# Patient Record
Sex: Female | Born: 1940 | Race: White | Hispanic: No | Smoking: Former smoker
Health system: Southern US, Community
[De-identification: ages and names within clinical notes are randomized; demographics above are authoritative.]

## PROBLEM LIST (undated history)

## (undated) ENCOUNTER — Observation Stay: Admission: AD | Payer: Medicare Other | Source: Ambulatory Visit | Admitting: Hematology

## (undated) DIAGNOSIS — I6523 Occlusion and stenosis of bilateral carotid arteries: Secondary | ICD-10-CM

## (undated) DIAGNOSIS — N202 Calculus of kidney with calculus of ureter: Secondary | ICD-10-CM

## (undated) DIAGNOSIS — Z8719 Personal history of other diseases of the digestive system: Secondary | ICD-10-CM

## (undated) DIAGNOSIS — E538 Deficiency of other specified B group vitamins: Secondary | ICD-10-CM

## (undated) DIAGNOSIS — K5730 Diverticulosis of large intestine without perforation or abscess without bleeding: Secondary | ICD-10-CM

## (undated) DIAGNOSIS — D591 Other autoimmune hemolytic anemias: Secondary | ICD-10-CM

## (undated) DIAGNOSIS — J449 Chronic obstructive pulmonary disease, unspecified: Secondary | ICD-10-CM

## (undated) DIAGNOSIS — D5912 Cold autoimmune hemolytic anemia: Secondary | ICD-10-CM

## (undated) HISTORY — PX: TONSILLECTOMY: SUR1361

## (undated) HISTORY — PX: CATARACT EXTRACTION W/ INTRAOCULAR LENS IMPLANT: SHX1309

---

## 1965-09-23 HISTORY — PX: APPENDECTOMY: SHX54

## 1982-09-23 HISTORY — PX: BREAST EXCISIONAL BIOPSY: SUR124

## 1984-09-23 HISTORY — PX: ABDOMINAL HYSTERECTOMY: SHX81

## 1998-02-27 ENCOUNTER — Ambulatory Visit (HOSPITAL_COMMUNITY): Admission: RE | Admit: 1998-02-27 | Discharge: 1998-02-27 | Payer: Self-pay | Admitting: *Deleted

## 1998-03-07 ENCOUNTER — Ambulatory Visit (HOSPITAL_COMMUNITY): Admission: RE | Admit: 1998-03-07 | Discharge: 1998-03-07 | Payer: Self-pay | Admitting: Neurology

## 2000-05-27 ENCOUNTER — Emergency Department (HOSPITAL_COMMUNITY): Admission: EM | Admit: 2000-05-27 | Discharge: 2000-05-27 | Payer: Self-pay | Admitting: Emergency Medicine

## 2003-09-17 ENCOUNTER — Inpatient Hospital Stay (HOSPITAL_COMMUNITY): Admission: EM | Admit: 2003-09-17 | Discharge: 2003-09-29 | Payer: Self-pay | Admitting: Emergency Medicine

## 2003-11-23 ENCOUNTER — Ambulatory Visit (HOSPITAL_COMMUNITY): Admission: RE | Admit: 2003-11-23 | Discharge: 2003-11-23 | Payer: Self-pay | Admitting: *Deleted

## 2003-11-23 ENCOUNTER — Encounter (INDEPENDENT_AMBULATORY_CARE_PROVIDER_SITE_OTHER): Payer: Self-pay | Admitting: Specialist

## 2004-02-24 ENCOUNTER — Ambulatory Visit (HOSPITAL_COMMUNITY): Admission: RE | Admit: 2004-02-24 | Discharge: 2004-02-24 | Payer: Self-pay | Admitting: *Deleted

## 2005-02-15 ENCOUNTER — Ambulatory Visit: Payer: Self-pay | Admitting: Oncology

## 2005-09-24 ENCOUNTER — Ambulatory Visit: Payer: Self-pay | Admitting: Oncology

## 2005-10-01 ENCOUNTER — Ambulatory Visit (HOSPITAL_COMMUNITY): Admission: RE | Admit: 2005-10-01 | Discharge: 2005-10-01 | Payer: Self-pay | Admitting: Oncology

## 2006-03-19 ENCOUNTER — Ambulatory Visit: Payer: Self-pay | Admitting: Oncology

## 2006-05-09 ENCOUNTER — Ambulatory Visit: Payer: Self-pay | Admitting: Oncology

## 2006-05-16 LAB — CBC WITH DIFFERENTIAL/PLATELET
Basophils Absolute: 0.1 10*3/uL (ref 0.0–0.1)
EOS%: 0.6 % (ref 0.0–7.0)
HCT: 33.5 % — ABNORMAL LOW (ref 34.8–46.6)
HGB: 11.7 g/dL (ref 11.6–15.9)
MCH: 36.7 pg — ABNORMAL HIGH (ref 26.0–34.0)
MCV: 104.9 fL — ABNORMAL HIGH (ref 81.0–101.0)
NEUT%: 49.9 % (ref 39.6–76.8)
Platelets: 354 10*3/uL (ref 145–400)
lymph#: 3.4 10*3/uL — ABNORMAL HIGH (ref 0.9–3.3)

## 2006-05-16 LAB — COMPREHENSIVE METABOLIC PANEL
CO2: 29 mEq/L (ref 19–32)
Creatinine, Ser: 0.47 mg/dL (ref 0.40–1.20)
Glucose, Bld: 94 mg/dL (ref 70–99)
Total Bilirubin: 2.5 mg/dL — ABNORMAL HIGH (ref 0.3–1.2)

## 2006-05-16 LAB — IRON AND TIBC
%SAT: 83 % — ABNORMAL HIGH (ref 20–55)
Iron: 311 ug/dL — ABNORMAL HIGH (ref 42–145)
TIBC: 376 ug/dL (ref 250–470)
UIBC: 65 ug/dL

## 2006-05-16 LAB — MORPHOLOGY

## 2006-05-16 LAB — LACTATE DEHYDROGENASE: LDH: 240 U/L (ref 94–250)

## 2007-05-31 ENCOUNTER — Emergency Department (HOSPITAL_COMMUNITY): Admission: EM | Admit: 2007-05-31 | Discharge: 2007-05-31 | Payer: Self-pay | Admitting: Emergency Medicine

## 2007-06-03 ENCOUNTER — Emergency Department (HOSPITAL_COMMUNITY): Admission: EM | Admit: 2007-06-03 | Discharge: 2007-06-03 | Payer: Self-pay | Admitting: Emergency Medicine

## 2007-06-19 ENCOUNTER — Emergency Department (HOSPITAL_COMMUNITY): Admission: EM | Admit: 2007-06-19 | Discharge: 2007-06-19 | Payer: Self-pay | Admitting: Emergency Medicine

## 2007-06-24 ENCOUNTER — Ambulatory Visit: Payer: Self-pay | Admitting: Oncology

## 2007-06-26 LAB — CBC WITH DIFFERENTIAL/PLATELET
Basophils Absolute: 0.2 10*3/uL — ABNORMAL HIGH (ref 0.0–0.1)
Eosinophils Absolute: 0 10*3/uL (ref 0.0–0.5)
HGB: 11.1 g/dL — ABNORMAL LOW (ref 11.6–15.9)
NEUT#: 5.7 10*3/uL (ref 1.5–6.5)
RDW: UNDETERMINED % (ref 11.3–14.5)
WBC: 11.3 10*3/uL — ABNORMAL HIGH (ref 3.9–10.0)
lymph#: 4.9 10*3/uL — ABNORMAL HIGH (ref 0.9–3.3)

## 2007-06-26 LAB — COMPREHENSIVE METABOLIC PANEL
ALT: 14 U/L (ref 0–35)
AST: 26 U/L (ref 0–37)
Albumin: 4.7 g/dL (ref 3.5–5.2)
Alkaline Phosphatase: 106 U/L (ref 39–117)
Calcium: 9 mg/dL (ref 8.4–10.5)
Chloride: 102 mEq/L (ref 96–112)
Creatinine, Ser: 0.5 mg/dL (ref 0.40–1.20)
Potassium: 4.4 mEq/L (ref 3.5–5.3)

## 2007-06-26 LAB — MORPHOLOGY: PLT EST: INCREASED

## 2007-07-14 ENCOUNTER — Encounter: Admission: RE | Admit: 2007-07-14 | Discharge: 2007-07-14 | Payer: Self-pay | Admitting: Oncology

## 2007-09-30 ENCOUNTER — Ambulatory Visit (HOSPITAL_COMMUNITY): Admission: RE | Admit: 2007-09-30 | Discharge: 2007-09-30 | Payer: Self-pay | Admitting: Oncology

## 2007-10-12 ENCOUNTER — Ambulatory Visit: Payer: Self-pay | Admitting: Oncology

## 2007-10-14 LAB — CBC WITH DIFFERENTIAL/PLATELET
BASO%: 1.8 % (ref 0.0–2.0)
EOS%: 0.6 % (ref 0.0–7.0)
HCT: UNDETERMINED % (ref 34.8–46.6)
MCH: UNDETERMINED pg (ref 26.0–34.0)
MCHC: UNDETERMINED g/dL (ref 32.0–36.0)
MCV: UNDETERMINED fL (ref 81.0–101.0)
MONO%: 5.5 % (ref 0.0–13.0)
NEUT%: 55 % (ref 39.6–76.8)
lymph#: 3.4 10*3/uL — ABNORMAL HIGH (ref 0.9–3.3)

## 2008-01-08 ENCOUNTER — Ambulatory Visit: Payer: Self-pay | Admitting: Oncology

## 2008-04-01 ENCOUNTER — Ambulatory Visit: Payer: Self-pay | Admitting: Oncology

## 2008-06-29 ENCOUNTER — Ambulatory Visit: Payer: Self-pay | Admitting: Oncology

## 2008-07-01 LAB — COMPREHENSIVE METABOLIC PANEL
AST: 25 U/L (ref 0–37)
Alkaline Phosphatase: 115 U/L (ref 39–117)
BUN: 16 mg/dL (ref 6–23)
Calcium: 8.5 mg/dL (ref 8.4–10.5)
Creatinine, Ser: 0.51 mg/dL (ref 0.40–1.20)
Total Bilirubin: 1.6 mg/dL — ABNORMAL HIGH (ref 0.3–1.2)

## 2008-07-01 LAB — MORPHOLOGY

## 2008-07-01 LAB — CBC WITH DIFFERENTIAL/PLATELET
BASO%: 1.7 % (ref 0.0–2.0)
Eosinophils Absolute: 0.1 10*3/uL (ref 0.0–0.5)
MCHC: UNDETERMINED g/dL (ref 32.0–36.0)
MONO#: 0.3 10*3/uL (ref 0.1–0.9)
NEUT#: 4.1 10*3/uL (ref 1.5–6.5)
RBC: UNDETERMINED 10*6/uL (ref 3.70–5.32)
WBC: 8.1 10*3/uL (ref 3.9–10.0)
lymph#: 3.5 10*3/uL — ABNORMAL HIGH (ref 0.9–3.3)

## 2008-07-15 ENCOUNTER — Encounter: Admission: RE | Admit: 2008-07-15 | Discharge: 2008-07-15 | Payer: Self-pay | Admitting: Oncology

## 2008-08-15 ENCOUNTER — Ambulatory Visit: Payer: Self-pay | Admitting: Oncology

## 2008-08-17 LAB — CBC WITH DIFFERENTIAL/PLATELET
BASO%: 1.1 % (ref 0.0–2.0)
LYMPH%: 34 % (ref 14.0–48.0)
MCHC: 36.8 g/dL — ABNORMAL HIGH (ref 32.0–36.0)
MONO#: 0.5 10*3/uL (ref 0.1–0.9)
MONO%: 5.9 % (ref 0.0–13.0)
Platelets: 473 10*3/uL — ABNORMAL HIGH (ref 145–400)
RBC: 2.88 10*6/uL — ABNORMAL LOW (ref 3.70–5.32)
RDW: 13.4 % (ref 11.3–14.5)
WBC: 8.3 10*3/uL (ref 3.9–10.0)

## 2009-02-10 ENCOUNTER — Ambulatory Visit: Payer: Self-pay | Admitting: Oncology

## 2009-02-14 LAB — CBC WITH DIFFERENTIAL/PLATELET
BASO%: 0.5 % (ref 0.0–2.0)
Eosinophils Absolute: 0 10*3/uL (ref 0.0–0.5)
HCT: UNDETERMINED % (ref 34.8–46.6)
LYMPH%: 28.4 % (ref 14.0–49.7)
MONO#: 0.3 10*3/uL (ref 0.1–0.9)
NEUT#: 8.6 10*3/uL — ABNORMAL HIGH (ref 1.5–6.5)
Platelets: ADEQUATE 10*3/uL (ref 145–400)
RBC: UNDETERMINED 10*6/uL (ref 3.70–5.45)
WBC: 12.6 10*3/uL — ABNORMAL HIGH (ref 3.9–10.3)
lymph#: 3.6 10*3/uL — ABNORMAL HIGH (ref 0.9–3.3)
nRBC: 0 % (ref 0–0)

## 2009-02-21 ENCOUNTER — Encounter: Admission: RE | Admit: 2009-02-21 | Discharge: 2009-02-21 | Payer: Self-pay | Admitting: *Deleted

## 2009-02-28 ENCOUNTER — Inpatient Hospital Stay (HOSPITAL_COMMUNITY): Admission: AD | Admit: 2009-02-28 | Discharge: 2009-03-04 | Payer: Self-pay | Admitting: *Deleted

## 2009-03-01 ENCOUNTER — Ambulatory Visit: Payer: Self-pay | Admitting: Oncology

## 2009-03-10 LAB — CBC & DIFF AND RETIC
BASO%: 1.2 % (ref 0.0–2.0)
EOS%: 0.7 % (ref 0.0–7.0)
HCT: UNDETERMINED % (ref 34.8–46.6)
LYMPH%: 34 % (ref 14.0–49.7)
MCH: UNDETERMINED pg (ref 25.1–34.0)
MCHC: UNDETERMINED g/dL (ref 31.5–36.0)
MCV: UNDETERMINED fL (ref 79.5–101.0)
MONO%: 5.4 % (ref 0.0–14.0)
NEUT%: 58.7 % (ref 38.4–76.8)
Platelets: 572 10*3/uL — ABNORMAL HIGH (ref 145–400)

## 2009-03-17 LAB — CBC & DIFF AND RETIC
Eosinophils Absolute: 0.1 10*3/uL (ref 0.0–0.5)
HCT: 27.2 % — ABNORMAL LOW (ref 34.8–46.6)
IRF: 0.35 — ABNORMAL HIGH (ref 0.130–0.330)
LYMPH%: 52.4 % — ABNORMAL HIGH (ref 14.0–49.7)
MONO#: 0.4 10*3/uL (ref 0.1–0.9)
NEUT#: 2.7 10*3/uL (ref 1.5–6.5)
NEUT%: 39.2 % (ref 38.4–76.8)
Platelets: 448 10*3/uL — ABNORMAL HIGH (ref 145–400)
Retic %: 1.7 % (ref 0.4–2.3)
WBC: 7 10*3/uL (ref 3.9–10.3)
lymph#: 3.7 10*3/uL — ABNORMAL HIGH (ref 0.9–3.3)

## 2009-04-06 ENCOUNTER — Ambulatory Visit: Payer: Self-pay | Admitting: Oncology

## 2009-04-10 LAB — CBC & DIFF AND RETIC
BASO%: 0.5 % (ref 0.0–2.0)
Immature Retic Fract: 9.5 % (ref 0.00–10.70)
LYMPH%: 38.4 % (ref 14.0–49.7)
MCHC: 35.7 g/dL (ref 31.5–36.0)
MONO#: 0.6 10*3/uL (ref 0.1–0.9)
Platelets: 390 10*3/uL (ref 145–400)
RBC: 2.56 10*6/uL — ABNORMAL LOW (ref 3.70–5.45)
WBC: 8.2 10*3/uL (ref 3.9–10.3)
nRBC: 0 % (ref 0–0)

## 2009-04-10 LAB — MORPHOLOGY

## 2009-04-11 ENCOUNTER — Ambulatory Visit: Admission: RE | Admit: 2009-04-11 | Discharge: 2009-04-11 | Payer: Self-pay | Admitting: Oncology

## 2009-04-11 ENCOUNTER — Ambulatory Visit: Payer: Self-pay | Admitting: Vascular Surgery

## 2009-04-11 ENCOUNTER — Encounter: Payer: Self-pay | Admitting: Oncology

## 2009-05-22 ENCOUNTER — Ambulatory Visit: Payer: Self-pay | Admitting: Oncology

## 2009-05-24 LAB — RETICULOCYTES (CHCC)
ABS Retic: 86.8 10*3/uL (ref 19.0–186.0)
RBC.: 2.8 MIL/uL — ABNORMAL LOW (ref 3.87–5.11)
Retic Ct Pct: 3.1 % (ref 0.4–3.1)

## 2009-05-24 LAB — CBC & DIFF AND RETIC
Basophils Absolute: 0.1 10*3/uL (ref 0.0–0.1)
EOS%: 0.5 % (ref 0.0–7.0)
Eosinophils Absolute: 0.1 10*3/uL (ref 0.0–0.5)
HGB: 9.9 g/dL — ABNORMAL LOW (ref 11.6–15.9)
NEUT#: 6.1 10*3/uL (ref 1.5–6.5)
RBC: UNDETERMINED 10*6/uL (ref 3.70–5.45)
RDW: UNDETERMINED % (ref 11.2–14.5)
lymph#: 4.3 10*3/uL — ABNORMAL HIGH (ref 0.9–3.3)

## 2009-06-28 ENCOUNTER — Ambulatory Visit: Payer: Self-pay | Admitting: Oncology

## 2009-06-30 LAB — CBC & DIFF AND RETIC
Basophils Absolute: 0.1 10*3/uL (ref 0.0–0.1)
Eosinophils Absolute: 0.1 10*3/uL (ref 0.0–0.5)
HCT: 28.6 % — ABNORMAL LOW (ref 34.8–46.6)
HGB: 10.2 g/dL — ABNORMAL LOW (ref 11.6–15.9)
Immature Retic Fract: 9.9 % (ref 0.00–10.70)
MCH: 39.7 pg — ABNORMAL HIGH (ref 25.1–34.0)
MONO#: 0.7 10*3/uL (ref 0.1–0.9)
NEUT#: 3.7 10*3/uL (ref 1.5–6.5)
NEUT%: 46.3 % (ref 38.4–76.8)
Retic %: 3.35 % — ABNORMAL HIGH (ref 0.50–1.50)
Retic Ct Abs: 86.1 10*3/uL — ABNORMAL HIGH (ref 18.30–72.70)
WBC: 8 10*3/uL (ref 3.9–10.3)
lymph#: 3.5 10*3/uL — ABNORMAL HIGH (ref 0.9–3.3)

## 2009-07-17 ENCOUNTER — Encounter: Admission: RE | Admit: 2009-07-17 | Discharge: 2009-07-17 | Payer: Self-pay | Admitting: Oncology

## 2009-10-27 ENCOUNTER — Ambulatory Visit: Payer: Self-pay | Admitting: Oncology

## 2009-10-31 LAB — CBC WITH DIFFERENTIAL/PLATELET
Basophils Absolute: 0.1 10*3/uL (ref 0.0–0.1)
EOS%: 0.8 % (ref 0.0–7.0)
HCT: 27.6 % — ABNORMAL LOW (ref 34.8–46.6)
HGB: 10 g/dL — ABNORMAL LOW (ref 11.6–15.9)
LYMPH%: 35.5 % (ref 14.0–49.7)
MCH: 39.5 pg — ABNORMAL HIGH (ref 25.1–34.0)
MCHC: 36.2 g/dL — ABNORMAL HIGH (ref 31.5–36.0)
MCV: 109.3 fL — ABNORMAL HIGH (ref 79.5–101.0)
MONO%: 5.4 % (ref 0.0–14.0)
NEUT%: 57.3 % (ref 38.4–76.8)
Platelets: 398 10*3/uL (ref 145–400)
lymph#: 3.2 10*3/uL (ref 0.9–3.3)

## 2009-10-31 LAB — COMPREHENSIVE METABOLIC PANEL
AST: 27 U/L (ref 0–37)
BUN: 13 mg/dL (ref 6–23)
Calcium: 9.1 mg/dL (ref 8.4–10.5)
Chloride: 99 mEq/L (ref 96–112)
Creatinine, Ser: 0.52 mg/dL (ref 0.40–1.20)
Total Bilirubin: 2.8 mg/dL — ABNORMAL HIGH (ref 0.3–1.2)

## 2010-04-30 ENCOUNTER — Ambulatory Visit: Payer: Self-pay | Admitting: Oncology

## 2010-05-02 LAB — CBC WITH DIFFERENTIAL/PLATELET
Eosinophils Absolute: 0.1 10*3/uL (ref 0.0–0.5)
HCT: 31.8 % — ABNORMAL LOW (ref 34.8–46.6)
LYMPH%: 40.6 % (ref 14.0–49.7)
MCV: 103.2 fL — ABNORMAL HIGH (ref 79.5–101.0)
MONO#: 0.8 10*3/uL (ref 0.1–0.9)
MONO%: 8.3 % (ref 0.0–14.0)
NEUT#: 4.8 10*3/uL (ref 1.5–6.5)
NEUT%: 49.9 % (ref 38.4–76.8)
Platelets: 369 10*3/uL (ref 145–400)
WBC: 9.6 10*3/uL (ref 3.9–10.3)

## 2010-07-23 ENCOUNTER — Encounter: Admission: RE | Admit: 2010-07-23 | Discharge: 2010-07-23 | Payer: Self-pay | Admitting: Oncology

## 2010-10-12 ENCOUNTER — Other Ambulatory Visit: Payer: Self-pay

## 2010-11-09 ENCOUNTER — Encounter (HOSPITAL_BASED_OUTPATIENT_CLINIC_OR_DEPARTMENT_OTHER): Payer: Medicare Other | Admitting: Oncology

## 2010-11-09 ENCOUNTER — Other Ambulatory Visit: Payer: Self-pay | Admitting: Oncology

## 2010-11-09 DIAGNOSIS — K8020 Calculus of gallbladder without cholecystitis without obstruction: Secondary | ICD-10-CM

## 2010-11-09 DIAGNOSIS — D649 Anemia, unspecified: Secondary | ICD-10-CM

## 2010-11-09 DIAGNOSIS — D591 Autoimmune hemolytic anemia, unspecified: Secondary | ICD-10-CM

## 2010-11-09 LAB — CBC WITH DIFFERENTIAL/PLATELET
BASO%: 1 % (ref 0.0–2.0)
HCT: UNDETERMINED % (ref 34.8–46.6)
LYMPH%: 41.9 % (ref 14.0–49.7)
MCHC: UNDETERMINED g/dL (ref 31.5–36.0)
MCV: UNDETERMINED fL (ref 79.5–101.0)
MONO#: 0.5 10*3/uL (ref 0.1–0.9)
MONO%: 6.9 % (ref 0.0–14.0)
NEUT%: 49.5 % (ref 38.4–76.8)
Platelets: 420 10*3/uL — ABNORMAL HIGH (ref 145–400)
RBC: UNDETERMINED 10*6/uL (ref 3.70–5.45)

## 2010-11-09 LAB — COMPREHENSIVE METABOLIC PANEL
ALT: 16 U/L (ref 0–35)
Alkaline Phosphatase: 124 U/L — ABNORMAL HIGH (ref 39–117)
CO2: 30 mEq/L (ref 19–32)
Creatinine, Ser: 0.49 mg/dL (ref 0.40–1.20)
Glucose, Bld: 96 mg/dL (ref 70–99)
Sodium: 140 mEq/L (ref 135–145)
Total Bilirubin: 2.4 mg/dL — ABNORMAL HIGH (ref 0.3–1.2)
Total Protein: 6.3 g/dL (ref 6.0–8.3)

## 2010-12-31 LAB — BASIC METABOLIC PANEL
BUN: 3 mg/dL — ABNORMAL LOW (ref 6–23)
CO2: 28 mEq/L (ref 19–32)
CO2: 28 mEq/L (ref 19–32)
Calcium: 7.4 mg/dL — ABNORMAL LOW (ref 8.4–10.5)
GFR calc Af Amer: >60 mL/min (ref >60)
GFR calc non Af Amer: >60 mL/min (ref >60)
GFR calc non Af Amer: >60 mL/min (ref >60)
Glucose, Bld: 89 mg/dL (ref 70–99)
Potassium: 3.3 mEq/L — ABNORMAL LOW (ref 3.5–5.1)
Sodium: 135 mEq/L (ref 135–145)
Sodium: 137 mEq/L (ref 135–145)

## 2010-12-31 LAB — CROSSMATCH: ABO/RH(D): B POS

## 2010-12-31 LAB — HEPATIC FUNCTION PANEL
ALT: 10 U/L (ref 0–35)
AST: 20 U/L (ref 0–37)
Albumin: 2.9 g/dL — ABNORMAL LOW (ref 3.5–5.2)
Alkaline Phosphatase: 77 U/L (ref 39–117)
Total Bilirubin: 2.3 mg/dL — ABNORMAL HIGH (ref 0.3–1.2)
Total Protein: 5.5 g/dL — ABNORMAL LOW (ref 6.0–8.3)

## 2010-12-31 LAB — CBC
HCT: 12.6 % — ABNORMAL LOW (ref 36.0–46.0)
HCT: 18 % — ABNORMAL LOW (ref 36.0–46.0)
HCT: 18.8 % — ABNORMAL LOW (ref 36.0–46.0)
Hemoglobin: 4.8 g/dL — CL (ref 12.0–15.0)
Hemoglobin: 7.5 g/dL — CL (ref 12.0–15.0)
Hemoglobin: 8.3 g/dL — ABNORMAL LOW (ref 12.0–15.0)
MCHC: 34.3 g/dL (ref 30.0–36.0)
MCHC: 35.7 g/dL (ref 30.0–36.0)
MCV: 110.5 fL — ABNORMAL HIGH (ref 78.0–100.0)
Platelets: 580 10*3/uL — ABNORMAL HIGH (ref 150–400)
Platelets: 638 10*3/uL — ABNORMAL HIGH (ref 150–400)
RBC: 1.04 MIL/uL — ABNORMAL LOW (ref 3.87–5.11)
RBC: 1.55 MIL/uL — ABNORMAL LOW (ref 3.87–5.11)
RBC: 1.7 MIL/uL — ABNORMAL LOW (ref 3.87–5.11)
RBC: 2.1 MIL/uL — ABNORMAL LOW (ref 3.87–5.11)
RDW: 27.6 % — ABNORMAL HIGH (ref 11.5–15.5)
WBC: 15.1 10*3/uL — ABNORMAL HIGH (ref 4.0–10.5)
WBC: 15.9 10*3/uL — ABNORMAL HIGH (ref 4.0–10.5)

## 2010-12-31 LAB — COMPREHENSIVE METABOLIC PANEL
ALT: 9 U/L (ref 0–35)
AST: 14 U/L (ref 0–37)
Alkaline Phosphatase: 70 U/L (ref 39–117)
Alkaline Phosphatase: 82 U/L (ref 39–117)
BUN: 4 mg/dL — ABNORMAL LOW (ref 6–23)
CO2: 28 mEq/L (ref 19–32)
CO2: 34 mEq/L — ABNORMAL HIGH (ref 19–32)
Calcium: 8.2 mg/dL — ABNORMAL LOW (ref 8.4–10.5)
Chloride: 106 mEq/L (ref 96–112)
Creatinine, Ser: 0.53 mg/dL (ref 0.4–1.2)
GFR calc non Af Amer: >60 mL/min (ref >60)
GFR calc non Af Amer: >60 mL/min (ref >60)
Glucose, Bld: 96 mg/dL (ref 70–99)
Potassium: 2.2 mEq/L — CL (ref 3.5–5.1)
Potassium: 3.7 mEq/L (ref 3.5–5.1)
Sodium: 136 mEq/L (ref 135–145)
Total Bilirubin: 1.3 mg/dL — ABNORMAL HIGH (ref 0.3–1.2)

## 2010-12-31 LAB — CULTURE, BLOOD (ROUTINE X 2)

## 2010-12-31 LAB — IRON AND TIBC: UIBC: <55 ug/dL

## 2010-12-31 LAB — TSH: TSH: 2.55 u[IU]/mL (ref 0.350–4.500)

## 2010-12-31 LAB — BILIRUBIN, FRACTIONATED(TOT/DIR/INDIR): Indirect Bilirubin: 1.3 mg/dL — ABNORMAL HIGH (ref 0.3–0.9)

## 2010-12-31 LAB — HEMOCCULT GUIAC POC 1CARD (OFFICE): Fecal Occult Bld: NEGATIVE

## 2010-12-31 LAB — RETICULOCYTES: Retic Count, Absolute: 99.8 10*3/uL (ref 19.0–186.0)

## 2010-12-31 LAB — PROTIME-INR
INR: 1.3 (ref 0.00–1.49)
Prothrombin Time: 16.3 seconds — ABNORMAL HIGH (ref 11.6–15.2)

## 2010-12-31 LAB — DIFFERENTIAL
Basophils Absolute: 0 10*3/uL (ref 0.0–0.1)
Lymphocytes Relative: 30 % (ref 12–46)
Monocytes Relative: 8 % (ref 3–12)

## 2011-02-05 NOTE — H&P (Signed)
[name], [name]          ACCOUNT NO.:  [id number]   MEDICAL RECORD NO.:  [id number]          PATIENT TYPE:  INP   LOCATION:  1510                         FACILITY:  [facility]   PHYSICIAN:  Zannie Cove, MD     DATE OF BIRTH:  09-26-1940   DATE OF ADMISSION:  02/28/2009  DATE OF DISCHARGE:                              HISTORY & PHYSICAL   PRIMARY CARE PHYSICIAN:  Soyla Murphy. Renne Crigler, MD.   GASTROENTEROLOGY PHYSICIAN:  Georgiana Spinner, MD.   HEMATOLOGIST:  Juanita Craver. Darrold Span, MD.   See the history and physical.   CHIEF COMPLAINT:  Sent from the GI doctor's office due to severe anemia.   HISTORY OF PRESENTING ILLNESS:  [name] is a 70 year old female,  who was diagnosed with diverticulitis on June 1st when she initially  presented with left lower quadrant pain, had been treated with oral  antibiotics, ciprofloxacin and Flagyl, by Dr. Virginia Rochester following which her  symptoms improved significantly and almost completely resolved to the  point that she has been tolerating and eating solid food now.  This is  despite having found 2 small diverticular abscesses in addition to  diverticulitis on the initial CAT scan on February 21, 2009.  She denies any  fevers or chills.  Today, Dr. Sabino Gasser got the results of the lab  tests, which showed a hemoglobin of 6.7 and platelets of greater than  1,000,000 and, hence, referred the patient to the hospital as a direct  admit.   Patient denies any bleeding, rectally or vaginally, denies any  hematemesis.  She reports tiredness since Friday, but denies chest pain,  shortness of breath, dizziness, and no new medications except for the  antibiotics started recently.   PAST MEDICAL HISTORY:  1. Diverticulosis.  2. Cold hemoagglutinin disease.  3. Diverticulitis and diverticular abscesses status post drainage in      the past.   PAST SURGICAL HISTORY:  1. Hysterectomy.  2. Appendectomy.   MEDICATIONS:  1. Ciprofloxacin 500 twice a day.  2.  Folic acid 1 mg once a day.  3. Flagyl 500 twice a day.   ALLERGIES:  She is INTOLERANT TO IBUPROFEN.   SOCIAL HISTORY:  Divorced, lives at home by herself, is a smoker, smokes  1 pack-per-day for the past 50 years, no alcohol history.   FAMILY HISTORY:  No GI cancers or polyps.  No hematological  malignancies.   REVIEW OF SYSTEMS:  Twelve systems reviewed and negative except per HPI.   PHYSICAL EXAMINATION:  VITAL SIGNS:  Temp is 99.0, pulse is 88, blood  pressure 97/60, respirations 16, sating 99% on room air.  GENERAL:  She is alert, awake, oriented x3 in no acute distress.  HEENT:  Pupils are equal and reactive to light, some mid skin pallor  present, extraocular movements intact.  NECK:  No JVD or lymphadenopathy appreciated.  CARDIOVASCULAR:  S1 S2, regular rate, rhythm.  LUNGS:  Clear to auscultation bilaterally.  ABDOMEN:  Soft and nontender with positive bowel sounds.  No rigidity or  rebound, no organomegaly.  EXTREMITIES:  No edema, clubbing, cyanosis.   LABORATORY DATA:  Pending awaiting the fax from Dr. Pricilla Loveless office.  CT abdomen and pelvis from February 21, 2009, shows sigmoid diverticulosis  and diverticulitis with multiple developing abscesses.   ASSESSMENT AND PLAN:  A 70 year old female with:  1. Resolving diverticulitis.  Continue ciprofloxacin and metronidazole      for a total of 7 days.  A clear liquid diet for now, advance as      tolerated.  Would repeat a computerized tomography (CT) abdomen and      pelvis to reevaluate the status of the colon as well as evaluate      the abscesses.  If still persistent or larger, may require      surgical/drainage by Interventional Radiology.  Intravenous (IV)      fluids normal saline at 75 mL an hour with a blood warmer.  2. Anemia.  Likely secondary to cold hemagglutinin disease worsened by      recent inflammation.  Will type and cross and transfuse 2 units      packed red blood cells with a blood warmer.   Continue Foley      catheter at 1 mg per day.  Check a peripheral smear.  Check an      hemolysis panel.  Dr. Darrold Span has been consulted.  3. Thrombocytosis.  Likely reactive.  Repeat CBC and check peripheral      smear to confirm the same.  4. Deep venous thrombosis (DVT) prophylaxis with Lovenox 40 mg      subcutaneously daily.  5. Code Status.  Patient is a full code.      Zannie Cove, MD  Electronically Signed     PJ/MEDQ  D:  02/28/2009  T:  02/28/2009  Job:  [id number]   cc:   Soyla Murphy. Renne Crigler, M.D.  Fax: [phone]   Lennis P. Darrold Span, M.D.  Fax: [phone]

## 2011-02-05 NOTE — Discharge Summary (Signed)
[name], [name]          ACCOUNT NO.:  [id number]   MEDICAL RECORD NO.:  [id number]          PATIENT TYPE:  INP   LOCATION:  1510                         [facility]   PHYSICIAN:  Michelene Gardener, MD    DATE OF BIRTH:  Dec 26, 1940   DATE OF ADMISSION:  02/28/2009  DATE OF DISCHARGE:  03/04/2009                               DISCHARGE SUMMARY   DISCHARGE DIAGNOSES:  1. 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  2. Anemia with cold hemagglutinin disease.  3. Reactive thrombocytosis.  4. Leukocytosis secondary to diverticulitis.  5. Mild hypokalemia.   DISCHARGE MEDICATIONS:  1. Ciprofloxacin 500 mg p.o. twice daily for 2 weeks.  2. Flagyl 500 mg three times daily for 2 weeks.  3. Folic acid 1 mg once a day.   CONSULTATIONS:  1. GI consult.  2. Surgical consult.  3. Interventional radiology consult.  4. Hematology consult.   PROCEDURES:  None.   RADIOLOGY STUDIES:  1. CT scan of the abdomen with contrast on June 20 showed no acute      abdominal findings.  2. CT scan of the pelvis with contrast on June 10 showed persistent      diverticulitis and enlarging diverticular abscess.  3. CT scan of the pelvis without contrast on June 10 showed no      drainable abscess identified in the pelvis.   FOLLOWUP:  1. Soyla Murphy. Renne Crigler, M.D. in 1-2 weeks.  2. Georgiana Spinner, M.D. in 1-2 weeks.   HOSPITAL COURSE:  1. Diverticulitis with diverticular abscess.  This patient was      admitted to the hospital for further evaluation.  CT scan of the      abdomen and pelvis was done June 9 and showed findings consistent      with diverticulitis and diverticular abscess.  The patient was      evaluated by Dr. Sabino Gasser, by general surgery and by      interventional radiology.  Decision was to do a CT guided      aspiration of the diverticular abscess.  Meanwhile the patient was      kept on ciprofloxacin and Flagyl IV.  She was kept n.p.o. and      received IV  fluids and pain medications.  The patient was taken for      CT guided drainage on June 10 but at that time the CT did not show      drainable abscess and it showed very good improvement since the      last CT.  So the patient was brought down without any drainage.  As      a matter of fact, the patient did great during this      hospitalization.  She responded well to medical treatment.  Her      diet advanced and she tolerated well.  At the time of discharge      there is no nausea, no vomiting, no abdominal pain.  She will be      discharged on 2 weeks of Cipro and Flagyl.  She is tolerating a  regular diet very well.  She will follow with Dr. Sabino Gasser as an      outpatient.  2. Anemia.  This is secondary to her known cold agglutinin disease.      The patient was seen by her hematologist during this      hospitalization.  She was transfused 4 units of packed RBC and then      repeat hemoglobin was 7.5 so she was transfused 1 more unit of      packed RBC.  Her last hemoglobin was 9.2.  3. Reactive thrombocytosis.  That will be followed as an outpatient by      her hematologist.  4. Leukocytosis.  There is no evidence of infection and the patient is      not needing any antibiotics.   Otherwise, other medical conditions remained stable.  The patient will  be discharged today in a stable condition.  Will follow up with her  primary doctor and with her hematologist and with her  gastroenterologist.  Total assessment time is 40 minutes.      Michelene Gardener, MD  Electronically Signed     NAE/MEDQ  D:  03/04/2009  T:  03/04/2009  Job:  [id number]

## 2011-02-05 NOTE — Consult Note (Signed)
[name], [name]          ACCOUNT NO.:  [id number]   MEDICAL RECORD NO.:  [id number]          PATIENT TYPE:  INP   LOCATION:  1510                         FACILITY:  [facility]   PHYSICIAN:  [name], M.D.DATE OF BIRTH:  May 27, 1941   DATE OF CONSULTATION:  03/01/2009  DATE OF DISCHARGE:                                 CONSULTATION   HEMATOLOGY/ONCOLOGY CONSULTATION   REASON FOR CONSULTATION/HISTORY OF PRESENT ILLNESS:  I appreciate Dr.  Virginia Cunningham and Dr. Jomarie Cunningham letting me know of the patient's admission.  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  She has not been known to have any  underlying malignancy and hemolysis has continued chronically.  She  tends to be only sporadically compliant with oral folate and her usual  good hemoglobin is about 10.5.  She was last seen in our office on Feb 14, 2009, then with a white count of 12.6, hemoglobin 9.8, platelets  clumped but adequate.  She had no abdominal complaints that day,  however, began with low abdominal pain on Feb 15, 2009.  She has known  diverticular disease and has had several previous episodes of acute  diverticulitis.  Because of the Memorial Day holiday, she was not seen  by Dr. Virginia Cunningham until February 21, 2009.  CBC, because of the cold agglutinins,  was difficult to result, had to be redrawn and finally the result got to  Dr. Virginia Cunningham on the day of admission, hemoglobin 6.7 and platelet's greater  than a million.  The patient had not seen any bright red blood per  rectum or any black, tarry stools.  P.o.'s have been very limited and  she did not keep up with her oral folate while on the oral Cipro and  Flagyl from Dr. Virginia Cunningham.  She had been extremely weak, particularly on February 24, 2009 and February 25, 2009.  The abdominal pain has been better and she  has had liquid stools for the past few days.  She is an ongoing smoker.   PHYSICAL EXAMINATION:  GENERAL:  She  is up in the room with clothes and  floor soiled with diarrhea.  She is alert, appropriate, very pleasant,  not in any acute discomfort.  Second unit of packed red blood cells is  infusing without difficulty.  VITAL SIGNS:  Temperature 98.6, heart rate 75 and regular, respirations  18, blood pressure 83/45, which is lower than her usual.  HEENT:  Mouth is clear and moist.  LUNGS:  Without wheezes or rales.  HEART:  Has a regular rate and rhythm.  ABDOMEN:  Soft.  She has active bowel sounds.  It is slightly distended.  EXTREMITIES:  No edema or cords.   LABORATORY DATA:  Labs from February 28, 2009 at 2045: White count 16.4,  hemoglobin 4.8, platelet count 545,000 with atypical lymphs and  polychromasia noted.  Haptoglobin less than 6.  Total bilirubin 1.8.  Direct bilirubin 0.5.  Sodium 136, potassium 2.2 and she is receiving  potassium lines.  Chloride 90, CO2 34, glucose 96, BUN  15, creatinine  0.7.  Alkaline phosphatase 82, SGOT 20, SGPT 9, total protein 6, albumin  3.2. Calcium is 8.2.  TSH 2.5.  B12 569.  Iron and Hemoccult's ordered.   Cross match showed nonspecific cold antibody with DAT IgG negative.   CT repeated last P.M. reportedly shows lung bases clear, no adenopathy  or splenomegaly.  Still diverticular abscess, which is slightly  increased in size.   IMPRESSION AND RECOMMENDATIONS:  1. 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      She feels noticeably better since the first unit of packed red      blood cells, with the second unit in process without problems.      Suggest following the CBC's for now as hopefully she will not need      other systemic intervention (which could be Rituxan or oral      alkylators).  Blood warmer in use and she needs a warmer for all IV      fluids as well as keeping the room temperature higher than normal.      Would Hemoccult check her stools and  I have also ordered iron      studies.  I believe that the platelet count greater than a million      prior to admission may have been an artifact.  2. Diverticular disease with abscess.  Per Gastroenterology.  3. Long and ongoing tobacco and I have written for p.r.n. Nicotine      patch.  4. Hypokalemia, probably related to the diarrhea, being supplemented.  5. Blood pressure lower than her usual.  Per primary service.      [name], M.D.  Electronically Signed     LPL/MEDQ  D:  03/01/2009  T:  03/01/2009  Job:  [id number]   cc:   [name]. [name], M.D.  Fax: [phone]

## 2011-02-08 NOTE — Consult Note (Signed)
NAME:  [name], [name]                    ACCOUNT NO.:  [id number]   MEDICAL RECORD NO.:  [id number]                   PATIENT TYPE:  INP   LOCATION:  0382                                 FACILITY:  [facility]   PHYSICIAN:  Lennis P. Darrold Span, M.D.             DATE OF BIRTH:  04-Apr-1941   DATE OF CONSULTATION:  09/20/2003  DATE OF DISCHARGE:                                   CONSULTATION   Patient is a 70 year old white female seen in consultation for anemia at the  request of the Hospitalists.  Primary care is by Dr. Merri Brunette, whom she  tells me saw last about two years ago.  Patient has not been seeing any  other doctors regularly either.  She was admitted on September 17, 2003, with  several hour history of acute abdominal pain thought possibly diverticulitis  with abscess, associated with temperature of over 101 degrees.  She had had  a respiratory infection in late November which lasted several weeks and had  been just generally fatigued since then but had not had any severe abdominal  pain, no noted bleeding or change in her bowel habits, and no other  abdominal symptoms prior to the onset of this abdominal pain.  She does not  have any known history of anemia although she has not had any blood work  done probably at least in two years.  She had colonoscopy (and possibly  upper endoscopy?) by Dr. Sabino Gasser about six years ago, this done for  general screening purposes and reportedly had one colon polyp.  She is a  long and ongoing smoker, one pack daily, and tells me she is really not  interested in quitting.  She admits to occasional alcohol.   Workup thus far:  CT scan September 17, 2003, and repeat September 19, 2003,  shows changes in the region of the sigmoid colon with thickening of the wall  and fluid collection, possibly an abscess or loculated fluid, no obvious  adenopathy, some ascites and bilateral pleural effusions on the second scan.  She does not have any cultures  reported in the computer.  She has remained  febrile with T-max 101 degrees on September 19, 2003, though she is afebrile  today.  Chest x-ray on admission just chronic changes.  Laboratories from  September 17, 2003:  Hemoglobin of 8.9, white count 20.8, ANC 18.7, platelets  492, MCV of 102.  Serum iron less than 10, ferritin 346 (acute phase  reaction), RETIC is 9%, and B12 and folate normal.  Urinalysis showed large  hemoglobin, 0-2 white cells, many bacteria, no red cells reported.  BUN 6,  creatinine 0.5, total protein 6, albumin 3.3.  AST, ALT, alk phos within  normal limits.  Total bili 2.2, direct bili of 0.4.  LDH 427.  Haptoglobin  less than 6.  On September 19, 2003:  Hemoglobin down to 7.2 though I do not  have accurate I's and O's  for that period in the hospital.  She was  transfused with two units of packed red cells without difficulty.  DAT for  complement positive and cold agglutinin positive.  September 20, 2003, post-  transfusion hemoglobin up to 11.1, white count 14.2, ANC 11.9, platelets  402.   PAST MEDICAL HISTORY:  No known drug allergies though she is intolerant to  IBUPROFEN causing gastric irritation.  She is post hysterectomy and  exploratory laparotomy with an appendectomy.   FAMILY HISTORY:  Negative for cancer and heart disease.   SOCIAL HISTORY:  Divorced, lives alone, no children.  Admits to one pack of  cigarettes daily for years and occasional alcohol.  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  Also noted on this chart  she has worked in Engineering geologist.   PHYSICAL EXAMINATION:  She is a pleasant lady who looks her stated age not  in any acute discomfort lying supine in bed on room air.  She is alert and  appropriate, a good historian.  Last temperature recorded 98.7 degrees at 5  a.m.  Heart rate 80 and regular, respirations 18, blood pressure 120/57.  No  I's and O's available since admission.  IV is D-5 normal saline at 75.   HEENT:  Her mouth is moist and clear.  She is not icteric.  There is no  peripheral adenopathy.  LUNGS:  Diminished breath sounds throughout, otherwise clear.  HEART:  Regular rate and rhythm.  ABDOMEN:  Very distended, soft, quiet, slightly tender diffusely.  No  appreciable organomegaly or masses.  LOWER EXTREMITIES:  Without edema or cords.  NEUROLOGIC:  No focal neurologic findings.   IMPRESSION AND RECOMMENDATIONS:  1. Abdominal pain with changes in the sigmoid colon by CT, questionably     diverticulitis and abscess.  Continue antibiotics, consider a drainage     procedure.  I would not want to start steroids for a hemolytic process in     this setting if we can possibly avoid it.  2. Anemia, which is iron deficient as well as some evidence of hemolysis.     Certainly if the abdominal symptoms resolve without any surgical     intervention now, she will need a colonoscopy to workup the iron     deficiency.  She will also need iron replacement either orally or     intravenously at some point (nothing by mouth now).  Will add folate     intravenous and follow her counts as she did get a good increase in her     hemoglobin with the transfusion and does not appear symptomatic with the     present hemoglobin.  I wonder if the recent respiratory illness might     have been Mycoplasma inducing cold agglutinin.  3. Tobacco abuse, which we discussed.   Will follow.  Thank you for the consult.                                               Lennis P. Darrold Span, M.D.    LPL/MEDQ  D:  09/20/2003  T:  09/20/2003  Job:  [id number]   cc:   Soyla Murphy. Renne Crigler, M.D.  [address]  [place]  [place] [address]  Fax: [phone]   Currie Paris, M.D.  [address]  [place] [address]  Fax: [phone]

## 2011-02-08 NOTE — Op Note (Signed)
NAME:  [name], [name]                    ACCOUNT NO.:  [id number]   MEDICAL RECORD NO.:  [id number]                   PATIENT TYPE:  AMB   LOCATION:  ENDO                                 FACILITY:  [facility]   PHYSICIAN:  Georgiana Spinner, M.D.                 DATE OF BIRTH:  02-Apr-1941   DATE OF PROCEDURE:  11/23/2003  DATE OF DISCHARGE:                                 OPERATIVE REPORT   PROCEDURE:  Colonoscopy.   INDICATIONS:  Hemoccult positivity, colon polyps.   ANESTHESIA:  Demerol 40 mg, Versed 3 mg.   PROCEDURE:  With the patient mildly sedated in the left lateral decubitus  position, the Olympus videoscopic colonoscope was inserted into the rectum  and passed under direct vision through a very tortuous colon.  We reached  the cecum identified by base of cecum and ileocecal valve, both of which  were photographed.  From this point, the colonoscope was slowly withdrawn  after taking circumferential views of the colonic mucosa having to go back  and forth in the area of the transverse and sigmoid colons because of the  tight turns precluded really a good view of this area, although we felt we  saw most of the mucosa.  We pulled back all the way to the rectum eventually  which appeared normal on direct and showed hemorrhoids on retroflexed view.  The endoscope was straightened and withdrawn.  Patient's vital signs and  pulse oximetry remained stable.  Patient tolerated the procedure well  without apparent complications.   FINDINGS:  Extremely tortous colon with internal hemorrhoids, otherwise  unremarkable exam.   PLAN:  We will have patient follow up with me, as described in endoscopy  note.                                               Georgiana Spinner, M.D.    GMO/MEDQ  D:  11/23/2003  T:  11/23/2003  Job:  [id number]

## 2011-02-08 NOTE — H&P (Signed)
NAME:  [name], [name]                    ACCOUNT NO.:  [id number]   MEDICAL RECORD NO.:  [id number]                   PATIENT TYPE:  INP   LOCATION:  0101                                 FACILITY:  [facility]   PHYSICIAN:  Hettie Holstein, D.O.                 DATE OF BIRTH:  [birth year]   DATE OF ADMISSION:  09/17/2003  DATE OF DISCHARGE:                                HISTORY & PHYSICAL   PRIMARY CARE PHYSICIAN:  Dr. Merri Brunette.   GENERAL SURGEON:  Dr. Cyndia Bent.   GYNECOLOGIST:  Dr. Freddy Finner.   GASTROENTEROLOGIST:  Dr. Sabino Gasser.   CHIEF COMPLAINT:  Abdominal pain/right lower quadrant.   HISTORY OF PRESENTING ILLNESS:  This is a pleasant, 70 year old, Caucasian  female with no underlying chronic medical illnesses, who presents to [facility] Emergency Department today after experiencing severe right lower  quadrant pain at approximately 8 p.m. the evening prior to admission. She  endured this pain overnight. She lives at home alone. She felt as though  this was a kidney stone, though she has never had one of these in the past.  She stated that it did ease up a bit, but it had persisted. She describes it  as sharp and crampy, and subsequently she felt some radiation to her left  lower quadrant. She said this was not associated with diarrhea, nausea, or  vomiting.  She did pass some gas; otherwise, there were no alleviating  factors. She subsequently presented to the emergency department at which  time a CT scan of her abdomen was performed. She was noted to be febrile  with a temperature of 101.6, hemodynamically stable, had a white count of  20.8, was also noted to be anemic with an elevated MCV.  A CT scan revealed  diverticulosis with a possible abscess. She was evaluated by Dr. Jamey Ripa in  the emergency department, and it was felt as though she needed to be  admitted for IV antibiotics with plans to re-assess response to medical  therapy on Monday via CT scan  and clinical monitoring, so at this time she  is being admitted to the medical service.   PAST MEDICAL HISTORY:  1. Post-menopausal vasomotor symptoms.  2. 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  3. History of appendectomy in 1967.  4. Tonsillectomy at age 70.  5. Six years ago, colonoscopy and endoscopy performed by Dr. Virginia Rochester with     findings of a single polyp.   No prior history of diabetes, hypertension, heart disease, or lung disease.  She does have chronic tobacco abuse, longstanding.   MEDICATIONS:  She takes no medications regularly.   ALLERGIES:  She reports an allergy to IBUPROFEN. She states that this causes  severe abdominal pain.   SOCIAL HISTORY:  She is divorced. She works as a Academic librarian. She smokes one  pack of cigarettes  a day x50 years. She drinks occasional alcohol. She  currently lives alone, has no children.   FAMILY HISTORY:  Significant for a mother who is age 51, alive and well,  with only diverticulitis. She has no history of cancer. Her father is 56  with prostatomegaly, status post surgery with no findings of cancers. She  states that she has two sisters who are alive and well.   REVIEW OF SYSTEMS:  She denies fever, chills. She does report vasomotor  symptoms that are unchanged. She denies any chest pain or shortness of  breath, PND, orthopnea. She denies any productive cough.  No hematochezia or  melena. No nausea, vomiting, or diarrhea. She reported only some nausea  recently. She feels that it is due to not eating. She denies any overt  weight loss that she can quantify. She did state that she had one episode of  blood-tinged urine that was transient. This was a couple of weeks ago. She  sought no medical evaluation for this.  She denies musculoskeletal  complaints. She does not exercise regularly but maintains an active  lifestyle, doing her own yard work.   LABORATORY AND RADIOLOGIC DATA:   CT scan as noted above revealed edematous  sigmoid colon with adjacent fluid collection posteriorly most consistent  with sigmoid diverticulitis, and an abscess of 7.5 x 17 mm. She had a white  count of 20.8, a hemoglobin of 8.9, hematocrit of 25.8, MCV of 102.2,  platelet count of 492.  Initial CK total of 19. Her sodium was 133,  potassium 3.2, chloride 100, carbon dioxide 29, glucose 112, BUN 6,  creatinine 0.5, calcium 8.3. Urinalysis revealed, on the initial dipstick,  positive for hemoglobin.  The microscopic is not available at this time.   PHYSICAL EXAMINATION:  VITAL SIGNS: Blood pressure 113/68, heart rate 92,  respirations 18, 02 saturation 100%, temp 101.6.  GENERAL: This is a very pleasant Caucasian female, well-appearing, non-  cachectic in appearance.  HEENT: She exhibits no conjunctival pallor. No jaundice.  NECK: Supple and nontender. No palpable lymphadenopathy.  Throat was clear  without exudate.  CHEST: Diminished breath sounds throughout. No wheezes or rhonchi.  HEART: S1 and S2 without murmur or rub.  ABDOMEN: Soft, slightly distended, with exquisite right lower quadrant  tenderness to deep palpation. She exhibited no peritoneal signs or rebound.  She exhibited normoactive bowel sounds. No suprapubic or costovertebral  angle tenderness.  RECTAL EXAM: Hemoccult-negative brown stool.  EXTREMITIES: No edema with good pulses.  SKIN: Warm and dry.  NEUROLOGIC: Intact. The patient's mood is euthymic. Her affect is stable.   IMPRESSION:  1. Diverticulitis with possible abscess and sigmoid colon edema.  2. Megaloblastic anemia.  3. Hypokalemia.  4. Fever and leukocytosis.  5. Tobacco dependence.   At the time, we are going to admit [name] to a non-telemetry floor,  observe her closely on IV antibiotics starting with Unasyn 3 gm IV q.6h. and  we are going to follow her response to this therapy. Anticipate repeating a CT scan of her abdomen and pelvis on Monday  as directed by Dr. Jamey Ripa, and  we will follow his recommendations  and work up as well her anemia, checking a B12, folate, iron, TIBC, and  ferritin. We are also going to check TSH, LFTs, and CEA as well. We are  going to check baseline EKG, PA and lateral chest, and follow up with BMP  and CBC in the morning. We will keep her NPO.  Hettie Holstein, D.O.    ESS/MEDQ  D:  09/17/2003  T:  09/17/2003  Job:  [id number]

## 2011-02-08 NOTE — Discharge Summary (Signed)
NAME:  [name], [name] NO.:  [id number]   MEDICAL RECORD NO.:  [id number]                   PATIENT TYPE:  INP   LOCATION:  4332                                 FACILITY:  [facility]   PHYSICIAN:  Hettie Holstein, D.O.                 DATE OF BIRTH:  07/01/1941   DATE OF ADMISSION:  09/17/2003  DATE OF DISCHARGE:  09/29/2003                                 DISCHARGE SUMMARY   PRIMARY CARE PHYSICIAN:  Soyla Murphy. Renne Crigler, M.D.   SURGEON:  Currie [place], M.D.   GYNECOLOGISTFreddy Finner, M.D.   GASTROENTEROLOGIST:  Georgiana Spinner, M.D.   ADMISSION DIAGNOSES:  Diverticulitis with possible abscess in sigmoid colon,  surrounding edema, fever, and leukocytosis.   DISCHARGE DIAGNOSES:  1. Diverticular abscess, status post percutaneous abscess drain placement     per interventional radiology on September 27, 2003.  2. Resolved fever.  3. Tobacco dependence.  4. Hypoalbuminemia.  5. Peripheral edema.   DISCHARGE MEDICATIONS:  1. Augmentin 875 mg p.o. b.i.d. x3 weeks, last dose to be given on October 19, 2003.  2. Ensure one can b.i.d.  3. Resource nutritional supplement 240 mL t.i.d.  4. Ferrous gluconate 324 mg one daily.   DISPOSITION:  The patient will follow up with Dr. Virginia Rochester in two to three weeks  and Dr. Renne Crigler in one week.  She is instructed to call [phone] for follow-  up, and she was encouraged to have outpatient colonoscopy for evaluation of  anemia.   HISTORY OF PRESENT ILLNESS:  This is a pleasant 70 year old Caucasian female  with no underlying chronic medical illnesses, who presented to [facility]  emergency department after experiencing severe right lower quadrant pain at  approximately 8 p.m. the evening prior to admission.  She endured this pain  overnight, lives at home overnight.  She thought as though this was a kidney  stone, though she has never had one of these in the past.  She stated that  it did ease up a bit but it had  persisted until now.  She described it as  sharp and crampy, and subsequently she felt some radiation to her left lower  quadrant.  She said that this was not associated with diarrhea, nausea, or  vomiting.  She did pass some gas.  Otherwise there were no alleviating  factors.  She subsequently presented to the emergency department, at which  time a CT scan of the abdomen was performed.  She was noted to be febrile  with a temperature of 101.6, hemodynamically stable, and a white count of  20.8, was also noted to be anemic with an elevated MCV.  CT scan revealed  diverticulosis with a possible abscess.  She was evaluated by Dr. Jamey Ripa in  the emergency department, and it was felt as though she needed to be  admitted for IV antibiotics with plan to reassess  response to medical  therapy on Monday via CT scan and clinical monitoring.  So at this time she  was admitted to the medical service.   HOSPITAL COURSE:  The patient was admitted to a medical-surgical floor,  started on IV Unasyn and IV fluids and bowel rest.  Her course was of slow  improvement with regard to persistent leukocytosis and low-grade  temperature.  She was seen by hematology/oncology, who recommended the  patient undergo GI evaluation.  Her course was of slow improvement.  She was  started on IV iron.  A percutaneous drain was placed by interventional  radiology and remained until day of discharge without complications, no  remarkable growth of fluid collection noted and minimal drainage.  Radiology  discussed with surgeon, Dr. Jamey Ripa, removal of the drain, and this was done  without complications.  The patient was stable for discharge.  White count  had trended down to 13.9.  She was afebrile and follow-up as noted above was  recommended, and the patient was instructed to return if further problems  developed.                                               Hettie Holstein, D.O.    ESS/MEDQ  D:  10/17/2003  T:   10/18/2003  Job:  [id number]   cc:   Soyla Murphy. Renne Crigler, M.D.  [address]  [place]  [place] [address]  Fax: [phone]   Currie [place], M.D.  [address]  [place]  [place] [address]  Fax: [phone]   Georgiana Spinner, M.D.  [address]  [place]  [place] [address]  Fax: [phone]   Lennis P. Darrold Span, M.D.  [address] [facility]  [place]  [place] [address]  Fax: [phone]

## 2011-02-08 NOTE — Consult Note (Signed)
NAME:  [name], [name]                    ACCOUNT NO.:  [id number]   MEDICAL RECORD NO.:  [id number]                   PATIENT TYPE:  EMS   LOCATION:  ED                                   FACILITY:  [facility]   PHYSICIAN:  Currie [place], M.D.           DATE OF BIRTH:  [birth year]   DATE OF CONSULTATION:  09/17/2003  DATE OF DISCHARGE:                                   CONSULTATION   REASON FOR CONSULTATION:  Diverticulitis.   HISTORY OF PRESENT ILLNESS:  I was asked by Dr. Angelena Sole to see [name]  for surgical evaluation of [name] who has a clinical diagnosis of  acute diverticulitis.   Ms. Edell was in [name] usual state of good health until yesterday evening  when [name] developed a lower abdominal pain which was more on the right than  the left.  It has gotten gradually worse over 12 to 18 hours and [name]  presented to the emergency room about 10 a.m. this morning (it is about 5  p.m. currently).  The pain has remained in the lower abdomen and really more  on the right than the left.  [name] cannot find anything [name] does that makes it  comfortable.  Moving about makes it perhaps a little bit worse.  [name] had a  fairly normal bowel movement yesterday.  [name] has had some nausea but that  has really been [name] only other symptoms.  When [name] was originally seen in  the emergency room, [name] pain was about a 7 out of 10 and after pain  medication now down to about a 2 or 3.  [name] has also had some associated  burning with urination.   [name] says [name] has never had any pain like this before.  Never really had any  other significant GI complaints although has had some problems, I believe,  in the past with hemorrhoids.  [name] has not seen any blood in [name] stools, not  had any change in [name] bowel habits, not had any nausea or vomiting, weight  changes, etc., over the last several months and basically has been as far as  [name] is concerned at [name] normal state until yesterday when this  developed.   PAST MEDICAL HISTORY:   OPERATIONS:  [name] has had a hysterectomy and an exploratory laparotomy at  which time they did an appendectomy but [name] did not have appendicitis.   MEDICATIONS:  None.   ALLERGIES:  IBUPROFEN which irritates [name] stomach.   HABITS:  Does drink occasionally.  Does smoke about a pack per day.  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   FAMILY HISTORY:  I believe [name] father has had diverticulitis.  There is no  family significant history of cancer, heart disease, diabetes.   REVIEW OF SYSTEMS:  HEAD:  There is no particular problems with headaches.  EYES:  Does wear glasses.  No other eye problems.  EARS AND NECK:  No  history of thyroid disease.  CHEST:  No cough, shortness of breath.  HEART:  Had no history of murmurs, hypertension, cardiac pain, cardiac disease.  ABDOMEN:  Negative except for HPI.  GU:  Negative.  EXTREMITIES:  Basically  negative.  Occasional aches in [name] back.   PHYSICAL EXAMINATION:  GENERAL:  The patient is generally alert, oriented,  no in acute distress.  VITAL SIGNS:  Blood pressure was 113/68 with a pulse of 90 when [name] came in  and a temp of 98.1.  A followup about 4 hours later showed [name] temp up to  101.6.  HEENT:  Head is normocephalic.  Eyes:  PERRLA.  EOMs intact, nonicteric.  Pharynx:  Mucous membranes a little bit dry, otherwise unremarkable.  NECK:  Supple.  There are no masses and no thyromegaly noted.  LUNGS:  Clear to auscultation with normal respirations.  HEART:  Regular rhythm.  I hear no murmurs, rubs, or gallops.  [name] has  intact carotid, femoral, dorsalis pedis pulses.  ABDOMEN:  [name] has a well-healed lower midline scar.  Is somewhat distended.  It is basically soft throughout but [name] has some moderate tenderness in both  lower quadrants, the right worse than left.  I believe [name] has a little bit  of rebound but this is really a minimal finding.  Bowel sounds are present  but markedly diminished.  PELVIC AND  RECTAL:  Not done.  EXTREMITIES:  A few superficial varicosities.  No significant other  abnormalities noted.  Good range of motion.  No edema is noted.   DATA REVIEWED:  [name] white count is elevated at about 21,000.  [name] hemoglobin  is slightly low at 8.9.  [name] has a left shift with 98% neutrophils.  Electrolytes show a sodium 133, potassium 3.2 otherwise normal.  Urinalysis  shows a specific gravity of about 1.022 suggesting some mild volume  depletion, otherwise negative.  CMET has not been done.   CT scan was also reviewed with Audie Pinto, M.D., the radiologist who read  it.  [name] has a marked thickening of sigmoid colon which also extends over to  the right side of the abdomen.  This is a fairly significant thickening.  There is a little bit of fluid in the pelvis which may represent an abscess  or some free fluid but is not a large volume and does not look well  loculated so this may well be free fluid.  There is nothing to suggest free  air or any other abnormalities to suggest diffuse peritonitis, fluid  collection, cells around the abdomen, etc.  No other abnormalities were  noted.   IMPRESSION:  1. Sigmoid diverticulitis with possible abscess formation.  2. Hypokalemia and hyponatremia.  3. Anemia.  4. Volume depletion.   RECOMMENDATIONS:  They have already started [name] on antibiotics and this is  appropriate.  I would keep [name] NPO and probably some DVT prophylaxis.  Check  a chest x-ray and EKG in case [name] needs to go the operating room and  replenish potassium, sodium as appropriate.   It may be possible to get this collection tapped with CT guidance from a  posterior approach.  If this is an abscess, that would be helpful.  If this  is some free fluid, it will most likely spread out when [name] is placed in the  prone position for the tap and therefore not accessible.  Might also be  possible simply to place [name] on antibiotics for 24 to 48  hours and follow [name] clinically  and reassess [name] with a CT scan and possible drainage at that  point in time.   Should [name] deteriorate, [name] would need surgical intervention with sigmoid  colectomy and most likely colostomy and Hartmann.  At this point, I think  the clinical status is that of diverticulitis without free peritonitis and  the nonoperative management appropriate.   I have discussed this with the patient and [name] family who are with [name].  They understand the issues and we will continue to follow [name] up.                                               Currie [place], M.D.    CJS/MEDQ  D:  09/17/2003  T:  09/17/2003  Job:  [id number]

## 2011-02-08 NOTE — Op Note (Signed)
NAME:  [name], POIRIER                    ACCOUNT NO.:  [id number]   MEDICAL RECORD NO.:  [id number]                   PATIENT TYPE:  AMB   LOCATION:  ENDO                                 FACILITY:  [facility]   PHYSICIAN:  Georgiana Spinner, M.D.                 DATE OF BIRTH:  10-Oct-1940   DATE OF PROCEDURE:  DATE OF DISCHARGE:                                 OPERATIVE REPORT   PROCEDURE:  Upper endoscopy.   INDICATIONS:  Hemoccult positivity.   ANESTHESIA:  Demerol 50, Versed 5 mg.   PROCEDURE:  With the patient mildly sedated in the left lateral decubitus  position, an Olympus videoscopic endoscope was inserted __________ and  passed under direct vision through the esophagus.  In the distal esophagus,  there was a question of Barrett's.  We photographed and biopsied this area.  Entered the stomach.  The fundus, body, antrum, duodenal bulb, second  portion of the duodenum all appeared normal.  From this point, the endoscope  was slowly withdrawn after taking several views of the duodenal mucosa to  the endoscope and pulled back into the stomach, placed in retroflexion,  viewed the stomach from below.  An incomplete ramp of the GE junction was  seen.  Photographed.  The endoscope was straightened and withdrawn.  Patient's vital signs and pulse oximetry remained stable.  Patient tolerated  the procedure well with no apparent complications.   FINDINGS:  A question of Barrett's esophagus, biopsied, otherwise  unremarkable examination other than hiatal hernia.   PLAN:  Proceed to colonoscopy.  Patient will call me for results of biopsy  and follow up with me as an outpatient.                                               Georgiana Spinner, M.D.    GMO/MEDQ  D:  11/23/2003  T:  11/23/2003  Job:  [id number]

## 2011-02-08 NOTE — Consult Note (Signed)
NAME:  [name], [name] NO.:  [id number]   MEDICAL RECORD NO.:  [id number]                   PATIENT TYPE:  INP   LOCATION:  5621                                 FACILITY:  [facility]   PHYSICIAN:  Georgiana Spinner, M.D.                 DATE OF BIRTH:  Nov 28, 1940   DATE OF CONSULTATION:  DATE OF DISCHARGE:                                   CONSULTATION   HISTORY OF PRESENT ILLNESS:  [name] is a 70 year old lady who I have  been asked to see for evaluation.  She presented to the hospital on  Christmas Day 2004 after experiencing severe right lower quadrant abdominal  pain the evening prior to admission.  She endured it overnight thinking it  might have been a kidney stone, it might go away, but when it persisted and  she felt it radiating to her left lower quadrant she came to the hospital.  She states that she had a temperature and on admission had a white count of  20,800.  CT scan revealed diverticulosis with possible abscess.  She was  seen in consultation by Dr. Jamey Ripa and subsequently also by Dr. Jama Flavors because of ongoing anemia.   PAST MEDICAL HISTORY:  She is status post hysterectomy in 1986.  She has had  an appendectomy in 1957, tonsillectomy, and I performed an endoscopy and  colonoscopy apparently about six years ago.  She has past medical history  otherwise to be unremarkable.   MEDICATIONS:  She takes no medications on a regular basis.   ALLERGIES:  She has an intolerance to IBUPROFEN.   SOCIAL HISTORY:  She smokes a pack of cigarettes a day, drinks alcohol  occasionally.   FAMILY HISTORY:  No family history of colon polyps or colon cancer.   HOSPITAL COURSE:  The patient subsequently has undergone treatment with  antibiotics and drainage of an abscess percutaneously by radiology and now  that I am seeing her she is feeling considerably better, tolerating a diet,  passing somewhat semi-formed stool today.  Still not having much  in the way  of flatus.   PHYSICAL EXAMINATION:  GENERAL:  Currently, she is alert, oriented.  Appears  in no distress.  Somewhat thin female.  VITAL SIGNS:  Temperature 99.2, pulse 73, respiratory rate 20 with an O2  saturation of 94% on room air, blood pressure 114/61 which has been  relatively stable.  HEENT:  Mildly pale sclerae.  There is no jaundice.  NECK:  No lymphadenopathy is noted.  No bruits are appreciated in the neck.  LUNGS:  Clear.  HEART:  Regular rhythm without murmurs, rubs, or gallops.  ABDOMEN:  Still somewhat distended, but soft, nontender to examination.  RECTAL:  Deferred.   IMPRESSION:  Diverticular abscess status post percutaneous drainage with  antibiotics and anemia which is iron deficient, etiology which is not clear.  Because of the abdominal distention, I would cut  back on her diet from a  regular diet to one that is low residue in the intermediate term, and  subsequently, she will certainly need a colonoscopy.  She has apparently had  polyps found in the past but given the recent history a repeat colonoscopy  is certainly warranted, but at this time, I think it should be at least six  weeks from now to allow adequate time for healing of her diverticulitis and  allowing time for [place] to perform an adequate examination with adequate  preparation, and I discussed that with her and we will follow her for the  time being to make sure that her immediate needs are being met and will plan  to have her see me as an outpatient in about four to six weeks so we can  schedule her colonoscopy at that time.                                               Georgiana Spinner, M.D.    GMO/MEDQ  D:  09/26/2003  T:  09/26/2003  Job:  [id number]   cc:   Currie [place], M.D.  [address]  [place]  [place] [address]  Fax: [phone]   Lennis P. Darrold Span, M.D.  [address] [facility]  [place]  [place] [address]  Fax: [phone]

## 2011-05-10 ENCOUNTER — Encounter (HOSPITAL_BASED_OUTPATIENT_CLINIC_OR_DEPARTMENT_OTHER): Payer: Medicare Other | Admitting: Oncology

## 2011-05-10 ENCOUNTER — Other Ambulatory Visit: Payer: Self-pay | Admitting: Oncology

## 2011-05-10 DIAGNOSIS — D591 Autoimmune hemolytic anemia, unspecified: Secondary | ICD-10-CM

## 2011-05-10 DIAGNOSIS — K8020 Calculus of gallbladder without cholecystitis without obstruction: Secondary | ICD-10-CM

## 2011-05-10 LAB — TECHNOLOGIST REVIEW

## 2011-05-10 LAB — CBC WITH DIFFERENTIAL/PLATELET
Basophils Absolute: 0.1 10*3/uL (ref 0.0–0.1)
Eosinophils Absolute: 0.1 10*3/uL (ref 0.0–0.5)
HGB: 10 g/dL — ABNORMAL LOW (ref 11.6–15.9)
MCV: 107.3 fL — ABNORMAL HIGH (ref 79.5–101.0)
MONO#: 0.8 10*3/uL (ref 0.1–0.9)
MONO%: 8.7 % (ref 0.0–14.0)
NEUT#: 4.2 10*3/uL (ref 1.5–6.5)
RDW: 13.8 % (ref 11.2–14.5)

## 2011-05-20 ENCOUNTER — Other Ambulatory Visit: Payer: Self-pay | Admitting: Dermatology

## 2011-06-26 ENCOUNTER — Other Ambulatory Visit: Payer: Self-pay | Admitting: Oncology

## 2011-06-26 DIAGNOSIS — Z1231 Encounter for screening mammogram for malignant neoplasm of breast: Secondary | ICD-10-CM

## 2011-07-25 ENCOUNTER — Ambulatory Visit
Admission: RE | Admit: 2011-07-25 | Discharge: 2011-07-25 | Disposition: A | Discharge status: Home / Self Care | Payer: Medicare Other | Source: Ambulatory Visit | Attending: Oncology | Admitting: Oncology

## 2011-07-25 DIAGNOSIS — Z1231 Encounter for screening mammogram for malignant neoplasm of breast: Secondary | ICD-10-CM

## 2011-09-30 ENCOUNTER — Other Ambulatory Visit: Payer: Self-pay | Admitting: Oncology

## 2011-09-30 DIAGNOSIS — D649 Anemia, unspecified: Secondary | ICD-10-CM

## 2012-04-17 ENCOUNTER — Telehealth: Payer: Self-pay | Admitting: Oncology

## 2012-04-17 NOTE — Telephone Encounter (Signed)
lmonvm for pt re appt for 8/27 and mailed schedule.

## 2012-05-12 ENCOUNTER — Other Ambulatory Visit: Payer: Self-pay | Admitting: Oncology

## 2012-05-12 ENCOUNTER — Telehealth: Payer: Self-pay | Admitting: Oncology

## 2012-05-12 NOTE — Telephone Encounter (Signed)
Talked to pt, appt was r/s from 8/26 per MD to 06/10/12 lab and MD, pt aware of appt

## 2012-05-19 ENCOUNTER — Ambulatory Visit: Payer: Medicare Other | Admitting: Oncology

## 2012-05-19 ENCOUNTER — Other Ambulatory Visit: Payer: Medicare Other | Admitting: Lab

## 2012-06-10 ENCOUNTER — Ambulatory Visit (HOSPITAL_BASED_OUTPATIENT_CLINIC_OR_DEPARTMENT_OTHER): Payer: Medicare Other | Admitting: Oncology

## 2012-06-10 ENCOUNTER — Telehealth: Payer: Self-pay | Admitting: Oncology

## 2012-06-10 ENCOUNTER — Encounter: Payer: Self-pay | Admitting: Oncology

## 2012-06-10 ENCOUNTER — Ambulatory Visit (HOSPITAL_BASED_OUTPATIENT_CLINIC_OR_DEPARTMENT_OTHER): Payer: Medicare Other | Admitting: Lab

## 2012-06-10 VITALS — BP 122/59 | HR 69 | Temp 98.3°F | Resp 18 | Ht 60.0 in | Wt 86.7 lb

## 2012-06-10 DIAGNOSIS — Z720 Tobacco use: Secondary | ICD-10-CM | POA: Insufficient documentation

## 2012-06-10 DIAGNOSIS — D591 Autoimmune hemolytic anemia, unspecified: Secondary | ICD-10-CM

## 2012-06-10 DIAGNOSIS — Z1239 Encounter for other screening for malignant neoplasm of breast: Secondary | ICD-10-CM

## 2012-06-10 DIAGNOSIS — K8020 Calculus of gallbladder without cholecystitis without obstruction: Secondary | ICD-10-CM

## 2012-06-10 DIAGNOSIS — F17200 Nicotine dependence, unspecified, uncomplicated: Secondary | ICD-10-CM

## 2012-06-10 DIAGNOSIS — D5912 Cold autoimmune hemolytic anemia: Secondary | ICD-10-CM

## 2012-06-10 LAB — COMPREHENSIVE METABOLIC PANEL (CC13)
ALT: 14 U/L (ref 0–55)
AST: 21 U/L (ref 5–34)
CO2: 33 mEq/L — ABNORMAL HIGH (ref 22–29)
Calcium: 9.1 mg/dL (ref 8.4–10.4)
Chloride: 100 mEq/L (ref 98–107)
Creatinine: 0.6 mg/dL (ref 0.6–1.1)
Sodium: 144 mEq/L (ref 136–145)
Total Protein: 6.3 g/dL — ABNORMAL LOW (ref 6.4–8.3)

## 2012-06-10 LAB — CBC WITH DIFFERENTIAL/PLATELET
BASO%: 0.4 % (ref 0.0–2.0)
Eosinophils Absolute: 0.1 10*3/uL (ref 0.0–0.5)
MCHC: 35.5 g/dL (ref 31.5–36.0)
MONO#: 1.1 10*3/uL — ABNORMAL HIGH (ref 0.1–0.9)
NEUT#: 6.3 10*3/uL (ref 1.5–6.5)
RBC: 2.17 10*6/uL — ABNORMAL LOW (ref 3.70–5.45)
RDW: 16.3 % — ABNORMAL HIGH (ref 11.2–14.5)
WBC: 12 10*3/uL — ABNORMAL HIGH (ref 3.9–10.3)
lymph#: 4.5 10*3/uL — ABNORMAL HIGH (ref 0.9–3.3)
nRBC: 0 % (ref 0–0)

## 2012-06-10 LAB — FOLATE: Folate: >20 ng/mL

## 2012-06-10 LAB — RETICULOCYTES: RBC: 2.17 10*6/uL — ABNORMAL LOW (ref 3.70–5.45)

## 2012-06-10 LAB — LACTATE DEHYDROGENASE (CC13): LDH: 312 U/L — ABNORMAL HIGH (ref 125–220)

## 2012-06-10 NOTE — Progress Notes (Signed)
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  Patient has been feeling generally very well, with no increased symptoms from anemia including no increased shortness of breath, fatigue or chest pain. She has had no recent infectious illness. She continues to smoke 1 ppd, however tells me now "I have decided to stop". [employer] manager for smoking cessation has met with her now and has given information including upcoming tobacco cessation classes. Appetite is at baseline. She denies any bleeding or any change in bowels or bladder. Energy is at baseline  Remainder of 10 point Review of Systems negative.  Objective:  Vital signs in last 24 hours:  BP 122/59  Pulse 69  Temp 98.3 F (36.8 C) (Oral)  Resp 18  Ht 5' (1.524 m)  Wt 86 lb 11.2 oz (39.327 kg)  BMI 16.93 kg/m2 Weight is stable from a year ago. She is easily ambulatory, appears comfortable, respirations not labored RA.  HEENT:PERRLA, sclera clear, anicteric, oropharynx clear, no lesions and neck supple with midline trachea LymphaticsCervical, supraclavicular, and axillary nodes normal. Resp: hyperresonant to percussion and diminished BS thruout, otherwise clear Cardio: regular rate and rhythm no gallop GI: soft, non-tender; bowel sounds normal; no masses,  no organomegaly Extremities: extremities normal, atraumatic, no cyanosis or edema Neuro: grossly nonfocal Breasts bilaterally without dominant mass, skin or nipple findings. Axillae not remarkable. Skin without rash or ecchymosis  Lab Results:  Results for orders placed in visit on 06/10/12  CBC WITH DIFFERENTIAL      Component Value Range   WBC 12.0 (*) 3.9 - 10.3 10e3/uL   NEUT# 6.3  1.5 - 6.5 10e3/uL   HGB 8.9 (*) 11.6 - 15.9 g/dL   HCT 16.1 (*) 09.6 - 04.5 %   Platelets  421 (*) 145 - 400 10e3/uL   MCV 115.7 (*) 79.5 - 101.0 fL   MCH 41.0 (*) 25.1 - 34.0 pg   MCHC 35.5  31.5 - 36.0 g/dL   RBC 4.09 (*) 8.11 - 9.14 10e6/uL   RDW 16.3 (*) 11.2 - 14.5 %   lymph# 4.5 (*) 0.9 - 3.3 10e3/uL   MONO# 1.1 (*) 0.1 - 0.9 10e3/uL   Eosinophils Absolute 0.1  0.0 - 0.5 10e3/uL   Basophils Absolute 0.1  0.0 - 0.1 10e3/uL   NEUT% 52.8  38.4 - 76.8 %   LYMPH% 37.3  14.0 - 49.7 %   MONO% 8.8  0.0 - 14.0 %   EOS% 0.7  0.0 - 7.0 %   BASO% 0.4  0.0 - 2.0 %   nRBC 0  0 - 0 %   Hgb has been ~ 10 in Feb and Aug 2012. NOTE some question of technique for this blood draw (tube not warmed prior)  Other labs available after visit: CMET with total bili 3.8, T prot 6.3, CO2 33 otherwise normal.  LDH 312. Retics 6.2%, folate >20, B12 slightly low at 196, Haptoglobin < 25,  Studies[facility]Results[facility].  Medications: I have reviewed the patient's current medications.  Assessment/Plan: 1.Cold agglutinin hemolytic anemia: likely more hemolysis presently, tho not clear that the Hgb is entirely correct and patient is feeling well. Continue folate and I will see her back in 2-3 months unless more symptoms. 2.slightly low B12: likely will be worth supplementing and we will be in  touch with patient in this regard. 3.long and ongoing tobacco: discussed smoking cessation extensively today 4.past diverticulitis: no symptoms now    Haleem Hanner P, MD   06/10/2012, 2:44 PM

## 2012-06-10 NOTE — Patient Instructions (Signed)
Smoking cessation as discussed

## 2012-06-10 NOTE — Telephone Encounter (Signed)
Pt sent back to lb and given appt schedule for November. Per 9/18 pof lb/fu 2-3 months.

## 2012-06-16 ENCOUNTER — Other Ambulatory Visit: Payer: Self-pay | Admitting: Oncology

## 2012-06-16 ENCOUNTER — Telehealth: Payer: Self-pay

## 2012-06-16 ENCOUNTER — Telehealth: Payer: Self-pay | Admitting: Oncology

## 2012-06-16 DIAGNOSIS — D5912 Cold autoimmune hemolytic anemia: Secondary | ICD-10-CM

## 2012-06-16 DIAGNOSIS — E538 Deficiency of other specified B group vitamins: Secondary | ICD-10-CM

## 2012-06-16 NOTE — Telephone Encounter (Signed)
Called pt and left message regarding labs, injection and Md visit for September , October and November 2013

## 2012-06-16 NOTE — Telephone Encounter (Signed)
Told [name] that that Dr. Darrold Span  said that she is hemolyzing  some more with the cold agglutinin, but she also is a little low on B12.  Dr. Darrold Span would like to begin B12 injections weekly x 4 then once a mont here at [facility].  Patient is in an agreement with this.  Told [name] that Dr. Darrold Span wants her to increaseher folic acid to twice a day.  Pt. Verbalized understanding. Scheduled for first injection  For 06-19-12.  POF will need to be sent to schedulers for additional appts for injections and follow up prior to presently scheduled 08-14-12 appointment.

## 2012-06-19 ENCOUNTER — Ambulatory Visit (HOSPITAL_BASED_OUTPATIENT_CLINIC_OR_DEPARTMENT_OTHER): Payer: Medicare Other

## 2012-06-19 VITALS — BP 139/65 | HR 65 | Temp 98.4°F

## 2012-06-19 DIAGNOSIS — E538 Deficiency of other specified B group vitamins: Secondary | ICD-10-CM

## 2012-06-19 MED ORDER — CYANOCOBALAMIN 1000 MCG/ML IJ SOLN
1000.0000 ug | Freq: Once | INTRAMUSCULAR | Status: AC
  Administered 2012-06-19: 1000 ug via INTRAMUSCULAR

## 2012-06-25 ENCOUNTER — Other Ambulatory Visit: Payer: Self-pay | Admitting: Oncology

## 2012-06-25 DIAGNOSIS — Z1231 Encounter for screening mammogram for malignant neoplasm of breast: Secondary | ICD-10-CM

## 2012-06-26 ENCOUNTER — Other Ambulatory Visit (HOSPITAL_BASED_OUTPATIENT_CLINIC_OR_DEPARTMENT_OTHER): Payer: Medicare Other | Admitting: Lab

## 2012-06-26 ENCOUNTER — Ambulatory Visit (HOSPITAL_BASED_OUTPATIENT_CLINIC_OR_DEPARTMENT_OTHER): Payer: Medicare Other

## 2012-06-26 VITALS — BP 144/67 | HR 70 | Temp 98.9°F

## 2012-06-26 DIAGNOSIS — E538 Deficiency of other specified B group vitamins: Secondary | ICD-10-CM

## 2012-06-26 DIAGNOSIS — D591 Autoimmune hemolytic anemia, unspecified: Secondary | ICD-10-CM

## 2012-06-26 DIAGNOSIS — D5912 Cold autoimmune hemolytic anemia: Secondary | ICD-10-CM

## 2012-06-26 LAB — CBC WITH DIFFERENTIAL/PLATELET
BASO%: 0.4 % (ref 0.0–2.0)
Basophils Absolute: 0.1 10*3/uL (ref 0.0–0.1)
HCT: 27.9 % — ABNORMAL LOW (ref 34.8–46.6)
HGB: 9.8 g/dL — ABNORMAL LOW (ref 11.6–15.9)
MONO#: 1 10*3/uL — ABNORMAL HIGH (ref 0.1–0.9)
NEUT%: 50.2 % (ref 38.4–76.8)
RDW: 14.3 % (ref 11.2–14.5)
WBC: 12.4 10*3/uL — ABNORMAL HIGH (ref 3.9–10.3)
lymph#: 5 10*3/uL — ABNORMAL HIGH (ref 0.9–3.3)

## 2012-06-26 LAB — TECHNOLOGIST REVIEW

## 2012-06-26 MED ORDER — CYANOCOBALAMIN 1000 MCG/ML IJ SOLN
1000.0000 ug | Freq: Once | INTRAMUSCULAR | Status: AC
  Administered 2012-06-26: 1000 ug via INTRAMUSCULAR

## 2012-07-03 ENCOUNTER — Ambulatory Visit (HOSPITAL_BASED_OUTPATIENT_CLINIC_OR_DEPARTMENT_OTHER): Payer: Medicare Other

## 2012-07-03 VITALS — BP 133/55 | HR 64 | Temp 97.7°F

## 2012-07-03 DIAGNOSIS — E538 Deficiency of other specified B group vitamins: Secondary | ICD-10-CM

## 2012-07-03 MED ORDER — CYANOCOBALAMIN 1000 MCG/ML IJ SOLN
1000.0000 ug | Freq: Once | INTRAMUSCULAR | Status: AC
  Administered 2012-07-03: 1000 ug via INTRAMUSCULAR

## 2012-07-10 ENCOUNTER — Other Ambulatory Visit (HOSPITAL_BASED_OUTPATIENT_CLINIC_OR_DEPARTMENT_OTHER): Payer: Medicare Other | Admitting: Lab

## 2012-07-10 ENCOUNTER — Ambulatory Visit (HOSPITAL_BASED_OUTPATIENT_CLINIC_OR_DEPARTMENT_OTHER): Payer: Medicare Other

## 2012-07-10 VITALS — BP 108/56 | HR 68 | Temp 98.7°F

## 2012-07-10 DIAGNOSIS — D5912 Cold autoimmune hemolytic anemia: Secondary | ICD-10-CM

## 2012-07-10 DIAGNOSIS — E538 Deficiency of other specified B group vitamins: Secondary | ICD-10-CM

## 2012-07-10 DIAGNOSIS — D591 Autoimmune hemolytic anemia, unspecified: Secondary | ICD-10-CM

## 2012-07-10 LAB — CBC WITH DIFFERENTIAL/PLATELET
Eosinophils Absolute: 0.1 10*3/uL (ref 0.0–0.5)
HCT: 28.8 % — ABNORMAL LOW (ref 34.8–46.6)
LYMPH%: 39.5 % (ref 14.0–49.7)
MCHC: 35.8 g/dL (ref 31.5–36.0)
MCV: 114.3 fL — ABNORMAL HIGH (ref 79.5–101.0)
MONO#: 1.3 10*3/uL — ABNORMAL HIGH (ref 0.1–0.9)
MONO%: 8.7 % (ref 0.0–14.0)
NEUT#: 7.5 10*3/uL — ABNORMAL HIGH (ref 1.5–6.5)
NEUT%: 50.7 % (ref 38.4–76.8)
Platelets: 431 10*3/uL — ABNORMAL HIGH (ref 145–400)
WBC: 14.7 10*3/uL — ABNORMAL HIGH (ref 3.9–10.3)

## 2012-07-10 MED ORDER — CYANOCOBALAMIN 1000 MCG/ML IJ SOLN
1000.0000 ug | Freq: Once | INTRAMUSCULAR | Status: AC
  Administered 2012-07-10: 1000 ug via INTRAMUSCULAR

## 2012-07-27 ENCOUNTER — Ambulatory Visit
Admission: RE | Admit: 2012-07-27 | Discharge: 2012-07-27 | Disposition: A | Discharge status: Home / Self Care | Payer: Medicare Other | Source: Ambulatory Visit | Attending: Oncology | Admitting: Oncology

## 2012-07-27 DIAGNOSIS — Z1231 Encounter for screening mammogram for malignant neoplasm of breast: Secondary | ICD-10-CM

## 2012-08-01 ENCOUNTER — Other Ambulatory Visit: Payer: Self-pay | Admitting: Oncology

## 2012-08-07 ENCOUNTER — Ambulatory Visit (HOSPITAL_BASED_OUTPATIENT_CLINIC_OR_DEPARTMENT_OTHER): Payer: Medicare Other

## 2012-08-07 ENCOUNTER — Other Ambulatory Visit: Payer: Medicare Other

## 2012-08-07 ENCOUNTER — Ambulatory Visit (HOSPITAL_BASED_OUTPATIENT_CLINIC_OR_DEPARTMENT_OTHER): Payer: Medicare Other | Admitting: Oncology

## 2012-08-07 ENCOUNTER — Ambulatory Visit: Payer: Medicare Other

## 2012-08-07 ENCOUNTER — Other Ambulatory Visit (HOSPITAL_BASED_OUTPATIENT_CLINIC_OR_DEPARTMENT_OTHER): Payer: Medicare Other | Admitting: Lab

## 2012-08-07 ENCOUNTER — Encounter: Payer: Self-pay | Admitting: Oncology

## 2012-08-07 VITALS — BP 139/68 | HR 69 | Temp 98.0°F | Resp 20 | Ht 60.0 in | Wt 83.9 lb

## 2012-08-07 DIAGNOSIS — E538 Deficiency of other specified B group vitamins: Secondary | ICD-10-CM

## 2012-08-07 DIAGNOSIS — D591 Autoimmune hemolytic anemia, unspecified: Secondary | ICD-10-CM

## 2012-08-07 DIAGNOSIS — D649 Anemia, unspecified: Secondary | ICD-10-CM

## 2012-08-07 DIAGNOSIS — D5912 Cold autoimmune hemolytic anemia: Secondary | ICD-10-CM

## 2012-08-07 DIAGNOSIS — K8020 Calculus of gallbladder without cholecystitis without obstruction: Secondary | ICD-10-CM

## 2012-08-07 DIAGNOSIS — F17200 Nicotine dependence, unspecified, uncomplicated: Secondary | ICD-10-CM

## 2012-08-07 LAB — LACTATE DEHYDROGENASE (CC13): LDH: 289 U/L — ABNORMAL HIGH (ref 125–245)

## 2012-08-07 LAB — CBC WITH DIFFERENTIAL/PLATELET
Basophils Absolute: 0.1 10*3/uL (ref 0.0–0.1)
Eosinophils Absolute: 0 10*3/uL (ref 0.0–0.5)
HCT: 29.3 % — ABNORMAL LOW (ref 34.8–46.6)
HGB: 10.1 g/dL — ABNORMAL LOW (ref 11.6–15.9)
LYMPH%: 36.1 % (ref 14.0–49.7)
MCV: 106.9 fL — ABNORMAL HIGH (ref 79.5–101.0)
MONO%: 8.9 % (ref 0.0–14.0)
NEUT#: 6.1 10*3/uL (ref 1.5–6.5)
NEUT%: 54 % (ref 38.4–76.8)
Platelets: 441 10*3/uL — ABNORMAL HIGH (ref 145–400)

## 2012-08-07 LAB — COMPREHENSIVE METABOLIC PANEL (CC13)
BUN: 11 mg/dL (ref 7.0–26.0)
CO2: 30 mEq/L — ABNORMAL HIGH (ref 22–29)
Creatinine: 0.7 mg/dL (ref 0.6–1.1)
Glucose: 98 mg/dl (ref 70–99)
Sodium: 139 mEq/L (ref 136–145)
Total Bilirubin: 3.37 mg/dL — ABNORMAL HIGH (ref 0.20–1.20)
Total Protein: 6.7 g/dL (ref 6.4–8.3)

## 2012-08-07 LAB — HAPTOGLOBIN: Haptoglobin: <25 mg/dL — ABNORMAL LOW (ref 45–215)

## 2012-08-07 MED ORDER — CYANOCOBALAMIN 1000 MCG/ML IJ SOLN
1000.0000 ug | Freq: Once | INTRAMUSCULAR | Status: AC
  Administered 2012-08-07: 1000 ug via INTRAMUSCULAR

## 2012-08-07 NOTE — Progress Notes (Signed)
OFFICE PROGRESS NOTE   08/07/2012   Physicians: W.Pharr, M.Magod  INTERVAL HISTORY:  Patient is seen, alone for visit, in follow up of cold agglutinin hemolytic anemia and having begun Vit B12 injections on 07-10-12 for slightly low B12. She is on supplemental folate.   Patient has had no new or different problems since she was here last. She continues to smoke despite recommendations. Remainder of 10 point Review of Systems negative/ unchanged.. Patient's mother is B12 deficient. Objective:  Vital signs in last 24 hours:  BP 139/68  Pulse 69  Temp 98 F (36.7 C) (Oral)  Resp 20  Ht 5' (1.524 m)  Wt 83 lb 14.4 oz (38.057 kg)  BMI 16.39 kg/m2 Weight is down ~ 2 lbs. Easily mobile, wearing gloves in exam room.   HEENT:PERRL, not icteric, oral mucosa clear and moist LymphaticsCervical, supraclavicular, and axillary nodes normal. Resp: hyperresonant to percussion, diminished breath sounds otherwise clear Cardio: regular rate and rhythm GI: soft, non-tender; bowel sounds normal; no masses,  no organomegaly Extremities: extremities normal, atraumatic, no cyanosis or edema Skin without rash or ecchymosis  Lab Results:  Results for orders placed in visit on 08/07/12  CBC WITH DIFFERENTIAL      Component Value Range   WBC 11.3 (*) 3.9 - 10.3 10e3/uL   NEUT# 6.1  1.5 - 6.5 10e3/uL   HGB 10.1 (*) 11.6 - 15.9 g/dL   HCT 16.1 (*) 09.6 - 04.5 %   Platelets 441 (*) 145 - 400 10e3/uL   MCV 106.9 (*) 79.5 - 101.0 fL   MCH 36.9 (*) 25.1 - 34.0 pg   MCHC 34.5  31.5 - 36.0 g/dL   RBC 4.09 (*) 8.11 - 9.14 10e6/uL   RDW 13.5  11.2 - 14.5 %   lymph# 4.1 (*) 0.9 - 3.3 10e3/uL   MONO# 1.0 (*) 0.1 - 0.9 10e3/uL   Eosinophils Absolute 0.0  0.0 - 0.5 10e3/uL   Basophils Absolute 0.1  0.0 - 0.1 10e3/uL   NEUT% 54.0  38.4 - 76.8 %   LYMPH% 36.1  14.0 - 49.7 %   MONO% 8.9  0.0 - 14.0 %   EOS% 0.4  0.0 - 7.0 %   BASO% 0.6  0.0 - 2.0 %   nRBC 0  0 - 0 %  COMPREHENSIVE METABOLIC PANEL  (NW29)      Component Value Range   Sodium 139  136 - 145 mEq/L   Potassium 3.9  3.5 - 5.1 mEq/L   Chloride 101  98 - 107 mEq/L   CO2 30 (*) 22 - 29 mEq/L   Glucose 98  70 - 99 mg/dl   BUN 56.2  7.0 - 13.0 mg/dL   Creatinine 0.7  0.6 - 1.1 mg/dL   Total Bilirubin 8.65 (*) 0.20 - 1.20 mg/dL   Alkaline Phosphatase 133  40 - 150 U/L   AST 24  5 - 34 U/L   ALT 18  0 - 55 U/L   Total Protein 6.7  6.4 - 8.3 g/dL   Albumin 4.3  3.5 - 5.0 g/dL   Calcium 9.5  8.4 - 78.4 mg/dL  LACTATE DEHYDROGENASE (CC13)      Component Value Range   LDH 289 (*) 125 - 245 U/L  HAPTOGLOBIN      Component Value Range   Haptoglobin <25 (*) 45 - 215 mg/dL  TECHNOLOGIST REVIEW      Component Value Range   Technologist Review Variant lymphs present  Studies/Results:  No results found.  Medications: I have reviewed the patient's current medications. She plans to have flu vaccine at local pharmacy upcoming.  Assessment/Plan: 1.Cold agglutinin hemolytic anemia: seems stable, hemoglobin value some better today either with lab technique or other changes.  2.slightly low B12 with macrocytic indices: now on B12 injections monthly. 3.ongoing tobacco 4.hx diverticulitis, not symptomatic 5.flu vaccine as above  Patient was in agreement with plan above. I will see her back in ~ 6 months.  [name] P, MD   08/07/2012, 7:32 PM

## 2012-08-07 NOTE — Patient Instructions (Signed)
Continue folic acid twice daily and W09

## 2012-08-14 ENCOUNTER — Other Ambulatory Visit: Payer: Medicare Other | Admitting: Lab

## 2012-08-14 ENCOUNTER — Ambulatory Visit: Payer: Medicare Other | Admitting: Oncology

## 2012-08-25 ENCOUNTER — Telehealth: Payer: Self-pay | Admitting: Oncology

## 2012-08-25 NOTE — Telephone Encounter (Signed)
Changed time of 57 appt due to LL will have a meeting after 11:30 am pt. lmonvm for pt re change w/new time for 5/7 @ 11 am. Also confirmed 12/13 appt and pt to get new schedule when she comes in.

## 2012-09-04 ENCOUNTER — Other Ambulatory Visit: Payer: Medicare Other | Admitting: Lab

## 2012-09-04 ENCOUNTER — Ambulatory Visit (HOSPITAL_BASED_OUTPATIENT_CLINIC_OR_DEPARTMENT_OTHER): Payer: Medicare Other

## 2012-09-04 VITALS — BP 126/59 | HR 71 | Temp 98.5°F

## 2012-09-04 DIAGNOSIS — E538 Deficiency of other specified B group vitamins: Secondary | ICD-10-CM

## 2012-09-04 MED ORDER — CYANOCOBALAMIN 1000 MCG/ML IJ SOLN
1000.0000 ug | Freq: Once | INTRAMUSCULAR | Status: AC
  Administered 2012-09-04: 1000 ug via INTRAMUSCULAR

## 2012-09-17 ENCOUNTER — Other Ambulatory Visit: Payer: Self-pay | Admitting: Oncology

## 2012-09-17 DIAGNOSIS — D591 Autoimmune hemolytic anemia, unspecified: Secondary | ICD-10-CM

## 2012-09-17 DIAGNOSIS — D649 Anemia, unspecified: Secondary | ICD-10-CM

## 2012-09-17 DIAGNOSIS — E538 Deficiency of other specified B group vitamins: Secondary | ICD-10-CM

## 2012-10-02 ENCOUNTER — Other Ambulatory Visit (HOSPITAL_BASED_OUTPATIENT_CLINIC_OR_DEPARTMENT_OTHER): Payer: Medicare Other

## 2012-10-02 ENCOUNTER — Ambulatory Visit (HOSPITAL_BASED_OUTPATIENT_CLINIC_OR_DEPARTMENT_OTHER): Payer: Medicare Other

## 2012-10-02 VITALS — BP 118/57 | HR 72 | Temp 99.3°F

## 2012-10-02 DIAGNOSIS — D5912 Cold autoimmune hemolytic anemia: Secondary | ICD-10-CM

## 2012-10-02 DIAGNOSIS — E538 Deficiency of other specified B group vitamins: Secondary | ICD-10-CM

## 2012-10-02 DIAGNOSIS — D591 Autoimmune hemolytic anemia, unspecified: Secondary | ICD-10-CM

## 2012-10-02 LAB — CBC WITH DIFFERENTIAL/PLATELET
Basophils Absolute: 0.1 10*3/uL (ref 0.0–0.1)
EOS%: 0.4 % (ref 0.0–7.0)
HCT: 27.5 % — ABNORMAL LOW (ref 34.8–46.6)
HGB: 10 g/dL — ABNORMAL LOW (ref 11.6–15.9)
MCH: 41.5 pg — ABNORMAL HIGH (ref 25.1–34.0)
MCV: 114.1 fL — ABNORMAL HIGH (ref 79.5–101.0)
NEUT%: 47.1 % (ref 38.4–76.8)
Platelets: 393 10*3/uL (ref 145–400)
lymph#: 5.4 10*3/uL — ABNORMAL HIGH (ref 0.9–3.3)

## 2012-10-02 LAB — TECHNOLOGIST REVIEW

## 2012-10-02 MED ORDER — CYANOCOBALAMIN 1000 MCG/ML IJ SOLN
1000.0000 ug | Freq: Once | INTRAMUSCULAR | Status: AC
  Administered 2012-10-02: 1000 ug via INTRAMUSCULAR

## 2012-10-05 ENCOUNTER — Telehealth: Payer: Self-pay

## 2012-10-05 NOTE — Telephone Encounter (Signed)
Told Ms. Mallick her lab results from 10-02-12 as noted below from Dr. Darrold Span.

## 2012-10-05 NOTE — Telephone Encounter (Signed)
Message copied by Lorine Bears on Mon Oct 05, 2012  3:45 PM ------      Message from: [name]      Created: Sun Oct 04, 2012  9:36 AM       Labs seen and need follow up: please let her know hgb 10. Keep apts including B12 shots as scheduled

## 2012-11-06 ENCOUNTER — Ambulatory Visit: Payer: Medicare Other

## 2012-11-06 ENCOUNTER — Telehealth: Payer: Self-pay | Admitting: *Deleted

## 2012-11-06 ENCOUNTER — Other Ambulatory Visit: Payer: Medicare Other | Admitting: Lab

## 2012-11-06 NOTE — Telephone Encounter (Signed)
Patient called and left message to cancel her injection for today. I have called her back and rescheduled hr.  JMW

## 2012-11-10 ENCOUNTER — Ambulatory Visit (HOSPITAL_BASED_OUTPATIENT_CLINIC_OR_DEPARTMENT_OTHER): Payer: Medicare Other

## 2012-11-10 VITALS — BP 115/51 | HR 80 | Temp 98.5°F

## 2012-11-10 DIAGNOSIS — E538 Deficiency of other specified B group vitamins: Secondary | ICD-10-CM

## 2012-11-10 MED ORDER — CYANOCOBALAMIN 1000 MCG/ML IJ SOLN
1000.0000 ug | Freq: Once | INTRAMUSCULAR | Status: AC
  Administered 2012-11-10: 1000 ug via INTRAMUSCULAR

## 2012-12-04 ENCOUNTER — Ambulatory Visit (HOSPITAL_BASED_OUTPATIENT_CLINIC_OR_DEPARTMENT_OTHER): Payer: Medicare Other

## 2012-12-04 ENCOUNTER — Other Ambulatory Visit (HOSPITAL_BASED_OUTPATIENT_CLINIC_OR_DEPARTMENT_OTHER): Payer: Medicare Other | Admitting: Lab

## 2012-12-04 VITALS — BP 120/58 | HR 72 | Temp 98.7°F

## 2012-12-04 DIAGNOSIS — D5912 Cold autoimmune hemolytic anemia: Secondary | ICD-10-CM

## 2012-12-04 DIAGNOSIS — E538 Deficiency of other specified B group vitamins: Secondary | ICD-10-CM

## 2012-12-04 DIAGNOSIS — D591 Autoimmune hemolytic anemia, unspecified: Secondary | ICD-10-CM

## 2012-12-04 LAB — CBC WITH DIFFERENTIAL/PLATELET
BASO%: 0.5 % (ref 0.0–2.0)
EOS%: 0.3 % (ref 0.0–7.0)
HGB: 9.6 g/dL — ABNORMAL LOW (ref 11.6–15.9)
MCH: 37.6 pg — ABNORMAL HIGH (ref 25.1–34.0)
MCHC: 34.4 g/dL (ref 31.5–36.0)
MONO#: 1 10*3/uL — ABNORMAL HIGH (ref 0.1–0.9)
RDW: 13.6 % (ref 11.2–14.5)
WBC: 11.5 10*3/uL — ABNORMAL HIGH (ref 3.9–10.3)
lymph#: 4.6 10*3/uL — ABNORMAL HIGH (ref 0.9–3.3)
nRBC: 0 % (ref 0–0)

## 2012-12-04 LAB — TECHNOLOGIST REVIEW

## 2012-12-04 MED ORDER — CYANOCOBALAMIN 1000 MCG/ML IJ SOLN
1000.0000 ug | Freq: Once | INTRAMUSCULAR | Status: AC
  Administered 2012-12-04: 1000 ug via INTRAMUSCULAR

## 2012-12-04 NOTE — Patient Instructions (Addendum)

## 2012-12-08 ENCOUNTER — Telehealth: Payer: Self-pay

## 2012-12-08 NOTE — Telephone Encounter (Signed)
Told patient results od cbc as noted below by Dr. Darrold Span.  She is feeling reat and will keep appointments as scheduled for B 12  01-01-13 and follow up with Dr. Darrold Span  On Jan 27, 2013

## 2012-12-08 NOTE — Telephone Encounter (Signed)
Message copied by Lorine Bears on Tue Dec 08, 2012  1:58 PM ------      Message from: [name]      Created: Fri Dec 04, 2012  5:51 PM       Labs seen and need follow up: please let her know CBC about the same, Hgb 9.6. If she is feeling at baseline, keep appointments as scheduled including B12. ------

## 2013-01-01 ENCOUNTER — Ambulatory Visit (HOSPITAL_BASED_OUTPATIENT_CLINIC_OR_DEPARTMENT_OTHER): Payer: Medicare Other

## 2013-01-01 ENCOUNTER — Other Ambulatory Visit: Payer: Medicare Other | Admitting: Lab

## 2013-01-01 VITALS — BP 124/60 | HR 67 | Temp 98.4°F

## 2013-01-01 DIAGNOSIS — E538 Deficiency of other specified B group vitamins: Secondary | ICD-10-CM

## 2013-01-01 MED ORDER — CYANOCOBALAMIN 1000 MCG/ML IJ SOLN
1000.0000 ug | Freq: Once | INTRAMUSCULAR | Status: AC
  Administered 2013-01-01: 1000 ug via INTRAMUSCULAR

## 2013-01-27 ENCOUNTER — Encounter: Payer: Self-pay | Admitting: Oncology

## 2013-01-27 ENCOUNTER — Telehealth: Payer: Self-pay | Admitting: Oncology

## 2013-01-27 ENCOUNTER — Other Ambulatory Visit (HOSPITAL_BASED_OUTPATIENT_CLINIC_OR_DEPARTMENT_OTHER): Payer: Medicare Other | Admitting: Lab

## 2013-01-27 ENCOUNTER — Ambulatory Visit (HOSPITAL_BASED_OUTPATIENT_CLINIC_OR_DEPARTMENT_OTHER): Payer: Medicare Other | Admitting: Oncology

## 2013-01-27 ENCOUNTER — Ambulatory Visit: Payer: Medicare Other

## 2013-01-27 VITALS — BP 148/63 | HR 61 | Temp 97.0°F | Resp 19 | Ht 60.0 in | Wt 82.5 lb

## 2013-01-27 DIAGNOSIS — E538 Deficiency of other specified B group vitamins: Secondary | ICD-10-CM

## 2013-01-27 DIAGNOSIS — D591 Autoimmune hemolytic anemia, unspecified: Secondary | ICD-10-CM

## 2013-01-27 DIAGNOSIS — D5912 Cold autoimmune hemolytic anemia: Secondary | ICD-10-CM

## 2013-01-27 DIAGNOSIS — D649 Anemia, unspecified: Secondary | ICD-10-CM

## 2013-01-27 DIAGNOSIS — F17200 Nicotine dependence, unspecified, uncomplicated: Secondary | ICD-10-CM

## 2013-01-27 LAB — IRON AND TIBC
%SAT: 93 % — ABNORMAL HIGH (ref 20–55)
Iron: 308 ug/dL — ABNORMAL HIGH (ref 42–145)
TIBC: 330 ug/dL (ref 250–470)
UIBC: 22 ug/dL — ABNORMAL LOW (ref 125–400)

## 2013-01-27 LAB — CBC WITH DIFFERENTIAL/PLATELET
Eosinophils Absolute: 0 10*3/uL (ref 0.0–0.5)
HGB: 9.7 g/dL — ABNORMAL LOW (ref 11.6–15.9)
MONO#: 1 10*3/uL — ABNORMAL HIGH (ref 0.1–0.9)
NEUT#: 5.5 10*3/uL (ref 1.5–6.5)
RBC: 2.7 10*6/uL — ABNORMAL LOW (ref 3.70–5.45)
RDW: 13.4 % (ref 11.2–14.5)
WBC: 10.7 10*3/uL — ABNORMAL HIGH (ref 3.9–10.3)
lymph#: 4.1 10*3/uL — ABNORMAL HIGH (ref 0.9–3.3)
nRBC: 0 % (ref 0–0)

## 2013-01-27 MED ORDER — VITAMIN B-12 1000 MCG PO TABS: ORAL_TABLET | ORAL | Status: DC

## 2013-01-27 NOTE — Progress Notes (Signed)
OFFICE PROGRESS NOTE   01/27/2013   Physicians:W.Pharr, M.Magod   INTERVAL HISTORY:  Patient is seen, alone for visit, in follow up of cold agglutinin hemolytic anemia and having begun Vit B12 injections on 07-10-12 for slightly low B12. She is on supplemental folate. She has not had any new or different problems since she was seen last, and cannot tell any particular difference with the B12 injections. We have discussed the nationwide shortage of injectable B12 and I have recommended that we change her to oral B12, which she should be able to absorb. She had stopped smoking with nicotine patch for 3 weeks until she began smoking again last week, but will stop again now (!).   No recent fevers or symptoms of infection. No fevers, no increased SOB or cough, no chest pain, appetite at baseline, energy at baseline, no bleeding. No other different pain. Ongoing stress with care of elderly parents (93 and 97). Remainder of 10 point Review of Systems negative.  Objective:  Vital signs in last 24 hours:  BP 148/63  Pulse 61  Temp(Src) 97 F (36.1 C) (Oral)  Resp 19  Ht 5' (1.524 m)  Wt 82 lb 8 oz (37.422 kg)  BMI 16.11 kg/m2 Weight is down 1.5 lbs. Easily ambulatory, NAD, smells of cigarettes.   HEENT:PERRLA, sclera clear, anicteric and oropharynx clear, no lesions LymphaticsCervical, supraclavicular, and axillary nodes normal. Resp: hyperresonant to percussion and diminished BS bilaterally without wheezes or crackles Cardio: regular rate and rhythm GI: soft, non-tender; bowel sounds normal; no masses,  no organomegaly Extremities: extremities normal, atraumatic, no cyanosis or edema Neuro:no sensory deficits noted Breasts without dominant mass, skin or nipple findings  Skin without rash or ecchymosis  Lab Results:  Results for orders placed in visit on 01/27/13  CBC WITH DIFFERENTIAL      Result Value Range   WBC 10.7 (*) 3.9 - 10.3 10e3/uL   NEUT# 5.5  1.5 - 6.5 10e3/uL   HGB 9.7 (*) 11.6 - 15.9 g/dL   HCT 16.1 (*) 09.6 - 04.5 %   Platelets 452 (*) 145 - 400 10e3/uL   MCV 105.2 (*) 79.5 - 101.0 fL   MCH 35.9 (*) 25.1 - 34.0 pg   MCHC 34.2  31.5 - 36.0 g/dL   RBC 4.09 (*) 8.11 - 9.14 10e6/uL   RDW 13.4  11.2 - 14.5 %   lymph# 4.1 (*) 0.9 - 3.3 10e3/uL   MONO# 1.0 (*) 0.1 - 0.9 10e3/uL   Eosinophils Absolute 0.0  0.0 - 0.5 10e3/uL   Basophils Absolute 0.1  0.0 - 0.1 10e3/uL   NEUT% 51.2  38.4 - 76.8 %   LYMPH% 38.7  14.0 - 49.7 %   MONO% 9.2  0.0 - 14.0 %   EOS% 0.4  0.0 - 7.0 %   BASO% 0.5  0.0 - 2.0 %   nRBC 0  0 - 0 %   Hgb essentially stable, but MCV is lower than prior to the B12.  Studies/Results: Mammograms at [facility] 07-28-12 heterogeneously dense, otherwise negative   Medications: I have reviewed the patient's current medications.She will begin oral B12 1000 mcg daily in lieu of monthly injection.  Assessment/Plan: 1.Cold agglutinin hemolytic anemia: stable. Continue cold avoidance and supplemental folate. I will repeat SPEP at return visit as this has not been repeated recently. 2.slightly low B12 with macrocytic indices: continue B12 supplement now oral.  3.long tobacco abuse: this is the first time that she has been interested in or  tried to stop smoking. I have congratulated and encouraged her in this regard. 4.hx diverticulitis, not symptomatic 5.dense breast tissue on most recent mammograms: 3D/ tomo mammography would be appropriate for next imaging  Patient is in agreement with plan above. I will see her again in 6 months or sooner if needed. TIme spent 25 min.  LIVESAY,LENNIS P, MD   01/27/2013, 11:16 AM

## 2013-01-27 NOTE — Patient Instructions (Signed)
Will change to B12 tablets instead of injections, 1000 mcg daily

## 2013-01-27 NOTE — Telephone Encounter (Signed)
Gave pt appt for lab and MD on November 2014 °

## 2013-01-29 ENCOUNTER — Telehealth: Payer: Self-pay

## 2013-01-29 ENCOUNTER — Ambulatory Visit: Payer: Medicare Other

## 2013-01-29 NOTE — Telephone Encounter (Signed)
ENCOUNTER OPENED IN ERROR

## 2013-01-29 NOTE — Telephone Encounter (Signed)
Discussed rational for 3D imaging with Ms. Donate as noted below by Dr. Darrold Span.  Pt. Will have 3D imaging done as she stated that she did so with last mammogram.

## 2013-01-29 NOTE — Telephone Encounter (Signed)
Message copied by Lorine Bears on Fri Jan 29, 2013 10:57 AM ------      Message from: Reece Packer      Created: Thu Jan 28, 2013 10:37 PM       Please let her know that with next mammograms in Nov, it would be a good idea for her to have the 3D/ tomo mammograms as her breast tissue is very dense. This type of mammography gives much better images with dense breast tissue, takes just a couple of seconds longer than regular mammograms and gives just a little more radiation dose, but the images are so much better that it seems worth this at least to me. She likely will have to pay co pay up front, which the [facility] reimburses if insurance covers it. She just has to tell them when she registers for the mammograms that she wants 3D imaging. ------

## 2013-02-08 ENCOUNTER — Telehealth: Payer: Self-pay | Admitting: *Deleted

## 2013-02-08 NOTE — Telephone Encounter (Signed)
Pt notified of below.

## 2013-02-08 NOTE — Telephone Encounter (Signed)
Message copied by Phillis Knack on Mon Feb 08, 2013 10:03 AM ------      Message from: Jama Flavors P      Created: Thu Jan 28, 2013 10:37 PM       Please let her know that with next mammograms in Nov, it would be a good idea for her to have the 3D/ tomo mammograms as her breast tissue is very dense. This type of mammography gives much better images with dense breast tissue, takes just a couple of seconds longer than regular mammograms and gives just a little more radiation dose, but the images are so much better that it seems worth this at least to me. She likely will have to pay co pay up front, which the [facility] reimburses if insurance covers it. She just has to tell them when she registers for the mammograms that she wants 3D imaging. ------

## 2013-03-22 ENCOUNTER — Other Ambulatory Visit: Payer: Self-pay | Admitting: Oncology

## 2013-04-07 ENCOUNTER — Other Ambulatory Visit: Payer: Self-pay | Admitting: Dermatology

## 2013-07-06 ENCOUNTER — Other Ambulatory Visit: Payer: Self-pay

## 2013-07-06 DIAGNOSIS — Z1231 Encounter for screening mammogram for malignant neoplasm of breast: Secondary | ICD-10-CM

## 2013-07-24 ENCOUNTER — Other Ambulatory Visit: Payer: Self-pay | Admitting: Oncology

## 2013-07-24 DIAGNOSIS — D591 Autoimmune hemolytic anemia, unspecified: Secondary | ICD-10-CM

## 2013-07-30 ENCOUNTER — Ambulatory Visit (HOSPITAL_BASED_OUTPATIENT_CLINIC_OR_DEPARTMENT_OTHER): Payer: Medicare Other | Admitting: Internal Medicine

## 2013-07-30 ENCOUNTER — Ambulatory Visit (HOSPITAL_BASED_OUTPATIENT_CLINIC_OR_DEPARTMENT_OTHER): Payer: Medicare Other | Admitting: Lab

## 2013-07-30 ENCOUNTER — Telehealth: Payer: Self-pay | Admitting: Internal Medicine

## 2013-07-30 ENCOUNTER — Other Ambulatory Visit: Payer: Self-pay | Admitting: Internal Medicine

## 2013-07-30 VITALS — BP 149/55 | HR 60 | Temp 97.1°F | Resp 17 | Ht 60.0 in | Wt 76.2 lb

## 2013-07-30 DIAGNOSIS — D591 Autoimmune hemolytic anemia, unspecified: Secondary | ICD-10-CM

## 2013-07-30 DIAGNOSIS — R17 Unspecified jaundice: Secondary | ICD-10-CM

## 2013-07-30 DIAGNOSIS — Z720 Tobacco use: Secondary | ICD-10-CM

## 2013-07-30 DIAGNOSIS — D5912 Cold autoimmune hemolytic anemia: Secondary | ICD-10-CM

## 2013-07-30 DIAGNOSIS — E876 Hypokalemia: Secondary | ICD-10-CM

## 2013-07-30 DIAGNOSIS — F17200 Nicotine dependence, unspecified, uncomplicated: Secondary | ICD-10-CM

## 2013-07-30 LAB — COMPREHENSIVE METABOLIC PANEL (CC13)
ALT: 13 U/L (ref 0–55)
AST: 20 U/L (ref 5–34)
Albumin: 4.1 g/dL (ref 3.5–5.0)
Alkaline Phosphatase: 128 U/L (ref 40–150)
Anion Gap: 14 mEq/L — ABNORMAL HIGH (ref 3–11)
BUN: 9.1 mg/dL (ref 7.0–26.0)
CO2: 30 mEq/L — ABNORMAL HIGH (ref 22–29)
Calcium: 8.9 mg/dL (ref 8.4–10.4)
Chloride: 100 mEq/L (ref 98–109)
Creatinine: 0.6 mg/dL (ref 0.6–1.1)
Glucose: 98 mg/dl (ref 70–140)
Potassium: 2.8 mEq/L — CL (ref 3.5–5.1)
Sodium: 144 mEq/L (ref 136–145)
Total Bilirubin: 3.52 mg/dL — ABNORMAL HIGH (ref 0.20–1.20)
Total Protein: 6.5 g/dL (ref 6.4–8.3)

## 2013-07-30 LAB — CBC WITH DIFFERENTIAL/PLATELET
BASO%: 0.3 % (ref 0.0–2.0)
Basophils Absolute: 0 10*3/uL (ref 0.0–0.1)
EOS%: 0.3 % (ref 0.0–7.0)
Eosinophils Absolute: 0 10*3/uL (ref 0.0–0.5)
HCT: UNDETERMINED % (ref 34.8–46.6)
HGB: 9.6 g/dL — ABNORMAL LOW (ref 11.6–15.9)
LYMPH%: 44.7 % (ref 14.0–49.7)
MCH: UNDETERMINED pg (ref 25.1–34.0)
MCHC: UNDETERMINED g/dL (ref 31.5–36.0)
MCV: UNDETERMINED fL (ref 79.5–101.0)
MONO#: 0.9 10*3/uL (ref 0.1–0.9)
MONO%: 7.3 % (ref 0.0–14.0)
NEUT#: 5.5 10*3/uL (ref 1.5–6.5)
NEUT%: 47.4 % (ref 38.4–76.8)
Platelets: 381 10*3/uL (ref 145–400)
RBC: UNDETERMINED 10*6/uL (ref 3.70–5.45)
RDW: UNDETERMINED % (ref 11.2–14.5)
WBC: 11.6 10*3/uL — ABNORMAL HIGH (ref 3.9–10.3)
lymph#: 5.2 10*3/uL — ABNORMAL HIGH (ref 0.9–3.3)

## 2013-07-30 LAB — TECHNOLOGIST REVIEW

## 2013-07-30 MED ORDER — POTASSIUM CHLORIDE ER 10 MEQ PO TBCR: 10.0000 meq | EXTENDED_RELEASE_TABLET | Freq: Two times a day (BID) | ORAL | Status: DC

## 2013-07-30 NOTE — Telephone Encounter (Signed)
Gave pt appt for lab and MD on May 2014 and draw labs on 08/03/13

## 2013-07-30 NOTE — Patient Instructions (Signed)

## 2013-08-01 NOTE — Progress Notes (Signed)
[facility] OFFICE PROGRESS NOTE  Elaine Moh, MD [address] [place] [address]  DIAGNOSIS: Idiopathic chronic cold agglutinin disease - Plan: potassium chloride (K-DUR) 10 MEQ tablet, CBC with Differential, CBC with Differential, Comprehensive metabolic panel  Hypokalemia - Plan: Basic metabolic panel  Hyperbilirubinemia  Tobacco abuse  Chief Complaint  Patient presents with  . Idiopathic chronic cold agglutinin disease    CURRENT THERAPY:  Cold avoidance and supplemental folate 1 mg daily, oral vitamin B-12 1,000 mg tab daily. .  INTERVAL HISTORY: [name] 72 y.o. female with a history of cold agglutinin hemolytic anemia is here for follow-up.  She was last seen on 01/27/2013.  She began to have Vit B12 injections on 07-10-12 for slightly low B12. She is on supplemental folate. She has not had any new or different problems since she was seen last. She continues to decrease her smoking with nicotine patch for 3 weeks until she began smoking again last week. No recent fevers or symptoms of infection. No fevers, no increased SOB or cough, no chest pain, appetite at baseline, energy at baseline, no bleeding. No other different pain.   MEDICAL HISTORY:No past medical history on file.  INTERIM HISTORY: has Idiopathic chronic cold agglutinin disease; Tobacco abuse; Vitamin B 12 deficiency; Hypokalemia; and Hyperbilirubinemia on her problem list.    ALLERGIES:  has No Known Allergies.  MEDICATIONS: has a current medication list which includes the following prescription(s): [facility] vitamin b12, docusate sodium, folic acid, and potassium chloride.  SURGICAL HISTORY: No past surgical history on file.  REVIEW OF SYSTEMS:   Constitutional: Denies fevers, chills or abnormal weight loss Eyes: Denies blurriness of vision Ears, nose, mouth, throat, and face: Denies mucositis or sore throat Respiratory: Denies cough, dyspnea or  wheezes Cardiovascular: Denies palpitation, chest discomfort or lower extremity swelling Gastrointestinal:  Denies nausea, heartburn or change in bowel habits Skin: Denies abnormal skin rashes Lymphatics: Denies new lymphadenopathy or easy bruising Neurological:Denies numbness, tingling or new weaknesses Behavioral/Psych: Mood is stable, no new changes  All other systems were reviewed with the patient and are negative.  PHYSICAL EXAMINATION: ECOG PERFORMANCE STATUS: 0 - Asymptomatic  Blood pressure 149/55, pulse 60, temperature 97.1 F (36.2 C), temperature source Oral, resp. rate 17, height 5' (1.524 m), weight 76 lb 3.2 oz (34.564 kg).  GENERAL:alert, no distress and comfortable; thin  Elderly female.  SKIN: skin color, texture, turgor are normal, no rashes or significant lesions EYES: normal, Conjunctiva are pink and non-injected, sclera clear OROPHARYNX:no exudate, no erythema and lips, buccal mucosa, and tongue normal  NECK: supple, thyroid normal size, non-tender, without nodularity LYMPH:  no palpable lymphadenopathy in the cervical, axillary or supraclavicular LUNGS: clear to auscultation and percussion with normal breathing effort; prolonged expiratory phase.  HEART: regular rate & rhythm and no murmurs and no lower extremity edema ABDOMEN:abdomen soft, non-tender and normal bowel sounds Musculoskeletal:no cyanosis of digits and no clubbing  NEURO: alert & oriented x 3 with fluent speech, no focal motor/sensory deficits  LABORATORY DATA: Results for orders placed in visit on 07/30/13 (from the past 48 hour(s))  CBC WITH DIFFERENTIAL     Status: Abnormal   Collection Time    07/30/13  1:21 PM      Result Value Range   WBC 11.6 (*) 3.9 - 10.3 10e3/uL   NEUT# 5.5  1.5 - 6.5 10e3/uL   HGB 9.6 (*) 11.6 - 15.9 g/dL   HCT Unable to determine  34.8 -  46.6 %   Platelets 381  145 - 400 10e3/uL   MCV Unable to determine  79.5 - 101.0 fL   MCH Unable to determine  25.1 - 34.0 pg    MCHC Unable to determine  31.5 - 36.0 g/dL   RBC Unable to determine  3.70 - 5.45 10e6/uL   RDW Unable to Determine  11.2 - 14.5 %   lymph# 5.2 (*) 0.9 - 3.3 10e3/uL   MONO# 0.9  0.1 - 0.9 10e3/uL   Eosinophils Absolute 0.0  0.0 - 0.5 10e3/uL   Basophils Absolute 0.0  0.0 - 0.1 10e3/uL   NEUT% 47.4  38.4 - 76.8 %   LYMPH% 44.7  14.0 - 49.7 %   MONO% 7.3  0.0 - 14.0 %   EOS% 0.3  0.0 - 7.0 %   BASO% 0.3  0.0 - 2.0 %  TECHNOLOGIST REVIEW     Status: None   Collection Time    07/30/13  1:21 PM      Result Value Range   Technologist Review Occ Variant lymphs present    COMPREHENSIVE METABOLIC PANEL (CC13)     Status: Abnormal   Collection Time    07/30/13  1:21 PM      Result Value Range   Sodium 144  136 - 145 mEq/L   Potassium 2.8 (*) 3.5 - 5.1 mEq/L   Chloride 100  98 - 109 mEq/L   CO2 30 (*) 22 - 29 mEq/L   Glucose 98  70 - 140 mg/dl   BUN 9.1  7.0 - 30.8 mg/dL   Creatinine 0.6  0.6 - 1.1 mg/dL   Total Bilirubin 6.57 (*) 0.20 - 1.20 mg/dL   Alkaline Phosphatase 128  40 - 150 U/L   AST 20  5 - 34 U/L   ALT 13  0 - 55 U/L   Total Protein 6.5  6.4 - 8.3 g/dL   Albumin 4.1  3.5 - 5.0 g/dL   Calcium 8.9  8.4 - 84.6 mg/dL   Anion Gap 14 (*) 3 - 11 mEq/L    Labs:  Lab Results  Component Value Date   WBC 11.6* 07/30/2013   HGB 9.6* 07/30/2013   HCT Unable to determine 07/30/2013   MCV Unable to determine 07/30/2013   PLT 381 07/30/2013   NEUTROABS 5.5 07/30/2013      Chemistry      Component Value Date/Time   NA 144 07/30/2013 1321   NA 140 11/09/2010 1042   K 2.8* 07/30/2013 1321   K 3.9 11/09/2010 1042   CL 101 08/07/2012 1127   CL 100 11/09/2010 1042   CO2 30* 07/30/2013 1321   CO2 30 11/09/2010 1042   BUN 9.1 07/30/2013 1321   BUN 11 11/09/2010 1042   CREATININE 0.6 07/30/2013 1321   CREATININE 0.49 11/09/2010 1042      Component Value Date/Time   CALCIUM 8.9 07/30/2013 1321   CALCIUM 8.7 11/09/2010 1042   ALKPHOS 128 07/30/2013 1321   ALKPHOS 124* 11/09/2010 1042   AST  20 07/30/2013 1321   AST 26 11/09/2010 1042   ALT 13 07/30/2013 1321   ALT 16 11/09/2010 1042   BILITOT 3.52* 07/30/2013 1321   BILITOT 2.4* 11/09/2010 1042      Basic Metabolic Panel:  Recent Labs Lab 07/30/13 1321  NA 144  K 2.8*  CO2 30*  GLUCOSE 98  BUN 9.1  CREATININE 0.6  CALCIUM 8.9   GFR Estimated Creatinine Clearance: 35.2 ml/min (  by C-G formula based on Cr of 0.6). Liver Function Tests:  Recent Labs Lab 07/30/13 1321  AST 20  ALT 13  ALKPHOS 128  BILITOT 3.52*  PROT 6.5  ALBUMIN 4.1   CBC:  Recent Labs Lab 07/30/13 1321  WBC 11.6*  NEUTROABS 5.5  HGB 9.6*  HCT Unable to determine  MCV Unable to determine  PLT 381   Microbiology Recent Results (from the past 240 hour(s))  TECHNOLOGIST REVIEW     Status: None   Collection Time    07/30/13  1:21 PM      Result Value Range Status   Technologist Review Occ Variant lymphs present   Final    Studies:  No results found.   RADIOGRAPHIC STUDIES: No results found.  ASSESSMENT: [name] 72 y.o. female with a history of Idiopathic chronic cold agglutinin disease - Plan: potassium chloride (K-DUR) 10 MEQ tablet, CBC with Differential, CBC with Differential, Comprehensive metabolic panel  Hypokalemia - Plan: Basic metabolic panel  Hyperbilirubinemia  Tobacco abuse   PLAN:  1.Cold agglutinin hemolytic anemia: stable. Continue cold avoidance and supplemental folate. 2.H.o. slightly low B12 with macrocytic indices: continue B12 supplement now oral.  Await repeat labs next week secondary unable to detect today's indices.  3.long tobacco abuse: She does the nicotine patch at times but admits to smoking at times but have reduced the amount that she smokes overall.   4.hx diverticulitis, not symptomatic.  Continue high fiber diet.  5.dense breast tissue on most recent mammograms: 3D/ tomo mammography would be appropriate for next imaging 6. Hyperbilirubinemia. Slightly increased, we will await  repeat labs.  She does have jaundice likely secondary to #1.  7. Hypokalemia. She denies vomiting or diarrhea.  K-Dur 10 mEq bid for 4 days and repeat to ensure improvement.  Given handout about hypokalemia.   Likely secondary to poor oral intake.  8.Follow-up.  Repeat labs early next week and then follow-up in 6 months with chemistries and CBC.   All questions were answered. The patient knows to call the clinic with any problems, questions or concerns. We can certainly see the patient much sooner if necessary.  I spent 15 minutes counseling the patient face to face. The total time spent in the appointment was 25 minutes.    Elaine Hawks, MD 08/01/2013 10:18 AM

## 2013-08-02 ENCOUNTER — Ambulatory Visit
Admission: RE | Admit: 2013-08-02 | Discharge: 2013-08-02 | Disposition: A | Discharge status: Home / Self Care | Payer: Medicare Other | Source: Ambulatory Visit

## 2013-08-02 DIAGNOSIS — Z1231 Encounter for screening mammogram for malignant neoplasm of breast: Secondary | ICD-10-CM

## 2013-08-03 ENCOUNTER — Other Ambulatory Visit (HOSPITAL_BASED_OUTPATIENT_CLINIC_OR_DEPARTMENT_OTHER): Payer: Medicare Other | Admitting: Lab

## 2013-08-03 DIAGNOSIS — D591 Autoimmune hemolytic anemia, unspecified: Secondary | ICD-10-CM

## 2013-08-03 DIAGNOSIS — E876 Hypokalemia: Secondary | ICD-10-CM

## 2013-08-03 DIAGNOSIS — D5912 Cold autoimmune hemolytic anemia: Secondary | ICD-10-CM

## 2013-08-03 LAB — CBC WITH DIFFERENTIAL/PLATELET
Basophils Absolute: 0.1 10*3/uL (ref 0.0–0.1)
EOS%: 0.4 % (ref 0.0–7.0)
HCT: 28 % — ABNORMAL LOW (ref 34.8–46.6)
HGB: 9.9 g/dL — ABNORMAL LOW (ref 11.6–15.9)
MCH: 39.6 pg — ABNORMAL HIGH (ref 25.1–34.0)
MCV: 112 fL — ABNORMAL HIGH (ref 79.5–101.0)
MONO%: 9 % (ref 0.0–14.0)
NEUT#: 5.4 10*3/uL (ref 1.5–6.5)
NEUT%: 39.8 % (ref 38.4–76.8)
lymph#: 6.9 10*3/uL — ABNORMAL HIGH (ref 0.9–3.3)

## 2013-08-03 LAB — BASIC METABOLIC PANEL (CC13)
Anion Gap: 9 mEq/L (ref 3–11)
BUN: 7.7 mg/dL (ref 7.0–26.0)
Calcium: 9.2 mg/dL (ref 8.4–10.4)
Chloride: 100 mEq/L (ref 98–109)
Glucose: 82 mg/dl (ref 70–140)
Potassium: 3.9 mEq/L (ref 3.5–5.1)

## 2013-08-03 LAB — PROTEIN ELECTROPHORESIS, SERUM, WITH REFLEX
Alpha-1-Globulin: 3.9 % (ref 2.9–4.9)
Beta 2: 2.3 % — ABNORMAL LOW (ref 3.2–6.5)
Beta Globulin: 12.3 % — ABNORMAL HIGH (ref 4.7–7.2)
Gamma Globulin: 7.7 % — ABNORMAL LOW (ref 11.1–18.8)

## 2013-08-03 LAB — IFE INTERPRETATION

## 2013-08-03 LAB — IGG, IGA, IGM
IgA: 175 mg/dL (ref 69–380)
IgM, Serum: 645 mg/dL — ABNORMAL HIGH (ref 52–322)

## 2013-08-06 ENCOUNTER — Telehealth: Payer: Self-pay

## 2013-08-06 NOTE — Telephone Encounter (Signed)
S/w pt about lab results from 08/03/13. Informed her of next appt in May 2015

## 2013-09-20 ENCOUNTER — Other Ambulatory Visit: Payer: Self-pay | Admitting: Oncology

## 2014-01-24 ENCOUNTER — Other Ambulatory Visit: Payer: Self-pay | Admitting: *Deleted

## 2014-01-24 DIAGNOSIS — D591 Autoimmune hemolytic anemia, unspecified: Secondary | ICD-10-CM

## 2014-01-24 MED ORDER — CYANOCOBALAMIN 1000 MCG PO TABS: ORAL_TABLET | ORAL | Status: DC

## 2014-01-27 ENCOUNTER — Ambulatory Visit (HOSPITAL_BASED_OUTPATIENT_CLINIC_OR_DEPARTMENT_OTHER): Payer: Medicare Other | Admitting: Internal Medicine

## 2014-01-27 ENCOUNTER — Ambulatory Visit (HOSPITAL_BASED_OUTPATIENT_CLINIC_OR_DEPARTMENT_OTHER): Payer: Medicare Other

## 2014-01-27 ENCOUNTER — Encounter: Payer: Self-pay | Admitting: Internal Medicine

## 2014-01-27 ENCOUNTER — Telehealth: Payer: Self-pay | Admitting: Internal Medicine

## 2014-01-27 VITALS — BP 112/58 | HR 71 | Temp 97.7°F | Resp 18 | Ht 60.0 in | Wt 82.7 lb

## 2014-01-27 DIAGNOSIS — E785 Hyperlipidemia, unspecified: Secondary | ICD-10-CM

## 2014-01-27 DIAGNOSIS — R17 Unspecified jaundice: Secondary | ICD-10-CM

## 2014-01-27 DIAGNOSIS — D591 Autoimmune hemolytic anemia, unspecified: Secondary | ICD-10-CM

## 2014-01-27 DIAGNOSIS — D5912 Cold autoimmune hemolytic anemia: Secondary | ICD-10-CM

## 2014-01-27 DIAGNOSIS — F17200 Nicotine dependence, unspecified, uncomplicated: Secondary | ICD-10-CM

## 2014-01-27 LAB — CBC WITH DIFFERENTIAL/PLATELET
BASO%: 0.8 % (ref 0.0–2.0)
BASOS ABS: 0.1 10*3/uL (ref 0.0–0.1)
EOS%: 0.9 % (ref 0.0–7.0)
Eosinophils Absolute: 0.1 10*3/uL (ref 0.0–0.5)
HEMATOCRIT: 27.3 % — AB (ref 34.8–46.6)
HGB: 9.7 g/dL — ABNORMAL LOW (ref 11.6–15.9)
LYMPH%: 42.8 % (ref 14.0–49.7)
MCH: 41.6 pg — ABNORMAL HIGH (ref 25.1–34.0)
MCHC: 35.6 g/dL (ref 31.5–36.0)
MCV: 117.1 fL — AB (ref 79.5–101.0)
MONO#: 0.8 10*3/uL (ref 0.1–0.9)
MONO%: 6.7 % (ref 0.0–14.0)
NEUT%: 48.8 % (ref 38.4–76.8)
NEUTROS ABS: 5.7 10*3/uL (ref 1.5–6.5)
PLATELETS: 447 10*3/uL — AB (ref 145–400)
RBC: 2.33 10*6/uL — AB (ref 3.70–5.45)
RDW: 13.8 % (ref 11.2–14.5)
WBC: 11.8 10*3/uL — ABNORMAL HIGH (ref 3.9–10.3)
lymph#: 5 10*3/uL — ABNORMAL HIGH (ref 0.9–3.3)
nRBC: 0 % (ref 0–0)

## 2014-01-27 LAB — COMPREHENSIVE METABOLIC PANEL (CC13)
ALT: 7 U/L (ref 0–55)
ANION GAP: 12 meq/L — AB (ref 3–11)
AST: 17 U/L (ref 5–34)
Albumin: 4 g/dL (ref 3.5–5.0)
Alkaline Phosphatase: 132 U/L (ref 40–150)
BILIRUBIN TOTAL: 3.16 mg/dL — AB (ref 0.20–1.20)
BUN: 12.7 mg/dL (ref 7.0–26.0)
CALCIUM: 9.2 mg/dL (ref 8.4–10.4)
CHLORIDE: 104 meq/L (ref 98–109)
CO2: 30 meq/L — AB (ref 22–29)
Creatinine: 0.6 mg/dL (ref 0.6–1.1)
GLUCOSE: 122 mg/dL (ref 70–140)
Potassium: 3.4 mEq/L — ABNORMAL LOW (ref 3.5–5.1)
Sodium: 146 mEq/L — ABNORMAL HIGH (ref 136–145)
Total Protein: 6.7 g/dL (ref 6.4–8.3)

## 2014-01-27 NOTE — Telephone Encounter (Signed)
gv adn prnted appt sched adn avs for pt for NOV

## 2014-01-27 NOTE — Progress Notes (Signed)
[facility] OFFICE PROGRESS NOTE  Horatio Pel, MD [place] [address]  DIAGNOSIS: Idiopathic chronic cold agglutinin disease - Plan: CBC with Differential, Comprehensive metabolic panel (Cmet) - [facility]  Hyperbilirubinemia  Chief Complaint  Patient presents with  . Idiopathic chronic cold agglutinin disease    CURRENT THERAPY:  Cold avoidance and supplemental folate 1 mg daily, oral vitamin B-12 1,000 mg tab daily. .  INTERVAL HISTORY: Raylei Cunningham Bednarczyk 73 y.o. female with a history of cold agglutinin hemolytic anemia is here for follow-up.  She was last seen on 07/30/2013.  She began to have Vit B12 injections on 07-10-12 for slightly low B12. She is on supplemental folate. She has not had any new or different problems since she was seen last. No recent fevers or symptoms of infection. No fevers, no increased SOB or cough, no chest pain, appetite at baseline, energy at baseline, no bleeding. No other different pain.   MEDICAL HISTORY:No past medical history on file.  INTERIM HISTORY: has Idiopathic chronic cold agglutinin disease; Tobacco abuse; Vitamin B 12 deficiency; Hypokalemia; and Hyperbilirubinemia on her problem list.    ALLERGIES:  has No Known Allergies.  MEDICATIONS: has a current medication list which includes the following prescription(s): cyanocobalamin, docusate sodium, and folic acid.  SURGICAL HISTORY: No past surgical history on file.  REVIEW OF SYSTEMS:   Constitutional: Denies fevers, chills or abnormal weight loss Eyes: Denies blurriness of vision Ears, nose, mouth, throat, and face: Denies mucositis or sore throat Respiratory: Denies cough, dyspnea or wheezes Cardiovascular: Denies palpitation, chest discomfort or lower extremity swelling Gastrointestinal:  Denies nausea, heartburn or change in bowel habits Skin: Denies abnormal skin rashes Lymphatics: Denies new lymphadenopathy or easy  bruising Neurological:Denies numbness, tingling or new weaknesses Behavioral/Psych: Mood is stable, no new changes  All other systems were reviewed with the patient and are negative.  PHYSICAL EXAMINATION: ECOG PERFORMANCE STATUS: 0 - Asymptomatic  Blood pressure 112/58, pulse 71, temperature 97.7 F (36.5 C), temperature source Oral, resp. rate 18, height 5' (1.524 m), weight 82 lb 11.2 oz (37.512 kg).  GENERAL:alert, no distress and comfortable; thin  Elderly female.  SKIN: skin color, texture, turgor are normal, no rashes or significant lesions EYES: normal, Conjunctiva are pink and non-injected, sclera clear OROPHARYNX:no exudate, no erythema and lips, buccal mucosa, and tongue normal  NECK: supple, thyroid normal size, non-tender, without nodularity LYMPH:  no palpable lymphadenopathy in the cervical, axillary or supraclavicular LUNGS: clear to auscultation and percussion with normal breathing effort; prolonged expiratory phase.  HEART: regular rate & rhythm and no murmurs and no lower extremity edema ABDOMEN:abdomen soft, non-tender and normal bowel sounds Musculoskeletal:no cyanosis of digits and no clubbing  NEURO: alert & oriented x 3 with fluent speech, no focal motor/sensory deficits  LABORATORY DATA: Results for orders placed in visit on 01/27/14 (from the past 48 hour(s))  CBC WITH DIFFERENTIAL     Status: Abnormal   Collection Time    01/27/14  2:52 PM      Result Value Ref Range   WBC 11.8 (*) 3.9 - 10.3 10e3/uL   NEUT# 5.7  1.5 - 6.5 10e3/uL   HGB 9.7 (*) 11.6 - 15.9 g/dL   HCT 27.3 (*) 34.8 - 46.6 %   Platelets 447 (*) 145 - 400 10e3/uL   MCV 117.1 (*) 79.5 - 101.0 fL   MCH 41.6 (*) 25.1 - 34.0 pg   MCHC 35.6  31.5 - 36.0 g/dL  RBC 2.33 (*) 3.70 - 5.45 10e6/uL   RDW 13.8  11.2 - 14.5 %   lymph# 5.0 (*) 0.9 - 3.3 10e3/uL   MONO# 0.8  0.1 - 0.9 10e3/uL   Eosinophils Absolute 0.1  0.0 - 0.5 10e3/uL   Basophils Absolute 0.1  0.0 - 0.1 10e3/uL   NEUT% 48.8   38.4 - 76.8 %   LYMPH% 42.8  14.0 - 49.7 %   MONO% 6.7  0.0 - 14.0 %   EOS% 0.9  0.0 - 7.0 %   BASO% 0.8  0.0 - 2.0 %   nRBC 0  0 - 0 %  COMPREHENSIVE METABOLIC PANEL (QI34)     Status: Abnormal   Collection Time    01/27/14  2:52 PM      Result Value Ref Range   Sodium 146 (*) 136 - 145 mEq/L   Potassium 3.4 (*) 3.5 - 5.1 mEq/L   Chloride 104  98 - 109 mEq/L   CO2 30 (*) 22 - 29 mEq/L   Glucose 122  70 - 140 mg/dl   BUN 12.7  7.0 - 26.0 mg/dL   Creatinine 0.6  0.6 - 1.1 mg/dL   Total Bilirubin 3.16 (*) 0.20 - 1.20 mg/dL   Alkaline Phosphatase 132  40 - 150 U/L   AST 17  5 - 34 U/L   ALT 7  0 - 55 U/L   Total Protein 6.7  6.4 - 8.3 g/dL   Albumin 4.0  3.5 - 5.0 g/dL   Calcium 9.2  8.4 - 10.4 mg/dL   Anion Gap 12 (*) 3 - 11 mEq/L    Labs:  Lab Results  Component Value Date   WBC 11.8* 01/27/2014   HGB 9.7* 01/27/2014   HCT 27.3* 01/27/2014   MCV 117.1* 01/27/2014   PLT 447* 01/27/2014   NEUTROABS 5.7 01/27/2014      Chemistry      Component Value Date/Time   NA 146* 01/27/2014 1452   NA 140 11/09/2010 1042   K 3.4* 01/27/2014 1452   K 3.9 11/09/2010 1042   CL 101 08/07/2012 1127   CL 100 11/09/2010 1042   CO2 30* 01/27/2014 1452   CO2 30 11/09/2010 1042   BUN 12.7 01/27/2014 1452   BUN 11 11/09/2010 1042   CREATININE 0.6 01/27/2014 1452   CREATININE 0.49 11/09/2010 1042      Component Value Date/Time   CALCIUM 9.2 01/27/2014 1452   CALCIUM 8.7 11/09/2010 1042   ALKPHOS 132 01/27/2014 1452   ALKPHOS 124* 11/09/2010 1042   AST 17 01/27/2014 1452   AST 26 11/09/2010 1042   ALT 7 01/27/2014 1452   ALT 16 11/09/2010 1042   BILITOT 3.16* 01/27/2014 1452   BILITOT 2.4* 11/09/2010 1042     CBC:  Recent Labs Lab 01/27/14 1452  WBC 11.8*  NEUTROABS 5.7  HGB 9.7*  HCT 27.3*  MCV 117.1*  PLT 447*   Microbiology No results found for this or any previous visit (from the past 240 hour(s)).  Studies:  No results found.   RADIOGRAPHIC STUDIES: No results found.  ASSESSMENT: [name]  Counts 73 y.o. female with a history of Idiopathic chronic cold agglutinin disease - Plan: CBC with Differential, Comprehensive metabolic panel (Cmet) - [facility]  Hyperbilirubinemia   PLAN:  1.Cold agglutinin hemolytic anemia: stable. Continue cold avoidance and supplemental folate. 2.H.o. slightly low B12 with macrocytic indices: continue B12 supplement now oral. .  3.long tobacco abuse: She does the nicotine  patch at times but admits to smoking at times but have reduced the amount that she smokes overall.  She smokes about 1/2 pack every other day.  4.hx diverticulitis, not symptomatic.  Continue high fiber diet.  5.dense breast tissue on most recent mammograms: 3D/ tomo mammography would be appropriate for next imaging 6. Hyperbilirubinemia. Slightly increased.  She does have jaundice likely secondary to #1.  7. Hypokalemia. She denies vomiting or diarrhea.  Given handout about hypokalemia.   Likely secondary to poor oral intake. Counseled to supplement her diet with potassium rich foods. 8.Follow-up. Follow-up in 6 months with chemistries and CBC.   All questions were answered. The patient knows to call the clinic with any problems, questions or concerns. We can certainly see the patient much sooner if necessary.  I spent 15 minutes counseling the patient face to face. The total time spent in the appointment was 25 minutes.    Concha Norway, MD 01/28/2014 6:28 AM

## 2014-02-01 LAB — PROTEIN ELECTROPHORESIS, SERUM, WITH REFLEX
Albumin ELP: 61.8 % (ref 55.8–66.1)
Alpha-1-Globulin: 6.5 % — ABNORMAL HIGH (ref 2.9–4.9)
Alpha-2-Globulin: 7.5 % (ref 7.1–11.8)
Beta 2: 2.7 % — ABNORMAL LOW (ref 3.2–6.5)
Beta Globulin: 14.5 % — ABNORMAL HIGH (ref 4.7–7.2)
GAMMA GLOBULIN: 7 % — AB (ref 11.1–18.8)
M-Spike, %: 0.62 g/dL
Total Protein, Serum Electrophoresis: 6.5 g/dL (ref 6.0–8.3)

## 2014-02-01 LAB — IGG, IGA, IGM
IGG (IMMUNOGLOBIN G), SERUM: 420 mg/dL — AB (ref 690–1700)
IGM, SERUM: 779 mg/dL — AB (ref 52–322)
IgA: 196 mg/dL (ref 69–380)

## 2014-02-01 LAB — IFE INTERPRETATION

## 2014-02-23 ENCOUNTER — Other Ambulatory Visit: Payer: Self-pay | Admitting: Internal Medicine

## 2014-03-25 ENCOUNTER — Other Ambulatory Visit: Payer: Self-pay | Admitting: Internal Medicine

## 2014-04-21 ENCOUNTER — Other Ambulatory Visit: Payer: Self-pay | Admitting: *Deleted

## 2014-04-21 MED ORDER — FOLIC ACID 1 MG PO TABS: ORAL_TABLET | ORAL | Status: DC

## 2014-04-27 ENCOUNTER — Other Ambulatory Visit: Payer: Self-pay | Admitting: Internal Medicine

## 2014-05-31 ENCOUNTER — Other Ambulatory Visit: Payer: Self-pay | Admitting: *Deleted

## 2014-05-31 DIAGNOSIS — D591 Autoimmune hemolytic anemia, unspecified: Secondary | ICD-10-CM

## 2014-05-31 MED ORDER — CYANOCOBALAMIN ER 1000 MCG PO TBCR: EXTENDED_RELEASE_TABLET | ORAL | Status: DC

## 2014-06-18 ENCOUNTER — Other Ambulatory Visit: Payer: Self-pay | Admitting: Internal Medicine

## 2014-08-03 ENCOUNTER — Other Ambulatory Visit: Payer: Self-pay

## 2014-08-03 DIAGNOSIS — D591 Other autoimmune hemolytic anemias: Principal | ICD-10-CM

## 2014-08-03 DIAGNOSIS — D5912 Cold autoimmune hemolytic anemia: Secondary | ICD-10-CM

## 2014-08-04 ENCOUNTER — Telehealth: Payer: Self-pay | Admitting: Oncology

## 2014-08-04 ENCOUNTER — Other Ambulatory Visit: Payer: Medicare Other

## 2014-08-04 ENCOUNTER — Ambulatory Visit: Payer: Medicare Other

## 2014-08-21 ENCOUNTER — Other Ambulatory Visit: Payer: Self-pay | Admitting: Oncology

## 2014-08-21 DIAGNOSIS — D591 Other autoimmune hemolytic anemias: Principal | ICD-10-CM

## 2014-08-21 DIAGNOSIS — E538 Deficiency of other specified B group vitamins: Secondary | ICD-10-CM

## 2014-08-21 DIAGNOSIS — D5912 Cold autoimmune hemolytic anemia: Secondary | ICD-10-CM

## 2014-08-21 DIAGNOSIS — E876 Hypokalemia: Secondary | ICD-10-CM

## 2014-08-28 ENCOUNTER — Other Ambulatory Visit: Payer: Self-pay | Admitting: Hematology

## 2014-08-29 ENCOUNTER — Ambulatory Visit (HOSPITAL_BASED_OUTPATIENT_CLINIC_OR_DEPARTMENT_OTHER): Payer: Medicare Other | Admitting: Oncology

## 2014-08-29 ENCOUNTER — Other Ambulatory Visit (HOSPITAL_BASED_OUTPATIENT_CLINIC_OR_DEPARTMENT_OTHER): Payer: Medicare Other

## 2014-08-29 ENCOUNTER — Encounter: Payer: Self-pay | Admitting: Oncology

## 2014-08-29 ENCOUNTER — Other Ambulatory Visit: Payer: Self-pay

## 2014-08-29 ENCOUNTER — Telehealth: Payer: Self-pay | Admitting: Oncology

## 2014-08-29 VITALS — BP 122/53 | HR 70 | Temp 98.2°F | Resp 18 | Ht 60.0 in | Wt 82.3 lb

## 2014-08-29 DIAGNOSIS — E538 Deficiency of other specified B group vitamins: Secondary | ICD-10-CM

## 2014-08-29 DIAGNOSIS — D5912 Cold autoimmune hemolytic anemia: Secondary | ICD-10-CM

## 2014-08-29 DIAGNOSIS — D591 Other autoimmune hemolytic anemias: Secondary | ICD-10-CM

## 2014-08-29 DIAGNOSIS — Z1231 Encounter for screening mammogram for malignant neoplasm of breast: Secondary | ICD-10-CM

## 2014-08-29 DIAGNOSIS — E876 Hypokalemia: Secondary | ICD-10-CM

## 2014-08-29 LAB — CBC WITH DIFFERENTIAL/PLATELET
BASO%: 0.6 % (ref 0.0–2.0)
BASOS ABS: 0.1 10*3/uL (ref 0.0–0.1)
EOS ABS: 0.1 10*3/uL (ref 0.0–0.5)
EOS%: 0.6 % (ref 0.0–7.0)
HEMATOCRIT: 28.1 % — AB (ref 34.8–46.6)
HEMOGLOBIN: 9.8 g/dL — AB (ref 11.6–15.9)
LYMPH#: 5.8 10*3/uL — AB (ref 0.9–3.3)
LYMPH%: 47.7 % (ref 14.0–49.7)
MCH: 38.7 pg — AB (ref 25.1–34.0)
MCHC: 34.9 g/dL (ref 31.5–36.0)
MCV: 111.1 fL — ABNORMAL HIGH (ref 79.5–101.0)
MONO#: 1.3 10*3/uL — ABNORMAL HIGH (ref 0.1–0.9)
MONO%: 10.5 % (ref 0.0–14.0)
NEUT%: 40.6 % (ref 38.4–76.8)
NEUTROS ABS: 4.9 10*3/uL (ref 1.5–6.5)
Platelets: 392 10*3/uL (ref 145–400)
RBC: 2.53 10*6/uL — ABNORMAL LOW (ref 3.70–5.45)
RDW: 15.2 % — AB (ref 11.2–14.5)
WBC: 12.1 10*3/uL — ABNORMAL HIGH (ref 3.9–10.3)
nRBC: 0 % (ref 0–0)

## 2014-08-29 LAB — TECHNOLOGIST REVIEW

## 2014-08-29 LAB — COMPREHENSIVE METABOLIC PANEL (CC13)
ALBUMIN: 3.9 g/dL (ref 3.5–5.0)
ALK PHOS: 143 U/L (ref 40–150)
ALT: 15 U/L (ref 0–55)
ANION GAP: 9 meq/L (ref 3–11)
AST: 20 U/L (ref 5–34)
BUN: 10.5 mg/dL (ref 7.0–26.0)
CO2: 30 mEq/L — ABNORMAL HIGH (ref 22–29)
Calcium: 8.8 mg/dL (ref 8.4–10.4)
Chloride: 102 mEq/L (ref 98–109)
Creatinine: 0.6 mg/dL (ref 0.6–1.1)
EGFR: 89 mL/min/{1.73_m2} — ABNORMAL LOW (ref >90)
GLUCOSE: 88 mg/dL (ref 70–140)
POTASSIUM: 3.9 meq/L (ref 3.5–5.1)
Sodium: 141 mEq/L (ref 136–145)
Total Bilirubin: 2.1 mg/dL — ABNORMAL HIGH (ref 0.20–1.20)
Total Protein: 6.3 g/dL — ABNORMAL LOW (ref 6.4–8.3)

## 2014-08-29 NOTE — Telephone Encounter (Signed)
per pof to sch pt appt-[facility] not opened-adv pt will call to sch appt once [facility] opened

## 2014-08-29 NOTE — Progress Notes (Signed)
OFFICE PROGRESS NOTE   08/29/2014   Physicians: M.Magod. No PCP per patient.  INTERVAL HISTORY:  Patient is seen, for first time back by this MD since 01-2013, having been followed in interim at this office by Dr Concha Norway for cold agglutinin disease. She remains on chronic folic acid supplement due to hemolysis from the cold agglutinin. She had slightly low B12 in past, presently on B12 oral 1000 mg daily.  She has had no subjective changes in health in last several months. She tries to stay warm, denies increased fatigue or increased SOB with exertion. She has decreased cigarettes down to 1-2 cigarettes per week for last 4+ months, is using nicotine patch. She denies new or different pain. Appetite and energy are at baseline.  She has had no bleeding  She does not have PAC She had flu vaccine  Review of systems as above, also: No recent fever or symptoms of infection. Denies increased cough or SOB. Denies new or different pain. No bleeding.  Remainder of 10 point Review of Systems negative.  Objective:  Vital signs in last 24 hours:  BP 122/53 mmHg  Pulse 70  Temp(Src) 98.2 F (36.8 C) (Oral)  Resp 18  Ht 5' (1.524 m)  Wt 82 lb 4.8 oz (37.331 kg)  BMI 16.07 kg/m2  SpO2 97% weight is stable  Alert, oriented and appropriate. Ambulatory without difficulty.   HEENT:PERRL, sclerae not icteric. Oral mucosa moist without lesions, posterior pharynx clear.  Neck supple. No JVD.  Lymphatics:no cervical,suraclavicular, axillary or inguinal adenopathy Resp: diminished BS thruout without wheeze or crackles. No dullness to percussion Cardio: regular rate and rhythm. No gallop. GI: abbdomen scaphoidm soft, nontender, not distended, no mass or organomegaly. Normally active bowel sounds.  Musculoskeletal/ Extremities: without pitting edema, cords, tenderness Neuro:  nonfocal PSYCH apprepriate mood and affect Skin without rash, ecchymosis, petechiae   Lab Results:  Results for orders  placed or performed in visit on 08/29/14  CBC with Differential  Result Value Ref Range   WBC 12.1 (H) 3.9 - 10.3 10e3/uL   NEUT# 4.9 1.5 - 6.5 10e3/uL   HGB 9.8 (L) 11.6 - 15.9 g/dL   HCT 28.1 (L) 34.8 - 46.6 %   Platelets 392 145 - 400 10e3/uL   MCV 111.1 (H) 79.5 - 101.0 fL   MCH 38.7 (H) 25.1 - 34.0 pg   MCHC 34.9 31.5 - 36.0 g/dL   RBC 2.53 (L) 3.70 - 5.45 10e6/uL   RDW 15.2 (H) 11.2 - 14.5 %   lymph# 5.8 (H) 0.9 - 3.3 10e3/uL   MONO# 1.3 (H) 0.1 - 0.9 10e3/uL   Eosinophils Absolute 0.1 0.0 - 0.5 10e3/uL   Basophils Absolute 0.1 0.0 - 0.1 10e3/uL   NEUT% 40.6 38.4 - 76.8 %   LYMPH% 47.7 14.0 - 49.7 %   MONO% 10.5 0.0 - 14.0 %   EOS% 0.6 0.0 - 7.0 %   BASO% 0.6 0.0 - 2.0 %   nRBC 0 0 - 0 %  Comprehensive metabolic panel (Cmet) - [facility]  Result Value Ref Range   Sodium 141 136 - 145 mEq/L   Potassium 3.9 3.5 - 5.1 mEq/L   Chloride 102 98 - 109 mEq/L   CO2 30 (H) 22 - 29 mEq/L   Glucose 88 70 - 140 mg/dl   BUN 10.5 7.0 - 26.0 mg/dL   Creatinine 0.6 0.6 - 1.1 mg/dL   Total Bilirubin 2.10 (H) 0.20 - 1.20 mg/dL   Alkaline Phosphatase 143 40 -  150 U/L   AST 20 5 - 34 U/L   ALT 15 0 - 55 U/L   Total Protein 6.3 (L) 6.4 - 8.3 g/dL   Albumin 3.9 3.5 - 5.0 g/dL   Calcium 8.8 8.4 - 10.4 mg/dL   Anion Gap 9 3 - 11 mEq/L   EGFR 89 (L) >90 ml/min/1.73 m2    IFE 01-2014 by Dr Juliann Mule had monoclonal protein 0.62 g/dl.   Studies/Results:  Last mammograms at [facility] 08-04-13 extremely dense breast tissue. Will schedule mammograms now, recommended tomo/ 3D due to density of tissue  Medications: I have reviewed the patient's current medications. Continue folate  DISCUSSION: I have encouraged her to establish with PCP. She will call offices to see which are taking new medicare patients. Dr Janeice Robinson cares for family members.  Assessment/Plan:  1.Cold agglutinin hemolytic anemia: stable. Continue cold avoidance and supplemental folate.  2.slightly low B12 with macrocytic  indices: continue B12 supplement now oral.  3.long tobacco abuse: she is finally addressing this. Discussed. 4.hx diverticulitis, not symptomatic 5.dense breast tissue on most recent mammograms: 3D/ tomo mammography would be appropriate for next imaging 6.low monoclonal spike on IFE, follow 7.flu vaccine done 8.needs PCP  Return visit with labs 6 mo. Patient understood discussion, recommendations and plans and was in agreement.    LIVESAY,LENNIS P, MD   08/29/2014, 3:33 PM

## 2014-09-21 ENCOUNTER — Ambulatory Visit
Admission: RE | Admit: 2014-09-21 | Discharge: 2014-09-21 | Disposition: A | Discharge status: Home / Self Care | Payer: Medicare Other | Source: Ambulatory Visit

## 2014-09-21 DIAGNOSIS — Z1231 Encounter for screening mammogram for malignant neoplasm of breast: Secondary | ICD-10-CM

## 2014-09-28 ENCOUNTER — Other Ambulatory Visit: Payer: Self-pay | Admitting: Dermatology

## 2014-11-10 ENCOUNTER — Telehealth: Payer: Self-pay | Admitting: Oncology

## 2014-11-10 NOTE — Telephone Encounter (Signed)
, °

## 2014-11-15 ENCOUNTER — Other Ambulatory Visit: Payer: Self-pay

## 2014-11-15 DIAGNOSIS — D591 Autoimmune hemolytic anemia, unspecified: Secondary | ICD-10-CM

## 2014-11-15 MED ORDER — CYANOCOBALAMIN ER 1000 MCG PO TBCR: EXTENDED_RELEASE_TABLET | ORAL | Status: DC

## 2014-11-15 MED ORDER — FOLIC ACID 1 MG PO TABS
ORAL_TABLET | ORAL | Status: DC
Start: 2014-11-15 — End: 2015-05-19

## 2014-11-15 NOTE — Telephone Encounter (Signed)
Left a message on patient's answering machine on her home number that the refills were sent to [facility] as requested.

## 2015-02-21 ENCOUNTER — Telehealth: Payer: Self-pay | Admitting: Oncology

## 2015-02-21 NOTE — Telephone Encounter (Signed)
Returned Advertising account executive. Left message to confirm appointment for June.

## 2015-02-27 ENCOUNTER — Ambulatory Visit: Payer: Self-pay | Admitting: Oncology

## 2015-02-27 ENCOUNTER — Encounter: Payer: Self-pay | Admitting: Oncology

## 2015-02-27 ENCOUNTER — Telehealth: Payer: Self-pay | Admitting: Oncology

## 2015-02-27 ENCOUNTER — Ambulatory Visit (HOSPITAL_BASED_OUTPATIENT_CLINIC_OR_DEPARTMENT_OTHER): Payer: Medicare Other | Admitting: Oncology

## 2015-02-27 ENCOUNTER — Other Ambulatory Visit (HOSPITAL_BASED_OUTPATIENT_CLINIC_OR_DEPARTMENT_OTHER): Payer: Medicare Other

## 2015-02-27 VITALS — BP 117/52 | HR 66 | Temp 98.2°F | Resp 16 | Ht 60.0 in | Wt 86.2 lb

## 2015-02-27 DIAGNOSIS — Z720 Tobacco use: Secondary | ICD-10-CM

## 2015-02-27 DIAGNOSIS — D591 Other autoimmune hemolytic anemias: Secondary | ICD-10-CM | POA: Diagnosis not present

## 2015-02-27 DIAGNOSIS — D5912 Cold autoimmune hemolytic anemia: Secondary | ICD-10-CM

## 2015-02-27 DIAGNOSIS — E538 Deficiency of other specified B group vitamins: Secondary | ICD-10-CM | POA: Diagnosis not present

## 2015-02-27 DIAGNOSIS — R922 Inconclusive mammogram: Secondary | ICD-10-CM

## 2015-02-27 LAB — CBC WITH DIFFERENTIAL/PLATELET
BASO%: 0.3 % (ref 0.0–2.0)
Basophils Absolute: 0 10*3/uL (ref 0.0–0.1)
EOS ABS: 0.1 10*3/uL (ref 0.0–0.5)
EOS%: 0.7 % (ref 0.0–7.0)
HCT: 25.5 % — ABNORMAL LOW (ref 34.8–46.6)
HGB: 9.1 g/dL — ABNORMAL LOW (ref 11.6–15.9)
LYMPH#: 5.1 10*3/uL — AB (ref 0.9–3.3)
LYMPH%: 38.3 % (ref 14.0–49.7)
MCH: 40.4 pg — ABNORMAL HIGH (ref 25.1–34.0)
MCHC: 35.7 g/dL (ref 31.5–36.0)
MCV: 113.3 fL — ABNORMAL HIGH (ref 79.5–101.0)
MONO#: 1 10*3/uL — ABNORMAL HIGH (ref 0.1–0.9)
MONO%: 7.4 % (ref 0.0–14.0)
NEUT%: 53.3 % (ref 38.4–76.8)
NEUTROS ABS: 7.1 10*3/uL — AB (ref 1.5–6.5)
PLATELETS: 447 10*3/uL — AB (ref 145–400)
RBC: 2.25 10*6/uL — AB (ref 3.70–5.45)
RDW: 16.3 % — ABNORMAL HIGH (ref 11.2–14.5)
WBC: 13.2 10*3/uL — ABNORMAL HIGH (ref 3.9–10.3)
nRBC: 0 % (ref 0–0)

## 2015-02-27 LAB — TECHNOLOGIST REVIEW

## 2015-02-27 NOTE — Telephone Encounter (Signed)
Gave and pirnted appt sched and avs fo rpt for DEC

## 2015-02-27 NOTE — Progress Notes (Signed)
OFFICE PROGRESS NOTE   02/27/2015   Physicians: M.Magod. No PCP per patient.  INTERVAL HISTORY:  Patient is seen for a six-month follow-up for cold agglutinin disease. She remains on chronic folic acid supplement due to hemolysis from the cold agglutinin. She had slightly low B12 in past, presently on B12 oral 1000 mg daily.  She has had no subjective changes in health in last several months. She tries to stay warm, denies increased fatigue or increased SOB with exertion. She has decreased cigarettes down to 1-2 cigarettes per week for last 4+ months, is using nicotine patch. She denies new or different pain. Appetite and energy are at baseline.  She has had no bleeding  She does not have PAC She had flu vaccine  Review of systems as above, also: No recent fever or symptoms of infection. Denies increased cough or SOB. Denies new or different pain. No bleeding.  Remainder of 10 point Review of Systems negative.  Objective:  Vital signs in last 24 hours:  BP 117/52 mmHg  Pulse 66  Temp(Src) 98.2 F (36.8 C) (Oral)  Resp 16  Ht 5' (1.524 m)  Wt 86 lb 3.2 oz (39.1 kg)  BMI 16.83 kg/m2  SpO2 100% weight is stable  Alert, oriented and appropriate. Ambulatory without difficulty.   HEENT:PERRL, sclerae not icteric. Oral mucosa moist without lesions, posterior pharynx clear.  Neck supple. No JVD.  Lymphatics:no cervical,suraclavicular, axillary or inguinal adenopathy Resp: diminished BS thruout without wheeze or crackles. No dullness to percussion Cardio: regular rate and rhythm. No gallop. GI: abbdomen scaphoidm soft, nontender, not distended, no mass or organomegaly. Normally active bowel sounds.  Musculoskeletal/ Extremities: without pitting edema, cords, tenderness Neuro:  nonfocal PSYCH apprepriate mood and affect Skin without rash, ecchymosis, petechiae   Lab Results:  Results for orders placed or performed in visit on 02/27/15  TECHNOLOGIST REVIEW  Result Value Ref  Range   Technologist Review Variant lymphs present, Cold Agglutinin on Smear   CBC with Differential  Result Value Ref Range   WBC 13.2 (H) 3.9 - 10.3 10e3/uL   NEUT# 7.1 (H) 1.5 - 6.5 10e3/uL   HGB 9.1 (L) 11.6 - 15.9 g/dL   HCT 25.5 (L) 34.8 - 46.6 %   Platelets 447 (H) 145 - 400 10e3/uL   MCV 113.3 (H) 79.5 - 101.0 fL   MCH 40.4 (H) 25.1 - 34.0 pg   MCHC 35.7 31.5 - 36.0 g/dL   RBC 2.25 (L) 3.70 - 5.45 10e6/uL   RDW 16.3 (H) 11.2 - 14.5 %   lymph# 5.1 (H) 0.9 - 3.3 10e3/uL   MONO# 1.0 (H) 0.1 - 0.9 10e3/uL   Eosinophils Absolute 0.1 0.0 - 0.5 10e3/uL   Basophils Absolute 0.0 0.0 - 0.1 10e3/uL   NEUT% 53.3 38.4 - 76.8 %   LYMPH% 38.3 14.0 - 49.7 %   MONO% 7.4 0.0 - 14.0 %   EOS% 0.7 0.0 - 7.0 %   BASO% 0.3 0.0 - 2.0 %   nRBC 0 0 - 0 %    IFE 01-2014 by Dr Juliann Mule had monoclonal protein 0.62 g/dl.   Studies/Results:  Last mammograms at [facility] 08-04-13 extremely dense breast tissue. Will schedule mammograms now, recommended tomo/ 3D due to density of tissue  Medications: I have reviewed the patient's current medications. Continue folate  DISCUSSION: I have encouraged her to establish with PCP. She will call offices to see which are taking new medicare patients.   Assessment/Plan:  1.Cold agglutinin hemolytic anemia:  stable. Continue cold avoidance and supplemental folate.  2.slightly low B12 with macrocytic indices: continue B12 supplement now oral.  3.long tobacco abuse: she is finally addressing this. Discussed. 4.hx diverticulitis, not symptomatic 5.dense breast tissue on most recent mammograms: 3D/ tomo mammography would be appropriate for next imaging 6.low monoclonal spike on IFE, follow 7.flu vaccine done 8.needs PCP  Return visit with labs 6 mo. Patient understood discussion, recommendations and plans and was in agreement.    Mikey Bussing, NP   02/27/2015, 4:38 PM

## 2015-03-30 ENCOUNTER — Other Ambulatory Visit: Payer: Self-pay

## 2015-03-30 DIAGNOSIS — D591 Autoimmune hemolytic anemia, unspecified: Secondary | ICD-10-CM

## 2015-03-30 MED ORDER — CYANOCOBALAMIN ER 1000 MCG PO TBCR: EXTENDED_RELEASE_TABLET | ORAL | Status: DC

## 2015-05-19 ENCOUNTER — Other Ambulatory Visit: Payer: Self-pay

## 2015-05-19 DIAGNOSIS — D591 Autoimmune hemolytic anemia, unspecified: Secondary | ICD-10-CM

## 2015-05-19 MED ORDER — FOLIC ACID 1 MG PO TABS: ORAL_TABLET | ORAL | Status: DC

## 2015-08-03 ENCOUNTER — Emergency Department (HOSPITAL_COMMUNITY): Payer: Medicare Other

## 2015-08-03 ENCOUNTER — Encounter (HOSPITAL_COMMUNITY): Payer: Self-pay | Admitting: Emergency Medicine

## 2015-08-03 ENCOUNTER — Inpatient Hospital Stay (HOSPITAL_COMMUNITY)
Admission: EM | Admit: 2015-08-03 | Discharge: 2015-08-08 | DRG: 480 | Disposition: A | Discharge status: Skilled Nursing Facility | Payer: Medicare Other | Attending: Internal Medicine | Admitting: Internal Medicine

## 2015-08-03 DIAGNOSIS — S72009A Fracture of unspecified part of neck of unspecified femur, initial encounter for closed fracture: Secondary | ICD-10-CM

## 2015-08-03 DIAGNOSIS — W19XXXA Unspecified fall, initial encounter: Secondary | ICD-10-CM | POA: Diagnosis present

## 2015-08-03 DIAGNOSIS — T4145XA Adverse effect of unspecified anesthetic, initial encounter: Secondary | ICD-10-CM | POA: Diagnosis not present

## 2015-08-03 DIAGNOSIS — E43 Unspecified severe protein-calorie malnutrition: Secondary | ICD-10-CM | POA: Diagnosis present

## 2015-08-03 DIAGNOSIS — D591 Other autoimmune hemolytic anemias: Secondary | ICD-10-CM | POA: Diagnosis present

## 2015-08-03 DIAGNOSIS — S72001A Fracture of unspecified part of neck of right femur, initial encounter for closed fracture: Secondary | ICD-10-CM | POA: Diagnosis present

## 2015-08-03 DIAGNOSIS — Z681 Body mass index (BMI) 19 or less, adult: Secondary | ICD-10-CM

## 2015-08-03 DIAGNOSIS — N390 Urinary tract infection, site not specified: Secondary | ICD-10-CM | POA: Diagnosis not present

## 2015-08-03 DIAGNOSIS — D62 Acute posthemorrhagic anemia: Secondary | ICD-10-CM | POA: Diagnosis not present

## 2015-08-03 DIAGNOSIS — G8929 Other chronic pain: Secondary | ICD-10-CM | POA: Diagnosis present

## 2015-08-03 DIAGNOSIS — S72142A Displaced intertrochanteric fracture of left femur, initial encounter for closed fracture: Secondary | ICD-10-CM | POA: Diagnosis present

## 2015-08-03 DIAGNOSIS — R410 Disorientation, unspecified: Secondary | ICD-10-CM | POA: Diagnosis not present

## 2015-08-03 DIAGNOSIS — E876 Hypokalemia: Secondary | ICD-10-CM | POA: Diagnosis present

## 2015-08-03 DIAGNOSIS — Y92009 Unspecified place in unspecified non-institutional (private) residence as the place of occurrence of the external cause: Secondary | ICD-10-CM | POA: Diagnosis present

## 2015-08-03 DIAGNOSIS — M25552 Pain in left hip: Secondary | ICD-10-CM | POA: Diagnosis present

## 2015-08-03 DIAGNOSIS — E538 Deficiency of other specified B group vitamins: Secondary | ICD-10-CM | POA: Diagnosis present

## 2015-08-03 DIAGNOSIS — F17210 Nicotine dependence, cigarettes, uncomplicated: Secondary | ICD-10-CM | POA: Diagnosis present

## 2015-08-03 DIAGNOSIS — W101XXA Fall (on)(from) sidewalk curb, initial encounter: Secondary | ICD-10-CM | POA: Diagnosis present

## 2015-08-03 DIAGNOSIS — S72002A Fracture of unspecified part of neck of left femur, initial encounter for closed fracture: Secondary | ICD-10-CM

## 2015-08-03 DIAGNOSIS — M542 Cervicalgia: Secondary | ICD-10-CM | POA: Diagnosis present

## 2015-08-03 DIAGNOSIS — Z01818 Encounter for other preprocedural examination: Secondary | ICD-10-CM

## 2015-08-03 DIAGNOSIS — D5912 Cold autoimmune hemolytic anemia: Secondary | ICD-10-CM | POA: Diagnosis present

## 2015-08-03 DIAGNOSIS — Z720 Tobacco use: Secondary | ICD-10-CM

## 2015-08-03 LAB — CBC
HEMATOCRIT: 29.9 % — AB (ref 36.0–46.0)
Hemoglobin: 10.5 g/dL — ABNORMAL LOW (ref 12.0–15.0)
MCH: 39 pg — ABNORMAL HIGH (ref 26.0–34.0)
MCHC: 35.1 g/dL (ref 30.0–36.0)
MCV: 111.2 fL — AB (ref 78.0–100.0)
PLATELETS: 443 10*3/uL — AB (ref 150–400)
RBC: 2.69 MIL/uL — ABNORMAL LOW (ref 3.87–5.11)
RDW: 15.6 % — AB (ref 11.5–15.5)
WBC: 11.9 10*3/uL — AB (ref 4.0–10.5)

## 2015-08-03 LAB — BASIC METABOLIC PANEL
Anion gap: 8 (ref 5–15)
BUN: 9 mg/dL (ref 6–20)
CALCIUM: 9 mg/dL (ref 8.9–10.3)
CO2: 30 mmol/L (ref 22–32)
CREATININE: 0.52 mg/dL (ref 0.44–1.00)
Chloride: 103 mmol/L (ref 101–111)
GFR calc Af Amer: >60 mL/min (ref >60)
GLUCOSE: 95 mg/dL (ref 65–99)
Potassium: 3.8 mmol/L (ref 3.5–5.1)
Sodium: 141 mmol/L (ref 135–145)

## 2015-08-03 LAB — PROTIME-INR
INR: 1.06 (ref 0.00–1.49)
Prothrombin Time: 14 seconds (ref 11.6–15.2)

## 2015-08-03 MED ORDER — FOLIC ACID 1 MG PO TABS
1.0000 mg | ORAL_TABLET | Freq: Every day | ORAL | Status: DC
  Administered 2015-08-04 – 2015-08-08 (×5): 1 mg via ORAL
  Filled 2015-08-03 (×5): qty 1

## 2015-08-03 MED ORDER — METHOCARBAMOL 1000 MG/10ML IJ SOLN
500.0000 mg | Freq: Four times a day (QID) | INTRAVENOUS | Status: DC | PRN
  Filled 2015-08-03: qty 5

## 2015-08-03 MED ORDER — DOCUSATE SODIUM 100 MG PO CAPS: 100.0000 mg | ORAL_CAPSULE | Freq: Two times a day (BID) | ORAL | Status: DC

## 2015-08-03 MED ORDER — NICOTINE 14 MG/24HR TD PT24
14.0000 mg | MEDICATED_PATCH | Freq: Every day | TRANSDERMAL | Status: DC
  Administered 2015-08-04 – 2015-08-07 (×5): 14 mg via TRANSDERMAL
  Filled 2015-08-03 (×5): qty 1

## 2015-08-03 MED ORDER — MORPHINE SULFATE (PF) 2 MG/ML IV SOLN
2.0000 mg | INTRAVENOUS | Status: DC | PRN
  Administered 2015-08-03 – 2015-08-04 (×2): 2 mg via INTRAVENOUS
  Filled 2015-08-03 (×4): qty 1

## 2015-08-03 MED ORDER — POTASSIUM CHLORIDE CRYS ER 20 MEQ PO TBCR
40.0000 meq | EXTENDED_RELEASE_TABLET | Freq: Once | ORAL | Status: AC
  Administered 2015-08-03: 40 meq via ORAL
  Filled 2015-08-03: qty 2

## 2015-08-03 MED ORDER — DOCUSATE SODIUM 50 MG PO CAPS
50.0000 mg | ORAL_CAPSULE | Freq: Every day | ORAL | Status: DC
  Administered 2015-08-04 – 2015-08-05 (×3): 50 mg via ORAL
  Filled 2015-08-03 (×4): qty 1

## 2015-08-03 MED ORDER — SENNOSIDES-DOCUSATE SODIUM 8.6-50 MG PO TABS: 1.0000 | ORAL_TABLET | Freq: Every evening | ORAL | Status: DC | PRN

## 2015-08-03 MED ORDER — FERROUS SULFATE 325 (65 FE) MG PO TABS
325.0000 mg | ORAL_TABLET | Freq: Three times a day (TID) | ORAL | Status: DC
  Administered 2015-08-04 – 2015-08-08 (×13): 325 mg via ORAL
  Filled 2015-08-03 (×15): qty 1

## 2015-08-03 MED ORDER — METHOCARBAMOL 500 MG PO TABS
500.0000 mg | ORAL_TABLET | Freq: Four times a day (QID) | ORAL | Status: DC | PRN
  Administered 2015-08-04: 500 mg via ORAL
  Filled 2015-08-03: qty 1

## 2015-08-03 MED ORDER — POTASSIUM CHLORIDE 10 MEQ/100ML IV SOLN: 10.0000 meq | INTRAVENOUS | Status: DC

## 2015-08-03 MED ORDER — IPRATROPIUM-ALBUTEROL 0.5-2.5 (3) MG/3ML IN SOLN
3.0000 mL | Freq: Four times a day (QID) | RESPIRATORY_TRACT | Status: DC
  Filled 2015-08-03: qty 3

## 2015-08-03 MED ORDER — HYDROCODONE-ACETAMINOPHEN 5-325 MG PO TABS
1.0000 | ORAL_TABLET | Freq: Four times a day (QID) | ORAL | Status: DC | PRN
  Administered 2015-08-04 (×2): 1 via ORAL
  Filled 2015-08-03: qty 1
  Filled 2015-08-03: qty 2

## 2015-08-03 MED ORDER — ASPIRIN EC 325 MG PO TBEC
325.0000 mg | DELAYED_RELEASE_TABLET | Freq: Every day | ORAL | Status: DC
  Administered 2015-08-04 – 2015-08-08 (×5): 325 mg via ORAL
  Filled 2015-08-03 (×6): qty 1

## 2015-08-03 MED ORDER — ONDANSETRON HCL 4 MG/2ML IJ SOLN
4.0000 mg | Freq: Four times a day (QID) | INTRAMUSCULAR | Status: DC | PRN
  Administered 2015-08-03: 4 mg via INTRAVENOUS
  Filled 2015-08-03: qty 2

## 2015-08-03 MED ORDER — VITAMIN B-12 1000 MCG PO TABS
1000.0000 ug | ORAL_TABLET | Freq: Every day | ORAL | Status: DC
  Administered 2015-08-04 – 2015-08-08 (×6): 1000 ug via ORAL
  Filled 2015-08-03 (×7): qty 1

## 2015-08-03 NOTE — ED Notes (Signed)
Patient transported to X-ray 

## 2015-08-03 NOTE — ED Provider Notes (Signed)
CSN: [id number]     Arrival date & time 08/03/15  1537 History   First MD Initiated Contact with Patient 08/03/15 1542     Chief Complaint  Patient presents with  . Fall  . Hip Pain     (Consider location/radiation/quality/duration/timing/severity/associated sxs/prior Treatment) HPI Comments: Pt comes in cc of fall. Pt had a mechanical fall prior to ER visit. She has no medical hx. Pt didn't striker her head and she denies nausea, vomiting, visual complains, seizures, altered mental status, loss of consciousness, new weakness, or numbness, no gait instability. Not on anticoagulants or aspirin.   Patient is a 74 y.o. female presenting with fall and hip pain. The history is provided by the patient.  Fall Pertinent negatives include no chest pain, no abdominal pain, no headaches and no shortness of breath.  Hip Pain Pertinent negatives include no chest pain, no abdominal pain, no headaches and no shortness of breath.    History reviewed. No pertinent past medical history. Past Surgical History  Procedure Laterality Date  . Tonsillectomy    . Appendectomy    . Abdominal hysterectomy    . Exploratory laparotomy     History reviewed. No pertinent family history. Social History  Substance Use Topics  . Smoking status: Current Every Day Smoker -- 1.00 packs/day    Types: Cigarettes  . Smokeless tobacco: None  . Alcohol Use: Yes     Comment: occ   OB History    No data available     Review of Systems  Respiratory: Negative for shortness of breath.   Cardiovascular: Negative for chest pain.  Gastrointestinal: Negative for nausea, vomiting and abdominal pain.  Genitourinary: Negative for dysuria.  Musculoskeletal: Positive for arthralgias. Negative for back pain and neck pain.  Neurological: Negative for headaches.      Allergies  Review of patient's allergies indicates no known allergies.  Home Medications   Prior to Admission medications   Medication Sig Start  Date End Date Taking? Authorizing Provider  Cyanocobalamin ([facility] B-12) 1000 MCG TBCR TAKE 1 TABLET DAILY 03/30/15  Yes Lennis Marion Downer, MD  docusate sodium (COLACE) 50 MG capsule Take 50 mg by mouth daily.   Yes Historical Provider, MD  folic acid (FOLVITE) 1 MG tablet TAKE 1 TABLET BY MOUTH DAILY 05/19/15  Yes Lennis P Livesay, MD   BP 121/44 mmHg  Pulse 81  Temp(Src) 97.4 F (36.3 C) (Oral)  Resp 15  Ht 5' (1.524 m)  Wt 86 lb (39.009 kg)  BMI 16.80 kg/m2  SpO2 94% Physical Exam  Constitutional: She is oriented to person, place, and time. She appears well-developed.  HENT:  Head: Normocephalic and atraumatic.  Eyes: EOM are normal.  Neck: Normal range of motion. Neck supple.  Cardiovascular: Normal rate and intact distal pulses.   Pulmonary/Chest: Effort normal.  Abdominal: Bowel sounds are normal.  Musculoskeletal:  L sided hip tenderness, proximal femur tenderness  Neurological: She is alert and oriented to person, place, and time.  Skin: Skin is warm and dry.  Nursing note and vitals reviewed.   ED Course  Procedures (including critical care time) Labs Review Labs Reviewed  CBC - Abnormal; Notable for the following:    WBC 11.9 (*)    RBC 2.69 (*)    Hemoglobin 10.5 (*)    HCT 29.9 (*)    MCV 111.2 (*)    MCH 39.0 (*)    RDW 15.6 (*)    Platelets 443 (*)    All other  components within normal limits  BASIC METABOLIC PANEL  PROTIME-INR  VITAMIN D 25 HYDROXY  TYPE AND SCREEN    Imaging Review Dg Chest Port 1 View  08/03/2015  CLINICAL DATA:  Preop, left hip fracture. EXAM: PORTABLE CHEST 1 VIEW COMPARISON:  09/30/2007 FINDINGS: Hyperinflation and emphysema, unchanged from prior exam. There may be minimal vascular congestion but no pulmonary edema. The cardiomediastinal contours are normal. No consolidation, pleural effusion, or pneumothorax. No acute osseous abnormalities are seen. IMPRESSION: 1. Minimal vascular congestion, no overt edema or congestive failure. 2.  Hyperinflation and emphysema. Electronically Signed   By: Jeb Levering M.D.   On: 08/03/2015 18:09   Dg Hip Unilat With Pelvis 2-3 Views Left  08/03/2015  CLINICAL DATA:  Fall all with left hip pain EXAM: DG HIP (WITH OR WITHOUT PELVIS) 2-3V LEFT COMPARISON:  03/02/2009 CT pelvis. FINDINGS: There is a comminuted nondisplaced intratrochanteric left proximal femur fracture. No left hip dislocation. No additional fracture. No suspicious focal osseous lesion. IMPRESSION: Comminuted nondisplaced intertrochanteric left proximal femur fracture. Electronically Signed   By: Ilona Sorrel M.D.   On: 08/03/2015 17:23   I have personally reviewed and evaluated these images and lab results as part of my medical decision-making.   EKG Interpretation   Date/Time:  Thursday August 03 2015 18:35:42 EST Ventricular Rate:  75 PR Interval:  154 QRS Duration: 97 QT Interval:  394 QTC Calculation: 440 R Axis:   86 Text Interpretation:  Sinus rhythm Borderline right axis deviation  Borderline repolarization abnormality No acute changes Confirmed by  Kathrynn Humble, MD, Thelma Comp [phone]) on 08/03/2015 8:06:54 PM      MDM   Final diagnoses:  Hip fracture, left, closed, initial encounter ([place])    DDx includes: - Mechanical falls - ICH - Fractures - Contusions - Soft tissue injury   Pt had a mechanical fall. She has no head trauma, not on any anticoagulants and has no red flags to suggest intracranial bleed - so brain was cleared clinically.  Pt does have a prox femur fracture on the L side.  Spoke with Dr. Percell Miller, Surgery - he requests admission to [facility] and NPO after midnight. Dr. Posey Pronto from medicine to admit the patient.   Varney Biles, MD 08/03/15 2007

## 2015-08-03 NOTE — ED Notes (Signed)
MD at bedside. 

## 2015-08-03 NOTE — ED Notes (Signed)
Attempted x2 for collection of type and screen unsuccessful. Will notify RN receiving patient.

## 2015-08-03 NOTE — ED Notes (Signed)
Bed Placement contacted about Bed.

## 2015-08-03 NOTE — ED Notes (Signed)
Per EMS. Pt from home. Pt tripped while walking up sidewalk. Pt complaining of L hip pain. No obvious deformity, bruising or rotation noted. Pt has pain on lifting and is unable to fully rotate her leg.

## 2015-08-03 NOTE — Consult Note (Signed)
ORTHOPAEDIC CONSULTATION  REQUESTING PHYSICIAN: [name], MD  Chief Complaint: L hip pain  HPI: [name] is a 74 y.o. female who complains of L hip pain after a mechanical fall on 11/10.  The patient reports to have been stepping off a curb when she tripped and fell onto her left side.  She had pain with weight bearing after that.  Patient denies hitting her head or LOC.  The patient was transported to the ED, where she was found to have a L hip fracture.  Ambulatory without any assistive devices at baseline.   History reviewed. No pertinent past medical history. Past Surgical History  Procedure Laterality Date  . Tonsillectomy    . Appendectomy    . Abdominal hysterectomy    . Exploratory laparotomy     Social History   Social History  . Marital Status: Divorced    Spouse Name: N/A  . Number of Children: N/A  . Years of Education: N/A   Social History Main Topics  . Smoking status: Current Every Day Smoker -- 1.00 packs/day    Types: Cigarettes  . Smokeless tobacco: None  . Alcohol Use: Yes     Comment: occ  . Drug Use: None  . Sexual Activity: Not Asked   Other Topics Concern  . None   Social History Narrative   History reviewed. No pertinent family history. No Known Allergies Prior to Admission medications   Medication Sig Start Date End Date Taking? Authorizing Provider  Cyanocobalamin ([employer] B-12) 1000 MCG TBCR TAKE 1 TABLET DAILY 03/30/15  Yes Lennis Marion Downer, MD  docusate sodium (COLACE) 50 MG capsule Take 50 mg by mouth daily.   Yes Historical Provider, MD  folic acid (FOLVITE) 1 MG tablet TAKE 1 TABLET BY MOUTH DAILY 05/19/15  Yes Gordy Levan, MD   Dg Chest Port 1 View  08/03/2015  CLINICAL DATA:  Preop, left hip fracture. EXAM: PORTABLE CHEST 1 VIEW COMPARISON:  09/30/2007 FINDINGS: Hyperinflation and emphysema, unchanged from prior exam. There may be minimal vascular congestion but no pulmonary edema. The cardiomediastinal  contours are normal. No consolidation, pleural effusion, or pneumothorax. No acute osseous abnormalities are seen. IMPRESSION: 1. Minimal vascular congestion, no overt edema or congestive failure. 2. Hyperinflation and emphysema. Electronically Signed   By: Jeb Levering M.D.   On: 08/03/2015 18:09   Dg Hip Unilat With Pelvis 2-3 Views Left  08/03/2015  CLINICAL DATA:  Fall all with left hip pain EXAM: DG HIP (WITH OR WITHOUT PELVIS) 2-3V LEFT COMPARISON:  03/02/2009 CT pelvis. FINDINGS: There is a comminuted nondisplaced intratrochanteric left proximal femur fracture. No left hip dislocation. No additional fracture. No suspicious focal osseous lesion. IMPRESSION: Comminuted nondisplaced intertrochanteric left proximal femur fracture. Electronically Signed   By: Ilona Sorrel M.D.   On: 08/03/2015 17:23    Positive ROS: All other systems have been reviewed and were otherwise negative with the exception of those mentioned in the HPI and as above.  Labs cbc  Recent Labs  08/03/15 1645  WBC 11.9*  HGB 10.5*  HCT 29.9*  PLT 443*    Labs inflam No results for input(s): CRP in the last 72 hours.  Invalid input(s): ESR  Labs coag  Recent Labs  08/03/15 1937  INR 1.06     Recent Labs  08/03/15 1645  NA 141  K 3.8  CL 103  CO2 30  GLUCOSE 95  BUN 9  CREATININE 0.52  CALCIUM 9.0  Physical Exam: Filed Vitals:   08/03/15 1938  BP: 121/44  Pulse: 81  Temp:   Resp: 15   General: Alert, no acute distress Cardiovascular: No pedal edema Respiratory: No cyanosis, no use of accessory musculature GI: No organomegaly, abdomen is soft and non-tender Skin: No lesions in the area of chief complaint other than those listed below in MSK exam.  Neurologic: Sensation intact distally Psychiatric: Patient is competent for consent with normal mood and affect Lymphatic: No axillary or cervical lymphadenopathy  MUSCULOSKELETAL:  L hip has so erythema.  Mild swelling and  ecchymosis.  Leg is not shortened or mal-rotated. Tender to palpation.  Pain with log roll of L leg.  Sensation intact with 2+ distal pulses.  Other extremities are atraumatic with painless ROM and NVI.  Assessment: Comminuted non-displaced intertrochanteric hip fracture after a mechanical fall on 11/10  Plan: Patient was found to have L hip fracture on imaging.  Both operative and non-op management discusses.  Recommending surgical intervention to help improve mobility and decrease pain.  Risk of surgery discussed.  Patient understands and wishes to proceed with surgical correction.  Patient will remain NPO after midnight and on bedrest.   Gae Dry, PA-C Cell [phone]   08/03/2015 8:38 PM

## 2015-08-03 NOTE — H&P (Signed)
[facility] History and Physical  Patient: [name]  MRN: [id number]  DOB: Jul 22, 1941  DOS: the patient was seen and examined on 08/03/2015 PCP: Horatio Pel, MD  Referring physician: dr Kathrynn Humble Chief Complaint: fall  HPI: [name] is a 74 y.o. female with Past medical history of cold agglutinin antibody. Patient presents with a mechanical fall. The patient had parked her car in the parking and was trying to walk on the curb. She hit the raised area of the curb and lost her balance and fell on the left side. She denies any head injury or neck injury. She denies any chest pain abdominal pain nausea or vomiting fever or chills. Denies any recent change in her medication. Next and denies any recurrent fall trauma or injury.  Does not have Coronary revascularization/CVA within 5 years. No recent stress test. Can Climb flight of stair, participates in recreational activity, household chores without getting out of breath. No Prior adverse event with anesthesia. No Alcohol use, drug use. Patient is an active smoker.  The patient is coming from home.  At her baseline ambulates without support And is independent for most of her ADL; manages her medication on her own.  Review of Systems: as mentioned in the history of present illness.  A comprehensive review of the other systems is negative.  History reviewed. No pertinent past medical history. Past Surgical History  Procedure Laterality Date  . Tonsillectomy    . Appendectomy    . Abdominal hysterectomy    . Exploratory laparotomy     Social History:  reports that she has been smoking Cigarettes.  She has been smoking about 1.00 pack per day. She does not have any smokeless tobacco history on file. She reports that she drinks alcohol. Her drug history is not on file.  No Known Allergies  History reviewed. No pertinent family history.  Prior to Admission medications   Medication Sig Start  Date End Date Taking? Authorizing Provider  Cyanocobalamin ([employer] B-12) 1000 MCG TBCR TAKE 1 TABLET DAILY 03/30/15  Yes Lennis Marion Downer, MD  docusate sodium (COLACE) 50 MG capsule Take 50 mg by mouth daily.   Yes Historical Provider, MD  folic acid (FOLVITE) 1 MG tablet TAKE 1 TABLET BY MOUTH DAILY 05/19/15  Yes Gordy Levan, MD    Physical Exam: Filed Vitals:   08/03/15 1546 08/03/15 1554 08/03/15 1803 08/03/15 1938  BP:  141/54 126/61 121/44  Pulse:  63 79 81  Temp:  98.3 F (36.8 C) 97.4 F (36.3 C)   TempSrc:  Oral    Resp:  18 18 15   Height:  5' (1.524 m)    Weight:  39.009 kg (86 lb)    SpO2: 97% 99% 99% 94%    General: Alert, Awake and Oriented to Time, Place and Person. Appear in mild distress Eyes: PERRL ENT: Oral Mucosa clear moist. Neck: no JVD Cardiovascular: S1 and S2 Present, no Murmur, Peripheral Pulses Present Respiratory: Bilateral Air entry equal and Decreased,  Clear to Auscultation, no Crackles, no wheezes Abdomen: Bowel Sound present, Soft and no tenderness Skin: no Rash Extremities: no Pedal edema, no calf tenderness Neurologic: Grossly no focal neuro deficit.  Labs on Admission:  CBC:  Recent Labs Lab 08/03/15 1645  WBC 11.9*  HGB 10.5*  HCT 29.9*  MCV 111.2*  PLT 443*    CMP     Component Value Date/Time   NA 141 08/03/2015 1645   NA 141 08/29/2014 1441  K 3.8 08/03/2015 1645   K 3.9 08/29/2014 1441   CL 103 08/03/2015 1645   CL 101 08/07/2012 1127   CO2 30 08/03/2015 1645   CO2 30* 08/29/2014 1441   GLUCOSE 95 08/03/2015 1645   GLUCOSE 88 08/29/2014 1441   GLUCOSE 98 08/07/2012 1127   BUN 9 08/03/2015 1645   BUN 10.5 08/29/2014 1441   CREATININE 0.52 08/03/2015 1645   CREATININE 0.6 08/29/2014 1441   CALCIUM 9.0 08/03/2015 1645   CALCIUM 8.8 08/29/2014 1441   PROT 6.3* 08/29/2014 1441   PROT 6.3 11/09/2010 1042   ALBUMIN 3.9 08/29/2014 1441   ALBUMIN 4.5 11/09/2010 1042   AST 20 08/29/2014 1441   AST 26 11/09/2010 1042     ALT 15 08/29/2014 1441   ALT 16 11/09/2010 1042   ALKPHOS 143 08/29/2014 1441   ALKPHOS 124* 11/09/2010 1042   BILITOT 2.10* 08/29/2014 1441   BILITOT 2.4* 11/09/2010 1042   GFRNONAA >60 08/03/2015 1645   GFRAA >60 08/03/2015 1645    No results for input(s): CKTOTAL, CKMB, CKMBINDEX, TROPONINI in the last 168 hours. BNP (last 3 results) No results for input(s): BNP in the last 8760 hours.  ProBNP (last 3 results) No results for input(s): PROBNP in the last 8760 hours.   Radiological Exams on Admission: Dg Chest Port 1 View  08/03/2015  CLINICAL DATA:  Preop, left hip fracture. EXAM: PORTABLE CHEST 1 VIEW COMPARISON:  09/30/2007 FINDINGS: Hyperinflation and emphysema, unchanged from prior exam. There may be minimal vascular congestion but no pulmonary edema. The cardiomediastinal contours are normal. No consolidation, pleural effusion, or pneumothorax. No acute osseous abnormalities are seen. IMPRESSION: 1. Minimal vascular congestion, no overt edema or congestive failure. 2. Hyperinflation and emphysema. Electronically Signed   By: Jeb Levering M.D.   On: 08/03/2015 18:09   Dg Hip Unilat With Pelvis 2-3 Views Left  08/03/2015  CLINICAL DATA:  Fall all with left hip pain EXAM: DG HIP (WITH OR WITHOUT PELVIS) 2-3V LEFT COMPARISON:  03/02/2009 CT pelvis. FINDINGS: There is a comminuted nondisplaced intratrochanteric left proximal femur fracture. No left hip dislocation. No additional fracture. No suspicious focal osseous lesion. IMPRESSION: Comminuted nondisplaced intertrochanteric left proximal femur fracture. Electronically Signed   By: Ilona Sorrel M.D.   On: 08/03/2015 17:23   EKG: Independently reviewed. normal sinus rhythm, nonspecific ST and T waves changes.  Assessment/Plan 1. Closed intertrochanteric fracture of left femur [facility]) The patient is presenting with complaints of a mechanical fall. X-ray shows left-sided intertrochanteric fracture of the femur. At the [facility]  has been considered would be following up on the patient. Patient will remain nothing by mouth. IV morphine and IV Zofran as needed.  2.Preoperative medical evaluation  A) Cardiac risk: Based on RCRI the patient is a moderate risk for adverse Cardiac outcome from surgery due to her age. Recommend NO further work up with prior to surgery.  Be watchful of hydration  Monitor Ins and Out.  B) Pulmonary risk: The pt has history of heavy smoking and her x-ray shows probable emphysema. Recommend optimization of lung function with use of incentive spirometry and good pulmunary toilet. We will place her on Combivent 4 times a day  C) other recommendations Monitor for bleeding since the patient has cold agglutinin antibody syndrome  Will request Surgeon to please Order Lovenox/DVT prophylaxis of his/her choice when OK from Surgeon's standpoint post op.   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  4  Tobacco abuse Nicotine patch. Combivent 4 times a day.  5  Vitamin B 12 deficiency Continuing B-12 support.  6  Hypokalemia Maintaining preop potassium more than 4.  7  Fall Mechanical fall no acute head injury. Chronic neck pain with check x-ray of the neck preoperatively  Nutrition: Nothing by mouth except medication DVT Prophylaxis: mechanical compression device  Advance goals of care discussion: Full code as per my discussion with patient   Consults: Orthopedic  Family Communication: family was present at bedside, opportunity was given to ask question and all questions were answered satisfactorily at the time of interview. Disposition: Admitted as inpatient, med-surge unit.  Author: Berle Mull, MD [facility] Pager: [phone] 08/03/2015  If 7PM-7AM, please contact night-coverage [email or web address] Password [email or web address]

## 2015-08-03 NOTE — ED Notes (Signed)
Per telephone conversation with Dr. Loleta Books. Pt is waiting on Dr. Edmonia Lynch to come and see at beside. After which the writer will call Dr. Loleta Books back to give an update as to whether the patient will be admitted to [facility] or the opposite.

## 2015-08-03 NOTE — ED Notes (Signed)
MD at bedside. Dr. Percell Miller

## 2015-08-03 NOTE — ED Notes (Signed)
Dr. Posey Pronto at bedside discussing with Sister and Patient. Patient consenting for transfer. Orders will be placed.

## 2015-08-04 ENCOUNTER — Inpatient Hospital Stay (HOSPITAL_COMMUNITY): Payer: Medicare Other

## 2015-08-04 ENCOUNTER — Inpatient Hospital Stay (HOSPITAL_COMMUNITY): Payer: Medicare Other | Admitting: Certified Registered"

## 2015-08-04 ENCOUNTER — Encounter (HOSPITAL_COMMUNITY)
Admission: EM | Disposition: A | Discharge status: Skilled Nursing Facility | Payer: Self-pay | Source: Home / Self Care | Attending: Internal Medicine

## 2015-08-04 DIAGNOSIS — D591 Other autoimmune hemolytic anemias: Secondary | ICD-10-CM

## 2015-08-04 DIAGNOSIS — E43 Unspecified severe protein-calorie malnutrition: Secondary | ICD-10-CM | POA: Diagnosis present

## 2015-08-04 HISTORY — PX: FEMUR IM NAIL: SHX1597

## 2015-08-04 LAB — TYPE AND SCREEN
ABO/RH(D): B POS
ANTIBODY SCREEN: NEGATIVE
DAT, IGG: POSITIVE

## 2015-08-04 LAB — MRSA PCR SCREENING: MRSA BY PCR: NEGATIVE

## 2015-08-04 LAB — VITAMIN D 25 HYDROXY (VIT D DEFICIENCY, FRACTURES): Vit D, 25-Hydroxy: 12.5 ng/mL — ABNORMAL LOW (ref 30.0–100.0)

## 2015-08-04 SURGERY — INSERTION, INTRAMEDULLARY ROD, FEMUR
Anesthesia: General | Site: Hip | Laterality: Left

## 2015-08-04 MED ORDER — GLYCOPYRROLATE 0.2 MG/ML IJ SOLN
INTRAMUSCULAR | Status: DC | PRN
  Administered 2015-08-04: 0.6 mg via INTRAVENOUS

## 2015-08-04 MED ORDER — PHENYLEPHRINE HCL 10 MG/ML IJ SOLN
INTRAMUSCULAR | Status: DC | PRN
  Administered 2015-08-04: 40 ug via INTRAVENOUS

## 2015-08-04 MED ORDER — EPHEDRINE SULFATE 50 MG/ML IJ SOLN
INTRAMUSCULAR | Status: DC | PRN
  Administered 2015-08-04 (×2): 5 mg via INTRAVENOUS

## 2015-08-04 MED ORDER — ONDANSETRON HCL 4 MG/2ML IJ SOLN: 4.0000 mg | Freq: Once | INTRAMUSCULAR | Status: DC | PRN

## 2015-08-04 MED ORDER — PHENOL 1.4 % MT LIQD: 1.0000 | OROMUCOSAL | Status: DC | PRN

## 2015-08-04 MED ORDER — FENTANYL CITRATE (PF) 100 MCG/2ML IJ SOLN
INTRAMUSCULAR | Status: DC | PRN
  Administered 2015-08-04: 25 ug via INTRAVENOUS
  Administered 2015-08-04: 100 ug via INTRAVENOUS

## 2015-08-04 MED ORDER — ROCURONIUM BROMIDE 100 MG/10ML IV SOLN
INTRAVENOUS | Status: DC | PRN
  Administered 2015-08-04: 30 mg via INTRAVENOUS

## 2015-08-04 MED ORDER — LACTATED RINGERS IV SOLN
INTRAVENOUS | Status: DC | PRN
  Administered 2015-08-04: 16:00:00 via INTRAVENOUS

## 2015-08-04 MED ORDER — CEFAZOLIN SODIUM-DEXTROSE 2-3 GM-% IV SOLR
2.0000 g | Freq: Four times a day (QID) | INTRAVENOUS | Status: AC
  Administered 2015-08-04: 2 g via INTRAVENOUS
  Filled 2015-08-04 (×2): qty 50

## 2015-08-04 MED ORDER — 0.9 % SODIUM CHLORIDE (POUR BTL) OPTIME
TOPICAL | Status: DC | PRN
  Administered 2015-08-04: 1000 mL

## 2015-08-04 MED ORDER — ENSURE ENLIVE PO LIQD
237.0000 mL | Freq: Two times a day (BID) | ORAL | Status: DC
  Administered 2015-08-05 – 2015-08-08 (×6): 237 mL via ORAL

## 2015-08-04 MED ORDER — MENTHOL 3 MG MT LOZG: 1.0000 | LOZENGE | OROMUCOSAL | Status: DC | PRN

## 2015-08-04 MED ORDER — NEOSTIGMINE METHYLSULFATE 10 MG/10ML IV SOLN
INTRAVENOUS | Status: DC | PRN
  Administered 2015-08-04: 4 mg via INTRAVENOUS

## 2015-08-04 MED ORDER — ACETAMINOPHEN 325 MG PO TABS
650.0000 mg | ORAL_TABLET | Freq: Four times a day (QID) | ORAL | Status: DC | PRN
  Filled 2015-08-04: qty 2

## 2015-08-04 MED ORDER — METOCLOPRAMIDE HCL 5 MG/ML IJ SOLN: 5.0000 mg | Freq: Three times a day (TID) | INTRAMUSCULAR | Status: DC | PRN

## 2015-08-04 MED ORDER — ASPIRIN 325 MG PO TBEC: 325.0000 mg | DELAYED_RELEASE_TABLET | Freq: Every day | ORAL | Status: DC

## 2015-08-04 MED ORDER — CEFAZOLIN SODIUM-DEXTROSE 2-3 GM-% IV SOLR
2.0000 g | INTRAVENOUS | Status: AC
  Administered 2015-08-04: 2 g via INTRAVENOUS

## 2015-08-04 MED ORDER — PROPOFOL 10 MG/ML IV BOLUS
INTRAVENOUS | Status: DC | PRN
  Administered 2015-08-04: 80 mg via INTRAVENOUS

## 2015-08-04 MED ORDER — LIDOCAINE HCL (CARDIAC) 20 MG/ML IV SOLN
INTRAVENOUS | Status: DC | PRN
  Administered 2015-08-04: 60 mg via INTRAVENOUS

## 2015-08-04 MED ORDER — ONDANSETRON HCL 4 MG/2ML IJ SOLN
INTRAMUSCULAR | Status: DC | PRN
  Administered 2015-08-04: 4 mg via INTRAVENOUS

## 2015-08-04 MED ORDER — CHLORHEXIDINE GLUCONATE 4 % EX LIQD
60.0000 mL | Freq: Once | CUTANEOUS | Status: DC
  Filled 2015-08-04: qty 60

## 2015-08-04 MED ORDER — ACETAMINOPHEN 500 MG PO TABS
1000.0000 mg | ORAL_TABLET | Freq: Once | ORAL | Status: AC
  Administered 2015-08-04: 1000 mg via ORAL
  Filled 2015-08-04: qty 2

## 2015-08-04 MED ORDER — ONDANSETRON HCL 4 MG PO TABS: 4.0000 mg | ORAL_TABLET | Freq: Three times a day (TID) | ORAL | Status: DC | PRN

## 2015-08-04 MED ORDER — HYDROCODONE-ACETAMINOPHEN 7.5-325 MG PO TABS
ORAL_TABLET | ORAL | Status: AC
  Administered 2015-08-04: 1 via ORAL
  Filled 2015-08-04: qty 1

## 2015-08-04 MED ORDER — ACETAMINOPHEN 650 MG RE SUPP: 650.0000 mg | Freq: Four times a day (QID) | RECTAL | Status: DC | PRN

## 2015-08-04 MED ORDER — HYDROMORPHONE HCL 1 MG/ML IJ SOLN
INTRAMUSCULAR | Status: AC
  Administered 2015-08-04: 0.25 mg via INTRAVENOUS
  Filled 2015-08-04: qty 1

## 2015-08-04 MED ORDER — HYDROCODONE-ACETAMINOPHEN 5-325 MG PO TABS: 1.0000 | ORAL_TABLET | Freq: Four times a day (QID) | ORAL | Status: DC | PRN

## 2015-08-04 MED ORDER — IPRATROPIUM-ALBUTEROL 0.5-2.5 (3) MG/3ML IN SOLN: 3.0000 mL | Freq: Four times a day (QID) | RESPIRATORY_TRACT | Status: DC | PRN

## 2015-08-04 MED ORDER — METOCLOPRAMIDE HCL 5 MG PO TABS: 5.0000 mg | ORAL_TABLET | Freq: Three times a day (TID) | ORAL | Status: DC | PRN

## 2015-08-04 MED ORDER — HYDROCODONE-ACETAMINOPHEN 7.5-325 MG PO TABS
1.0000 | ORAL_TABLET | Freq: Once | ORAL | Status: AC | PRN
  Administered 2015-08-04: 1 via ORAL

## 2015-08-04 MED ORDER — FENTANYL CITRATE (PF) 250 MCG/5ML IJ SOLN
INTRAMUSCULAR | Status: AC
  Filled 2015-08-04: qty 5

## 2015-08-04 MED ORDER — HYDROCODONE-ACETAMINOPHEN 5-325 MG PO TABS
ORAL_TABLET | ORAL | Status: AC
  Filled 2015-08-04: qty 1

## 2015-08-04 MED ORDER — HYDROMORPHONE HCL 1 MG/ML IJ SOLN
0.2500 mg | INTRAMUSCULAR | Status: DC | PRN
  Administered 2015-08-04 (×4): 0.25 mg via INTRAVENOUS

## 2015-08-04 SURGICAL SUPPLY — 35 items
BNDG GAUZE ELAST 4 BULKY (GAUZE/BANDAGES/DRESSINGS) ×1 IMPLANT
CLSR STERI-STRIP ANTIMIC 1/2X4 (GAUZE/BANDAGES/DRESSINGS) ×1 IMPLANT
COVER PERINEAL POST (MISCELLANEOUS) ×2 IMPLANT
COVER SURGICAL LIGHT HANDLE (MISCELLANEOUS) ×2 IMPLANT
DRAPE STERI IOBAN 125X83 (DRAPES) ×2 IMPLANT
DRSG MEPILEX BORDER 4X4 (GAUZE/BANDAGES/DRESSINGS) ×3 IMPLANT
DURAPREP 26ML APPLICATOR (WOUND CARE) ×2 IMPLANT
ELECT REM PT RETURN 9FT ADLT (ELECTROSURGICAL) ×2
ELECTRODE REM PT RTRN 9FT ADLT (ELECTROSURGICAL) ×1 IMPLANT
GLOVE BIO SURGEON STRL SZ7 (GLOVE) ×2 IMPLANT
GLOVE BIO SURGEON STRL SZ7.5 (GLOVE) ×2 IMPLANT
GLOVE BIOGEL PI IND STRL 7.0 (GLOVE) ×1 IMPLANT
GLOVE BIOGEL PI IND STRL 8 (GLOVE) ×1 IMPLANT
GLOVE BIOGEL PI INDICATOR 7.0 (GLOVE) ×1
GLOVE BIOGEL PI INDICATOR 8 (GLOVE) ×1
GOWN STRL REUS W/ TWL LRG LVL3 (GOWN DISPOSABLE) ×1 IMPLANT
GOWN STRL REUS W/TWL LRG LVL3 (GOWN DISPOSABLE) ×6
GUIDEROD T2 3X1000 (ROD) ×1 IMPLANT
K-WIRE  3.2X450M STR (WIRE) ×1
K-WIRE 3.2X450M STR (WIRE) ×1
KIT BASIN OR (CUSTOM PROCEDURE TRAY) ×2 IMPLANT
KIT NAIL LONG 10X360MMX125 (Nail) ×1 IMPLANT
KIT ROOM TURNOVER OR (KITS) ×2 IMPLANT
KWIRE 3.2X450M STR (WIRE) IMPLANT
NS IRRIG 1000ML POUR BTL (IV SOLUTION) ×2 IMPLANT
PACK GENERAL/GYN (CUSTOM PROCEDURE TRAY) ×2 IMPLANT
PAD ARMBOARD 7.5X6 YLW CONV (MISCELLANEOUS) ×4 IMPLANT
SCREW LAG GAMMA 3 TI 10.5X80MM (Screw) ×1 IMPLANT
SUT MNCRL AB 4-0 PS2 18 (SUTURE) IMPLANT
SUT MON AB 2-0 CT1 36 (SUTURE) IMPLANT
SUT VIC AB 0 CT1 27 (SUTURE) ×2
SUT VIC AB 0 CT1 27XBRD ANBCTR (SUTURE) ×1 IMPLANT
TOWEL OR 17X24 6PK STRL BLUE (TOWEL DISPOSABLE) ×2 IMPLANT
TOWEL OR 17X26 10 PK STRL BLUE (TOWEL DISPOSABLE) ×2 IMPLANT
WATER STERILE IRR 1000ML POUR (IV SOLUTION) ×2 IMPLANT

## 2015-08-04 NOTE — Interval H&P Note (Signed)
History and Physical Interval Note:  08/04/2015 12:36 PM  [name]  has presented today for surgery, with the diagnosis of hip fx  The various methods of treatment have been discussed with the patient and family. After consideration of risks, benefits and other options for treatment, the patient has consented to  Procedure(s): INTRAMEDULLARY (IM) NAIL FEMORAL (Left) as a surgical intervention .  The patient's history has been reviewed, patient examined, no change in status, stable for surgery.  I have reviewed the patient's chart and labs.  Questions were answered to the patient's satisfaction.     Jansen Goodpasture D

## 2015-08-04 NOTE — H&P (View-Only) (Signed)
ORTHOPAEDIC CONSULTATION  REQUESTING PHYSICIAN: [name], MD  Chief Complaint: L hip pain  HPI: [name] is a 74 y.o. female who complains of L hip pain after a mechanical fall on 11/10.  The patient reports to have been stepping off a curb when she tripped and fell onto her left side.  She had pain with weight bearing after that.  Patient denies hitting her head or LOC.  The patient was transported to the ED, where she was found to have a L hip fracture.  Ambulatory without any assistive devices at baseline.   History reviewed. No pertinent past medical history. Past Surgical History  Procedure Laterality Date  . Tonsillectomy    . Appendectomy    . Abdominal hysterectomy    . Exploratory laparotomy     Social History   Social History  . Marital Status: Divorced    Spouse Name: N/A  . Number of Children: N/A  . Years of Education: N/A   Social History Main Topics  . Smoking status: Current Every Day Smoker -- 1.00 packs/day    Types: Cigarettes  . Smokeless tobacco: None  . Alcohol Use: Yes     Comment: occ  . Drug Use: None  . Sexual Activity: Not Asked   Other Topics Concern  . None   Social History Narrative   History reviewed. No pertinent family history. No Known Allergies Prior to Admission medications   Medication Sig Start Date End Date Taking? Authorizing Provider  Cyanocobalamin ([employer] B-12) 1000 MCG TBCR TAKE 1 TABLET DAILY 03/30/15  Yes Lennis Marion Downer, MD  docusate sodium (COLACE) 50 MG capsule Take 50 mg by mouth daily.   Yes Historical Provider, MD  folic acid (FOLVITE) 1 MG tablet TAKE 1 TABLET BY MOUTH DAILY 05/19/15  Yes Gordy Levan, MD   Dg Chest Port 1 View  08/03/2015  CLINICAL DATA:  Preop, left hip fracture. EXAM: PORTABLE CHEST 1 VIEW COMPARISON:  09/30/2007 FINDINGS: Hyperinflation and emphysema, unchanged from prior exam. There may be minimal vascular congestion but no pulmonary edema. The cardiomediastinal  contours are normal. No consolidation, pleural effusion, or pneumothorax. No acute osseous abnormalities are seen. IMPRESSION: 1. Minimal vascular congestion, no overt edema or congestive failure. 2. Hyperinflation and emphysema. Electronically Signed   By: Jeb Levering M.D.   On: 08/03/2015 18:09   Dg Hip Unilat With Pelvis 2-3 Views Left  08/03/2015  CLINICAL DATA:  Fall all with left hip pain EXAM: DG HIP (WITH OR WITHOUT PELVIS) 2-3V LEFT COMPARISON:  03/02/2009 CT pelvis. FINDINGS: There is a comminuted nondisplaced intratrochanteric left proximal femur fracture. No left hip dislocation. No additional fracture. No suspicious focal osseous lesion. IMPRESSION: Comminuted nondisplaced intertrochanteric left proximal femur fracture. Electronically Signed   By: Ilona Sorrel M.D.   On: 08/03/2015 17:23    Positive ROS: All other systems have been reviewed and were otherwise negative with the exception of those mentioned in the HPI and as above.  Labs cbc  Recent Labs  08/03/15 1645  WBC 11.9*  HGB 10.5*  HCT 29.9*  PLT 443*    Labs inflam No results for input(s): CRP in the last 72 hours.  Invalid input(s): ESR  Labs coag  Recent Labs  08/03/15 1937  INR 1.06     Recent Labs  08/03/15 1645  NA 141  K 3.8  CL 103  CO2 30  GLUCOSE 95  BUN 9  CREATININE 0.52  CALCIUM 9.0  Physical Exam: Filed Vitals:   08/03/15 1938  BP: 121/44  Pulse: 81  Temp:   Resp: 15   General: Alert, no acute distress Cardiovascular: No pedal edema Respiratory: No cyanosis, no use of accessory musculature GI: No organomegaly, abdomen is soft and non-tender Skin: No lesions in the area of chief complaint other than those listed below in MSK exam.  Neurologic: Sensation intact distally Psychiatric: Patient is competent for consent with normal mood and affect Lymphatic: No axillary or cervical lymphadenopathy  MUSCULOSKELETAL:  L hip has so erythema.  Mild swelling and  ecchymosis.  Leg is not shortened or mal-rotated. Tender to palpation.  Pain with log roll of L leg.  Sensation intact with 2+ distal pulses.  Other extremities are atraumatic with painless ROM and NVI.  Assessment: Comminuted non-displaced intertrochanteric hip fracture after a mechanical fall on 11/10  Plan: Patient was found to have L hip fracture on imaging.  Both operative and non-op management discusses.  Recommending surgical intervention to help improve mobility and decrease pain.  Risk of surgery discussed.  Patient understands and wishes to proceed with surgical correction.  Patient will remain NPO after midnight and on bedrest.   Gae Dry, PA-C Cell [phone]   08/03/2015 8:38 PM

## 2015-08-04 NOTE — Progress Notes (Signed)
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 Will monitor. Avoid cold.   Tobacco abuse   Vitamin B 12 deficiency   Fall  Code Status:  full Family Communication:  Patient is lucid Disposition Plan:    HPI/Subjective: Pain controlled. No shortness of breath or chest pain.  Objective: Filed Vitals:   08/04/15 0652  BP: 123/44  Pulse: 63  Temp: 98.6 F (37 C)  Resp: 16    Intake/Output Summary (Last 24 hours) at 08/04/15 1138 Last data filed at 08/04/15 [id number]  Gross per 24 hour  Intake      0 ml  Output    100 ml  Net   -100 ml   Filed Weights   08/03/15 1554  Weight: 39.009 kg (86 lb)    Exam:   General:  Alert, oriented.  Cardiovascular: Regular rate rhythm without murmurs gallops rubs  Respiratory: Clear to auscultation bilaterally without wheezes rhonchi or rales  Abdomen: Soft nontender nondistended  Ext: No clubbing cyanosis or edema.  Basic Metabolic Panel:  Recent Labs Lab 08/03/15 1645  NA 141  K 3.8  CL 103  CO2 30  GLUCOSE 95  BUN 9  CREATININE 0.52  CALCIUM 9.0   Liver Function Tests: No results for input(s): AST, ALT, ALKPHOS, BILITOT, PROT, ALBUMIN in the last 168 hours. No results for input(s): LIPASE, AMYLASE in the last 168 hours. No results for input(s): AMMONIA in the last 168 hours. CBC:  Recent Labs Lab 08/03/15 1645  WBC 11.9*  HGB 10.5*  HCT 29.9*  MCV 111.2*  PLT 443*   Cardiac Enzymes: No results for input(s): CKTOTAL, CKMB, CKMBINDEX, TROPONINI in the last 168 hours. BNP (last 3 results) No results for input(s): BNP in the last 8760 hours.  ProBNP (last 3 results) No results for input(s): PROBNP in the last 8760  hours.  CBG: No results for input(s): GLUCAP in the last 168 hours.  No results found for this or any previous visit (from the past 240 hour(s)).   Studies: Dg Cervical Spine Complete  08/03/2015  CLINICAL DATA:  Status post fall today with chronic neck pain. EXAM: CERVICAL SPINE - COMPLETE 4+ VIEW COMPARISON:  None. FINDINGS: There is no evidence of cervical spine fracture or prevertebral soft tissue swelling. Alignment is normal. Degenerative joint change since of the spine with narrowed joint space osteophyte formation are identified. IMPRESSION: No acute fracture or dislocation. Osteoarthritic changes of cervical spine. Electronically Signed   By: Abelardo Diesel M.D.   On: 08/03/2015 20:48   Dg Chest Port 1 View  08/03/2015  CLINICAL DATA:  Preop, left hip fracture. EXAM: PORTABLE CHEST 1 VIEW COMPARISON:  09/30/2007 FINDINGS: Hyperinflation and emphysema, unchanged from prior exam. There may be minimal vascular congestion but no pulmonary edema. The cardiomediastinal contours are normal. No consolidation, pleural effusion, or pneumothorax. No acute osseous abnormalities are seen. IMPRESSION: 1. Minimal vascular congestion, no overt edema or congestive failure. 2. Hyperinflation and emphysema. Electronically Signed   By: Jeb Levering M.D.   On: 08/03/2015 18:09   Dg Hip Unilat With Pelvis 2-3 Views Left  08/03/2015  CLINICAL DATA:  Fall all with left hip pain EXAM: DG HIP (WITH OR WITHOUT PELVIS) 2-3V LEFT COMPARISON:  03/02/2009 CT pelvis. FINDINGS: There is a  comminuted nondisplaced intratrochanteric left proximal femur fracture. No left hip dislocation. No additional fracture. No suspicious focal osseous lesion. IMPRESSION: Comminuted nondisplaced intertrochanteric left proximal femur fracture. Electronically Signed   By: Ilona Sorrel M.D.   On: 08/03/2015 17:23    Scheduled Meds: . aspirin EC  325 mg Oral Daily  .  ceFAZolin (ANCEF) IV  2 g Intravenous To SSTC  . chlorhexidine  60  mL Topical Once  . docusate sodium  50 mg Oral Daily  . ferrous sulfate  325 mg Oral TID PC  . folic acid  1 mg Oral Daily  . nicotine  14 mg Transdermal Daily  . vitamin B-12  1,000 mcg Oral Daily   Continuous Infusions:   Time spent: 35 minutes  [place] [facility] [email or web address], password [facility] 08/04/2015, 11:38 AM  LOS: 1 day

## 2015-08-04 NOTE — Progress Notes (Signed)
Utilization review completed.  

## 2015-08-04 NOTE — Transfer of Care (Signed)
Immediate Anesthesia Transfer of Care Note  Patient: [name]  Procedure(s) Performed: Procedure(s): INTRAMEDULLARY (IM) NAIL FEMORAL (Left)  Patient Location: PACU  Anesthesia Type:General  Level of Consciousness: awake, patient cooperative and responds to stimulation  Airway & Oxygen Therapy: Patient Spontanous Breathing and Patient connected to nasal cannula oxygen  Post-op Assessment: Report given to RN, Post -op Vital signs reviewed and stable and Patient moving all extremities X 4  Post vital signs: Reviewed and stable  Last Vitals:  Filed Vitals:   08/04/15 1249  BP: 117/37  Pulse: 60  Temp: 36.9 C  Resp: 18    Complications: No apparent anesthesia complications

## 2015-08-04 NOTE — Progress Notes (Signed)
Initial Nutrition Assessment  DOCUMENTATION CODES:   Severe malnutrition in context of chronic illness, Underweight  INTERVENTION:  Once diet advances, provide Ensure Enlive po BID, each supplement provides 350 kcal and 20 grams of protein.  NUTRITION DIAGNOSIS:   Malnutrition related to chronic illness as evidenced by severe depletion of body fat, severe depletion of muscle mass.  GOAL:   Patient will meet greater than or equal to 90% of their needs  MONITOR:   Diet advancement, Skin, Weight trends, Labs, I & O's  REASON FOR ASSESSMENT:    (Low BMI)    ASSESSMENT:   74 y.o. female who complains of L hip pain after a mechanical fall on 11/10. The patient reports to have been stepping off a curb when she tripped and fell onto her left side. She had pain with weight bearing after that. Patient denies hitting her head or LOC. The patient was transported to the ED, where she was found to have a L hip fracture.  Pt is currently NPO for surgery today. Pt reports having a good appetite PTA with consuming of at least 3 meals a day with no other difficulties. Weight has been stable. Pt is agreeable to Ensure to aid in healing once diet advances. Pt does report she usually drinks protein shakes at home on occasions.   Nutrition-Focused physical exam completed. Findings are severe fat depletion, severe muscle depletion, and no edema.   Labs and medications reviewed.   Diet Order:  Diet NPO time specified Except for: Ice Chips, Sips with Meds Diet NPO time specified  Skin:  Reviewed, no issues  Last BM:  Unknown  Height:   Ht Readings from Last 1 Encounters:  08/03/15 5' (1.524 m)    Weight:   Wt Readings from Last 1 Encounters:  08/03/15 86 lb (39.009 kg)    Ideal Body Weight:  45.45 kg  BMI:  Body mass index is 16.8 kg/(m^2).  Estimated Nutritional Needs:   Kcal:  1350-1550  Protein:  60-70 grams  Fluid:  >/- 1.5 L/day  EDUCATION NEEDS:   No education  needs identified at this time  Corrin Parker, MS, RD, LDN Pager # [phone] After hours/ weekend pager # [phone]

## 2015-08-04 NOTE — Anesthesia Preprocedure Evaluation (Signed)
Anesthesia Evaluation  Patient identified by MRN, date of birth, ID band Patient awake    Reviewed: Allergy & Precautions, H&P , NPO status , Patient's Chart, lab work & pertinent test results  History of Anesthesia Complications Negative for: history of anesthetic complications  Airway Mallampati: II  TM Distance: >3 FB Neck ROM: full    Dental no notable dental hx.    Pulmonary Current Smoker,    Pulmonary exam normal breath sounds clear to auscultation       Cardiovascular negative cardio ROS Normal cardiovascular exam Rhythm:regular Rate:Normal     Neuro/Psych negative neurological ROS     GI/Hepatic negative GI ROS, Neg liver ROS,   Endo/Other  negative endocrine ROS  Renal/GU negative Renal ROS     Musculoskeletal   Abdominal   Peds  Hematology negative hematology ROS (+)   Anesthesia Other Findings Cold agglutinin, 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 Tobacco abuse, nicotine patch  Reproductive/Obstetrics negative OB ROS                             Anesthesia Physical Anesthesia Plan  ASA: III  Anesthesia Plan: General   Post-op Pain Management:    Induction: Intravenous  Airway Management Planned: Oral ETT  Additional Equipment:   Intra-op Plan:   Post-operative Plan: Extubation in OR  Informed Consent: I have reviewed the patients History and Physical, chart, labs and discussed the procedure including the risks, benefits and alternatives for the proposed anesthesia with the patient or authorized representative who has indicated his/her understanding and acceptance.   Dental Advisory Given  Plan Discussed with: Anesthesiologist, CRNA and Surgeon  Anesthesia Plan Comments:         Anesthesia Quick Evaluation

## 2015-08-04 NOTE — Progress Notes (Signed)
[facility] hospitalist progress note. Chief complaint. Transfer note. History of present illness. This 74 year old female presented to [facility] long hospital status post mechanical fall on her left side. She noted immediate pain and radiology indicated a closed intertrochanteric fracture of the left femur. Orthopedics were contacted and requested patient be sent to [facility] for further evaluation and probable surgical repair. Patient is now arrived in transfer and I'm seeing her at bedside to ensure she remains clinically stable and that her orders transferred here appropriately. She has no current complaints. Physical exam. Vital signs. Temperature 98.2, pulse 60, respiration 18, blood pressure 127/51. O2 sats 99%. General appearance. Well-developed female who is alert and in no distress. Cardiac. Rate and rhythm regular. Lungs. Breath sounds clear and equal. Abdomen. Soft with positive bowel sounds. Extremities. Peripheral pulses are intact at the pedal sights. Impression/plan. Problem #1. Closed intertrochanteric fracture of the left femur. Patient will be seen by orthopedics later this morning with plan of care to follow. Problem #2. Etiopathic chronic cold agglutinin disease. Monitor for bleeding, avoid cold. Problem #3. 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 Nicotine patch. Problem #4. Vitamin B-12 deficiency. Continue B-12 support. Problem #5. Hypokalemia. Maintain preop potassium more than 4. The patient appears clinically stable post transfer. All orders appear to of transferred appropriately.

## 2015-08-04 NOTE — Op Note (Signed)
DATE OF SURGERY:  08/04/2015  TIME: 4:37 PM  PATIENT NAME:  [name]  AGE: 74 y.o.  PRE-OPERATIVE DIAGNOSIS:  Left hip fx  POST-OPERATIVE DIAGNOSIS:  SAME  PROCEDURE:  INTRAMEDULLARY (IM) NAIL FEMORAL  SURGEON:  Linford Quintela D  ASSISTANT:  [name], PA-C, She was present and scrubbed throughout the case, critical for completion in a timely fashion, and for retraction, instrumentation, and closure.   OPERATIVE IMPLANTS: Stryker Gamma Nail with distal interlock screw  PREOPERATIVE INDICATIONS:  [name] is a 74 y.o. year old who fell and suffered a hip fracture. She was brought into the ER and then admitted and optimized and then elected for surgical intervention.    The risks benefits and alternatives were discussed with the patient including but not limited to the risks of nonoperative treatment, versus surgical intervention including infection, bleeding, nerve injury, malunion, nonunion, hardware prominence, hardware failure, need for hardware removal, blood clots, cardiopulmonary complications, morbidity, mortality, among others, and they were willing to proceed.    OPERATIVE PROCEDURE:  The patient was brought to the operating room and placed in the supine position. General anesthesia was administered, with a foley. She was placed on the fracture table.  Closed reduction was performed under C-arm guidance. The length of the femur was also measured using fluoroscopy. Time out was then performed after sterile prep and drape. She received preoperative antibiotics.  Incision was made proximal to the greater trochanter. A guidewire was placed in the appropriate position. Confirmation was made on AP and lateral views. The above-named nail was opened. I opened the proximal femur with a reamer. I then placed the nail by hand easily down. I did not need to ream the femur.  Once the nail was completely seated, I placed a guidepin into the femoral head into the  center center position. I measured the length, and then reamed the lateral cortex and up into the head. I then placed the lag screw. Slight compression was applied. Anatomic fixation achieved. Bone quality was mediocre.  I then secured the proximal interlocking bolt, and took off a half a turn, and then removed the instruments, and took final C-arm pictures AP and lateral the entire length of the leg.   Anatomic reconstruction was achieved, and the wounds were irrigated copiously and closed with Vicryl followed by staples and sterile gauze for the skin. The patient was awakened and returned to PACU in stable and satisfactory condition. There no complications and the patient tolerated the procedure well.  She will be weightbearing as tolerated, and will be on ASA  for a period of four weeks after discharge.   [name], M.D.    This note was generated using a template and dragon dictation system. In light of that, I have reviewed the note and all aspects of it are applicable to this case. Any dictation errors are due to the computerized dictation system.

## 2015-08-04 NOTE — Anesthesia Procedure Notes (Signed)
Procedure Name: Intubation Date/Time: 08/04/2015 3:52 PM Performed by: Tressia Miners LEFFEW Pre-anesthesia Checklist: Patient identified, Patient being monitored, Timeout performed, Emergency Drugs available and Suction available Patient Re-evaluated:Patient Re-evaluated prior to inductionOxygen Delivery Method: Circle System Utilized Preoxygenation: Pre-oxygenation with 100% oxygen Intubation Type: IV induction Ventilation: Mask ventilation without difficulty Laryngoscope Size: Mac and 3 Grade View: Grade I Tube type: Oral Tube size: 7.0 mm Number of attempts: 1 Airway Equipment and Method: Stylet Placement Confirmation: ETT inserted through vocal cords under direct vision,  positive ETCO2 and breath sounds checked- equal and bilateral Secured at: 22 cm Tube secured with: Tape Dental Injury: Teeth and Oropharynx as per pre-operative assessment

## 2015-08-04 NOTE — Discharge Instructions (Signed)
INSTRUCTIONS ° °o Remove items at home which could result in a fall. This includes throw rugs or furniture in walking pathways °o ICE to the affected joint every three hours while awake for 30 minutes at a time, for at least the first 3-5 days, and then as needed for pain and swelling.  Continue to use ice for pain and swelling. You may notice swelling that will progress down to the foot and ankle.  This is normal after surgery.  Elevate your leg when you are not up walking on it.   °o Continue to use the breathing machine you got in the hospital (incentive spirometer) which will help keep your temperature down.  It is common for your temperature to cycle up and down following surgery, especially at night when you are not up moving around and exerting yourself.  The breathing machine keeps your lungs expanded and your temperature down. ° ° °DIET:  As you were doing prior to hospitalization, we recommend a well-balanced diet. ° °DRESSING / WOUND CARE / SHOWERING ° °Keep the surgical dressing until follow up.  IF THE DRESSING FALLS OFF or the wound gets wet inside, change the dressing with sterile gauze.  Please use good hand washing techniques before changing the dressing.  Do not use any lotions or creams on the incision until instructed by your surgeon.   ° °ACTIVITY ° °o Increase activity slowly as tolerated, but follow the weight bearing instructions below.   °o No driving for 6 weeks or until further direction given by your physician.  You cannot drive while taking narcotics.  °o No lifting or carrying greater than 10 lbs. until further directed by your surgeon. °o Avoid periods of inactivity such as sitting longer than an hour when not asleep. This helps prevent blood clots.  °o You may return to work once you are authorized by your doctor.  ° ° °WEIGHT BEARING  ° °Weight bearing as tolerated with assist device (walker, cane, etc) as directed, use it as long as suggested by your surgeon or therapist, typically  at least 4-6 weeks. ° ° °CONSTIPATION ° °Constipation is defined medically as fewer than three stools per week and severe constipation as less than one stool per week.  Even if you have a regular bowel pattern at home, your normal regimen is likely to be disrupted due to multiple reasons following surgery.  Combination of anesthesia, postoperative narcotics, change in appetite and fluid intake all can affect your bowels.  ° °YOU MUST use at least one of the following options; they are listed in order of increasing strength to get the job done.  They are all available over the counter, and you may need to use some, POSSIBLY even all of these options:   ° °Drink plenty of fluids (prune juice may be helpful) and high fiber foods °Colace 100 mg by mouth twice a day  °Senokot for constipation as directed and as needed Dulcolax (bisacodyl), take with full glass of water  °Miralax (polyethylene glycol) once or twice a day as needed. ° °If you have tried all these things and are unable to have a bowel movement in the first 3-4 days after surgery call either your surgeon or your primary doctor.   ° °If you experience loose stools or diarrhea, hold the medications until you stool forms back up.  If your symptoms do not get better within 1 week or if they get worse, check with your doctor.  If you experience "the worst   abdominal pain ever" or develop nausea or vomiting, please contact the office immediately for further recommendations for treatment. ° ° °ITCHING:  If you experience itching with your medications, try taking only a single pain pill, or even half a pain pill at a time.  You can also use Benadryl over the counter for itching or also to help with sleep.  ° °TED HOSE STOCKINGS:  Use stockings on both legs until for at least 2 weeks or as directed by physician office. They may be removed at night for sleeping. ° °MEDICATIONS:  See your medication summary on the “After Visit Summary” that nursing will review with you.   You may have some home medications which will be placed on hold until you complete the course of blood thinner medication.  It is important for you to complete the blood thinner medication as prescribed. ° °PRECAUTIONS:  If you experience chest pain or shortness of breath - call 911 immediately for transfer to the hospital emergency department.  ° °If you develop a fever greater that 101 F, purulent drainage from wound, increased redness or drainage from wound, foul odor from the wound/dressing, or calf pain - CONTACT YOUR SURGEON.   °                                                °FOLLOW-UP APPOINTMENTS:  If you do not already have a post-op appointment, please call the office for an appointment to be seen by your surgeon.  Guidelines for how soon to be seen are listed in your “After Visit Summary”, but are typically between 1-4 weeks after surgery. ° °MAKE SURE YOU:  °• Understand these instructions.  °• Get help right away if you are not doing well or get worse.  ° ° °Thank you for letting us be a part of your medical care team.  It is a privilege we respect greatly.  We hope these instructions will help you stay on track for a fast and full recovery!  ° °

## 2015-08-04 NOTE — ED Notes (Signed)
[facility] at bedside 

## 2015-08-04 NOTE — Progress Notes (Signed)
Report received from Shannon at [facility] (ED)/ Awaiting pts arrival via [facility]. Hence Derrick P

## 2015-08-05 LAB — HEMOGLOBIN AND HEMATOCRIT, BLOOD
HEMATOCRIT: 22.2 % — AB (ref 36.0–46.0)
Hemoglobin: 7.9 g/dL — ABNORMAL LOW (ref 12.0–15.0)

## 2015-08-05 NOTE — Progress Notes (Addendum)
Subjective: 1 Day Post-Op Procedure(s) (LRB): INTRAMEDULLARY (IM) NAIL FEMORAL (Left) Patient reports pain as mild.  No nausea/vomiting, lightheadedness/dizziness, chest pain/sob.    Objective: Vital signs in last 24 hours: Temp:  [97.5 F (36.4 C)-98.5 F (36.9 C)] 97.6 F (36.4 C) (11/12 0527) Pulse Rate:  [59-80] 71 (11/12 0527) Resp:  [11-21] 18 (11/12 0527) BP: (104-156)/(37-54) 104/39 mmHg (11/12 0527) SpO2:  [95 %-100 %] 100 % (11/12 0527)  Intake/Output from previous day: 11/11 0701 - 11/12 0700 In: 750 [I.V.:750] Out: 925 [Urine:875; Blood:50] Intake/Output this shift: Total I/O In: -  Out: 200 [Urine:200]   Recent Labs  08/03/15 1645  HGB 10.5*    Recent Labs  08/03/15 1645  WBC 11.9*  RBC 2.69*  HCT 29.9*  PLT 443*    Recent Labs  08/03/15 1645  NA 141  K 3.8  CL 103  CO2 30  BUN 9  CREATININE 0.52  GLUCOSE 95  CALCIUM 9.0    Recent Labs  08/03/15 1937  INR 1.06    Neurologically intact Neurovascular intact Sensation intact distally Intact pulses distally Dorsiflexion/Plantar flexion intact Compartment soft  Assessment/Plan: 1 Day Post-Op Procedure(s) (LRB): INTRAMEDULLARY (IM) NAIL FEMORAL (Left) Advance diet Up with therapy Discharge to SNF when medically stable WBAT LLE Continue ASA 325 mg for dvt ppx Dry dressing change prn Continue plan per medicine  Fannie Knee 08/05/2015, 9:34 AM

## 2015-08-05 NOTE — Progress Notes (Signed)
   08/05/15 1855  What Happened  Was fall witnessed? No  Was patient injured? No  Patient found on floor  Found by Staff-comment  Stated prior activity bathroom-unassisted  Follow Up  MD notified yes Conley Canal)  Time MD notified [phone]  Additional tests No  Progress note created (see row info) Yes  Vitals  Temp 98.1 F (36.7 C)  Temp Source Oral  BP (!) 121/41 mmHg  MAP (mmHg) 58  BP Location Left Arm  BP Method Automatic  Patient Position (if appropriate) Sitting  Pulse Rate 99  Pulse Rate Source Dinamap  Resp 18  Oxygen Therapy  SpO2 94 %  O2 Device Room Air

## 2015-08-05 NOTE — NC FL2 (Signed)
[place] LEVEL OF CARE SCREENING TOOL     IDENTIFICATION  Patient Name: [name] Birthdate: [birth year] Sex: female Admission Date (Current Location): 08/03/2015  [facility] and [place] Number: Herbalist and Address:  [facility] [place] [address] [place], [place] [address]      Provider Number: [id number]  Attending Physician Name and Address:  Delfina Redwood, MD  Relative Name and Phone Number:       Current Level of Care: Hospital Recommended Level of Care: [place] Prior Approval Number:    Date Approved/Denied:   PASRR Number: [id number]  Discharge Plan: SNF    Current Diagnoses: Patient Active Problem List   Diagnosis Date Noted  . Protein-calorie malnutrition, severe 08/04/2015  . Closed intertrochanteric fracture of left femur ([place]) 08/03/2015  . Fall 08/03/2015  . Closed right hip fracture ([place]) 08/03/2015  . Hyperbilirubinemia 08/01/2013  . Hypokalemia 07/30/2013  . Vitamin B 12 deficiency 08/07/2012  . 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[place]) 06/10/2012  . Tobacco abuse 06/10/2012    Orientation ACTIVITIES/SOCIAL BLADDER RESPIRATION    Self, Time, Situation, Place    Incontinent, Indwelling catheter O2 (As needed) (2L)  BEHAVIORAL SYMPTOMS/MOOD NEUROLOGICAL BOWEL NUTRITION STATUS      Continent Diet (Reg)  PHYSICIAN VISITS COMMUNICATION OF NEEDS Height & Weight Skin    Verbally 5' (152.4 cm) 86 lbs. Surgical wounds          AMBULATORY STATUS RESPIRATION    Assist extensive O2 (As needed) (2L)      Personal Care Assistance Level of Assistance  Bathing, Feeding, Dressing Bathing Assistance: Limited assistance Feeding assistance: Independent Dressing Assistance: Limited assistance      Functional Limitations Info  Sight, Hearing, Speech Sight Info: Adequate Hearing Info: Adequate Speech Info: Adequate       SPECIAL CARE FACTORS FREQUENCY  PT (By  licensed PT), OT (By licensed OT)                   Additional Factors Info  Code Status, Allergies Code Status Info: FULL Allergies Info: NKA           Current Medications (08/05/2015): Current Facility-Administered Medications  Medication Dose Route Frequency Provider Last Rate Last Dose  . acetaminophen (TYLENOL) tablet 650 mg  650 mg Oral Q6H PRN [name], PA-C       Or  . acetaminophen (TYLENOL) suppository 650 mg  650 mg Rectal Q6H PRN [name], PA-C      . aspirin EC tablet 325 mg  325 mg Oral Daily [name], MD   325 mg at 08/05/15 0909  . docusate sodium (COLACE) capsule 50 mg  50 mg Oral Daily [name], MD   50 mg at 08/05/15 0911  . feeding supplement (ENSURE ENLIVE) (ENSURE ENLIVE) liquid 237 mL  237 mL Oral BID BM Dale Moyock, RD      . ferrous sulfate tablet 325 mg  325 mg Oral TID PC [name], MD   325 mg at 08/05/15 0909  . folic acid (FOLVITE) tablet 1 mg  1 mg Oral Daily [name], MD   1 mg at 08/05/15 0909  . HYDROcodone-acetaminophen (NORCO/VICODIN) 5-325 MG per tablet 1-2 tablet  1-2 tablet Oral Q6H PRN [name], MD   1 tablet at 08/04/15 1728  . ipratropium-albuterol (DUONEB) 0.5-2.5 (3) MG/3ML nebulizer solution 3 mL  3 mL Inhalation Q6H PRN Corinna L  Conley Canal, MD      . menthol-cetylpyridinium (CEPACOL) lozenge 3 mg  1 lozenge Oral PRN [name], PA-C       Or  . phenol (CHLORASEPTIC) mouth spray 1 spray  1 spray Mouth/Throat PRN Brittney Kelly, PA-C      . methocarbamol (ROBAXIN) tablet 500 mg  500 mg Oral Q6H PRN [name], MD   500 mg at 08/04/15 0054   Or  . methocarbamol (ROBAXIN) 500 mg in dextrose 5 % 50 mL IVPB  500 mg Intravenous Q6H PRN [name], MD      . metoCLOPramide (REGLAN) tablet 5-10 mg  5-10 mg Oral Q8H PRN [name], PA-C       Or  . metoCLOPramide (REGLAN) injection 5-10 mg  5-10 mg Intravenous Q8H PRN [name], PA-C      . morphine 2 MG/ML injection 2-4 mg   2-4 mg Intravenous Q4H PRN [name], MD   2 mg at 08/04/15 0054  . nicotine (NICODERM CQ - dosed in mg/24 hours) patch 14 mg  14 mg Transdermal Daily [name], MD   14 mg at 08/05/15 0911  . ondansetron (ZOFRAN) injection 4 mg  4 mg Intravenous Q6H PRN [name], MD   4 mg at 08/03/15 2143  . senna-docusate (Senokot-S) tablet 1 tablet  1 tablet Oral QHS PRN [name], MD      . vitamin B-12 (CYANOCOBALAMIN) tablet 1,000 mcg  1,000 mcg Oral Daily [name], MD   1,000 mcg at 08/05/15 0909   Do not use this list as official medication orders. Please verify with discharge summary.  Discharge Medications:   Medication List    TAKE these medications        aspirin 325 MG EC tablet  Take 1 tablet (325 mg total) by mouth daily.     HYDROcodone-acetaminophen 5-325 MG tablet  Commonly known as:  NORCO  Take 1-2 tablets by mouth every 6 (six) hours as needed for moderate pain.     ondansetron 4 MG tablet  Commonly known as:  ZOFRAN  Take 1 tablet (4 mg total) by mouth every 8 (eight) hours as needed for nausea or vomiting.      ASK your doctor about these medications        Cyanocobalamin 1000 MCG Tbcr  Commonly known as:  [employer] B-12  TAKE 1 TABLET DAILY     docusate sodium 50 MG capsule  Commonly known as:  COLACE  Take 50 mg by mouth daily.     folic acid 1 MG tablet  Commonly known as:  FOLVITE  TAKE 1 TABLET BY MOUTH DAILY        Relevant Imaging Results:  Relevant Lab Results:  Recent Labs    Additional Information    Boone Master, LCSW

## 2015-08-05 NOTE — Progress Notes (Signed)
Informed by NT that Pt had fallen and was found by environmental services.  Pt already in chair with 2 other RNs present.  Per report Pt was found sitting on the floor and assisted into the chair. Pt has no complaints of pain.  Vital signs are stable.  Flowsheet filled out.  Bed alarm on.  Reinforced with return demonstration using call button and Pt voices understanding that she must not attempt to get out of bed by herself.  Will report thoroughly.

## 2015-08-05 NOTE — Clinical Social Work Note (Signed)
Clinical Social Work Assessment  Patient Details  Name: [name] MRN: [id number] Date of Birth: 05-Jul-1941  Date of referral:  08/05/15               Reason for consult:  Facility Placement                Permission sought to share information with:  PCP Permission granted to share information::  Yes, Verbal Permission Granted  Name::     Pam  Agency::     Relationship::  sister  Contact Information:     Housing/Transportation Living arrangements for the past 2 months:  Single Family Home Source of Information:  Patient Patient Interpreter Needed:  None Criminal Activity/Legal Involvement Pertinent to Current Situation/Hospitalization:  No - Comment as needed Significant Relationships:  Siblings Lives with:  Self Do you feel safe going back to the place where you live?  No Need for family participation in patient care:  Yes (Comment)  Care giving concerns:  Pt lives home alone and pt and family interested in SNF placement.   Social Worker assessment / plan:  CSW received referral in order to assist with DC planning. CSW reviewed chart and met with patient and sister at bedside. CSW introduced myself and explained role.  Pt lives home alone but has support from sister and 65 yr old mother. Pt aware that after surgery that SNF placement would be needed. Family is possibly interested in [place] but agreeable to [facility] search. CSW provided SNF list and explained process. Family will tour SNFs and will assist with decision making.  FL2 completed and PASRR received. CSW will follow up with bed offers.  Employment status:  Retired Nurse, adult PT Recommendations:  Not assessed at this time Information / Referral to community resources:  [place]  Patient/Family's Response to care:  Patient drowsy during assessment and allowed sister to ask most of the questions.  Patient/Family's Understanding of and Emotional  Response to Diagnosis, Current Treatment, and Prognosis:  Family understanding of process and will assist pt at home once she has recovered. Family plans to visit in SNF often.  Emotional Assessment Appearance:  Appears stated age Attitude/Demeanor/Rapport:  Lethargic Affect (typically observed):  Flat Orientation:  Oriented to Self, Oriented to Place, Oriented to  Time Alcohol / Substance use:  Not Applicable Psych involvement (Current and /or in the community):  No (Comment)  Discharge Needs  Concerns to be addressed:  No discharge needs identified Readmission within the last 30 days:  No Current discharge risk:  None Barriers to Discharge:  No Barriers Identified   Boone Master, [place] 08/05/2015, 2:30 PM Weekend Coverage

## 2015-08-05 NOTE — Progress Notes (Signed)
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 Will monitor. Avoid cold. Component of postoperative anemia from blood loss   Tobacco abuse   Vitamin B 12 deficiency   Fall  Code Status:  full Family Communication:  sister Disposition Plan:    HPI/Subjective: Pain controlled. No shortness of breath or chest pain.  Objective: Filed Vitals:   08/05/15 0527  BP: 104/39  Pulse: 71  Temp: 97.6 F (36.4 C)  Resp: 18    Intake/Output Summary (Last 24 hours) at 08/05/15 1257 Last data filed at 08/05/15 0911  Gross per 24 hour  Intake    750 ml  Output   1125 ml  Net   -375 ml   Filed Weights   08/03/15 1554  Weight: 39.009 kg (86 lb)    Exam:   General:  Alert, oriented.  Cardiovascular: Regular rate rhythm without murmurs gallops rubs  Respiratory: Clear to auscultation bilaterally without wheezes rhonchi or rales  Abdomen: Soft nontender nondistended  Ext: dressing CDI  Basic Metabolic Panel:  Recent Labs Lab 08/03/15 1645  NA 141  K 3.8  CL 103  CO2 30  GLUCOSE 95  BUN 9  CREATININE 0.52  CALCIUM 9.0   Liver Function Tests: No results for input(s): AST, ALT, ALKPHOS, BILITOT, PROT, ALBUMIN in the last 168 hours. No results for input(s): LIPASE, AMYLASE in the last 168 hours. No results for input(s): AMMONIA in the last 168 hours. CBC:  Recent Labs Lab 08/03/15 1645  WBC 11.9*  HGB 10.5*  HCT 29.9*  MCV 111.2*  PLT 443*   Cardiac Enzymes: No results for input(s): CKTOTAL, CKMB, CKMBINDEX, TROPONINI in the last 168 hours. BNP (last 3 results) No results for input(s): BNP in the last 8760 hours.  ProBNP (last 3 results) No results for input(s):  PROBNP in the last 8760 hours.  CBG: No results for input(s): GLUCAP in the last 168 hours.  Recent Results (from the past 240 hour(s))  MRSA PCR Screening     Status: None   Collection Time: 08/04/15  1:57 PM  Result Value Ref Range Status   MRSA by PCR NEGATIVE NEGATIVE Final    Comment:        The GeneXpert MRSA Assay (FDA approved for NASAL specimens only), is one component of a comprehensive MRSA colonization surveillance program. It is not intended to diagnose MRSA infection nor to guide or monitor treatment for MRSA infections.      Studies: Dg Cervical Spine Complete  08/03/2015  CLINICAL DATA:  Status post fall today with chronic neck pain. EXAM: CERVICAL SPINE - COMPLETE 4+ VIEW COMPARISON:  None. FINDINGS: There is no evidence of cervical spine fracture or prevertebral soft tissue swelling. Alignment is normal. Degenerative joint change since of the spine with narrowed joint space osteophyte formation are identified. IMPRESSION: No acute fracture or dislocation. Osteoarthritic changes of cervical spine. Electronically Signed   By: Abelardo Diesel M.D.   On: 08/03/2015 20:48   Dg Chest Port 1 View  08/03/2015  CLINICAL DATA:  Preop, left hip fracture. EXAM: PORTABLE CHEST 1 VIEW COMPARISON:  09/30/2007 FINDINGS: Hyperinflation and emphysema, unchanged from prior exam. There may be minimal vascular congestion but no pulmonary edema. The cardiomediastinal contours are normal. No consolidation, pleural effusion, or pneumothorax.  No acute osseous abnormalities are seen. IMPRESSION: 1. Minimal vascular congestion, no overt edema or congestive failure. 2. Hyperinflation and emphysema. Electronically Signed   By: Jeb Levering M.D.   On: 08/03/2015 18:09   Dg C-arm 1-60 Min  08/04/2015  CLINICAL DATA:  ORIF left hip . EXAM: LEFT FEMUR 2 VIEWS; DG C-ARM 61-120 MIN COMPARISON:  08/03/2015. FINDINGS: ORIF left hip. Hardware intact. Near anatomic alignment.Three images. 0 minutes 39  seconds fluoroscopy time. IMPRESSION: ORIF left hip. Electronically Signed   By: Marcello Moores  Register   On: 08/04/2015 16:55   Dg Hip Unilat With Pelvis 2-3 Views Left  08/03/2015  CLINICAL DATA:  Fall all with left hip pain EXAM: DG HIP (WITH OR WITHOUT PELVIS) 2-3V LEFT COMPARISON:  03/02/2009 CT pelvis. FINDINGS: There is a comminuted nondisplaced intratrochanteric left proximal femur fracture. No left hip dislocation. No additional fracture. No suspicious focal osseous lesion. IMPRESSION: Comminuted nondisplaced intertrochanteric left proximal femur fracture. Electronically Signed   By: Ilona Sorrel M.D.   On: 08/03/2015 17:23   Dg Femur Min 2 Views Left  08/04/2015  CLINICAL DATA:  ORIF left hip . EXAM: LEFT FEMUR 2 VIEWS; DG C-ARM 61-120 MIN COMPARISON:  08/03/2015. FINDINGS: ORIF left hip. Hardware intact. Near anatomic alignment.Three images. 0 minutes 39 seconds fluoroscopy time. IMPRESSION: ORIF left hip. Electronically Signed   By: Marcello Moores  Register   On: 08/04/2015 16:55   Dg Femur Port Min 2 Views Left  08/04/2015  CLINICAL DATA:  ORIF of femur fracture. EXAM: LEFT FEMUR PORTABLE 2 VIEWS COMPARISON:  Operative images same day. FINDINGS: Gamma nail fixation of a low intertrochanteric fracture of the left femur. Components appear well positioned. Alignment appears anatomic. The distal aspect of the nail is very close to the anterior cortex of the distal femur but I do not see a fracture. No distal locking screws. IMPRESSION: Gamma nail ORIF of low intertrochanteric fracture. Electronically Signed   By: Nelson Chimes M.D.   On: 08/04/2015 18:42    Scheduled Meds: . aspirin EC  325 mg Oral Daily  . docusate sodium  50 mg Oral Daily  . feeding supplement (ENSURE ENLIVE)  237 mL Oral BID BM  . ferrous sulfate  325 mg Oral TID PC  . folic acid  1 mg Oral Daily  . nicotine  14 mg Transdermal Daily  . vitamin B-12  1,000 mcg Oral Daily   Continuous Infusions:   Time spent: 35  minutes  [place] [facility] [email or web address], password [facility] 08/05/2015, 12:57 PM  LOS: 2 days

## 2015-08-05 NOTE — Evaluation (Signed)
Physical Therapy Evaluation Patient Details Name: [name] MRN: [id number] DOB: 1941-03-09 Today's Date: 08/05/2015   History of Present Illness  Pt is 74 yo female w/p fall while walking in community, sustained closed intertrochanteric left femur fx.  She underwent Lt IM nail 08/04/15.  Clinical Impression  Patient with little to no available assist upon discharge home, therefore she may benefit from short term rehab stay prior to discharge home.  Family appears very supportive but may not be able to provide physical assist and consistent care with schedules.  Patient may benefit from further skilled PT to improve mobility, reduce fall risk and incr functional independence. Oxygen saturation decr to 87% on RA with basic supine exercise, returned to 99% with 2L via nasal cannula.    Follow Up Recommendations SNF;Supervision for mobility/OOB    Equipment Recommendations  None recommended by PT;Other (comment) (will need to confirm borrowed equip correct size)    Recommendations for Other Services OT consult     Precautions / Restrictions Precautions Precautions: Posterior Hip;Fall Precaution Booklet Issued: Yes (comment) Precaution Comments: verbal review of precautions Restrictions Weight Bearing Restrictions: Yes LLE Weight Bearing: Weight bearing as tolerated      Mobility  Bed Mobility Overal bed mobility: Needs Assistance Bed Mobility: Supine to Sit     Supine to sit: Min assist     General bed mobility comments: use of Rt rail  Transfers Overall transfer level: Needs assistance Equipment used: Rolling walker (2 wheeled) Transfers: Sit to/from Stand Sit to Stand: Min assist         General transfer comment: min cues for hand placement and to keep Lt leg extended  Ambulation/Gait Ambulation/Gait assistance: Min assist Ambulation Distance (Feet): 25 Feet Assistive device: Rolling walker (2 wheeled) Gait Pattern/deviations: Step-to  pattern;Decreased step length - right;Decreased stance time - left;Decreased weight shift to left;Antalgic     General Gait Details: limited by fatigue  Stairs            Wheelchair Mobility    Modified Rankin (Stroke Patients Only)       Balance Overall balance assessment: Needs assistance Sitting-balance support: No upper extremity supported;Feet unsupported Sitting balance-Leahy Scale: Good     Standing balance support: Bilateral upper extremity supported Standing balance-Leahy Scale: Poor                               Pertinent Vitals/Pain Pain Assessment: 0-10 Pain Score: 7  Pain Location: left hip Pain Descriptors / Indicators: Sharp;Sore;Aching Pain Intervention(s): Limited activity within patient's tolerance;Monitored during session;Other (comment) (pt declined need for medication)    Home Living Family/patient expects to be discharged to:: Private residence Living Arrangements: Alone   Type of Home: House Home Access: Stairs to enter Entrance Stairs-Rails: Psychiatric nurse of Steps: [place]: One level Home Equipment: Environmental consultant - 2 wheels;Cane - single point;Bedside commode;Transport chair;Other (comment) (these will be borrowed items from family member) Additional Comments: did not own any equipment prior to admission    Prior Function Level of Independence: Independent               Hand Dominance   Dominant Hand: Right    Extremity/Trunk Assessment   Upper Extremity Assessment: Overall WFL for tasks assessed           Lower Extremity Assessment: Generalized weakness      Cervical / Trunk Assessment: Normal  Communication   Communication: No difficulties  Cognition Arousal/Alertness: Awake/alert Behavior During Therapy: WFL for tasks assessed/performed Overall Cognitive Status: Within Functional Limits for tasks assessed       Memory: Decreased recall of precautions               General Comments General comments (skin integrity, edema, etc.): sister present during evaluation    Exercises Total Joint Exercises Ankle Circles/Pumps: AROM;Both;10 reps;Supine Quad Sets: AROM;Left;10 reps;Supine Short Arc Quad: AAROM;Left;10 reps;Supine Heel Slides: AAROM;Left;10 reps;Supine Hip ABduction/ADduction: AAROM;Left;10 reps;Supine      Assessment/Plan    PT Assessment Patient needs continued PT services  PT Diagnosis Difficulty walking;Abnormality of gait;Generalized weakness;Acute pain   PT Problem List Decreased strength;Decreased range of motion;Decreased activity tolerance;Decreased balance;Decreased mobility;Decreased knowledge of use of DME;Decreased safety awareness;Decreased knowledge of precautions;Pain  PT Treatment Interventions DME instruction;Gait training;Stair training;Functional mobility training;Therapeutic activities;Therapeutic exercise;Balance training;Neuromuscular re-education;Patient/family education   PT Goals (Current goals can be found in the Care Plan section) Acute Rehab PT Goals Patient Stated Goal: return home PT Goal Formulation: With patient/family Time For Goal Achievement: 08/12/15 Potential to Achieve Goals: Good    Frequency Min 5X/week   Barriers to discharge Decreased caregiver support;Inaccessible home environment may be able to stay her mother's with only 1 STE    Co-evaluation               End of Session Equipment Utilized During Treatment: Gait belt Activity Tolerance: Patient tolerated treatment well;Patient limited by pain Patient left: in chair;with call bell/phone within reach;with family/visitor present Nurse Communication: Mobility status         Time: 1520-1555 PT Time Calculation (min) (ACUTE ONLY): 35 min   Charges:   PT Evaluation $Initial PT Evaluation Tier I: 1 Procedure PT Treatments $Gait Training: 8-22 mins   PT G CodesMalka So, [place] [id number]  [place] 08/05/2015,  4:20 PM

## 2015-08-06 DIAGNOSIS — D62 Acute posthemorrhagic anemia: Secondary | ICD-10-CM | POA: Diagnosis not present

## 2015-08-06 LAB — CBC
HCT: 18.1 % — ABNORMAL LOW (ref 36.0–46.0)
Hemoglobin: 6.6 g/dL — CL (ref 12.0–15.0)
MCH: 43.7 pg — AB (ref 26.0–34.0)
MCHC: 36.5 g/dL — ABNORMAL HIGH (ref 30.0–36.0)
MCV: 119.9 fL — AB (ref 78.0–100.0)
Platelets: 345 10*3/uL (ref 150–400)
RBC: 1.51 MIL/uL — ABNORMAL LOW (ref 3.87–5.11)
WBC: 15.3 10*3/uL — ABNORMAL HIGH (ref 4.0–10.5)

## 2015-08-06 LAB — PREPARE RBC (CROSSMATCH)

## 2015-08-06 MED ORDER — SENNA 8.6 MG PO TABS
2.0000 | ORAL_TABLET | Freq: Every day | ORAL | Status: DC
  Administered 2015-08-06 – 2015-08-08 (×3): 17.2 mg via ORAL
  Filled 2015-08-06 (×3): qty 2

## 2015-08-06 MED ORDER — SODIUM CHLORIDE 0.9 % IV SOLN
Freq: Once | INTRAVENOUS | Status: AC
  Administered 2015-08-06: 09:00:00 via INTRAVENOUS

## 2015-08-06 MED ORDER — DOCUSATE SODIUM 100 MG PO CAPS
100.0000 mg | ORAL_CAPSULE | Freq: Two times a day (BID) | ORAL | Status: DC
  Administered 2015-08-06 – 2015-08-08 (×5): 100 mg via ORAL
  Filled 2015-08-06 (×5): qty 1

## 2015-08-06 NOTE — Progress Notes (Signed)
PT Cancellation Note  Patient Details Name: [name] MRN: [id number] DOB: 12-24-1940   Cancelled Treatment:    Reason Eval/Treat Not Completed: Medical issues which prohibited therapy- Ortho PA requesting to hold this AM due to Hgb 6.6.  Will attempt to f/u later this afternoon if therapist able.    Sarajane Marek, [place] [phone] 08/06/2015

## 2015-08-06 NOTE — Progress Notes (Signed)
Subjective: 2 Days Post-Op Procedure(s) (LRB): INTRAMEDULLARY (IM) NAIL FEMORAL (Left) Patient reports pain as mild.  No nausea/vomiting, lightheadedness/dizziness, chest pain/sob.  Decreased appetite.  Objective: Vital signs in last 24 hours: Temp:  [98.1 F (36.7 C)-98.7 F (37.1 C)] 98.6 F (37 C) (11/13 0423) Pulse Rate:  [82-99] 95 (11/13 0423) Resp:  [18] 18 (11/13 0423) BP: (109-121)/(41-56) 119/56 mmHg (11/13 0423) SpO2:  [92 %-100 %] 92 % (11/13 0423)  Intake/Output from previous day: 11/12 0701 - 11/13 0700 In: 600 [P.O.:600] Out: 850 [Urine:850] Intake/Output this shift:     Recent Labs  08/03/15 1645 08/05/15 1237 08/06/15 0600  HGB 10.5* 7.9* 6.6*    Recent Labs  08/03/15 1645 08/05/15 1237 08/06/15 0600  WBC 11.9*  --  15.3*  RBC 2.69*  --  1.51*  HCT 29.9* 22.2* 18.1*  PLT 443*  --  345    Recent Labs  08/03/15 1645  NA 141  K 3.8  CL 103  CO2 30  BUN 9  CREATININE 0.52  GLUCOSE 95  CALCIUM 9.0    Recent Labs  08/03/15 1937  INR 1.06    Neurologically intact Neurovascular intact Sensation intact distally Intact pulses distally Dorsiflexion/Plantar flexion intact Compartment soft  Assessment/Plan: 2 Days Post-Op Procedure(s) (LRB): INTRAMEDULLARY (IM) NAIL FEMORAL (Left) Up with therapy Discharge to SNF once medically stable.  Patient has agreed to go to SNF for at least one week. WBAT LLE Continue ASA for DVT ppx UTI- on Rocephin per medicine Continue plan per medicine  Fannie Knee 08/06/2015, 9:45 AM

## 2015-08-06 NOTE — Progress Notes (Signed)
[facility] PROGRESS NOTE  [name] [id number] DOB: Dec 14, 1940 DOA: 08/03/2015 PCP: Horatio Pel, MD  Assessment/Plan:  Principal Problem:   Closed intertrochanteric fracture of left femur ([place]): s/p IM nail. D/2 Ms. 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  Component of postoperative anemia from blood loss. Will transfuse 1 unit prbc. Avoid cold   Tobacco abuse   Vitamin B 12 deficiency   Fall Delirium: seems improved today. Fell yesterday and was disoriented and trying to get OOB  Code Status:  full Family Communication:  Sister 11/12 Disposition Plan:    HPI/Subjective: Pain controlled. No shortness of breath or chest pain. No bleeding.   Objective: Filed Vitals:   08/06/15 0423  BP: 119/56  Pulse: 95  Temp: 98.6 F (37 C)  Resp: 18    Intake/Output Summary (Last 24 hours) at 08/06/15 0947 Last data filed at 08/05/15 1822  Gross per 24 hour  Intake    480 ml  Output    650 ml  Net   -170 ml   Filed Weights   08/03/15 1554  Weight: 39.009 kg (86 lb)    Exam:   General:  Alert, oriented. Appropriate and calm  Cardiovascular: Regular rate rhythm without murmurs gallops rubs  Respiratory: Clear to auscultation bilaterally without wheezes rhonchi or rales  Abdomen: Soft nontender nondistended  Ext: dressing CDI  Basic Metabolic Panel:  Recent Labs Lab 08/03/15 1645  NA 141  K 3.8  CL 103  CO2 30  GLUCOSE 95  BUN 9  CREATININE 0.52  CALCIUM 9.0   Liver Function Tests: No results for input(s): AST, ALT, ALKPHOS, BILITOT, PROT, ALBUMIN in the last 168 hours. No results for input(s): LIPASE, AMYLASE in the last 168 hours. No results for input(s): AMMONIA in the last 168 hours. CBC:  Recent Labs Lab 08/03/15 1645 08/05/15 1237 08/06/15 0600  WBC 11.9*  --  15.3*  HGB 10.5* 7.9* 6.6*  HCT 29.9* 22.2* 18.1*  MCV 111.2*  --  119.9*  PLT 443*  --   345   Cardiac Enzymes: No results for input(s): CKTOTAL, CKMB, CKMBINDEX, TROPONINI in the last 168 hours. BNP (last 3 results) No results for input(s): BNP in the last 8760 hours.  ProBNP (last 3 results) No results for input(s): PROBNP in the last 8760 hours.  CBG: No results for input(s): GLUCAP in the last 168 hours.  Recent Results (from the past 240 hour(s))  MRSA PCR Screening     Status: None   Collection Time: 08/04/15  1:57 PM  Result Value Ref Range Status   MRSA by PCR NEGATIVE NEGATIVE Final    Comment:        The GeneXpert MRSA Assay (FDA approved for NASAL specimens only), is one component of a comprehensive MRSA colonization surveillance program. It is not intended to diagnose MRSA infection nor to guide or monitor treatment for MRSA infections.      Studies: Dg C-arm 1-60 Min  08/04/2015  CLINICAL DATA:  ORIF left hip . EXAM: LEFT FEMUR 2 VIEWS; DG C-ARM 61-120 MIN COMPARISON:  08/03/2015. FINDINGS: ORIF left hip. Hardware intact. Near anatomic alignment.Three images. 0 minutes 39 seconds fluoroscopy time. IMPRESSION: ORIF left hip. Electronically Signed   By: Marcello Moores  Register   On: 08/04/2015 16:55   Dg Femur Min 2 Views Left  08/04/2015  CLINICAL DATA:  ORIF left hip . EXAM: LEFT FEMUR 2 VIEWS; DG C-ARM 61-120 MIN COMPARISON:  08/03/2015.  FINDINGS: ORIF left hip. Hardware intact. Near anatomic alignment.Three images. 0 minutes 39 seconds fluoroscopy time. IMPRESSION: ORIF left hip. Electronically Signed   By: Marcello Moores  Register   On: 08/04/2015 16:55   Dg Femur Port Min 2 Views Left  08/04/2015  CLINICAL DATA:  ORIF of femur fracture. EXAM: LEFT FEMUR PORTABLE 2 VIEWS COMPARISON:  Operative images same day. FINDINGS: Gamma nail fixation of a low intertrochanteric fracture of the left femur. Components appear well positioned. Alignment appears anatomic. The distal aspect of the nail is very close to the anterior cortex of the distal femur but I do not see a  fracture. No distal locking screws. IMPRESSION: Gamma nail ORIF of low intertrochanteric fracture. Electronically Signed   By: Nelson Chimes M.D.   On: 08/04/2015 18:42    Scheduled Meds: . sodium chloride   Intravenous Once  . aspirin EC  325 mg Oral Daily  . docusate sodium  50 mg Oral Daily  . feeding supplement (ENSURE ENLIVE)  237 mL Oral BID BM  . ferrous sulfate  325 mg Oral TID PC  . folic acid  1 mg Oral Daily  . nicotine  14 mg Transdermal Daily  . vitamin B-12  1,000 mcg Oral Daily   Continuous Infusions:   Time spent: 35 minutes  [place] [facility] [email or web address], password [facility] 08/06/2015, 9:47 AM  LOS: 3 days
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
## 2015-08-06 NOTE — Progress Notes (Signed)
Critical lab value called in to this nurse from lab. Hemoglobin 6.6. Text paged Attending: Conley Canal. Will continue to monitor.
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
## 2015-08-07 ENCOUNTER — Encounter (HOSPITAL_COMMUNITY): Payer: Self-pay | Admitting: Orthopedic Surgery

## 2015-08-07 DIAGNOSIS — R410 Disorientation, unspecified: Secondary | ICD-10-CM | POA: Diagnosis present

## 2015-08-07 LAB — BASIC METABOLIC PANEL WITH GFR
Anion gap: 8 (ref 5–15)
BUN: 13 mg/dL (ref 6–20)
CO2: 35 mmol/L — ABNORMAL HIGH (ref 22–32)
Calcium: 9.6 mg/dL (ref 8.9–10.3)
Chloride: 94 mmol/L — ABNORMAL LOW (ref 101–111)
Creatinine, Ser: 0.51 mg/dL (ref 0.44–1.00)
GFR calc Af Amer: >60 mL/min
GFR calc non Af Amer: >60 mL/min
Glucose, Bld: 126 mg/dL — ABNORMAL HIGH (ref 65–99)
Potassium: 4.6 mmol/L (ref 3.5–5.1)
Sodium: 137 mmol/L (ref 135–145)

## 2015-08-07 LAB — CBC
HCT: 24.1 % — ABNORMAL LOW (ref 36.0–46.0)
HEMOGLOBIN: 8.7 g/dL — AB (ref 12.0–15.0)
MCH: 35.8 pg — AB (ref 26.0–34.0)
MCHC: 36.1 g/dL — ABNORMAL HIGH (ref 30.0–36.0)
MCV: 99.2 fL (ref 78.0–100.0)
Platelets: 313 10*3/uL (ref 150–400)
RBC: 2.43 MIL/uL — AB (ref 3.87–5.11)
WBC: 16.6 10*3/uL — ABNORMAL HIGH (ref 4.0–10.5)

## 2015-08-07 MED ORDER — RISPERIDONE 0.5 MG PO TABS
0.5000 mg | ORAL_TABLET | Freq: Every day | ORAL | Status: DC
  Administered 2015-08-07: 0.5 mg via ORAL
  Filled 2015-08-07: qty 1

## 2015-08-07 MED ORDER — NICOTINE 21 MG/24HR TD PT24
21.0000 mg | MEDICATED_PATCH | Freq: Every day | TRANSDERMAL | Status: DC
  Administered 2015-08-08: 21 mg via TRANSDERMAL
  Filled 2015-08-07: qty 1

## 2015-08-07 NOTE — Clinical Social Work Note (Signed)
CSW met with patient and her family to present bed offers.  Patient's family agreed to go to [facility] or [facility] whichever has a bed available and also once she has been without a sitter for 24 hours.  CSW to continue to follow patient's progress, throughout discharge planning.  Jones Broom. Beaver, MSW, [place] 08/07/2015 3:49 PM

## 2015-08-07 NOTE — Care Management Important Message (Signed)
Important Message  Patient Details  Name: [name] MRN: [id number] Date of Birth: [birth year]   Medicare Important Message Given:  Yes    Eames Dibiasio P Jeannifer Drakeford 08/07/2015, 3:05 PM

## 2015-08-07 NOTE — Progress Notes (Signed)
Physical Therapy Treatment Patient Details Name: [name] MRN: [id number] DOB: 05/11/1941 Today's Date: 08/07/2015    History of Present Illness Pt is 74 yo female w/p fall while walking in community, sustained closed intertrochanteric left femur fx.  She underwent Lt IM nail 08/04/15.    PT Comments    Progressing toward PT goals as evident by ambulation of 177ft and ability to ascend/descend step. Patient still needs supervision and assistance for OOB activities and will continue to benefit from skilled PT services to increase independence for functional mobility. Continue progressing patient as tolerated with anticipation of d/c to SNF.  Follow Up Recommendations  SNF;Supervision for mobility/OOB     Equipment Recommendations  None recommended by PT    Recommendations for Other Services       Precautions / Restrictions Precautions Precautions: Fall (IM Nail-no hip precautions necessary; confirmed with Maija B Prater, PT before proceeding with tx today ) Restrictions Weight Bearing Restrictions: Yes LLE Weight Bearing: Weight bearing as tolerated    Mobility  Bed Mobility Overal bed mobility: Needs Assistance Bed Mobility: Supine to Sit     Supine to sit: Min assist     General bed mobility comments: use of Rt bed rail; verbal cues for sequencing; assistance for advancing L LE  Transfers Overall transfer level: Needs assistance Equipment used: Rolling walker (2 wheeled) Transfers: Sit to/from Stand Sit to Stand: Min guard         General transfer comment: verbal cues for hand placement  Ambulation/Gait Ambulation/Gait assistance: Min guard Ambulation Distance (Feet): 100 Feet Assistive device: Rolling walker (2 wheeled) Gait Pattern/deviations: Step-to pattern;Decreased stance time - left;Decreased step length - right;Antalgic Gait velocity: decreased   General Gait Details: verbal cues and demonstration for increased stride length and  facilitation of heel strike on Rt; demonstrated increased stride length post cues for short distances   Stairs Stairs: Yes Stairs assistance: Min guard Stair Management: With walker Number of Stairs: 1 General stair comments: pt's mother reported having one step into house without rails; educated on sequencing with use of AD  Wheelchair Mobility    Modified Rankin (Stroke Patients Only)       Balance Overall balance assessment: Needs assistance Sitting-balance support: No upper extremity supported Sitting balance-Leahy Scale: Good     Standing balance support: Bilateral upper extremity supported Standing balance-Leahy Scale: Poor                      Cognition Arousal/Alertness: Awake/alert Behavior During Therapy: WFL for tasks assessed/performed Overall Cognitive Status: Within Functional Limits for tasks assessed                      Exercises Total Joint Exercises Ankle Circles/Pumps: AROM;20 reps;Both;Strengthening Hip ABduction/ADduction: AROM;AAROM;Strengthening;Both;15 reps (X2; AAROM on Lt; tactile cues to prevent muscle substitution) Long Arc Quad: AROM;Strengthening;Both;15 reps (X2) Knee Flexion: AROM;Strengthening;Both;15 reps (X2)    General Comments General comments (skin integrity, edema, etc.): mother present for tx; pt had no c/o fatigue or increased pain throughout tx      Pertinent Vitals/Pain Pain Assessment: 0-10 Pain Score: 3  Pain Location: Left hip Pain Descriptors / Indicators: Discomfort Pain Intervention(s): Limited activity within patient's tolerance;Monitored during session    Home Living                      Prior Function            PT Goals (current goals can  now be found in the care plan section) Acute Rehab PT Goals Patient Stated Goal: return home PT Goal Formulation: With patient/family Time For Goal Achievement: 08/12/15 Potential to Achieve Goals: Good    Frequency  Min 5X/week    PT  Plan Current plan remains appropriate    Co-evaluation             End of Session Equipment Utilized During Treatment: Gait belt Activity Tolerance: Patient tolerated treatment well Patient left: in chair;with call bell/phone within reach;with family/visitor present     Time: 1456-1540 PT Time Calculation (min) (ACUTE ONLY): 44 min  Charges:  $Gait Training: 23-37 mins $Therapeutic Exercise: 8-22 mins                    G CodesDarliss Cheney, PTA [phone] 08/07/2015, 4:25 PM

## 2015-08-07 NOTE — Progress Notes (Addendum)
[facility] PROGRESS NOTE  [name] [id number] DOB: May 19, 1941 DOA: 08/03/2015 PCP: Horatio Pel, MD  Assessment/Plan:  Principal Problem:   Closed intertrochanteric fracture of left femur ([place]): s/p IM nail.  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 hgb 8.7 today, appropriate rise post transfusion Acute delirium: required sitter. Still disoriented. Will try small dose risperdal tonight. Hold opiates for now   Tobacco abuse   Vitamin B 12 deficiency   Fall  Code Status:  full Family Communication:  Sisters Disposition Plan:  SNF once no longer requireing sitter  HPI/Subjective: Pain controlled. No shortness of breath or chest pain. No bleeding.   Objective: Filed Vitals:   08/07/15 0543  BP: 108/40  Pulse: 89  Temp: 97.5 F (36.4 C)  Resp: 18    Intake/Output Summary (Last 24 hours) at 08/07/15 1630 Last data filed at 08/07/15 1136  Gross per 24 hour  Intake    340 ml  Output      0 ml  Net    340 ml   Filed Weights   08/03/15 1554  Weight: 39.009 kg (86 lb)    Exam:   General:  Alert, disoriented to place and time, but otherwise, fairly appropriate. Calm. cooperative  Cardiovascular: Regular rate rhythm without murmurs gallops rubs  Respiratory: Clear to auscultation bilaterally without wheezes rhonchi or rales  Abdomen: Soft nontender nondistended  Ext: dressing CDI  Basic Metabolic Panel:  Recent Labs Lab 08/03/15 1645 08/07/15 0835  NA 141 137  K 3.8 4.6  CL 103 94*  CO2 30 35*  GLUCOSE 95 126*  BUN 9 13  CREATININE 0.52 0.51  CALCIUM 9.0 9.6   Liver Function Tests: No results for input(s): AST, ALT, ALKPHOS, BILITOT, PROT, ALBUMIN in the last 168 hours. No results for input(s): LIPASE, AMYLASE in the last 168 hours. No results for input(s): AMMONIA in the last 168 hours. CBC:  Recent Labs Lab 08/03/15 1645 08/05/15 1237 08/06/15 0600  08/07/15 0835  WBC 11.9*  --  15.3* 16.6*  HGB 10.5* 7.9* 6.6* 8.7*  HCT 29.9* 22.2* 18.1* 24.1*  MCV 111.2*  --  119.9* 99.2  PLT 443*  --  345 313   Cardiac Enzymes: No results for input(s): CKTOTAL, CKMB, CKMBINDEX, TROPONINI in the last 168 hours. BNP (last 3 results) No results for input(s): BNP in the last 8760 hours.  ProBNP (last 3 results) No results for input(s): PROBNP in the last 8760 hours.  CBG: No results for input(s): GLUCAP in the last 168 hours.  Recent Results (from the past 240 hour(s))  MRSA PCR Screening     Status: None   Collection Time: 08/04/15  1:57 PM  Result Value Ref Range Status   MRSA by PCR NEGATIVE NEGATIVE Final    Comment:        The GeneXpert MRSA Assay (FDA approved for NASAL specimens only), is one component of a comprehensive MRSA colonization surveillance program. It is not intended to diagnose MRSA infection nor to guide or monitor treatment for MRSA infections.      Studies: No results found.  Scheduled Meds: . aspirin EC  325 mg Oral Daily  . docusate sodium  100 mg Oral BID  . feeding supplement (ENSURE ENLIVE)  237 mL Oral BID BM  . ferrous sulfate  325 mg Oral TID PC  . folic acid  1 mg Oral Daily  . nicotine  14 mg Transdermal Daily  . senna  2 tablet Oral Daily  . vitamin B-12  1,000 mcg Oral Daily   Continuous Infusions:   Time spent: 35 minutes  [facility] [email or web address], password [facility] 08/07/2015, 4:30 PM  LOS: 4 days

## 2015-08-07 NOTE — Progress Notes (Signed)
OT Cancellation Note  Patient Details Name: [name] MRN: [id number] DOB: [birth year]   Cancelled Treatment:    Reason Eval/Treat Not Completed: Other (comment) (Pt sleeping). Pt asleep upon arrival. Sitter/nurse tech advised not to wake pt up at this time; pt had a difficult morning. Will check back for OT eval as time allows and pt is appropriate.  Binnie Kand M.S., OTR/L Pager: [phone]  08/07/2015, 1:43 PM

## 2015-08-07 NOTE — Progress Notes (Signed)
     Subjective:  POD#3 IM nail of the L hip.  Patient reports pain as moderate.  Resting comfortably in bed this morning.  PT recommending SNF at discharge.  OK from ortho standpoint to discharge.  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  ABD soft Neurovascular intact Sensation intact distally Intact pulses distally Dorsiflexion/Plantar flexion intact Incision: dressing C/D/I   Lab Results  Component Value Date   WBC 15.3* 08/06/2015   HGB 6.6* 08/06/2015   HCT 18.1* 08/06/2015   MCV 119.9* 08/06/2015   PLT 345 08/06/2015   BMET    Component Value Date/Time   NA 141 08/03/2015 1645   NA 141 08/29/2014 1441   K 3.8 08/03/2015 1645   K 3.9 08/29/2014 1441   CL 103 08/03/2015 1645   CL 101 08/07/2012 1127   CO2 30 08/03/2015 1645   CO2 30* 08/29/2014 1441   GLUCOSE 95 08/03/2015 1645   GLUCOSE 88 08/29/2014 1441   GLUCOSE 98 08/07/2012 1127   BUN 9 08/03/2015 1645   BUN 10.5 08/29/2014 1441   CREATININE 0.52 08/03/2015 1645   CREATININE 0.6 08/29/2014 1441   CALCIUM 9.0 08/03/2015 1645   CALCIUM 8.8 08/29/2014 1441   GFRNONAA >60 08/03/2015 1645   GFRAA >60 08/03/2015 1645     Assessment/Plan: 3 Days Post-Op   Principal Problem:   Closed intertrochanteric fracture of left femur ([facility]) Active Problems:   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

## 2015-08-08 LAB — HEMOGLOBIN AND HEMATOCRIT, BLOOD
HEMATOCRIT: 23.4 % — AB (ref 36.0–46.0)
HEMOGLOBIN: 8.2 g/dL — AB (ref 12.0–15.0)

## 2015-08-08 MED ORDER — DOCUSATE SODIUM 100 MG PO CAPS: 50.0000 mg | ORAL_CAPSULE | Freq: Every day | ORAL | Status: DC

## 2015-08-08 MED ORDER — SENNA 8.6 MG PO TABS: 2.0000 | ORAL_TABLET | Freq: Every evening | ORAL | Status: DC | PRN

## 2015-08-08 MED ORDER — ACETAMINOPHEN 325 MG PO TABS: 650.0000 mg | ORAL_TABLET | Freq: Four times a day (QID) | ORAL | Status: DC | PRN

## 2015-08-08 MED ORDER — POLYETHYLENE GLYCOL 3350 17 G PO PACK: 17.0000 g | PACK | Freq: Every day | ORAL | Status: DC

## 2015-08-08 MED ORDER — NICOTINE 21 MG/24HR TD PT24: 21.0000 mg | MEDICATED_PATCH | Freq: Every day | TRANSDERMAL | Status: DC

## 2015-08-08 NOTE — Progress Notes (Signed)
Paged on call [facility] NP to d/c order for safety sitter. No order was ever placed. Patient no longer needs a Air cabin crew. Patient is alert and oriented with no impulsiveness throughout the night. Will continue to monitor

## 2015-08-08 NOTE — Evaluation (Signed)
Occupational Therapy Evaluation Patient Details Name: [name] MRN: [id number] DOB: 16-Apr-1941 Today's Date: 08/08/2015    History of Present Illness Pt is 74 yo female w/p fall while walking in community, sustained closed intertrochanteric left femur fx.  She underwent Lt IM nail 08/04/15.   Clinical Impression   Pt reports she was independent with ADLs and mobility PTA. Currently pt is overall supervision for functional mobility and ADLs with the exception of min assist for LB ADLs. Pt reports she would like to d/c home to her mothers house instead of SNF. Her mom reports that someone will be available to provide 24/7 supervision upon pts return home. At this time, I do think home is a viable option. Pt is oriented and not displaying any unsafe behavior during OT eval. If pt does return home, recommending HHOT and 24/7 supervision to maximize independence and safety with ADLs and mobility. Pt would benefit from continued skilled OT services in order to increase independence in LB ADLs and walk in shower transfers.     Follow Up Recommendations  Home health OT;Supervision/Assistance - 24 hour    Equipment Recommendations  None recommended by OT    Recommendations for Other Services       Precautions / Restrictions Precautions Precautions: Fall Restrictions Weight Bearing Restrictions: Yes LLE Weight Bearing: Weight bearing as tolerated      Mobility Bed Mobility Overal bed mobility: Needs Assistance Bed Mobility: Supine to Sit     Supine to sit: Supervision     General bed mobility comments: HOB elevated  Transfers Overall transfer level: Needs assistance Equipment used: Rolling walker (2 wheeled) Transfers: Sit to/from Stand Sit to Stand: Supervision         General transfer comment: VC for hand placement. Sit <> stand from EOB x1, toilet x 1    Balance Overall balance assessment: Needs assistance Sitting-balance support: No upper extremity  supported Sitting balance-Leahy Scale: Good     Standing balance support: No upper extremity supported;During functional activity Standing balance-Leahy Scale: Fair                              ADL Overall ADL's : Needs assistance/impaired Eating/Feeding: Set up;Sitting   Grooming: Wash/dry hands;Supervision/safety;Standing       Lower Body Bathing: Minimal assistance;Sit to/from stand       Lower Body Dressing: Minimal assistance;Sit to/from stand   Toilet Transfer: Supervision/safety;Ambulation;Comfort height toilet;RW   Toileting- Clothing Manipulation and Hygiene: Supervision/safety;Sit to/from stand Toileting - Clothing Manipulation Details (indicate cue type and reason): for toilet hygiene     Functional mobility during ADLs: Supervision/safety;Rolling walker General ADL Comments: Pts mother present for OT eval. Educated pt on home safety and need for close supervision druing ADLs and mobility; pt verbalized understanding.     Vision     Perception     Praxis      Pertinent Vitals/Pain Pain Assessment: 0-10 Pain Score: 2  Pain Location: L hip Pain Descriptors / Indicators: Sore Pain Intervention(s): Limited activity within patient's tolerance;Monitored during session;Repositioned     Hand Dominance Right   Extremity/Trunk Assessment Upper Extremity Assessment Upper Extremity Assessment: Overall WFL for tasks assessed   Lower Extremity Assessment Lower Extremity Assessment: Defer to PT evaluation   Cervical / Trunk Assessment Cervical / Trunk Assessment: Kyphotic   Communication Communication Communication: No difficulties   Cognition Arousal/Alertness: Awake/alert Behavior During Therapy: WFL for tasks assessed/performed Overall Cognitive Status: Within Functional  Limits for tasks assessed                     General Comments       Exercises       Shoulder Instructions      Home Living Family/patient expects to be  discharged to:: Private residence Living Arrangements: Alone (Will stay with mother if d/c home) Available Help at Discharge: Family;Personal care attendant;Available 24 hours/day Type of Home: House Home Access: Stairs to enter     Home Layout: One level     Bathroom Shower/Tub: Occupational psychologist: Handicapped height Bathroom Accessibility: Yes How Accessible: Accessible via walker Home Equipment: [place] - 2 wheels;Cane - single point;Bedside commode;Transport chair;Other (comment);Shower seat;Shower seat - built in;Hand held shower head          Prior Functioning/Environment Level of Independence: Independent             OT Diagnosis: Acute pain   OT Problem List: Impaired balance (sitting and/or standing);Decreased safety awareness;Decreased knowledge of use of DME or AE;Pain   OT Treatment/Interventions: Self-care/ADL training;DME and/or AE instruction;Patient/family education    OT Goals(Current goals can be found in the care plan section) Acute Rehab OT Goals Patient Stated Goal: to go home OT Goal Formulation: With patient/family Time For Goal Achievement: 08/22/15 Potential to Achieve Goals: Good ADL Goals Pt Will Perform Lower Body Bathing: with supervision;sit to/from stand Pt Will Perform Lower Body Dressing: with supervision;sit to/from stand Pt Will Perform Tub/Shower Transfer: Shower transfer;with supervision;ambulating;rolling walker;shower seat  OT Frequency: Min 2X/week   Barriers to D/C:            Co-evaluation              End of Session Equipment Utilized During Treatment: Gait belt;Rolling walker Nurse Communication: Mobility status  Activity Tolerance: Patient tolerated treatment well Patient left: in chair;with call bell/phone within reach;with family/visitor present;with chair alarm set   Time: [id number] OT Time Calculation (min): 23 min Charges:  OT General Charges $OT Visit: 1 Procedure OT  Evaluation $Initial OT Evaluation Tier I: 1 Procedure OT Treatments $Self Care/Home Management : 8-22 mins G-Codes:     Binnie Kand M.S., OTR/L Pager: [phone]  08/08/2015, 9:23 AM

## 2015-08-08 NOTE — Progress Notes (Signed)
     Subjective:  POD#4 IM nail of the L hip. Patient reports pain as mild to moderate.  Resting comfortably in bed.  Safety sitter was able to be discharged last night. Patient much less agitated.  Ok for transfer to SNF when appropriate from medical standpoint.   Objective:   VITALS:   Filed Vitals:   08/07/15 1530 08/07/15 2054 08/07/15 2317 08/08/15 0522  BP: 112/56 111/45 118/62 103/41  Pulse: 88 88 87 73  Temp: 97.1 F (36.2 C) 99.4 F (37.4 C) 98.5 F (36.9 C) 99 F (37.2 C)  TempSrc: Oral Oral Oral Oral  Resp: 20 20 18 17   Height:      Weight:      SpO2: 98% 99% 94% 93%    ABD soft Neurovascular intact Sensation intact distally Intact pulses distally Dorsiflexion/Plantar flexion intact Incision: dressing C/D/I   Lab Results  Component Value Date   WBC 16.6* 08/07/2015   HGB 8.7* 08/07/2015   HCT 24.1* 08/07/2015   MCV 99.2 08/07/2015   PLT 313 08/07/2015   BMET    Component Value Date/Time   NA 137 08/07/2015 0835   NA 141 08/29/2014 1441   K 4.6 08/07/2015 0835   K 3.9 08/29/2014 1441   CL 94* 08/07/2015 0835   CL 101 08/07/2012 1127   CO2 35* 08/07/2015 0835   CO2 30* 08/29/2014 1441   GLUCOSE 126* 08/07/2015 0835   GLUCOSE 88 08/29/2014 1441   GLUCOSE 98 08/07/2012 1127   BUN 13 08/07/2015 0835   BUN 10.5 08/29/2014 1441   CREATININE 0.51 08/07/2015 0835   CREATININE 0.6 08/29/2014 1441   CALCIUM 9.6 08/07/2015 0835   CALCIUM 8.8 08/29/2014 1441   GFRNONAA >60 08/07/2015 0835   GFRAA >60 08/07/2015 0835     Assessment/Plan: 4 Days Post-Op   Principal Problem:   Closed intertrochanteric fracture of left femur ([facility]) Active Problems:   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[facility])   Tobacco abuse   Vitamin B 12 deficiency   Hypokalemia   Fall   Closed right hip fracture ([facility])   Protein-calorie malnutrition, severe   Postoperative anemia due to acute blood loss   Acute delirium   Up with therapy WBAT in the LLE ASA 325mg   daily for dvt prophylaxis Ok to discharge to SNF from ortho standpoint.  We will see back in 10 days for post-op evaluation.    Aleasha Fregeau Lelan Pons 08/08/2015, 6:57 AM Cell [phone]

## 2015-08-08 NOTE — Progress Notes (Signed)
Call [facility] to give report on patient to Hoyt the nurse that will be taking this patient when she arrives.

## 2015-08-08 NOTE — Progress Notes (Signed)
Physical Therapy Treatment Patient Details Name: [name] MRN: [id number] DOB: 1940-12-09 Today's Date: 08/08/2015    History of Present Illness Pt is 74 yo female w/p fall while walking in community, sustained closed intertrochanteric left femur fx.  She underwent Lt IM nail 08/04/15.    PT Comments    Patient continues to progress toward mobility goals but continues to need supervision/min guard due to decreased safety awareness and decreased recall of information from previous tx. Patient would benefit from continued skilled PT services for normalized gait pattern to decrease risk of falls. Patient and mother agree that d/c to SNF will be beneficial.  Follow Up Recommendations  SNF;Supervision for mobility/OOB     Equipment Recommendations  None recommended by PT    Recommendations for Other Services       Precautions / Restrictions Precautions Precautions: Fall Restrictions Weight Bearing Restrictions: Yes LLE Weight Bearing: Weight bearing as tolerated    Mobility  Bed Mobility               General bed mobility comments: up in chair upon arrival  Transfers Overall transfer level: Needs assistance Equipment used: Rolling walker (2 wheeled) Transfers: Sit to/from [employer] Transfers Sit to Stand: Supervision         General transfer comment: sit to stand from recliner and BSC; verbal cues for hand placement and safe use of AD with gait pattern for turning  Ambulation/Gait Ambulation/Gait assistance: Min guard Ambulation Distance (Feet): 350 Feet Assistive device: Rolling walker (2 wheeled) Gait Pattern/deviations: Step-to pattern;Decreased stride length;Decreased step length - right Gait velocity: decreased   General Gait Details: verbal and visual cues for increased bilateral step length/height and facilitation of  bilateral heel strike to decrease risk of falls; ability to correct for short distances before need for cues; verbal  cues for righting eyes and increased safety awareness    Stairs         General stair comments: patient unable to recall proper sequencing for stair ambulation; educated on sequencing with RW   Wheelchair Mobility    Modified Rankin (Stroke Patients Only)       Balance Overall balance assessment: Needs assistance Sitting-balance support: No upper extremity supported Sitting balance-Leahy Scale: Good     Standing balance support: Bilateral upper extremity supported Standing balance-Leahy Scale: Fair                      Cognition Arousal/Alertness: Awake/alert Behavior During Therapy: WFL for tasks assessed/performed Overall Cognitive Status: Within Functional Limits for tasks assessed                      Exercises      General Comments General comments (skin integrity, edema, etc.): patient performed standing dynamic activities with bilateral and unilateral UE support including bending and reaching outside BOS with no LOB but continues to need supervision due to decreased safety awareness      Pertinent Vitals/Pain Pain Assessment: No/denies pain    Home Living                      Prior Function            PT Goals (current goals can now be found in the care plan section) Acute Rehab PT Goals Patient Stated Goal: go to grocery store PT Goal Formulation: With patient/family Time For Goal Achievement: 08/12/15 Potential to Achieve Goals: Good    Frequency  Min 5X/week  PT Plan Current plan remains appropriate    Co-evaluation             End of Session Equipment Utilized During Treatment: Gait belt Activity Tolerance: Patient tolerated treatment well Patient left: in chair;with call bell/phone within reach;with family/visitor present     Time: 1425-1505 PT Time Calculation (min) (ACUTE ONLY): 40 min  Charges:  $Gait Training: 8-22 mins $Therapeutic Activity: 8-22 mins                    G CodesEarney Navy, PTA [phone] 08/08/2015 3:42 PM

## 2015-08-08 NOTE — Discharge Summary (Signed)
Physician Discharge Summary  [name] [id number] DOB: 1940/10/04 DOA: 08/03/2015  PCP: Horatio Pel, MD  Admit date: 08/03/2015 Discharge date: 08/08/2015  Time spent: Greater than 30 minutes  Recommendations for Outpatient Follow-up:  1. To skilled nursing facility 2. Monitor hemoglobin 3. Follow-up with Dr. Edmonia Lynch in 10 days 4. Activity weightbearing as tolerated   Discharge Diagnoses:  Principal Problem:   Closed intertrochanteric fracture of left femur (HCC) Active Problems:   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HCC)   Tobacco abuse   Vitamin B 12 deficiency   Fall   Protein-calorie malnutrition, severe   Postoperative anemia due to acute blood loss   Acute delirium   Discharge Condition: Stable  Diet recommendation: General  Filed Weights   08/03/15 1554  Weight: 39.009 kg (86 lb)    History of present illness:  74 y.o. female with Past medical history of cold agglutinin antibody. Patient presents with a mechanical fall. The patient had parked her car in the parking and was trying to walk on the curb. She hit the raised area of the curb and lost her balance and fell on the left side. She denies any head injury or neck injury. She denies any chest pain abdominal pain nausea or vomiting fever or chills. Denies any recent change in her medication. Next and denies any recurrent fall trauma or injury.  Does not have Coronary revascularization/CVA within 5 years. No recent stress test. Can Climb flight of stair, participates in recreational activity, household chores without getting out of breath. No Prior adverse event with anesthesia. No Alcohol use, drug use. Patient is an active smoker.  The patient is coming from home.  At her baseline ambulates without support And is independent for most of her ADL; manages her medication on her own.  Hospital Course:  Admitted to [facility]. Orthopedics consulted. Had  intramedullary nailing done. Postoperatively, hemoglobin did drop to 6.6 and she received 1 unit of packed red blood cells. Likely acute on chronic anemia related to postoperative blood loss. Hemoglobin has been above 8 for 2 days and no evidence of bleeding. Did develop postoperative delirium related to anesthesia. Has not required opiate analgesics for several days. Delirium much improved. She is alert and oriented, cooperative. Has been cleared by orthopedics for discharge. Has worked with physical therapy and will need 24-hour assistance/supervision and continued physical therapy. She lives alone and unable to arrange for 24-hour supervision, therefore will go to short-term skilled nursing facility. Orthopedics recommends aspirin 325 mg daily for DVT prophylaxis.    Procedures:  IM nail by Dr. Edmonia Lynch on 11/11  Consultations:  Orthopedics  Discharge Exam: Filed Vitals:   08/08/15 0522  BP: 103/41  Pulse: 73  Temp: 99 F (37.2 C)  Resp: 17    General: In chair. Relatively comfortable. Alert and completely oriented. Cooperative. Cardiovascular: Regular rate rhythm without murmurs gallops rubs Respiratory: Clear to auscultation bilaterally without wheezes rhonchi or rales Extremities: No clubbing cyanosis or edema. Dressing clean dry and intact.  Discharge Instructions   Discharge Instructions    Diet general    Complete by:  As directed      Walk with assistance    Complete by:  As directed      Weight bearing as tolerated    Complete by:  As directed   Laterality:  left  Extremity:  Lower          Current Discharge Medication List    START taking these medications  Details  acetaminophen (TYLENOL) 325 MG tablet Take 2 tablets (650 mg total) by mouth every 6 (six) hours as needed for mild pain (or Fever >/= 101).    aspirin EC 325 MG EC tablet Take 1 tablet (325 mg total) by mouth daily. Qty: 30 tablet, Refills: 0    HYDROcodone-acetaminophen (NORCO) 5-325  MG tablet Take 1-2 tablets by mouth every 6 (six) hours as needed for moderate pain. Qty: 90 tablet, Refills: 0    nicotine (NICODERM CQ - DOSED IN MG/24 HOURS) 21 mg/24hr patch Place 1 patch (21 mg total) onto the skin daily. Qty: 28 patch, Refills: 0    ondansetron (ZOFRAN) 4 MG tablet Take 1 tablet (4 mg total) by mouth every 8 (eight) hours as needed for nausea or vomiting. Qty: 20 tablet, Refills: 0    senna (SENOKOT) 8.6 MG TABS tablet Take 2 tablets (17.2 mg total) by mouth at bedtime as needed for mild constipation. Qty: 120 each, Refills: 0      CONTINUE these medications which have CHANGED   Details  docusate sodium (COLACE) 100 MG capsule Take 1 capsule (100 mg total) by mouth daily.      CONTINUE these medications which have NOT CHANGED   Details  Cyanocobalamin ([facility] B-12) 1000 MCG TBCR TAKE 1 TABLET DAILY Qty: 30 tablet, Refills: 4   Associated Diagnoses: Autoimmune hemolytic anemia (HCC)    folic acid (FOLVITE) 1 MG tablet TAKE 1 TABLET BY MOUTH DAILY Qty: 90 tablet, Refills: 1   Associated Diagnoses: Autoimmune hemolytic anemia (HCC)       No Known Allergies Follow-up Information    Follow up with MURPHY, TIMOTHY D, MD In 10 days.   Specialty:  Orthopedic Surgery   Contact information:   [address] [place] [id number] [phone]        The results of significant diagnostics from this hospitalization (including imaging, microbiology, ancillary and laboratory) are listed below for reference.    Significant Diagnostic Studies: Dg Cervical Spine Complete  08/03/2015  CLINICAL DATA:  Status post fall today with chronic neck pain. EXAM: CERVICAL SPINE - COMPLETE 4+ VIEW COMPARISON:  None. FINDINGS: There is no evidence of cervical spine fracture or prevertebral soft tissue swelling. Alignment is normal. Degenerative joint change since of the spine with narrowed joint space osteophyte formation are identified. IMPRESSION: No acute  fracture or dislocation. Osteoarthritic changes of cervical spine. Electronically Signed   By: Abelardo Diesel M.D.   On: 08/03/2015 20:48   Dg Chest Port 1 View  08/03/2015  CLINICAL DATA:  Preop, left hip fracture. EXAM: PORTABLE CHEST 1 VIEW COMPARISON:  09/30/2007 FINDINGS: Hyperinflation and emphysema, unchanged from prior exam. There may be minimal vascular congestion but no pulmonary edema. The cardiomediastinal contours are normal. No consolidation, pleural effusion, or pneumothorax. No acute osseous abnormalities are seen. IMPRESSION: 1. Minimal vascular congestion, no overt edema or congestive failure. 2. Hyperinflation and emphysema. Electronically Signed   By: Jeb Levering M.D.   On: 08/03/2015 18:09   Dg C-arm 1-60 Min  08/04/2015  CLINICAL DATA:  ORIF left hip . EXAM: LEFT FEMUR 2 VIEWS; DG C-ARM 61-120 MIN COMPARISON:  08/03/2015. FINDINGS: ORIF left hip. Hardware intact. Near anatomic alignment.Three images. 0 minutes 39 seconds fluoroscopy time. IMPRESSION: ORIF left hip. Electronically Signed   By: Marcello Moores  Register   On: 08/04/2015 16:55   Dg Hip Unilat With Pelvis 2-3 Views Left  08/03/2015  CLINICAL DATA:  Fall all with left hip  pain EXAM: DG HIP (WITH OR WITHOUT PELVIS) 2-3V LEFT COMPARISON:  03/02/2009 CT pelvis. FINDINGS: There is a comminuted nondisplaced intratrochanteric left proximal femur fracture. No left hip dislocation. No additional fracture. No suspicious focal osseous lesion. IMPRESSION: Comminuted nondisplaced intertrochanteric left proximal femur fracture. Electronically Signed   By: Ilona Sorrel M.D.   On: 08/03/2015 17:23   Dg Femur Min 2 Views Left  08/04/2015  CLINICAL DATA:  ORIF left hip . EXAM: LEFT FEMUR 2 VIEWS; DG C-ARM 61-120 MIN COMPARISON:  08/03/2015. FINDINGS: ORIF left hip. Hardware intact. Near anatomic alignment.Three images. 0 minutes 39 seconds fluoroscopy time. IMPRESSION: ORIF left hip. Electronically Signed   By: Marcello Moores  Register   On:  08/04/2015 16:55   Dg Femur Port Min 2 Views Left  08/04/2015  CLINICAL DATA:  ORIF of femur fracture. EXAM: LEFT FEMUR PORTABLE 2 VIEWS COMPARISON:  Operative images same day. FINDINGS: Gamma nail fixation of a low intertrochanteric fracture of the left femur. Components appear well positioned. Alignment appears anatomic. The distal aspect of the nail is very close to the anterior cortex of the distal femur but I do not see a fracture. No distal locking screws. IMPRESSION: Gamma nail ORIF of low intertrochanteric fracture. Electronically Signed   By: Nelson Chimes M.D.   On: 08/04/2015 18:42    Microbiology: Recent Results (from the past 240 hour(s))  MRSA PCR Screening     Status: None   Collection Time: 08/04/15  1:57 PM  Result Value Ref Range Status   MRSA by PCR NEGATIVE NEGATIVE Final    Comment:        The GeneXpert MRSA Assay (FDA approved for NASAL specimens only), is one component of a comprehensive MRSA colonization surveillance program. It is not intended to diagnose MRSA infection nor to guide or monitor treatment for MRSA infections.      Labs: Basic Metabolic Panel:  Recent Labs Lab 08/03/15 1645 08/07/15 0835  NA 141 137  K 3.8 4.6  CL 103 94*  CO2 30 35*  GLUCOSE 95 126*  BUN 9 13  CREATININE 0.52 0.51  CALCIUM 9.0 9.6   Liver Function Tests: No results for input(s): AST, ALT, ALKPHOS, BILITOT, PROT, ALBUMIN in the last 168 hours. No results for input(s): LIPASE, AMYLASE in the last 168 hours. No results for input(s): AMMONIA in the last 168 hours. CBC:  Recent Labs Lab 08/03/15 1645 08/05/15 1237 08/06/15 0600 08/07/15 0835 08/08/15 0835  WBC 11.9*  --  15.3* 16.6*  --   HGB 10.5* 7.9* 6.6* 8.7* 8.2*  HCT 29.9* 22.2* 18.1* 24.1* 23.4*  MCV 111.2*  --  119.9* 99.2  --   PLT 443*  --  345 313  --    Cardiac Enzymes: No results for input(s): CKTOTAL, CKMB, CKMBINDEX, TROPONINI in the last 168 hours. BNP: BNP (last 3 results) No results  for input(s): BNP in the last 8760 hours.  ProBNP (last 3 results) No results for input(s): PROBNP in the last 8760 hours.  CBG: No results for input(s): GLUCAP in the last 168 hours.     SignedDelfina Redwood  [facility] 08/08/2015, 11:03 AM

## 2015-08-08 NOTE — Clinical Social Work Note (Signed)
Patient to be d/c'ed today to [facility].  Patient and family agreeable to plans will transport via ems RN to call report.  Sonoma Firkus, MSW, LCSWA [phone]  

## 2015-08-10 LAB — TYPE AND SCREEN
ABO/RH(D): B POS
ANTIBODY SCREEN: NEGATIVE
DAT, IgG: POSITIVE
UNIT DIVISION: 0
Unit division: 0

## 2015-08-12 ENCOUNTER — Other Ambulatory Visit: Payer: Self-pay | Admitting: Oncology

## 2015-08-14 ENCOUNTER — Telehealth: Payer: Self-pay | Admitting: Oncology

## 2015-08-14 NOTE — Telephone Encounter (Signed)
Memory was full on voicemail mailed a letter and a new calendar per dr Edwyna Shell

## 2015-08-21 NOTE — Anesthesia Postprocedure Evaluation (Signed)
Anesthesia Post Note  Patient: [name]  Procedure(s) Performed: Procedure(s) (LRB): INTRAMEDULLARY (IM) NAIL FEMORAL (Left)  Patient location during evaluation: PACU Anesthesia Type: General Level of consciousness: awake and alert Pain management: satisfactory to patient Vital Signs Assessment: vitals unstable Respiratory status: spontaneous breathing Cardiovascular status: stable Anesthetic complications: no    Last Vitals:  Filed Vitals:   08/08/15 0522 08/08/15 1700  BP: 103/41 113/42  Pulse: 73 79  Temp: 37.2 C 36.9 C  Resp: 17 16    Last Pain:  Filed Vitals:   08/08/15 1835  PainSc: 0-No pain                 Aralyn Nowak,JAMES TERRILL

## 2015-08-28 ENCOUNTER — Other Ambulatory Visit (HOSPITAL_BASED_OUTPATIENT_CLINIC_OR_DEPARTMENT_OTHER): Payer: Medicare Other

## 2015-08-28 ENCOUNTER — Other Ambulatory Visit: Payer: Self-pay | Admitting: Oncology

## 2015-08-28 ENCOUNTER — Encounter: Payer: Self-pay | Admitting: Oncology

## 2015-08-28 ENCOUNTER — Other Ambulatory Visit: Payer: Medicare Other

## 2015-08-28 ENCOUNTER — Telehealth: Payer: Self-pay | Admitting: Oncology

## 2015-08-28 ENCOUNTER — Ambulatory Visit: Payer: Medicare Other | Admitting: Oncology

## 2015-08-28 ENCOUNTER — Ambulatory Visit (HOSPITAL_BASED_OUTPATIENT_CLINIC_OR_DEPARTMENT_OTHER): Payer: Medicare Other | Admitting: Oncology

## 2015-08-28 ENCOUNTER — Other Ambulatory Visit (HOSPITAL_BASED_OUTPATIENT_CLINIC_OR_DEPARTMENT_OTHER): Payer: Medicare Other | Admitting: *Deleted

## 2015-08-28 VITALS — BP 115/33 | HR 81 | Temp 98.9°F | Resp 18 | Ht 60.0 in | Wt 93.5 lb

## 2015-08-28 DIAGNOSIS — Z720 Tobacco use: Secondary | ICD-10-CM | POA: Diagnosis not present

## 2015-08-28 DIAGNOSIS — Z1239 Encounter for other screening for malignant neoplasm of breast: Secondary | ICD-10-CM

## 2015-08-28 DIAGNOSIS — R922 Inconclusive mammogram: Secondary | ICD-10-CM

## 2015-08-28 DIAGNOSIS — D591 Autoimmune hemolytic anemia, unspecified: Secondary | ICD-10-CM

## 2015-08-28 DIAGNOSIS — D5912 Cold autoimmune hemolytic anemia: Secondary | ICD-10-CM

## 2015-08-28 DIAGNOSIS — E538 Deficiency of other specified B group vitamins: Secondary | ICD-10-CM

## 2015-08-28 LAB — CBC WITH DIFFERENTIAL/PLATELET
BASO%: 0.2 % (ref 0.0–2.0)
BASOS ABS: 0 10*3/uL (ref 0.0–0.1)
EOS%: 0.5 % (ref 0.0–7.0)
Eosinophils Absolute: 0.1 10*3/uL (ref 0.0–0.5)
HEMATOCRIT: 19.3 % — AB (ref 34.8–46.6)
HEMOGLOBIN: 6.4 g/dL — AB (ref 11.6–15.9)
LYMPH#: 5.5 10*3/uL — AB (ref 0.9–3.3)
LYMPH%: 36.6 % (ref 14.0–49.7)
MCH: 38.3 pg — AB (ref 25.1–34.0)
MCHC: 33.2 g/dL (ref 31.5–36.0)
MCV: 115.6 fL — AB (ref 79.5–101.0)
MONO#: 1.2 10*3/uL — AB (ref 0.1–0.9)
MONO%: 8.1 % (ref 0.0–14.0)
NEUT#: 8.3 10*3/uL — ABNORMAL HIGH (ref 1.5–6.5)
NEUT%: 54.6 % (ref 38.4–76.8)
NRBC: 0 % (ref 0–0)
Platelets: 599 10*3/uL — ABNORMAL HIGH (ref 145–400)
RBC: 1.67 10*6/uL — ABNORMAL LOW (ref 3.70–5.45)
RDW: 24.3 % — ABNORMAL HIGH (ref 11.2–14.5)
WBC: 15.1 10*3/uL — ABNORMAL HIGH (ref 3.9–10.3)

## 2015-08-28 LAB — COMPREHENSIVE METABOLIC PANEL
ALBUMIN: 3.5 g/dL (ref 3.5–5.0)
ALK PHOS: 174 U/L — AB (ref 40–150)
ALT: <9 U/L (ref 0–55)
AST: 14 U/L (ref 5–34)
Anion Gap: 8 mEq/L (ref 3–11)
BILIRUBIN TOTAL: 2.45 mg/dL — AB (ref 0.20–1.20)
BUN: 14.7 mg/dL (ref 7.0–26.0)
CALCIUM: 8.7 mg/dL (ref 8.4–10.4)
CO2: 33 mEq/L — ABNORMAL HIGH (ref 22–29)
Chloride: 100 mEq/L (ref 98–109)
Creatinine: 0.7 mg/dL (ref 0.6–1.1)
EGFR: 81 mL/min/{1.73_m2} — ABNORMAL LOW (ref >90)
GLUCOSE: 93 mg/dL (ref 70–140)
Potassium: 3.7 mEq/L (ref 3.5–5.1)
SODIUM: 140 meq/L (ref 136–145)
TOTAL PROTEIN: 6.1 g/dL — AB (ref 6.4–8.3)

## 2015-08-28 LAB — RETICULOCYTES (CHCC)
Immature Retic Fract: 19.1 % — ABNORMAL HIGH (ref 1.60–10.00)
RBC: 1.61 10*6/uL — ABNORMAL LOW (ref 3.70–5.45)
Retic %: 17.16 % — ABNORMAL HIGH (ref 0.70–2.10)
Retic Ct Abs: 276.28 10*3/uL — ABNORMAL HIGH (ref 33.70–90.70)

## 2015-08-28 LAB — TECHNOLOGIST REVIEW

## 2015-08-28 MED ORDER — FERROUS GLUCONATE 324 (38 FE) MG PO TABS: 324.0000 mg | ORAL_TABLET | Freq: Two times a day (BID) | ORAL | Status: DC

## 2015-08-28 MED ORDER — CYANOCOBALAMIN ER 1000 MCG PO TBCR: EXTENDED_RELEASE_TABLET | ORAL | Status: DC

## 2015-08-28 MED ORDER — FOLIC ACID 1 MG PO TABS: ORAL_TABLET | ORAL | Status: DC

## 2015-08-28 NOTE — Progress Notes (Signed)
OFFICE PROGRESS NOTE   08/29/2015   Physicians: M.Magod. No PCP per patient.  INTERVAL HISTORY:  Patient is seen for a six-month follow-up for cold agglutinin disease. Since her last visit with [place] she was admitted for surgery on her left hip due to a fracture. She went to outpatient rehabilitation was discharged last week. Of note, she required 1 unit of packed red blood cells during her hospitalization because her hemoglobin dropped to 6.6. She remains on chronic folic acid supplement due to hemolysis from the cold agglutinin. She had slightly low B12 in past, presently on B12 oral 1000 mg daily.  She denies increased fatigue or increased SOB with exertion. Has stopped smoking and is using nicotine patch. She denies new or different pain. Appetite and energy are at baseline.  She has had no bleeding  She does not have PAC She had flu vaccine  Review of systems as above, also: No recent fever or symptoms of infection. Denies increased cough or SOB. Denies new or different pain. No bleeding.  Remainder of 10 point Review of Systems negative.  Objective:  Vital signs in last 24 hours:  BP 115/33 mmHg  Pulse 81  Temp(Src) 98.9 F (37.2 C) (Oral)  Resp 18  Ht 5' (1.524 m)  Wt 93 lb 8 oz (42.411 kg)  BMI 18.26 kg/m2  SpO2 96% weight is stable  Alert, oriented and appropriate. Ambulatory without difficulty.   HEENT:PERRL, sclerae not icteric. Oral mucosa moist without lesions, posterior pharynx clear.  Neck supple. No JVD.  Lymphatics:no cervical,suraclavicular, axillary or inguinal adenopathy Resp: diminished BS thruout without wheeze or crackles. No dullness to percussion Cardio: regular rate and rhythm. No gallop. GI: abbdomen scaphoidm soft, nontender, not distended, no mass or organomegaly. Normally active bowel sounds.  Musculoskeletal/ Extremities: without pitting edema, cords, tenderness Neuro:  nonfocal PSYCH apprepriate mood and affect Skin without rash, ecchymosis,  petechiae   Lab Results:  Results for orders placed or performed in visit on 08/28/15  TECHNOLOGIST REVIEW  Result Value Ref Range   Technologist Review Few variant lymphs. mod poly   CBC with Differential/Platelet  Result Value Ref Range   WBC 15.1 (H) 3.9 - 10.3 10e3/uL   NEUT# 8.3 (H) 1.5 - 6.5 10e3/uL   HGB 6.4 (LL) 11.6 - 15.9 g/dL   HCT 19.3 (L) 34.8 - 46.6 %   Platelets 599 Large platelets present (H) 145 - 400 10e3/uL   MCV 115.6 (H) 79.5 - 101.0 fL   MCH 38.3 (H) 25.1 - 34.0 pg   MCHC 33.2 31.5 - 36.0 g/dL   RBC 1.67 (L) 3.70 - 5.45 10e6/uL   RDW 24.3 (H) 11.2 - 14.5 %   lymph# 5.5 (H) 0.9 - 3.3 10e3/uL   MONO# 1.2 (H) 0.1 - 0.9 10e3/uL   Eosinophils Absolute 0.1 0.0 - 0.5 10e3/uL   Basophils Absolute 0.0 0.0 - 0.1 10e3/uL   NEUT% 54.6 38.4 - 76.8 %   LYMPH% 36.6 14.0 - 49.7 %   MONO% 8.1 0.0 - 14.0 %   EOS% 0.5 0.0 - 7.0 %   BASO% 0.2 0.0 - 2.0 %   nRBC 0 0 - 0 %  Comprehensive metabolic panel  Result Value Ref Range   Sodium 140 136 - 145 mEq/L   Potassium 3.7 3.5 - 5.1 mEq/L   Chloride 100 98 - 109 mEq/L   CO2 33 (H) 22 - 29 mEq/L   Glucose 93 70 - 140 mg/dl   BUN 14.7 7.0 - 26.0  mg/dL   Creatinine 0.7 0.6 - 1.1 mg/dL   Total Bilirubin 2.45 (H) 0.20 - 1.20 mg/dL   Alkaline Phosphatase 174 (H) 40 - 150 U/L   AST 14 5 - 34 U/L   ALT <9 0 - 55 U/L   Total Protein 6.1 (L) 6.4 - 8.3 g/dL   Albumin 3.5 3.5 - 5.0 g/dL   Calcium 8.7 8.4 - 10.4 mg/dL   Anion Gap 8 3 - 11 mEq/L   EGFR 81 (L) >90 ml/min/1.73 m2    IFE 01-2014 by Dr Juliann Mule had monoclonal protein 0.62 g/dl.   Studies/Results:  Last [facility]ograms at [facility] 09-21-14 extremely dense breast tissue. Will schedule mammograms now, recommended tomo/ 3D due to density of tissue  Medications: I have reviewed the patient's current medications. Continue folate and B12. Add iron.   DISCUSSION: Patient's hemoglobin is low today but she is asymptomatic. Given that she has cold agglutinin, we are unsure if  this lab result is accurate. Since she is asymptomatic we will hold off on transfusing at this time. Discussed continuation of folic acid and vitamin B12 and add ferrous gluconate. Prescriptions were sent to her local pharmacy. We will plan to recheck her CBC in approximately one month. We discussed that should call our office if she develops increased fatigue, dyspnea on exertion, or shortness of breath.  I have encouraged her to establish with PCP. She will call offices to see which are taking new medicare patients.   Assessment/Plan:  1.Cold agglutinin hemolytic anemia: stable. Continue cold avoidance and supplemental folate.  2.slightly low B12 with macrocytic indices: continue B12 supplement now oral.  3.long tobacco abuse: she is finally addressing this. Discussed. 4.hx diverticulitis, not symptomatic 5.dense breast tissue on most recent mammograms: 3D/ tomo mammography would be appropriate for next imaging 6.low monoclonal spike on IFE, follow 7.flu vaccine done 8.needs PCP  Repeat lab in 1 month. Return visit with labs 6 mo. Patient understood discussion, recommendations and plans and was in agreement.  Plan reviewed with Dr. Marko Plume.  Mikey Bussing, NP   08/29/2015, 9:21 AM

## 2015-08-28 NOTE — Telephone Encounter (Signed)
per pof to sch pt appt-gave pt copy of avs °

## 2015-08-29 DIAGNOSIS — Z1239 Encounter for other screening for malignant neoplasm of breast: Secondary | ICD-10-CM | POA: Insufficient documentation

## 2015-08-30 ENCOUNTER — Other Ambulatory Visit: Payer: Self-pay | Admitting: Oncology

## 2015-08-30 DIAGNOSIS — D5912 Cold autoimmune hemolytic anemia: Secondary | ICD-10-CM

## 2015-08-30 DIAGNOSIS — D591 Other autoimmune hemolytic anemias: Principal | ICD-10-CM

## 2015-09-28 ENCOUNTER — Telehealth: Payer: Self-pay

## 2015-09-28 ENCOUNTER — Telehealth: Payer: Self-pay | Admitting: Oncology

## 2015-09-28 ENCOUNTER — Other Ambulatory Visit (HOSPITAL_BASED_OUTPATIENT_CLINIC_OR_DEPARTMENT_OTHER): Payer: Medicare Other

## 2015-09-28 DIAGNOSIS — D591 Autoimmune hemolytic anemia, unspecified: Secondary | ICD-10-CM

## 2015-09-28 DIAGNOSIS — D5912 Cold autoimmune hemolytic anemia: Secondary | ICD-10-CM

## 2015-09-28 LAB — CBC & DIFF AND RETIC
BASO%: 0.4 % (ref 0.0–2.0)
Basophils Absolute: 0.1 10*3/uL (ref 0.0–0.1)
EOS%: 1 % (ref 0.0–7.0)
Eosinophils Absolute: 0.1 10*3/uL (ref 0.0–0.5)
HEMATOCRIT: 19.8 % — AB (ref 34.8–46.6)
HGB: 6.5 g/dL — CL (ref 11.6–15.9)
Immature Retic Fract: 17.4 % — ABNORMAL HIGH (ref 1.60–10.00)
LYMPH#: 5.7 10*3/uL — AB (ref 0.9–3.3)
LYMPH%: 41.7 % (ref 14.0–49.7)
MCH: 37.4 pg — AB (ref 25.1–34.0)
MCHC: 32.8 g/dL (ref 31.5–36.0)
MCV: 113.8 fL — ABNORMAL HIGH (ref 79.5–101.0)
MONO#: 1 10*3/uL — ABNORMAL HIGH (ref 0.1–0.9)
MONO%: 7.3 % (ref 0.0–14.0)
NEUT#: 6.8 10*3/uL — ABNORMAL HIGH (ref 1.5–6.5)
NEUT%: 49.6 % (ref 38.4–76.8)
NRBC: 0 % (ref 0–0)
Platelets: 534 10*3/uL — ABNORMAL HIGH (ref 145–400)
RBC: 1.74 10*6/uL — ABNORMAL LOW (ref 3.70–5.45)
RDW: 15.5 % — AB (ref 11.2–14.5)
RETIC %: 6.4 % — AB (ref 0.70–2.10)
Retic Ct Abs: 111.36 10*3/uL — ABNORMAL HIGH (ref 33.70–90.70)
WBC: 13.7 10*3/uL — ABNORMAL HIGH (ref 3.9–10.3)

## 2015-09-28 LAB — IRON AND TIBC
%SAT: 92 % — AB (ref 21–57)
Iron: 214 ug/dL — ABNORMAL HIGH (ref 41–142)
TIBC: 233 ug/dL — ABNORMAL LOW (ref 236–444)
UIBC: 19 ug/dL — ABNORMAL LOW (ref 120–384)

## 2015-09-28 LAB — TECHNOLOGIST REVIEW

## 2015-09-28 LAB — FERRITIN: FERRITIN: 608 ng/mL — AB (ref 9–269)

## 2015-09-28 NOTE — Telephone Encounter (Signed)
pt r/s lab until today.

## 2015-09-28 NOTE — Telephone Encounter (Signed)
Pt had cbc drawn and waited for results. Hgb is 6.5. The pt denies SOB, fatigue, palpitations, chest pain. S/w Selena Lesser NP and read last OV note. Pt released to go home. Instructed to go to ER if she gets symptomatic. Pt has cold agglutinin disease. Pt asking if she needs to continue iron - iron studies pending. Pt asking when next lab draw should be - will address when iron studies done.  She is currently staying at her mother's house. Phone number is [phone]

## 2015-10-02 ENCOUNTER — Telehealth: Payer: Self-pay

## 2015-10-02 NOTE — Telephone Encounter (Signed)
S/w Mikey Bussing NP and than lvm to continue iron until Dr Marko Plume back in office 1/16 and has a chance to look at your iron panel. 1) continue iron?  2) when next lab draw?

## 2015-10-04 ENCOUNTER — Ambulatory Visit
Admission: RE | Admit: 2015-10-04 | Discharge: 2015-10-04 | Disposition: A | Discharge status: Home / Self Care | Payer: Medicare Other | Source: Ambulatory Visit | Attending: Oncology | Admitting: Oncology

## 2015-10-04 DIAGNOSIS — Z1239 Encounter for other screening for malignant neoplasm of breast: Secondary | ICD-10-CM

## 2015-10-05 ENCOUNTER — Other Ambulatory Visit: Payer: Self-pay | Admitting: Oncology

## 2015-10-05 ENCOUNTER — Ambulatory Visit
Admission: RE | Admit: 2015-10-05 | Discharge: 2015-10-05 | Disposition: A | Discharge status: Home / Self Care | Payer: Medicare Other | Source: Ambulatory Visit | Attending: Oncology | Admitting: Oncology

## 2015-10-05 DIAGNOSIS — R928 Other abnormal and inconclusive findings on diagnostic imaging of breast: Secondary | ICD-10-CM

## 2015-10-06 ENCOUNTER — Other Ambulatory Visit: Payer: Medicare Other

## 2015-10-06 ENCOUNTER — Other Ambulatory Visit: Payer: Self-pay | Admitting: Oncology

## 2015-10-06 DIAGNOSIS — E538 Deficiency of other specified B group vitamins: Secondary | ICD-10-CM

## 2015-10-06 DIAGNOSIS — D591 Other autoimmune hemolytic anemias: Secondary | ICD-10-CM

## 2015-10-06 DIAGNOSIS — D649 Anemia, unspecified: Secondary | ICD-10-CM

## 2015-10-06 DIAGNOSIS — D5912 Cold autoimmune hemolytic anemia: Secondary | ICD-10-CM

## 2015-10-06 NOTE — Telephone Encounter (Signed)
Patient Demographics     Patient Name Sex DOB SSN Address Phone    [name], [name] Female Feb 09, 1941 [id number] [address] [place] [phone] (Home) *Preferred* [id number] (Work)      Message  Received: Today    Gordy Levan, MD  Baruch Merl, RN            Ok to stop iron   She will need to pick up 24 hr urine collection container to do this prior to LL apt in Feb.   (message #2 on this one)

## 2015-10-06 NOTE — Telephone Encounter (Signed)
LM at mother's number where she is currently staying. Phone number [phone]. Stated stopping iron and seeing Dr. Marko Plume in mid to late Feb. with lab prior, especially 24 hour urine collection.

## 2015-10-06 NOTE — Telephone Encounter (Signed)
FYI POF done

## 2015-10-08 ENCOUNTER — Telehealth: Payer: Self-pay | Admitting: Oncology

## 2015-10-08 NOTE — Telephone Encounter (Signed)
Called and left a message with all appointments and to get a 24hr urine a few days prior

## 2015-10-09 ENCOUNTER — Other Ambulatory Visit: Payer: Medicare Other

## 2015-10-11 ENCOUNTER — Telehealth: Payer: Self-pay

## 2015-10-11 ENCOUNTER — Other Ambulatory Visit: Payer: Self-pay

## 2015-10-11 NOTE — Telephone Encounter (Signed)
-----   Message from Gordy Levan, MD sent at 10/06/2015  1:12 PM EST -----  Ok to stop iron  She will need to pick up 24 hr urine collection container to do this prior to LL apt in Feb.  (message #2 on this one)

## 2015-10-11 NOTE — Telephone Encounter (Signed)
Told Ms. [name] that she needs to come to [facility]  to pick up a jug to collect  Urine for 24 hrs and then return it and have lab work done. Dr. Marko Plume would have the lab work back for her appointment on 11-02-15 if she has the lab work and 24 our urine done by ~ 10-24-15.  The results for some of the labs she is requesting can take ~ 7 days to come back.  Ms. [name] verbalized understanding. She does not need a lab appointment to pick up the urine jug. Told her that when she comes to pick up the jug is needs to schedule a lab appointment for returning the jug and lab work. Ms. [name] verbalized understanding.

## 2015-10-18 ENCOUNTER — Other Ambulatory Visit (HOSPITAL_COMMUNITY): Payer: Self-pay | Admitting: Internal Medicine

## 2015-10-18 DIAGNOSIS — R0989 Other specified symptoms and signs involving the circulatory and respiratory systems: Secondary | ICD-10-CM

## 2015-10-19 ENCOUNTER — Ambulatory Visit (HOSPITAL_COMMUNITY)
Admission: RE | Admit: 2015-10-19 | Discharge: 2015-10-19 | Disposition: A | Discharge status: Home / Self Care | Payer: Medicare Other | Source: Ambulatory Visit | Attending: Internal Medicine | Admitting: Internal Medicine

## 2015-10-19 ENCOUNTER — Other Ambulatory Visit: Payer: Self-pay | Admitting: Oncology

## 2015-10-19 DIAGNOSIS — I6523 Occlusion and stenosis of bilateral carotid arteries: Secondary | ICD-10-CM | POA: Diagnosis not present

## 2015-10-19 DIAGNOSIS — R0989 Other specified symptoms and signs involving the circulatory and respiratory systems: Secondary | ICD-10-CM | POA: Diagnosis present

## 2015-10-19 NOTE — Progress Notes (Signed)
VASCULAR LAB PRELIMINARY  PRELIMINARY  PRELIMINARY  PRELIMINARY  Carotid duplex completed.    Preliminary report:  1-39% Right ICA stenosis, highest end of scale.  40-59% ICA stenosis, highest end of scale.  Vertebral artery flow is antegrade.   Danijela Vessey, RVT 10/19/2015, 11:26 AM

## 2015-10-24 NOTE — Telephone Encounter (Signed)
Ms. Steen' appointment with Dr. Marko Plume was R/S to 11-09-15. Ms. Grizzel set up to return  24 hr urine and have labs drawn on 11-02-15 at 1045.  Ms. Niezgoda verbalized understanding.

## 2015-10-31 ENCOUNTER — Other Ambulatory Visit: Payer: Self-pay | Admitting: Oncology

## 2015-10-31 DIAGNOSIS — D5912 Cold autoimmune hemolytic anemia: Secondary | ICD-10-CM

## 2015-10-31 DIAGNOSIS — D591 Other autoimmune hemolytic anemias: Principal | ICD-10-CM

## 2015-11-02 ENCOUNTER — Other Ambulatory Visit (HOSPITAL_BASED_OUTPATIENT_CLINIC_OR_DEPARTMENT_OTHER): Payer: Medicare Other

## 2015-11-02 ENCOUNTER — Ambulatory Visit: Payer: Medicare Other | Admitting: Oncology

## 2015-11-02 ENCOUNTER — Telehealth: Payer: Self-pay

## 2015-11-02 ENCOUNTER — Other Ambulatory Visit: Payer: Medicare Other

## 2015-11-02 ENCOUNTER — Telehealth: Payer: Self-pay | Admitting: *Deleted

## 2015-11-02 DIAGNOSIS — D649 Anemia, unspecified: Secondary | ICD-10-CM | POA: Diagnosis not present

## 2015-11-02 DIAGNOSIS — D5912 Cold autoimmune hemolytic anemia: Secondary | ICD-10-CM

## 2015-11-02 DIAGNOSIS — E538 Deficiency of other specified B group vitamins: Secondary | ICD-10-CM

## 2015-11-02 DIAGNOSIS — D591 Other autoimmune hemolytic anemias: Secondary | ICD-10-CM

## 2015-11-02 LAB — TECHNOLOGIST REVIEW

## 2015-11-02 LAB — CBC WITH DIFFERENTIAL/PLATELET
BASO%: 0.5 % (ref 0.0–2.0)
Basophils Absolute: 0.1 10*3/uL (ref 0.0–0.1)
EOS ABS: 0.2 10*3/uL (ref 0.0–0.5)
EOS%: 1.4 % (ref 0.0–7.0)
HEMATOCRIT: 17.9 % — AB (ref 34.8–46.6)
HEMOGLOBIN: 6 g/dL — AB (ref 11.6–15.9)
LYMPH#: 4.9 10*3/uL — AB (ref 0.9–3.3)
LYMPH%: 38.8 % (ref 14.0–49.7)
MCH: 37 pg — AB (ref 25.1–34.0)
MCHC: 33.5 g/dL (ref 31.5–36.0)
MCV: 110.5 fL — ABNORMAL HIGH (ref 79.5–101.0)
MONO#: 1 10*3/uL — AB (ref 0.1–0.9)
MONO%: 8 % (ref 0.0–14.0)
NEUT%: 51.3 % (ref 38.4–76.8)
NEUTROS ABS: 6.5 10*3/uL (ref 1.5–6.5)
PLATELETS: 544 10*3/uL — AB (ref 145–400)
RBC: 1.62 10*6/uL — ABNORMAL LOW (ref 3.70–5.45)
RDW: 16.3 % — ABNORMAL HIGH (ref 11.2–14.5)
WBC: 12.6 10*3/uL — ABNORMAL HIGH (ref 3.9–10.3)
nRBC: 0 % (ref 0–0)

## 2015-11-02 NOTE — Telephone Encounter (Signed)
S/w pt and she is asymptomatic for SOB, tiredness, weakness. Explained her hgb today was 6.0. S/w Dr Edwyna Shell and we will see pt next week at scheduled appt.

## 2015-11-02 NOTE — Telephone Encounter (Signed)
[facility] Called for refill of iron.  This nurse authorized one refill under Dr; Marko Plume.

## 2015-11-03 LAB — IMMUNOFIXATION ELECTROPHORESIS
IgA, Qn, Serum: 151 mg/dL (ref 64–422)
IgG, Qn, Serum: 399 mg/dL — ABNORMAL LOW (ref 700–1600)
Total Protein: 6.6 g/dL (ref 6.0–8.5)

## 2015-11-03 LAB — UIFE/LIGHT CHAINS/TP QN, 24-HR UR
FR KAPPA LT CH,24HR: 45 mg/(24.h)
FR LAMBDA LT CH,24HR: 8 mg/24 hr
FREE LAMBDA LT CHAINS, UR: 5.24 mg/L (ref 0.24–6.66)
Free Kappa Lt Chains,Ur: 31 mg/L — ABNORMAL HIGH (ref 1.35–24.19)
KAPPA/LAMBDA RATIO, U: 5.92 (ref 2.04–10.37)
PROTEIN,TOTAL,URINE: 17 mg/dL
Prot,24hr calculated: 246.5 mg/24 hr — ABNORMAL HIGH (ref 30.0–150.0)

## 2015-11-03 LAB — VITAMIN B12: Vitamin B12: 438 pg/mL (ref 211–946)

## 2015-11-03 LAB — ERYTHROPOIETIN: Erythropoietin: 179.5 m[IU]/mL — ABNORMAL HIGH (ref 2.6–18.5)

## 2015-11-08 ENCOUNTER — Other Ambulatory Visit: Payer: Self-pay | Admitting: Oncology

## 2015-11-09 ENCOUNTER — Other Ambulatory Visit (HOSPITAL_BASED_OUTPATIENT_CLINIC_OR_DEPARTMENT_OTHER): Payer: Medicare Other

## 2015-11-09 ENCOUNTER — Encounter: Payer: Self-pay | Admitting: *Deleted

## 2015-11-09 ENCOUNTER — Other Ambulatory Visit: Payer: Self-pay | Admitting: Oncology

## 2015-11-09 ENCOUNTER — Telehealth: Payer: Self-pay | Admitting: Oncology

## 2015-11-09 ENCOUNTER — Encounter: Payer: Self-pay | Admitting: Oncology

## 2015-11-09 ENCOUNTER — Ambulatory Visit (HOSPITAL_BASED_OUTPATIENT_CLINIC_OR_DEPARTMENT_OTHER): Payer: Medicare Other | Admitting: Oncology

## 2015-11-09 VITALS — BP 133/47 | HR 77 | Temp 98.1°F | Resp 18 | Ht 60.0 in | Wt 94.5 lb

## 2015-11-09 DIAGNOSIS — D591 Autoimmune hemolytic anemia, unspecified: Secondary | ICD-10-CM

## 2015-11-09 DIAGNOSIS — D5912 Cold autoimmune hemolytic anemia: Secondary | ICD-10-CM

## 2015-11-09 LAB — CBC WITH DIFFERENTIAL/PLATELET
BASO%: 1.4 % (ref 0.0–2.0)
BASOS ABS: 0.2 10*3/uL — AB (ref 0.0–0.1)
EOS%: 1.1 % (ref 0.0–7.0)
Eosinophils Absolute: 0.2 10*3/uL (ref 0.0–0.5)
HEMATOCRIT: 18.1 % — AB (ref 34.8–46.6)
HGB: 6.4 g/dL — CL (ref 11.6–15.9)
LYMPH#: 4.4 10*3/uL — AB (ref 0.9–3.3)
LYMPH%: 32.3 % (ref 14.0–49.7)
MCH: 41.1 pg — AB (ref 25.1–34.0)
MCHC: 35.3 g/dL (ref 31.5–36.0)
MCV: 116.6 fL — ABNORMAL HIGH (ref 79.5–101.0)
MONO#: 1.1 10*3/uL — AB (ref 0.1–0.9)
MONO%: 7.9 % (ref 0.0–14.0)
NEUT#: 7.9 10*3/uL — ABNORMAL HIGH (ref 1.5–6.5)
NEUT%: 57.3 % (ref 38.4–76.8)
PLATELETS: 533 10*3/uL — AB (ref 145–400)
RBC: 1.55 10*6/uL — AB (ref 3.70–5.45)
RDW: 15.1 % — ABNORMAL HIGH (ref 11.2–14.5)
WBC: 13.8 10*3/uL — ABNORMAL HIGH (ref 3.9–10.3)

## 2015-11-09 NOTE — Progress Notes (Signed)
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 With lower hemoglobin reported on labs since surgery for hip fracture in 07-2015, additional anemia studies have been repeated, without new findings other than the cold agglutinin.  She has follow up for BP with new PCP Dr Mancel Bale next week.  Patient fell in yard in 07-2015, with fracture of hip, surgery by Dr Alain Marion. She has recovered from the hip surgery, with inpatient PT at SNF then additional outpatient PT thru 2 weeks ago. She is staying at [facility] home, which is more easily accessible. She was transfused PRBCs 08-06-15 post op, this the first transfusion that she has had and did not notice obvious improvement symptomatically then. She continues folate daily and avoids cold as possible. She denies increased SOB, chest pain, worsening of exercise tolerance. She did stop smoking with the hip fracture. Otherwise appetite is at baseline, no bleeding. No abdominal pain, no fever or symptoms of infection, no LE swelling. Some mild soreness LUQ since hip fracture. No discomfort left femur from rod. Remainder of 10 point Review of Systems negative/ unchanged  No PAC ful vaccine 07-24-15   ONCOLOGIC HISTORY Idiopathic cold agglutinin disease. Slightly low B12 06-2012, on supplemental B12   Objective:  Vital signs in last 24 hours:  BP 133/47 mmHg  Pulse 77  Temp(Src) 98.1 F (36.7 C) (Oral)  Resp 18  Ht 5' (1.524 m)  Wt 94 lb 8 oz (42.865 kg)  BMI 18.46 kg/m2  SpO2 97% Weight up 1 lb Alert, oriented and appropriate. Ambulatory without assistance.  Respirations not labored RA. Looks comfortable.  Sister very supportive   HEENT:PERRL, sclerae not icteric. Oral mucosa moist without lesions, posterior pharynx clear. Mucous membranes do not look as pale as hemoglobin  level. Neck supple. No JVD.  Lymphatics:no cervical,supraclavicular adenopathy Resp: somewhat diminished BS thruout otherwise clear to auscultation bilaterally and normal percussion bilaterally Cardio: regular rate and rhythm. No gallop. GI: soft, nontender, not distended, no mass or organomegaly. Normally active bowel sounds.  Musculoskeletal/ Extremities: without pitting edema, cords, tenderness Neuro: nonfocal. PSYCH appropriate mood and affect Skin without rash, ecchymosis, petechiae. Nailbeds not as pale as lab hgb value suggests  Lab Results:  Results for orders placed or performed in visit on 11/09/15  CBC with Differential/Platelet  Result Value Ref Range   WBC 13.8 (H) 3.9 - 10.3 10e3/uL   NEUT# 7.9 (H) 1.5 - 6.5 10e3/uL   HGB 6.4 (LL) 11.6 - 15.9 g/dL   HCT 18.1 (L) 34.8 - 46.6 %   Platelets 533 (H) 145 - 400 10e3/uL   MCV 116.6 (H) 79.5 - 101.0 fL   MCH 41.1 (H) 25.1 - 34.0 pg   MCHC 35.3 31.5 - 36.0 g/dL   RBC 1.55 (L) 3.70 - 5.45 10e6/uL   RDW 15.1 (H) 11.2 - 14.5 %   lymph# 4.4 (H) 0.9 - 3.3 10e3/uL   MONO# 1.1 (H) 0.1 - 0.9 10e3/uL   Eosinophils Absolute 0.2 0.0 - 0.5 10e3/uL   Basophils Absolute 0.2 (H) 0.0 - 0.1 10e3/uL   NEUT% 57.3 38.4 - 76.8 %   LYMPH% 32.3 14.0 - 49.7 %   MONO% 7.9 0.0 - 14.0 %   EOS% 1.1 0.0 - 7.0 %   BASO% 1.4 0.0 - 2.0 %   24 hour urine total protein 246 mg with  free kappa 31 mg/l B12 438 Erythropoietin  179 IFE monoclonal IgM with lambda light chain 1114 mg/dl Serum iron 214 with %sat 92  Studies/Results:  No results found.  Medications: I have reviewed the patient's current medications. Continue folate, doubt any benefit from doubling folate since already on 1 mg daily  DISCUSSION  Offered treatment of the cold agglutinin with Rituxan. She does not feel she is symptomatic and prefers not to have anything else done now, particularly given recent hip fracture.  Assessment/Plan: 1.cold agglutinin hemolytic anemia: careful  technique with blood draw at [facility] lab today, only minimally higher than last readings tho patient clinically does not appear that anemic. 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 Has recovered well. 5.flu vaccine done 6.dense breast tissue: best to get tomo mammograms ongoing 7.history diverticular disease   All questions answered. I will see her back in summer to check hgb in warm weather, or sooner if needed. TIme spent 20 min including >50% counseling and coordination of care. Cc Dr Mancel Bale.  Quinlin Conant P, MD   11/09/2015, 4:04 PM

## 2015-11-09 NOTE — Telephone Encounter (Signed)
Gave patient avs report and appointments for July. June lab/fu cxd due to 2/16 pof sent for July lab/fu.

## 2015-11-12 ENCOUNTER — Emergency Department (HOSPITAL_COMMUNITY): Payer: Medicare Other | Admitting: Certified Registered"

## 2015-11-12 ENCOUNTER — Encounter (HOSPITAL_COMMUNITY): Payer: Self-pay | Admitting: Emergency Medicine

## 2015-11-12 ENCOUNTER — Encounter (HOSPITAL_COMMUNITY)
Admission: EM | Disposition: A | Discharge status: Home / Self Care | Payer: Self-pay | Source: Home / Self Care | Attending: Emergency Medicine

## 2015-11-12 ENCOUNTER — Ambulatory Visit (HOSPITAL_COMMUNITY)
Admission: EM | Admit: 2015-11-12 | Discharge: 2015-11-13 | Disposition: A | Discharge status: Home / Self Care | Payer: Medicare Other | Attending: Urology | Admitting: Urology

## 2015-11-12 ENCOUNTER — Emergency Department (HOSPITAL_COMMUNITY): Payer: Medicare Other

## 2015-11-12 DIAGNOSIS — N132 Hydronephrosis with renal and ureteral calculous obstruction: Secondary | ICD-10-CM | POA: Diagnosis not present

## 2015-11-12 DIAGNOSIS — N201 Calculus of ureter: Secondary | ICD-10-CM | POA: Diagnosis present

## 2015-11-12 DIAGNOSIS — Z87891 Personal history of nicotine dependence: Secondary | ICD-10-CM | POA: Insufficient documentation

## 2015-11-12 DIAGNOSIS — Z841 Family history of disorders of kidney and ureter: Secondary | ICD-10-CM | POA: Insufficient documentation

## 2015-11-12 DIAGNOSIS — R109 Unspecified abdominal pain: Secondary | ICD-10-CM | POA: Diagnosis present

## 2015-11-12 HISTORY — PX: CYSTOSCOPY W/ URETERAL STENT PLACEMENT: SHX1429

## 2015-11-12 LAB — COMPREHENSIVE METABOLIC PANEL
ALK PHOS: 96 U/L (ref 38–126)
ALT: 7 U/L — AB (ref 14–54)
AST: 18 U/L (ref 15–41)
Albumin: 4.4 g/dL (ref 3.5–5.0)
Anion gap: 12 (ref 5–15)
BILIRUBIN TOTAL: 3.2 mg/dL — AB (ref 0.3–1.2)
BUN: 16 mg/dL (ref 6–20)
CALCIUM: 8.9 mg/dL (ref 8.9–10.3)
CO2: 28 mmol/L (ref 22–32)
CREATININE: 0.62 mg/dL (ref 0.44–1.00)
Chloride: 103 mmol/L (ref 101–111)
Glucose, Bld: 139 mg/dL — ABNORMAL HIGH (ref 65–99)
Potassium: 2.9 mmol/L — ABNORMAL LOW (ref 3.5–5.1)
Sodium: 143 mmol/L (ref 135–145)
Total Protein: 7 g/dL (ref 6.5–8.1)

## 2015-11-12 LAB — URINALYSIS, ROUTINE W REFLEX MICROSCOPIC
BILIRUBIN URINE: NEGATIVE
Glucose, UA: NEGATIVE mg/dL
Hgb urine dipstick: NEGATIVE
KETONES UR: NEGATIVE mg/dL
Leukocytes, UA: NEGATIVE
Nitrite: NEGATIVE
PH: 6 (ref 5.0–8.0)
PROTEIN: NEGATIVE mg/dL
Specific Gravity, Urine: 1.016 (ref 1.005–1.030)

## 2015-11-12 LAB — TYPE AND SCREEN
ABO/RH(D): B POS
ANTIBODY SCREEN: NEGATIVE
DAT, IgG: NEGATIVE

## 2015-11-12 LAB — CBC
HCT: 16.4 % — ABNORMAL LOW (ref 36.0–46.0)
Hemoglobin: 6.1 g/dL — CL (ref 12.0–15.0)
MCH: 46.9 pg — AB (ref 26.0–34.0)
MCHC: 37.2 g/dL — ABNORMAL HIGH (ref 30.0–36.0)
MCV: 126.2 fL — ABNORMAL HIGH (ref 78.0–100.0)
PLATELETS: 468 10*3/uL — AB (ref 150–400)
RBC: 1.3 MIL/uL — AB (ref 3.87–5.11)
WBC: 15.5 10*3/uL — ABNORMAL HIGH (ref 4.0–10.5)

## 2015-11-12 LAB — LIPASE, BLOOD: Lipase: 20 U/L (ref 11–51)

## 2015-11-12 SURGERY — CYSTOSCOPY, WITH RETROGRADE PYELOGRAM AND URETERAL STENT INSERTION
Anesthesia: General

## 2015-11-12 MED ORDER — NALOXONE HCL 0.4 MG/ML IJ SOLN: 0.4000 mg | Freq: Once | INTRAMUSCULAR | Status: DC

## 2015-11-12 MED ORDER — LIDOCAINE HCL (CARDIAC) 20 MG/ML IV SOLN
INTRAVENOUS | Status: AC
  Filled 2015-11-12: qty 5

## 2015-11-12 MED ORDER — ONDANSETRON HCL 4 MG/2ML IJ SOLN
INTRAMUSCULAR | Status: AC
  Filled 2015-11-12: qty 2

## 2015-11-12 MED ORDER — ACETAMINOPHEN 325 MG PO TABS: 650.0000 mg | ORAL_TABLET | Freq: Four times a day (QID) | ORAL | Status: DC | PRN

## 2015-11-12 MED ORDER — ONDANSETRON HCL 4 MG/2ML IJ SOLN
4.0000 mg | Freq: Once | INTRAMUSCULAR | Status: AC
  Administered 2015-11-12: 4 mg via INTRAVENOUS
  Filled 2015-11-12: qty 2

## 2015-11-12 MED ORDER — SUCCINYLCHOLINE CHLORIDE 20 MG/ML IJ SOLN
INTRAMUSCULAR | Status: DC | PRN
  Administered 2015-11-12: 70 mg via INTRAVENOUS

## 2015-11-12 MED ORDER — HYDROMORPHONE HCL 1 MG/ML IJ SOLN
0.5000 mg | Freq: Once | INTRAMUSCULAR | Status: AC
  Administered 2015-11-12: 0.5 mg via INTRAVENOUS
  Filled 2015-11-12: qty 1

## 2015-11-12 MED ORDER — POTASSIUM CHLORIDE 10 MEQ/100ML IV SOLN
10.0000 meq | INTRAVENOUS | Status: AC
  Administered 2015-11-12 (×2): 10 meq via INTRAVENOUS
  Filled 2015-11-12 (×2): qty 100

## 2015-11-12 MED ORDER — NALOXONE HCL 2 MG/2ML IJ SOSY
PREFILLED_SYRINGE | INTRAMUSCULAR | Status: AC
  Filled 2015-11-12: qty 2

## 2015-11-12 MED ORDER — SODIUM CHLORIDE 0.9 % IV SOLN
INTRAVENOUS | Status: DC
  Administered 2015-11-12: 17:00:00 via INTRAVENOUS

## 2015-11-12 MED ORDER — FENTANYL CITRATE (PF) 100 MCG/2ML IJ SOLN
INTRAMUSCULAR | Status: AC
  Filled 2015-11-12: qty 2

## 2015-11-12 MED ORDER — NICOTINE 21 MG/24HR TD PT24
21.0000 mg | MEDICATED_PATCH | Freq: Every day | TRANSDERMAL | Status: DC
  Administered 2015-11-13: 21 mg via TRANSDERMAL
  Filled 2015-11-12: qty 1

## 2015-11-12 MED ORDER — PROPOFOL 10 MG/ML IV BOLUS
INTRAVENOUS | Status: AC
  Filled 2015-11-12: qty 20

## 2015-11-12 MED ORDER — NALOXONE HCL 0.4 MG/ML IJ SOLN
0.2000 mg | Freq: Once | INTRAMUSCULAR | Status: AC
  Administered 2015-11-12: 0.2 mg via INTRAVENOUS

## 2015-11-12 MED ORDER — DOCUSATE SODIUM 50 MG PO CAPS
50.0000 mg | ORAL_CAPSULE | Freq: Every day | ORAL | Status: DC
  Administered 2015-11-13: 50 mg via ORAL
  Filled 2015-11-12: qty 1

## 2015-11-12 MED ORDER — LACTATED RINGERS IV SOLN
INTRAVENOUS | Status: DC | PRN
  Administered 2015-11-12: 22:00:00 via INTRAVENOUS

## 2015-11-12 MED ORDER — LIDOCAINE HCL (PF) 2 % IJ SOLN
INTRAMUSCULAR | Status: DC | PRN
  Administered 2015-11-12: 20 mg via INTRADERMAL

## 2015-11-12 MED ORDER — FENTANYL CITRATE (PF) 100 MCG/2ML IJ SOLN: 25.0000 ug | INTRAMUSCULAR | Status: DC | PRN

## 2015-11-12 MED ORDER — CEFAZOLIN SODIUM-DEXTROSE 2-3 GM-% IV SOLR
INTRAVENOUS | Status: AC
  Filled 2015-11-12: qty 50

## 2015-11-12 MED ORDER — IOHEXOL 300 MG/ML  SOLN
INTRAMUSCULAR | Status: DC | PRN
  Administered 2015-11-12: 50 mL via URETHRAL

## 2015-11-12 MED ORDER — NALOXONE HCL 0.4 MG/ML IJ SOLN
INTRAMUSCULAR | Status: AC
  Administered 2015-11-12: 0.2 mg via INTRAVENOUS
  Filled 2015-11-12: qty 1

## 2015-11-12 MED ORDER — KETOROLAC TROMETHAMINE 15 MG/ML IJ SOLN
7.5000 mg | Freq: Once | INTRAMUSCULAR | Status: DC
Start: 2015-11-12 — End: 2015-11-12

## 2015-11-12 MED ORDER — IOHEXOL 300 MG/ML  SOLN
25.0000 mL | Freq: Once | INTRAMUSCULAR | Status: AC | PRN
  Administered 2015-11-12: 50 mL via ORAL

## 2015-11-12 MED ORDER — IOHEXOL 300 MG/ML  SOLN
80.0000 mL | Freq: Once | INTRAMUSCULAR | Status: AC | PRN
  Administered 2015-11-12: 80 mL via INTRAVENOUS

## 2015-11-12 MED ORDER — ARTIFICIAL TEARS OP OINT
TOPICAL_OINTMENT | OPHTHALMIC | Status: AC
  Filled 2015-11-12: qty 3.5

## 2015-11-12 MED ORDER — CEFAZOLIN SODIUM 1-5 GM-% IV SOLN
1.0000 g | Freq: Three times a day (TID) | INTRAVENOUS | Status: DC
  Filled 2015-11-12 (×2): qty 50

## 2015-11-12 MED ORDER — OXYBUTYNIN CHLORIDE 5 MG PO TABS
5.0000 mg | ORAL_TABLET | Freq: Three times a day (TID) | ORAL | Status: DC | PRN
  Filled 2015-11-12: qty 1

## 2015-11-12 MED ORDER — KETOROLAC TROMETHAMINE 15 MG/ML IJ SOLN
15.0000 mg | Freq: Once | INTRAMUSCULAR | Status: AC
  Administered 2015-11-12: 15 mg via INTRAVENOUS
  Filled 2015-11-12: qty 1

## 2015-11-12 MED ORDER — KCL IN DEXTROSE-NACL 20-5-0.45 MEQ/L-%-% IV SOLN
INTRAVENOUS | Status: DC
  Administered 2015-11-13: 50 mL/h via INTRAVENOUS
  Filled 2015-11-12 (×2): qty 1000

## 2015-11-12 MED ORDER — PROPOFOL 10 MG/ML IV BOLUS
INTRAVENOUS | Status: DC | PRN
  Administered 2015-11-12: 80 mg via INTRAVENOUS

## 2015-11-12 MED ORDER — FENTANYL CITRATE (PF) 100 MCG/2ML IJ SOLN
INTRAMUSCULAR | Status: DC | PRN
Start: 2015-11-12 — End: 2015-11-12
  Administered 2015-11-12: 50 ug via INTRAVENOUS

## 2015-11-12 MED ORDER — ONDANSETRON HCL 4 MG/2ML IJ SOLN
INTRAMUSCULAR | Status: DC | PRN
  Administered 2015-11-12: 4 mg via INTRAVENOUS

## 2015-11-12 MED ORDER — HYDROMORPHONE HCL 1 MG/ML IJ SOLN
0.7500 mg | Freq: Once | INTRAMUSCULAR | Status: AC
  Administered 2015-11-12: 0.75 mg via INTRAVENOUS
  Filled 2015-11-12: qty 1

## 2015-11-12 MED ORDER — SODIUM CHLORIDE 0.9 % IR SOLN
Status: DC | PRN
  Administered 2015-11-12: 3000 mL via INTRAVESICAL

## 2015-11-12 MED ORDER — HYDROCODONE-ACETAMINOPHEN 5-325 MG PO TABS: 1.0000 | ORAL_TABLET | ORAL | Status: DC | PRN

## 2015-11-12 MED ORDER — LACTATED RINGERS IV SOLN: INTRAVENOUS | Status: DC

## 2015-11-12 MED ORDER — ONDANSETRON HCL 4 MG/2ML IJ SOLN: 4.0000 mg | INTRAMUSCULAR | Status: DC | PRN

## 2015-11-12 MED ORDER — CEFAZOLIN SODIUM-DEXTROSE 2-3 GM-% IV SOLR
INTRAVENOUS | Status: DC | PRN
  Administered 2015-11-12: 2 g via INTRAVENOUS

## 2015-11-12 SURGICAL SUPPLY — 19 items
BAG URO CATCHER STRL LF (MISCELLANEOUS) ×3 IMPLANT
CATH INTERMIT  6FR 70CM (CATHETERS) ×3 IMPLANT
CATH URET 5FR 28IN CONE TIP (BALLOONS)
CATH URET 5FR 70CM CONE TIP (BALLOONS) IMPLANT
CLOTH BEACON ORANGE TIMEOUT ST (SAFETY) ×3 IMPLANT
FIBER LASER FLEXIVA 1000 (UROLOGICAL SUPPLIES) IMPLANT
FIBER LASER FLEXIVA 200 (UROLOGICAL SUPPLIES) IMPLANT
FIBER LASER FLEXIVA 365 (UROLOGICAL SUPPLIES) IMPLANT
FIBER LASER FLEXIVA 550 (UROLOGICAL SUPPLIES) IMPLANT
FIBER LASER TRAC TIP (UROLOGICAL SUPPLIES) IMPLANT
GLOVE BIOGEL M STRL SZ7.5 (GLOVE) ×3 IMPLANT
GOWN STRL REUS W/TWL LRG LVL3 (GOWN DISPOSABLE) ×3 IMPLANT
GOWN STRL REUS W/TWL XL LVL3 (GOWN DISPOSABLE) ×3 IMPLANT
GUIDEWIRE STR DUAL SENSOR (WIRE) ×3 IMPLANT
MANIFOLD NEPTUNE II (INSTRUMENTS) ×3 IMPLANT
PACK CYSTO (CUSTOM PROCEDURE TRAY) ×3 IMPLANT
STENT URET 6FRX24 CONTOUR (STENTS) ×2 IMPLANT
TUBING CONNECTING 10 (TUBING) ×3 IMPLANT
WIRE COONS/BENSON .038X145CM (WIRE) ×3 IMPLANT

## 2015-11-12 NOTE — H&P (Signed)
[name] is an 75 y.o. female.   Chief Complaint: Left abdominal and flank pain with nausea. Newly diagnosed 6 x 10 mm impacted left proximal ureteral stone HPI: [name] is currently 75 years of age. She has no significant prior urologic history. There is a family history of nephrolithiasis. She developed the fairly sudden onset of left lower quadrant abdominal pain this morning. She thought initially she might have diverticulitis. Her pain did radiate into her left flank. She had considerable nausea. Urinalysis was unremarkable. CT imaging revealed a 610 mm left proximal ureteral stone causing obstruction. She has had no fever or chills. She is currently afebrile. Her pain has persisted despite being here for 10 hours. The emergency room physician felt that she needed to have something more definitive. 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 Her most recent hemoglobin was just over 6. Her oncologist has recommended ongoing observation. She really takes no medication other than folic acid. She has an allergy to use shellfish.  Past Medical History  Diagnosis Date  . Cold agglutinin disease [facility])     Past Surgical History  Procedure Laterality Date  . Tonsillectomy    . Appendectomy    . Abdominal hysterectomy    . Exploratory laparotomy    . Femur im nail Left 08/04/2015    Procedure: INTRAMEDULLARY (IM) NAIL FEMORAL;  Surgeon: [name], MD;  Location: [facility];  Service: Orthopedics;  Laterality: Left;    History reviewed. No pertinent family history. Social History:  reports that she quit smoking about 3 months ago. Her smoking use included Cigarettes. She smoked 1.00 pack per day. She does not have any smokeless tobacco history on file. She reports that she drinks alcohol. Her drug history is not on file.  Allergies:  Allergies  Allergen Reactions  . Shellfish Allergy Hives     (Not in  a hospital admission)  Results for orders placed or performed during the hospital encounter of 11/12/15 (from the past 48 hour(s))  Lipase, blood     Status: None   Collection Time: 11/12/15  3:06 PM  Result Value Ref Range   Lipase 20 11 - 51 U/L  Comprehensive metabolic panel     Status: Abnormal   Collection Time: 11/12/15  3:06 PM  Result Value Ref Range   Sodium 143 135 - 145 mmol/L   Potassium 2.9 (L) 3.5 - 5.1 mmol/L   Chloride 103 101 - 111 mmol/L   CO2 28 22 - 32 mmol/L   Glucose, Bld 139 (H) 65 - 99 mg/dL   BUN 16 6 - 20 mg/dL   Creatinine, Ser 0.62 0.44 - 1.00 mg/dL   Calcium 8.9 8.9 - 10.3 mg/dL   Total Protein 7.0 6.5 - 8.1 g/dL   Albumin 4.4 3.5 - 5.0 g/dL   AST 18 15 - 41 U/L   ALT 7 (L) 14 - 54 U/L   Alkaline Phosphatase 96 38 - 126 U/L   Total Bilirubin 3.2 (H) 0.3 - 1.2 mg/dL   GFR calc non Af Amer >60 >60 mL/min   GFR calc Af Amer >60 >60 mL/min    Comment: (NOTE) The eGFR has been calculated using the CKD EPI equation. This calculation has not been validated in all clinical situations. eGFR's persistently <60 mL/min signify possible Chronic Kidney Disease.    Anion gap 12 5 - 15  CBC     Status:  Abnormal   Collection Time: 11/12/15  3:06 PM  Result Value Ref Range   WBC 15.5 (H) 4.0 - 10.5 K/uL   RBC 1.30 (L) 3.87 - 5.11 MIL/uL   Hemoglobin 6.1 (LL) 12.0 - 15.0 g/dL    Comment: CRITICAL RESULT CALLED TO, READ BACK BY AND VERIFIED WITH: NJIHI,O RN AT 1746 ON 2.19.17 BY EPPERSON,S    HCT 16.4 (L) 36.0 - 46.0 %   MCV 126.2 (H) 78.0 - 100.0 fL   MCH 46.9 (H) 26.0 - 34.0 pg   MCHC 37.2 (H) 30.0 - 36.0 g/dL    Comment: CORRECTED FOR COLD AGGLUTININS   RDW NOT CALCULATED 11.5 - 15.5 %   Platelets 468 (H) 150 - 400 K/uL  Type and screen [place]     Status: None (Preliminary result)   Collection Time: 11/12/15  3:06 PM  Result Value Ref Range   ABO/RH(D) PENDING    Antibody Screen PENDING    Sample Expiration 11/15/2015    Urinalysis, Routine w reflex microscopic (not at [facility])     Status: None   Collection Time: 11/12/15  3:58 PM  Result Value Ref Range   Color, Urine YELLOW YELLOW   APPearance CLEAR CLEAR   Specific Gravity, Urine 1.016 1.005 - 1.030   pH 6.0 5.0 - 8.0   Glucose, UA NEGATIVE NEGATIVE mg/dL   Hgb urine dipstick NEGATIVE NEGATIVE   Bilirubin Urine NEGATIVE NEGATIVE   Ketones, ur NEGATIVE NEGATIVE mg/dL   Protein, ur NEGATIVE NEGATIVE mg/dL   Nitrite NEGATIVE NEGATIVE   Leukocytes, UA NEGATIVE NEGATIVE    Comment: MICROSCOPIC NOT DONE ON URINES WITH NEGATIVE PROTEIN, BLOOD, LEUKOCYTES, NITRITE, OR GLUCOSE <1000 mg/dL.   Ct Abdomen Pelvis W Contrast  11/12/2015  CLINICAL DATA:  Severe left lower quadrant abdominal pain beginning today. History of diverticulitis. Diarrhea. EXAM: CT ABDOMEN AND PELVIS WITH CONTRAST TECHNIQUE: Multidetector CT imaging of the abdomen and pelvis was performed using the standard protocol following bolus administration of intravenous contrast. CONTRAST:  37m OMNIPAQUE IOHEXOL 300 MG/ML SOLN, 837mOMNIPAQUE IOHEXOL 300 MG/ML SOLN COMPARISON:  02/28/2009 FINDINGS: There are bilateral pleural effusions with dependent pulmonary atelectasis. There is probably early cirrhosis of the liver with relative prominence of the left lobe and caudate compared to the right. Sub cm cyst in the right lobe. There are several small gallstones dependent in the gallbladder. No CT evidence of cholecystitis. Mild periportal edema probably relates to a relative right heart failure. The spleen is normal. The pancreas is atrophic. The adrenal glands are normal. The right kidney is normal. The left kidney is swollen and there is hydronephrosis due to a stone impacted at the proximal ureter at the level of the lower pole of the kidney. This stone shows a length of 1 cm with a diameter of 6 mm. No other urinary tract stone. The aorta and its branch vessels show extensive atherosclerosis. No aneurysm. No  retroperitoneal mass or lymphadenopathy. No free intraperitoneal fluid or air. Patient has chronic diverticulosis but there is no evidence of diverticulitis at this time. There is an old compression deformity of T11. IMPRESSION: Acute hydronephrosis on the left due to a 10 x 6 mm stone impacted in the left ureter at the level of the lower pole of the left kidney. Diverticulosis without evidence of diverticulitis. 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 Gallstones without evidence of cholecystitis or obstruction. Bilateral pleural effusions and dependent pulmonary atelectasis. Electronically Signed   By: MaJan Fireman.  On: 11/12/2015 19:37    Review of Systems - Negative except abdominal, back pain and nausea. No gross hematuria. No other voiding complaints. Otherwise negative.  Blood pressure 145/63, pulse 78, temperature 97.5 F (36.4 C), temperature source Oral, resp. rate 16, SpO2 100 %. General appearance: alert, cooperative and no distress Neck: no adenopathy and no JVD Resp: clear to auscultation bilaterally Cardio: regular rate and rhythm GI: soft, non-tender; bowel sounds normal; no masses,  no organomegaly Extremities: extremities normal, atraumatic, no cyanosis or edema Skin: Skin color, texture, turgor normal. No rashes or lesions Neurologic: Grossly normal  Assessment/Plan Impacted obstructing 610 mm left proximal ureteral stone. Patient has been in the ER for 10 hours with ongoing pain and nausea. I recommended temporary JJ stenting to alleviate the obstruction. We will then review her images and plain x-rays in detail to determine stone density and visualization. We can then discuss with her at length the advantages and disadvantages of ESWL versus ureteroscopy with holmium laser lithotripsy at a later date for definitive stone management. We will consult with anesthesiology with regard to whether the patient needs a transfusion. We would not expect any blood loss with our procedure  tonight. There is not been to be anybody at home that we'll be able to care for her this evening and therefore she will require overnight admission for observation. We can have her oncologist consult in the morning if necessary.  Keiffer Piper S 11/12/2015, 9:12 PM

## 2015-11-12 NOTE — ED Notes (Addendum)
Severe pain in left lower abdominal quadrant starting this morning. Hx of diverticulitis, states this feels very similar. Pt having several loose stools and has also began vomiting in the last hour. Hx of Cold-Agluttin disease that causes low Hgb. States they have not transfused d/t pt hemoglobin improving from 6.0 to 6.4, pt has not been symptomatic per family member.

## 2015-11-12 NOTE — ED Notes (Signed)
OR called and stated that Dr Risa Grill is ready for the patient. I was instructed to get a consent form and take the patient to the OR immediately. All pt's valuables given to her sister. Consent was filled out when I handed off care with the exception of the providers portion.

## 2015-11-12 NOTE — Anesthesia Postprocedure Evaluation (Signed)
Anesthesia Post Note  Patient: [name]  Procedure(s) Performed: Procedure(s): [place]  Patient location during evaluation: PACU Anesthesia Type: General Level of consciousness: sedated Pain management: satisfactory to patient Vital Signs Assessment: post-procedure vital signs reviewed and stable Respiratory status: spontaneous breathing Cardiovascular status: stable Anesthetic complications: no    Last Vitals:  Filed Vitals:   11/12/15 2049 11/12/15 2230  BP: 145/63 122/54  Pulse: 78 78  Temp:  36.5 C  Resp: 16 18    Last Pain:  Filed Vitals:   11/12/15 2244  PainSc: 0-No pain                 [name]

## 2015-11-12 NOTE — ED Provider Notes (Signed)
CSN: [id number]     Arrival date & time 11/12/15  1402 History   First MD Initiated Contact with Patient 11/12/15 1504     Chief Complaint  Patient presents with  . Abdominal Pain  . Diarrhea  . Emesis     (Consider location/radiation/quality/duration/timing/severity/associated sxs/prior Treatment) HPI   75 year old female presenting with her sister for evaluation of abdominal pain. Onset around 10:30 AM this morning. Pain is in the left lower quadrant initially and has since progressed towards the umbilicus and around into her left lower back. Symptoms constant. No appreciable exacerbating relieving factors. Nausea and vomiting. Diarrhea. No urinary complaints. No blood in stool or emesis. Patient reports a past history of diverticulitis complicated by abscess. She reports that this is medically managed and she did not require surgery. She reports that her pain is somewhat reminiscent of prior diverticulitis. Abdominal surgical history significant for appendectomy and hysterectomy. Also hx of cold agglutinin disease. Recent blood work with hemoglobin in 6s, but stable. Followed by heme/onc. Not tachycardic, hypotensive nor does she currently seem symptomatic with regards to this.   Past Medical History  Diagnosis Date  . Cold agglutinin disease [facility])    Past Surgical History  Procedure Laterality Date  . Tonsillectomy    . Appendectomy    . Abdominal hysterectomy    . Exploratory laparotomy    . Femur im nail Left 08/04/2015    Procedure: INTRAMEDULLARY (IM) NAIL FEMORAL;  Surgeon: [name], MD;  Location: [facility];  Service: Orthopedics;  Laterality: Left;   History reviewed. No pertinent family history. Social History  Substance Use Topics  . Smoking status: Former Smoker -- 1.00 packs/day    Types: Cigarettes    Quit date: 08/03/2015  . Smokeless tobacco: None  . Alcohol Use: Yes     Comment: occ   OB History    No data available     Review of Systems  All  systems reviewed and negative, other than as noted in HPI.   Allergies  Shellfish allergy  Home Medications   Prior to Admission medications   Medication Sig Start Date End Date Taking? Authorizing Provider  acetaminophen (TYLENOL) 325 MG tablet Take 2 tablets (650 mg total) by mouth every 6 (six) hours as needed for mild pain (or Fever >/= 101). 08/08/15  Yes Delfina Redwood, MD  Cyanocobalamin ([employer] B-12) 1000 MCG TBCR TAKE 1 TABLET DAILY 08/28/15  Yes Maryanna Shape, NP  docusate sodium (COLACE) 100 MG capsule Take 1 capsule (100 mg total) by mouth daily. 08/08/15  Yes Delfina Redwood, MD  folic acid (FOLVITE) 1 MG tablet TAKE 1 TABLET BY MOUTH DAILY 08/28/15  Yes Maryanna Shape, NP  nicotine (NICODERM CQ - DOSED IN MG/24 HOURS) 21 mg/24hr patch Place 1 patch (21 mg total) onto the skin daily. 08/08/15  Yes Delfina Redwood, MD   BP 142/66 mmHg  Pulse 74  Temp(Src) 97.5 F (36.4 C) (Oral)  Resp 18  SpO2 93% Physical Exam  Constitutional: She appears well-developed and well-nourished. No distress.  Laying in bed. Appears somewhat uncomfortable, but not toxic. Had to sit up to vomit at one point while I was in the room.  HENT:  Head: Normocephalic and atraumatic.  Eyes: Conjunctivae are normal. Right eye exhibits no discharge. Left eye exhibits no discharge.  Neck: Neck supple.  Cardiovascular: Normal rate, regular rhythm and normal heart sounds.  Exam reveals no gallop and no friction rub.   No murmur  heard. Pulmonary/Chest: Effort normal and breath sounds normal. No respiratory distress.  Abdominal: Soft. She exhibits no distension. There is tenderness.  Mild periumbilical tenderness  Musculoskeletal: She exhibits no edema or tenderness.  Neurological: She is alert.  Skin: Skin is warm and dry.  Psychiatric: She has a normal mood and affect. Her behavior is normal. Thought content normal.  Nursing note and vitals reviewed.   ED Course  Procedures (including  critical care time) Labs Review Labs Reviewed  COMPREHENSIVE METABOLIC PANEL - Abnormal; Notable for the following:    Potassium 2.9 (*)    Glucose, Bld 139 (*)    ALT 7 (*)    Total Bilirubin 3.2 (*)    All other components within normal limits  CBC - Abnormal; Notable for the following:    WBC 15.5 (*)    RBC 1.30 (*)    Hemoglobin 6.1 (*)    HCT 16.4 (*)    MCV 126.2 (*)    MCH 46.9 (*)    MCHC 37.2 (*)    Platelets 468 (*)    All other components within normal limits  LIPASE, BLOOD  URINALYSIS, ROUTINE W REFLEX MICROSCOPIC (NOT AT [facility])  TYPE AND SCREEN    Imaging Review Ct Abdomen Pelvis W Contrast  11/12/2015  CLINICAL DATA:  Severe left lower quadrant abdominal pain beginning today. History of diverticulitis. Diarrhea. EXAM: CT ABDOMEN AND PELVIS WITH CONTRAST TECHNIQUE: Multidetector CT imaging of the abdomen and pelvis was performed using the standard protocol following bolus administration of intravenous contrast. CONTRAST:  74mL OMNIPAQUE IOHEXOL 300 MG/ML SOLN, 22mL OMNIPAQUE IOHEXOL 300 MG/ML SOLN COMPARISON:  02/28/2009 FINDINGS: There are bilateral pleural effusions with dependent pulmonary atelectasis. There is probably early cirrhosis of the liver with relative prominence of the left lobe and caudate compared to the right. Sub cm cyst in the right lobe. There are several small gallstones dependent in the gallbladder. No CT evidence of cholecystitis. Mild periportal edema probably relates to a relative right heart failure. The spleen is normal. The pancreas is atrophic. The adrenal glands are normal. The right kidney is normal. The left kidney is swollen and there is hydronephrosis due to a stone impacted at the proximal ureter at the level of the lower pole of the kidney. This stone shows a length of 1 cm with a diameter of 6 mm. No other urinary tract stone. The aorta and its branch vessels show extensive atherosclerosis. No aneurysm. No retroperitoneal mass or  lymphadenopathy. No free intraperitoneal fluid or air. Patient has chronic diverticulosis but there is no evidence of diverticulitis at this time. There is an old compression deformity of T11. IMPRESSION: Acute hydronephrosis on the left due to a 10 x 6 mm stone impacted in the left ureter at the level of the lower pole of the left kidney. Diverticulosis without evidence of diverticulitis. 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 Gallstones without evidence of cholecystitis or obstruction. Bilateral pleural effusions and dependent pulmonary atelectasis. Electronically Signed   By: Nelson Chimes M.D.   On: 11/12/2015 19:37   I have personally reviewed and evaluated these images and lab results as part of my medical decision-making.   EKG Interpretation None      MDM   Final diagnoses:  Ureteral calculus    75 year old female with lower abdominal pain. Patient reports left lower quadrant and also associated with change in stool. Past history diverticulitis in her symptoms are consistent with this. Her abdominal exam is fairly benign though. May be some  mild periumbilical tenderness. She does not seem distended. Given her past history, will CT. Treat symptoms.  Ct w/o diverticulitis, but does have 6x54mm proximal L ureteral stone. Afebrile. UA without signs of infection. Renal function ok. Pretty symptomatic. Mostly nausea, but also some pain. With size of stone, not convinced will be able to adequately control symptoms  Virgel Manifold, MD 11/16/15 2232

## 2015-11-12 NOTE — Anesthesia Procedure Notes (Signed)
Procedure Name: Intubation Date/Time: 11/12/2015 10:01 PM Performed by: Lajuana Carry E Pre-anesthesia Checklist: Patient identified, Emergency Drugs available, Suction available and Patient being monitored Patient Re-evaluated:Patient Re-evaluated prior to inductionOxygen Delivery Method: Circle System Utilized Preoxygenation: Pre-oxygenation with 100% oxygen Intubation Type: IV induction, Rapid sequence and Cricoid Pressure applied Laryngoscope Size: Miller and 2 Grade View: Grade I Tube type: Oral Tube size: 6.5 mm Number of attempts: 1 Airway Equipment and Method: Stylet Placement Confirmation: ETT inserted through vocal cords under direct vision,  positive ETCO2 and breath sounds checked- equal and bilateral Secured at: 20 cm Tube secured with: Tape Dental Injury: Teeth and Oropharynx as per pre-operative assessment

## 2015-11-12 NOTE — Anesthesia Preprocedure Evaluation (Addendum)
Anesthesia Evaluation  Patient identified by MRN, date of birth, ID band Patient awake    Reviewed: Allergy & Precautions, H&P , NPO status , Patient's Chart, lab work & pertinent test results, reviewed documented beta blocker date and time   History of Anesthesia Complications Negative for: history of anesthetic complications  Airway Mallampati: II  TM Distance: >3 FB Neck ROM: full    Dental no notable dental hx.    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 Tobacco abuse, nicotine patch  Reproductive/Obstetrics negative OB ROS                            Anesthesia Physical  Anesthesia Plan  ASA: III and emergent  Anesthesia Plan: General   Post-op Pain Management:    Induction: Intravenous, Rapid sequence and Cricoid pressure planned  Airway Management Planned: Oral ETT  Additional Equipment:   Intra-op Plan:   Post-operative Plan: Extubation in OR  Informed Consent: I have reviewed the patients History and Physical, chart, labs and discussed the procedure including the risks, benefits and alternatives for the proposed anesthesia with the patient or authorized representative who has indicated his/her understanding and acceptance.   Dental Advisory Given  Plan Discussed with: Anesthesiologist, CRNA and Surgeon  Anesthesia Plan Comments: (Discussed GA with LMA, possible sore throat, potential need to switch to ETT, N/V, pulmonary  aspiration. Questions answered. )       Anesthesia Quick Evaluation

## 2015-11-12 NOTE — Op Note (Signed)
Preoperative diagnosis: Obstructing 610 mm left proximal ureteral stone with inability to control pain and nausea Postoperative diagnosis: Same  Procedure: Cystoscopy, left retrograde pyelography, left double-J stent placement 6 French 24 cm   Surgeon: Bernestine Amass M.D.  Anesthesia: Gen.  Indications: Patient has no prior urologic history. She presented today with left lower quadrant abdominal pain was eventually diagnosed with a 610 mm left proximal obstructing ureteral stone. She has had persistent pain and nausea despite being in the ER for 8-10 hours. We have recommended intervention to unobstruct the kidney with left double-J stent placement followed by ureteroscopy and holmium laser lithotripsy or ESWL pending further discussion and review of her imaging.     Technique and findings: Patient was brought the operating room where she had successful induction general anesthesia. She was placed in lithotomy position and prepped and draped in usual manner. Appropriate surgical timeout was performed. Cystoscopy revealed unremarkable bladder. Retrograde pyelography was done with fluoroscopic interpretation and showed high-grade obstruction with a significant filling defect in the proximal ureter consistent with the above mentioned stone. A guidewire was placed to the renal pelvis.   Over the guidewire we placed a 6 French 24 cm double-J stent. No obvious complications occurred and the patient was brought to recovery room in stable condition.

## 2015-11-12 NOTE — Transfer of Care (Signed)
Immediate Anesthesia Transfer of Care Note  Patient: [name]  Procedure(s) Performed: Procedure(s): CYSTOSCOPY WITH RETROGRADE PYELOGRAM/URETERAL STENT PLACEMENT  Patient Location: PACU  Anesthesia Type:General  Level of Consciousness:  sedated, patient cooperative and responds to stimulation  Airway & Oxygen Therapy:Patient Spontanous Breathing and Patient connected to face mask oxgen  Post-op Assessment:  Report given to PACU RN and Post -op Vital signs reviewed and stable  Post vital signs:  Reviewed and stable  Last Vitals:  Filed Vitals:   11/12/15 2048 11/12/15 2049  BP: 145/63 145/63  Pulse: 75 78  Temp:    Resp: 14 16    Complications: No apparent anesthesia complications

## 2015-11-13 ENCOUNTER — Encounter (HOSPITAL_COMMUNITY): Payer: Self-pay | Admitting: *Deleted

## 2015-11-13 ENCOUNTER — Ambulatory Visit (HOSPITAL_COMMUNITY): Payer: Medicare Other

## 2015-11-13 LAB — HEMOGLOBIN AND HEMATOCRIT, BLOOD
HEMATOCRIT: 16.6 % — AB (ref 36.0–46.0)
Hemoglobin: 6.1 g/dL — CL (ref 12.0–15.0)

## 2015-11-13 MED ORDER — HYDROCODONE-ACETAMINOPHEN 5-325 MG PO TABS
1.0000 | ORAL_TABLET | ORAL | Status: DC | PRN
Start: 2015-11-13 — End: 2017-05-22

## 2015-11-13 NOTE — Progress Notes (Signed)
Patient is alert and oriented. No s/s of acute distress. Patient tolerating regular diet. No complaints of pain or discomfort. At the beginning of the shift, patients oxygen level decreased when removing nasal cannula. Slowly decreased oxygen to 2L and patient was at 98%. Decreased to 1L and patient was at 95%, took patient off nasal cannula to RA and patient was at 93% after using incentive spirometer use. Patient had no complaints of SOB or SOB on exertion. IV was red and uncomfortable removed intact. Patient discharged with her sister. Reviewed discharge information,education and medications. Patient states understanding and has no questions at this time.

## 2015-11-13 NOTE — Discharge Summary (Signed)
Patient ID: [name] MRN: [id number] DOB/AGE: 75-Feb-1942 75 y.o.  Admit date: 11/12/2015 Discharge date: 11/13/2015    Discharge Diagnoses:   Present on Admission:  . Ureteral calculus  Consults:  None    Discharge Medications:   Medication List    TAKE these medications        acetaminophen 325 MG tablet  Commonly known as:  TYLENOL  Take 2 tablets (650 mg total) by mouth every 6 (six) hours as needed for mild pain (or Fever >/= 101).     Cyanocobalamin 1000 MCG Tbcr  Commonly known as:  [employer] B-12  TAKE 1 TABLET DAILY     docusate sodium 100 MG capsule  Commonly known as:  COLACE  Take 1 capsule (100 mg total) by mouth daily.     folic acid 1 MG tablet  Commonly known as:  FOLVITE  TAKE 1 TABLET BY MOUTH DAILY     HYDROcodone-acetaminophen 5-325 MG tablet  Commonly known as:  NORCO/VICODIN  Take 1-2 tablets by mouth every 4 (four) hours as needed for moderate pain.     nicotine 21 mg/24hr patch  Commonly known as:  NICODERM CQ - dosed in mg/24 hours  Place 1 patch (21 mg total) onto the skin daily.         Significant Diagnostic Studies:  Ct Abdomen Pelvis W Contrast  11/12/2015  CLINICAL DATA:  Severe left lower quadrant abdominal pain beginning today. History of diverticulitis. Diarrhea. EXAM: CT ABDOMEN AND PELVIS WITH CONTRAST TECHNIQUE: Multidetector CT imaging of the abdomen and pelvis was performed using the standard protocol following bolus administration of intravenous contrast. CONTRAST:  37mL OMNIPAQUE IOHEXOL 300 MG/ML SOLN, 27mL OMNIPAQUE IOHEXOL 300 MG/ML SOLN COMPARISON:  02/28/2009 FINDINGS: There are bilateral pleural effusions with dependent pulmonary atelectasis. There is probably early cirrhosis of the liver with relative prominence of the left lobe and caudate compared to the right. Sub cm cyst in the right lobe. There are several small gallstones dependent in the gallbladder. No CT evidence of cholecystitis. Mild periportal edema  probably relates to a relative right heart failure. The spleen is normal. The pancreas is atrophic. The adrenal glands are normal. The right kidney is normal. The left kidney is swollen and there is hydronephrosis due to a stone impacted at the proximal ureter at the level of the lower pole of the kidney. This stone shows a length of 1 cm with a diameter of 6 mm. No other urinary tract stone. The aorta and its branch vessels show extensive atherosclerosis. No aneurysm. No retroperitoneal mass or lymphadenopathy. No free intraperitoneal fluid or air. Patient has chronic diverticulosis but there is no evidence of diverticulitis at this time. There is an old compression deformity of T11. IMPRESSION: Acute hydronephrosis on the left due to a 10 x 6 mm stone impacted in the left ureter at the level of the lower pole of the left kidney. Diverticulosis without evidence of diverticulitis. 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 Gallstones without evidence of cholecystitis or obstruction. Bilateral pleural effusions and dependent pulmonary atelectasis. Electronically Signed   By: Nelson Chimes M.D.   On: 11/12/2015 19:37      Hospital Course:  Active Problems:   Ureteral calculus  Patient had continued pain and nausea despite efforts to make her comfortable in the ER. That reason we took her to surgery and perform retrograde pyelogram which confirmed the presence of high-grade obstruction secondary to a proximal left ureteral stone. JJ stent was placed. The patient was  kept overnight primarily due to the fact that no B was available to monitor her at home. She had uneventful postoperative course and her pain and nausea have resolved with stent placement. She will require definitive surgery at a later date. She is to have a KUB prior to discharge to determine how radiopaque the stone is whether lithotripsy as an option. Day of Discharge BP 130/45 mmHg  Pulse 80  Temp(Src) 98.1 F (36.7 C) (Oral)  Resp 16  Ht 5\' 2"   (1.575 m)  Wt 46.176 kg (101 lb 12.8 oz)  BMI 18.61 kg/m2  SpO2 97% Well-developed well-nourished female in no acute distress Respiratory effort normal Abdomen benign Results for orders placed or performed during the hospital encounter of 11/12/15 (from the past 24 hour(s))  Lipase, blood     Status: None   Collection Time: 11/12/15  3:06 PM  Result Value Ref Range   Lipase 20 11 - 51 U/L  Comprehensive metabolic panel     Status: Abnormal   Collection Time: 11/12/15  3:06 PM  Result Value Ref Range   Sodium 143 135 - 145 mmol/L   Potassium 2.9 (L) 3.5 - 5.1 mmol/L   Chloride 103 101 - 111 mmol/L   CO2 28 22 - 32 mmol/L   Glucose, Bld 139 (H) 65 - 99 mg/dL   BUN 16 6 - 20 mg/dL   Creatinine, Ser 0.62 0.44 - 1.00 mg/dL   Calcium 8.9 8.9 - 10.3 mg/dL   Total Protein 7.0 6.5 - 8.1 g/dL   Albumin 4.4 3.5 - 5.0 g/dL   AST 18 15 - 41 U/L   ALT 7 (L) 14 - 54 U/L   Alkaline Phosphatase 96 38 - 126 U/L   Total Bilirubin 3.2 (H) 0.3 - 1.2 mg/dL   GFR calc non Af Amer >60 >60 mL/min   GFR calc Af Amer >60 >60 mL/min   Anion gap 12 5 - 15  CBC     Status: Abnormal   Collection Time: 11/12/15  3:06 PM  Result Value Ref Range   WBC 15.5 (H) 4.0 - 10.5 K/uL   RBC 1.30 (L) 3.87 - 5.11 MIL/uL   Hemoglobin 6.1 (LL) 12.0 - 15.0 g/dL   HCT 16.4 (L) 36.0 - 46.0 %   MCV 126.2 (H) 78.0 - 100.0 fL   MCH 46.9 (H) 26.0 - 34.0 pg   MCHC 37.2 (H) 30.0 - 36.0 g/dL   RDW NOT CALCULATED 11.5 - 15.5 %   Platelets 468 (H) 150 - 400 K/uL  Type and screen [place]     Status: None   Collection Time: 11/12/15  3:06 PM  Result Value Ref Range   ABO/RH(D) B POS    Antibody Screen NEG    Sample Expiration 11/15/2015    Antibody Identification ANTI I    DAT, IgG NEG   Urinalysis, Routine w reflex microscopic (not at [facility])     Status: None   Collection Time: 11/12/15  3:58 PM  Result Value Ref Range   Color, Urine YELLOW YELLOW   APPearance CLEAR CLEAR   Specific Gravity, Urine  1.016 1.005 - 1.030   pH 6.0 5.0 - 8.0   Glucose, UA NEGATIVE NEGATIVE mg/dL   Hgb urine dipstick NEGATIVE NEGATIVE   Bilirubin Urine NEGATIVE NEGATIVE   Ketones, ur NEGATIVE NEGATIVE mg/dL   Protein, ur NEGATIVE NEGATIVE mg/dL   Nitrite NEGATIVE NEGATIVE   Leukocytes, UA NEGATIVE NEGATIVE  Hemoglobin and hematocrit, blood  Status: Abnormal   Collection Time: 11/13/15  4:21 AM  Result Value Ref Range   Hemoglobin 6.1 (LL) 12.0 - 15.0 g/dL   HCT 16.6 (L) 36.0 - 46.0 %

## 2015-11-13 NOTE — Discharge Instructions (Signed)
[facility] [phone] Post Ureteroscopy With or Without Stent Instructions  Definitions:  Ureter: The duct that transports urine from the kidney to the bladder. Stent:   A plastic hollow tube that is placed into the ureter, from the kidney to the                 bladder to prevent the ureter from swelling shut.  GENERAL INSTRUCTIONS:  Despite the fact that no skin incisions were used, the area around the ureter and bladder is raw and irritated. The stent is a foreign body which will further irritate the bladder wall. This irritation is manifested by increased frequency of urination, both day and night, and by an increase in the urge to urinate. In some, the urge to urinate is present almost always. Sometimes the urge is strong enough that you may not be able to stop yourself from urinating. The only real cure is to remove the stent and then give time for the bladder wall to heal which can't be done until the danger of the ureter swelling shut has passed, which varies.  You may see some blood in your urine while the stent is in place and a few days afterwards. Do not be alarmed, even if the urine was clear for a while. Get off your feet and drink lots of fluids until clearing occurs. If you start to pass clots or don't improve, call us.  DIET: You may return to your normal diet immediately. Because of the raw surface of your bladder, alcohol, spicy foods, acid type foods and drinks with caffeine may cause irritation or frequency and should be used in moderation. To keep your urine flowing freely and to avoid constipation, drink plenty of fluids during the day ( 8-10 glasses ). Tip: Avoid cranberry juice because it is very acidic.  ACTIVITY: Your physical activity doesn't need to be restricted. However, if you are very active, you may see some blood in your urine. We suggest that you reduce your activity under these circumstances until the bleeding has stopped.  BOWELS: It is  important to keep your bowels regular during the postoperative period. Straining with bowel movements can cause bleeding. A bowel movement every other day is reasonable. Use a mild laxative if needed, such as Milk of Magnesia 2-3 tablespoons, or 2 Dulcolax tablets. Call if you continue to have problems. If you have been taking narcotics for pain, before, during or after your surgery, you may be constipated. Take a laxative if necessary.   MEDICATION: You should resume your pre-surgery medications unless told not to. In addition you will often be given an antibiotic to prevent infection. These should be taken as prescribed until the bottles are finished unless you are having an unusual reaction to one of the drugs.  PROBLEMS YOU SHOULD REPORT TO [place]:  Fevers over 100.5 Fahrenheit.  Heavy bleeding, or clots ( See above notes about blood in urine ).  Inability to urinate.  Drug reactions ( hives, rash, nausea, vomiting, diarrhea ).  Severe burning or pain with urination that is not improving.  FOLLOW-UP: We will need to set up a follow-up surgery in 7-10 days to definitively manage the stone. We will contact you with that information

## 2015-11-20 ENCOUNTER — Encounter (HOSPITAL_BASED_OUTPATIENT_CLINIC_OR_DEPARTMENT_OTHER): Payer: Self-pay | Admitting: *Deleted

## 2015-11-20 ENCOUNTER — Other Ambulatory Visit: Payer: Self-pay | Admitting: Urology

## 2015-11-20 NOTE — Anesthesia Preprocedure Evaluation (Addendum)
Anesthesia Evaluation  Patient identified by MRN, date of birth, ID band Patient awake    Reviewed: Allergy & Precautions, H&P , NPO status , Patient's Chart, lab work & pertinent test results, reviewed documented beta blocker date and time   History of Anesthesia Complications Negative for: history of anesthetic complications  Airway Mallampati: II  TM Distance: >3 FB Neck ROM: full    Dental no notable dental hx. 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),    Pulmonary exam normal breath sounds clear to auscultation       Cardiovascular negative cardio ROS Normal cardiovascular exam Rhythm:regular Rate:Normal     Neuro/Psych negative neurological ROS     GI/Hepatic negative GI ROS, Neg liver ROS,   Endo/Other  negative endocrine ROS  Renal/GU negative Renal ROSSTONES     Musculoskeletal   Abdominal   Peds  Hematology  (+) anemia , H/H 6/16,  Has been this low for a long time,  Has cold agglutinin hemolitic anemia   Anesthesia Other Findings Cold agglutinin, 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 Tobacco abuse, nicotine patch  Reproductive/Obstetrics negative OB ROS                          Anesthesia Physical Anesthesia Plan  ASA: III  Anesthesia Plan: General   Post-op Pain Management:    Induction: Intravenous  Airway Management Planned: LMA and Oral ETT  Additional Equipment:   Intra-op Plan:   Post-operative Plan: Extubation in OR  Informed Consent: I have reviewed the patients History and Physical, chart, labs and discussed the procedure including the risks, benefits and alternatives for the proposed anesthesia with the patient or authorized representative who has indicated his/her understanding and acceptance.     Plan Discussed with:   Anesthesia Plan Comments: (Was intubated last time miller 2, 6.5 ETT, Has Cold agglutinin hemolytic anemia,  need to keep warm, low H/H 6/16 been this way for a long time, K 2.9, Check am labs)        Anesthesia Quick Evaluation

## 2015-11-20 NOTE — Progress Notes (Addendum)
NPO AFTER MN.  ARRIVE AT 0745.  NEEDS H/H.  OTHER LAB RESULTS W/ CHART AND EPIC.  MAY TAKE TYLENOL/ HYDROCODONE AM DOS W/ SIPS OF WATER .  REVIEWED CHART W/ DR MARY JUDD MDA,  STATED OK TO PROCEED.  NOTIFIED  OR ABOUT PT NEEDING TO WARM IS POSSIBLE DUE TO DX COLD AGGLUTININ DISEASE, SPOKE W/ KEALA.

## 2015-11-22 ENCOUNTER — Encounter (HOSPITAL_BASED_OUTPATIENT_CLINIC_OR_DEPARTMENT_OTHER)
Admission: RE | Disposition: A | Discharge status: Home / Self Care | Payer: Self-pay | Source: Ambulatory Visit | Attending: Urology

## 2015-11-22 ENCOUNTER — Ambulatory Visit (HOSPITAL_BASED_OUTPATIENT_CLINIC_OR_DEPARTMENT_OTHER)
Admission: RE | Admit: 2015-11-22 | Discharge: 2015-11-22 | Disposition: A | Discharge status: Home / Self Care | Payer: Medicare Other | Source: Ambulatory Visit | Attending: Urology | Admitting: Urology

## 2015-11-22 ENCOUNTER — Ambulatory Visit (HOSPITAL_BASED_OUTPATIENT_CLINIC_OR_DEPARTMENT_OTHER): Payer: Medicare Other | Admitting: Anesthesiology

## 2015-11-22 ENCOUNTER — Encounter (HOSPITAL_BASED_OUTPATIENT_CLINIC_OR_DEPARTMENT_OTHER): Payer: Self-pay | Admitting: *Deleted

## 2015-11-22 DIAGNOSIS — N132 Hydronephrosis with renal and ureteral calculous obstruction: Secondary | ICD-10-CM | POA: Diagnosis not present

## 2015-11-22 DIAGNOSIS — Z90710 Acquired absence of both cervix and uterus: Secondary | ICD-10-CM | POA: Insufficient documentation

## 2015-11-22 DIAGNOSIS — J449 Chronic obstructive pulmonary disease, unspecified: Secondary | ICD-10-CM | POA: Diagnosis not present

## 2015-11-22 DIAGNOSIS — D649 Anemia, unspecified: Secondary | ICD-10-CM | POA: Insufficient documentation

## 2015-11-22 DIAGNOSIS — Z87891 Personal history of nicotine dependence: Secondary | ICD-10-CM | POA: Diagnosis not present

## 2015-11-22 DIAGNOSIS — Z9049 Acquired absence of other specified parts of digestive tract: Secondary | ICD-10-CM | POA: Insufficient documentation

## 2015-11-22 HISTORY — DX: Cold autoimmune hemolytic anemia: D59.12

## 2015-11-22 HISTORY — DX: Deficiency of other specified B group vitamins: E53.8

## 2015-11-22 HISTORY — DX: Diverticulosis of large intestine without perforation or abscess without bleeding: K57.30

## 2015-11-22 HISTORY — DX: Other autoimmune hemolytic anemias: D59.1

## 2015-11-22 HISTORY — PX: CYSTOSCOPY/URETEROSCOPY/HOLMIUM LASER: SHX6545

## 2015-11-22 HISTORY — DX: Chronic obstructive pulmonary disease, unspecified: J44.9

## 2015-11-22 HISTORY — DX: Personal history of other diseases of the digestive system: Z87.19

## 2015-11-22 HISTORY — DX: Calculus of kidney with calculus of ureter: N20.2

## 2015-11-22 HISTORY — DX: Occlusion and stenosis of bilateral carotid arteries: I65.23

## 2015-11-22 HISTORY — PX: CYSTOSCOPY W/ URETERAL STENT REMOVAL: SHX1430

## 2015-11-22 LAB — POCT HEMOGLOBIN-HEMACUE: HEMOGLOBIN: 7 g/dL — AB (ref 12.0–15.0)

## 2015-11-22 SURGERY — REMOVAL, STENT, URETER, CYSTOSCOPIC
Anesthesia: General | Laterality: Left

## 2015-11-22 MED ORDER — PROPOFOL 10 MG/ML IV BOLUS
INTRAVENOUS | Status: DC | PRN
  Administered 2015-11-22: 100 mg via INTRAVENOUS

## 2015-11-22 MED ORDER — IOHEXOL 350 MG/ML SOLN
INTRAVENOUS | Status: DC | PRN
Start: 2015-11-22 — End: 2015-11-22
  Administered 2015-11-22: 10 mL

## 2015-11-22 MED ORDER — DEXAMETHASONE SODIUM PHOSPHATE 10 MG/ML IJ SOLN
INTRAMUSCULAR | Status: AC
  Filled 2015-11-22: qty 1

## 2015-11-22 MED ORDER — DEXAMETHASONE SODIUM PHOSPHATE 10 MG/ML IJ SOLN
INTRAMUSCULAR | Status: DC | PRN
  Administered 2015-11-22: 4 mg via INTRAVENOUS

## 2015-11-22 MED ORDER — FENTANYL CITRATE (PF) 100 MCG/2ML IJ SOLN
25.0000 ug | INTRAMUSCULAR | Status: DC | PRN
  Filled 2015-11-22: qty 1

## 2015-11-22 MED ORDER — LIDOCAINE HCL (CARDIAC) 20 MG/ML IV SOLN
INTRAVENOUS | Status: AC
  Filled 2015-11-22: qty 5

## 2015-11-22 MED ORDER — MEPERIDINE HCL 25 MG/ML IJ SOLN
6.2500 mg | INTRAMUSCULAR | Status: DC | PRN
  Filled 2015-11-22: qty 1

## 2015-11-22 MED ORDER — LACTATED RINGERS IV SOLN
INTRAVENOUS | Status: DC
  Administered 2015-11-22: 09:00:00 via INTRAVENOUS
  Filled 2015-11-22: qty 1000

## 2015-11-22 MED ORDER — CEFAZOLIN SODIUM-DEXTROSE 2-3 GM-% IV SOLR
2.0000 g | INTRAVENOUS | Status: AC
  Administered 2015-11-22: 2 g via INTRAVENOUS
  Filled 2015-11-22: qty 50

## 2015-11-22 MED ORDER — FENTANYL CITRATE (PF) 100 MCG/2ML IJ SOLN
INTRAMUSCULAR | Status: DC | PRN
  Administered 2015-11-22 (×4): 12.5 ug via INTRAVENOUS
  Administered 2015-11-22 (×2): 6.25 ug via INTRAVENOUS
  Administered 2015-11-22 (×3): 12.5 ug via INTRAVENOUS

## 2015-11-22 MED ORDER — ONDANSETRON HCL 4 MG/2ML IJ SOLN
INTRAMUSCULAR | Status: DC | PRN
  Administered 2015-11-22: 4 mg via INTRAVENOUS

## 2015-11-22 MED ORDER — LIDOCAINE HCL (CARDIAC) 20 MG/ML IV SOLN
INTRAVENOUS | Status: DC | PRN
  Administered 2015-11-22: 40 mg via INTRAVENOUS

## 2015-11-22 MED ORDER — ONDANSETRON HCL 4 MG/2ML IJ SOLN
INTRAMUSCULAR | Status: AC
  Filled 2015-11-22: qty 2

## 2015-11-22 MED ORDER — KETOROLAC TROMETHAMINE 30 MG/ML IJ SOLN
INTRAMUSCULAR | Status: DC | PRN
  Administered 2015-11-22: 30 mg via INTRAVENOUS

## 2015-11-22 MED ORDER — SODIUM CHLORIDE 0.9 % IR SOLN
Status: DC | PRN
  Administered 2015-11-22: 4000 mL

## 2015-11-22 MED ORDER — PROMETHAZINE HCL 25 MG/ML IJ SOLN
6.2500 mg | INTRAMUSCULAR | Status: DC | PRN
  Filled 2015-11-22: qty 1

## 2015-11-22 MED ORDER — PROPOFOL 10 MG/ML IV BOLUS
INTRAVENOUS | Status: AC
  Filled 2015-11-22: qty 20

## 2015-11-22 MED ORDER — CEFAZOLIN SODIUM-DEXTROSE 2-3 GM-% IV SOLR
INTRAVENOUS | Status: AC
  Filled 2015-11-22: qty 50

## 2015-11-22 MED ORDER — FENTANYL CITRATE (PF) 100 MCG/2ML IJ SOLN
INTRAMUSCULAR | Status: AC
  Filled 2015-11-22: qty 2

## 2015-11-22 SURGICAL SUPPLY — 34 items
ADAPTER CATH URET PLST 4-6FR (CATHETERS) IMPLANT
ADPR CATH URET STRL DISP 4-6FR (CATHETERS)
APL SKNCLS STERI-STRIP NONHPOA (GAUZE/BANDAGES/DRESSINGS)
BAG DRAIN URO-CYSTO SKYTR STRL (DRAIN) ×2 IMPLANT
BAG DRN UROCATH (DRAIN) ×1
BASKET DAKOTA 1.9FR 11X120 (BASKET) ×1 IMPLANT
BENZOIN TINCTURE PRP APPL 2/3 (GAUZE/BANDAGES/DRESSINGS) IMPLANT
CANISTER SUCT LVC 12 LTR MEDI- (MISCELLANEOUS) IMPLANT
CATH INTERMIT  6FR 70CM (CATHETERS) IMPLANT
CATH URET 5FR 28IN CONE TIP (BALLOONS)
CATH URET 5FR 28IN OPEN ENDED (CATHETERS) IMPLANT
CATH URET 5FR 70CM CONE TIP (BALLOONS) IMPLANT
CLOTH BEACON ORANGE TIMEOUT ST (SAFETY) ×2 IMPLANT
FIBER LASER FLEXIVA 200 (UROLOGICAL SUPPLIES) ×1 IMPLANT
GLOVE BIO SURGEON STRL SZ7.5 (GLOVE) ×2 IMPLANT
GOWN STRL REUS W/ TWL LRG LVL3 (GOWN DISPOSABLE) ×1 IMPLANT
GOWN STRL REUS W/ TWL XL LVL3 (GOWN DISPOSABLE) ×1 IMPLANT
GOWN STRL REUS W/TWL LRG LVL3 (GOWN DISPOSABLE) ×2
GOWN STRL REUS W/TWL XL LVL3 (GOWN DISPOSABLE) ×2
GUIDEWIRE 0.038 PTFE COATED (WIRE) IMPLANT
GUIDEWIRE ANG ZIPWIRE 038X150 (WIRE) IMPLANT
GUIDEWIRE STR DUAL SENSOR (WIRE) IMPLANT
IV NS 1000ML (IV SOLUTION) ×2
IV NS 1000ML BAXH (IV SOLUTION) IMPLANT
KIT BALLIN UROMAX 15FX10 (LABEL) IMPLANT
KIT BALLN UROMAX 15FX4 (MISCELLANEOUS) IMPLANT
KIT BALLN UROMAX 26 75X4 (MISCELLANEOUS)
KIT ROOM TURNOVER WOR (KITS) ×2 IMPLANT
MANIFOLD NEPTUNE II (INSTRUMENTS) IMPLANT
NS IRRIG 500ML POUR BTL (IV SOLUTION) ×1 IMPLANT
PACK CYSTO (CUSTOM PROCEDURE TRAY) ×2 IMPLANT
SET HIGH PRES BAL DIL (LABEL)
SHEATH ACCESS URETERAL 38CM (SHEATH) ×1 IMPLANT
TUBE CONNECTING 12X1/4 (SUCTIONS) IMPLANT

## 2015-11-22 NOTE — Anesthesia Postprocedure Evaluation (Signed)
Anesthesia Post Note  Patient: [name]  Procedure(s) Performed: Procedure(s) (LRB): CYSTOSCOPY WITH STENT REMOVAL (Left) CYSTOSCOPY/URETEROSCOPY/HOLMIUM LASER (Left)  Patient location during evaluation: PACU Anesthesia Type: General Level of consciousness: awake and alert Pain management: pain level controlled Vital Signs Assessment: post-procedure vital signs reviewed and stable Respiratory status: spontaneous breathing, nonlabored ventilation, respiratory function stable and patient connected to nasal cannula oxygen Cardiovascular status: blood pressure returned to baseline and stable Postop Assessment: no signs of nausea or vomiting Anesthetic complications: no Comments: Temperature maintained, did well, alert and oriented    Last Vitals:  Filed Vitals:   11/22/15 1045 11/22/15 1047  BP: 135/52 135/52  Pulse: 77 75  Temp:    Resp: 20 17    Last Pain: There were no vitals filed for this visit.               Chukwudi Ewen

## 2015-11-22 NOTE — Interval H&P Note (Signed)
History and Physical Interval Note:  11/22/2015 8:39 AM  [name]  has presented today for surgery, with the diagnosis of LEFT URETERAL, RENAL CALCULUS  The various methods of treatment have been discussed with the patient and family. After consideration of risks, benefits and other options for treatment, the patient has consented to  Procedure(s) with comments: CYSTOSCOPY WITH STENT REMOVAL (Left) CYSTOSCOPY/URETEROSCOPY/HOLMIUM LASER (Left) - FLEX URETEROSCOPY as a surgical intervention .  The patient's history has been reviewed, patient examined, no change in status, stable for surgery.  I have reviewed the patient's chart and labs.  Questions were answered to the patient's satisfaction.     Kennisha Qin S

## 2015-11-22 NOTE — H&P (View-Only) (Signed)
[name] is an 75 y.o. female.   Chief Complaint: Left abdominal and flank pain with nausea. Newly diagnosed 6 x 10 mm impacted left proximal ureteral stone HPI: [name] is currently 75 years of age. She has no significant prior urologic history. There is a family history of nephrolithiasis. She developed the fairly sudden onset of left lower quadrant abdominal pain this morning. She thought initially she might have diverticulitis. Her pain did radiate into her left flank. She had considerable nausea. Urinalysis was unremarkable. CT imaging revealed a 610 mm left proximal ureteral stone causing obstruction. She has had no fever or chills. She is currently afebrile. Her pain has persisted despite being here for 10 hours. The emergency room physician felt that she needed to have something more definitive. 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 Her most recent hemoglobin was just over 6. Her oncologist has recommended ongoing observation. She really takes no medication other than folic acid. She has an allergy to use shellfish.  Past Medical History  Diagnosis Date  . Cold agglutinin disease (HCC)     Past Surgical History  Procedure Laterality Date  . Tonsillectomy    . Appendectomy    . Abdominal hysterectomy    . Exploratory laparotomy    . Femur im nail Left 08/04/2015    Procedure: INTRAMEDULLARY (IM) NAIL FEMORAL;  Surgeon: Timothy D Murphy, MD;  Location: [facility] OR;  Service: Orthopedics;  Laterality: Left;    History reviewed. No pertinent family history. Social History:  reports that she quit smoking about 3 months ago. Her smoking use included Cigarettes. She smoked 1.00 pack per day. She does not have any smokeless tobacco history on file. She reports that she drinks alcohol. Her drug history is not on file.  Allergies:  Allergies  Allergen Reactions  . Shellfish Allergy Hives     (Not in  a hospital admission)  Results for orders placed or performed during the hospital encounter of 11/12/15 (from the past 48 hour(s))  Lipase, blood     Status: None   Collection Time: 11/12/15  3:06 PM  Result Value Ref Range   Lipase 20 11 - 51 U/L  Comprehensive metabolic panel     Status: Abnormal   Collection Time: 11/12/15  3:06 PM  Result Value Ref Range   Sodium 143 135 - 145 mmol/L   Potassium 2.9 (L) 3.5 - 5.1 mmol/L   Chloride 103 101 - 111 mmol/L   CO2 28 22 - 32 mmol/L   Glucose, Bld 139 (H) 65 - 99 mg/dL   BUN 16 6 - 20 mg/dL   Creatinine, Ser 0.62 0.44 - 1.00 mg/dL   Calcium 8.9 8.9 - 10.3 mg/dL   Total Protein 7.0 6.5 - 8.1 g/dL   Albumin 4.4 3.5 - 5.0 g/dL   AST 18 15 - 41 U/L   ALT 7 (L) 14 - 54 U/L   Alkaline Phosphatase 96 38 - 126 U/L   Total Bilirubin 3.2 (H) 0.3 - 1.2 mg/dL   GFR calc non Af Amer >60 >60 mL/min   GFR calc Af Amer >60 >60 mL/min    Comment: (NOTE) The eGFR has been calculated using the CKD EPI equation. This calculation has not been validated in all clinical situations. eGFR's persistently <60 mL/min signify possible Chronic Kidney Disease.    Anion gap 12 5 - 15  CBC     Status:   Abnormal   Collection Time: 11/12/15  3:06 PM  Result Value Ref Range   WBC 15.5 (H) 4.0 - 10.5 K/uL   RBC 1.30 (L) 3.87 - 5.11 MIL/uL   Hemoglobin 6.1 (LL) 12.0 - 15.0 g/dL    Comment: CRITICAL RESULT CALLED TO, READ BACK BY AND VERIFIED WITH: NJIHI,O RN AT 1746 ON 2.19.17 BY EPPERSON,S    HCT 16.4 (L) 36.0 - 46.0 %   MCV 126.2 (H) 78.0 - 100.0 fL   MCH 46.9 (H) 26.0 - 34.0 pg   MCHC 37.2 (H) 30.0 - 36.0 g/dL    Comment: CORRECTED FOR COLD AGGLUTININS   RDW NOT CALCULATED 11.5 - 15.5 %   Platelets 468 (H) 150 - 400 K/uL  Type and screen [facility]     Status: None (Preliminary result)   Collection Time: 11/12/15  3:06 PM  Result Value Ref Range   ABO/RH(D) PENDING    Antibody Screen PENDING    Sample Expiration 11/15/2015    Urinalysis, Routine w reflex microscopic (not at [facility])     Status: None   Collection Time: 11/12/15  3:58 PM  Result Value Ref Range   Color, Urine YELLOW YELLOW   APPearance CLEAR CLEAR   Specific Gravity, Urine 1.016 1.005 - 1.030   pH 6.0 5.0 - 8.0   Glucose, UA NEGATIVE NEGATIVE mg/dL   Hgb urine dipstick NEGATIVE NEGATIVE   Bilirubin Urine NEGATIVE NEGATIVE   Ketones, ur NEGATIVE NEGATIVE mg/dL   Protein, ur NEGATIVE NEGATIVE mg/dL   Nitrite NEGATIVE NEGATIVE   Leukocytes, UA NEGATIVE NEGATIVE    Comment: MICROSCOPIC NOT DONE ON URINES WITH NEGATIVE PROTEIN, BLOOD, LEUKOCYTES, NITRITE, OR GLUCOSE <1000 mg/dL.   Ct Abdomen Pelvis W Contrast  11/12/2015  CLINICAL DATA:  Severe left lower quadrant abdominal pain beginning today. History of diverticulitis. Diarrhea. EXAM: CT ABDOMEN AND PELVIS WITH CONTRAST TECHNIQUE: Multidetector CT imaging of the abdomen and pelvis was performed using the standard protocol following bolus administration of intravenous contrast. CONTRAST:  50mL OMNIPAQUE IOHEXOL 300 MG/ML SOLN, 80mL OMNIPAQUE IOHEXOL 300 MG/ML SOLN COMPARISON:  02/28/2009 FINDINGS: There are bilateral pleural effusions with dependent pulmonary atelectasis. There is probably early cirrhosis of the liver with relative prominence of the left lobe and caudate compared to the right. Sub cm cyst in the right lobe. There are several small gallstones dependent in the gallbladder. No CT evidence of cholecystitis. Mild periportal edema probably relates to a relative right heart failure. The spleen is normal. The pancreas is atrophic. The adrenal glands are normal. The right kidney is normal. The left kidney is swollen and there is hydronephrosis due to a stone impacted at the proximal ureter at the level of the lower pole of the kidney. This stone shows a length of 1 cm with a diameter of 6 mm. No other urinary tract stone. The aorta and its branch vessels show extensive atherosclerosis. No aneurysm. No  retroperitoneal mass or lymphadenopathy. No free intraperitoneal fluid or air. Patient has chronic diverticulosis but there is no evidence of diverticulitis at this time. There is an old compression deformity of T11. IMPRESSION: Acute hydronephrosis on the left due to a 10 x 6 mm stone impacted in the left ureter at the level of the lower pole of the left kidney. Diverticulosis without evidence of diverticulitis. 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 Gallstones without evidence of cholecystitis or obstruction. Bilateral pleural effusions and dependent pulmonary atelectasis. Electronically Signed   By: Mark  Shogry M.D.     On: 11/12/2015 19:37    Review of Systems - Negative except abdominal, back pain and nausea. No gross hematuria. No other voiding complaints. Otherwise negative.  Blood pressure 145/63, pulse 78, temperature 97.5 F (36.4 C), temperature source Oral, resp. rate 16, SpO2 100 %. General appearance: alert, cooperative and no distress Neck: no adenopathy and no JVD Resp: clear to auscultation bilaterally Cardio: regular rate and rhythm GI: soft, non-tender; bowel sounds normal; no masses,  no organomegaly Extremities: extremities normal, atraumatic, no cyanosis or edema Skin: Skin color, texture, turgor normal. No rashes or lesions Neurologic: Grossly normal  Assessment/Plan Impacted obstructing 610 mm left proximal ureteral stone. Patient has been in the ER for 10 hours with ongoing pain and nausea. I recommended temporary JJ stenting to alleviate the obstruction. We will then review her images and plain x-rays in detail to determine stone density and visualization. We can then discuss with her at length the advantages and disadvantages of ESWL versus ureteroscopy with holmium laser lithotripsy at a later date for definitive stone management. We will consult with anesthesiology with regard to whether the patient needs a transfusion. We would not expect any blood loss with our procedure  tonight. There is not been to be anybody at home that we'll be able to care for her this evening and therefore she will require overnight admission for observation. We can have her oncologist consult in the morning if necessary.  Maquita Sandoval S 11/12/2015, 9:12 PM  

## 2015-11-22 NOTE — Anesthesia Procedure Notes (Signed)
Procedure Name: LMA Insertion Date/Time: 11/22/2015 9:38 AM Performed by: Justice Rocher Pre-anesthesia Checklist: Patient identified, Emergency Drugs available, Suction available and Patient being monitored Patient Re-evaluated:Patient Re-evaluated prior to inductionOxygen Delivery Method: Circle System Utilized Preoxygenation: Pre-oxygenation with 100% oxygen Intubation Type: IV induction Ventilation: Mask ventilation without difficulty LMA: LMA inserted LMA Size: 3.0 Number of attempts: 1 Airway Equipment and Method: Bite block Placement Confirmation: positive ETCO2 Tube secured with: Tape Dental Injury: Teeth and Oropharynx as per pre-operative assessment

## 2015-11-22 NOTE — Anesthesia Postprocedure Evaluation (Signed)
Anesthesia Post Note  Patient: [name]  Procedure(s) Performed: Procedure(s) (LRB): CYSTOSCOPY WITH STENT REMOVAL (Left) CYSTOSCOPY/URETEROSCOPY/HOLMIUM LASER (Left)  Anesthesia Post Evaluation  Last Vitals:  Filed Vitals:   11/22/15 0810  BP: 145/55  Pulse: 61  Temp: 36.9 C  Resp: 14    Last Pain: There were no vitals filed for this visit.               Isidra Mings

## 2015-11-22 NOTE — Op Note (Signed)
Preoperative diagnosis: Left renal calculus Postoperative diagnosis: Same  Procedure: Cystoscopy, removal of left double-J stent, flexible left ureteroscopy, holmium laser lithotripsy with basketing of fragments   Surgeon: Bernestine Amass M.D.  Anesthesia: Gen.  Indications: Patient presented with her initial stone event recently. She had severe unremitting left renal colic. She was diagnosed with a left proximal ureteral stone. The patient underwent a urgent retrograde pyelogram which revealed high-grade obstruction from a left proximal stone. Double-J stent was placed. This was performed approximately a week ago. On follow-up imaging the stone was really quite faint and difficult to see on KUB. It appeared to be in a midpole calyx. We suggested ureteroscopy as a more definitive procedure. Risks and benefits of this was discussed with her. She presents now for definitive management of the stone.     Technique and findings: Patient was brought the operating room where she had successful induction of general anesthesia. Because of her history of cold agglutinin disease we made a special effort to keep the room as warm as possible and to use warming techniques. Appropriate surgical timeout was performed. She was in lithotomy position was prepped and draped in the usual manner. Cystoscopy revealed a well-positioned left double-J stent. This was partially removed and a guidewire placed up to the left renal pelvis. An access sheath was then placed. A digital flexible ureteroscope was inserted and the stone was encountered in a midpole calyx. The stone appeared to be about 8 x 6 mm in size. Holmium laser lithotripter was utilized with a 200  fiber. Energy settings were 0.8 J 5 Hz. The stone was broken into approximately a dozen pieces of the largest 6 or 8 were basket extracted. We elected to remove her access sheath. Given her prior double-J stent placement we did not feel reinsertion was necessary. The ureter  otherwise appeared to have dilated reasonably well with the double-J stent. The bladder was drained. She was brought to PACU in stable condition having had no obvious complications or problems.

## 2015-11-22 NOTE — Discharge Instructions (Signed)
[facility] [phone] Post Ureteroscopy With or Without Stent Instructions  Definitions:  Ureter: The duct that transports urine from the kidney to the bladder. Stent:   A plastic hollow tube that is placed into the ureter, from the kidney to the bladder to prevent the ureter from swelling shut.  GENERAL INSTRUCTIONS:  Despite the fact that no skin incisions were used, the area around the ureter and bladder is raw and irritated. The stent is a foreign body which will further irritate the bladder wall. This irritation is manifested by increased frequency of urination, both day and night, and by an increase in the urge to urinate. In some, the urge to urinate is present almost always. Sometimes the urge is strong enough that you may not be able to stop yourself from urinating. The only real cure is to remove the stent and then give time for the bladder wall to heal which can't be done until the danger of the ureter swelling shut has passed, which varies.  You may see some blood in your urine while the stent is in place and a few days afterwards. Do not be alarmed, even if the urine was clear for a while. Get off your feet and drink lots of fluids until clearing occurs. If you start to pass clots or don't improve, call us.  DIET: You may return to your normal diet immediately. Because of the raw surface of your bladder, alcohol, spicy foods, acid type foods and drinks with caffeine may cause irritation or frequency and should be used in moderation. To keep your urine flowing freely and to avoid constipation, drink plenty of fluids during the day ( 8-10 glasses ). Tip: Avoid cranberry juice because it is very acidic.  ACTIVITY: Your physical activity doesn't need to be restricted. However, if you are very active, you may see some blood in your urine. We suggest that you reduce your activity under these circumstances until the bleeding has stopped.  BOWELS: It is important to  keep your bowels regular during the postoperative period. Straining with bowel movements can cause bleeding. A bowel movement every other day is reasonable. Use a mild laxative if needed, such as Milk of Magnesia 2-3 tablespoons, or 2 Dulcolax tablets. Call if you continue to have problems. If you have been taking narcotics for pain, before, during or after your surgery, you may be constipated. Take a laxative if necessary.   MEDICATION: You should resume your pre-surgery medications unless told not to. In addition you will often be given an antibiotic to prevent infection. These should be taken as prescribed until the bottles are finished unless you are having an unusual reaction to one of the drugs.  PROBLEMS YOU SHOULD REPORT TO [place]:  Fevers over 100.5 Fahrenheit.  Heavy bleeding, or clots ( See above notes about blood in urine ).  Inability to urinate.  Drug reactions ( hives, rash, nausea, vomiting, diarrhea ).  Severe burning or pain with urination that is not improving.  FOLLOW-UP: You will need a follow-up appointment to monitor your progress. Call for this appointment at the number listed above. Usually the first appointment will be about 2-3 weeks after your surgery    Post Anesthesia Home Care Instructions  Activity: Get plenty of rest for the remainder of the day. A responsible adult should stay with you for 24 hours following the procedure.  For the next 24 hours, DO NOT: -Drive a car -Paediatric nurse -Drink alcoholic beverages -Take any medication unless instructed  by your physician -Make any legal decisions or sign important papers.  Meals: Start with liquid foods such as gelatin or soup. Progress to regular foods as tolerated. Avoid greasy, spicy, heavy foods. If nausea and/or vomiting occur, drink only clear liquids until the nausea and/or vomiting subsides. Call your physician if vomiting continues.  Special Instructions/Symptoms: Your throat may feel dry or  sore from the anesthesia or the breathing tube placed in your throat during surgery. If this causes discomfort, gargle with warm salt water. The discomfort should disappear within 24 hours.  If you had a scopolamine patch placed behind your ear for the management of post- operative nausea and/or vomiting:  1. The medication in the patch is effective for 72 hours, after which it should be removed.  Wrap patch in a tissue and discard in the trash. Wash hands thoroughly with soap and water. 2. You may remove the patch earlier than 72 hours if you experience unpleasant side effects which may include dry mouth, dizziness or visual disturbances. 3. Avoid touching the patch. Wash your hands with soap and water after contact with the patch.

## 2015-11-22 NOTE — Transfer of Care (Signed)
Immediate Anesthesia Transfer of Care Note  Patient: [name]  Procedure(s) Performed: Procedure(s) (LRB): CYSTOSCOPY WITH STENT REMOVAL (Left) CYSTOSCOPY/URETEROSCOPY/HOLMIUM LASER (Left)  Patient Location: PACU  Anesthesia Type: General  Level of Consciousness: awake, sedated, patient cooperative and responds to stimulation  Airway & Oxygen Therapy: Patient Spontanous Breathing and Patient connected to face mask oxygen  Post-op Assessment: Report given to PACU RN, Post -op Vital signs reviewed and stable and Patient moving all extremities  Post vital signs: Reviewed and stable  Complications: No apparent anesthesia complications

## 2015-11-23 ENCOUNTER — Encounter (HOSPITAL_BASED_OUTPATIENT_CLINIC_OR_DEPARTMENT_OTHER): Payer: Self-pay | Admitting: Urology

## 2016-02-26 ENCOUNTER — Other Ambulatory Visit: Payer: Medicare Other

## 2016-02-26 ENCOUNTER — Ambulatory Visit: Payer: Medicare Other | Admitting: Oncology

## 2016-02-27 ENCOUNTER — Other Ambulatory Visit (HOSPITAL_COMMUNITY): Payer: Self-pay | Admitting: Internal Medicine

## 2016-02-27 DIAGNOSIS — R609 Edema, unspecified: Secondary | ICD-10-CM

## 2016-02-28 ENCOUNTER — Ambulatory Visit (HOSPITAL_COMMUNITY)
Admission: RE | Admit: 2016-02-28 | Discharge: 2016-02-28 | Disposition: A | Discharge status: Home / Self Care | Payer: Medicare Other | Source: Ambulatory Visit | Attending: Internal Medicine | Admitting: Internal Medicine

## 2016-02-28 DIAGNOSIS — R609 Edema, unspecified: Secondary | ICD-10-CM | POA: Diagnosis not present

## 2016-02-28 NOTE — Progress Notes (Signed)
VASCULAR LAB PRELIMINARY  PRELIMINARY  PRELIMINARY  PRELIMINARY  Left lower extremity venous duplex completed.    Preliminary report:  Left:  No evidence of DVT, superficial thrombosis, or Baker's cyst. Moderate interstitial fluid noted in the mid to distal lower leg and foot  Oleva Koo, RVS 02/28/2016, 11:01 AM

## 2016-02-29 ENCOUNTER — Other Ambulatory Visit: Payer: Self-pay | Admitting: Oncology

## 2016-02-29 DIAGNOSIS — D5912 Cold autoimmune hemolytic anemia: Secondary | ICD-10-CM

## 2016-02-29 DIAGNOSIS — D591 Other autoimmune hemolytic anemias: Principal | ICD-10-CM

## 2016-03-05 NOTE — Telephone Encounter (Signed)
Livesay patient.  Routed to BC1 pod

## 2016-03-07 ENCOUNTER — Other Ambulatory Visit: Payer: Self-pay | Admitting: Oncology

## 2016-03-08 ENCOUNTER — Telehealth: Payer: Self-pay | Admitting: Oncology

## 2016-03-08 NOTE — Telephone Encounter (Signed)
R/s pt July appt to Aug per LL 6/15 pof. lvm to inform patient of new appt date/time and sent letter by mail

## 2016-03-18 ENCOUNTER — Encounter: Payer: Self-pay | Admitting: Oncology

## 2016-03-18 NOTE — Progress Notes (Signed)
Medical Oncology  Labs received from [facility] 03-04-16 and will be scanned into this EMR.  Summary: Serum iron 228, sat 84, TIBC 271 T prot 6.9 Alb 4.4 AP 98 AST 12, ALT 6 Tbil 2.7 Dbil 0.4 Ibil 2.3  L.Marko Plume, MD

## 2016-04-04 ENCOUNTER — Ambulatory Visit: Payer: Medicare Other | Admitting: Oncology

## 2016-04-04 ENCOUNTER — Other Ambulatory Visit: Payer: Medicare Other

## 2016-05-19 ENCOUNTER — Other Ambulatory Visit: Payer: Self-pay | Admitting: Oncology

## 2016-05-19 DIAGNOSIS — D5912 Cold autoimmune hemolytic anemia: Secondary | ICD-10-CM

## 2016-05-19 DIAGNOSIS — D591 Other autoimmune hemolytic anemias: Principal | ICD-10-CM

## 2016-05-23 ENCOUNTER — Encounter: Payer: Self-pay | Admitting: Oncology

## 2016-05-23 ENCOUNTER — Ambulatory Visit (HOSPITAL_BASED_OUTPATIENT_CLINIC_OR_DEPARTMENT_OTHER): Payer: Medicare Other

## 2016-05-23 ENCOUNTER — Telehealth: Payer: Self-pay | Admitting: Oncology

## 2016-05-23 ENCOUNTER — Ambulatory Visit (HOSPITAL_BASED_OUTPATIENT_CLINIC_OR_DEPARTMENT_OTHER): Payer: Medicare Other | Admitting: Oncology

## 2016-05-23 VITALS — BP 131/45 | HR 61 | Temp 98.3°F | Resp 17 | Ht 62.0 in | Wt 97.1 lb

## 2016-05-23 DIAGNOSIS — J90 Pleural effusion, not elsewhere classified: Secondary | ICD-10-CM

## 2016-05-23 DIAGNOSIS — F17211 Nicotine dependence, cigarettes, in remission: Secondary | ICD-10-CM | POA: Diagnosis not present

## 2016-05-23 DIAGNOSIS — D591 Other autoimmune hemolytic anemias: Secondary | ICD-10-CM

## 2016-05-23 DIAGNOSIS — D5912 Cold autoimmune hemolytic anemia: Secondary | ICD-10-CM

## 2016-05-23 DIAGNOSIS — R140 Abdominal distension (gaseous): Secondary | ICD-10-CM

## 2016-05-23 DIAGNOSIS — N200 Calculus of kidney: Secondary | ICD-10-CM | POA: Diagnosis not present

## 2016-05-23 DIAGNOSIS — R1012 Left upper quadrant pain: Secondary | ICD-10-CM

## 2016-05-23 LAB — CBC WITH DIFFERENTIAL/PLATELET
BASO%: 0.4 % (ref 0.0–2.0)
BASOS ABS: 0.1 10*3/uL (ref 0.0–0.1)
EOS%: 0.9 % (ref 0.0–7.0)
Eosinophils Absolute: 0.1 10*3/uL (ref 0.0–0.5)
HEMATOCRIT: 21.5 % — AB (ref 34.8–46.6)
HEMOGLOBIN: 7.4 g/dL — AB (ref 11.6–15.9)
LYMPH%: 41.4 % (ref 14.0–49.7)
MCH: 36.8 pg — AB (ref 25.1–34.0)
MCHC: 34.4 g/dL (ref 31.5–36.0)
MCV: 107 fL — AB (ref 79.5–101.0)
MONO#: 0.7 10*3/uL (ref 0.1–0.9)
MONO%: 5.6 % (ref 0.0–14.0)
NEUT#: 6.4 10*3/uL (ref 1.5–6.5)
NEUT%: 51.7 % (ref 38.4–76.8)
Platelets: 469 10*3/uL — ABNORMAL HIGH (ref 145–400)
RBC: 2.01 10*6/uL — ABNORMAL LOW (ref 3.70–5.45)
RDW: 15.3 % — ABNORMAL HIGH (ref 11.2–14.5)
WBC: 12.3 10*3/uL — ABNORMAL HIGH (ref 3.9–10.3)
lymph#: 5.1 10*3/uL — ABNORMAL HIGH (ref 0.9–3.3)
nRBC: 0 % (ref 0–0)

## 2016-05-23 NOTE — Progress Notes (Signed)
OFFICE PROGRESS NOTE   May 23, 2016   Physicians: Janeice Robinson (PCP), M.Magod, D.Grapey  INTERVAL HISTORY:  Patient is seen, alone for visit, in continuing attention to cold agglutinin disease, for which she continues daily folate supplement and cold avoidance, has not wanted to pursue other therapy.  She has regular visits with PCP, next in 09-2016  Recent medical problems include left hip fracture in fall 07-2015. She mentions chronic discomfort left lower ribs laterally since the fall and feeling of abdominal fullness also since the hip fracture, with CT abdomen 10-2015 done due to left renal calculus with small bilateral pleural effusions and no other findings that seem to correlate with those symptoms. She was hospitalized in 10-2015 for the nephrolithiasis.  Labs from Dr Watt Climes as previously noted 02-2016.  She smoked heavily for years prior to stopping 07-2015 at time of the hip fracture.  Appetite is at baseline. She has no RUQ discomfort suggesting symptomatic cholelithiasis. She denies changes in bowels, any bleeding, LE swelling, increased SOB or other respiratory symptoms. No fever or symptoms of infection. Energy at baseline. She is living with 12+ yo mother, who keeps house much warmer than patient's home previously, seems to be helpful with the cold agglutinin Remainder of 10 point Review of Systems negative  ONCOLOGIC HISTORY Idiopathic cold agglutinin disease. Slightly low B12 06-2012, on supplemental B12  Objective:  Vital signs in last 24 hours:  BP (!) 131/45 (BP Location: Left Arm, Patient Position: Sitting)   Pulse 61   Temp 98.3 F (36.8 C) (Oral)   Resp 17   Ht 5\' 2"  (1.575 m)   Wt 97 lb 1.6 oz (44 kg)   SpO2 92%   BMI 17.76 kg/m  Weight up 3 lbs. Elderly, small lady looks comfortable, respirations not labored. Alert, oriented and appropriate. Ambulatory without difficulty.    HEENT:PERRL, sclerae not icteric. Oral mucosa moist without lesions, posterior  pharynx clear.  Neck supple. No JVD.  Lymphatics:no cervical,supraclavicular adenopathy Resp: diminished BS thruout otherwise clear to auscultation bilaterally and no dullness to percussion bilaterally Cardio: regular rate and rhythm. No gallop. GI: soft, nontender, not obviously distended, no mass or organomegaly. Normally active bowel sounds. . Musculoskeletal/ Extremities: without pitting edema, cords, tenderness. Hands cool Neuro: no peripheral neuropathy. Otherwise nonfocal Skin without rash, ecchymosis, petechiae   Lab Results:  Results for orders placed or performed in visit on 05/23/16  CBC with Differential  Result Value Ref Range   WBC 12.3 (H) 3.9 - 10.3 10e3/uL   NEUT# 6.4 1.5 - 6.5 10e3/uL   HGB 7.4 (L) 11.6 - 15.9 g/dL   HCT 21.5 (L) 34.8 - 46.6 %   Platelets 469 (H) 145 - 400 10e3/uL   MCV 107.0 (H) 79.5 - 101.0 fL   MCH 36.8 (H) 25.1 - 34.0 pg   MCHC 34.4 31.5 - 36.0 g/dL   RBC 2.01 (L) 3.70 - 5.45 10e6/uL   RDW 15.3 (H) 11.2 - 14.5 %   lymph# 5.1 (H) 0.9 - 3.3 10e3/uL   MONO# 0.7 0.1 - 0.9 10e3/uL   Eosinophils Absolute 0.1 0.0 - 0.5 10e3/uL   Basophils Absolute 0.1 0.0 - 0.1 10e3/uL   NEUT% 51.7 38.4 - 76.8 %   LYMPH% 41.4 14.0 - 49.7 %   MONO% 5.6 0.0 - 14.0 %   EOS% 0.9 0.0 - 7.0 %   BASO% 0.4 0.0 - 2.0 %   nRBC 0 0 - 0 %    Labs from [facility] 03-04-16:  Iron 228, %sat 84, prot 6.9, alb 4.4, T bili 2.7, D bili 0.4  Studies/Results:  No results found.  Medications: I have reviewed the patient's current medications. Continue folate 1 mg daily indefinitely She will get flu vaccine this fall  DISCUSSION Interval history reviewed.  With small bilateral pleural effusions on CT with the renal stone 10-2015 and patient's concerns,  Will get CXR and abd [place] in next week or so, and let her know results.  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 Has recovered well. 5.left nephrolithiasis 10-2015, treated by Dr Risa Grill 6.dense breast tissue: best to get tomo mammograms ongoing 7.history diverticular disease 8. Small pleural effusions by CT with the acute nephrolithiasis 10-2015, nothing on that scan correlating with abdominal fullness or left lower rib soreness. Will repeat CXR and abd [place], and will need to let her know results.   All questions answered. Time spent 20 min including >50% counseling and coordination of care. Route Dr Mancel Bale.     Evlyn Clines, MD   05/23/2016, 7:27 PM

## 2016-05-23 NOTE — Telephone Encounter (Signed)
appt made and avs printed °

## 2016-06-12 ENCOUNTER — Ambulatory Visit (HOSPITAL_COMMUNITY)
Admission: RE | Admit: 2016-06-12 | Discharge: 2016-06-12 | Disposition: A | Discharge status: Home / Self Care | Payer: Medicare Other | Source: Ambulatory Visit | Attending: Oncology | Admitting: Oncology

## 2016-06-12 ENCOUNTER — Other Ambulatory Visit: Payer: Self-pay | Admitting: Oncology

## 2016-06-12 ENCOUNTER — Telehealth: Payer: Self-pay

## 2016-06-12 DIAGNOSIS — K8020 Calculus of gallbladder without cholecystitis without obstruction: Secondary | ICD-10-CM | POA: Diagnosis not present

## 2016-06-12 DIAGNOSIS — R140 Abdominal distension (gaseous): Secondary | ICD-10-CM

## 2016-06-12 DIAGNOSIS — R1012 Left upper quadrant pain: Secondary | ICD-10-CM | POA: Diagnosis not present

## 2016-06-12 DIAGNOSIS — I700 Atherosclerosis of aorta: Secondary | ICD-10-CM | POA: Diagnosis not present

## 2016-06-12 DIAGNOSIS — R932 Abnormal findings on diagnostic imaging of liver and biliary tract: Secondary | ICD-10-CM | POA: Insufficient documentation

## 2016-06-12 NOTE — Telephone Encounter (Signed)
Told [name] the results of CXR and  Abdominal US as noted below by Dr. Marko Plume. Elaine Piersol verbalized understanding.

## 2016-06-12 NOTE — Telephone Encounter (Signed)
-----   Message from Gordy Levan, MD sent at 06/12/2016 11:36 AM EDT ----- Labs seen and need follow up: please let her know CXR does not show fluid around lungs now. Abdominal US does not show anything obvious for abdominal fullness - no fluid, probably gallstones, no kidney stones seen, liver may have some scarring/ cirrhotic features.  I will send reports to PCP, who can decide if anything further needed.

## 2016-09-02 MED ORDER — FOLIC ACID 1 MG PO TABS: 1.0000 mg | ORAL_TABLET | Freq: Every day | ORAL | 1 refills | Status: DC

## 2016-09-12 ENCOUNTER — Other Ambulatory Visit: Payer: Self-pay | Admitting: Oncology

## 2016-09-12 DIAGNOSIS — D591 Autoimmune hemolytic anemia, unspecified: Secondary | ICD-10-CM

## 2016-09-12 DIAGNOSIS — D5912 Cold autoimmune hemolytic anemia: Secondary | ICD-10-CM

## 2016-09-20 ENCOUNTER — Other Ambulatory Visit: Payer: Self-pay | Admitting: Medical Oncology

## 2016-09-20 ENCOUNTER — Telehealth: Payer: Self-pay | Admitting: Medical Oncology

## 2016-09-20 DIAGNOSIS — D591 Autoimmune hemolytic anemia, unspecified: Secondary | ICD-10-CM

## 2016-09-20 DIAGNOSIS — D5912 Cold autoimmune hemolytic anemia: Secondary | ICD-10-CM

## 2016-09-20 MED ORDER — CYANOCOBALAMIN ER 1000 MCG PO TBCR
EXTENDED_RELEASE_TABLET | ORAL | 4 refills | Status: DC
Start: 2016-09-20 — End: 2017-05-22

## 2016-09-20 NOTE — Telephone Encounter (Signed)
Did not receive escript on 12/5  for vit b so I called it in with 4 refills.

## 2016-10-16 ENCOUNTER — Other Ambulatory Visit: Payer: Self-pay | Admitting: Internal Medicine

## 2016-10-16 DIAGNOSIS — Z1231 Encounter for screening mammogram for malignant neoplasm of breast: Secondary | ICD-10-CM

## 2016-10-25 ENCOUNTER — Telehealth: Payer: Self-pay | Admitting: *Deleted

## 2016-10-25 NOTE — Telephone Encounter (Signed)
"  I need to speak with Dr. Mariana Kaufman nurse.  I need to know if she has ever checked a vitamin B12 level.  My PCP, Dr. [name] asked.  He'd like these results if she has ever checked this.  I also never received my next appointment when I was seen last."  Provided appointment information and new provider name explaining Dr. Marko Plume retired yesterday.  Answered question if provider is female or female.  No further questions.

## 2016-10-30 ENCOUNTER — Telehealth: Payer: Self-pay

## 2016-10-30 NOTE — Telephone Encounter (Signed)
Pt states she has new PCP and he is requesting B12 labs. Pt given numbers and labs routed to Dr [name].

## 2016-11-12 ENCOUNTER — Ambulatory Visit
Admission: RE | Admit: 2016-11-12 | Discharge: 2016-11-12 | Disposition: A | Discharge status: Home / Self Care | Payer: Medicare Other | Source: Ambulatory Visit | Attending: Internal Medicine | Admitting: Internal Medicine

## 2016-11-12 DIAGNOSIS — Z1231 Encounter for screening mammogram for malignant neoplasm of breast: Secondary | ICD-10-CM

## 2016-11-26 ENCOUNTER — Other Ambulatory Visit: Payer: Self-pay | Admitting: Dermatology

## 2017-02-03 ENCOUNTER — Other Ambulatory Visit: Payer: Self-pay | Admitting: Oncology

## 2017-02-03 DIAGNOSIS — D591 Other autoimmune hemolytic anemias: Principal | ICD-10-CM

## 2017-02-03 DIAGNOSIS — D5912 Cold autoimmune hemolytic anemia: Secondary | ICD-10-CM

## 2017-03-04 ENCOUNTER — Other Ambulatory Visit: Payer: Self-pay | Admitting: Dermatology

## 2017-05-21 ENCOUNTER — Other Ambulatory Visit: Payer: Self-pay

## 2017-05-21 DIAGNOSIS — D591 Other autoimmune hemolytic anemias: Principal | ICD-10-CM

## 2017-05-21 DIAGNOSIS — D5912 Cold autoimmune hemolytic anemia: Secondary | ICD-10-CM

## 2017-05-22 ENCOUNTER — Other Ambulatory Visit (HOSPITAL_BASED_OUTPATIENT_CLINIC_OR_DEPARTMENT_OTHER): Payer: Medicare Other

## 2017-05-22 ENCOUNTER — Encounter: Payer: Self-pay | Admitting: Hematology

## 2017-05-22 ENCOUNTER — Ambulatory Visit (HOSPITAL_BASED_OUTPATIENT_CLINIC_OR_DEPARTMENT_OTHER): Payer: Medicare Other | Admitting: Hematology

## 2017-05-22 ENCOUNTER — Telehealth: Payer: Self-pay | Admitting: Hematology

## 2017-05-22 VITALS — BP 141/49 | HR 61 | Temp 98.5°F | Resp 18 | Wt 98.1 lb

## 2017-05-22 DIAGNOSIS — D472 Monoclonal gammopathy: Secondary | ICD-10-CM

## 2017-05-22 DIAGNOSIS — D591 Autoimmune hemolytic anemia, unspecified: Secondary | ICD-10-CM

## 2017-05-22 DIAGNOSIS — E876 Hypokalemia: Secondary | ICD-10-CM | POA: Diagnosis not present

## 2017-05-22 DIAGNOSIS — D5912 Cold autoimmune hemolytic anemia: Secondary | ICD-10-CM

## 2017-05-22 LAB — CBC & DIFF AND RETIC
BASO%: 0.5 % (ref 0.0–2.0)
Basophils Absolute: 0.1 10*3/uL (ref 0.0–0.1)
EOS ABS: 0.1 10*3/uL (ref 0.0–0.5)
EOS%: 0.6 % (ref 0.0–7.0)
HCT: 23.7 % — ABNORMAL LOW (ref 34.8–46.6)
HEMOGLOBIN: 7.9 g/dL — AB (ref 11.6–15.9)
Immature Retic Fract: 11.7 % — ABNORMAL HIGH (ref 1.60–10.00)
LYMPH%: 33.8 % (ref 14.0–49.7)
MCH: 36.2 pg — AB (ref 25.1–34.0)
MCHC: 33.3 g/dL (ref 31.5–36.0)
MCV: 108.7 fL — AB (ref 79.5–101.0)
MONO#: 0.6 10*3/uL (ref 0.1–0.9)
MONO%: 5.2 % (ref 0.0–14.0)
NEUT%: 59.9 % (ref 38.4–76.8)
NEUTROS ABS: 6.6 10*3/uL — AB (ref 1.5–6.5)
PLATELETS: 464 10*3/uL — AB (ref 145–400)
RBC: 2.18 10*6/uL — ABNORMAL LOW (ref 3.70–5.45)
RDW: 15.2 % — ABNORMAL HIGH (ref 11.2–14.5)
Retic %: 6.22 % — ABNORMAL HIGH (ref 0.70–2.10)
Retic Ct Abs: 135.6 10*3/uL — ABNORMAL HIGH (ref 33.70–90.70)
WBC: 11 10*3/uL — AB (ref 3.9–10.3)
lymph#: 3.7 10*3/uL — ABNORMAL HIGH (ref 0.9–3.3)

## 2017-05-22 LAB — COMPREHENSIVE METABOLIC PANEL
ALBUMIN: 4.1 g/dL (ref 3.5–5.0)
ALK PHOS: 117 U/L (ref 40–150)
ALT: 7 U/L (ref 0–55)
AST: 16 U/L (ref 5–34)
Anion Gap: 7 mEq/L (ref 3–11)
BUN: 9.4 mg/dL (ref 7.0–26.0)
CALCIUM: 9.1 mg/dL (ref 8.4–10.4)
CO2: 31 mEq/L — ABNORMAL HIGH (ref 22–29)
CREATININE: 0.7 mg/dL (ref 0.6–1.1)
Chloride: 104 mEq/L (ref 98–109)
EGFR: 86 mL/min/{1.73_m2} — ABNORMAL LOW (ref >90)
GLUCOSE: 107 mg/dL (ref 70–140)
Potassium: 2.9 mEq/L — CL (ref 3.5–5.1)
Sodium: 142 mEq/L (ref 136–145)
TOTAL PROTEIN: 6.9 g/dL (ref 6.4–8.3)
Total Bilirubin: 2.64 mg/dL — ABNORMAL HIGH (ref 0.20–1.20)

## 2017-05-22 LAB — CHCC SMEAR

## 2017-05-22 LAB — LACTATE DEHYDROGENASE: LDH: 254 U/L — AB (ref 125–245)

## 2017-05-22 MED ORDER — FOLIC ACID 1 MG PO TABS: 1.0000 mg | ORAL_TABLET | Freq: Every day | ORAL | 11 refills | Status: DC

## 2017-05-22 MED ORDER — CYANOCOBALAMIN ER 1000 MCG PO TBCR: EXTENDED_RELEASE_TABLET | ORAL | 11 refills | Status: DC

## 2017-05-22 MED ORDER — POTASSIUM CHLORIDE CRYS ER 20 MEQ PO TBCR: 20.0000 meq | EXTENDED_RELEASE_TABLET | Freq: Two times a day (BID) | ORAL | 0 refills | Status: DC

## 2017-05-22 NOTE — Telephone Encounter (Signed)
Gave patient AVS and calendar of upcoming January appointments °

## 2017-05-22 NOTE — Progress Notes (Signed)
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 Marko Plume. Patient was last seen by Dr. Marko Plume on 05/23/2016. She has not received needed any definitive pharmacologic intervention until this time. Her hemoglobin has varied in the 6-8 range. Typically tends to be in the low 6 range in winter and in the 7-8 range during summer months. Hemoglobin today is up to 7.9 with an MCV of 108.7 up from 7.4 a year ago. Increased reticulocytosis as expected. Patient continues to be on chronic folic acid replacement. Patient has WBC count of 11k with somewhat increased platelet counts of 464k which has been chronic as well. LDH still mildly elevated at 254. Has not recently needed any PRBC transfusions. Note some fatigue but this hasn't changed significantly. Has had chronic issues with hypokalemia. Potassium level today is down to 2.9 similar to last time. She was prescribed potassium replacement and recommended to follow with her primary care physician.  REVIEW OF SYSTEMS:    10 Point review of systems of done and is negative except as noted above.  . Past Medical History:  Diagnosis Date  . B12 deficiency   . Bilateral carotid artery stenosis    right CCA and ICA 1-39%/    left ICA 40-59%  per last duplex  10-19-2015  . COPD (chronic obstructive pulmonary disease) ([place])   . Diverticulosis of colon   . History of diverticulitis of colon    2004  . 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 Renal and ureteric calculus    left    . Past Surgical History:  Procedure Laterality Date  . ABDOMINAL HYSTERECTOMY  1986  . APPENDECTOMY  1967  . BREAST  EXCISIONAL BIOPSY Right 1984  . CATARACT EXTRACTION W/ INTRAOCULAR LENS IMPLANT Right 1990's   congential cataract  . CYSTOSCOPY W/ URETERAL STENT PLACEMENT  11/12/2015   Procedure: CYSTOSCOPY WITH RETROGRADE PYELOGRAM/URETERAL STENT PLACEMENT;  Surgeon: [name], MD;  Location: [facility];  Service: Urology;;  . Consuela Mimes W/ URETERAL STENT REMOVAL Left 11/22/2015   Procedure: CYSTOSCOPY WITH STENT REMOVAL;  Surgeon: [name], MD;  Location: [facility];  Service: Urology;  Laterality: Left;  . CYSTOSCOPY/URETEROSCOPY/HOLMIUM LASER Left 11/22/2015   Procedure: CYSTOSCOPY/URETEROSCOPY/HOLMIUM LASER;  Surgeon: [name], MD;  Location: [facility];  Service: Urology;  Laterality: Left;  FLEX URETEROSCOPY  . FEMUR IM NAIL Left 08/04/2015   Procedure: INTRAMEDULLARY (IM) NAIL FEMORAL;  Surgeon: [name], MD;  Location: [place];  Service: Orthopedics;  Laterality: Left;  . TONSILLECTOMY  age 25    . Social History  Substance Use Topics  . Smoking status: Former Smoker    Packs/day: 1.00    Types: Cigarettes    Quit date: 08/03/2015  . Smokeless tobacco: Never Used  . Alcohol use Yes     Comment: occ    ALLERGIES:  is allergic to other and shellfish allergy.  MEDICATIONS:  Current Outpatient Prescriptions  Medication Sig Dispense Refill  . acetaminophen (TYLENOL) 325 MG tablet Take 2 tablets (650 mg total) by mouth every 6 (six) hours as needed for mild pain (or Fever >/= 101).    Marland Kitchen  Cyanocobalamin ([employer] B-12) 1000 MCG TBCR TAKE 1 TABLET DAILY 30 tablet 11  . docusate sodium (COLACE) 100 MG capsule Take 1 capsule (100 mg total) by mouth daily.    . folic acid (FOLVITE) 1 MG tablet Take 1 tablet (1 mg total) by mouth daily. 90 tablet 11  . nicotine (NICODERM CQ - DOSED IN MG/24 HOURS) 21 mg/24hr patch Place 1 patch (21 mg total) onto the skin daily. 28 patch 0  . potassium chloride SA (K-DUR,KLOR-CON) 20 MEQ tablet Take 1 tablet (20 mEq total) by mouth  2 (two) times daily. F/u with PCP in 10-14 days to determine need for ongoing replacement 20 tablet 0   No current facility-administered medications for this visit.     PHYSICAL EXAMINATION: ECOG PERFORMANCE STATUS: 1 - Symptomatic but completely ambulatory  . Vitals:   05/22/17 1302  BP: (!) 141/49  Pulse: 61  Resp: 18  Temp: 98.5 F (36.9 C)  SpO2: 100%    Filed Weights   05/22/17 1302  Weight: 98 lb 1.6 oz (44.5 kg)   .Body mass index is 17.94 kg/m.  GENERAL:alert, in no acute distress and comfortable SKIN: no acute rashes, no significant lesions EYES: conjunctiva are pink and non-injected, sclera anicteric OROPHARYNX: MMM, no exudates, no oropharyngeal erythema or ulceration NECK: supple, no JVD LYMPH:  no palpable lymphadenopathy in the cervical, axillary or inguinal regions LUNGS: clear to auscultation b/l with normal respiratory effort HEART: regular rate & rhythm ABDOMEN:  normoactive bowel sounds , non tender, not distended. Extremity: no pedal edema PSYCH: alert & oriented x 3 with fluent speech NEURO: no focal motor/sensory deficits  LABORATORY DATA:   I have reviewed the data as listed  . CBC Latest Ref Rng & Units 05/22/2017 05/23/2016 11/22/2015  WBC 3.9 - 10.3 10e3/uL 11.0(H) 12.3(H) -  Hemoglobin 11.6 - 15.9 g/dL 7.9(L) 7.4(L) 7.0(L)  Hematocrit 34.8 - 46.6 % 23.7(L) 21.5(L) -  Platelets 145 - 400 10e3/uL 464(H) 469(H) -    . CMP Latest Ref Rng & Units 05/22/2017 05/22/2017 11/12/2015  Glucose 70 - 140 mg/dl 107 - 139(H)  BUN 7.0 - 26.0 mg/dL 9.4 - 16  Creatinine 0.6 - 1.1 mg/dL 0.7 - 0.62  Sodium 136 - 145 mEq/L 142 - 143  Potassium 3.5 - 5.1 mEq/L 2.9 Repeated and Verified(LL) - 2.9(L)  Chloride 101 - 111 mmol/L - - 103  CO2 22 - 29 mEq/L 31(H) - 28  Calcium 8.4 - 10.4 mg/dL 9.1 - 8.9  Total Protein 6.0 - 8.5 g/dL 6.9 6.5 7.0  Total Bilirubin 0.20 - 1.20 mg/dL 2.64(H) - 3.2(H)  Alkaline Phos 40 - 150 U/L 117 - 96  AST 5 - 34 U/L 16 - 18  ALT  0 - 55 U/L 7 - 7(L)   . Lab Results  Component Value Date   LDH 254 (H) 05/22/2017        Peripheral blood smear shows RBC clumping with polychromasia. No increased blasts. Slight increase in platelets.Marland Kitchen    RADIOGRAPHIC STUDIES: I have personally reviewed the radiological images as listed and agreed with the findings in the report. No results found.  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 Hemoglobin is better today at 7.9 up from 7.4 last year. Tends to be lower during winter and has been in the 6-8 range. No overt new symptoms. Has some chronic fatigue. Overall stable hemoglobin levels with folic acid replacement and cold avoidance. IgM levels are similar to about  a year and half ago.  #2 IgM lambda MGUS - cannot rule out possibility of lymphoplasmacytic lymphoma though less likely given chronicity of her condition.  #3 history of previous B12 deficiency  Plan -Continue cold avoidance aggressively. -Continue folic acid and X73 replacement as being done currently. -Patient has no acute indication for PRBC transfusion at this time -Appropriate labs were ordered and reviewed as noted above and smear was personally evaluated to confirm the diagnosis along with the [place] test. -We discussed potential indications for treatment and treatment options. Patient wants to consider conservative approach with regards to additional workup and consideration of Rituxan. -If increasing M spike or worsening anemia wouldn't recommend getting a PET/CT scan and bone marrow examination to rule out associated lymphoplasmacytic lymphoma. If LPL present would treat accordingly. If no other associated findings with the cold agglutinin disease would consider treatment with Rituxan alone as monotherapy if needed. -Patient chooses a wait and watch approach which is quite reasonable at this time.  #4 hypokalemia -Ordered oral potassium replacement. -Recommended  follow-up with primary care physician in 10 days for repeat labs and continued potassium replacement. #5 . Patient Active Problem List   Diagnosis Date Noted  . Ureteral calculus 11/12/2015  . Breast cancer screening, high risk patient 08/29/2015  . Acute delirium 08/07/2015  . Postoperative anemia due to acute blood loss 08/06/2015  . Protein-calorie malnutrition, severe 08/04/2015  . Closed intertrochanteric fracture of left femur ([place]) 08/03/2015  . Fall 08/03/2015  . Closed right hip fracture ([place]) 08/03/2015  . Hyperbilirubinemia 08/01/2013  . Hypokalemia 07/30/2013  . Vitamin B 12 deficiency 08/07/2012  . 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 Tobacco abuse 06/10/2012   -Continue follow-up with primary care physician for management of other medical comorbidities  RTC with Dr Irene Limbo in 5 months with labs   I spent 30 minutes counseling the patient face to face. The total time spent in the appointment was 40 minutes and more than 50% was on counseling and direct patient cares.    Sullivan Lone MD [place] AAHIVMS [facility] Hematology/Oncology Physician [facility]  (Office):       [phone] (Work cell):  [phone] (Fax):           [phone]

## 2017-05-22 NOTE — Patient Instructions (Signed)
Thank you for choosing [facility] to provide your oncology and hematology care.  To afford each patient quality time with our providers, please arrive 30 minutes before your scheduled appointment time.  If you arrive late for your appointment, you may be asked to reschedule.  We strive to give you quality time with our providers, and arriving late affects you and other patients whose appointments are after yours.   If you are a no show for multiple scheduled visits, you may be dismissed from the clinic at the providers discretion.    Again, thank you for choosing [facility], our hope is that these requests will decrease the amount of time that you wait before being seen by our physicians.  ______________________________________________________________________  Should you have questions after your visit to the [facility], please contact our office at [phone] between the hours of 8:30 and 4:30 p.m.    Voicemails left after 4:30p.m will not be returned until the following business day.    For prescription refill requests, please have your pharmacy contact us directly.  Please also try to allow 48 hours for prescription requests.    Please contact the scheduling department for questions regarding scheduling.  For scheduling of procedures such as PET scans, CT scans, MRI, Ultrasound, etc please contact central scheduling at [phone].    Resources For Cancer Patients and Caregivers:   Oncolink.org:  A wonderful resource for patients and healthcare providers for information regarding your disease, ways to tract your treatment, what to expect, etc.     American Cancer Society:  800-227-2345  Can help patients locate various types of support and financial assistance  Cancer Care: 1-800-813-HOPE (4673) Provides financial assistance, online support groups, medication/co-pay assistance.    [place] DSS:  336-641-3447 Where to apply for food  stamps, Medicaid, and utility assistance  Medicare Rights Center: 800-333-4114 Helps people with Medicare understand their rights and benefits, navigate the Medicare system, and secure the quality healthcare they deserve  SCAT: 336-333-6589 [place] [employer]'s shared-ride transportation service for eligible riders who have a disability that prevents them from riding the fixed route bus.    For additional information on assistance programs please contact our social worker:   Grier Hock/Abigail Elmore:  [phone]            

## 2017-05-23 LAB — DIRECT ANTIGLOBULIN TEST (NOT AT ARMC)
Anti-IgG: NEGATIVE
COOMBS', DIRECT: POSITIVE — AB

## 2017-05-26 LAB — MULTIPLE MYELOMA PANEL, SERUM
ALPHA2 GLOB SERPL ELPH-MCNC: 0.4 g/dL (ref 0.4–1.0)
Albumin SerPl Elph-Mcnc: 4.2 g/dL (ref 2.9–4.4)
Albumin/Glob SerPl: 1.9 — ABNORMAL HIGH (ref 0.7–1.7)
Alpha 1: 0.2 g/dL (ref 0.0–0.4)
B-Globulin SerPl Elph-Mcnc: 1.4 g/dL — ABNORMAL HIGH (ref 0.7–1.3)
Gamma Glob SerPl Elph-Mcnc: 0.3 g/dL — ABNORMAL LOW (ref 0.4–1.8)
Globulin, Total: 2.3 g/dL (ref 2.2–3.9)
IGA/IMMUNOGLOBULIN A, SERUM: 126 mg/dL (ref 64–422)
IGG (IMMUNOGLOBIN G), SERUM: 456 mg/dL — AB (ref 700–1600)
M Protein SerPl Elph-Mcnc: 0.7 g/dL — ABNORMAL HIGH
TOTAL PROTEIN: 6.5 g/dL (ref 6.0–8.5)

## 2017-10-21 ENCOUNTER — Other Ambulatory Visit (HOSPITAL_COMMUNITY): Payer: Self-pay | Admitting: Internal Medicine

## 2017-10-21 DIAGNOSIS — R0989 Other specified symptoms and signs involving the circulatory and respiratory systems: Secondary | ICD-10-CM

## 2017-10-21 NOTE — Progress Notes (Signed)
Marland Kitchen  HEMATOLOGY ONCOLOGY PROGRESS NOTE  Date of service: 10/22/17   Patient Care Team: [name], MD as PCP - General (Internal Medicine) Brunetta Genera, MD as Consulting Physician (Hematology)   CC f/u for cold agglutinin disease  INTERVAL HISTORY:   Ms Rickett's is here to transfer her ongoing cares for cold agglutinin hemolytic anemia from Dr. Marko Plume. Patient was last seen by Dr. Marko Plume on 05/23/2016. Her last visit with [place] was on 05/22/17.  She has not needed any definitive pharmacologic intervention until this time. Her hemoglobin has varied in the 6-8 range. Typically tends to be in the low 6 range in winter and in the 7-8 range during summer months. She is accompanied today by her sister. The pt reports that she is doing well overall. She notes that its been several years since she received a blood transfusion and has not needed a transfusion because of her anemia. She reports that she has had some occasional lightheadedness but denies any feelings that she is close to passing out. She denies any vertigo or dizziness.   Lab results today (10/22/17) of LDH, CBC, CMP, and Reticulocytes is as follows: all values are WNL except for Retic Ct pct at 9.1, RBC at 2.02, Retic count Abs at 182.8, LDH at 322, CO2 at 31, WBC at 10.8k, RBC at 2.02, Hgb at 7.2, HCT at 22.0, MCV at 108.9, MCH at 35.6, RDW at 16.6, Platelet count at 439k and Lymph Abs at 3.7.  On review of systems, pt reports some lightheadedness, and denies vertigo, dizziness, CP, SOB, trouble with daily activities, abdominal pain and leg swelling.   REVIEW OF SYSTEMS:    10 Point review of systems of done and is negative except as noted above.  . Past Medical History:  Diagnosis Date  . B12 deficiency   . Bilateral carotid artery stenosis    right CCA and ICA 1-39%/    left ICA 40-59%  per last duplex  10-19-2015  . COPD (chronic obstructive pulmonary disease) ([place])   . Diverticulosis of colon   . History of  diverticulitis of colon    2004  . 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 Renal and ureteric calculus    left    . Past Surgical History:  Procedure Laterality Date  . ABDOMINAL HYSTERECTOMY  1986  . APPENDECTOMY  1967  . BREAST EXCISIONAL BIOPSY Right 1984  . CATARACT EXTRACTION W/ INTRAOCULAR LENS IMPLANT Right 1990's   congential cataract  . CYSTOSCOPY W/ URETERAL STENT PLACEMENT  11/12/2015   Procedure: CYSTOSCOPY WITH RETROGRADE PYELOGRAM/URETERAL STENT PLACEMENT;  Surgeon: [name], MD;  Location: [facility];  Service: Urology;;  . Consuela Mimes W/ URETERAL STENT REMOVAL Left 11/22/2015   Procedure: CYSTOSCOPY WITH STENT REMOVAL;  Surgeon: [name], MD;  [facility];  Service: Urology;  Laterality: Left;  . CYSTOSCOPY/URETEROSCOPY/HOLMIUM LASER Left 11/22/2015   Procedure: CYSTOSCOPY/URETEROSCOPY/HOLMIUM LASER;  Surgeon: [name], MD;  Location: [facility];  Service: Urology;  Laterality: Left;  FLEX URETEROSCOPY  . FEMUR IM NAIL Left 08/04/2015   Procedure: INTRAMEDULLARY (IM) NAIL FEMORAL;  Surgeon: [name], MD;  Location: [place];  Service: Orthopedics;  Laterality: Left;  . TONSILLECTOMY  age 30    . Social History   Tobacco Use  . Smoking status: Former Smoker    Packs/day: 1.00    Types: Cigarettes    Last attempt to quit: 08/03/2015  Years since quitting: 2.2  . Smokeless tobacco: Never Used  Substance Use Topics  . Alcohol use: Yes    Comment: occ  . Drug use: Not on file    ALLERGIES:  is allergic to other and shellfish allergy.  MEDICATIONS:  Current Outpatient Medications  Medication Sig Dispense Refill  . acetaminophen (TYLENOL) 325 MG tablet Take 2 tablets (650 mg total) by mouth every 6 (six) hours as needed for mild pain (or Fever >/= 101).    . Cyanocobalamin ([employer] B-12) 1000 MCG TBCR TAKE 1 TABLET DAILY 30 tablet 11  . docusate sodium (COLACE) 100 MG capsule  Take 1 capsule (100 mg total) by mouth daily.    . folic acid (FOLVITE) 1 MG tablet Take 1 tablet (1 mg total) by mouth daily. 90 tablet 11  . nicotine (NICODERM CQ - DOSED IN MG/24 HOURS) 21 mg/24hr patch Place 1 patch (21 mg total) onto the skin daily. 28 patch 0  . potassium chloride SA (K-DUR,KLOR-CON) 20 MEQ tablet Take 1 tablet (20 mEq total) by mouth 2 (two) times daily. F/u with PCP in 10-14 days to determine need for ongoing replacement 20 tablet 0   No current facility-administered medications for this visit.     PHYSICAL EXAMINATION: ECOG PERFORMANCE STATUS: 1 - Symptomatic but completely ambulatory  . There were no vitals filed for this visit.  There were no vitals filed for this visit. .There is no height or weight on file to calculate BMI.  GENERAL:alert, in no acute distress and comfortable SKIN: no acute rashes, no significant lesions EYES: conjunctiva are pink and non-injected, sclera anicteric OROPHARYNX: MMM, no exudates, no oropharyngeal erythema or ulceration NECK: supple, no JVD LYMPH:  no palpable lymphadenopathy in the cervical, axillary or inguinal regions LUNGS: clear to auscultation b/l with normal respiratory effort HEART: regular rate & rhythm ABDOMEN:  normoactive bowel sounds , non tender, not distended. Extremity: no pedal edema PSYCH: alert & oriented x 3 with fluent speech NEURO: no focal motor/sensory deficits  LABORATORY DATA:   I have reviewed the data as listed  . CBC Latest Ref Rng & Units 10/22/2017 05/22/2017 05/23/2016  WBC 3.9 - 10.3 K/uL 10.8(H) 11.0(H) 12.3(H)  Hemoglobin 11.6 - 15.9 g/dL - 7.9(L) 7.4(L)  Hematocrit 34.8 - 46.6 % 22.0(L) 23.7(L) 21.5(L)  Platelets 145 - 400 K/uL 439(H) 464(H) 469(H)  hgb 7.2  . CMP Latest Ref Rng & Units 10/22/2017 05/22/2017 05/22/2017  Glucose 70 - 140 mg/dL 87 107 -  BUN 7 - 26 mg/dL 13 9.4 -  Creatinine 0.60 - 1.10 mg/dL 0.72 0.7 -  Sodium 136 - 145 mmol/L 141 142 -  Potassium 3.5 - 5.1 mmol/L  3.5 2.9 Repeated and Verified(LL) -  Chloride 98 - 109 mmol/L 102 - -  CO2 22 - 29 mmol/L 31(H) 31(H) -  Calcium 8.4 - 10.4 mg/dL 8.9 9.1 -  Total Protein 6.4 - 8.3 g/dL 6.8 6.9 6.5  Total Bilirubin 0.2 - 1.2 mg/dL 2.7(H) 2.64(H) -  Alkaline Phos 40 - 150 U/L 113 117 -  AST 5 - 34 U/L 22 16 -  ALT 0 - 55 U/L 9 7 -   . Lab Results  Component Value Date   LDH 322 (H) 10/22/2017        Peripheral blood smear shows RBC clumping with polychromasia. No increased blasts. Slight increase in platelets.Marland Kitchen    RADIOGRAPHIC STUDIES: I have personally reviewed the radiological images as listed and agreed with the findings in the  report. No results found.  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 Her hemoglobin tends to be lower during winter and has been in the 6-8 range. Overall stable hemoglobin levels with folic acid replacement and cold avoidance. IgM levels are similar to about a year and half ago. -Discussd labwork today with pt and her sister -Because the pts Hgb is just above 7, she cites lightheadedness, and there are several more months of cold in front of [place], we gave her the option of receiving a transfusion. The pt elected to receive a blood transfusion.  -Discussed with pt and her sister different ways that cold-agglutination can develop -The pt reports having knowledge of her condition for about 10 years now. Since the pts blood work has remained consistent for so long, more rigorous testing is not advised.   #2 IgM lambda MGUS - cannot rule out possibility of lymphoplasmacytic lymphoma though less likely given chronicity of her condition.  #3 history of previous B12 deficiency  Plan -Continue cold avoidance aggressively. -Continue folic acid and Q65 replacement as being done currently. -We discussed potential indications for treatment and treatment options. Patient wants to consider conservative approach with regards to additional workup  and consideration of Rituxan. -If increasing M spike or worsening anemia wouldn't recommend getting a PET/CT scan and bone marrow examination to rule out associated lymphoplasmacytic lymphoma. If LPL present would treat accordingly. If no other associated findings with the cold agglutinin disease would consider treatment with Rituxan alone as monotherapy if needed. -Patient chooses a wait and watch approach which is quite reasonable at this time. -given symptomatic anemia and after discussion with patient she would like to proceed with PRBC transfusion (will need to use blood warmer).  #4 hypokalemia -Ordered oral potassium replacement. -K today improved at 3.5 #5 . Patient Active Problem List   Diagnosis Date Noted  . Ureteral calculus 11/12/2015  . Breast cancer screening, high risk patient 08/29/2015  . Acute delirium 08/07/2015  . Postoperative anemia due to acute blood loss 08/06/2015  . Protein-calorie malnutrition, severe 08/04/2015  . Closed intertrochanteric fracture of left femur ([place]) 08/03/2015  . Fall 08/03/2015  . Closed right hip fracture ([place]) 08/03/2015  . Hyperbilirubinemia 08/01/2013  . Hypokalemia 07/30/2013  . Vitamin B 12 deficiency 08/07/2012  . 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 Tobacco abuse 06/10/2012   -Continue follow-up with primary care physician for management of other medical comorbidities  Blood labs today Order blood transfusion RTC in 3-4 weeks with labs   I spent 20 minutes counseling the patient face to face. The total time spent in the appointment was 25 minutes and more than 50% was on counseling and direct patient cares.    Sullivan Lone MD [place] AAHIVMS [facility] Hematology/Oncology Physician [facility]  (Office):       [phone] (Work cell):  [phone] (Fax):           [phone]  This document serves as a record of services personally performed by Sullivan Lone, MD. It was created on his  behalf by Baldwin Jamaica, a trained medical scribe. The creation of this record is based on the scribe's personal observations and the provider's statements to them.   .I have reviewed the above documentation for accuracy and completeness, and I agree with the above. Brunetta Genera MD MS

## 2017-10-22 ENCOUNTER — Inpatient Hospital Stay: Payer: Medicare Other | Attending: Hematology | Admitting: Hematology

## 2017-10-22 ENCOUNTER — Other Ambulatory Visit: Payer: Self-pay

## 2017-10-22 ENCOUNTER — Inpatient Hospital Stay: Payer: Medicare Other

## 2017-10-22 ENCOUNTER — Encounter: Payer: Self-pay | Admitting: Hematology

## 2017-10-22 VITALS — BP 127/47 | HR 68 | Temp 98.5°F | Resp 18 | Ht 62.0 in | Wt 101.0 lb

## 2017-10-22 DIAGNOSIS — E538 Deficiency of other specified B group vitamins: Secondary | ICD-10-CM

## 2017-10-22 DIAGNOSIS — D5912 Cold autoimmune hemolytic anemia: Secondary | ICD-10-CM

## 2017-10-22 DIAGNOSIS — I6523 Occlusion and stenosis of bilateral carotid arteries: Secondary | ICD-10-CM | POA: Insufficient documentation

## 2017-10-22 DIAGNOSIS — Z8619 Personal history of other infectious and parasitic diseases: Secondary | ICD-10-CM | POA: Diagnosis not present

## 2017-10-22 DIAGNOSIS — R42 Dizziness and giddiness: Secondary | ICD-10-CM

## 2017-10-22 DIAGNOSIS — Z90710 Acquired absence of both cervix and uterus: Secondary | ICD-10-CM | POA: Diagnosis not present

## 2017-10-22 DIAGNOSIS — D591 Autoimmune hemolytic anemia, unspecified: Secondary | ICD-10-CM

## 2017-10-22 DIAGNOSIS — E876 Hypokalemia: Secondary | ICD-10-CM | POA: Insufficient documentation

## 2017-10-22 DIAGNOSIS — Z87891 Personal history of nicotine dependence: Secondary | ICD-10-CM | POA: Diagnosis not present

## 2017-10-22 DIAGNOSIS — Z87442 Personal history of urinary calculi: Secondary | ICD-10-CM | POA: Diagnosis not present

## 2017-10-22 DIAGNOSIS — Z9049 Acquired absence of other specified parts of digestive tract: Secondary | ICD-10-CM | POA: Diagnosis not present

## 2017-10-22 DIAGNOSIS — D472 Monoclonal gammopathy: Secondary | ICD-10-CM | POA: Insufficient documentation

## 2017-10-22 LAB — CBC WITH DIFFERENTIAL (CANCER CENTER ONLY)
Basophils Absolute: 0 10*3/uL (ref 0.0–0.1)
Basophils Relative: 0 %
EOS ABS: 0 10*3/uL (ref 0.0–0.5)
EOS PCT: 0 %
HCT: 22 % — ABNORMAL LOW (ref 34.8–46.6)
Hemoglobin: 7.2 g/dL — ABNORMAL LOW (ref 11.6–15.9)
LYMPHS ABS: 3.7 10*3/uL — AB (ref 0.9–3.3)
Lymphocytes Relative: 34 %
MCH: 35.6 pg — AB (ref 25.1–34.0)
MCHC: 32.7 g/dL (ref 31.5–36.0)
MCV: 108.9 fL — ABNORMAL HIGH (ref 79.5–101.0)
Monocytes Absolute: 0.7 10*3/uL (ref 0.1–0.9)
Monocytes Relative: 7 %
Neutro Abs: 6.3 10*3/uL (ref 1.5–6.5)
Neutrophils Relative %: 59 %
PLATELETS: 439 10*3/uL — AB (ref 145–400)
RBC: 2.02 MIL/uL — AB (ref 3.70–5.45)
RDW: 16.6 % — ABNORMAL HIGH (ref 11.2–14.5)
WBC: 10.8 10*3/uL — AB (ref 3.9–10.3)

## 2017-10-22 LAB — SAMPLE TO BLOOD BANK

## 2017-10-22 LAB — COMPREHENSIVE METABOLIC PANEL
ALK PHOS: 113 U/L (ref 40–150)
ALT: 9 U/L (ref 0–55)
ANION GAP: 8 (ref 3–11)
AST: 22 U/L (ref 5–34)
Albumin: 4.2 g/dL (ref 3.5–5.0)
BUN: 13 mg/dL (ref 7–26)
CALCIUM: 8.9 mg/dL (ref 8.4–10.4)
CO2: 31 mmol/L — AB (ref 22–29)
Chloride: 102 mmol/L (ref 98–109)
Creatinine, Ser: 0.72 mg/dL (ref 0.60–1.10)
GFR calc non Af Amer: >60 mL/min (ref >60)
Glucose, Bld: 87 mg/dL (ref 70–140)
Potassium: 3.5 mmol/L (ref 3.5–5.1)
SODIUM: 141 mmol/L (ref 136–145)
Total Bilirubin: 2.7 mg/dL — ABNORMAL HIGH (ref 0.2–1.2)
Total Protein: 6.8 g/dL (ref 6.4–8.3)

## 2017-10-22 LAB — RETICULOCYTES
RBC.: 2.02 MIL/uL — ABNORMAL LOW (ref 3.70–5.45)
RETIC CT PCT: 9.1 % — AB (ref 0.7–2.1)
Retic Count, Absolute: 183.8 10*3/uL — ABNORMAL HIGH (ref 33.7–90.7)

## 2017-10-22 LAB — LACTATE DEHYDROGENASE: LDH: 322 U/L — ABNORMAL HIGH (ref 125–245)

## 2017-10-23 LAB — PREPARE RBC (CROSSMATCH)

## 2017-10-24 ENCOUNTER — Ambulatory Visit (HOSPITAL_COMMUNITY)
Admission: RE | Admit: 2017-10-24 | Discharge: 2017-10-24 | Disposition: A | Discharge status: Home / Self Care | Payer: Medicare Other | Source: Ambulatory Visit | Attending: Internal Medicine | Admitting: Internal Medicine

## 2017-10-24 ENCOUNTER — Inpatient Hospital Stay: Payer: Medicare Other | Attending: Hematology

## 2017-10-24 ENCOUNTER — Ambulatory Visit (HOSPITAL_COMMUNITY)
Admission: RE | Admit: 2017-10-24 | Discharge: 2017-10-24 | Disposition: A | Discharge status: Home / Self Care | Payer: Medicare Other | Source: Ambulatory Visit | Attending: Hematology | Admitting: Hematology

## 2017-10-24 ENCOUNTER — Encounter (HOSPITAL_COMMUNITY): Payer: Medicare Other

## 2017-10-24 DIAGNOSIS — E538 Deficiency of other specified B group vitamins: Secondary | ICD-10-CM | POA: Insufficient documentation

## 2017-10-24 DIAGNOSIS — D5912 Cold autoimmune hemolytic anemia: Secondary | ICD-10-CM

## 2017-10-24 DIAGNOSIS — R0989 Other specified symptoms and signs involving the circulatory and respiratory systems: Secondary | ICD-10-CM | POA: Diagnosis not present

## 2017-10-24 DIAGNOSIS — Z87891 Personal history of nicotine dependence: Secondary | ICD-10-CM | POA: Insufficient documentation

## 2017-10-24 DIAGNOSIS — R42 Dizziness and giddiness: Secondary | ICD-10-CM | POA: Insufficient documentation

## 2017-10-24 DIAGNOSIS — D591 Other autoimmune hemolytic anemias: Secondary | ICD-10-CM | POA: Diagnosis not present

## 2017-10-24 DIAGNOSIS — I6523 Occlusion and stenosis of bilateral carotid arteries: Secondary | ICD-10-CM | POA: Insufficient documentation

## 2017-10-24 DIAGNOSIS — J449 Chronic obstructive pulmonary disease, unspecified: Secondary | ICD-10-CM | POA: Diagnosis not present

## 2017-10-24 DIAGNOSIS — Z87442 Personal history of urinary calculi: Secondary | ICD-10-CM | POA: Insufficient documentation

## 2017-10-24 DIAGNOSIS — D472 Monoclonal gammopathy: Secondary | ICD-10-CM | POA: Insufficient documentation

## 2017-10-24 DIAGNOSIS — K5790 Diverticulosis of intestine, part unspecified, without perforation or abscess without bleeding: Secondary | ICD-10-CM | POA: Diagnosis not present

## 2017-10-24 MED ORDER — DIPHENHYDRAMINE HCL 25 MG PO CAPS
25.0000 mg | ORAL_CAPSULE | Freq: Once | ORAL | Status: AC
  Administered 2017-10-24: 25 mg via ORAL

## 2017-10-24 MED ORDER — ACETAMINOPHEN 325 MG PO TABS
650.0000 mg | ORAL_TABLET | Freq: Once | ORAL | Status: AC
  Administered 2017-10-24: 650 mg via ORAL

## 2017-10-24 MED ORDER — SODIUM CHLORIDE 0.9 % IV SOLN
250.0000 mL | Freq: Once | INTRAVENOUS | Status: AC
  Administered 2017-10-24: 250 mL via INTRAVENOUS

## 2017-10-24 NOTE — Progress Notes (Signed)
*  PRELIMINARY RESULTS* Vascular Ultrasound Carotid Duplex (Doppler) has been completed.  Preliminary findings:   Right Carotid: Velocities in the right ICA are consistent with a 40-59% stenosis. The ECA appears >50% stenosed.    Left Carotid: Velocities in the left ICA are consistent with a 40-59% stenosis. The ECA appears >50% stenosed.    Vertebrals:Both vertebral arteries were patent with antegrade flow.    [name] 10/24/2017, 10:59 AM

## 2017-10-24 NOTE — Patient Instructions (Signed)

## 2017-10-25 LAB — BPAM RBC
Blood Product Expiration Date: 201902152359
ISSUE DATE / TIME: 201902011112
Unit Type and Rh: 9500

## 2017-10-25 LAB — TYPE AND SCREEN
ABO/RH(D): B POS
Antibody Screen: POSITIVE
DAT, IgG: POSITIVE
Unit division: 0

## 2017-11-19 NOTE — Progress Notes (Signed)
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 The patient's last visit with [place] was on 10/22/17. She is accompanied today by her daughter. The pt reports that she is very well and notes no changes in how she felt since her last visit. Of note since the patient last visit, pt has had a blood transfusion on 10/24/17. She notes seeing her PCP once each year.   She notes occasional feelings of lightheadedness in the mornings, but notes no problems walking or feeling unbalanced. She notes that she has been taking her folic acid, vitamins, and potassium 20mg .   Lab results today (11/20/17) of CBC, CMP, and Reticulocytes is as follows: all values are WNL except for RBC at 1.88, Hgb at 7.1, HCT at 20.6, MCV at 109.6, MCH at 37.8, RDW at 18.8, Platelets at 401k, Lymphs Abs at 3.5k, Total Bilirubin at 3.4, Retic Ct Pct at 7.7%, and Retic Ct Abs at 144.8. LDH 11/20/17 at 312.  On review of systems, pt reports occasional mild lightheadedness, eating well, and denies dizziness, mouth sores, leg swelling, and any other symptoms.  REVIEW OF SYSTEMS:   .10 Point review of Systems was done is negative except as noted above.   Past Medical History:  Diagnosis Date  . B12 deficiency   . Bilateral carotid artery stenosis    right CCA and ICA 1-39%/    left ICA 40-59%  per last duplex  10-19-2015  . COPD (chronic obstructive pulmonary disease) ([place])   . Diverticulosis of colon   . History of diverticulitis of colon    2004  . 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 Renal and ureteric calculus    left    . Past  Surgical History:  Procedure Laterality Date  . ABDOMINAL HYSTERECTOMY  1986  . APPENDECTOMY  1967  . BREAST EXCISIONAL BIOPSY Right 1984  . CATARACT EXTRACTION W/ INTRAOCULAR LENS IMPLANT Right 1990's   congential cataract  . CYSTOSCOPY W/ URETERAL STENT PLACEMENT  11/12/2015   Procedure: CYSTOSCOPY WITH RETROGRADE PYELOGRAM/URETERAL STENT PLACEMENT;  Surgeon: [name], MD;  Location: [facility];  Service: Urology;;  . Consuela Mimes W/ URETERAL STENT REMOVAL Left 11/22/2015   Procedure: CYSTOSCOPY WITH STENT REMOVAL;  Surgeon: [name], MD;  Location: [facility];  Service: Urology;  Laterality: Left;  . CYSTOSCOPY/URETEROSCOPY/HOLMIUM LASER Left 11/22/2015   Procedure: CYSTOSCOPY/URETEROSCOPY/HOLMIUM LASER;  Surgeon: [name], MD;  Location[facility];  Service: Urology;  Laterality: Left;  FLEX URETEROSCOPY  . FEMUR IM NAIL Left 08/04/2015   Procedure: INTRAMEDULLARY (IM) NAIL FEMORAL;  Surgeon: [name], MD;  Location: [place];  Service: Orthopedics;  Laterality: Left;  . TONSILLECTOMY  age 40    . Social History   Tobacco Use  . Smoking status: Former Smoker    Packs/day: 1.00    Types: Cigarettes    Last attempt to quit: 08/03/2015    Years since quitting: 2.3  . Smokeless tobacco: Never Used  Substance Use Topics  . Alcohol use: Yes    Comment: occ  . Drug use: Not on file    ALLERGIES:  is allergic to other and shellfish allergy.  MEDICATIONS:  Current Outpatient Medications  Medication Sig Dispense Refill  . acetaminophen (TYLENOL) 325 MG tablet Take 2 tablets (650 mg total) by mouth every 6 (six) hours as needed for mild pain (or Fever >/= 101).    . Cyanocobalamin ([employer] B-12) 1000 MCG TBCR TAKE 1 TABLET DAILY 30 tablet 11  . docusate sodium (COLACE) 100 MG capsule Take 1 capsule (100 mg total) by mouth daily.    . folic acid (FOLVITE) 1 MG tablet Take 1 tablet (1 mg total) by mouth daily. 90 tablet 11  . nicotine (NICODERM CQ -  DOSED IN MG/24 HOURS) 21 mg/24hr patch Place 1 patch (21 mg total) onto the skin daily. 28 patch 0  . potassium chloride SA (K-DUR,KLOR-CON) 20 MEQ tablet Take 1 tablet (20 mEq total) by mouth 2 (two) times daily. F/u with PCP in 10-14 days to determine need for ongoing replacement 20 tablet 0   No current facility-administered medications for this visit.     PHYSICAL EXAMINATION: ECOG PERFORMANCE STATUS: 1 - Symptomatic but completely ambulatory  . Vitals:   11/20/17 0928  BP: (!) 135/50  Pulse: (!) 57  Resp: 16  Temp: 98.5 F (36.9 C)  SpO2: 99%    Filed Weights   11/20/17 0928  Weight: 99 lb 11.2 oz (45.2 kg)   .Body mass index is 18.24 kg/m.  [facility] Kitchen GENERAL:alert, in no acute distress and comfortable SKIN: no acute rashes, no significant lesions EYES: conjunctival pallor noted, mildly icteric sclera. OROPHARYNX: MMM, no exudates, no oropharyngeal erythema or ulceration NECK: supple, no JVD LYMPH:  no palpable lymphadenopathy in the cervical, axillary or inguinal regions LUNGS: clear to auscultation b/l with normal respiratory effort HEART: regular rate & rhythm ABDOMEN:  normoactive bowel sounds , non tender, not distended. Extremity: no pedal edema PSYCH: alert & oriented x 3 with fluent speech NEURO: no focal motor/sensory deficits    LABORATORY DATA:   I have reviewed the data as listed  . CBC Latest Ref Rng & Units 11/20/2017 10/22/2017 05/22/2017  WBC 3.9 - 10.3 K/uL 9.0 10.8(H) 11.0(H)  Hemoglobin 11.6 - 15.9 g/dL 7.1(L) - 7.9(L)  Hematocrit 34.8 - 46.6 % 20.6(L) 22.0(L) 23.7(L)  Platelets 145 - 400 K/uL 401(H) 439(H) 464(H)    CMP Latest Ref Rng & Units 11/20/2017 10/22/2017 05/22/2017  Glucose 70 - 140 mg/dL 79 87 107  BUN 7 - 26 mg/dL 11 13 9.4  Creatinine 0.60 - 1.10 mg/dL 0.66 0.72 0.7  Sodium 136 - 145 mmol/L 144 141 142  Potassium 3.5 - 5.1 mmol/L 3.5 3.5 2.9 Repeated and Verified(LL)  Chloride 98 - 109 mmol/L 106 102 -  CO2 22 - 29 mmol/L 29 31(H)  31(H)  Calcium 8.4 - 10.4 mg/dL 8.9 8.9 9.1  Total Protein 6.4 - 8.3 g/dL 6.4 6.8 6.9  Total Bilirubin 0.2 - 1.2 mg/dL 3.4(H) 2.7(H) 2.64(H)  Alkaline Phos 40 - 150 U/L 107 113 117  AST 5 - 34 U/L 20 22 16   ALT 0 - 55 U/L 11 9 7    . Lab Results  Component Value Date   LDH 312 (H) 11/20/2017         RADIOGRAPHIC STUDIES: I have personally reviewed the radiological images as listed and agreed with the findings in the report. No results found.  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 Her hemoglobin tends to be lower during winter and has been in the 6-8  range. Overall stable hemoglobin levels in the low 7 range with folic acid replacement and cold avoidance. Plan -Discussd labwork today with pt and her family member -Because the pts Hgb is just above 7, she cites minimal lightheadedness only when she gets up quickly but not otherwise. -patient wants to hold off on additional PRBC transfusion at this time. -she will let us know if she is having more symptoms of fatigue or dizziness and we will set her up for a PRBC transfusion. -stay warm and absolute cold avoidance  #2 IgM lambda MGUS - cannot rule out possibility of lymphoplasmacytic lymphoma though less likely given chronicity of her condition.  #3 history of previous B12 deficiency  Plan -Continue cold avoidance aggressively. -Continue folic acid and O97 replacement as being done currently. -patient continues to prefer conservative approach with regards to additional workup and consideration of Rituxan. -If increasing M spike or worsening anemia wouldn't recommend getting a PET/CT scan and bone marrow examination to rule out associated lymphoplasmacytic lymphoma. If LPL present would treat accordingly. If no other associated findings with the cold agglutinin disease would consider treatment with Rituxan alone as monotherapy if needed. -Patient chooses a wait and watch approach which is  quite reasonable at this time. -Discussed pt labwork today 11/20/17; labs are stable.  -Advised seeing pt back in 6-8 weeks -Advised pt to let us know if she has worsening symptoms of weakness, tiredness, dizziness and lightheadedness and that we would likely perform a transfusion then. Discussed pros vs cons of PRBC transfusion at this time.  #4 hypokalemia -Advised continuing her 20mg  potassium pill; potassium labs today low normal at 3.5.   #5 . Patient Active Problem List   Diagnosis Date Noted  . Ureteral calculus 11/12/2015  . Breast cancer screening, high risk patient 08/29/2015  . Acute delirium 08/07/2015  . Postoperative anemia due to acute blood loss 08/06/2015  . Protein-calorie malnutrition, severe 08/04/2015  . Closed intertrochanteric fracture of left femur ([place]) 08/03/2015  . Fall 08/03/2015  . Closed right hip fracture ([place]) 08/03/2015  . Hyperbilirubinemia 08/01/2013  . Hypokalemia 07/30/2013  . Vitamin B 12 deficiency 08/07/2012  . 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 Tobacco abuse 06/10/2012   -Continue follow-up with primary care physician for management of other medical comorbidities  RTC in 2 months with labs with Dr Irene Limbo   . The total time spent in the appointment was 25 minutes and more than 50% was on counseling and direct patient cares.  Sullivan Lone MD [place] AAHIVMS [facility] Hematology/Oncology Physician [facility]  (Office):       [phone] (Work cell):  [phone] (Fax):           [phone]  This document serves as a record of services personally performed by Sullivan Lone, MD. It was created on his behalf by Baldwin Jamaica, a trained medical scribe. The creation of this record is based on the scribe's personal observations and the provider's statements to them.   .I have reviewed the above documentation for accuracy and completeness, and I agree with the above. Brunetta Genera MD MS

## 2017-11-20 ENCOUNTER — Inpatient Hospital Stay (HOSPITAL_BASED_OUTPATIENT_CLINIC_OR_DEPARTMENT_OTHER): Payer: Medicare Other | Admitting: Hematology

## 2017-11-20 ENCOUNTER — Encounter: Payer: Self-pay | Admitting: Hematology

## 2017-11-20 ENCOUNTER — Inpatient Hospital Stay: Payer: Medicare Other

## 2017-11-20 VITALS — BP 135/50 | HR 57 | Temp 98.5°F | Resp 16 | Ht 62.0 in | Wt 99.7 lb

## 2017-11-20 DIAGNOSIS — K5790 Diverticulosis of intestine, part unspecified, without perforation or abscess without bleeding: Secondary | ICD-10-CM | POA: Diagnosis not present

## 2017-11-20 DIAGNOSIS — D591 Autoimmune hemolytic anemia, unspecified: Secondary | ICD-10-CM

## 2017-11-20 DIAGNOSIS — J449 Chronic obstructive pulmonary disease, unspecified: Secondary | ICD-10-CM

## 2017-11-20 DIAGNOSIS — I6523 Occlusion and stenosis of bilateral carotid arteries: Secondary | ICD-10-CM

## 2017-11-20 DIAGNOSIS — Z87891 Personal history of nicotine dependence: Secondary | ICD-10-CM | POA: Diagnosis not present

## 2017-11-20 DIAGNOSIS — R42 Dizziness and giddiness: Secondary | ICD-10-CM

## 2017-11-20 DIAGNOSIS — D472 Monoclonal gammopathy: Secondary | ICD-10-CM

## 2017-11-20 DIAGNOSIS — E538 Deficiency of other specified B group vitamins: Secondary | ICD-10-CM

## 2017-11-20 DIAGNOSIS — D5912 Cold autoimmune hemolytic anemia: Secondary | ICD-10-CM

## 2017-11-20 DIAGNOSIS — Z87442 Personal history of urinary calculi: Secondary | ICD-10-CM

## 2017-11-20 LAB — CBC WITH DIFFERENTIAL/PLATELET
BASOS ABS: 0.1 10*3/uL (ref 0.0–0.1)
BASOS PCT: 1 %
Eosinophils Absolute: 0 10*3/uL (ref 0.0–0.5)
Eosinophils Relative: 0 %
HEMATOCRIT: 20.6 % — AB (ref 34.8–46.6)
HEMOGLOBIN: 7.1 g/dL — AB (ref 11.6–15.9)
Lymphocytes Relative: 38 %
Lymphs Abs: 3.5 10*3/uL — ABNORMAL HIGH (ref 0.9–3.3)
MCH: 37.8 pg — ABNORMAL HIGH (ref 25.1–34.0)
MCHC: 34.5 g/dL (ref 31.5–36.0)
MCV: 109.6 fL — ABNORMAL HIGH (ref 79.5–101.0)
Monocytes Absolute: 0.5 10*3/uL (ref 0.1–0.9)
Monocytes Relative: 5 %
NEUTROS PCT: 56 %
Neutro Abs: 5 10*3/uL (ref 1.5–6.5)
Platelets: 401 10*3/uL — ABNORMAL HIGH (ref 145–400)
RBC: 1.88 MIL/uL — AB (ref 3.70–5.45)
RDW: 18.8 % — ABNORMAL HIGH (ref 11.2–14.5)
WBC: 9 10*3/uL (ref 3.9–10.3)

## 2017-11-20 LAB — COMPREHENSIVE METABOLIC PANEL
ALT: 11 U/L (ref 0–55)
ANION GAP: 9 (ref 3–11)
AST: 20 U/L (ref 5–34)
Albumin: 4 g/dL (ref 3.5–5.0)
Alkaline Phosphatase: 107 U/L (ref 40–150)
BUN: 11 mg/dL (ref 7–26)
CALCIUM: 8.9 mg/dL (ref 8.4–10.4)
CO2: 29 mmol/L (ref 22–29)
Chloride: 106 mmol/L (ref 98–109)
Creatinine, Ser: 0.66 mg/dL (ref 0.60–1.10)
GFR calc non Af Amer: >60 mL/min (ref >60)
Glucose, Bld: 79 mg/dL (ref 70–140)
Potassium: 3.5 mmol/L (ref 3.5–5.1)
Sodium: 144 mmol/L (ref 136–145)
TOTAL PROTEIN: 6.4 g/dL (ref 6.4–8.3)
Total Bilirubin: 3.4 mg/dL — ABNORMAL HIGH (ref 0.2–1.2)

## 2017-11-20 LAB — RETICULOCYTES
RBC.: 1.88 MIL/uL — AB (ref 3.70–5.45)
RETIC COUNT ABSOLUTE: 144.8 10*3/uL — AB (ref 33.7–90.7)
Retic Ct Pct: 7.7 % — ABNORMAL HIGH (ref 0.7–2.1)

## 2017-11-20 LAB — LACTATE DEHYDROGENASE: LDH: 312 U/L — ABNORMAL HIGH (ref 125–245)

## 2017-11-24 LAB — MULTIPLE MYELOMA PANEL, SERUM
ALBUMIN/GLOB SERPL: 1.8 — AB (ref 0.7–1.7)
ALPHA2 GLOB SERPL ELPH-MCNC: 0.5 g/dL (ref 0.4–1.0)
Albumin SerPl Elph-Mcnc: 4 g/dL (ref 2.9–4.4)
Alpha 1: 0.2 g/dL (ref 0.0–0.4)
B-Globulin SerPl Elph-Mcnc: 1.3 g/dL (ref 0.7–1.3)
GAMMA GLOB SERPL ELPH-MCNC: 0.4 g/dL (ref 0.4–1.8)
GLOBULIN, TOTAL: 2.3 g/dL (ref 2.2–3.9)
IGG (IMMUNOGLOBIN G), SERUM: 419 mg/dL — AB (ref 700–1600)
IgA: 95 mg/dL (ref 64–422)
IgM (Immunoglobulin M), Srm: 1014 mg/dL — ABNORMAL HIGH (ref 26–217)
M PROTEIN SERPL ELPH-MCNC: 0.8 g/dL — AB
Total Protein ELP: 6.3 g/dL (ref 6.0–8.5)

## 2017-11-28 ENCOUNTER — Other Ambulatory Visit: Payer: Self-pay

## 2017-11-28 ENCOUNTER — Encounter: Payer: Self-pay | Admitting: Vascular Surgery

## 2017-11-28 ENCOUNTER — Ambulatory Visit: Payer: Medicare Other | Admitting: Vascular Surgery

## 2017-11-28 VITALS — BP 117/60 | HR 71 | Temp 98.8°F | Resp 16 | Ht 62.0 in | Wt 102.4 lb

## 2017-11-28 DIAGNOSIS — I6523 Occlusion and stenosis of bilateral carotid arteries: Secondary | ICD-10-CM

## 2017-11-28 NOTE — Progress Notes (Signed)
Patient ID: [name], female   DOB: 04/23/1941, 77 y.o.   MRN: [id number]  Reason for Consult: New Patient (Initial Visit) (Carotid Stenosis.  Dr. [name] [phone].)   Referred by [name], MD  Subjective:     HPI:  [name] is a 77 y.o. female who is a long-term smoker denies any hypertension hyperlipidemia.  She has known carotid artery stenosis from duplex 2 years ago.  She has never had stroke TIA or amaurosis.  She denies any coronary disease.  She is active and walks with the help of a cane.  She does have COPD.  She is here today with ultrasound to evaluate her carotid artery disease.  Past Medical History:  Diagnosis Date  . B12 deficiency   . Bilateral carotid artery stenosis    right CCA and ICA 1-39%/    left ICA 40-59%  per last duplex  10-19-2015  . COPD (chronic obstructive pulmonary disease) ([place])   . Diverticulosis of colon   . History of diverticulitis of colon    2004  . 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 Renal and ureteric calculus    left   Family History  Problem Relation Age of Onset  . Breast cancer Cousin    Past Surgical History:  Procedure Laterality Date  . ABDOMINAL HYSTERECTOMY  1986  . APPENDECTOMY  1967  . BREAST EXCISIONAL BIOPSY Right 1984  . CATARACT EXTRACTION W/ INTRAOCULAR LENS IMPLANT Right 1990's   congential cataract  . CYSTOSCOPY W/ URETERAL STENT PLACEMENT  11/12/2015   Procedure: CYSTOSCOPY WITH RETROGRADE PYELOGRAM/URETERAL STENT PLACEMENT;  Surgeon: [name], MD;  Location: [facility];  Service: Urology;;  . Consuela Mimes W/ URETERAL STENT REMOVAL Left 11/22/2015   Procedure: CYSTOSCOPY WITH STENT REMOVAL;  Surgeon: [name], MD;  Location[facility];  Service: Urology;  Laterality: Left;  . CYSTOSCOPY/URETEROSCOPY/HOLMIUM LASER Left 11/22/2015   Procedure: CYSTOSCOPY/URETEROSCOPY/HOLMIUM LASER;  Surgeon: [name], MD;  Location:  [facility];  Service: Urology;  Laterality: Left;  FLEX URETEROSCOPY  . FEMUR IM NAIL Left 08/04/2015   Procedure: INTRAMEDULLARY (IM) NAIL FEMORAL;  Surgeon: [name], MD;  Location: [place];  Service: Orthopedics;  Laterality: Left;  . TONSILLECTOMY  age 74    Short Social History:  Social History   Tobacco Use  . Smoking status: Former Smoker    Packs/day: 1.00    Types: Cigarettes    Last attempt to quit: 08/03/2015    Years since quitting: 2.3  . Smokeless tobacco: Never Used  Substance Use Topics  . Alcohol use: Yes    Comment: occ    Allergies  Allergen Reactions  . Other Other (See Comments)    "Most antibiotics give yeast infection"  . Shellfish Allergy Hives    Current Outpatient Medications  Medication Sig Dispense Refill  . acetaminophen (TYLENOL) 325 MG tablet Take 2 tablets (650 mg total) by mouth every 6 (six) hours as needed for mild pain (or Fever >/= 101).    . Cyanocobalamin ([employer] B-12) 1000 MCG TBCR TAKE 1 TABLET DAILY 30 tablet 11  . docusate sodium (COLACE) 100 MG capsule Take 1 capsule (100 mg total) by mouth daily.    . folic acid (FOLVITE) 1 MG tablet Take 1 tablet (1 mg total) by mouth daily. 90 tablet 11  . nicotine (NICODERM CQ - DOSED IN MG/24 HOURS) 21 mg/24hr patch Place 1 patch (21  mg total) onto the skin daily. 28 patch 0  . potassium chloride SA (K-DUR,KLOR-CON) 20 MEQ tablet Take 1 tablet (20 mEq total) by mouth 2 (two) times daily. F/u with PCP in 10-14 days to determine need for ongoing replacement 20 tablet 0   No current facility-administered medications for this visit.     Review of Systems  Constitutional:  Constitutional negative. HENT: HENT negative.  Eyes: Eyes negative.  Respiratory: Respiratory negative.  Cardiovascular: Cardiovascular negative.  Musculoskeletal: Musculoskeletal negative.  Skin: Skin negative.  Neurological: Neurological negative. Hematologic: Hematologic/lymphatic negative.    Psychiatric: Psychiatric negative.        Objective:  Objective   Vitals:   11/28/17 1253 11/28/17 1257  BP: (!) 141/58 117/60  Pulse: 71   Resp: 16   Temp: 98.8 F (37.1 C)   TempSrc: Oral   SpO2: 98%   Weight: 102 lb 6.4 oz (46.4 kg)   Height: 5\' 2"  (1.575 m)    Body mass index is 18.73 kg/m.  Physical Exam  Constitutional: She is oriented to person, place, and time. She appears well-developed.  HENT:  Head: Normocephalic.  Eyes: Pupils are equal, round, and reactive to light.  Neck: Normal range of motion.  Cardiovascular: Normal rate.  Pulses:      Femoral pulses are 2+ on the right side, and 2+ on the left side. Pulmonary/Chest: Effort normal.  Abdominal: Soft.  Musculoskeletal: Normal range of motion. She exhibits no edema.  Neurological: She is alert and oriented to person, place, and time.  Skin: Skin is warm and dry.  Psychiatric: She has a normal mood and affect. Her behavior is normal. Judgment and thought content normal.    Data:      Assessment/Plan:     77 year old female presents for evaluation bilateral carotid artery stenosis.  This time she is 40-59% and is asymptomatic.  I have asked her to start taking aspirin and she should have consideration of a statin drug for plaque stabilization.  She will follow-up in 1 year with repeat carotid duplex.  We have discussed the signs and symptoms of stroke or TIA for which she would need to seek emergent medical care and she demonstrates good understanding in the presence of her sister.     Elaine Sandy MD [facility]

## 2018-01-14 NOTE — Progress Notes (Signed)
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 Today she presents to the clinic accompanied by her family member. She notes she was feeling well every before a few weeks ago.   On review of symptoms, pt notes having new pain in her lower back since 3-4 days ago and is not sure where this comes from. The pain reaches across her lower back. This has gotten worse and has been trying to be careful. She has lidocaine pain patches and may try to use them. She has not had pain like this before. She has tenderness over the spine. She denies trouble passing urine or moving her bowels. She denies fever.     REVIEW OF SYSTEMS:   .10 Point review of Systems was done is negative except as noted above.  Past Medical History:  Diagnosis Date  . B12 deficiency   . Bilateral carotid artery stenosis    right CCA and ICA 1-39%/    left ICA 40-59%  per last duplex  10-19-2015  . COPD (chronic obstructive pulmonary disease) ([place])   . Diverticulosis of colon   . History of diverticulitis of colon    2004  . 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 Renal and ureteric calculus    left    . Past Surgical History:  Procedure Laterality Date  . ABDOMINAL HYSTERECTOMY  1986  . APPENDECTOMY  1967  . BREAST EXCISIONAL BIOPSY Right 1984  . CATARACT EXTRACTION W/ INTRAOCULAR LENS IMPLANT Right 1990's   congential cataract  . CYSTOSCOPY W/ URETERAL STENT PLACEMENT  11/12/2015   Procedure: CYSTOSCOPY WITH RETROGRADE PYELOGRAM/URETERAL STENT PLACEMENT;  Surgeon: [name], MD;  Location: [facility];  Service: Urology;;  .  Consuela Mimes W/ URETERAL STENT REMOVAL Left 11/22/2015   Procedure: CYSTOSCOPY WITH STENT REMOVAL;  Surgeon: [name], MD;  Location: [facility];  Service: Urology;  Laterality: Left;  . CYSTOSCOPY/URETEROSCOPY/HOLMIUM LASER Left 11/22/2015   Procedure: CYSTOSCOPY/URETEROSCOPY/HOLMIUM LASER;  Surgeon: [name], MD;  Location: [facility];  Service: Urology;  Laterality: Left;  FLEX URETEROSCOPY  . FEMUR IM NAIL Left 08/04/2015   Procedure: INTRAMEDULLARY (IM) NAIL FEMORAL;  Surgeon: [name], MD;  Location: [facility];  Service: Orthopedics;  Laterality: Left;  . TONSILLECTOMY  age 30    . Social History   Tobacco Use  . Smoking status: Former Smoker    Packs/day: 1.00    Types: Cigarettes    Last attempt to quit: 08/03/2015    Years since quitting: 2.4  . Smokeless tobacco: Never Used  Substance Use Topics  . Alcohol use: Yes    Comment: occ  . Drug use: Not on file    ALLERGIES:  is allergic to other and shellfish allergy.  MEDICATIONS:  Current Outpatient Medications  Medication Sig Dispense Refill  . acetaminophen (TYLENOL) 325 MG tablet Take 2 tablets (650 mg total) by mouth every 6 (six) hours as needed for mild pain (or Fever >/= 101).    . Cyanocobalamin ([employer] B-12) 1000 MCG TBCR TAKE 1 TABLET DAILY 30 tablet 11  . docusate sodium (COLACE) 100 MG capsule  Take 1 capsule (100 mg total) by mouth daily.    . folic acid (FOLVITE) 1 MG tablet Take 1 tablet (1 mg total) by mouth daily. 90 tablet 11  . potassium chloride SA (K-DUR,KLOR-CON) 20 MEQ tablet Take 1 tablet (20 mEq total) by mouth 2 (two) times daily. F/u with PCP in 10-14 days to determine need for ongoing replacement 20 tablet 0  . nicotine (NICODERM CQ - DOSED IN MG/24 HOURS) 21 mg/24hr patch Place 1 patch (21 mg total) onto the skin daily. (Patient not taking: Reported on 01/20/2018) 28 patch 0   No current facility-administered medications for this visit.     PHYSICAL  EXAMINATION: ECOG PERFORMANCE STATUS: 1 - Symptomatic but completely ambulatory  Vitals:   01/20/18 1428  BP: (!) 135/36  Pulse: 69  Resp: 18  Temp: 98.9 F (37.2 C)  SpO2: 98%    Filed Weights   01/20/18 1428  Weight: 100 lb 4.8 oz (45.5 kg)   .Body mass index is 18.35 kg/m. [facility] GENERAL:alert, in no acute distress and comfortable SKIN: no acute rashes, no significant lesions EYES: conjunctiva are pink and non-injected, sclera anicteric OROPHARYNX: MMM, no exudates, no oropharyngeal erythema or ulceration NECK: supple, no JVD LYMPH:  no palpable lymphadenopathy in the cervical, axillary or inguinal regions LUNGS: clear to auscultation b/l with normal respiratory effort HEART: regular rate & rhythm ABDOMEN:  normoactive bowel sounds , non tender, not distended. Extremity: no pedal edema PSYCH: alert & oriented x 3 with fluent speech NEURO: no focal motor/sensory deficits  LABORATORY DATA:   I have reviewed the data as listed  . CBC Latest Ref Rng & Units 01/20/2018 11/20/2017 10/22/2017  WBC 3.9 - 10.3 K/uL 11.7(H) 9.0 10.8(H)  Hemoglobin 11.6 - 15.9 g/dL 6.7(LL) 7.1(L) 7.2(L)  Hematocrit 34.8 - 46.6 % 19.4(L) 20.6(L) 22.0(L)  Platelets 145 - 400 K/uL 380 401(H) 439(H)    CMP Latest Ref Rng & Units 01/20/2018 11/20/2017 10/22/2017  Glucose 70 - 140 mg/dL 93 79 87  BUN 7 - 26 mg/dL _0 Creatinine 0.60 - 1.10 mg/dL 0.64 0.66 0.72  Sodium 136 - 145 mmol/L 141 144 141  Potassium 3.5 - 5.1 mmol/L 3.6 3.5 3.5  Chloride 98 - 109 mmol/L 102 106 102  CO2 22 - 29 mmol/L 31(H) 29 31(H)  Calcium 8.4 - 10.4 mg/dL 9.2 8.9 8.9  Total Protein 6.4 - 8.3 g/dL 6.7 6.4 6.8  Total Bilirubin 0.2 - 1.2 mg/dL 3.0(H) 3.4(H) 2.7(H)  Alkaline Phos 40 - 150 U/L 104 107 113  AST 5 - 34 U/L _1 ALT 0 - 55 U/L _2 . Lab Results  Component Value Date   LDH 449 (H) 01/20/2018          RADIOGRAPHIC STUDIES: I have personally reviewed the radiological images as listed  and agreed with the findings in the report. Dg Lumbar Spine 2-3 Views  Result Date: 01/20/2018 CLINICAL DATA:  77 y/o F; 2 weeks of back pain. History of left hip fracture in 2017. EXAM: SACRUM AND COCCYX - 2+ VIEW; LUMBAR SPINE - 2-3 VIEW COMPARISON:  11/12/2015 CT abdomen and pelvis. FINDINGS: Sacrum and coccyx: There is no evidence of fracture or other focal bone lesions. Lumbar spine: Mild lumbar levocurvature with apex at T12. Normal lumbar lordosis without listhesis. Stable mild loss of height of T11 vertebral body. Otherwise vertebral body heights are preserved. Mild multilevel loss of intervertebral disc space height and small marginal  osteophytes. Lower lumbar predominant facet arthrosis. Abdominal aortic atherosclerosis. IMPRESSION: 1. No acute fracture or dislocation identified. 2. Stable chronic mild loss of height of T11 vertebral body. 3. Mild levocurvature of thoracolumbar junction. Mild spondylosis of visible lower thoracic and the lumbar spine. Electronically Signed   By: Kristine Garbe M.D.   On: 01/20/2018 16:45   Dg Sacrum/coccyx  Result Date: 01/20/2018 CLINICAL DATA:  77 y/o F; 2 weeks of back pain. History of left hip fracture in 2017. EXAM: SACRUM AND COCCYX - 2+ VIEW; LUMBAR SPINE - 2-3 VIEW COMPARISON:  11/12/2015 CT abdomen and pelvis. FINDINGS: Sacrum and coccyx: There is no evidence of fracture or other focal bone lesions. Lumbar spine: Mild lumbar levocurvature with apex at T12. Normal lumbar lordosis without listhesis. Stable mild loss of height of T11 vertebral body. Otherwise vertebral body heights are preserved. Mild multilevel loss of intervertebral disc space height and small marginal osteophytes. Lower lumbar predominant facet arthrosis. Abdominal aortic atherosclerosis. IMPRESSION: 1. No acute fracture or dislocation identified. 2. Stable chronic mild loss of height of T11 vertebral body. 3. Mild levocurvature of thoracolumbar junction. Mild spondylosis of  visible lower thoracic and the lumbar spine. Electronically Signed   By: Kristine Garbe M.D.   On: 01/20/2018 16:45    ASSESSMENT & PLAN:   #1: Cold Agglutinin related chronic hemolytic anemia. Her hemoglobin tends to be lower during winter and has been in the 6-8 range. Overall stable hemoglobin levels in the 6-7 range with folic acid replacement and cold avoidance.  Plan  -I discussed labwork today with pt and her family member. Her hg dropped to 6.7. -Patient wants to hold off on additional PRBC transfusion at this time. I explained her risk of hospitalization without transfusion and that she would have to be watched closer.  -She will let us know if she is having more symptoms of fatigue or dizziness and we will set her up for a PRBC transfusion. -She will forego transfusion today and I will see her in 2 weeks and if she starts to feel symptomatic before then she should go to the ED.  -stay warm and absolute cold avoidance  #2 IgM lambda MGUS - cannot rule out possibility of lymphoplasmacytic lymphoma though less likely given chronicity of her condition.  #3 history of previous B12 deficiency  Plan  -Continue cold avoidance aggressively. -Continue folic acid and V89 replacement as being done currently. -I discussed there other triggers of hemolysis than change in temperature.  -patient continues to prefer conservative approach with regards to additional workup and consideration of Rituxan. -With worsening anemia even while keeping warm I may  recommend getting a PET/CT scan and bone marrow examination to rule out associated lymphoplasmacytic lymphoma. If LPL present would treat accordingly. If no other associated findings with the cold agglutinin disease would consider treatment with Rituxan alone as monotherapy if needed. -Advised pt to let us know if she has worsening symptoms of weakness, tiredness, dizziness and lightheadedness and that we would likely perform a  transfusion then.  -I discussed there is a possibility treating her with Rituxan without bone marrow biopsy. Pt opted for this method.   #4 hypokalemia, resolved -Advised continuing her 24m potassium pill; potassium normalized  #5. Low back pain  -New onset with tenderness upon palpation on physical exam  -I recommended a xray to rule out compression fracture from osteoporosis today - X ray showed 1. No acute fracture or dislocation identified. 2. Stable chronic mild loss of height of  T11 vertebral body. 3. Mild levocurvature of thoracolumbar junction. Mild spondylosis of visible lower thoracic and the lumbar spine.  -MRI L spine ordered due to significant pain. -she was also recommended to get in touch with her ortho physician Dr Percell Miller for further evaluation of her back pain. -She will take tylenol and pain patch as needed.   #6 Patient Active Problem List   Diagnosis Date Noted  . Ureteral calculus 11/12/2015  . Breast cancer screening, high risk patient 08/29/2015  . Acute delirium 08/07/2015  . Postoperative anemia due to acute blood loss 08/06/2015  . Protein-calorie malnutrition, severe 08/04/2015  . Closed intertrochanteric fracture of left femur ([place]) 08/03/2015  . Fall 08/03/2015  . Closed right hip fracture ([place]) 08/03/2015  . Hyperbilirubinemia 08/01/2013  . Hypokalemia 07/30/2013  . Vitamin B 12 deficiency 08/07/2012  . 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 Tobacco abuse 06/10/2012   -Continue follow-up with primary care physician for management of other medical comorbidities   Lumbo-sacral spine Xray today MRI L spine ASAP RTC with Dr Irene Limbo with labs in 2 weeks   The total time spent in the appointment was 25 minutes and more than 50% was on counseling and direct patient cares.  Sullivan Lone MD [place] Lawns AAHIVMS [facility] Hematology/Oncology [facility]  (Office):       [phone] (Work cell):  [phone] (Fax):            [phone]  This document serves as a record of services personally performed by Sullivan Lone, MD. It was created on his behalf by Joslyn Devon, a trained medical scribe. The creation of this record is based on the scribe's personal observations and the provider's statements to them.    .I have reviewed the above documentation for accuracy and completeness, and I agree with the above.  Brunetta Genera MD MS

## 2018-01-20 ENCOUNTER — Inpatient Hospital Stay: Payer: Medicare Other

## 2018-01-20 ENCOUNTER — Inpatient Hospital Stay: Payer: Medicare Other | Attending: Hematology | Admitting: Hematology

## 2018-01-20 ENCOUNTER — Other Ambulatory Visit: Payer: Self-pay

## 2018-01-20 ENCOUNTER — Ambulatory Visit (HOSPITAL_COMMUNITY)
Admission: RE | Admit: 2018-01-20 | Discharge: 2018-01-20 | Disposition: A | Discharge status: Home / Self Care | Payer: Medicare Other | Source: Ambulatory Visit | Attending: Hematology | Admitting: Hematology

## 2018-01-20 ENCOUNTER — Encounter: Payer: Self-pay | Admitting: Hematology

## 2018-01-20 ENCOUNTER — Telehealth: Payer: Self-pay | Admitting: Hematology

## 2018-01-20 VITALS — BP 135/36 | HR 69 | Temp 98.9°F | Resp 18 | Ht 62.0 in | Wt 100.3 lb

## 2018-01-20 DIAGNOSIS — E538 Deficiency of other specified B group vitamins: Secondary | ICD-10-CM | POA: Diagnosis not present

## 2018-01-20 DIAGNOSIS — I6523 Occlusion and stenosis of bilateral carotid arteries: Secondary | ICD-10-CM | POA: Diagnosis not present

## 2018-01-20 DIAGNOSIS — D5912 Cold autoimmune hemolytic anemia: Secondary | ICD-10-CM

## 2018-01-20 DIAGNOSIS — E876 Hypokalemia: Secondary | ICD-10-CM | POA: Diagnosis not present

## 2018-01-20 DIAGNOSIS — M4056 Lordosis, unspecified, lumbar region: Secondary | ICD-10-CM

## 2018-01-20 DIAGNOSIS — M47894 Other spondylosis, thoracic region: Secondary | ICD-10-CM | POA: Insufficient documentation

## 2018-01-20 DIAGNOSIS — J449 Chronic obstructive pulmonary disease, unspecified: Secondary | ICD-10-CM | POA: Diagnosis not present

## 2018-01-20 DIAGNOSIS — Z79899 Other long term (current) drug therapy: Secondary | ICD-10-CM | POA: Insufficient documentation

## 2018-01-20 DIAGNOSIS — D591 Other autoimmune hemolytic anemias: Secondary | ICD-10-CM

## 2018-01-20 DIAGNOSIS — Z87442 Personal history of urinary calculi: Secondary | ICD-10-CM | POA: Diagnosis not present

## 2018-01-20 DIAGNOSIS — M545 Low back pain: Secondary | ICD-10-CM | POA: Diagnosis not present

## 2018-01-20 DIAGNOSIS — E43 Unspecified severe protein-calorie malnutrition: Secondary | ICD-10-CM

## 2018-01-20 DIAGNOSIS — M5441 Lumbago with sciatica, right side: Secondary | ICD-10-CM | POA: Diagnosis not present

## 2018-01-20 DIAGNOSIS — M47896 Other spondylosis, lumbar region: Secondary | ICD-10-CM | POA: Insufficient documentation

## 2018-01-20 DIAGNOSIS — D472 Monoclonal gammopathy: Secondary | ICD-10-CM

## 2018-01-20 DIAGNOSIS — M5442 Lumbago with sciatica, left side: Secondary | ICD-10-CM | POA: Diagnosis not present

## 2018-01-20 DIAGNOSIS — Z87891 Personal history of nicotine dependence: Secondary | ICD-10-CM | POA: Diagnosis not present

## 2018-01-20 LAB — CBC WITH DIFFERENTIAL (CANCER CENTER ONLY)
BASOS PCT: 1 %
Basophils Absolute: 0.1 10*3/uL (ref 0.0–0.1)
EOS ABS: 0 10*3/uL (ref 0.0–0.5)
Eosinophils Relative: 0 %
HCT: 19.4 % — ABNORMAL LOW (ref 34.8–46.6)
Hemoglobin: 6.7 g/dL — CL (ref 11.6–15.9)
LYMPHS ABS: 3.6 10*3/uL — AB (ref 0.9–3.3)
Lymphocytes Relative: 31 %
MCH: 38.5 pg — AB (ref 25.1–34.0)
MCHC: 34.5 g/dL (ref 31.5–36.0)
MCV: 111.5 fL — ABNORMAL HIGH (ref 79.5–101.0)
MONOS PCT: 5 %
Monocytes Absolute: 0.6 10*3/uL (ref 0.1–0.9)
Neutro Abs: 7.4 10*3/uL — ABNORMAL HIGH (ref 1.5–6.5)
Neutrophils Relative %: 63 %
PLATELETS: 380 10*3/uL (ref 145–400)
RBC: 1.74 MIL/uL — ABNORMAL LOW (ref 3.70–5.45)
RDW: 18.6 % — ABNORMAL HIGH (ref 11.2–14.5)
WBC Count: 11.7 10*3/uL — ABNORMAL HIGH (ref 3.9–10.3)

## 2018-01-20 LAB — CMP (CANCER CENTER ONLY)
ALT: 10 U/L (ref 0–55)
ANION GAP: 8 (ref 3–11)
AST: 29 U/L (ref 5–34)
Albumin: 4.3 g/dL (ref 3.5–5.0)
Alkaline Phosphatase: 104 U/L (ref 40–150)
BUN: 10 mg/dL (ref 7–26)
CALCIUM: 9.2 mg/dL (ref 8.4–10.4)
CHLORIDE: 102 mmol/L (ref 98–109)
CO2: 31 mmol/L — AB (ref 22–29)
Creatinine: 0.64 mg/dL (ref 0.60–1.10)
GFR, Estimated: >60 mL/min (ref >60)
Glucose, Bld: 93 mg/dL (ref 70–140)
Potassium: 3.6 mmol/L (ref 3.5–5.1)
SODIUM: 141 mmol/L (ref 136–145)
Total Bilirubin: 3 mg/dL — ABNORMAL HIGH (ref 0.2–1.2)
Total Protein: 6.7 g/dL (ref 6.4–8.3)

## 2018-01-20 LAB — LACTATE DEHYDROGENASE: LDH: 449 U/L — AB (ref 125–245)

## 2018-01-20 LAB — RETICULOCYTES
RBC.: 1.74 MIL/uL — AB (ref 3.70–5.45)
RETIC COUNT ABSOLUTE: 121.8 10*3/uL — AB (ref 33.7–90.7)
Retic Ct Pct: 7 % — ABNORMAL HIGH (ref 0.7–2.1)

## 2018-01-20 NOTE — Telephone Encounter (Signed)
Scheduled appt per 4/30 los - Gave patient AVS and calender per los.  

## 2018-01-22 LAB — MULTIPLE MYELOMA PANEL, SERUM
ALBUMIN SERPL ELPH-MCNC: 3.9 g/dL (ref 2.9–4.4)
ALPHA2 GLOB SERPL ELPH-MCNC: 0.5 g/dL (ref 0.4–1.0)
Albumin/Glob SerPl: 1.7 (ref 0.7–1.7)
Alpha 1: 0.3 g/dL (ref 0.0–0.4)
B-Globulin SerPl Elph-Mcnc: 1.3 g/dL (ref 0.7–1.3)
Gamma Glob SerPl Elph-Mcnc: 0.4 g/dL (ref 0.4–1.8)
Globulin, Total: 2.4 g/dL (ref 2.2–3.9)
IGG (IMMUNOGLOBIN G), SERUM: 394 mg/dL — AB (ref 700–1600)
IGM (IMMUNOGLOBULIN M), SRM: 852 mg/dL — AB (ref 26–217)
IgA: 100 mg/dL (ref 64–422)
M Protein SerPl Elph-Mcnc: 0.5 g/dL — ABNORMAL HIGH
TOTAL PROTEIN ELP: 6.3 g/dL (ref 6.0–8.5)

## 2018-01-23 ENCOUNTER — Telehealth: Payer: Self-pay

## 2018-01-23 NOTE — Telephone Encounter (Signed)
Called pt phone and spoke with pt sister to relay message. Pt spoke with DR. Irene Limbo yesterday regarding scan results of back. MRI of lumbar spine ordered. Gave pt sister number to call Central Scheduling [phone] to get MRI scheduled. Pt to additionally f/u with orthopaedic MD.

## 2018-01-29 ENCOUNTER — Ambulatory Visit (HOSPITAL_COMMUNITY)
Admission: RE | Admit: 2018-01-29 | Discharge: 2018-01-29 | Disposition: A | Discharge status: Home / Self Care | Payer: Medicare Other | Source: Ambulatory Visit | Attending: Hematology | Admitting: Hematology

## 2018-01-29 DIAGNOSIS — X58XXXA Exposure to other specified factors, initial encounter: Secondary | ICD-10-CM | POA: Insufficient documentation

## 2018-01-29 DIAGNOSIS — M5442 Lumbago with sciatica, left side: Secondary | ICD-10-CM | POA: Insufficient documentation

## 2018-01-29 DIAGNOSIS — M5441 Lumbago with sciatica, right side: Secondary | ICD-10-CM | POA: Insufficient documentation

## 2018-01-29 DIAGNOSIS — S32049A Unspecified fracture of fourth lumbar vertebra, initial encounter for closed fracture: Secondary | ICD-10-CM | POA: Diagnosis not present

## 2018-01-29 DIAGNOSIS — Z8781 Personal history of (healed) traumatic fracture: Secondary | ICD-10-CM | POA: Insufficient documentation

## 2018-02-02 NOTE — Progress Notes (Signed)
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 The patient's last visit with [place] was on 01/20/18. She is accompanied today by her daughter.   The pt reports feeling quite cold. She notes that in June one summer in the past she had Hgb that dropped down into the 4s like it is today. She notes that 30 years ago, her Hgb had routinely been in the 10-12 range.   She notes that in the days leading to our visit, she noticed some increased weakness.  She notes that her back pain is being addressed with a pain patch and she is hoping to see her orthopedist soon.   Of note since the patient's last visit, pt has had MR Lumbar  completed on 01/30/18 with results revealing Acute/subacute superior endplate fracture at L4 with loss of height of 10-20%. No retropulsed bone. This is quite likely the cause of the low back pain. This was probably present on the radiographs of 01/20/2018, but not diagnosable because of the spinal curvature and unlevel endplates. Old healed superior endplate fracture at W54.  Lab results today (02/04/18) of CBC, CMP, and Reticulocytes is as follows: all values are WNL except for WBC at 11.4k, RBC at 1.18, Hgb at 4.6, HCT at 13.0, MCV at 110.2, MCH at 39.0, RDW at 21.5, Platelets at 515k, Lymphs Abs at 4.3k, Potassium at 3.0, CO2 at 31, Total Bilirubin at 2.9.  On review of systems, pt reports being cold, fatigued, weakness, and denies any other symptoms.    REVIEW OF SYSTEMS:   A 10+ POINT REVIEW OF SYSTEMS WAS OBTAINED including neurology, dermatology, psychiatry, cardiac, respiratory, lymph, extremities, GI, GU, Musculoskeletal,  constitutional, breasts, reproductive, HEENT.  All pertinent positives are noted in the HPI.  All others are negative.   Past Medical History:  Diagnosis Date  . B12 deficiency   . Bilateral carotid artery stenosis    right CCA and ICA 1-39%/    left ICA 40-59%  per last duplex  10-19-2015  . COPD (chronic obstructive pulmonary disease) ([place])   . Diverticulosis of colon   . History of diverticulitis of colon    2004  . 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 Renal and ureteric calculus    left    . Past Surgical History:  Procedure Laterality Date  . ABDOMINAL HYSTERECTOMY  1986  . APPENDECTOMY  1967  . BREAST EXCISIONAL BIOPSY Right 1984  . CATARACT EXTRACTION W/ INTRAOCULAR LENS IMPLANT Right 1990's   congential cataract  . CYSTOSCOPY W/ URETERAL STENT PLACEMENT  11/12/2015   Procedure: CYSTOSCOPY WITH RETROGRADE PYELOGRAM/URETERAL STENT PLACEMENT;  Surgeon: [name], MD;  Location: [facility];  Service: Urology;;  . Consuela Mimes W/ URETERAL STENT REMOVAL Left 11/22/2015   Procedure: CYSTOSCOPY WITH STENT REMOVAL;  Surgeon: [name], MD;  Location: [facility];  Service: Urology;  Laterality: Left;  . CYSTOSCOPY/URETEROSCOPY/HOLMIUM LASER Left 11/22/2015   Procedure: CYSTOSCOPY/URETEROSCOPY/HOLMIUM LASER;  Surgeon: [name], MD;  Location: [facility];  Service: Urology;  Laterality: Left;  FLEX URETEROSCOPY  . FEMUR IM NAIL Left 08/04/2015   Procedure: INTRAMEDULLARY (IM)  NAIL FEMORAL;  Surgeon: [name], MD;  Location: [place];  Service: Orthopedics;  Laterality: Left;  . TONSILLECTOMY  age 33    . Social History   Tobacco Use  . Smoking status: Former Smoker    Packs/day: 1.00    Types: Cigarettes    Last attempt to quit: 08/03/2015    Years since quitting: 2.5  . Smokeless tobacco: Never Used  Substance Use Topics  . Alcohol use: Yes    Comment: occ  . Drug use: Not on file     ALLERGIES:  is allergic to other and shellfish allergy.  MEDICATIONS:  Current Outpatient Medications  Medication Sig Dispense Refill  . acetaminophen (TYLENOL) 325 MG tablet Take 2 tablets (650 mg total) by mouth every 6 (six) hours as needed for mild pain (or Fever >/= 101).    . Cyanocobalamin ([employer] B-12) 1000 MCG TBCR TAKE 1 TABLET DAILY 30 tablet 11  . docusate sodium (COLACE) 100 MG capsule Take 1 capsule (100 mg total) by mouth daily.    . folic acid (FOLVITE) 1 MG tablet Take 1 tablet (1 mg total) by mouth daily. 90 tablet 11  . potassium chloride SA (K-DUR,KLOR-CON) 20 MEQ tablet Take 1 tablet (20 mEq total) by mouth 2 (two) times daily. F/u with PCP in 10-14 days to determine need for ongoing replacement 20 tablet 0  . nicotine (NICODERM CQ - DOSED IN MG/24 HOURS) 21 mg/24hr patch Place 1 patch (21 mg total) onto the skin daily. (Patient not taking: Reported on 02/04/2018) 28 patch 0   No current facility-administered medications for this visit.     PHYSICAL EXAMINATION: ECOG PERFORMANCE STATUS: 1 - Symptomatic but completely ambulatory  Vitals:   02/04/18 1521  BP: (!) 131/41  Pulse: 88  Resp: 19  Temp: 98.4 F (36.9 C)  SpO2: 98%    Filed Weights   02/04/18 1521  Weight: 100 lb 9.6 oz (45.6 kg)   .Body mass index is 18.4 kg/m.  GENERAL:alert, in no acute distress and comfortable SKIN: no acute rashes, no significant lesions EYES: conjunctiva are pink and non-injected, sclera anicteric OROPHARYNX: MMM, no exudates, no oropharyngeal erythema or ulceration NECK: supple, no JVD LYMPH:  no palpable lymphadenopathy in the cervical, axillary or inguinal regions LUNGS: clear to auscultation b/l with normal respiratory effort HEART: regular rate & rhythm ABDOMEN:  normoactive bowel sounds , non tender, not distended. Extremity: no pedal edema PSYCH: alert & oriented x 3 with fluent speech NEURO: no focal motor/sensory deficits    LABORATORY DATA:   I have  reviewed the data as listed  . CBC Latest Ref Rng & Units 02/04/2018 01/20/2018 11/20/2017  WBC 3.9 - 10.3 K/uL 11.4(H) 11.7(H) 9.0  Hemoglobin 11.6 - 15.9 g/dL 4.6(LL) 6.7(LL) 7.1(L)  Hematocrit 34.8 - 46.6 % 13.0(L) 19.4(L) 20.6(L)  Platelets 145 - 400 K/uL 515(H) 380 401(H)    CMP Latest Ref Rng & Units 02/04/2018 01/20/2018 11/20/2017  Glucose 70 - 140 mg/dL 74 93 79  BUN 7 - 26 mg/dL '10 10 11  ' Creatinine 0.60 - 1.10 mg/dL 0.67 0.64 0.66  Sodium 136 - 145 mmol/L 139 141 144  Potassium 3.5 - 5.1 mmol/L 3.0(LL) 3.6 3.5  Chloride 98 - 109 mmol/L 101 102 106  CO2 22 - 29 mmol/L 31(H) 31(H) 29  Calcium 8.4 - 10.4 mg/dL 8.7 9.2 8.9  Total Protein 6.4 - 8.3 g/dL 6.4 6.7 6.4  Total Bilirubin 0.2 - 1.2 mg/dL 2.9(H) 3.0(H) 3.4(H)  Alkaline  Phos 40 - 150 U/L 110 104 107  AST 5 - 34 U/L '17 29 20  ' ALT 0 - 55 U/L '7 10 11   ' . Lab Results  Component Value Date   LDH 449 (H) 01/20/2018          RADIOGRAPHIC STUDIES: I have personally reviewed the radiological images as listed and agreed with the findings in the report. Dg Lumbar Spine 2-3 Views  Result Date: 01/20/2018 CLINICAL DATA:  76 y/o F; 2 weeks of back pain. History of left hip fracture in 2017. EXAM: SACRUM AND COCCYX - 2+ VIEW; LUMBAR SPINE - 2-3 VIEW COMPARISON:  11/12/2015 CT abdomen and pelvis. FINDINGS: Sacrum and coccyx: There is no evidence of fracture or other focal bone lesions. Lumbar spine: Mild lumbar levocurvature with apex at T12. Normal lumbar lordosis without listhesis. Stable mild loss of height of T11 vertebral body. Otherwise vertebral body heights are preserved. Mild multilevel loss of intervertebral disc space height and small marginal osteophytes. Lower lumbar predominant facet arthrosis. Abdominal aortic atherosclerosis. IMPRESSION: 1. No acute fracture or dislocation identified. 2. Stable chronic mild loss of height of T11 vertebral body. 3. Mild levocurvature of thoracolumbar junction. Mild spondylosis of  visible lower thoracic and the lumbar spine. Electronically Signed   By: Kristine Garbe M.D.   On: 01/20/2018 16:45   Dg Sacrum/coccyx  Result Date: 01/20/2018 CLINICAL DATA:  77 y/o F; 2 weeks of back pain. History of left hip fracture in 2017. EXAM: SACRUM AND COCCYX - 2+ VIEW; LUMBAR SPINE - 2-3 VIEW COMPARISON:  11/12/2015 CT abdomen and pelvis. FINDINGS: Sacrum and coccyx: There is no evidence of fracture or other focal bone lesions. Lumbar spine: Mild lumbar levocurvature with apex at T12. Normal lumbar lordosis without listhesis. Stable mild loss of height of T11 vertebral body. Otherwise vertebral body heights are preserved. Mild multilevel loss of intervertebral disc space height and small marginal osteophytes. Lower lumbar predominant facet arthrosis. Abdominal aortic atherosclerosis. IMPRESSION: 1. No acute fracture or dislocation identified. 2. Stable chronic mild loss of height of T11 vertebral body. 3. Mild levocurvature of thoracolumbar junction. Mild spondylosis of visible lower thoracic and the lumbar spine. Electronically Signed   By: Kristine Garbe M.D.   On: 01/20/2018 16:45   Mr Lumbar Spine Wo Contrast  Result Date: 01/30/2018 CLINICAL DATA:  Low back pain over the last 3 weeks. EXAM: MRI LUMBAR SPINE WITHOUT CONTRAST TECHNIQUE: Multiplanar, multisequence MR imaging of the lumbar spine was performed. No intravenous contrast was administered. COMPARISON:  Radiography 01/20/2018 FINDINGS: Segmentation:  5 lumbar type vertebral bodies. Alignment:  Curvature convex to the left. Vertebrae: Acute/subacute superior endplate fracture at L4 with loss of height of about 10-20%. Minimal posterior bowing of the posterosuperior margin of the vertebral body but no significant encroachment upon the canal or neural structures. This was not visible on the radiography because of the scoliotic curvature and unlevel endplates. In retrospect, this was probably present at that time. There  is an old healed superior endplate fracture at E56 with loss of height of 20%. In general, the marrow pattern is somewhat hypercellular, presumed normal variation. Conus medullaris and cauda equina: Conus extends to the L1 level. Conus and cauda equina appear normal. Paraspinal and other soft tissues: Negative Disc levels: No significant disc pathology. Minimal, non-compressive disc bulges at L2-3, L3-4 and L4-5. IMPRESSION: Acute/subacute superior endplate fracture at L4 with loss of height of 10-20%. No retropulsed bone. This is quite likely the cause of the low  back pain. This was probably present on the radiographs of 01/20/2018, but not diagnosable because of the spinal curvature and unlevel endplates. Old healed superior endplate fracture at X50. Electronically Signed   By: Nelson Chimes M.D.   On: 01/30/2018 09:45    ASSESSMENT & PLAN:   #1: Cold Agglutinin related chronic hemolytic anemia. Her hemoglobin tends to be lower during winter and has been in the 6-8 range. Overall stable hemoglobin levels in the 6-7 range with folic acid replacement and cold avoidance.  #2 IgM lambda MGUS - cannot rule out possibility of lymphoplasmacytic lymphoma though less likely given chronicity of her condition.  #3 history of previous B12 deficiency  Plan   -Discussed pt labwork today, 02/04/18; Hgb at 4.6, Potassium at 3.0.  -Pt needs to be triaged in ED given Hgb at 4.6 -Discussed with pt that the frequency of her transfusions is  strongly suggestive for beginning treatment of her cold agglutinin hemolytic anemia with Rituxan.  -Pt needs a transfusion to get Hgb at least above 6 -Pt will need to see her orthopedist Dr. Percell Miller soon concerning her L4 fx  -ALL TRANSFUSION PRODUCTS SHOULD BE PRE-WARMED USING A BLOOD WARMER INCLUDING IV FLUIDS, IF ANY. -Continue cold avoidance aggressively. -Continue folic acid and V69 replacement as being done currently. -I discussed there other triggers of hemolysis than  change in temperature.  -I discussed there is a possibility treating her with Rituxan without bone marrow biopsy. Pt opted for this method.   #4 hypokalemia -Advised continuing her 99m potassium pill; potassium normalized  #5. Low back pain  -New onset with tenderness upon palpation on physical exam  -I recommended a xray to rule out compression fracture from osteoporosis today - X ray showed 1. No acute fracture or dislocation identified. 2. Stable chronic mild loss of height of T11 vertebral body. 3. Mild levocurvature of thoracolumbar junction. Mild spondylosis of visible lower thoracic and the lumbar spine.  -MRI L spine -Acute/subacute superior endplate fracture at L4 with loss of height of 10-20%. No retropulsed bone. This is quite likely the cause of the low back pain. This was probably present on the radiographs of 01/20/2018, but not diagnosable because of the spinal curvature and unlevel endplates. Old healed superior endplate fracture at TV94 -she was also recommended to get in touch with her ortho physician Dr MPercell Millerfor further evaluation of her back pain. -She will take tylenol and pain patch as needed.  -f/u with PCP/ortho for evaluation and management of Osteoporosis.  #6 Patient Active Problem List   Diagnosis Date Noted  . Ureteral calculus 11/12/2015  . Breast cancer screening, high risk patient 08/29/2015  . Acute delirium 08/07/2015  . Postoperative anemia due to acute blood loss 08/06/2015  . Protein-calorie malnutrition, severe 08/04/2015  . Cl[place]teric [place] ([place] 08/03[place]  . Fall 08/03/2015  . [place] ([place] 08/03[place]  . Hyperbilirubinemia 08/01/2013  . Hypokalemia 07/30/2013  . Vitamin B 12 deficiency 08/07/2012  . 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[place] 06/10/2012  . Tobacco abuse 06/10/2012   -Continue follow-up with primary care physician for management of other medical comorbidities  -ALL  TRANSFUSION PRODUCTS SHOULD BE PRE-WARMED USING A BLOOD WARMER INCLUDING IV FLUIDS, IF ANY.    Transfer to ER for acute treatment of symptomatic severe anemia RTC with Dr KIrene Limboin 2 weeks with labs    The toal time spent in the appt was 25 minutes and more than 50% was on  counseling and direct patient cares and co-ordinating cares with ED.  Sullivan Lone MD [place] AAHIVMS [facility] Hematology/Oncology Physician [facility]  (Office):       [phone] (Work cell):  [phone] (Fax):           [phone]  This document serves as a record of services personally performed by Sullivan Lone, MD. It was created on his behalf by Baldwin [place], a trained medical scribe. The creation of this record is based on the scribe's personal observations and the provider's statements to them.   .I have reviewed the above documentation for accuracy and completeness, and I agree with the above. Brunetta Genera MD Elaine

## 2018-02-04 ENCOUNTER — Encounter (HOSPITAL_COMMUNITY): Payer: Self-pay | Admitting: Emergency Medicine

## 2018-02-04 ENCOUNTER — Inpatient Hospital Stay: Payer: Medicare Other

## 2018-02-04 ENCOUNTER — Other Ambulatory Visit: Payer: Self-pay

## 2018-02-04 ENCOUNTER — Inpatient Hospital Stay: Payer: Medicare Other | Attending: Hematology | Admitting: Hematology

## 2018-02-04 ENCOUNTER — Observation Stay (HOSPITAL_COMMUNITY)
Admission: EM | Admit: 2018-02-04 | Discharge: 2018-02-05 | Disposition: A | Discharge status: Home / Self Care | Payer: Medicare Other | Attending: Internal Medicine | Admitting: Internal Medicine

## 2018-02-04 ENCOUNTER — Encounter: Payer: Self-pay | Admitting: Hematology

## 2018-02-04 ENCOUNTER — Emergency Department (HOSPITAL_COMMUNITY): Payer: Medicare Other

## 2018-02-04 VITALS — BP 131/41 | HR 88 | Temp 98.4°F | Resp 19 | Ht 62.0 in | Wt 100.6 lb

## 2018-02-04 DIAGNOSIS — E876 Hypokalemia: Secondary | ICD-10-CM | POA: Insufficient documentation

## 2018-02-04 DIAGNOSIS — D591 Other autoimmune hemolytic anemias: Principal | ICD-10-CM

## 2018-02-04 DIAGNOSIS — Z79899 Other long term (current) drug therapy: Secondary | ICD-10-CM

## 2018-02-04 DIAGNOSIS — M549 Dorsalgia, unspecified: Secondary | ICD-10-CM | POA: Diagnosis not present

## 2018-02-04 DIAGNOSIS — E538 Deficiency of other specified B group vitamins: Secondary | ICD-10-CM

## 2018-02-04 DIAGNOSIS — D5912 Cold autoimmune hemolytic anemia: Secondary | ICD-10-CM

## 2018-02-04 DIAGNOSIS — K5730 Diverticulosis of large intestine without perforation or abscess without bleeding: Secondary | ICD-10-CM | POA: Insufficient documentation

## 2018-02-04 DIAGNOSIS — R195 Other fecal abnormalities: Secondary | ICD-10-CM | POA: Diagnosis present

## 2018-02-04 DIAGNOSIS — Z87442 Personal history of urinary calculi: Secondary | ICD-10-CM | POA: Insufficient documentation

## 2018-02-04 DIAGNOSIS — Z8781 Personal history of (healed) traumatic fracture: Secondary | ICD-10-CM

## 2018-02-04 DIAGNOSIS — R531 Weakness: Secondary | ICD-10-CM | POA: Insufficient documentation

## 2018-02-04 DIAGNOSIS — I6523 Occlusion and stenosis of bilateral carotid arteries: Secondary | ICD-10-CM | POA: Diagnosis not present

## 2018-02-04 DIAGNOSIS — K8020 Calculus of gallbladder without cholecystitis without obstruction: Secondary | ICD-10-CM | POA: Diagnosis not present

## 2018-02-04 DIAGNOSIS — Z87891 Personal history of nicotine dependence: Secondary | ICD-10-CM | POA: Insufficient documentation

## 2018-02-04 DIAGNOSIS — J449 Chronic obstructive pulmonary disease, unspecified: Secondary | ICD-10-CM | POA: Diagnosis not present

## 2018-02-04 DIAGNOSIS — D649 Anemia, unspecified: Secondary | ICD-10-CM

## 2018-02-04 DIAGNOSIS — M5126 Other intervertebral disc displacement, lumbar region: Secondary | ICD-10-CM | POA: Diagnosis not present

## 2018-02-04 DIAGNOSIS — S32040A Wedge compression fracture of fourth lumbar vertebra, initial encounter for closed fracture: Secondary | ICD-10-CM | POA: Diagnosis not present

## 2018-02-04 DIAGNOSIS — Z8719 Personal history of other diseases of the digestive system: Secondary | ICD-10-CM | POA: Insufficient documentation

## 2018-02-04 DIAGNOSIS — I700 Atherosclerosis of aorta: Secondary | ICD-10-CM | POA: Diagnosis not present

## 2018-02-04 DIAGNOSIS — M4056 Lordosis, unspecified, lumbar region: Secondary | ICD-10-CM | POA: Diagnosis not present

## 2018-02-04 LAB — RETICULOCYTES
RBC.: 1.18 MIL/uL — AB (ref 3.70–5.45)
Retic Count, Absolute: 116.8 10*3/uL — ABNORMAL HIGH (ref 33.7–90.7)
Retic Ct Pct: 9.9 % — ABNORMAL HIGH (ref 0.7–2.1)

## 2018-02-04 LAB — URINALYSIS, ROUTINE W REFLEX MICROSCOPIC
Bacteria, UA: NONE SEEN
Bilirubin Urine: NEGATIVE
Glucose, UA: NEGATIVE mg/dL
Ketones, ur: NEGATIVE mg/dL
Leukocytes, UA: NEGATIVE
Nitrite: NEGATIVE
Protein, ur: NEGATIVE mg/dL
Specific Gravity, Urine: 1.005 (ref 1.005–1.030)
pH: 6 (ref 5.0–8.0)

## 2018-02-04 LAB — CMP (CANCER CENTER ONLY)
ALBUMIN: 4 g/dL (ref 3.5–5.0)
ALK PHOS: 110 U/L (ref 40–150)
ALT: 7 U/L (ref 0–55)
AST: 17 U/L (ref 5–34)
Anion gap: 7 (ref 3–11)
BILIRUBIN TOTAL: 2.9 mg/dL — AB (ref 0.2–1.2)
BUN: 10 mg/dL (ref 7–26)
CO2: 31 mmol/L — AB (ref 22–29)
Calcium: 8.7 mg/dL (ref 8.4–10.4)
Chloride: 101 mmol/L (ref 98–109)
Creatinine: 0.67 mg/dL (ref 0.60–1.10)
GFR, Est AFR Am: >60 mL/min (ref >60)
GFR, Estimated: >60 mL/min (ref >60)
GLUCOSE: 74 mg/dL (ref 70–140)
POTASSIUM: 3 mmol/L — AB (ref 3.5–5.1)
Sodium: 139 mmol/L (ref 136–145)
TOTAL PROTEIN: 6.4 g/dL (ref 6.4–8.3)

## 2018-02-04 LAB — CBC WITH DIFFERENTIAL/PLATELET
BASOS ABS: 0 10*3/uL (ref 0.0–0.1)
Basophils Absolute: 0 10*3/uL (ref 0.0–0.1)
Basophils Relative: 0 %
Basophils Relative: 0 %
EOS PCT: 0 %
Eosinophils Absolute: 0.1 10*3/uL (ref 0.0–0.5)
Eosinophils Absolute: 0.1 10*3/uL (ref 0.0–0.7)
Eosinophils Relative: 1 %
HCT: 13 % — ABNORMAL LOW (ref 34.8–46.6)
HCT: 12 % — ABNORMAL LOW (ref 36.0–46.0)
Hemoglobin: 4.6 g/dL — CL (ref 11.6–15.9)
Hemoglobin: 4.3 g/dL — CL (ref 12.0–15.0)
Lymphocytes Relative: 37 %
Lymphocytes Relative: 42 %
Lymphs Abs: 4.3 10*3/uL — ABNORMAL HIGH (ref 0.9–3.3)
Lymphs Abs: 4.4 10*3/uL — ABNORMAL HIGH (ref 0.7–4.0)
MCH: 39 pg — AB (ref 25.1–34.0)
MCH: 37.4 pg — ABNORMAL HIGH (ref 26.0–34.0)
MCHC: 35.4 g/dL (ref 31.5–36.0)
MCHC: 35.8 g/dL (ref 30.0–36.0)
MCV: 110.2 fL — AB (ref 79.5–101.0)
MCV: 104.3 fL — ABNORMAL HIGH (ref 78.0–100.0)
MONO ABS: 0.7 10*3/uL (ref 0.1–0.9)
MONOS PCT: 6 %
Monocytes Absolute: 0.5 10*3/uL (ref 0.1–1.0)
Monocytes Relative: 5 %
Neutro Abs: 6.4 10*3/uL (ref 1.5–6.5)
Neutro Abs: 5.4 10*3/uL (ref 1.7–7.7)
Neutrophils Relative %: 57 %
Neutrophils Relative %: 52 %
Platelets: 515 10*3/uL — ABNORMAL HIGH (ref 145–400)
Platelets: 501 10*3/uL — ABNORMAL HIGH (ref 150–400)
RBC: 1.18 MIL/uL — ABNORMAL LOW (ref 3.70–5.45)
RBC: 1.15 MIL/uL — ABNORMAL LOW (ref 3.87–5.11)
RDW: 21.5 % — AB (ref 11.2–14.5)
RDW: 15.6 % — ABNORMAL HIGH (ref 11.5–15.5)
WBC: 11.4 10*3/uL — ABNORMAL HIGH (ref 3.9–10.3)
WBC: 10.4 10*3/uL (ref 4.0–10.5)
nRBC: 1 /100 WBC — ABNORMAL HIGH

## 2018-02-04 LAB — PREPARE RBC (CROSSMATCH)

## 2018-02-04 LAB — BASIC METABOLIC PANEL
Anion gap: 13 (ref 5–15)
BUN: 10 mg/dL (ref 6–20)
CO2: 26 mmol/L (ref 22–32)
Calcium: 8.8 mg/dL — ABNORMAL LOW (ref 8.9–10.3)
Chloride: 102 mmol/L (ref 101–111)
Creatinine, Ser: 0.61 mg/dL (ref 0.44–1.00)
GFR calc Af Amer: >60 mL/min (ref >60)
GFR calc non Af Amer: >60 mL/min (ref >60)
Glucose, Bld: 114 mg/dL — ABNORMAL HIGH (ref 65–99)
Potassium: 3.3 mmol/L — ABNORMAL LOW (ref 3.5–5.1)
Sodium: 141 mmol/L (ref 135–145)

## 2018-02-04 LAB — POC OCCULT BLOOD, ED: Fecal Occult Bld: POSITIVE — AB

## 2018-02-04 LAB — MAGNESIUM: Magnesium: 1.9 mg/dL (ref 1.7–2.4)

## 2018-02-04 LAB — LIPASE, BLOOD: LIPASE: 24 U/L (ref 11–51)

## 2018-02-04 LAB — SAMPLE TO BLOOD BANK

## 2018-02-04 MED ORDER — ACETAMINOPHEN 650 MG RE SUPP: 650.0000 mg | Freq: Four times a day (QID) | RECTAL | Status: DC | PRN

## 2018-02-04 MED ORDER — POTASSIUM CHLORIDE CRYS ER 20 MEQ PO TBCR: 20.0000 meq | EXTENDED_RELEASE_TABLET | Freq: Two times a day (BID) | ORAL | Status: DC

## 2018-02-04 MED ORDER — ONDANSETRON HCL 4 MG/2ML IJ SOLN: 4.0000 mg | Freq: Four times a day (QID) | INTRAMUSCULAR | Status: DC | PRN

## 2018-02-04 MED ORDER — SODIUM CHLORIDE 0.9 % IV SOLN
10.0000 mL/h | Freq: Once | INTRAVENOUS | Status: AC
  Administered 2018-02-04: 10 mL/h via INTRAVENOUS

## 2018-02-04 MED ORDER — POTASSIUM CHLORIDE CRYS ER 20 MEQ PO TBCR
40.0000 meq | EXTENDED_RELEASE_TABLET | Freq: Once | ORAL | Status: AC
  Administered 2018-02-04: 40 meq via ORAL
  Filled 2018-02-04: qty 2

## 2018-02-04 MED ORDER — DOCUSATE SODIUM 50 MG PO CAPS
50.0000 mg | ORAL_CAPSULE | Freq: Every day | ORAL | Status: DC
  Filled 2018-02-04: qty 1

## 2018-02-04 MED ORDER — FOLIC ACID 1 MG PO TABS
1.0000 mg | ORAL_TABLET | Freq: Every day | ORAL | Status: DC
  Administered 2018-02-04 – 2018-02-05 (×2): 1 mg via ORAL
  Filled 2018-02-04 (×2): qty 1

## 2018-02-04 MED ORDER — ONDANSETRON HCL 4 MG PO TABS: 4.0000 mg | ORAL_TABLET | Freq: Four times a day (QID) | ORAL | Status: DC | PRN

## 2018-02-04 MED ORDER — ACETAMINOPHEN 325 MG PO TABS: 650.0000 mg | ORAL_TABLET | Freq: Four times a day (QID) | ORAL | Status: DC | PRN

## 2018-02-04 MED ORDER — IOPAMIDOL (ISOVUE-300) INJECTION 61%
100.0000 mL | Freq: Once | INTRAVENOUS | Status: AC | PRN
  Administered 2018-02-04: 100 mL via INTRAVENOUS

## 2018-02-04 MED ORDER — TETRAHYDROZOLINE HCL 0.05 % OP SOLN: 1.0000 [drp] | Freq: Every day | OPHTHALMIC | Status: DC | PRN

## 2018-02-04 MED ORDER — IOHEXOL 300 MG/ML  SOLN
30.0000 mL | Freq: Once | INTRAMUSCULAR | Status: AC | PRN
  Administered 2018-02-04: 30 mL via ORAL

## 2018-02-04 NOTE — ED Notes (Signed)
Transport called.

## 2018-02-04 NOTE — ED Triage Notes (Signed)
Pt brought over from [facility] center for Hgb 4.4. Pt has blood bracelet on in triage room where she had that done in Cancer center.

## 2018-02-04 NOTE — ED Provider Notes (Signed)
[facility] DEPT Provider Note   CSN: [id number] Arrival date & time: 02/04/18  1632     History   Chief Complaint Chief Complaint  Patient presents with  . low Hgb    HPI [name] is a 77 y.o. female with history of idiopathic chronic cold agglutinin disease, vitamin B12 deficiency, bilateral carotid artery stenosis, COPD, diverticulosis presents today for evaluation of low hemoglobin.  She was seen and evaluated at the cancer center earlier today for a routine follow-up and was found to have a hemoglobin of 4.4.  CBC 2 weeks ago showed a hemoglobin of 6.7.  Lab work today also showed hypokalemia of 3.0.  Patient denies any melena or hematochezia.  She does note that for the past 3 days she has had sharp intermittent right lower quadrant abdominal pain which will come on randomly and last for approximately a minute or so before relieving.  Pain does not radiate.  No aggravating or alleviating factors noted.  She denies fevers, nausea, vomiting, diarrhea, or constipation.  She does note urinary frequency but denies urgency or dysuria or hematuria.  She does note progressively worsening fatigue over the past 3 weeks or so.  She denies lightheadedness, shortness of breath, or syncope.  The history is provided by the patient.    Past Medical History:  Diagnosis Date  . B12 deficiency   . Bilateral carotid artery stenosis    right CCA and ICA 1-39%/    left ICA 40-59%  per last duplex  10-19-2015  . COPD (chronic obstructive pulmonary disease) ([place])   . Diverticulosis of colon   . History of diverticulitis of colon    2004  . 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[place])    montiored by dr Marko Plume  . Renal and ureteric calculus    left    Patient Active Problem List   Diagnosis Date Noted  . 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 Occult blood in stools 02/04/2018  . Ureteral calculus 11/12/2015  . Breast cancer  screening, high risk patient 08/29/2015  . Acute delirium 08/07/2015  . Postoperative anemia due to acute blood loss 08/06/2015  . Protein-calorie malnutrition, severe 08/04/2015  . Closed intertrochanteric fracture of left femur ([place]) 08/03/2015  . Fall 08/03/2015  . Closed right hip fracture ([place]) 08/03/2015  . Hyperbilirubinemia 08/01/2013  . Hypokalemia 07/30/2013  . Vitamin B 12 deficiency 08/07/2012  . 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[place]) 06/10/2012  . Tobacco abuse 06/10/2012    Past Surgical History:  Procedure Laterality Date  . ABDOMINAL HYSTERECTOMY  1986  . APPENDECTOMY  1967  . BREAST EXCISIONAL BIOPSY Right 1984  . CATARACT EXTRACTION W/ INTRAOCULAR LENS IMPLANT Right 1990's   congential cataract  . CYSTOSCOPY W/ URETERAL STENT PLACEMENT  11/12/2015   Procedure: CYSTOSCOPY WITH RETROGRADE PYELOGRAM/URETERAL STENT PLACEMENT;  Surgeon: [name], MD;  Location: [facility];  Service: Urology;;  . Consuela Mimes W/ URETERAL STENT REMOVAL Left 11/22/2015   Procedure: CYSTOSCOPY WITH STENT REMOVAL;  Surgeon: [name], MD;  Location: [facility];  Service: Urology;  Laterality: Left;  . CYSTOSCOPY/URETEROSCOPY/HOLMIUM LASER Left 11/22/2015   Procedure: CYSTOSCOPY/URETEROSCOPY/HOLMIUM LASER;  Surgeon: [name], MD;  Location: [facility];  Service: Urology;  Laterality: Left;  FLEX URETEROSCOPY  . FEMUR IM NAIL Left 08/04/2015   Procedure: INTRAMEDULLARY (IM) NAIL FEMORAL;  Surgeon: [name], MD;  Location: [facility];  Service: Orthopedics;  Laterality: Left;  . TONSILLECTOMY  age 35     OB History   None      Home Medications    Prior to Admission medications   Medication Sig Start Date End Date Taking? Authorizing Provider  acetaminophen (TYLENOL) 325 MG tablet Take 2 tablets (650 mg total) by mouth every 6 (six) hours as needed for mild pain (or Fever >/= 101). 08/08/15  Yes Delfina Redwood, MD  Cyanocobalamin ([facility]  B-12) 1000 MCG TBCR TAKE 1 TABLET DAILY 05/22/17  Yes Brunetta Genera, MD  docusate sodium (COLACE) 100 MG capsule Take 1 capsule (100 mg total) by mouth daily. 08/08/15  Yes Delfina Redwood, MD  folic acid (FOLVITE) 1 MG tablet Take 1 tablet (1 mg total) by mouth daily. 05/22/17  Yes Brunetta Genera, MD  potassium chloride SA (K-DUR,KLOR-CON) 20 MEQ tablet Take 1 tablet (20 mEq total) by mouth 2 (two) times daily. F/u with PCP in 10-14 days to determine need for ongoing replacement 05/22/17  Yes Irene Limbo, Cloria Spring, MD  Tetrahydrozoline HCl (VISINE OP) Apply 1 drop to eye daily as needed (allergies/dry eyes).   Yes [provider]    Family History Family History  Problem Relation Age of Onset  . Breast cancer Cousin     Social History Social History   Tobacco Use  . Smoking status: Former Smoker    Packs/day: 1.00    Types: Cigarettes    Last attempt to quit: 08/03/2015    Years since quitting: 2.5  . Smokeless tobacco: Never Used  Substance Use Topics  . Alcohol use: Yes    Comment: occ  . Drug use: Not on file     Allergies   Other and Shellfish allergy   Review of Systems Review of Systems  Constitutional: Positive for chills and fatigue.  Gastrointestinal: Positive for abdominal pain. Negative for blood in stool, constipation, diarrhea, nausea and vomiting.  Genitourinary: Positive for frequency. Negative for dysuria, hematuria and urgency.  Neurological: Negative for syncope and light-headedness.  All other systems reviewed and are negative.    Physical Exam Updated Vital Signs BP (!) 114/52 (BP Location: Right Arm)   Pulse 82   Temp 99.6 F (37.6 C) (Oral)   Resp 20   Ht 5' (1.524 m)   Wt 45.8 kg (100 lb 15.5 oz)   SpO2 93%   BMI 19.72 kg/m   Physical Exam  Constitutional: She appears well-developed and well-nourished. No distress.  Resting comfortably in bed  HENT:  Head: Normocephalic and atraumatic.  Eyes: Conjunctivae are  normal. Right eye exhibits no discharge. Left eye exhibits no discharge.  Pale palpebral conjunctivae  Neck: Normal range of motion. Neck supple. No JVD present. No tracheal deviation present.  Cardiovascular: Normal rate, regular rhythm and normal heart sounds.  Pulmonary/Chest: Effort normal.  Abdominal: Soft. Bowel sounds are normal. She exhibits distension.  Mild abdominal distension noted. Mild pain on palpation of the epigastric and LLQ. Murphy's sign absent, rovsing's absent, no CVA tenderness  Genitourinary:  Genitourinary Comments: Examination performed in the presence of chaperone.  No frank rectal bleeding, small external hemorrhoids which do not appear to be thrombosed or bleeding and are nontender to palpation.  No fissures or tears noted.  Musculoskeletal: She exhibits no edema.  Neurological: She is alert.  Skin: Skin is warm and dry. No erythema. There is pallor.  Lidocaine patch adhered to the low back  Psychiatric: She has a normal mood and affect. Her behavior is normal.  Nursing note and  vitals reviewed.    ED Treatments / Results  Labs (all labs ordered are listed, but only abnormal results are displayed) Labs Reviewed  BASIC METABOLIC PANEL - Abnormal; Notable for the following components:      Result Value   Potassium 3.3 (*)    Glucose, Bld 114 (*)    Calcium 8.8 (*)    All other components within normal limits  CBC WITH DIFFERENTIAL/PLATELET - Abnormal; Notable for the following components:   RBC 1.15 (*)    Hemoglobin 4.3 (*)    HCT 12.0 (*)    MCV 104.3 (*)    MCH 37.4 (*)    RDW 15.6 (*)    Platelets 501 (*)    Lymphs Abs 4.4 (*)    All other components within normal limits  URINALYSIS, ROUTINE W REFLEX MICROSCOPIC - Abnormal; Notable for the following components:   Hgb urine dipstick SMALL (*)    All other components within normal limits  POC OCCULT BLOOD, ED - Abnormal; Notable for the following components:   Fecal Occult Bld POSITIVE (*)     All other components within normal limits  LIPASE, BLOOD  MAGNESIUM  HEMOGLOBIN AND HEMATOCRIT, BLOOD  TYPE AND SCREEN  PREPARE RBC (CROSSMATCH)    EKG None  Radiology Ct Abdomen Pelvis W Contrast  Result Date: 02/04/2018 CLINICAL DATA:  Pt brought over from [facility] center for Hgb 4.4. History of diverticulitis. EXAM: CT ABDOMEN AND PELVIS WITH CONTRAST TECHNIQUE: Multidetector CT imaging of the abdomen and pelvis was performed using the standard protocol following bolus administration of intravenous contrast. CONTRAST:  115mL ISOVUE-300 IOPAMIDOL (ISOVUE-300) INJECTION 61% COMPARISON:  CT abdomen dated 11/12/2015. CT dated 02/28/2009 FINDINGS: Lower chest: No acute abnormality. Hepatobiliary: No focal liver abnormality is seen. Multiple stones within the slightly contracted gallbladder. No evidence of acute cholecystitis. No bile duct dilatation seen. Pancreas: Partially infiltrated with fat but otherwise unremarkable. Spleen: Normal in size without focal abnormality. Adrenals/Urinary Tract: Adrenal glands are unremarkable. Kidneys appear normal without mass, stone or hydronephrosis. No ureteral or bladder calculi identified. Stomach/Bowel: Chronic thickening of the walls of the sigmoid colon. Stable small amount of free fluid and/or peritoneal wall thickening in the lower pelvis is also chronic. Diverticulosis again noted throughout the sigmoid colon. No dilated large or small bowel loops. Stomach is unremarkable, partially decompressed. Vascular/Lymphatic: Aortic atherosclerosis. No enlarged abdominal or pelvic lymph nodes. Reproductive: Status post hysterectomy. No acute appearing adnexal abnormality. Other: No evidence of acute hemorrhage within the abdomen or pelvis. No retroperitoneal hemorrhage. No abscess collection. No free intraperitoneal air. Musculoskeletal: No acute or suspicious osseous finding. Mild degenerative change throughout the slightly scoliotic thoracolumbar spine.  Superficial soft tissues are unremarkable. IMPRESSION: 1. No acute findings within the abdomen or pelvis. No acute hemorrhage. No evidence of acute solid organ abnormality. No renal or ureteral calculi. No bowel obstruction. 2. Colonic diverticulosis without evidence of acute diverticulitis. The thickening of the walls of the sigmoid colon is stable compared to multiple prior studies indicating chronic process, with no change seen to suggest a superimposed acute diverticulitis. 3. Cholelithiasis without evidence of acute cholecystitis. 4. Aortic atherosclerosis. Electronically Signed   By: Franki Cabot M.D.   On: 02/04/2018 20:14    Procedures Procedures (including critical care time)  Medications Ordered in ED Medications  acetaminophen (TYLENOL) tablet 650 mg (has no administration in time range)    Or  acetaminophen (TYLENOL) suppository 650 mg (has no administration in time range)  ondansetron (ZOFRAN) tablet 4  mg (has no administration in time range)    Or  ondansetron (ZOFRAN) injection 4 mg (has no administration in time range)  docusate sodium (COLACE) capsule 50 mg (has no administration in time range)  folic acid (FOLVITE) tablet 1 mg (1 mg Oral Given 02/04/18 2241)  potassium chloride SA (K-DUR,KLOR-CON) CR tablet 20 mEq (has no administration in time range)  tetrahydrozoline 0.05 % ophthalmic solution 1 drop (has no administration in time range)  iohexol (OMNIPAQUE) 300 MG/ML solution 30 mL (30 mLs Oral Contrast Given 02/04/18 1809)  potassium chloride SA (K-DUR,KLOR-CON) CR tablet 40 mEq (40 mEq Oral Given 02/04/18 1854)  0.9 %  sodium chloride infusion (10 mL/hr Intravenous New Bag/Given 02/04/18 1908)  iopamidol (ISOVUE-300) 61 % injection 100 mL (100 mLs Intravenous Contrast Given 02/04/18 1928)     Initial Impression / Assessment and Plan / ED Course  I have reviewed the triage vital signs and the nursing notes.  Pertinent labs & imaging results that were available during my  care of the patient were reviewed by me and considered in my medical decision making (see chart for details).     Patient presents at the request of her oncologist for evaluation of symptom medic anemia.  She is afebrile, vital signs are stable while in the ED.  She has also been complaining of intermittent left lower quadrant abdominal pain for the past 3 days.  Lab work reviewed by me confirms anemia with hemoglobin of 4.3, improved hypokalemia with potassium of 3.3.  We will replenish with p.o. potassium in the ED.  UA is not concerning for UTI or nephrolithiasis.  She does have a positive Hemoccult but she does have external hemorrhoids on examination, no frank rectal bleeding or history of melena or hematochezia.  CT scan of the abdomen and pelvis shows no acute abnormalities.  No evidence of obstruction, perforation, appendicitis, or acute diverticulitis.  2 units of packed red blood cells initiated for transfusion in the ED. 8:51 PM Spoke with Dr. Alcario Drought with [facility] who agrees to assume care of patient and bring her into the hospital for further evaluation and management.  CRITICAL CARE Performed by: Renita Papa   Total critical care time: 40 minutes  Critical care time was exclusive of separately billable procedures and treating other patients.  Critical care was necessary to treat or prevent imminent or life-threatening deterioration.  Critical care was time spent personally by me on the following activities: development of treatment plan with patient and/or surrogate as well as nursing, discussions with consultants, evaluation of patient's response to treatment, examination of patient, obtaining history from patient or surrogate, ordering and performing treatments and interventions, ordering and review of laboratory studies, ordering and review of radiographic studies, pulse oximetry and re-evaluation of patient's condition.   Final Clinical Impressions(s) / ED Diagnoses   Final  diagnoses:  Symptomatic anemia    ED Discharge Orders    None       Renita Papa, PA-C 02/05/18 0102    Little, Wenda Overland, MD 02/06/18 1304

## 2018-02-04 NOTE — ED Notes (Signed)
Date and time results received: 02/04/18 6:53 PM  (use smartphrase ".now" to insert current time)  Test: Hgb 4.3 Critical Value: 4.3  Name of Provider Notified: Normajean Baxter made aware   Orders Received? Or Actions Taken?: Mina Pa aware, states orders for transfusion are being placed

## 2018-02-04 NOTE — H&P (Signed)
History and Physical    [name] UVO:[id number] DOB: August 01, 1941 DOA: 02/04/2018  PCP: [name], MD  Patient coming from: Home  I have personally briefly reviewed patient's old medical records in [place]  Chief Complaint: Anemia  HPI: [name] is a 77 y.o. female with medical history significant of cold agglutinin anemia.  Patient seen earlier today by Dr. Irene Limbo in office for routine follow up.  HGB 4.4, sent in to ED where HGB was confirmed.  This down from 6.7 x2 weeks ago.  Patient denies any melena or hematochezia.  She does note that for the past 3 days she has had sharp intermittent right lower quadrant abdominal pain which will come on randomly and last for approximately a minute or so before relieving.  Pain does not radiate.  No aggravating or alleviating factors noted.  She denies fevers, nausea, vomiting, diarrhea, or constipation.  She does note urinary frequency but denies urgency or dysuria or hematuria.  She does note progressively worsening fatigue over the past 3 weeks or so.  She denies lightheadedness, shortness of breath, or syncope.  ED Course: Hemoccult positive.  HGB 4.3.  2u PRBC ordered for transfusion.   Review of Systems: As per HPI otherwise 10 point review of systems negative.   Past Medical History:  Diagnosis Date  . B12 deficiency   . Bilateral carotid artery stenosis    right CCA and ICA 1-39%/    left ICA 40-59%  per last duplex  10-19-2015  . COPD (chronic obstructive pulmonary disease) ([place])   . Diverticulosis of colon   . History of diverticulitis of colon    2004  . 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 Renal and ureteric calculus    left    Past Surgical History:  Procedure Laterality Date  . ABDOMINAL HYSTERECTOMY  1986  . APPENDECTOMY  1967  . BREAST EXCISIONAL BIOPSY Right 1984  . CATARACT EXTRACTION W/ INTRAOCULAR LENS IMPLANT Right 1990's   congential  cataract  . CYSTOSCOPY W/ URETERAL STENT PLACEMENT  11/12/2015   Procedure: CYSTOSCOPY WITH RETROGRADE PYELOGRAM/URETERAL STENT PLACEMENT;  Surgeon: [name], MD;  Location: [facility];  Service: Urology;;  . Consuela Mimes W/ URETERAL STENT REMOVAL Left 11/22/2015   Procedure: CYSTOSCOPY WITH STENT REMOVAL;  Surgeon: [name], MD;  Location: [facility];  Service: Urology;  Laterality: Left;  . CYSTOSCOPY/URETEROSCOPY/HOLMIUM LASER Left 11/22/2015   Procedure: CYSTOSCOPY/URETEROSCOPY/HOLMIUM LASER;  Surgeon: [name], MD;  Location: [facility];  Service: Urology;  Laterality: Left;  FLEX URETEROSCOPY  . FEMUR IM NAIL Left 08/04/2015   Procedure: INTRAMEDULLARY (IM) NAIL FEMORAL;  Surgeon: [name], MD;  Location: [place];  Service: Orthopedics;  Laterality: Left;  . TONSILLECTOMY  age 79     reports that she quit smoking about 2 years ago. Her smoking use included cigarettes. She smoked 1.00 pack per day. She has never used smokeless tobacco. She reports that she drinks alcohol. Her drug history is not on file.  Allergies  Allergen Reactions  . Other Other (See Comments)    "Most antibiotics give yeast infection"  . Shellfish Allergy Hives    Family History  Problem Relation Age of Onset  . Breast cancer Cousin      Prior to Admission medications   Medication Sig Start Date End Date Taking? Authorizing Provider  acetaminophen (TYLENOL) 325 MG tablet Take 2 tablets (650 mg total) by  mouth every 6 (six) hours as needed for mild pain (or Fever >/= 101). 08/08/15  Yes Delfina Redwood, MD  Cyanocobalamin ([facility] B-12) 1000 MCG TBCR TAKE 1 TABLET DAILY 05/22/17  Yes Brunetta Genera, MD  docusate sodium (COLACE) 100 MG capsule Take 1 capsule (100 mg total) by mouth daily. 08/08/15  Yes Delfina Redwood, MD  folic acid (FOLVITE) 1 MG tablet Take 1 tablet (1 mg total) by mouth daily. 05/22/17  Yes Brunetta Genera, MD  potassium chloride SA  (K-DUR,KLOR-CON) 20 MEQ tablet Take 1 tablet (20 mEq total) by mouth 2 (two) times daily. F/u with PCP in 10-14 days to determine need for ongoing replacement 05/22/17  Yes Irene Limbo, [place]oria [place], MD  Tetrahydrozoline HCl (VISINE OP) Apply 1 drop to eye daily as needed (allergies/dry eyes).   Yes [provider]    Physical Exam: Vitals:   02/04/18 1758 02/04/18 1845 02/04/18 2008 02/04/18 2036  BP: (!) 119/46 (!) 142/47 98/62 (!) 119/44  Pulse: 80 91 86 81  Resp: 14 (!) 25 15 17   Temp:      TempSrc:      SpO2: 93% (!) 87% 95% 95%    Constitutional: NAD, calm, comfortable Eyes: PERRL, lids and conjunctivae normal ENMT: Mucous membranes are moist. Posterior pharynx clear of any exudate or lesions.Normal dentition.  Neck: normal, supple, no masses, no thyromegaly Respiratory: clear to auscultation bilaterally, no wheezing, no crackles. Normal respiratory effort. No accessory muscle use.  Cardiovascular: Regular rate and rhythm, no murmurs / rubs / gallops. No extremity edema. 2+ pedal pulses. No carotid bruits.  Abdomen: no tenderness, no masses palpated. No hepatosplenomegaly. Bowel sounds positive.  Musculoskeletal: no clubbing / cyanosis. No joint deformity upper and lower extremities. Good ROM, no contractures. Normal muscle tone.  Skin: no rashes, lesions, ulcers. No induration Neurologic: CN 2-12 grossly intact. Sensation intact, DTR normal. Strength 5/5 in all 4.  Psychiatric: Normal judgment and insight. Alert and oriented x 3. Normal mood.    Labs on Admission: I have personally reviewed following labs and imaging studies  CBC: Recent Labs  Lab 02/04/18 1404 02/04/18 1807  WBC 11.4* 10.4  NEUTROABS 6.4 5.4  HGB 4.6* 4.3*  HCT 13.0* 12.0*  MCV 110.2* 104.3*  PLT 515* 401*   Basic Metabolic Panel: Recent Labs  Lab 02/04/18 1404 02/04/18 1719  NA 139 141  K 3.0* 3.3*  CL 101 102  CO2 31* 26  GLUCOSE 74 114*  BUN 10 10  CREATININE 0.67 0.61  CALCIUM 8.7  8.8*  MG  --  1.9   GFR: Estimated Creatinine Clearance: 43.1 mL/min (by C-G formula based on SCr of 0.61 mg/dL). Liver Function Tests: Recent Labs  Lab 02/04/18 1404  AST 17  ALT 7  ALKPHOS 110  BILITOT 2.9*  PROT 6.4  ALBUMIN 4.0   Recent Labs  Lab 02/04/18 1719  LIPASE 24   No results for input(s): AMMONIA in the last 168 hours. Coagulation Profile: No results for input(s): INR, PROTIME in the last 168 hours. Cardiac Enzymes: No results for input(s): CKTOTAL, CKMB, CKMBINDEX, TROPONINI in the last 168 hours. BNP (last 3 results) No results for input(s): PROBNP in the last 8760 hours. HbA1C: No results for input(s): HGBA1C in the last 72 hours. CBG: No results for input(s): GLUCAP in the last 168 hours. Lipid Profile: No results for input(s): CHOL, HDL, LDLCALC, TRIG, CHOLHDL, LDLDIRECT in the last 72 hours. Thyroid Function Tests: No results for input(s): TSH, T4TOTAL,  FREET4, T3FREE, THYROIDAB in the last 72 hours. Anemia Panel: Recent Labs    02/04/18 1404  RETICCTPCT 9.9*   Urine analysis:    Component Value Date/Time   COLORURINE YELLOW 02/04/2018 1800   APPEARANCEUR CLEAR 02/04/2018 1800   LABSPEC 1.005 02/04/2018 1800   PHURINE 6.0 02/04/2018 1800   GLUCOSEU NEGATIVE 02/04/2018 1800   HGBUR SMALL (A) 02/04/2018 1800   BILIRUBINUR NEGATIVE 02/04/2018 1800   KETONESUR NEGATIVE 02/04/2018 1800   PROTEINUR NEGATIVE 02/04/2018 1800   NITRITE NEGATIVE 02/04/2018 1800   LEUKOCYTESUR NEGATIVE 02/04/2018 1800    Radiological Exams on Admission: Ct Abdomen Pelvis W Contrast  Result Date: 02/04/2018 CLINICAL DATA:  Pt brought over from [facility] [address] center for Hgb 4.4. History of diverticulitis. EXAM: CT ABDOMEN AND PELVIS WITH CONTRAST TECHNIQUE: Multidetector CT imaging of the abdomen and pelvis was performed using the standard protocol following bolus administration of intravenous contrast. CONTRAST:  153mL ISOVUE-300 IOPAMIDOL (ISOVUE-300) INJECTION 61%  COMPARISON:  CT abdomen dated 11/12/2015. CT dated 02/28/2009 FINDINGS: Lower chest: No acute abnormality. Hepatobiliary: No focal liver abnormality is seen. Multiple stones within the slightly contracted gallbladder. No evidence of acute cholecystitis. No bile duct dilatation seen. Pancreas: Partially infiltrated with fat but otherwise unremarkable. Spleen: Normal in size without focal abnormality. Adrenals/Urinary Tract: Adrenal glands are unremarkable. Kidneys appear normal without mass, stone or hydronephrosis. No ureteral or bladder calculi identified. Stomach/Bowel: Chronic thickening of the walls of the sigmoid colon. Stable small amount of free fluid and/or peritoneal wall thickening in the lower pelvis is also chronic. Diverticulosis again noted throughout the sigmoid colon. No dilated large or small bowel loops. Stomach is unremarkable, partially decompressed. Vascular/Lymphatic: Aortic atherosclerosis. No enlarged abdominal or pelvic lymph nodes. Reproductive: Status post hysterectomy. No acute appearing adnexal abnormality. Other: No evidence of acute hemorrhage within the abdomen or pelvis. No retroperitoneal hemorrhage. No abscess collection. No free intraperitoneal air. Musculoskeletal: No acute or suspicious osseous finding. Mild degenerative change throughout the slightly scoliotic thoracolumbar spine. Superficial soft tissues are unremarkable. IMPRESSION: 1. No acute findings within the abdomen or pelvis. No acute hemorrhage. No evidence of acute solid organ abnormality. No renal or ureteral calculi. No bowel obstruction. 2. Colonic diverticulosis without evidence of acute diverticulitis. The thickening of the walls of the sigmoid colon is stable compared to multiple prior studies indicating chronic process, with no change seen to suggest a superimposed acute diverticulitis. 3. Cholelithiasis without evidence of acute cholecystitis. 4. Aortic atherosclerosis. Electronically Signed   By: Franki Cabot M.D.   On: 02/04/2018 20:14    EKG: Independently reviewed.  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 2u PRBC transfusion 2. Repeat H/H in AM 2. Occult blood in stools - 1. Repeat H/H post transfusion in AM 2. No gross melena, hematochezia, etc. 3. Call GI in AM after repeat H/H: IP evaluation vs outpatient follow up 4. Patient also just about due for colonoscopy it seems with last being in 2010.  DVT prophylaxis: SCDs Code Status: Full Family Communication: Family at bedside Disposition Plan: Home after admit Consults called: None Admission status: Place in [place], [place] [facility] Pager [phone]  If 7AM-7PM, please contact day team taking care of patient [email or web address] Password [facility]  02/04/2018, 9:25 PM

## 2018-02-04 NOTE — ED Notes (Signed)
Patient transported to CT 

## 2018-02-04 NOTE — ED Notes (Signed)
ED TO INPATIENT HANDOFF REPORT  Name/Age/Gender [name] 77 y.o. female  Code Status    Code Status Orders  (From admission, onward)        Start     Ordered   02/04/18 2050  Full code  Continuous     02/04/18 2052    Code Status History    Date Active Date Inactive Code Status Order ID Comments User Context   08/04/2015 1842 08/08/2015 2219 Full Code [id number]  Caryn Section Inpatient   08/03/2015 2006 08/04/2015 1842 Full Code [id number]  [name], MD ED      Home/SNF/Other Home  Chief Complaint low hemoglobin  Level of Care/Admitting Diagnosis ED Disposition    ED Disposition Condition [facility] [facility] Area: [facility] [[id number]]  Level of Care: Med-Surg [16]  Diagnosis: 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 B12 deficiency   . Bilateral carotid artery stenosis    right CCA and ICA 1-39%/    left ICA 40-59%  per last duplex  10-19-2015  . COPD (chronic obstructive pulmonary disease) ([place])   . Diverticulosis of colon   . History of diverticulitis of colon    2004  . 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 Renal and ureteric calculus    left    Allergies Allergies  Allergen Reactions  . Other Other (See Comments)    "Most antibiotics give yeast infection"  . Shellfish Allergy Hives    IV Location/Drains/Wounds Patient Lines/Drains/Airways Status   Active Line/Drains/Airways    Name:   Placement date:   Placement time:   Site:   Days:   Peripheral IV 02/04/18 Left Antecubital   02/04/18    1730    Antecubital   less than 1   Incision (Closed) 08/04/15 Hip Left   08/04/15    1636     915    Incision (Closed) 11/22/15 Perineum   11/22/15    1004     805          Labs/Imaging Results for orders placed or performed during the hospital encounter of 02/04/18 (from the past 48 hour(s))  Type and screen [place]     Status: None (Preliminary result)   Collection Time: 02/04/18  2:10 PM  Result Value Ref Range   ABO/RH(D) PENDING    Antibody Screen NEG    Sample Expiration      02/07/2018 Performed at [facility], [place] [address] [place], [place] [address]   Basic metabolic panel     Status: Abnormal   Collection Time: 02/04/18  5:19 PM  Result Value Ref Range   Sodium 141 135 - 145 mmol/L   Potassium 3.3 (L) 3.5 - 5.1 mmol/L   Chloride 102 101 - 111 mmol/L   CO2 26 22 - 32 mmol/L   Glucose, Bld 114 (H) 65 - 99 mg/dL   BUN 10 6 - 20 mg/dL   Creatinine, Ser 0.61 0.44 - 1.00 mg/dL   Calcium 8.8 (L) 8.9 - 10.3 mg/dL   GFR calc non Af Amer >60 >60 mL/min   GFR calc Af Amer >  60 >60 mL/min    Comment: (NOTE) The eGFR has been calculated using the CKD EPI equation. This calculation has not been validated in all clinical situations. eGFR's persistently <60 mL/min signify possible Chronic Kidney Disease.    Anion gap 13 5 - 15    Comment: Performed at [facility], [place] [address], [place], [place] [address]  Lipase, blood     Status: None   Collection Time: 02/04/18  5:19 PM  Result Value Ref Range   Lipase 24 11 - 51 U/L    Comment: Performed at [facility], [place] [address], [place], [place] [address]  Magnesium     Status: None   Collection Time: 02/04/18  5:19 PM  Result Value Ref Range   Magnesium 1.9 1.7 - 2.4 mg/dL    Comment: Performed at [facility], [place] [address] [place], [place] [address]  POC occult blood, ED Provider will collect     Status: Abnormal   Collection Time: 02/04/18  5:38 PM  Result Value Ref Range   Fecal Occult Bld POSITIVE (A) NEGATIVE   Urinalysis, Routine w reflex microscopic     Status: Abnormal   Collection Time: 02/04/18  6:00 PM  Result Value Ref Range   Color, Urine YELLOW YELLOW   APPearance CLEAR CLEAR   Specific Gravity, Urine 1.005 1.005 - 1.030   pH 6.0 5.0 - 8.0   Glucose, UA NEGATIVE NEGATIVE mg/dL   Hgb urine dipstick SMALL (A) NEGATIVE   Bilirubin Urine NEGATIVE NEGATIVE   Ketones, ur NEGATIVE NEGATIVE mg/dL   Protein, ur NEGATIVE NEGATIVE mg/dL   Nitrite NEGATIVE NEGATIVE   Leukocytes, UA NEGATIVE NEGATIVE   RBC / HPF 0-5 0 - 5 RBC/hpf   WBC, UA 0-5 0 - 5 WBC/hpf   Bacteria, UA NONE SEEN NONE SEEN    Comment: Performed at [facility], [place] [address], [place], [place] [address]  CBC with Differential     Status: Abnormal   Collection Time: 02/04/18  6:07 PM  Result Value Ref Range   WBC 10.4 4.0 - 10.5 K/uL   RBC 1.15 (L) 3.87 - 5.11 MIL/uL   Hemoglobin 4.3 (LL) 12.0 - 15.0 g/dL    Comment: REPEATED TO VERIFY CRITICAL RESULT CALLED TO, READ BACK BY AND VERIFIED WITH: HALL,C. RN '@[id number]'  ON 05.15.19 BY COHEN,K    HCT 12.0 (L) 36.0 - 46.0 %   MCV 104.3 (H) 78.0 - 100.0 fL   MCH 37.4 (H) 26.0 - 34.0 pg   MCHC 35.8 30.0 - 36.0 g/dL   RDW 15.6 (H) 11.5 - 15.5 %   Platelets 501 (H) 150 - 400 K/uL   Neutrophils Relative % 52 %   Lymphocytes Relative 42 %   Monocytes Relative 5 %   Eosinophils Relative 1 %   Basophils Relative 0 %   Neutro Abs 5.4 1.7 - 7.7 K/uL   Lymphs Abs 4.4 (H) 0.7 - 4.0 K/uL   Monocytes Absolute 0.5 0.1 - 1.0 K/uL   Eosinophils Absolute 0.1 0.0 - 0.7 K/uL   Basophils Absolute 0.0 0.0 - 0.1 K/uL   RBC Morphology AGGLUTINATION     Comment: Performed at [facility], [place] [address], [place], [place] [address]  Prepare RBC     Status: None   Collection Time: 02/04/18  7:00 PM  Result Value Ref Range   Order Confirmation      ORDER PROCESSED BY BLOOD BANK Performed at [facility]  [place] [address] [place], [place]  [address]    Ct Abdomen Pelvis W Contrast  Result Date: 02/04/2018 CLINICAL DATA:  Pt brought over from [facility] center for Hgb 4.4. History of diverticulitis. EXAM: CT ABDOMEN AND PELVIS WITH CONTRAST TECHNIQUE: Multidetector CT imaging of the abdomen and pelvis was performed using the standard protocol following bolus administration of intravenous contrast. CONTRAST:  18m ISOVUE-300 IOPAMIDOL (ISOVUE-300) INJECTION 61% COMPARISON:  CT abdomen dated 11/12/2015. CT dated 02/28/2009 FINDINGS: Lower chest: No acute abnormality. Hepatobiliary: No focal liver abnormality is seen. Multiple stones within the slightly contracted gallbladder. No evidence of acute cholecystitis. No bile duct dilatation seen. Pancreas: Partially infiltrated with fat but otherwise unremarkable. Spleen: Normal in size without focal abnormality. Adrenals/Urinary Tract: Adrenal glands are unremarkable. Kidneys appear normal without mass, stone or hydronephrosis. No ureteral or bladder calculi identified. Stomach/Bowel: Chronic thickening of the walls of the sigmoid colon. Stable small amount of free fluid and/or peritoneal wall thickening in the lower pelvis is also chronic. Diverticulosis again noted throughout the sigmoid colon. No dilated large or small bowel loops. Stomach is unremarkable, partially decompressed. Vascular/Lymphatic: Aortic atherosclerosis. No enlarged abdominal or pelvic lymph nodes. Reproductive: Status post hysterectomy. No acute appearing adnexal abnormality. Other: No evidence of acute hemorrhage within the abdomen or pelvis. No retroperitoneal hemorrhage. No abscess collection. No free intraperitoneal air. Musculoskeletal: No acute or suspicious osseous finding. Mild degenerative change throughout the slightly scoliotic thoracolumbar spine. Superficial soft tissues are unremarkable. IMPRESSION: 1. No acute findings within the abdomen or pelvis. No acute hemorrhage. No evidence of acute solid organ abnormality. No  renal or ureteral calculi. No bowel obstruction. 2. Colonic diverticulosis without evidence of acute diverticulitis. The thickening of the walls of the sigmoid colon is stable compared to multiple prior studies indicating chronic process, with no change seen to suggest a superimposed acute diverticulitis. 3. Cholelithiasis without evidence of acute cholecystitis. 4. Aortic atherosclerosis. Electronically Signed   By: SFranki CabotM.D.   On: 02/04/2018 20:14    Pending Labs Unresulted Labs (From admission, onward)   Start     Ordered   02/05/18 0500  Hemoglobin and hematocrit, blood  Tomorrow morning,   R     02/04/18 2047      Vitals/Pain Today's Vitals   02/04/18 1845 02/04/18 1930 02/04/18 2008 02/04/18 2036  BP: (!) 142/47  98/62 (!) 119/44  Pulse: 91  86 81  Resp: (!) '25  15 17  ' Temp:      TempSrc:      SpO2: (!) 87%  95% 95%  PainSc:  0-No pain      Isolation Precautions No active isolations  Medications Medications  acetaminophen (TYLENOL) tablet 650 mg (has no administration in time range)    Or  acetaminophen (TYLENOL) suppository 650 mg (has no administration in time range)  ondansetron (ZOFRAN) tablet 4 mg (has no administration in time range)    Or  ondansetron (ZOFRAN) injection 4 mg (has no administration in time range)  docusate sodium (COLACE) capsule 50 mg (has no administration in time range)  folic acid (FOLVITE) tablet 1 mg (has no administration in time range)  potassium chloride SA (K-DUR,KLOR-CON) CR tablet 20 mEq (has no administration in time range)  tetrahydrozoline 0.05 % ophthalmic solution 1 drop (has no administration in time range)  iohexol (OMNIPAQUE) 300 MG/ML solution 30 mL (30 mLs Oral Contrast Given 02/04/18 1809)  potassium chloride SA (K-DUR,KLOR-CON) CR tablet 40 mEq (40 mEq Oral Given  02/04/18 1854)  0.9 %  sodium chloride infusion (10 mL/hr Intravenous New Bag/Given 02/04/18 1908)  iopamidol (ISOVUE-300) 61 % injection 100 mL (100 mLs  Intravenous Contrast Given 02/04/18 1928)    Mobility walks with device

## 2018-02-05 ENCOUNTER — Telehealth: Payer: Self-pay

## 2018-02-05 DIAGNOSIS — D591 Other autoimmune hemolytic anemias: Secondary | ICD-10-CM | POA: Diagnosis not present

## 2018-02-05 DIAGNOSIS — R195 Other fecal abnormalities: Secondary | ICD-10-CM | POA: Diagnosis not present

## 2018-02-05 DIAGNOSIS — D649 Anemia, unspecified: Secondary | ICD-10-CM

## 2018-02-05 LAB — IRON AND TIBC
IRON: 198 ug/dL — AB (ref 28–170)
SATURATION RATIOS: 85 % — AB (ref 10.4–31.8)
TIBC: 232 ug/dL — AB (ref 250–450)
UIBC: 34 ug/dL

## 2018-02-05 LAB — FERRITIN: Ferritin: 511 ng/mL — ABNORMAL HIGH (ref 11–307)

## 2018-02-05 LAB — HEMOGLOBIN AND HEMATOCRIT, BLOOD
HEMATOCRIT: 26.4 % — AB (ref 36.0–46.0)
HEMOGLOBIN: 8.7 g/dL — AB (ref 12.0–15.0)

## 2018-02-05 MED ORDER — MAGNESIUM SULFATE 2 GM/50ML IV SOLN
2.0000 g | Freq: Once | INTRAVENOUS | Status: AC
  Administered 2018-02-05: 2 g via INTRAVENOUS
  Filled 2018-02-05: qty 50

## 2018-02-05 MED ORDER — POTASSIUM CHLORIDE CRYS ER 20 MEQ PO TBCR
20.0000 meq | EXTENDED_RELEASE_TABLET | Freq: Two times a day (BID) | ORAL | Status: DC
  Administered 2018-02-05: 20 meq via ORAL
  Filled 2018-02-05 (×2): qty 1

## 2018-02-05 NOTE — Telephone Encounter (Signed)
Spoke with patient sister and she will inform her of the upcoming appointment/ currently the patient is in the hospital. No calender was needed do to [place]. Per 5/15 los

## 2018-02-05 NOTE — Discharge Summary (Signed)
Discharge Summary  [name] EVO:[id number] DOB: 09-12-1941  PCP: [name], MD  Admit date: 02/04/2018 Discharge date: 02/05/2018  Time spent: 25 MINUTES  Recommendations for Outpatient Follow-up:  1. Follow up with your hematologist 2. Follow up with GI 3. Take your medications as prescribed  Discharge Diagnoses:  Active Hospital Problems   Diagnosis Date Noted  . 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 Occult blood in stools 02/04/2018  . 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    Discharge Condition: Stable  Diet recommendation: Resume previous diet  Vitals:   02/05/18 0446 02/05/18 1535  BP: (!) 118/44 (!) 130/55  Pulse: 70 76  Resp: 17 18  Temp: 99.4 F (37.4 C) 98 F (36.7 C)  SpO2: 94% 96%    History of present illness:  [name] is a 77 y.o. female with medical history significant of cold agglutinin anemia.  Patient seen earlier today by Dr. Irene Limbo in office for routine follow up.  HGB 4.4, sent in to ED where HGB was confirmed.  This down from 6.7 x2 weeks ago.  Patient denies any melena or hematochezia. She does note that for the past 3 days she has had sharp intermittent right lower quadrant abdominal pain which will come on randomly and last for approximately a minute or so before relieving. Pain does not radiate. No aggravating or alleviating factors noted. She denies fevers, nausea, vomiting, diarrhea, or constipation. She does note urinary frequency but denies urgency or dysuria or hematuria. She does note progressively worsening fatigue over the past 3 weeks or so. She denies lightheadedness, shortness of breath, or syncope.  ED Course: Hemoccult positive.  HGB 4.3.  2u PRBC ordered for transfusion.  02/05/18: Hg 8.3 post transfusion. GI called and Dr Therisa Doyne will arrange to see the patient outpatient. Patient denies  any melena or hematochezia. She also denies dizziness, chest pain or palpitations. BP stable with MAP 78.  On the day of discharge, the patient was hemodynamically stable.  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  Procedures:  None  Consultations:  None  Discharge Exam: BP (!) 130/55 (BP Location: Left Arm)   Pulse 76   Temp 98 F (36.7 C) (Oral)   Resp 18   Ht 5' (1.524 m)   Wt 45.8 kg (100 lb 15.5 oz)   SpO2 96%   BMI 19.72 kg/m  . General: 77 y.o. year-old female well developed well nourished in no acute distress.  Alert and oriented x3. . Cardiovascular: Regular rate and rhythm with no rubs or gallops.  No thyromegaly or JVD noted.   Marland Kitchen Respiratory: Clear to auscultation with no wheezes or rales. Good inspiratory effort. . Abdomen: Soft nontender nondistended with normal bowel sounds x4 quadrants. . Musculoskeletal: No lower extremity edema. 2/4 pulses in all 4 extremities. . Skin: No ulcerative lesions noted or rashes, . Psychiatry: Mood is appropriate for condition and setting  Discharge Instructions You were cared for by a hospitalist during your hospital stay. If you have any questions about your discharge medications or the care you received while  you were in the hospital after you are discharged, you can call the unit and asked to speak with the hospitalist on call if the hospitalist that took care of you is not available. Once you are discharged, your primary care physician will handle any further medical issues. Please note that NO REFILLS for any discharge medications will be authorized once you are discharged, as it is  imperative that you return to your primary care physician (or establish a relationship with a primary care physician if you do not have one) for your aftercare needs so that they can reassess your need for medications and monitor your lab values.   Allergies as of 02/05/2018      Reactions   Other Other (See Comments)   "Most antibiotics give yeast infection"   Shellfish Allergy Hives      Medication List    TAKE these medications   acetaminophen 325 MG tablet Commonly known as:  TYLENOL Take 2 tablets (650 mg total) by mouth every 6 (six) hours as needed for mild pain (or Fever >/= 101).   Cyanocobalamin 1000 MCG Tbcr Commonly known as:  [employer] B-12 TAKE 1 TABLET DAILY   docusate sodium 100 MG capsule Commonly known as:  COLACE Take 1 capsule (100 mg total) by mouth daily.   folic acid 1 MG tablet Commonly known as:  FOLVITE Take 1 tablet (1 mg total) by mouth daily.   potassium chloride SA 20 MEQ tablet Commonly known as:  K-DUR,KLOR-CON Take 1 tablet (20 mEq total) by mouth 2 (two) times daily. F/u with PCP in 10-14 days to determine need for ongoing replacement   VISINE OP Apply 1 drop to eye daily as needed (allergies/dry eyes).      Allergies  Allergen Reactions  . Other Other (See Comments)    "Most antibiotics give yeast infection"  . Shellfish Allergy Hives   Follow-up Information    [name], MD Follow up in 2 day(s).   Specialty:  Internal Medicine Why:  call to make appointment. Contact information: [place], [place] [address] [phone]        Brunetta Genera, MD Follow up.   Specialties:  Hematology, Oncology Why:  call to make appointment. Contact information: [place] [address] [phone]            The results of significant diagnostics from this hospitalization (including imaging, microbiology, ancillary and laboratory) are listed below for reference.    Significant  Diagnostic Studies: Dg Lumbar Spine 2-3 Views  Result Date: 01/20/2018 CLINICAL DATA:  77 y/o F; 2 weeks of back pain. History of left hip fracture in 2017. EXAM: SACRUM AND COCCYX - 2+ VIEW; LUMBAR SPINE - 2-3 VIEW COMPARISON:  11/12/2015 CT abdomen and pelvis. FINDINGS: Sacrum and coccyx: There is no evidence of fracture or other focal bone lesions. Lumbar spine: Mild lumbar levocurvature with apex at T12. Normal lumbar lordosis without listhesis. Stable mild loss of height of T11 vertebral body. Otherwise vertebral body heights are preserved. Mild multilevel loss of intervertebral disc space height and small marginal osteophytes. Lower lumbar predominant facet arthrosis. Abdominal aortic atherosclerosis. IMPRESSION: 1. No acute fracture or dislocation identified. 2. Stable chronic mild loss of height of T11 vertebral body. 3. Mild levocurvature of thoracolumbar junction. Mild spondylosis of visible lower thoracic and the lumbar spine. Electronically Signed   By: Kristine Garbe M.D.   On: 01/20/2018 16:45   Dg Sacrum/coccyx  Result Date: 01/20/2018 CLINICAL DATA:  77 y/o F; 2 weeks of back pain. History of left hip fracture in 2017. EXAM: SACRUM AND COCCYX - 2+ VIEW; LUMBAR SPINE - 2-3 VIEW COMPARISON:  11/12/2015 CT abdomen and pelvis. FINDINGS: Sacrum and coccyx: There is no evidence of fracture or other focal bone lesions. Lumbar spine: Mild lumbar levocurvature with apex at T12. Normal lumbar lordosis without listhesis. Stable mild loss of height of T11 vertebral body. Otherwise vertebral body heights are preserved. Mild multilevel loss of intervertebral disc space height and small marginal osteophytes. Lower lumbar predominant facet arthrosis. Abdominal aortic atherosclerosis. IMPRESSION: 1. No acute fracture or dislocation identified. 2. Stable chronic mild loss of height of T11 vertebral body. 3. Mild levocurvature of thoracolumbar junction. Mild spondylosis of visible lower thoracic and  the lumbar spine. Electronically Signed   By: Kristine Garbe M.D.   On: 01/20/2018 16:45   Mr Lumbar Spine Wo Contrast  Result Date: 01/30/2018 CLINICAL DATA:  Low back pain over the last 3 weeks. EXAM: MRI LUMBAR SPINE WITHOUT CONTRAST TECHNIQUE: Multiplanar, multisequence MR imaging of the lumbar spine was performed. No intravenous contrast was administered. COMPARISON:  Radiography 01/20/2018 FINDINGS: Segmentation:  5 lumbar type vertebral bodies. Alignment:  Curvature convex to the left. Vertebrae: Acute/subacute superior endplate fracture at L4 with loss of height of about 10-20%. Minimal posterior bowing of the posterosuperior margin of the vertebral body but no significant encroachment upon the canal or neural structures. This was not visible on the radiography because of the scoliotic curvature and unlevel endplates. In retrospect, this was probably present at that time. There is an old healed superior endplate fracture at G29 with loss of height of 20%. In general, the marrow pattern is somewhat hypercellular, presumed normal variation. Conus medullaris and cauda equina: Conus extends to the L1 level. Conus and cauda equina appear normal. Paraspinal and other soft tissues: Negative Disc levels: No significant disc pathology. Minimal, non-compressive disc bulges at L2-3, L3-4 and L4-5. IMPRESSION: Acute/subacute superior endplate fracture at L4 with loss of height of 10-20%. No retropulsed bone. This is quite likely the cause of the low back pain. This was probably present on the radiographs of 01/20/2018, but not diagnosable because of the spinal curvature and unlevel endplates. Old healed superior endplate fracture at B28. Electronically Signed   By: Nelson Chimes M.D.   On: 01/30/2018 09:45   Ct Abdomen Pelvis W Contrast  Result Date: 02/04/2018 CLINICAL DATA:  Pt brought over from [facility] center for Hgb 4.4. History of diverticulitis. EXAM: CT ABDOMEN AND PELVIS WITH CONTRAST  TECHNIQUE: Multidetector CT imaging of the abdomen and pelvis was performed using the standard protocol following bolus administration of intravenous contrast. CONTRAST:  180mL ISOVUE-300 IOPAMIDOL (ISOVUE-300) INJECTION 61% COMPARISON:  CT abdomen dated 11/12/2015. CT dated 02/28/2009 FINDINGS: Lower chest: No acute abnormality. Hepatobiliary: No focal liver abnormality is seen. Multiple stones within the slightly contracted gallbladder. No evidence of acute cholecystitis. No bile duct dilatation seen. Pancreas: Partially infiltrated with fat but otherwise unremarkable. Spleen: Normal in size without focal abnormality. Adrenals/Urinary Tract: Adrenal glands are unremarkable. Kidneys appear normal without mass, stone or hydronephrosis. No ureteral or bladder calculi identified. Stomach/Bowel: Chronic thickening of the walls of the sigmoid colon. Stable small amount of free fluid and/or peritoneal wall thickening in the lower pelvis is also chronic. Diverticulosis again noted throughout the sigmoid colon. No dilated large or small bowel loops. Stomach is unremarkable, partially decompressed. Vascular/Lymphatic: Aortic atherosclerosis. No enlarged abdominal or pelvic lymph nodes. Reproductive: Status post  hysterectomy. No acute appearing adnexal abnormality. Other: No evidence of acute hemorrhage within the abdomen or pelvis. No retroperitoneal hemorrhage. No abscess collection. No free intraperitoneal air. Musculoskeletal: No acute or suspicious osseous finding. Mild degenerative change throughout the slightly scoliotic thoracolumbar spine. Superficial soft tissues are unremarkable. IMPRESSION: 1. No acute findings within the abdomen or pelvis. No acute hemorrhage. No evidence of acute solid organ abnormality. No renal or ureteral calculi. No bowel obstruction. 2. Colonic diverticulosis without evidence of acute diverticulitis. The thickening of the walls of the sigmoid colon is stable compared to multiple prior  studies indicating chronic process, with no change seen to suggest a superimposed acute diverticulitis. 3. Cholelithiasis without evidence of acute cholecystitis. 4. Aortic atherosclerosis. Electronically Signed   By: Franki Cabot M.D.   On: 02/04/2018 20:14    Microbiology: No results found for this or any previous visit (from the past 240 hour(s)).   Labs: Basic Metabolic Panel: Recent Labs  Lab 02/04/18 1404 02/04/18 1719  NA 139 141  K 3.0* 3.3*  CL 101 102  CO2 31* 26  GLUCOSE 74 114*  BUN 10 10  CREATININE 0.67 0.61  CALCIUM 8.7 8.8*  MG  --  1.9   Liver Function Tests: Recent Labs  Lab 02/04/18 1404  AST 17  ALT 7  ALKPHOS 110  BILITOT 2.9*  PROT 6.4  ALBUMIN 4.0   Recent Labs  Lab 02/04/18 1719  LIPASE 24   No results for input(s): AMMONIA in the last 168 hours. CBC: Recent Labs  Lab 02/04/18 1404 02/04/18 1807 02/05/18 0941  WBC 11.4* 10.4  --   NEUTROABS 6.4 5.4  --   HGB 4.6* 4.3* 8.7*  HCT 13.0* 12.0* 26.4*  MCV 110.2* 104.3*  --   PLT 515* 501*  --    Cardiac Enzymes: No results for input(s): CKTOTAL, CKMB, CKMBINDEX, TROPONINI in the last 168 hours. BNP: BNP (last 3 results) No results for input(s): BNP in the last 8760 hours.  ProBNP (last 3 results) No results for input(s): PROBNP in the last 8760 hours.  CBG: No results for input(s): GLUCAP in the last 168 hours.     Signed:  Kayleen Memos, MD [facility] 02/05/2018, 4:45 PM

## 2018-02-05 NOTE — Discharge Instructions (Signed)
Blood Transfusion A blood transfusion is a procedure in which you are given blood through an IV tube. You may need this procedure because of:  Illness.  Surgery.  Injury.  The blood may come from someone else (a donor). You may also be able to donate blood for yourself (autologous blood donation). The blood given in a transfusion is made up of different types of cells. You may get:  Red blood cells. These carry oxygen to the cells in the body.  White blood cells. These help you fight infections.  Platelets. These help your blood to clot.  Plasma. This is the liquid part of your blood. It helps with fluid imbalances.  If you have a clotting disorder, you may also get other types of blood products. What happens before the procedure?  You will have a blood test to find out your blood type. The test also finds out what type of blood your body will accept and matches it to the donor type.  If you are going to have a planned surgery, you may be able to donate your own blood. This may be done in case you need a transfusion.  If you have had an allergic reaction to a transfusion in the past, you may be given medicine to help prevent a reaction. This medicine may be given to you by mouth or through an IV.  You will have your temperature, blood pressure, and pulse checked.  Follow instructions from your doctor about what you cannot eat or drink.  Ask your doctor about: ? Changing or stopping your regular medicines. This is important if you take diabetes medicines or blood thinners. ? Taking medicines such as aspirin and ibuprofen. These medicines can thin your blood. Do not take these medicines before your procedure if your doctor tells you not to. What happens during the procedure?  An IV tube will be put into one of your veins.  The bag of donated blood will be attached to your IV tube. Then, the blood will enter through your vein.  Your temperature, blood pressure, and pulse will be  checked regularly during the procedure. This is done to find early signs of a transfusion reaction.  If you have any signs or symptoms of a reaction, your transfusion will be stopped. You may also be given medicine.  When the transfusion is done, your IV tube will be taken out.  Pressure may be applied to the IV site for a few minutes.  A bandage (dressing) will be put on the IV site. The procedure may vary among doctors and hospitals. What happens after the procedure?  Your temperature, blood pressure, heart rate, breathing rate, and blood oxygen level will be checked often.  Your blood may be tested to see how you are responding to the transfusion.  You may be warmed with fluids or blankets. This is done to keep the temperature of your body normal. Summary  A blood transfusion is a procedure in which you are given blood through an IV tube.  The blood may come from someone else (a donor). You may also be able to donate blood for yourself.  If you have had an allergic reaction to a transfusion in the past, you may be given medicine to help prevent a reaction. This medicine may be given to you by mouth or through an IV tube.  Your temperature, blood pressure, heart rate, breathing rate, and blood oxygen level will be checked often.  Your blood may be tested to   see how you are responding to the transfusion. This information is not intended to replace advice given to you by your health care provider. Make sure you discuss any questions you have with your health care provider. Document Released: 12/06/2008 Document Revised: 05/03/2016 Document Reviewed: 05/03/2016 Elsevier Interactive Patient Education  2017 [employer]  

## 2018-02-06 LAB — BPAM RBC
Blood Product Expiration Date: 201906022359
Blood Product Expiration Date: 201906022359
ISSUE DATE / TIME: 201905160022
ISSUE DATE / TIME: 201905160341
Unit Type and Rh: 9500
Unit Type and Rh: 9500

## 2018-02-06 LAB — TYPE AND SCREEN
ABO/RH(D): B POS
Antibody Screen: NEGATIVE
DAT, IgG: NEGATIVE
Unit division: 0
Unit division: 0

## 2018-02-18 ENCOUNTER — Inpatient Hospital Stay: Payer: Medicare Other

## 2018-02-18 ENCOUNTER — Other Ambulatory Visit: Payer: Self-pay

## 2018-02-18 ENCOUNTER — Telehealth: Payer: Self-pay

## 2018-02-18 ENCOUNTER — Encounter: Payer: Self-pay | Admitting: Hematology

## 2018-02-18 ENCOUNTER — Inpatient Hospital Stay (HOSPITAL_BASED_OUTPATIENT_CLINIC_OR_DEPARTMENT_OTHER): Payer: Medicare Other | Admitting: Hematology

## 2018-02-18 VITALS — BP 132/55 | HR 65 | Temp 98.7°F | Resp 16 | Ht 60.0 in | Wt 98.3 lb

## 2018-02-18 DIAGNOSIS — S32040A Wedge compression fracture of fourth lumbar vertebra, initial encounter for closed fracture: Secondary | ICD-10-CM

## 2018-02-18 DIAGNOSIS — E538 Deficiency of other specified B group vitamins: Secondary | ICD-10-CM | POA: Diagnosis not present

## 2018-02-18 DIAGNOSIS — M5126 Other intervertebral disc displacement, lumbar region: Secondary | ICD-10-CM | POA: Diagnosis not present

## 2018-02-18 DIAGNOSIS — K5730 Diverticulosis of large intestine without perforation or abscess without bleeding: Secondary | ICD-10-CM

## 2018-02-18 DIAGNOSIS — M4056 Lordosis, unspecified, lumbar region: Secondary | ICD-10-CM

## 2018-02-18 DIAGNOSIS — E876 Hypokalemia: Secondary | ICD-10-CM

## 2018-02-18 DIAGNOSIS — D591 Autoimmune hemolytic anemia, unspecified: Secondary | ICD-10-CM

## 2018-02-18 DIAGNOSIS — J449 Chronic obstructive pulmonary disease, unspecified: Secondary | ICD-10-CM

## 2018-02-18 DIAGNOSIS — Z87891 Personal history of nicotine dependence: Secondary | ICD-10-CM

## 2018-02-18 DIAGNOSIS — I6523 Occlusion and stenosis of bilateral carotid arteries: Secondary | ICD-10-CM

## 2018-02-18 DIAGNOSIS — Z8781 Personal history of (healed) traumatic fracture: Secondary | ICD-10-CM

## 2018-02-18 DIAGNOSIS — R531 Weakness: Secondary | ICD-10-CM

## 2018-02-18 DIAGNOSIS — D5912 Cold autoimmune hemolytic anemia: Secondary | ICD-10-CM

## 2018-02-18 DIAGNOSIS — Z87442 Personal history of urinary calculi: Secondary | ICD-10-CM

## 2018-02-18 DIAGNOSIS — K8020 Calculus of gallbladder without cholecystitis without obstruction: Secondary | ICD-10-CM

## 2018-02-18 DIAGNOSIS — Z79899 Other long term (current) drug therapy: Secondary | ICD-10-CM

## 2018-02-18 DIAGNOSIS — D649 Anemia, unspecified: Secondary | ICD-10-CM

## 2018-02-18 DIAGNOSIS — M549 Dorsalgia, unspecified: Secondary | ICD-10-CM | POA: Diagnosis not present

## 2018-02-18 DIAGNOSIS — Z8719 Personal history of other diseases of the digestive system: Secondary | ICD-10-CM

## 2018-02-18 LAB — CMP (CANCER CENTER ONLY)
ALBUMIN: 4.2 g/dL (ref 3.5–5.0)
ALT: 11 U/L (ref 0–55)
AST: 20 U/L (ref 5–34)
Alkaline Phosphatase: 102 U/L (ref 40–150)
Anion gap: 13 — ABNORMAL HIGH (ref 3–11)
BUN: 10 mg/dL (ref 7–26)
CHLORIDE: 102 mmol/L (ref 98–109)
CO2: 26 mmol/L (ref 22–29)
Calcium: 9.1 mg/dL (ref 8.4–10.4)
Creatinine: 0.7 mg/dL (ref 0.60–1.10)
GFR, Est AFR Am: >60 mL/min (ref >60)
Glucose, Bld: 116 mg/dL (ref 70–140)
POTASSIUM: 3.5 mmol/L (ref 3.5–5.1)
SODIUM: 141 mmol/L (ref 136–145)
Total Bilirubin: 2.8 mg/dL — ABNORMAL HIGH (ref 0.2–1.2)
Total Protein: 6.7 g/dL (ref 6.4–8.3)

## 2018-02-18 LAB — CBC WITH DIFFERENTIAL (CANCER CENTER ONLY)
BASOS ABS: 0.1 10*3/uL (ref 0.0–0.1)
Basophils Relative: 1 %
Eosinophils Absolute: 0 10*3/uL (ref 0.0–0.5)
Eosinophils Relative: 0 %
HCT: 18.2 % — ABNORMAL LOW (ref 34.8–46.6)
HEMOGLOBIN: 6 g/dL — AB (ref 11.6–15.9)
LYMPHS PCT: 32 %
Lymphs Abs: 3.7 10*3/uL — ABNORMAL HIGH (ref 0.9–3.3)
MCH: 33.5 pg (ref 25.1–34.0)
MCHC: 33 g/dL (ref 31.5–36.0)
MCV: 101.7 fL — ABNORMAL HIGH (ref 79.5–101.0)
MONO ABS: 0.6 10*3/uL (ref 0.1–0.9)
Monocytes Relative: 5 %
NEUTROS ABS: 7.1 10*3/uL — AB (ref 1.5–6.5)
NRBC: 1 /100{WBCs} — AB
Neutrophils Relative %: 62 %
Platelet Count: 610 10*3/uL — ABNORMAL HIGH (ref 145–400)
RBC: 1.79 MIL/uL — AB (ref 3.70–5.45)
RDW: 17.7 % — ABNORMAL HIGH (ref 11.2–14.5)
WBC Count: 11.4 10*3/uL — ABNORMAL HIGH (ref 3.9–10.3)

## 2018-02-18 LAB — RETICULOCYTES
RBC.: 1.79 MIL/uL — AB (ref 3.70–5.45)
RETIC COUNT ABSOLUTE: 98.5 10*3/uL — AB (ref 33.7–90.7)
RETIC CT PCT: 5.5 % — AB (ref 0.7–2.1)

## 2018-02-18 LAB — LACTATE DEHYDROGENASE: LDH: 435 U/L — AB (ref 125–245)

## 2018-02-18 MED ORDER — TRAMADOL HCL 50 MG PO TABS: 50.0000 mg | ORAL_TABLET | Freq: Three times a day (TID) | ORAL | 0 refills | Status: DC | PRN

## 2018-02-18 NOTE — Progress Notes (Signed)
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 I last saw [name] as an inpatient on 02/05/18. She is accompanied today by her sister. The pt reports that she is doing well overall.   The pt reports persisting back pain and notes that though she has not yet called her orthopedist, Dr Percell Miller, she plans to today. She has been taking aspirin and tylenol for her back pain and notes that her pain has not been fully addressed and has kept her from sleep at night.   She will also be seeing her GI, Dr Watt Climes, tomorrow. She denies checking her stools very closely for blood but denies noticing any blood in the stools.   She notes that after receiving her last transfusion her light headedness began to resolve.   Of note since the patient's last visit, pt has had CT A/P completed on 02/04/18 with results revealing 1. No acute findings within the abdomen or pelvis. No acute hemorrhage. No evidence of acute solid organ abnormality. No renal or ureteral calculi. No bowel obstruction. 2. Colonic diverticulosis without evidence of acute diverticulitis. The thickening of the walls of the sigmoid colon is stable compared to multiple prior studies indicating chronic process, with no change seen to suggest a superimposed acute diverticulitis. 3. Cholelithiasis without evidence of acute cholecystitis. 4. Aortic atherosclerosis.  Lab results today (02/19/18) of CBC, CMP, and Reticulocytes is as follows: all values are WNL except for ; WBC at 11.4k, RBC at 1.79, Hgb at 6.0, HCT at 18.2, MCV at 101.7, RDW at 17.7, Plateletes at 610k, ANC at 7.1k, Lymphs Abs at 3.7k,  Total Bilirubin at 2.8, Retic ct pct at 5.5%, Retic ct abs at 98.5k. LDH 02/18/18 is elevated at 435.   On review of systems, pt reports back pain, and denies light headedness, dizziness, chest pain, leg swelling, and any other symptoms.    REVIEW OF SYSTEMS:    A 10+ POINT REVIEW OF SYSTEMS WAS OBTAINED including neurology, dermatology, psychiatry, cardiac, respiratory, lymph, extremities, GI, GU, Musculoskeletal, constitutional, breasts, reproductive, HEENT.  All pertinent positives are noted in the HPI.  All others are negative.   Past Medical History:  Diagnosis Date  . B12 deficiency   . Bilateral carotid artery stenosis    right CCA and ICA 1-39%/    left ICA 40-59%  per last duplex  10-19-2015  . COPD (chronic obstructive pulmonary disease) ([place])   . Diverticulosis of colon   . History of diverticulitis of colon    2004  . 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 Renal and ureteric calculus    left    . Past Surgical History:  Procedure Laterality Date  . ABDOMINAL HYSTERECTOMY  1986  . APPENDECTOMY  1967  . BREAST EXCISIONAL BIOPSY Right 1984  . CATARACT EXTRACTION W/ INTRAOCULAR LENS IMPLANT Right 1990's   congential cataract  . CYSTOSCOPY W/ URETERAL STENT PLACEMENT  11/12/2015   Procedure: CYSTOSCOPY WITH RETROGRADE PYELOGRAM/URETERAL STENT PLACEMENT;  Surgeon: [name], MD;  Location: [facility];  Service: Urology;;  . Consuela Mimes W/ URETERAL STENT REMOVAL Left 11/22/2015   Procedure: CYSTOSCOPY WITH  STENT REMOVAL;  Surgeon: [name], MD;  Location: [facility];  Service: Urology;  Laterality: Left;  . CYSTOSCOPY/URETEROSCOPY/HOLMIUM LASER Left 11/22/2015   Procedure: CYSTOSCOPY/URETEROSCOPY/HOLMIUM LASER;  Surgeon: [name], MD;  Location: [facility];  Service: Urology;  Laterality: Left;  FLEX URETEROSCOPY  . FEMUR IM NAIL Left 08/04/2015   Procedure: INTRAMEDULLARY (IM) NAIL FEMORAL;  Surgeon:  [name], MD;  Location: [place];  Service: Orthopedics;  Laterality: Left;  . TONSILLECTOMY  age 20    . Social History   Tobacco Use  . Smoking status: Former Smoker    Packs/day: 1.00    Types: Cigarettes    Last attempt to quit: 08/03/2015    Years since quitting: 2.5  . Smokeless tobacco: Never Used  Substance Use Topics  . Alcohol use: Yes    Comment: occ  . Drug use: Not on file    ALLERGIES:  is allergic to other and shellfish allergy.  MEDICATIONS:  Current Outpatient Medications  Medication Sig Dispense Refill  . acetaminophen (TYLENOL) 325 MG tablet Take 2 tablets (650 mg total) by mouth every 6 (six) hours as needed for mild pain (or Fever >/= 101).    . Cyanocobalamin ([employer] B-12) 1000 MCG TBCR TAKE 1 TABLET DAILY 30 tablet 11  . docusate sodium (COLACE) 100 MG capsule Take 1 capsule (100 mg total) by mouth daily.    . folic acid (FOLVITE) 1 MG tablet Take 1 tablet (1 mg total) by mouth daily. 90 tablet 11  . potassium chloride SA (K-DUR,KLOR-CON) 20 MEQ tablet Take 1 tablet (20 mEq total) by mouth 2 (two) times daily. F/u with PCP in 10-14 days to determine need for ongoing replacement 20 tablet 0  . Tetrahydrozoline HCl (VISINE OP) Apply 1 drop to eye daily as needed (allergies/dry eyes).     No current facility-administered medications for this visit.     PHYSICAL EXAMINATION: ECOG PERFORMANCE STATUS: 1 - Symptomatic but completely ambulatory  Vitals:   02/18/18 1014  BP: (!) 132/55  Pulse: 65  Resp: 16  Temp: 98.7 F (37.1 C)  SpO2: 100%    Filed Weights   02/18/18 1014  Weight: 98 lb 4.8 oz (44.6 kg)   .Body mass index is 19.2 kg/m.  GENERAL:alert, in no acute distress and comfortable SKIN: no acute rashes, no significant lesions EYES: conjunctiva are pink and non-injected, sclera anicteric OROPHARYNX: MMM, no exudates, no oropharyngeal erythema or ulceration NECK: supple, no JVD LYMPH:  no palpable lymphadenopathy in the cervical,  axillary or inguinal regions LUNGS: clear to auscultation b/l with normal respiratory effort HEART: regular rate & rhythm ABDOMEN:  normoactive bowel sounds , non tender, not distended. Extremity: no pedal edema PSYCH: alert & oriented x 3 with fluent speech NEURO: no focal motor/sensory deficits   LABORATORY DATA:   I have reviewed the data as listed  . CBC Latest Ref Rng & Units 02/18/2018 02/05/2018 02/04/2018  WBC 3.9 - 10.3 K/uL 11.4(H) - 10.4  Hemoglobin 11.6 - 15.9 g/dL 6.0(LL) 8.7(L) 4.3(LL)  Hematocrit 34.8 - 46.6 % 18.2(L) 26.4(L) 12.0(L)  Platelets 145 - 400 K/uL 610(H) - 501(H)    CMP Latest Ref Rng & Units 02/18/2018 02/04/2018 02/04/2018  Glucose 70 - 140 mg/dL 116 114(H) 74  BUN 7 - 26 mg/dL '10 10 10  ' Creatinine 0.60 - 1.10 mg/dL 0.70 0.61 0.67  Sodium 136 - 145 mmol/L 141 141 139  Potassium 3.5 - 5.1 mmol/L 3.5 3.3(L) 3.0(LL)  Chloride  98 - 109 mmol/L 102 102 101  CO2 22 - 29 mmol/L 26 26 31(H)  Calcium 8.4 - 10.4 mg/dL 9.1 8.8(L) 8.7  Total Protein 6.4 - 8.3 g/dL 6.7 - 6.4  Total Bilirubin 0.2 - 1.2 mg/dL 2.8(H) - 2.9(H)  Alkaline Phos 40 - 150 U/L 102 - 110  AST 5 - 34 U/L 20 - 17  ALT 0 - 55 U/L 11 - 7   . Lab Results  Component Value Date   LDH 435 (H) 02/18/2018          RADIOGRAPHIC STUDIES: I have personally reviewed the radiological images as listed and agreed with the findings in the report. Dg Lumbar Spine 2-3 Views  Result Date: 01/20/2018 CLINICAL DATA:  77 y/o F; 2 weeks of back pain. History of left hip fracture in 2017. EXAM: SACRUM AND COCCYX - 2+ VIEW; LUMBAR SPINE - 2-3 VIEW COMPARISON:  11/12/2015 CT abdomen and pelvis. FINDINGS: Sacrum and coccyx: There is no evidence of fracture or other focal bone lesions. Lumbar spine: Mild lumbar levocurvature with apex at T12. Normal lumbar lordosis without listhesis. Stable mild loss of height of T11 vertebral body. Otherwise vertebral body heights are preserved. Mild multilevel loss of  intervertebral disc space height and small marginal osteophytes. Lower lumbar predominant facet arthrosis. Abdominal aortic atherosclerosis. IMPRESSION: 1. No acute fracture or dislocation identified. 2. Stable chronic mild loss of height of T11 vertebral body. 3. Mild levocurvature of thoracolumbar junction. Mild spondylosis of visible lower thoracic and the lumbar spine. Electronically Signed   By: Kristine Garbe M.D.   On: 01/20/2018 16:45   Dg Sacrum/coccyx  Result Date: 01/20/2018 CLINICAL DATA:  77 y/o F; 2 weeks of back pain. History of left hip fracture in 2017. EXAM: SACRUM AND COCCYX - 2+ VIEW; LUMBAR SPINE - 2-3 VIEW COMPARISON:  11/12/2015 CT abdomen and pelvis. FINDINGS: Sacrum and coccyx: There is no evidence of fracture or other focal bone lesions. Lumbar spine: Mild lumbar levocurvature with apex at T12. Normal lumbar lordosis without listhesis. Stable mild loss of height of T11 vertebral body. Otherwise vertebral body heights are preserved. Mild multilevel loss of intervertebral disc space height and small marginal osteophytes. Lower lumbar predominant facet arthrosis. Abdominal aortic atherosclerosis. IMPRESSION: 1. No acute fracture or dislocation identified. 2. Stable chronic mild loss of height of T11 vertebral body. 3. Mild levocurvature of thoracolumbar junction. Mild spondylosis of visible lower thoracic and the lumbar spine. Electronically Signed   By: Kristine Garbe M.D.   On: 01/20/2018 16:45   Mr Lumbar Spine Wo Contrast  Result Date: 01/30/2018 CLINICAL DATA:  Low back pain over the last 3 weeks. EXAM: MRI LUMBAR SPINE WITHOUT CONTRAST TECHNIQUE: Multiplanar, multisequence MR imaging of the lumbar spine was performed. No intravenous contrast was administered. COMPARISON:  Radiography 01/20/2018 FINDINGS: Segmentation:  5 lumbar type vertebral bodies. Alignment:  Curvature convex to the left. Vertebrae: Acute/subacute superior endplate fracture at L4 with loss  of height of about 10-20%. Minimal posterior bowing of the posterosuperior margin of the vertebral body but no significant encroachment upon the canal or neural structures. This was not visible on the radiography because of the scoliotic curvature and unlevel endplates. In retrospect, this was probably present at that time. There is an old healed superior endplate fracture at J49 with loss of height of 20%. In general, the marrow pattern is somewhat hypercellular, presumed normal variation. Conus medullaris and cauda equina: Conus extends to the L1 level. Conus and cauda equina appear  normal. Paraspinal and other soft tissues: Negative Disc levels: No significant disc pathology. Minimal, non-compressive disc bulges at L2-3, L3-4 and L4-5. IMPRESSION: Acute/subacute superior endplate fracture at L4 with loss of height of 10-20%. No retropulsed bone. This is quite likely the cause of the low back pain. This was probably present on the radiographs of 01/20/2018, but not diagnosable because of the spinal curvature and unlevel endplates. Old healed superior endplate fracture at K27. Electronically Signed   By: Nelson Chimes M.D.   On: 01/30/2018 09:45   Ct Abdomen Pelvis W Contrast  Result Date: 02/04/2018 CLINICAL DATA:  Pt brought over from [facility] center for Hgb 4.4. History of diverticulitis. EXAM: CT ABDOMEN AND PELVIS WITH CONTRAST TECHNIQUE: Multidetector CT imaging of the abdomen and pelvis was performed using the standard protocol following bolus administration of intravenous contrast. CONTRAST:  144m ISOVUE-300 IOPAMIDOL (ISOVUE-300) INJECTION 61% COMPARISON:  CT abdomen dated 11/12/2015. CT dated 02/28/2009 FINDINGS: Lower chest: No acute abnormality. Hepatobiliary: No focal liver abnormality is seen. Multiple stones within the slightly contracted gallbladder. No evidence of acute cholecystitis. No bile duct dilatation seen. Pancreas: Partially infiltrated with fat but otherwise unremarkable. Spleen:  Normal in size without focal abnormality. Adrenals/Urinary Tract: Adrenal glands are unremarkable. Kidneys appear normal without mass, stone or hydronephrosis. No ureteral or bladder calculi identified. Stomach/Bowel: Chronic thickening of the walls of the sigmoid colon. Stable small amount of free fluid and/or peritoneal wall thickening in the lower pelvis is also chronic. Diverticulosis again noted throughout the sigmoid colon. No dilated large or small bowel loops. Stomach is unremarkable, partially decompressed. Vascular/Lymphatic: Aortic atherosclerosis. No enlarged abdominal or pelvic lymph nodes. Reproductive: Status post hysterectomy. No acute appearing adnexal abnormality. Other: No evidence of acute hemorrhage within the abdomen or pelvis. No retroperitoneal hemorrhage. No abscess collection. No free intraperitoneal air. Musculoskeletal: No acute or suspicious osseous finding. Mild degenerative change throughout the slightly scoliotic thoracolumbar spine. Superficial soft tissues are unremarkable. IMPRESSION: 1. No acute findings within the abdomen or pelvis. No acute hemorrhage. No evidence of acute solid organ abnormality. No renal or ureteral calculi. No bowel obstruction. 2. Colonic diverticulosis without evidence of acute diverticulitis. The thickening of the walls of the sigmoid colon is stable compared to multiple prior studies indicating chronic process, with no change seen to suggest a superimposed acute diverticulitis. 3. Cholelithiasis without evidence of acute cholecystitis. 4. Aortic atherosclerosis. Electronically Signed   By: SFranki CabotM.D.   On: 02/04/2018 20:14    ASSESSMENT & PLAN:   #1: Cold Agglutinin related chronic hemolytic anemia. Her hemoglobin tends to be lower during winter and has been in the 6-8 range. Overall stable hemoglobin levels in the 6-7 range with folic acid replacement and cold avoidance.  #2 IgM lambda MGUS - cannot rule out possibility of  lymphoplasmacytic lymphoma though less likely given chronicity of her condition.  #3 history of previous B12 deficiency  Plan  -Discussed with pt that the frequency of her transfusions is  strongly suggestive for beginning treatment of her cold agglutinin hemolytic anemia with Rituxan.  -Pt needs a transfusion to get Hgb at least above 6 -Pt will need to see her orthopedist Dr. MPercell Millersoon concerning her L4 fx  -ALL TRANSFUSION PRODUCTS SHOULD BE PRE-WARMED USING A BLOOD WARMER INCLUDING IV FLUIDS, IF ANY. -Continue cold avoidance aggressively. -Continue folic acid and BC62replacement as being done currently. -I discussed there other triggers of hemolysis than change in temperature.  -I discussed there is a possibility treating  her with Rituxan without bone marrow biopsy. Pt opted for this method.  -Discussed pt labwork today, 02/19/18; Hgb is at 6.0 today, LDH is elevated at 435.  -Blood transfusion 2 units in the next 2-3 days -If colonoscopy is decided upon as outpatient with Dr Watt Climes tomorrow, we will set up an outpatient blood transfusion prior to endoscopy.  -Rituxan once a week x 4 doses -Discontinue aspirin and other NSAIDs   #4 hypokalemia -Advised continuing her 26m potassium pill; potassium normalized  #5. Low back pain  -New onset with tenderness upon palpation on physical exam  -I recommended a xray to rule out compression fracture from osteoporosis today - X ray showed 1. No acute fracture or dislocation identified. 2. Stable chronic mild loss of height of T11 vertebral body. 3. Mild levocurvature of thoracolumbar junction. Mild spondylosis of visible lower thoracic and the lumbar spine.  -MRI L spine -Acute/subacute superior endplate fracture at L4 with loss of height of 10-20%. No retropulsed bone. This is quite likely the cause of the low back pain. This was probably present on the radiographs of 01/20/2018, but not diagnosable because of the spinal curvature and unlevel  endplates. Old healed superior endplate fracture at TT77-she was also recommended to get in touch with her ortho physician Dr MPercell Millerfor further evaluation of her back pain. -Follow up with orthopedist regarding vertebral fracture -Will prescribe Tramadol for back pain   #6 Patient Active Problem List   Diagnosis Date Noted  . 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 ([place] 02/04/2018  . Occult blood in stools 02/04/2018  . Ureteral calculus 11/12/2015  . Breast cancer screening, high risk patient 08/29/2015  . Acute delirium 08/07/2015  . Postoperative anemia due to acute blood loss 08/06/2015  . Protein-calorie malnutrition, severe 08/04/2015  . [place]teric [place] of left [place] ([place] 08/03/2015  . Fall 08/03/2015  . Closed right hip fracture ([place] 08/03/2015  . Hyperbilirubinemia 08/01/2013  . Hypokalemia 07/30/2013  . Vitamin B 12 deficiency 08/07/2012  . 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[place] 06/10/2012  . Tobacco abuse 06/10/2012   -Continue follow-up with primary care physician for management of other medical comorbidities  -ALL TRANSFUSION PRODUCTS SHOULD BE PRE-WARMED USING A BLOOD WARMER INCLUDING IV FLUIDS, IF ANY.    PRBC transfusion 2 units in 2-3 days Chemo-counseling for Rituxan in 5-7 days (morning of 1st infusion if possible) Rituxan weekly x 4 doses to start in 5-7days RTC with Dr KIrene Limbowith 2nd dose of Rituxan for toxicity check Labs with each dose of Rituxan   The toal time spent in the appt was 30 minutes and more than 50% was on counseling and direct patient cares.  GSullivan LoneMD MMayoAAHIVMS SAdventist Medical Center HanfordCWilliamsburg Regional HospitalHematology/Oncology Physician CGeorg[facility] (Office):       [phone](Work cell):  [phone](Fax):           [phone] I, SBaldwin Jamaica am acting as a scribe for Dr KIrene Limbo   .I have reviewed the above documentation for accuracy and completeness, and I agree with the above. KBrunetta Genera MD

## 2018-02-18 NOTE — Telephone Encounter (Signed)
Pt Hgb today 6.0. Will need 2U PRBCs per Dr. Irene Limbo. Appt created for type and screen to be drawn on Friday and 2U administered on Saturday in infusion per Threasa Beards, AD. Pt in agreement with plan. Added instruction for lab and nursing staff to keep lab tubes and blood warmed because pt has cold agglutinin.

## 2018-02-20 ENCOUNTER — Inpatient Hospital Stay: Payer: Medicare Other

## 2018-02-20 ENCOUNTER — Other Ambulatory Visit: Payer: Self-pay

## 2018-02-20 ENCOUNTER — Telehealth: Payer: Self-pay

## 2018-02-20 DIAGNOSIS — D591 Other autoimmune hemolytic anemias: Secondary | ICD-10-CM | POA: Diagnosis not present

## 2018-02-20 DIAGNOSIS — D649 Anemia, unspecified: Secondary | ICD-10-CM

## 2018-02-20 DIAGNOSIS — D5912 Cold autoimmune hemolytic anemia: Secondary | ICD-10-CM

## 2018-02-20 NOTE — Telephone Encounter (Signed)
Called blood bank to inform of 2U to be infused tomorrow. Pt has cold agglutinin. Samples and blood must be warmed.

## 2018-02-21 ENCOUNTER — Inpatient Hospital Stay: Payer: Medicare Other | Attending: Hematology

## 2018-02-21 DIAGNOSIS — E43 Unspecified severe protein-calorie malnutrition: Secondary | ICD-10-CM | POA: Insufficient documentation

## 2018-02-21 DIAGNOSIS — Z5111 Encounter for antineoplastic chemotherapy: Secondary | ICD-10-CM | POA: Diagnosis not present

## 2018-02-21 DIAGNOSIS — Z79899 Other long term (current) drug therapy: Secondary | ICD-10-CM | POA: Diagnosis not present

## 2018-02-21 DIAGNOSIS — M545 Low back pain: Secondary | ICD-10-CM | POA: Diagnosis not present

## 2018-02-21 DIAGNOSIS — Z87442 Personal history of urinary calculi: Secondary | ICD-10-CM | POA: Diagnosis not present

## 2018-02-21 DIAGNOSIS — I700 Atherosclerosis of aorta: Secondary | ICD-10-CM | POA: Insufficient documentation

## 2018-02-21 DIAGNOSIS — E538 Deficiency of other specified B group vitamins: Secondary | ICD-10-CM | POA: Diagnosis not present

## 2018-02-21 DIAGNOSIS — J449 Chronic obstructive pulmonary disease, unspecified: Secondary | ICD-10-CM | POA: Insufficient documentation

## 2018-02-21 DIAGNOSIS — D591 Other autoimmune hemolytic anemias: Secondary | ICD-10-CM | POA: Insufficient documentation

## 2018-02-21 DIAGNOSIS — E876 Hypokalemia: Secondary | ICD-10-CM | POA: Insufficient documentation

## 2018-02-21 DIAGNOSIS — I350 Nonrheumatic aortic (valve) stenosis: Secondary | ICD-10-CM | POA: Diagnosis not present

## 2018-02-21 DIAGNOSIS — D649 Anemia, unspecified: Secondary | ICD-10-CM

## 2018-02-21 DIAGNOSIS — Z87891 Personal history of nicotine dependence: Secondary | ICD-10-CM | POA: Insufficient documentation

## 2018-02-21 DIAGNOSIS — D472 Monoclonal gammopathy: Secondary | ICD-10-CM | POA: Diagnosis not present

## 2018-02-21 DIAGNOSIS — D5912 Cold autoimmune hemolytic anemia: Secondary | ICD-10-CM

## 2018-02-21 LAB — PREPARE RBC (CROSSMATCH)

## 2018-02-21 MED ORDER — SODIUM CHLORIDE 0.9 % IV SOLN
250.0000 mL | Freq: Once | INTRAVENOUS | Status: AC
  Administered 2018-02-21: 250 mL via INTRAVENOUS

## 2018-02-21 MED ORDER — DIPHENHYDRAMINE HCL 25 MG PO CAPS
ORAL_CAPSULE | ORAL | Status: AC
  Filled 2018-02-21: qty 1

## 2018-02-21 MED ORDER — ACETAMINOPHEN 325 MG PO TABS
ORAL_TABLET | ORAL | Status: AC
  Filled 2018-02-21: qty 2

## 2018-02-21 MED ORDER — DIPHENHYDRAMINE HCL 25 MG PO CAPS
25.0000 mg | ORAL_CAPSULE | Freq: Once | ORAL | Status: AC
  Administered 2018-02-21: 25 mg via ORAL

## 2018-02-21 MED ORDER — ACETAMINOPHEN 325 MG PO TABS
650.0000 mg | ORAL_TABLET | Freq: Once | ORAL | Status: AC
  Administered 2018-02-21: 650 mg via ORAL

## 2018-02-21 NOTE — Patient Instructions (Signed)

## 2018-02-23 LAB — BPAM RBC
BLOOD PRODUCT EXPIRATION DATE: 201906252359
Blood Product Expiration Date: 201906252359
ISSUE DATE / TIME: 201906010922
ISSUE DATE / TIME: 201906010922
UNIT TYPE AND RH: 9500
Unit Type and Rh: 9500

## 2018-02-23 LAB — TYPE AND SCREEN
ABO/RH(D): B POS
Antibody Screen: POSITIVE
Unit division: 0
Unit division: 0

## 2018-02-25 ENCOUNTER — Other Ambulatory Visit: Payer: Self-pay

## 2018-02-25 DIAGNOSIS — D591 Autoimmune hemolytic anemia, unspecified: Secondary | ICD-10-CM | POA: Insufficient documentation

## 2018-02-25 NOTE — Progress Notes (Signed)
START OFF PATHWAY REGIMEN - Other Dx   [id number]:Rituximab (Weekly):   Administer weekly:     Rituximab   **Always confirm dose/schedule in your pharmacy ordering system**  Patient Characteristics: Intent of Therapy: Non-Curative / Palliative Intent, Discussed with Patient 

## 2018-02-25 NOTE — Progress Notes (Signed)
Patient on plan of care prior to pathways. 

## 2018-02-25 NOTE — Progress Notes (Signed)
Hep b  

## 2018-02-27 ENCOUNTER — Inpatient Hospital Stay: Payer: Medicare Other

## 2018-02-27 VITALS — BP 128/55 | HR 75 | Temp 98.5°F | Resp 16

## 2018-02-27 DIAGNOSIS — D591 Autoimmune hemolytic anemia, unspecified: Secondary | ICD-10-CM

## 2018-02-27 DIAGNOSIS — D5912 Cold autoimmune hemolytic anemia: Secondary | ICD-10-CM

## 2018-02-27 LAB — CMP (CANCER CENTER ONLY)
ALBUMIN: 4.3 g/dL (ref 3.5–5.0)
ALT: 9 U/L (ref 0–55)
ANION GAP: 11 (ref 3–11)
AST: 19 U/L (ref 5–34)
Alkaline Phosphatase: 124 U/L (ref 40–150)
BILIRUBIN TOTAL: 2.9 mg/dL — AB (ref 0.2–1.2)
BUN: 14 mg/dL (ref 7–26)
CHLORIDE: 101 mmol/L (ref 98–109)
CO2: 28 mmol/L (ref 22–29)
Calcium: 9.1 mg/dL (ref 8.4–10.4)
Creatinine: 0.66 mg/dL (ref 0.60–1.10)
GFR, Est AFR Am: >60 mL/min (ref >60)
GLUCOSE: 83 mg/dL (ref 70–140)
POTASSIUM: 4 mmol/L (ref 3.5–5.1)
Sodium: 140 mmol/L (ref 136–145)
TOTAL PROTEIN: 7 g/dL (ref 6.4–8.3)

## 2018-02-27 LAB — CBC
HEMATOCRIT: 24.5 % — AB (ref 34.8–46.6)
HEMOGLOBIN: 8.5 g/dL — AB (ref 11.6–15.9)
MCH: 35 pg — ABNORMAL HIGH (ref 25.1–34.0)
MCHC: 34.9 g/dL (ref 31.5–36.0)
MCV: 100.5 fL (ref 79.5–101.0)
Platelets: 462 10*3/uL — ABNORMAL HIGH (ref 145–400)
RBC: 2.44 MIL/uL — ABNORMAL LOW (ref 3.70–5.45)
RDW: 18.6 % — ABNORMAL HIGH (ref 11.2–14.5)
WBC: 10.5 10*3/uL — ABNORMAL HIGH (ref 3.9–10.3)

## 2018-02-27 LAB — LACTATE DEHYDROGENASE: LDH: 379 U/L — AB (ref 125–245)

## 2018-02-27 MED ORDER — ACETAMINOPHEN 325 MG PO TABS
650.0000 mg | ORAL_TABLET | Freq: Once | ORAL | Status: AC
  Administered 2018-02-27: 650 mg via ORAL

## 2018-02-27 MED ORDER — METHYLPREDNISOLONE SODIUM SUCC 125 MG IJ SOLR
INTRAMUSCULAR | Status: AC
  Filled 2018-02-27: qty 2

## 2018-02-27 MED ORDER — SODIUM CHLORIDE 0.9 % IV SOLN
375.0000 mg/m2 | Freq: Once | INTRAVENOUS | Status: AC
  Administered 2018-02-27: 500 mg via INTRAVENOUS
  Filled 2018-02-27: qty 50

## 2018-02-27 MED ORDER — SODIUM CHLORIDE 0.9 % IV SOLN
Freq: Once | INTRAVENOUS | Status: AC
  Administered 2018-02-27: 11:00:00 via INTRAVENOUS

## 2018-02-27 MED ORDER — DIPHENHYDRAMINE HCL 25 MG PO CAPS
50.0000 mg | ORAL_CAPSULE | Freq: Once | ORAL | Status: AC
  Administered 2018-02-27: 50 mg via ORAL

## 2018-02-27 MED ORDER — ACETAMINOPHEN 325 MG PO TABS
ORAL_TABLET | ORAL | Status: AC
  Filled 2018-02-27: qty 2

## 2018-02-27 MED ORDER — METHYLPREDNISOLONE SODIUM SUCC 125 MG IJ SOLR
80.0000 mg | Freq: Once | INTRAMUSCULAR | Status: AC
  Administered 2018-02-27: 80 mg via INTRAVENOUS

## 2018-02-27 MED ORDER — FAMOTIDINE 20 MG PO TABS
40.0000 mg | ORAL_TABLET | Freq: Once | ORAL | Status: AC
  Administered 2018-02-27: 40 mg via ORAL

## 2018-02-27 MED ORDER — DIPHENHYDRAMINE HCL 25 MG PO CAPS
ORAL_CAPSULE | ORAL | Status: AC
  Filled 2018-02-27: qty 2

## 2018-02-27 MED ORDER — FAMOTIDINE 20 MG PO TABS
ORAL_TABLET | ORAL | Status: AC
  Filled 2018-02-27: qty 2

## 2018-02-27 NOTE — Progress Notes (Signed)
Per Dr. Irene Limbo, ok to treat today.

## 2018-02-27 NOTE — Patient Instructions (Signed)
[facility] Discharge Instructions for Patients Receiving Chemotherapy  Today you received the following chemotherapy agents Rituxan  To help prevent nausea and vomiting after your treatment, we encourage you to take your nausea medication as directed   If you develop nausea and vomiting that is not controlled by your nausea medication, call the clinic.   BELOW ARE SYMPTOMS THAT SHOULD BE REPORTED IMMEDIATELY:  *FEVER GREATER THAN 100.5 F  *CHILLS WITH OR WITHOUT FEVER  NAUSEA AND VOMITING THAT IS NOT CONTROLLED WITH YOUR NAUSEA MEDICATION  *UNUSUAL SHORTNESS OF BREATH  *UNUSUAL BRUISING OR BLEEDING  TENDERNESS IN MOUTH AND THROAT WITH OR WITHOUT PRESENCE OF ULCERS  *URINARY PROBLEMS  *BOWEL PROBLEMS  UNUSUAL RASH Items with * indicate a potential emergency and should be followed up as soon as possible.  Feel free to call the clinic should you have any questions or concerns. The clinic phone number is [phone].  Please show the CHEMO ALERT CARD at check-in to the Emergency Department and triage nurse.   Rituximab (Rituxan) injection What is this medicine? RITUXIMAB (ri TUX i mab) is a monoclonal antibody. It is used to treat certain types of cancer like non-Hodgkin lymphoma and chronic lymphocytic leukemia. It is also used to treat rheumatoid arthritis, granulomatosis with polyangiitis (or Wegener's granulomatosis), and microscopic polyangiitis. This medicine may be used for other purposes; ask your health care provider or pharmacist if you have questions. COMMON BRAND NAME(S): Rituxan What should I tell my health care provider before I take this medicine? They need to know if you have any of these conditions: -heart disease -infection (especially a virus infection such as hepatitis B, chickenpox, cold sores, or herpes) -immune system problems -irregular heartbeat -kidney disease -lung or breathing disease, like asthma -recently received or  scheduled to receive a vaccine -an unusual or allergic reaction to rituximab, mouse proteins, other medicines, foods, dyes, or preservatives -pregnant or trying to get pregnant -breast-feeding How should I use this medicine? This medicine is for infusion into a vein. It is administered in a hospital or clinic by a specially trained health care professional. A special MedGuide will be given to you by the pharmacist with each prescription and refill. Be sure to read this information carefully each time. Talk to your pediatrician regarding the use of this medicine in children. This medicine is not approved for use in children. Overdosage: If you think you have taken too much of this medicine contact a poison control center or emergency room at once. NOTE: This medicine is only for you. Do not share this medicine with others. What if I miss a dose? It is important not to miss a dose. Call your doctor or health care professional if you are unable to keep an appointment. What may interact with this medicine? -cisplatin -other medicines for arthritis like disease modifying antirheumatic drugs or tumor necrosis factor inhibitors -live virus vaccines This list may not describe all possible interactions. Give your health care provider a list of all the medicines, herbs, non-prescription drugs, or dietary supplements you use. Also tell them if you smoke, drink alcohol, or use illegal drugs. Some items may interact with your medicine. What should I watch for while using this medicine? Your condition will be monitored carefully while you are receiving this medicine. You may need blood work done while you are taking this medicine. This medicine can cause serious allergic reactions. To reduce your risk you may need to take medicine before treatment with this medicine. Take   your medicine as directed. In some patients, this medicine may cause a serious brain infection that may cause death. If you have any  problems seeing, thinking, speaking, walking, or standing, tell your doctor right away. If you cannot reach your doctor, urgently seek other source of medical care. Call your doctor or health care professional for advice if you get a fever, chills or sore throat, or other symptoms of a cold or flu. Do not treat yourself. This drug decreases your body's ability to fight infections. Try to avoid being around people who are sick. Do not become pregnant while taking this medicine or for 12 months after stopping it. Women should inform their doctor if they wish to become pregnant or think they might be pregnant. There is a potential for serious side effects to an unborn child. Talk to your health care professional or pharmacist for more information. What side effects may I notice from receiving this medicine? Side effects that you should report to your doctor or health care professional as soon as possible: -breathing problems -chest pain -dizziness or feeling faint -fast, irregular heartbeat -low blood counts - this medicine may decrease the number of white blood cells, red blood cells and platelets. You may be at increased risk for infections and bleeding. -mouth sores -redness, blistering, peeling or loosening of the skin, including inside the mouth (this can be added for any serious or exfoliative rash that could lead to hospitalization) -signs of infection - fever or chills, cough, sore throat, pain or difficulty passing urine -signs and symptoms of kidney injury like trouble passing urine or change in the amount of urine -signs and symptoms of liver injury like dark yellow or brown urine; general ill feeling or flu-like symptoms; light-colored stools; loss of appetite; nausea; right upper belly pain; unusually weak or tired; yellowing of the eyes or skin -stomach pain -vomiting Side effects that usually do not require medical attention (report to your doctor or health care professional if they  continue or are bothersome): -headache -joint pain -muscle cramps or muscle pain This list may not describe all possible side effects. Call your doctor for medical advice about side effects. You may report side effects to FDA at 1-800-FDA-1088. Where should I keep my medicine? This drug is given in a hospital or clinic and will not be stored at home. NOTE: This sheet is a summary. It may not cover all possible information. If you have questions about this medicine, talk to your doctor, pharmacist, or health care provider.  2018 Elsevier/Gold Standard (2016-04-17 15:28:09)   

## 2018-02-28 LAB — HEPATITIS B CORE ANTIBODY, TOTAL: Hep B Core Total Ab: NEGATIVE

## 2018-02-28 LAB — HEPATITIS B SURFACE ANTIGEN: HEP B S AG: NEGATIVE

## 2018-02-28 LAB — HEPATITIS B CORE ANTIBODY, IGM: Hep B C IgM: NEGATIVE

## 2018-03-05 NOTE — Progress Notes (Signed)
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 The patient's last visit with [place] was on 02/18/18. She is accompanied today by her sister. The pt reports that she is doing well overall.   The pt reports that she is doing her best to stay warm. She had some excessive bruising develop on her hand this morning from labs, and she does take low dose aspirin daily. She notes that her first cycle of Rituxan was tolerated well.   She saw GI Dr Watt Climes in the interim, and will consider an endoscopy at a later time.   Lab results today (03/06/18) of CBC, CMP, and Reticulocytes is as follows: all values are WNL except for WBC at 11.5k, RBC at 2.07, HGB at 7.0, HCT at 21.1, MCV at 101.9, RDW at 18.5, Platelets at 505k, ANC at 8.7k, Potassium at 3.4, Total Protein at 6.3, Total Bilirubin at 2.2. LDH 03/06/18 is elevated 331  On review of systems, pt reports well-controlled back pain, and denies fatigue, leg swelling, abdominal pains, light headedness, dizziness, blood in the stools, and any other symptoms.    REVIEW OF SYSTEMS:    A 10+ POINT REVIEW OF SYSTEMS WAS OBTAINED including neurology, dermatology, psychiatry, cardiac, respiratory, lymph, extremities, GI, GU, Musculoskeletal, constitutional, breasts, reproductive, HEENT.  All pertinent positives are noted in the HPI.  All others are negative.   Past Medical History:  Diagnosis Date  . B12 deficiency   . Bilateral carotid artery stenosis    right CCA and ICA 1-39%/    left ICA 40-59%  per last duplex  10-19-2015  . COPD (chronic obstructive pulmonary disease) ([place])   . Diverticulosis of colon   .  History of diverticulitis of colon    2004  . 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 Renal and ureteric calculus    left    . Past Surgical History:  Procedure Laterality Date  . ABDOMINAL HYSTERECTOMY  1986  . APPENDECTOMY  1967  . BREAST EXCISIONAL BIOPSY Right 1984  . CATARACT EXTRACTION W/ INTRAOCULAR LENS IMPLANT Right 1990's   congential cataract  . CYSTOSCOPY W/ URETERAL STENT PLACEMENT  11/12/2015   Procedure: CYSTOSCOPY WITH RETROGRADE PYELOGRAM/URETERAL STENT PLACEMENT;  Surgeon: [name], MD;  Location: [facility];  Service: Urology;;  . Consuela Mimes W/ URETERAL STENT REMOVAL Left 11/22/2015   Procedure: CYSTOSCOPY WITH STENT REMOVAL;  Surgeon: [name], MD;  Location[facility];  Service: Urology;  Laterality: Left;  . CYSTOSCOPY/URETEROSCOPY/HOLMIUM LASER Left 11/22/2015   Procedure: CYSTOSCOPY/URETEROSCOPY/HOLMIUM LASER;  Surgeon: [name], MD;  Location: [facility];  Service: Urology;  Laterality: Left;  FLEX URETEROSCOPY  . FEMUR IM NAIL Left 08/04/2015   Procedure: INTRAMEDULLARY (IM) NAIL FEMORAL;  Surgeon: [name], MD;  Location: [facility];  Service: Orthopedics;  Laterality: Left;  . TONSILLECTOMY  age 34    . Social History   Tobacco Use  . Smoking status: Former Smoker    Packs/day: 1.00    Types: Cigarettes    Last attempt to quit: 08/03/2015    Years since quitting: 2.5  . Smokeless tobacco: Never  Used  Substance Use Topics  . Alcohol use: Yes    Comment: occ  . Drug use: Not on file    ALLERGIES:  is allergic to other and shellfish allergy.  MEDICATIONS:  Current Outpatient Medications  Medication Sig Dispense Refill  . acetaminophen (TYLENOL) 325 MG tablet Take 2 tablets (650 mg total) by mouth every 6 (six) hours as needed for mild pain (or Fever >/= 101).    . Cyanocobalamin ([employer] B-12) 1000 MCG TBCR TAKE 1 TABLET DAILY 30 tablet 11  . docusate sodium (COLACE) 100  MG capsule Take 1 capsule (100 mg total) by mouth daily.    . folic acid (FOLVITE) 1 MG tablet Take 1 tablet (1 mg total) by mouth daily. 90 tablet 11  . potassium chloride SA (K-DUR,KLOR-CON) 20 MEQ tablet Take 1 tablet (20 mEq total) by mouth 2 (two) times daily. F/u with PCP in 10-14 days to determine need for ongoing replacement 20 tablet 0  . Tetrahydrozoline HCl (VISINE OP) Apply 1 drop to eye daily as needed (allergies/dry eyes).    . traMADol (ULTRAM) 50 MG tablet Take 1 tablet (50 mg total) by mouth every 8 (eight) hours as needed. 30 tablet 0   No current facility-administered medications for this visit.     PHYSICAL EXAMINATION: ECOG PERFORMANCE STATUS: 1 - Symptomatic but completely ambulatory  Vitals:   03/06/18 1130  BP: (!) 139/46  Pulse: (!) 55  Resp: 17  Temp: 98.3 F (36.8 C)  SpO2: 100%    Filed Weights   03/06/18 1130  Weight: 96 lb 8 oz (43.8 kg)   .Body mass index is 18.85 kg/m.  GENERAL:alert, in no acute distress and comfortable SKIN: no acute rashes, no significant lesions EYES: conjunctiva are pink and non-injected, sclera anicteric OROPHARYNX: MMM, no exudates, no oropharyngeal erythema or ulceration NECK: supple, no JVD LYMPH:  no palpable lymphadenopathy in the cervical, axillary or inguinal regions LUNGS: clear to auscultation b/l with normal respiratory effort HEART: regular rate & rhythm ABDOMEN:  normoactive bowel sounds , non tender, not distended. Extremity: no pedal edema PSYCH: alert & oriented x 3 with fluent speech NEURO: no focal motor/sensory deficits   LABORATORY DATA:   I have reviewed the data as listed  . CBC Latest Ref Rng & Units 03/06/2018 02/27/2018 02/18/2018  WBC 3.9 - 10.3 K/uL 11.5(H) 10.5(H) 11.4(H)  Hemoglobin 11.6 - 15.9 g/dL 7.0(LL) 8.5(L) 6.0(LL)  Hematocrit 34.8 - 46.6 % 21.1(L) 24.5(L) 18.2(L)  Platelets 145 - 400 K/uL 505(H) 462(H) 610(H)    CMP Latest Ref Rng & Units 03/06/2018 02/27/2018 02/18/2018  Glucose  70 - 140 mg/dL 85 83 116  BUN 7 - 26 mg/dL 8 14 10   Creatinine 0.60 - 1.10 mg/dL 0.66 0.66 0.70  Sodium 136 - 145 mmol/L 144 140 141  Potassium 3.5 - 5.1 mmol/L 3.4(L) 4.0 3.5  Chloride 98 - 109 mmol/L 104 101 102  CO2 22 - 29 mmol/L 29 28 26   Calcium 8.4 - 10.4 mg/dL 8.7 9.1 9.1  Total Protein 6.4 - 8.3 g/dL 6.3(L) 7.0 6.7  Total Bilirubin 0.2 - 1.2 mg/dL 2.2(H) 2.9(H) 2.8(H)  Alkaline Phos 40 - 150 U/L 126 124 102  AST 5 - 34 U/L 15 19 20   ALT 0 - 55 U/L 8 9 11    . Lab Results  Component Value Date   LDH 331 (H) 03/06/2018          RADIOGRAPHIC STUDIES: I have personally reviewed the radiological images as  listed and agreed with the findings in the report. Ct Abdomen Pelvis W Contrast  Result Date: 02/04/2018 CLINICAL DATA:  Pt brought over from [facility] center for Hgb 4.4. History of diverticulitis. EXAM: CT ABDOMEN AND PELVIS WITH CONTRAST TECHNIQUE: Multidetector CT imaging of the abdomen and pelvis was performed using the standard protocol following bolus administration of intravenous contrast. CONTRAST:  114mL ISOVUE-300 IOPAMIDOL (ISOVUE-300) INJECTION 61% COMPARISON:  CT abdomen dated 11/12/2015. CT dated 02/28/2009 FINDINGS: Lower chest: No acute abnormality. Hepatobiliary: No focal liver abnormality is seen. Multiple stones within the slightly contracted gallbladder. No evidence of acute cholecystitis. No bile duct dilatation seen. Pancreas: Partially infiltrated with fat but otherwise unremarkable. Spleen: Normal in size without focal abnormality. Adrenals/Urinary Tract: Adrenal glands are unremarkable. Kidneys appear normal without mass, stone or hydronephrosis. No ureteral or bladder calculi identified. Stomach/Bowel: Chronic thickening of the walls of the sigmoid colon. Stable small amount of free fluid and/or peritoneal wall thickening in the lower pelvis is also chronic. Diverticulosis again noted throughout the sigmoid colon. No dilated large or small bowel loops.  Stomach is unremarkable, partially decompressed. Vascular/Lymphatic: Aortic atherosclerosis. No enlarged abdominal or pelvic lymph nodes. Reproductive: Status post hysterectomy. No acute appearing adnexal abnormality. Other: No evidence of acute hemorrhage within the abdomen or pelvis. No retroperitoneal hemorrhage. No abscess collection. No free intraperitoneal air. Musculoskeletal: No acute or suspicious osseous finding. Mild degenerative change throughout the slightly scoliotic thoracolumbar spine. Superficial soft tissues are unremarkable. IMPRESSION: 1. No acute findings within the abdomen or pelvis. No acute hemorrhage. No evidence of acute solid organ abnormality. No renal or ureteral calculi. No bowel obstruction. 2. Colonic diverticulosis without evidence of acute diverticulitis. The thickening of the walls of the sigmoid colon is stable compared to multiple prior studies indicating chronic process, with no change seen to suggest a superimposed acute diverticulitis. 3. Cholelithiasis without evidence of acute cholecystitis. 4. Aortic atherosclerosis. Electronically Signed   By: Franki Cabot M.D.   On: 02/04/2018 20:14    ASSESSMENT & PLAN:   #1: Cold Agglutinin related chronic hemolytic anemia. Her hemoglobin tends to be lower during winter and has been in the 6-8 range. Overall stable hemoglobin levels in the 6-7 range with folic acid replacement and cold avoidance.  #2 IgM lambda MGUS - cannot rule out possibility of lymphoplasmacytic lymphoma though less likely given chronicity of her condition.  #3 history of previous B12 deficiency  Plan  Discussed pt labwork today, 03/06/18; Hgb at 7.0, Total bilirubin decreased to 2.2, LDH decreased to 331, indicating her cold agglutinin related hemolysis may be improving -Pt will let me know if she develops any light headedness, fatigue or dizziness and I will order a transfusion -Proceed with C2 of Rituxan today and weekly to complete 4 doses -no  issues with 1st dose of Rituxan -Continue to avoid cold aggressively -Will see pt back in 2 weeks  -Pt needs a transfusion to keep Hgb at least above 6 -ALL TRANSFUSION PRODUCTS SHOULD BE PRE-WARMED USING A BLOOD WARMER INCLUDING IV FLUIDS, IF ANY. -Continue folic acid and P59 replacement as being done currently.  #4 hypokalemia -Advised continuing her 20mg  potassium pill; potassium normalized  #5. Low back pain -compression fracture L4 -New onset with tenderness upon palpation on physical exam  -I recommended a xray to rule out compression fracture from osteoporosis today - X ray showed 1. No acute fracture or dislocation identified. 2. Stable chronic mild loss of height of T11 vertebral body. 3. Mild levocurvature of thoracolumbar  junction. Mild spondylosis of visible lower thoracic and the lumbar spine.  -MRI L spine -Acute/subacute superior endplate fracture at L4 with loss of height of 10-20%. No retropulsed bone. This is quite likely the cause of the low back pain. This was probably present on the radiographs of 01/20/2018, but not diagnosable because of the spinal curvature and unlevel endplates. Old healed superior endplate fracture at D53 -she was also recommended to get in touch with her ortho physician Dr Percell Miller for further evaluation of her back pain. -Follow up with orthopedist regarding vertebral fracture -Will prescribe Tramadol for back pain   #6 Patient Active Problem List   Diagnosis Date Noted  . 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 Occult blood in stools 02/04/2018  . Ureteral calculus 11/12/2015  . Breast cancer screening, high risk patient 08/29/2015  . Acute delirium 08/07/2015  . Postoperative anemia due to acute blood loss 08/06/2015  . Protein-calorie malnutrition, severe 08/04/2015  . Closed intertrochanteric fracture of left femur ([place]) 08/03/2015  . Fall 08/03/2015  . Closed right hip  fracture ([place]) 08/03/2015  . Hyperbilirubinemia 08/01/2013  . Hypokalemia 07/30/2013  . Vitamin B 12 deficiency 08/07/2012  . 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 Tobacco abuse 06/10/2012   -Continue follow-up with primary care physician for management of other medical comorbidities  -ALL TRANSFUSION PRODUCTS SHOULD BE PRE-WARMED USING A BLOOD WARMER INCLUDING IV FLUIDS, IF ANY.    F/u per scheduled appointment for C3 and C4 of RItuxan with labs RTC with Dr Irene Limbo in 2 weeks with C4 of Rituxan     The toal time spent in the appt was 20 minutes and more than 50% was on counseling and direct patient cares.   Sullivan Lone MD [place] AAHIVMS [facility] Hematology/Oncology Physician [facility]  (Office):       [phone] (Work cell):  [phone] (Fax):           [phone]  I, Baldwin [place], am acting as a scribe for Dr Irene Limbo.   .I have reviewed the above documentation for accuracy and completeness, and I agree with the above. Brunetta Genera MD

## 2018-03-06 ENCOUNTER — Inpatient Hospital Stay (HOSPITAL_BASED_OUTPATIENT_CLINIC_OR_DEPARTMENT_OTHER): Payer: Medicare Other | Admitting: Hematology

## 2018-03-06 ENCOUNTER — Inpatient Hospital Stay: Payer: Medicare Other

## 2018-03-06 ENCOUNTER — Other Ambulatory Visit: Payer: Self-pay

## 2018-03-06 VITALS — BP 122/50 | HR 57 | Temp 99.1°F | Resp 18

## 2018-03-06 VITALS — BP 139/46 | HR 55 | Temp 98.3°F | Resp 17 | Ht 60.0 in | Wt 96.5 lb

## 2018-03-06 DIAGNOSIS — D591 Autoimmune hemolytic anemia, unspecified: Secondary | ICD-10-CM

## 2018-03-06 DIAGNOSIS — Z87891 Personal history of nicotine dependence: Secondary | ICD-10-CM

## 2018-03-06 DIAGNOSIS — E538 Deficiency of other specified B group vitamins: Secondary | ICD-10-CM

## 2018-03-06 DIAGNOSIS — I350 Nonrheumatic aortic (valve) stenosis: Secondary | ICD-10-CM | POA: Diagnosis not present

## 2018-03-06 DIAGNOSIS — Z87442 Personal history of urinary calculi: Secondary | ICD-10-CM | POA: Diagnosis not present

## 2018-03-06 DIAGNOSIS — M545 Low back pain: Secondary | ICD-10-CM

## 2018-03-06 DIAGNOSIS — I700 Atherosclerosis of aorta: Secondary | ICD-10-CM | POA: Diagnosis not present

## 2018-03-06 DIAGNOSIS — E876 Hypokalemia: Secondary | ICD-10-CM

## 2018-03-06 DIAGNOSIS — D472 Monoclonal gammopathy: Secondary | ICD-10-CM

## 2018-03-06 DIAGNOSIS — J449 Chronic obstructive pulmonary disease, unspecified: Secondary | ICD-10-CM

## 2018-03-06 DIAGNOSIS — Z5111 Encounter for antineoplastic chemotherapy: Secondary | ICD-10-CM | POA: Diagnosis not present

## 2018-03-06 DIAGNOSIS — E43 Unspecified severe protein-calorie malnutrition: Secondary | ICD-10-CM | POA: Diagnosis not present

## 2018-03-06 DIAGNOSIS — Z79899 Other long term (current) drug therapy: Secondary | ICD-10-CM

## 2018-03-06 DIAGNOSIS — D5912 Cold autoimmune hemolytic anemia: Secondary | ICD-10-CM

## 2018-03-06 LAB — CMP (CANCER CENTER ONLY)
ALBUMIN: 4.1 g/dL (ref 3.5–5.0)
ALT: 8 U/L (ref 0–55)
ANION GAP: 11 (ref 3–11)
AST: 15 U/L (ref 5–34)
Alkaline Phosphatase: 126 U/L (ref 40–150)
BUN: 8 mg/dL (ref 7–26)
CO2: 29 mmol/L (ref 22–29)
Calcium: 8.7 mg/dL (ref 8.4–10.4)
Chloride: 104 mmol/L (ref 98–109)
Creatinine: 0.66 mg/dL (ref 0.60–1.10)
GFR, Estimated: >60 mL/min (ref >60)
GLUCOSE: 85 mg/dL (ref 70–140)
Potassium: 3.4 mmol/L — ABNORMAL LOW (ref 3.5–5.1)
SODIUM: 144 mmol/L (ref 136–145)
TOTAL PROTEIN: 6.3 g/dL — AB (ref 6.4–8.3)
Total Bilirubin: 2.2 mg/dL — ABNORMAL HIGH (ref 0.2–1.2)

## 2018-03-06 LAB — CBC WITH DIFFERENTIAL (CANCER CENTER ONLY)
BASOS ABS: 0 10*3/uL (ref 0.0–0.1)
Basophils Relative: 0 %
Eosinophils Absolute: 0 10*3/uL (ref 0.0–0.5)
Eosinophils Relative: 0 %
HEMATOCRIT: 21.1 % — AB (ref 34.8–46.6)
HEMOGLOBIN: 7 g/dL — AB (ref 11.6–15.9)
Lymphocytes Relative: 20 %
Lymphs Abs: 2.2 10*3/uL (ref 0.9–3.3)
MCH: 33.8 pg (ref 25.1–34.0)
MCHC: 33.2 g/dL (ref 31.5–36.0)
MCV: 101.9 fL — AB (ref 79.5–101.0)
MONO ABS: 0.6 10*3/uL (ref 0.1–0.9)
MONOS PCT: 5 %
NEUTROS ABS: 8.7 10*3/uL — AB (ref 1.5–6.5)
NEUTROS PCT: 75 %
Platelet Count: 505 10*3/uL — ABNORMAL HIGH (ref 145–400)
RBC: 2.07 MIL/uL — ABNORMAL LOW (ref 3.70–5.45)
RDW: 18.5 % — AB (ref 11.2–14.5)
WBC Count: 11.5 10*3/uL — ABNORMAL HIGH (ref 3.9–10.3)

## 2018-03-06 LAB — LACTATE DEHYDROGENASE: LDH: 331 U/L — AB (ref 125–245)

## 2018-03-06 MED ORDER — ACETAMINOPHEN 325 MG PO TABS
ORAL_TABLET | ORAL | Status: AC
  Filled 2018-03-06: qty 2

## 2018-03-06 MED ORDER — DIPHENHYDRAMINE HCL 25 MG PO CAPS
50.0000 mg | ORAL_CAPSULE | Freq: Once | ORAL | Status: AC
  Administered 2018-03-06: 50 mg via ORAL

## 2018-03-06 MED ORDER — SODIUM CHLORIDE 0.9 % IV SOLN
375.0000 mg/m2 | Freq: Once | INTRAVENOUS | Status: AC
  Administered 2018-03-06: 500 mg via INTRAVENOUS
  Filled 2018-03-06: qty 50

## 2018-03-06 MED ORDER — CYANOCOBALAMIN 1000 MCG/ML IJ SOLN
INTRAMUSCULAR | Status: AC
  Filled 2018-03-06: qty 1

## 2018-03-06 MED ORDER — ACETAMINOPHEN 325 MG PO TABS
650.0000 mg | ORAL_TABLET | Freq: Once | ORAL | Status: AC
  Administered 2018-03-06: 650 mg via ORAL

## 2018-03-06 MED ORDER — FAMOTIDINE 20 MG PO TABS
ORAL_TABLET | ORAL | Status: AC
  Filled 2018-03-06: qty 2

## 2018-03-06 MED ORDER — DIPHENHYDRAMINE HCL 25 MG PO CAPS
ORAL_CAPSULE | ORAL | Status: AC
  Filled 2018-03-06: qty 2

## 2018-03-06 MED ORDER — SODIUM CHLORIDE 0.9 % IV SOLN
Freq: Once | INTRAVENOUS | Status: AC
  Administered 2018-03-06: 12:00:00 via INTRAVENOUS

## 2018-03-06 MED ORDER — FAMOTIDINE 20 MG PO TABS
40.0000 mg | ORAL_TABLET | Freq: Once | ORAL | Status: AC
  Administered 2018-03-06: 40 mg via ORAL

## 2018-03-06 MED ORDER — METHYLPREDNISOLONE SODIUM SUCC 125 MG IJ SOLR
80.0000 mg | Freq: Once | INTRAMUSCULAR | Status: AC
  Administered 2018-03-06: 80 mg via INTRAVENOUS

## 2018-03-06 MED ORDER — METHYLPREDNISOLONE SODIUM SUCC 125 MG IJ SOLR
INTRAMUSCULAR | Status: AC
  Filled 2018-03-06: qty 2

## 2018-03-06 NOTE — Patient Instructions (Signed)
Cancer Center Discharge Instructions for Patients Receiving Chemotherapy  Today you received the following chemotherapy agents:  Rituxan.  To help prevent nausea and vomiting after your treatment, we encourage you to take your nausea medication as directed.   If you develop nausea and vomiting that is not controlled by your nausea medication, call the clinic.   BELOW ARE SYMPTOMS THAT SHOULD BE REPORTED IMMEDIATELY:  *FEVER GREATER THAN 100.5 F  *CHILLS WITH OR WITHOUT FEVER  NAUSEA AND VOMITING THAT IS NOT CONTROLLED WITH YOUR NAUSEA MEDICATION  *UNUSUAL SHORTNESS OF BREATH  *UNUSUAL BRUISING OR BLEEDING  TENDERNESS IN MOUTH AND THROAT WITH OR WITHOUT PRESENCE OF ULCERS  *URINARY PROBLEMS  *BOWEL PROBLEMS  UNUSUAL RASH Items with * indicate a potential emergency and should be followed up as soon as possible.  Feel free to call the clinic should you have any questions or concerns. The clinic phone number is [phone].  Please show the CHEMO ALERT CARD at check-in to the Emergency Department and triage nurse.   

## 2018-03-06 NOTE — Progress Notes (Signed)
Per Dr. Irene Limbo, ok to treat with a Hgo of 7.0

## 2018-03-07 LAB — HEPATITIS B CORE ANTIBODY, IGM: Hep B C IgM: NEGATIVE

## 2018-03-07 LAB — HEPATITIS B SURFACE ANTIGEN: Hepatitis B Surface Ag: NEGATIVE

## 2018-03-07 LAB — HEPATITIS B CORE ANTIBODY, TOTAL: Hep B Core Total Ab: NEGATIVE

## 2018-03-13 ENCOUNTER — Inpatient Hospital Stay: Payer: Medicare Other

## 2018-03-13 ENCOUNTER — Other Ambulatory Visit: Payer: Self-pay | Admitting: *Deleted

## 2018-03-13 ENCOUNTER — Other Ambulatory Visit: Payer: Self-pay

## 2018-03-13 VITALS — BP 122/49 | HR 56 | Temp 98.3°F | Resp 18

## 2018-03-13 DIAGNOSIS — D591 Autoimmune hemolytic anemia, unspecified: Secondary | ICD-10-CM

## 2018-03-13 DIAGNOSIS — D5912 Cold autoimmune hemolytic anemia: Secondary | ICD-10-CM

## 2018-03-13 LAB — CBC WITH DIFFERENTIAL (CANCER CENTER ONLY)
BASOS PCT: 0 %
Basophils Absolute: 0 10*3/uL (ref 0.0–0.1)
EOS PCT: 1 %
Eosinophils Absolute: 0.1 10*3/uL (ref 0.0–0.5)
HEMATOCRIT: 28.3 % — AB (ref 34.8–46.6)
Hemoglobin: 7.5 g/dL — ABNORMAL LOW (ref 11.6–15.9)
Lymphocytes Relative: 29 %
Lymphs Abs: 2.3 10*3/uL (ref 0.9–3.3)
MCH: 33.8 pg (ref 25.1–34.0)
MCHC: 26.5 g/dL — AB (ref 31.5–36.0)
MCV: 127.5 fL — AB (ref 79.5–101.0)
MONO ABS: 0.5 10*3/uL (ref 0.1–0.9)
MONOS PCT: 7 %
NEUTROS ABS: 4.9 10*3/uL (ref 1.5–6.5)
Neutrophils Relative %: 63 %
PLATELETS: 358 10*3/uL (ref 145–400)
RBC: 2.22 MIL/uL — ABNORMAL LOW (ref 3.70–5.45)
RDW: 20.1 % — AB (ref 11.2–14.5)
WBC Count: 7.9 10*3/uL (ref 3.9–10.3)

## 2018-03-13 LAB — SAMPLE TO BLOOD BANK

## 2018-03-13 LAB — IRON AND TIBC
Iron: 180 ug/dL — ABNORMAL HIGH (ref 41–142)
SATURATION RATIOS: 91 % — AB (ref 21–57)
TIBC: 198 ug/dL — ABNORMAL LOW (ref 236–444)
UIBC: 17 ug/dL

## 2018-03-13 LAB — FERRITIN: FERRITIN: 764 ng/mL — AB (ref 9–269)

## 2018-03-13 MED ORDER — FAMOTIDINE 20 MG PO TABS
40.0000 mg | ORAL_TABLET | Freq: Once | ORAL | Status: AC
  Administered 2018-03-13: 40 mg via ORAL

## 2018-03-13 MED ORDER — METHYLPREDNISOLONE SODIUM SUCC 125 MG IJ SOLR
INTRAMUSCULAR | Status: AC
Start: 2018-03-13 — End: ?
  Filled 2018-03-13: qty 2

## 2018-03-13 MED ORDER — DIPHENHYDRAMINE HCL 25 MG PO CAPS
ORAL_CAPSULE | ORAL | Status: AC
  Filled 2018-03-13: qty 2

## 2018-03-13 MED ORDER — DIPHENHYDRAMINE HCL 25 MG PO CAPS
50.0000 mg | ORAL_CAPSULE | Freq: Once | ORAL | Status: AC
  Administered 2018-03-13: 50 mg via ORAL

## 2018-03-13 MED ORDER — ACETAMINOPHEN 325 MG PO TABS
ORAL_TABLET | ORAL | Status: AC
  Filled 2018-03-13: qty 2

## 2018-03-13 MED ORDER — SODIUM CHLORIDE 0.9 % IV SOLN
Freq: Once | INTRAVENOUS | Status: AC
  Administered 2018-03-13: 10:00:00 via INTRAVENOUS

## 2018-03-13 MED ORDER — METHYLPREDNISOLONE SODIUM SUCC 125 MG IJ SOLR
80.0000 mg | Freq: Once | INTRAMUSCULAR | Status: AC
  Administered 2018-03-13: 80 mg via INTRAVENOUS

## 2018-03-13 MED ORDER — SODIUM CHLORIDE 0.9 % IV SOLN
375.0000 mg/m2 | Freq: Once | INTRAVENOUS | Status: AC
  Administered 2018-03-13: 500 mg via INTRAVENOUS
  Filled 2018-03-13: qty 50

## 2018-03-13 MED ORDER — ACETAMINOPHEN 325 MG PO TABS
650.0000 mg | ORAL_TABLET | Freq: Once | ORAL | Status: AC
  Administered 2018-03-13: 650 mg via ORAL

## 2018-03-13 MED ORDER — FAMOTIDINE 20 MG PO TABS
ORAL_TABLET | ORAL | Status: AC
  Filled 2018-03-13: qty 2

## 2018-03-13 NOTE — Patient Instructions (Signed)
[facility] Discharge Instructions for Patients Receiving Chemotherapy  Today you received the following chemotherapy agents:  Rituxan.  To help prevent nausea and vomiting after your treatment, we encourage you to take your nausea medication as directed.   If you develop nausea and vomiting that is not controlled by your nausea medication, call the clinic.   BELOW ARE SYMPTOMS THAT SHOULD BE REPORTED IMMEDIATELY:  *FEVER GREATER THAN 100.5 F  *CHILLS WITH OR WITHOUT FEVER  NAUSEA AND VOMITING THAT IS NOT CONTROLLED WITH YOUR NAUSEA MEDICATION  *UNUSUAL SHORTNESS OF BREATH  *UNUSUAL BRUISING OR BLEEDING  TENDERNESS IN MOUTH AND THROAT WITH OR WITHOUT PRESENCE OF ULCERS  *URINARY PROBLEMS  *BOWEL PROBLEMS  UNUSUAL RASH Items with * indicate a potential emergency and should be followed up as soon as possible.  Feel free to call the clinic should you have any questions or concerns. The clinic phone number is [phone].  Please show the CHEMO ALERT CARD at check-in to the Emergency Department and triage nurse.   

## 2018-03-19 NOTE — Progress Notes (Signed)
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 The patient's last visit with [place] was on 03/06/18. She is accompanied today by her sister. The pt reports that she is doing well overall.   The pt reports that she has had no new concerns since our last visit. She notes that her energy levels have been fine and is not having any problems tolerating the Rituxan.   She notes that her back pain continues and she is using Lidocaine patches. She has not been replacing them every 12 hours, and has not yet spoken with her orthopedic doctors. She notes that her back pain has improved slightly and she is no longer thinking about further invention. She is not needing to use Tramadol for her back pain at this time.   Lab results today (03/20/18) of CBC and Reticulocytes is as follows: all values are WNL except for RBC at 2.18, HGB at 7.4, HCT at 23.0, MCV at 105.5, RDW at 18.8, PLT at 436k, Retic ct pct at 5.8%, Retic ct abs at 126.4.  On review of systems, pt reports stable energy levels, improving back pain and denies light headedness, dizziness, fevers, allergies, skin rashes, and any other symptoms.   REVIEW OF SYSTEMS:    A 10+ POINT REVIEW OF SYSTEMS WAS OBTAINED including neurology, dermatology, psychiatry, cardiac, respiratory, lymph, extremities, GI, GU, Musculoskeletal, constitutional, breasts, reproductive, HEENT.  All pertinent positives are noted in the HPI.  All others are negative.   Past Medical History:  Diagnosis Date  . B12 deficiency   . Bilateral carotid artery stenosis    right CCA and ICA 1-39%/    left ICA 40-59%  per last duplex   10-19-2015  . COPD (chronic obstructive pulmonary disease) ([place])   . Diverticulosis of colon   . History of diverticulitis of colon    2004  . 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 Renal and ureteric calculus    left    . Past Surgical History:  Procedure Laterality Date  . ABDOMINAL HYSTERECTOMY  1986  . APPENDECTOMY  1967  . BREAST EXCISIONAL BIOPSY Right 1984  . CATARACT EXTRACTION W/ INTRAOCULAR LENS IMPLANT Right 1990's   congential cataract  . CYSTOSCOPY W/ URETERAL STENT PLACEMENT  11/12/2015   Procedure: CYSTOSCOPY WITH RETROGRADE PYELOGRAM/URETERAL STENT PLACEMENT;  Surgeon: [name], MD;  Location: [facility];  Service: Urology;;  . Consuela Mimes W/ URETERAL STENT REMOVAL Left 11/22/2015   Procedure: CYSTOSCOPY WITH STENT REMOVAL;  Surgeon: [name], MD;  Location: [facility];  Service: Urology;  Laterality: Left;  . CYSTOSCOPY/URETEROSCOPY/HOLMIUM LASER Left 11/22/2015   Procedure: CYSTOSCOPY/URETEROSCOPY/HOLMIUM LASER;  Surgeon: [name], MD;  Location: [facility];  Service: Urology;  Laterality: Left;  FLEX URETEROSCOPY  . FEMUR IM NAIL Left 08/04/2015   Procedure: INTRAMEDULLARY (IM) NAIL FEMORAL;  Surgeon: [name], MD;  Location: [facility];  Service: Orthopedics;  Laterality: Left;  . TONSILLECTOMY  age 11    . Social History   Tobacco Use  . Smoking status: Former Smoker    Packs/day: 1.00    Types: Cigarettes  Last attempt to quit: 08/03/2015    Years since quitting: 2.6  . Smokeless tobacco: Never Used  Substance Use Topics  . Alcohol use: Yes    Comment: occ  . Drug use: Not on file    ALLERGIES:  is allergic to other and shellfish allergy.  MEDICATIONS:  Current Outpatient Medications  Medication Sig Dispense Refill  . acetaminophen (TYLENOL) 325 MG tablet Take 2 tablets (650 mg total) by mouth every 6 (six) hours as needed for mild pain (or Fever >/= 101).    .  Cyanocobalamin ([employer] B-12) 1000 MCG TBCR TAKE 1 TABLET DAILY 30 tablet 11  . docusate sodium (COLACE) 100 MG capsule Take 1 capsule (100 mg total) by mouth daily.    . folic acid (FOLVITE) 1 MG tablet Take 1 tablet (1 mg total) by mouth daily. 90 tablet 11  . potassium chloride SA (K-DUR,KLOR-CON) 20 MEQ tablet Take 1 tablet (20 mEq total) by mouth 2 (two) times daily. F/u with PCP in 10-14 days to determine need for ongoing replacement 20 tablet 0  . Tetrahydrozoline HCl (VISINE OP) Apply 1 drop to eye daily as needed (allergies/dry eyes).    . traMADol (ULTRAM) 50 MG tablet Take 1 tablet (50 mg total) by mouth every 8 (eight) hours as needed. 30 tablet 0   No current facility-administered medications for this visit.     PHYSICAL EXAMINATION: ECOG PERFORMANCE STATUS: 1 - Symptomatic but completely ambulatory  Vitals:   03/20/18 0926  BP: (!) 136/55  Pulse: (!) 55  Resp: 17  Temp: 98.3 F (36.8 C)  SpO2: 100%    Filed Weights   03/20/18 0926  Weight: 97 lb 11.2 oz (44.3 kg)   .Body mass index is 19.08 kg/m.  GENERAL:alert, in no acute distress and comfortable SKIN: no acute rashes, no significant lesions EYES: conjunctiva are pink and non-injected, sclera anicteric OROPHARYNX: MMM, no exudates, no oropharyngeal erythema or ulceration NECK: supple, no JVD LYMPH:  no palpable lymphadenopathy in the cervical, axillary or inguinal regions LUNGS: clear to auscultation b/l with normal respiratory effort HEART: regular rate & rhythm ABDOMEN:  normoactive bowel sounds , non tender, not distended. Extremity: no pedal edema PSYCH: alert & oriented x 3 with fluent speech NEURO: no focal motor/sensory deficits   LABORATORY DATA:   I have reviewed the data as listed  . CBC Latest Ref Rng & Units 03/20/2018 03/13/2018 03/06/2018  WBC 3.9 - 10.3 K/uL 8.8 7.9 11.5(H)  Hemoglobin 11.6 - 15.9 g/dL 7.4(L) 7.5(L) 7.0(LL)  Hematocrit 34.8 - 46.6 % 23.0(L) 28.3(L) 21.1(L)  Platelets 145 -  400 K/uL 436(H) 358 505(H)    CMP Latest Ref Rng & Units 03/06/2018 02/27/2018 02/18/2018  Glucose 70 - 140 mg/dL 85 83 116  BUN 7 - 26 mg/dL 8 14 10   Creatinine 0.60 - 1.10 mg/dL 0.66 0.66 0.70  Sodium 136 - 145 mmol/L 144 140 141  Potassium 3.5 - 5.1 mmol/L 3.4(L) 4.0 3.5  Chloride 98 - 109 mmol/L 104 101 102  CO2 22 - 29 mmol/L 29 28 26   Calcium 8.4 - 10.4 mg/dL 8.7 9.1 9.1  Total Protein 6.4 - 8.3 g/dL 6.3(L) 7.0 6.7  Total Bilirubin 0.2 - 1.2 mg/dL 2.2(H) 2.9(H) 2.8(H)  Alkaline Phos 40 - 150 U/L 126 124 102  AST 5 - 34 U/L 15 19 20   ALT 0 - 55 U/L 8 9 11    . Lab Results  Component Value Date   LDH 331 (H) 03/06/2018  RADIOGRAPHIC STUDIES: I have personally reviewed the radiological images as listed and agreed with the findings in the report. No results found.  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 Her hemoglobin tends to be lower during winter and has been in the 6-8 range. Overall stable hemoglobin levels in the 6-7 range with folic acid replacement and cold avoidance.  #2 IgM lambda MGUS - cannot rule out possibility of lymphoplasmacytic lymphoma though less likely given chronicity of her condition.  #3 history of previous B12 deficiency  Plan  -Pt will let me know if she develops any light headedness, fatigue or dizziness and I will order a transfusion -no issues with  First 3 doses of f Rituxan -Continue to avoid cold aggressively -Pt needs a transfusion to keep Hgb at least above 6 -ALL TRANSFUSION PRODUCTS SHOULD BE PRE-WARMED USING A BLOOD WARMER INCLUDING IV FLUIDS, IF ANY. -Continue folic acid and N27 replacement as being done currently. -Discussed pt labwork today, 03/20/18; HGB holding at 7.4, PLT at 436k -Begin using Lidocaine patches every 12 hours -Would recommend that PCP address back pain with Calcitonin or other interventions  -Will see pt back in one month -The pt has no prohibitive toxicities from  continuing with her last infusion of Rituxan at this time.   -Pt will let me know if she develops any new or concerning symptoms   #4 hypokalemia -Advised continuing her 20mg  potassium pill; potassium normalized  #5. Low back pain -compression fracture L4 -New onset with tenderness upon palpation on physical exam  -I recommended a xray to rule out compression fracture from osteoporosis today - X ray showed 1. No acute fracture or dislocation identified. 2. Stable chronic mild loss of height of T11 vertebral body. 3. Mild levocurvature of thoracolumbar junction. Mild spondylosis of visible lower thoracic and the lumbar spine.  -MRI L spine -Acute/subacute superior endplate fracture at L4 with loss of height of 10-20%. No retropulsed bone. This is quite likely the cause of the low back pain. This was probably present on the radiographs of 01/20/2018, but not diagnosable because of the spinal curvature and unlevel endplates. Old healed superior endplate fracture at P82 -she was also recommended to get in touch with her ortho physician Dr Percell Miller for further evaluation of her back pain. -Follow up with orthopedist regarding vertebral fracture -prn Tramadol for back pain   #6 Patient Active Problem List   Diagnosis Date Noted  . 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 Occult blood in stools 02/04/2018  . Ureteral calculus 11/12/2015  . Breast cancer screening, high risk patient 08/29/2015  . Acute delirium 08/07/2015  . Postoperative anemia due to acute blood loss 08/06/2015  . Protein-calorie malnutrition, severe 08/04/2015  . Closed intertrochanteric fracture of left femur ([place]) 08/03/2015  . Fall 08/03/2015  . Closed right hip fracture ([place]) 08/03/2015  . Hyperbilirubinemia 08/01/2013  . Hypokalemia 07/30/2013  . Vitamin B 12 deficiency 08/07/2012  . 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 Tobacco  abuse 06/10/2012   -Continue follow-up with primary care physician for management of other medical comorbidities  -ALL TRANSFUSION PRODUCTS SHOULD BE PRE-WARMED USING A BLOOD WARMER INCLUDING IV FLUIDS, IF ANY.    RTC with Dr Irene Limbo in 4 weeks with labs    . The total time spent in the appointment was 23 minutes and more than 50% was on counseling and direct patient cares.  Sullivan Lone MD [place] AAHIVMS [facility] Hematology/Oncology Physician [facility]  (Office):       [phone] (Work cell):  [phone] (Fax):           [phone]  I, Baldwin Jamaica, am acting as a scribe for Dr Irene Limbo.   .I have reviewed the above documentation for accuracy and completeness, and I agree with the above. Brunetta Genera MD

## 2018-03-20 ENCOUNTER — Inpatient Hospital Stay: Payer: Medicare Other

## 2018-03-20 ENCOUNTER — Telehealth: Payer: Self-pay | Admitting: Hematology

## 2018-03-20 ENCOUNTER — Inpatient Hospital Stay: Payer: Medicare Other | Admitting: Hematology

## 2018-03-20 VITALS — BP 139/51 | HR 56 | Temp 98.9°F | Resp 18

## 2018-03-20 VITALS — BP 136/55 | HR 55 | Temp 98.3°F | Resp 17 | Ht 60.0 in | Wt 97.7 lb

## 2018-03-20 DIAGNOSIS — D472 Monoclonal gammopathy: Secondary | ICD-10-CM | POA: Diagnosis not present

## 2018-03-20 DIAGNOSIS — Z87891 Personal history of nicotine dependence: Secondary | ICD-10-CM

## 2018-03-20 DIAGNOSIS — Z87442 Personal history of urinary calculi: Secondary | ICD-10-CM

## 2018-03-20 DIAGNOSIS — Z5111 Encounter for antineoplastic chemotherapy: Secondary | ICD-10-CM

## 2018-03-20 DIAGNOSIS — E538 Deficiency of other specified B group vitamins: Secondary | ICD-10-CM

## 2018-03-20 DIAGNOSIS — D538 Other specified nutritional anemias: Secondary | ICD-10-CM

## 2018-03-20 DIAGNOSIS — D591 Autoimmune hemolytic anemia, unspecified: Secondary | ICD-10-CM

## 2018-03-20 DIAGNOSIS — Z79899 Other long term (current) drug therapy: Secondary | ICD-10-CM

## 2018-03-20 DIAGNOSIS — M545 Low back pain: Secondary | ICD-10-CM | POA: Diagnosis not present

## 2018-03-20 DIAGNOSIS — D5912 Cold autoimmune hemolytic anemia: Secondary | ICD-10-CM

## 2018-03-20 DIAGNOSIS — E43 Unspecified severe protein-calorie malnutrition: Secondary | ICD-10-CM

## 2018-03-20 DIAGNOSIS — I350 Nonrheumatic aortic (valve) stenosis: Secondary | ICD-10-CM

## 2018-03-20 DIAGNOSIS — E876 Hypokalemia: Secondary | ICD-10-CM

## 2018-03-20 DIAGNOSIS — J449 Chronic obstructive pulmonary disease, unspecified: Secondary | ICD-10-CM

## 2018-03-20 DIAGNOSIS — I700 Atherosclerosis of aorta: Secondary | ICD-10-CM

## 2018-03-20 LAB — CBC WITH DIFFERENTIAL (CANCER CENTER ONLY)
BASOS ABS: 0 10*3/uL (ref 0.0–0.1)
Basophils Relative: 1 %
EOS ABS: 0.1 10*3/uL (ref 0.0–0.5)
EOS PCT: 1 %
HCT: 23 % — ABNORMAL LOW (ref 34.8–46.6)
HEMOGLOBIN: 7.4 g/dL — AB (ref 11.6–15.9)
LYMPHS ABS: 2.2 10*3/uL (ref 0.9–3.3)
LYMPHS PCT: 25 %
MCH: 33.9 pg (ref 25.1–34.0)
MCHC: 32.2 g/dL (ref 31.5–36.0)
MCV: 105.5 fL — AB (ref 79.5–101.0)
Monocytes Absolute: 0.5 10*3/uL (ref 0.1–0.9)
Monocytes Relative: 6 %
NEUTROS PCT: 67 %
Neutro Abs: 6 10*3/uL (ref 1.5–6.5)
PLATELETS: 436 10*3/uL — AB (ref 145–400)
RBC: 2.18 MIL/uL — AB (ref 3.70–5.45)
RDW: 18.8 % — ABNORMAL HIGH (ref 11.2–14.5)
WBC: 8.8 10*3/uL (ref 3.9–10.3)

## 2018-03-20 LAB — RETICULOCYTES
RBC.: 2.18 MIL/uL — ABNORMAL LOW (ref 3.70–5.45)
RETIC COUNT ABSOLUTE: 126.4 10*3/uL — AB (ref 33.7–90.7)
RETIC CT PCT: 5.8 % — AB (ref 0.7–2.1)

## 2018-03-20 MED ORDER — FAMOTIDINE 20 MG PO TABS
40.0000 mg | ORAL_TABLET | Freq: Once | ORAL | Status: AC
  Administered 2018-03-20: 40 mg via ORAL

## 2018-03-20 MED ORDER — SODIUM CHLORIDE 0.9 % IV SOLN
Freq: Once | INTRAVENOUS | Status: AC
  Administered 2018-03-20: 11:00:00 via INTRAVENOUS

## 2018-03-20 MED ORDER — FAMOTIDINE 20 MG PO TABS
ORAL_TABLET | ORAL | Status: AC
  Filled 2018-03-20: qty 2

## 2018-03-20 MED ORDER — ACETAMINOPHEN 325 MG PO TABS
ORAL_TABLET | ORAL | Status: AC
  Filled 2018-03-20: qty 2

## 2018-03-20 MED ORDER — METHYLPREDNISOLONE SODIUM SUCC 125 MG IJ SOLR
INTRAMUSCULAR | Status: AC
  Filled 2018-03-20: qty 2

## 2018-03-20 MED ORDER — DIPHENHYDRAMINE HCL 25 MG PO CAPS
50.0000 mg | ORAL_CAPSULE | Freq: Once | ORAL | Status: AC
  Administered 2018-03-20: 50 mg via ORAL

## 2018-03-20 MED ORDER — DIPHENHYDRAMINE HCL 25 MG PO CAPS
ORAL_CAPSULE | ORAL | Status: AC
Start: 2018-03-20 — End: ?
  Filled 2018-03-20: qty 2

## 2018-03-20 MED ORDER — METHYLPREDNISOLONE SODIUM SUCC 125 MG IJ SOLR
80.0000 mg | Freq: Once | INTRAMUSCULAR | Status: AC
  Administered 2018-03-20: 80 mg via INTRAVENOUS

## 2018-03-20 MED ORDER — SODIUM CHLORIDE 0.9 % IV SOLN
375.0000 mg/m2 | Freq: Once | INTRAVENOUS | Status: AC
  Administered 2018-03-20: 500 mg via INTRAVENOUS
  Filled 2018-03-20: qty 50

## 2018-03-20 MED ORDER — ACETAMINOPHEN 325 MG PO TABS
650.0000 mg | ORAL_TABLET | Freq: Once | ORAL | Status: AC
  Administered 2018-03-20: 650 mg via ORAL

## 2018-03-20 NOTE — Progress Notes (Signed)
Per Dr. Irene Limbo it is ok to treat with Hemoglobin 7.4 and it is ok to treat without a CMP today.

## 2018-03-20 NOTE — Telephone Encounter (Signed)
Scheduled appt per 6/28 los - gave patient AVS and calender per los.  

## 2018-03-20 NOTE — Patient Instructions (Signed)
[facility] Discharge Instructions for Patients Receiving Chemotherapy  Today you received the following chemotherapy agents:  Rituxan.  To help prevent nausea and vomiting after your treatment, we encourage you to take your nausea medication as directed.   If you develop nausea and vomiting that is not controlled by your nausea medication, call the clinic.   BELOW ARE SYMPTOMS THAT SHOULD BE REPORTED IMMEDIATELY:  *FEVER GREATER THAN 100.5 F  *CHILLS WITH OR WITHOUT FEVER  NAUSEA AND VOMITING THAT IS NOT CONTROLLED WITH YOUR NAUSEA MEDICATION  *UNUSUAL SHORTNESS OF BREATH  *UNUSUAL BRUISING OR BLEEDING  TENDERNESS IN MOUTH AND THROAT WITH OR WITHOUT PRESENCE OF ULCERS  *URINARY PROBLEMS  *BOWEL PROBLEMS  UNUSUAL RASH Items with * indicate a potential emergency and should be followed up as soon as possible.  Feel free to call the clinic should you have any questions or concerns. The clinic phone number is [phone].  Please show the CHEMO ALERT CARD at check-in to the Emergency Department and triage nurse.   

## 2018-04-16 NOTE — Progress Notes (Signed)
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 The patient's last visit with [place] was on 03/20/18. She is accompanied today by her sister. The pt reports that she is doing well overall. Verbal consent has been given by the pt for a clinical observer, Mathis Fare, to be present.   The pt reports that she is eating well and taking her potassium replacement once a day, not realizing she was to take it twice each day. She has not been taking water pills, and has been eating lots of sweet potatoes and bananas. She has also been taking her vitamins. She notes that her energy levels have been fair.   She saw her orthopedist Dr. Percell Miller in the interim and put her on prednisone. She notes pain in her left leg, hip pain and lower back pain. She continues to see her PCP each January.   Lab results today (04/17/18) of CBC w/diff, CMP, and Reticulocytes is as follows: all values are WNL except for WBC at 14.4, RBC at 2.16, HGB at 8.2, HCT at 23.8, MCV at 110.2, MCH at 38.0, RDW at 19.7, PLT at 616k, ANC at 11.0k, Retic ct pct at 8.6%, Retic ct abs at 185.8, Potassium at 2.9, Alk Phos at 161, Total Bilirubin at 2.8. LDH 04/17/18 is at 278  On review of systems, pt reports fair energy levels, back pain, left hip and leg pain, and denies fevers, chills, night sweats, concerns for infection, abdominal pains, abnormal bowel movements, and any other symptoms.   REVIEW OF SYSTEMS:    A 10+ POINT REVIEW OF SYSTEMS WAS OBTAINED including neurology, dermatology, psychiatry, cardiac, respiratory, lymph, extremities, GI, GU, Musculoskeletal, constitutional, breasts,  reproductive, HEENT.  All pertinent positives are noted in the HPI.  All others are negative.   Past Medical History:  Diagnosis Date  . B12 deficiency   . Bilateral carotid artery stenosis    right CCA and ICA 1-39%/    left ICA 40-59%  per last duplex  10-19-2015  . COPD (chronic obstructive pulmonary disease) ([place])   . Diverticulosis of colon   . History of diverticulitis of colon    2004  . 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 Renal and ureteric calculus    left    . Past Surgical History:  Procedure Laterality Date  . ABDOMINAL HYSTERECTOMY  1986  . APPENDECTOMY  1967  . BREAST EXCISIONAL BIOPSY Right 1984  . CATARACT EXTRACTION W/ INTRAOCULAR LENS IMPLANT Right 1990's   congential cataract  . CYSTOSCOPY W/ URETERAL STENT PLACEMENT  11/12/2015   Procedure: CYSTOSCOPY WITH RETROGRADE PYELOGRAM/URETERAL STENT PLACEMENT;  Surgeon: [name], MD;  Location: [facility];  Service: Urology;;  . Consuela Mimes W/ URETERAL STENT REMOVAL Left 11/22/2015   Procedure: CYSTOSCOPY WITH STENT REMOVAL;  Surgeon: [name], MD;  Location[facility];  Service: Urology;  Laterality: Left;  . CYSTOSCOPY/URETEROSCOPY/HOLMIUM LASER Left 11/22/2015   Procedure: CYSTOSCOPY/URETEROSCOPY/HOLMIUM LASER;  Surgeon: [name], MD;  Location: [facility];  Service: Urology;  Laterality: Left;  FLEX URETEROSCOPY  . FEMUR IM NAIL Left 08/04/2015   Procedure: INTRAMEDULLARY (IM)  NAIL FEMORAL;  Surgeon: [name], MD;  Location: [place];  Service: Orthopedics;  Laterality: Left;  . TONSILLECTOMY  age 77    . Social History   Tobacco Use  . Smoking status: Former Smoker    Packs/day: 1.00    Types: Cigarettes    Last attempt to quit: 08/03/2015    Years since quitting: 2.7  . Smokeless tobacco: Never Used  Substance Use Topics  . Alcohol use: Yes    Comment: occ  . Drug use: Not on file    ALLERGIES:  is allergic to other  and shellfish allergy.  MEDICATIONS:  Current Outpatient Medications  Medication Sig Dispense Refill  . acetaminophen (TYLENOL) 325 MG tablet Take 2 tablets (650 mg total) by mouth every 6 (six) hours as needed for mild pain (or Fever >/= 101).    . Cyanocobalamin ([employer] B-12) 1000 MCG TBCR TAKE 1 TABLET DAILY 30 tablet 11  . docusate sodium (COLACE) 100 MG capsule Take 1 capsule (100 mg total) by mouth daily.    . folic acid (FOLVITE) 1 MG tablet Take 1 tablet (1 mg total) by mouth daily. 90 tablet 11  . potassium chloride SA (K-DUR,KLOR-CON) 20 MEQ tablet Take 1 tablet (20 mEq total) by mouth 2 (two) times daily. F/u with PCP in 10-14 days to determine need for ongoing replacement 20 tablet 0  . Tetrahydrozoline HCl (VISINE OP) Apply 1 drop to eye daily as needed (allergies/dry eyes).    . traMADol (ULTRAM) 50 MG tablet Take 1 tablet (50 mg total) by mouth every 8 (eight) hours as needed. 30 tablet 0   No current facility-administered medications for this visit.     PHYSICAL EXAMINATION: ECOG PERFORMANCE STATUS: 1 - Symptomatic but completely ambulatory  Vitals:   04/17/18 0940  BP: (!) 142/44  Pulse: 71  Resp: 17  Temp: 98.4 F (36.9 C)  SpO2: 98%    Filed Weights   04/17/18 0940  Weight: 91 lb (41.3 kg)   .Body mass index is 17.77 kg/m.  GENERAL:alert, in no acute distress and comfortable SKIN: no acute rashes, no significant lesions EYES: conjunctiva are pink and non-injected, sclera anicteric OROPHARYNX: MMM, no exudates, no oropharyngeal erythema or ulceration NECK: supple, no JVD LYMPH:  no palpable lymphadenopathy in the cervical, axillary or inguinal regions LUNGS: clear to auscultation b/l with normal respiratory effort HEART: regular rate & rhythm ABDOMEN:  normoactive bowel sounds , non tender, not distended. No palpable hepatosplenomegaly.  Extremity: no pedal edema PSYCH: alert & oriented x 3 with fluent speech NEURO: no focal motor/sensory deficits    LABORATORY DATA:   I have reviewed the data as listed  . CBC Latest Ref Rng & Units 04/17/2018 03/20/2018 03/13/2018  WBC 3.9 - 10.3 K/uL 14.4(H) 8.8 7.9  Hemoglobin 11.6 - 15.9 g/dL 8.2(L) 7.4(L) 7.5(L)  Hematocrit 34.8 - 46.6 % 23.8(L) 23.0(L) 28.3(L)  Platelets 145 - 400 K/uL 616(H) 436(H) 358    CMP Latest Ref Rng & Units 04/17/2018 03/06/2018 02/27/2018  Glucose 70 - 99 mg/dL 86 85 83  BUN 8 - 23 mg/dL '12 8 14  ' Creatinine 0.44 - 1.00 mg/dL 0.68 0.66 0.66  Sodium 135 - 145 mmol/L 142 144 140  Potassium 3.5 - 5.1 mmol/L 2.9(LL) 3.4(L) 4.0  Chloride 98 - 111 mmol/L 101 104 101  CO2 22 - 32 mmol/L '30 29 28  ' Calcium 8.9 - 10.3 mg/dL 9.2 8.7 9.1  Total Protein 6.5 - 8.1 g/dL 7.0 6.3(L) 7.0  Total Bilirubin 0.3 - 1.2 mg/dL 2.8(H) 2.2(H) 2.9(H)  Alkaline Phos 38 - 126 U/L 161(H) 126 124  AST 15 - 41 U/L '17 15 19  ' ALT 0 - 44 U/L '10 8 9   ' . Lab Results  Component Value Date   LDH 278 (H) 04/17/2018          RADIOGRAPHIC STUDIES: I have personally reviewed the radiological images as listed and agreed with the findings in the report. No results found.  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 Her hemoglobin tends to be lower during winter and has been in the 6-8 range. Overall stable hemoglobin levels in the 6-7 range with folic acid replacement and cold avoidance.  #2 IgM lambda MGUS - cannot rule out possibility of lymphoplasmacytic lymphoma though less likely given chronicity of her condition.  #3 history of previous B12 deficiency  Plan:  -Continue to avoid cold aggressively -Pt needs a transfusion to keep Hgb at least above 6 -ALL TRANSFUSION PRODUCTS SHOULD BE PRE-WARMED USING A BLOOD WARMER INCLUDING IV FLUIDS, IF ANY. -Continue folic acid and X91 replacement as being done currently. -Would recommend that PCP address back pain with Calcitonin or other interventions  -Discussed pt labwork today, 04/17/18; HGB increased to 8.2,  PLT at 616k, ANC at 11.0k, Potassium at 2.9, Alk Phos increased to 161, Total Bilirubin at 2.8 -LDH decreased from one month ago at 331 to today at 278 -Will see pt back in 2 months unless the pt develops any new concerns and I will see her sooner   #4 hypokalemia K 2.9 -Will refill Potassium, begin taking BID with orange juice, and pt completed her prednisone course so I expect her potassium levels to increase   #5. Low back pain -compression fracture L4 -MRI L spine -Acute/subacute superior endplate fracture at L4 with loss of height of 10-20%. No retropulsed bone. This is quite likely the cause of the low back pain. This was probably present on the radiographs of 01/20/2018, but not diagnosable because of the spinal curvature and unlevel endplates. Old healed superior endplate fracture at Y78 Plan -f/u with ortho Dr Percell Miller for further evaluation and maangement -Follow up with orthopedist regarding vertebral fracture -prn Tramadol for back pain   #6 Patient Active Problem List   Diagnosis Date Noted  . 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 Occult blood in stools 02/04/2018  . Ureteral calculus 11/12/2015  . Breast cancer screening, high risk patient 08/29/2015  . Acute delirium 08/07/2015  . Postoperative anemia due to acute blood loss 08/06/2015  . Protein-calorie malnutrition, severe 08/04/2015  . Closed intertrochanteric fracture of left femur ([place]) 08/03/2015  . Fall 08/03/2015  . Closed right hip fracture ([place]) 08/03/2015  . Hyperbilirubinemia 08/01/2013  . Hypokalemia 07/30/2013  . Vitamin B 12 deficiency 08/07/2012  . 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 Tobacco abuse 06/10/2012   -Continue follow-up with primary care physician for management of other medical comorbidities  -ALL TRANSFUSION PRODUCTS SHOULD BE PRE-WARMED USING A BLOOD WARMER INCLUDING IV FLUIDS, IF ANY.    RTC  with Dr Irene Limbo in 2 months with labs    . The total time spent in the appointment was 20 minutes and more than 50% was on counseling and direct patient cares.  Verbal consent has been given by the pt for a clinical observer, Mathis Fare, to be present.   Sullivan Lone  MD [place] AAHIVMS [facility] Hematology/Oncology Physician [facility]  (Office):       [phone] (Work cell):  [phone] (Fax):           [phone]  I, Baldwin [place], am acting as a scribe for Dr Irene Limbo.   .I have reviewed the above documentation for accuracy and completeness, and I agree with the above. Brunetta Genera MD

## 2018-04-17 ENCOUNTER — Telehealth: Payer: Self-pay | Admitting: Hematology

## 2018-04-17 ENCOUNTER — Inpatient Hospital Stay: Payer: Medicare Other | Attending: Hematology | Admitting: Hematology

## 2018-04-17 ENCOUNTER — Encounter: Payer: Self-pay | Admitting: Hematology

## 2018-04-17 ENCOUNTER — Inpatient Hospital Stay: Payer: Medicare Other

## 2018-04-17 VITALS — BP 142/44 | HR 71 | Temp 98.4°F | Resp 17 | Ht 60.0 in | Wt 91.0 lb

## 2018-04-17 DIAGNOSIS — D591 Autoimmune hemolytic anemia, unspecified: Secondary | ICD-10-CM

## 2018-04-17 DIAGNOSIS — M545 Low back pain: Secondary | ICD-10-CM | POA: Insufficient documentation

## 2018-04-17 DIAGNOSIS — E876 Hypokalemia: Secondary | ICD-10-CM | POA: Diagnosis not present

## 2018-04-17 DIAGNOSIS — N200 Calculus of kidney: Secondary | ICD-10-CM | POA: Insufficient documentation

## 2018-04-17 DIAGNOSIS — D5912 Cold autoimmune hemolytic anemia: Secondary | ICD-10-CM

## 2018-04-17 DIAGNOSIS — D472 Monoclonal gammopathy: Secondary | ICD-10-CM | POA: Diagnosis not present

## 2018-04-17 DIAGNOSIS — Z87891 Personal history of nicotine dependence: Secondary | ICD-10-CM | POA: Diagnosis not present

## 2018-04-17 DIAGNOSIS — J449 Chronic obstructive pulmonary disease, unspecified: Secondary | ICD-10-CM | POA: Insufficient documentation

## 2018-04-17 DIAGNOSIS — E43 Unspecified severe protein-calorie malnutrition: Secondary | ICD-10-CM

## 2018-04-17 DIAGNOSIS — E538 Deficiency of other specified B group vitamins: Secondary | ICD-10-CM

## 2018-04-17 LAB — CMP (CANCER CENTER ONLY)
ALBUMIN: 4.7 g/dL (ref 3.5–5.0)
ALK PHOS: 161 U/L — AB (ref 38–126)
ALT: 10 U/L (ref 0–44)
ANION GAP: 11 (ref 5–15)
AST: 17 U/L (ref 15–41)
BUN: 12 mg/dL (ref 8–23)
CO2: 30 mmol/L (ref 22–32)
CREATININE: 0.68 mg/dL (ref 0.44–1.00)
Calcium: 9.2 mg/dL (ref 8.9–10.3)
Chloride: 101 mmol/L (ref 98–111)
GFR, Estimated: >60 mL/min (ref >60)
GLUCOSE: 86 mg/dL (ref 70–99)
POTASSIUM: 2.9 mmol/L — AB (ref 3.5–5.1)
SODIUM: 142 mmol/L (ref 135–145)
TOTAL PROTEIN: 7 g/dL (ref 6.5–8.1)
Total Bilirubin: 2.8 mg/dL — ABNORMAL HIGH (ref 0.3–1.2)

## 2018-04-17 LAB — CBC WITH DIFFERENTIAL/PLATELET
BASOS PCT: 0 %
Basophils Absolute: 0.1 10*3/uL (ref 0.0–0.1)
EOS ABS: 0.1 10*3/uL (ref 0.0–0.5)
EOS PCT: 1 %
HCT: 23.8 % — ABNORMAL LOW (ref 34.8–46.6)
HEMOGLOBIN: 8.2 g/dL — AB (ref 11.6–15.9)
LYMPHS ABS: 2.3 10*3/uL (ref 0.9–3.3)
Lymphocytes Relative: 16 %
MCH: 38 pg — AB (ref 25.1–34.0)
MCHC: 34.5 g/dL (ref 31.5–36.0)
MCV: 110.2 fL — ABNORMAL HIGH (ref 79.5–101.0)
MONO ABS: 0.9 10*3/uL (ref 0.1–0.9)
MONOS PCT: 6 %
Neutro Abs: 11 10*3/uL — ABNORMAL HIGH (ref 1.5–6.5)
Neutrophils Relative %: 77 %
PLATELETS: 616 10*3/uL — AB (ref 145–400)
RBC: 2.16 MIL/uL — ABNORMAL LOW (ref 3.70–5.45)
RDW: 19.7 % — ABNORMAL HIGH (ref 11.2–14.5)
WBC: 14.4 10*3/uL — ABNORMAL HIGH (ref 3.9–10.3)

## 2018-04-17 LAB — RETICULOCYTES
RBC.: 2.16 MIL/uL — ABNORMAL LOW (ref 3.70–5.45)
Retic Count, Absolute: 185.8 10*3/uL — ABNORMAL HIGH (ref 33.7–90.7)
Retic Ct Pct: 8.6 % — ABNORMAL HIGH (ref 0.7–2.1)

## 2018-04-17 LAB — VITAMIN B12: VITAMIN B 12: 944 pg/mL — AB (ref 180–914)

## 2018-04-17 LAB — LACTATE DEHYDROGENASE: LDH: 278 U/L — ABNORMAL HIGH (ref 98–192)

## 2018-04-17 MED ORDER — POTASSIUM CHLORIDE CRYS ER 20 MEQ PO TBCR: EXTENDED_RELEASE_TABLET | ORAL | 0 refills | Status: DC

## 2018-04-17 NOTE — Telephone Encounter (Signed)
Appointments scheduled AVS/Calendar printed per 7/26 los °

## 2018-04-24 ENCOUNTER — Other Ambulatory Visit: Payer: Self-pay | Admitting: Hematology

## 2018-04-27 ENCOUNTER — Other Ambulatory Visit: Payer: Self-pay | Admitting: Hematology

## 2018-05-28 ENCOUNTER — Emergency Department (HOSPITAL_COMMUNITY): Payer: Medicare Other

## 2018-05-28 ENCOUNTER — Observation Stay (HOSPITAL_COMMUNITY)
Admission: EM | Admit: 2018-05-28 | Discharge: 2018-06-02 | Disposition: A | Discharge status: Home / Self Care | Payer: Medicare Other | Attending: Internal Medicine | Admitting: Internal Medicine

## 2018-05-28 ENCOUNTER — Other Ambulatory Visit: Payer: Self-pay

## 2018-05-28 ENCOUNTER — Encounter (HOSPITAL_COMMUNITY): Payer: Self-pay | Admitting: *Deleted

## 2018-05-28 DIAGNOSIS — S32050A Wedge compression fracture of fifth lumbar vertebra, initial encounter for closed fracture: Secondary | ICD-10-CM

## 2018-05-28 DIAGNOSIS — S32030A Wedge compression fracture of third lumbar vertebra, initial encounter for closed fracture: Secondary | ICD-10-CM

## 2018-05-28 DIAGNOSIS — K8020 Calculus of gallbladder without cholecystitis without obstruction: Secondary | ICD-10-CM | POA: Insufficient documentation

## 2018-05-28 DIAGNOSIS — J439 Emphysema, unspecified: Secondary | ICD-10-CM | POA: Diagnosis not present

## 2018-05-28 DIAGNOSIS — X58XXXA Exposure to other specified factors, initial encounter: Secondary | ICD-10-CM | POA: Diagnosis not present

## 2018-05-28 DIAGNOSIS — M48061 Spinal stenosis, lumbar region without neurogenic claudication: Secondary | ICD-10-CM | POA: Insufficient documentation

## 2018-05-28 DIAGNOSIS — M549 Dorsalgia, unspecified: Secondary | ICD-10-CM | POA: Diagnosis present

## 2018-05-28 DIAGNOSIS — D591 Autoimmune hemolytic anemia, unspecified: Secondary | ICD-10-CM | POA: Diagnosis present

## 2018-05-28 DIAGNOSIS — Z681 Body mass index (BMI) 19 or less, adult: Secondary | ICD-10-CM | POA: Diagnosis not present

## 2018-05-28 DIAGNOSIS — S32059A Unspecified fracture of fifth lumbar vertebra, initial encounter for closed fracture: Secondary | ICD-10-CM | POA: Diagnosis not present

## 2018-05-28 DIAGNOSIS — S2220XA Unspecified fracture of sternum, initial encounter for closed fracture: Secondary | ICD-10-CM | POA: Diagnosis not present

## 2018-05-28 DIAGNOSIS — Z87442 Personal history of urinary calculi: Secondary | ICD-10-CM | POA: Insufficient documentation

## 2018-05-28 DIAGNOSIS — G8929 Other chronic pain: Secondary | ICD-10-CM | POA: Insufficient documentation

## 2018-05-28 DIAGNOSIS — D472 Monoclonal gammopathy: Secondary | ICD-10-CM | POA: Diagnosis not present

## 2018-05-28 DIAGNOSIS — Z803 Family history of malignant neoplasm of breast: Secondary | ICD-10-CM | POA: Insufficient documentation

## 2018-05-28 DIAGNOSIS — E43 Unspecified severe protein-calorie malnutrition: Secondary | ICD-10-CM | POA: Diagnosis not present

## 2018-05-28 DIAGNOSIS — Z87891 Personal history of nicotine dependence: Secondary | ICD-10-CM | POA: Insufficient documentation

## 2018-05-28 DIAGNOSIS — Z9841 Cataract extraction status, right eye: Secondary | ICD-10-CM | POA: Insufficient documentation

## 2018-05-28 DIAGNOSIS — I700 Atherosclerosis of aorta: Secondary | ICD-10-CM | POA: Insufficient documentation

## 2018-05-28 DIAGNOSIS — J90 Pleural effusion, not elsewhere classified: Secondary | ICD-10-CM | POA: Diagnosis not present

## 2018-05-28 DIAGNOSIS — K5730 Diverticulosis of large intestine without perforation or abscess without bleeding: Secondary | ICD-10-CM | POA: Insufficient documentation

## 2018-05-28 DIAGNOSIS — E876 Hypokalemia: Secondary | ICD-10-CM | POA: Diagnosis not present

## 2018-05-28 DIAGNOSIS — E538 Deficiency of other specified B group vitamins: Secondary | ICD-10-CM | POA: Diagnosis not present

## 2018-05-28 DIAGNOSIS — S32049A Unspecified fracture of fourth lumbar vertebra, initial encounter for closed fracture: Secondary | ICD-10-CM | POA: Diagnosis not present

## 2018-05-28 DIAGNOSIS — Z90710 Acquired absence of both cervix and uterus: Secondary | ICD-10-CM | POA: Diagnosis not present

## 2018-05-28 DIAGNOSIS — R911 Solitary pulmonary nodule: Secondary | ICD-10-CM

## 2018-05-28 DIAGNOSIS — R0902 Hypoxemia: Secondary | ICD-10-CM | POA: Diagnosis not present

## 2018-05-28 DIAGNOSIS — S32040A Wedge compression fracture of fourth lumbar vertebra, initial encounter for closed fracture: Secondary | ICD-10-CM

## 2018-05-28 DIAGNOSIS — D649 Anemia, unspecified: Secondary | ICD-10-CM | POA: Diagnosis not present

## 2018-05-28 DIAGNOSIS — I6523 Occlusion and stenosis of bilateral carotid arteries: Secondary | ICD-10-CM | POA: Diagnosis not present

## 2018-05-28 DIAGNOSIS — D5912 Cold autoimmune hemolytic anemia: Secondary | ICD-10-CM | POA: Diagnosis present

## 2018-05-28 DIAGNOSIS — S22080A Wedge compression fracture of T11-T12 vertebra, initial encounter for closed fracture: Secondary | ICD-10-CM | POA: Diagnosis present

## 2018-05-28 DIAGNOSIS — Z91013 Allergy to seafood: Secondary | ICD-10-CM | POA: Insufficient documentation

## 2018-05-28 DIAGNOSIS — S32039A Unspecified fracture of third lumbar vertebra, initial encounter for closed fracture: Secondary | ICD-10-CM | POA: Diagnosis not present

## 2018-05-28 DIAGNOSIS — Z79899 Other long term (current) drug therapy: Secondary | ICD-10-CM | POA: Insufficient documentation

## 2018-05-28 LAB — COMPREHENSIVE METABOLIC PANEL
ALT: 10 U/L (ref 0–44)
AST: 21 U/L (ref 15–41)
Albumin: 3.7 g/dL (ref 3.5–5.0)
Alkaline Phosphatase: 101 U/L (ref 38–126)
Anion gap: 12 (ref 5–15)
BUN: 25 mg/dL — ABNORMAL HIGH (ref 8–23)
CO2: 31 mmol/L (ref 22–32)
Calcium: 8.6 mg/dL — ABNORMAL LOW (ref 8.9–10.3)
Chloride: 96 mmol/L — ABNORMAL LOW (ref 98–111)
Creatinine, Ser: 0.76 mg/dL (ref 0.44–1.00)
GFR calc Af Amer: >60 mL/min (ref >60)
GFR calc non Af Amer: >60 mL/min (ref >60)
Glucose, Bld: 117 mg/dL — ABNORMAL HIGH (ref 70–99)
Potassium: 4 mmol/L (ref 3.5–5.1)
Sodium: 139 mmol/L (ref 135–145)
Total Bilirubin: 4.4 mg/dL — ABNORMAL HIGH (ref 0.3–1.2)
Total Protein: 6.6 g/dL (ref 6.5–8.1)

## 2018-05-28 LAB — URINALYSIS, ROUTINE W REFLEX MICROSCOPIC
BACTERIA UA: NONE SEEN
Bilirubin Urine: NEGATIVE
Glucose, UA: NEGATIVE mg/dL
KETONES UR: NEGATIVE mg/dL
LEUKOCYTES UA: NEGATIVE
Nitrite: NEGATIVE
PROTEIN: NEGATIVE mg/dL
Specific Gravity, Urine: >1.046 — ABNORMAL HIGH (ref 1.005–1.030)
pH: 6 (ref 5.0–8.0)

## 2018-05-28 LAB — CBC WITH DIFFERENTIAL/PLATELET
Basophils Absolute: 0 10*3/uL (ref 0.0–0.1)
Basophils Relative: 0 %
Eosinophils Absolute: 0 10*3/uL (ref 0.0–0.7)
Eosinophils Relative: 0 %
HCT: 14.6 % — ABNORMAL LOW (ref 36.0–46.0)
Hemoglobin: 4.8 g/dL — CL (ref 12.0–15.0)
Lymphocytes Relative: 18 %
Lymphs Abs: 1.9 10*3/uL (ref 0.7–4.0)
MCH: 35.8 pg — ABNORMAL HIGH (ref 26.0–34.0)
MCHC: 32.9 g/dL (ref 30.0–36.0)
MCV: 109 fL — ABNORMAL HIGH (ref 78.0–100.0)
Monocytes Absolute: 1.1 10*3/uL — ABNORMAL HIGH (ref 0.1–1.0)
Monocytes Relative: 11 %
Neutro Abs: 7.4 10*3/uL (ref 1.7–7.7)
Neutrophils Relative %: 71 %
Platelets: 612 10*3/uL — ABNORMAL HIGH (ref 150–400)
RBC: 1.34 MIL/uL — ABNORMAL LOW (ref 3.87–5.11)
RDW: 19.5 % — ABNORMAL HIGH (ref 11.5–15.5)
WBC: 10.4 10*3/uL (ref 4.0–10.5)

## 2018-05-28 LAB — I-STAT TROPONIN, ED: Troponin i, poc: 0.03 ng/mL (ref 0.00–0.08)

## 2018-05-28 LAB — RETICULOCYTES
RBC.: 1.23 MIL/uL — ABNORMAL LOW (ref 3.87–5.11)
RETIC COUNT ABSOLUTE: 175.9 10*3/uL (ref 19.0–186.0)
Retic Ct Pct: 14.3 % — ABNORMAL HIGH (ref 0.4–3.1)

## 2018-05-28 LAB — LACTATE DEHYDROGENASE: LDH: 898 U/L — AB (ref 98–192)

## 2018-05-28 LAB — PREPARE RBC (CROSSMATCH)

## 2018-05-28 MED ORDER — SODIUM CHLORIDE 0.9% FLUSH
3.0000 mL | Freq: Two times a day (BID) | INTRAVENOUS | Status: DC
  Administered 2018-05-29 – 2018-06-02 (×9): 3 mL via INTRAVENOUS

## 2018-05-28 MED ORDER — TRAZODONE HCL 50 MG PO TABS: 50.0000 mg | ORAL_TABLET | Freq: Every evening | ORAL | Status: DC | PRN

## 2018-05-28 MED ORDER — MORPHINE SULFATE (PF) 4 MG/ML IV SOLN
4.0000 mg | Freq: Once | INTRAVENOUS | Status: AC
  Administered 2018-05-28: 4 mg via INTRAVENOUS
  Filled 2018-05-28: qty 1

## 2018-05-28 MED ORDER — IOPAMIDOL (ISOVUE-370) INJECTION 76%
INTRAVENOUS | Status: AC
  Filled 2018-05-28: qty 100

## 2018-05-28 MED ORDER — SODIUM CHLORIDE 0.9 % IV SOLN
1.0000 g | Freq: Two times a day (BID) | INTRAVENOUS | Status: DC
  Filled 2018-05-28: qty 1

## 2018-05-28 MED ORDER — POLYETHYLENE GLYCOL 3350 17 G PO PACK: 17.0000 g | PACK | Freq: Every day | ORAL | Status: DC | PRN

## 2018-05-28 MED ORDER — SODIUM CHLORIDE 0.9 % IV SOLN: 250.0000 mL | INTRAVENOUS | Status: DC | PRN

## 2018-05-28 MED ORDER — ONDANSETRON HCL 4 MG/2ML IJ SOLN: 4.0000 mg | Freq: Four times a day (QID) | INTRAMUSCULAR | Status: DC | PRN

## 2018-05-28 MED ORDER — ADULT MULTIVITAMIN W/MINERALS CH
1.0000 | ORAL_TABLET | Freq: Every day | ORAL | Status: DC
  Administered 2018-05-29 – 2018-06-02 (×6): 1 via ORAL
  Filled 2018-05-28 (×6): qty 1

## 2018-05-28 MED ORDER — SODIUM CHLORIDE 0.9% FLUSH: 3.0000 mL | INTRAVENOUS | Status: DC | PRN

## 2018-05-28 MED ORDER — MORPHINE SULFATE (PF) 2 MG/ML IV SOLN
2.0000 mg | Freq: Once | INTRAVENOUS | Status: AC
  Administered 2018-05-28: 2 mg via INTRAVENOUS
  Filled 2018-05-28: qty 1

## 2018-05-28 MED ORDER — ACETAMINOPHEN 325 MG PO TABS: 650.0000 mg | ORAL_TABLET | Freq: Four times a day (QID) | ORAL | Status: DC | PRN

## 2018-05-28 MED ORDER — ONDANSETRON HCL 4 MG PO TABS: 4.0000 mg | ORAL_TABLET | Freq: Four times a day (QID) | ORAL | Status: DC | PRN

## 2018-05-28 MED ORDER — SENNOSIDES-DOCUSATE SODIUM 8.6-50 MG PO TABS
2.0000 | ORAL_TABLET | Freq: Two times a day (BID) | ORAL | Status: DC
  Administered 2018-05-29 (×3): 2 via ORAL
  Administered 2018-05-31: 1 via ORAL
  Administered 2018-06-01 – 2018-06-02 (×3): 2 via ORAL
  Filled 2018-05-28 (×8): qty 2

## 2018-05-28 MED ORDER — FOLIC ACID 1 MG PO TABS: 1.0000 mg | ORAL_TABLET | Freq: Every day | ORAL | Status: DC

## 2018-05-28 MED ORDER — ACETAMINOPHEN 325 MG PO TABS
650.0000 mg | ORAL_TABLET | Freq: Four times a day (QID) | ORAL | Status: DC | PRN
  Administered 2018-05-30 – 2018-06-02 (×4): 650 mg via ORAL
  Filled 2018-05-28 (×4): qty 2

## 2018-05-28 MED ORDER — ALBUTEROL SULFATE (2.5 MG/3ML) 0.083% IN NEBU: 2.5000 mg | INHALATION_SOLUTION | RESPIRATORY_TRACT | Status: DC | PRN

## 2018-05-28 MED ORDER — IOPAMIDOL (ISOVUE-370) INJECTION 76%
100.0000 mL | Freq: Once | INTRAVENOUS | Status: AC | PRN
  Administered 2018-05-28: 100 mL via INTRAVENOUS

## 2018-05-28 MED ORDER — ADULT MULTIVITAMIN W/MINERALS CH: 1.0000 | ORAL_TABLET | Freq: Every day | ORAL | Status: DC

## 2018-05-28 MED ORDER — ACETAMINOPHEN 650 MG RE SUPP: 650.0000 mg | Freq: Four times a day (QID) | RECTAL | Status: DC | PRN

## 2018-05-28 MED ORDER — FOLIC ACID 1 MG PO TABS
1.0000 mg | ORAL_TABLET | Freq: Every day | ORAL | Status: DC
  Administered 2018-05-29 – 2018-06-02 (×6): 1 mg via ORAL
  Filled 2018-05-28 (×6): qty 1

## 2018-05-28 MED ORDER — SODIUM CHLORIDE 0.9 % IV SOLN
10.0000 mL/h | Freq: Once | INTRAVENOUS | Status: AC
  Administered 2018-05-28: 10 mL/h via INTRAVENOUS

## 2018-05-28 NOTE — H&P (Signed)
Patient Demographics:    [name], is a 77 y.o. 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  Goal is to get hemoglobin above 7.  Hemolysis work-up including LDH,  haptoglobin, reticulocyte count pending  2)Vertebral Fractures-  acute T12 compression fracture, and subacute fractures of L3-L4 and L5--- orthopedic consult from Dr. Berenice Primas from [place] requested, he advised IR consult for kyphoplasty, Dr. Berenice Primas will see patient sometime within the next 24 hours and give his orthopedic opinion, pain control for now, hold off on PT/OT eval until patient is seen and cleared by Ortho/neurosurgery.  Patient may need a brace, ????  If candidate for kyphoplasty due to pain issues , IR consult ordered  With History of - Reviewed by me  Past Medical History:  Diagnosis Date  . B12 deficiency   . Bilateral carotid artery stenosis    right CCA and ICA 1-39%/    left ICA 40-59%  per last duplex  10-19-2015  . COPD (chronic obstructive pulmonary disease) ([place])   . Diverticulosis of colon   . History of diverticulitis of colon    2004  . 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 Renal and ureteric calculus    left      Past Surgical  History:  Procedure Laterality Date  . ABDOMINAL HYSTERECTOMY  1986  . APPENDECTOMY  1967  . BREAST EXCISIONAL BIOPSY Right 1984  . CATARACT EXTRACTION W/ INTRAOCULAR LENS IMPLANT Right 1990's   congential cataract  . CYSTOSCOPY W/ URETERAL STENT PLACEMENT  11/12/2015   Procedure: CYSTOSCOPY WITH RETROGRADE PYELOGRAM/URETERAL STENT PLACEMENT;  Surgeon: [name], MD;  Location: [facility];  Service: Urology;;  . Consuela Mimes W/ URETERAL STENT REMOVAL Left 11/22/2015   Procedure: CYSTOSCOPY WITH STENT REMOVAL;  Surgeon: [name], MD;  [facility];  Service: Urology;  Laterality: Left;  . CYSTOSCOPY/URETEROSCOPY/HOLMIUM LASER Left 11/22/2015   Procedure: CYSTOSCOPY/URETEROSCOPY/HOLMIUM LASER;  Surgeon: [name], MD;  Location: [facility];  Service: Urology;  Laterality: Left;  FLEX URETEROSCOPY  . FEMUR IM NAIL Left 08/04/2015   Procedure: INTRAMEDULLARY (IM) NAIL FEMORAL;  Surgeon: [name], MD;  Location: [place];  Service: Orthopedics;  Laterality: Left;  . TONSILLECTOMY  age 10    Chief Complaint  Patient presents with  . Back Pain      HPI:  [name]  is a 78 y.o. 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  Initially patient thought she may have had a fever couple days ago on further questioning both patient and mother and sister all deny fevers lately  No vomiting, no diarrhea, no sick contacts, no productive cough, no hematochezia, no melena  Patient had epidural steroid shots last week for back pain,   In ED... Hemoglobin is found to be 4.8, no obvious bleeding,  Chest and abdominal imaging without acute findings,  CT of the lumbar spine with acute T12 compression  fracture, and subacute fractures of L3-L4 and L5  Case discussed with patient's oncologist/hematologist Dr. Irene Limbo who recommends transfusion of 2 units of packed cells with the warmer, Hemolysis labs ordered  Additional history obtained from patient is a 41 year old mother and patient's sister at bedside     Review of systems:    In addition to the HPI above,   A full Review of  Systems was done, all other systems reviewed are negative except as noted above in HPI , .    Social History:  Reviewed by me    Social History   Tobacco Use  . Smoking status: Former Smoker    Packs/day: 1.00    Types: Cigarettes    Last attempt to quit: 08/03/2015    Years since quitting: 2.8  . Smokeless tobacco: Never Used  Substance Use Topics  . Alcohol use: Yes    Comment: occ     Family History :  Reviewed by me    Family History  Problem Relation Age of Onset  . Breast cancer Cousin      Home Medications:   Prior to Admission medications   Medication Sig Start Date End Date Taking? Authorizing Provider  acetaminophen (TYLENOL) 325 MG tablet Take 2 tablets (650 mg total) by mouth every 6 (six) hours as needed for mild pain (or Fever >/= 101). 08/08/15  Yes Delfina Redwood, MD  Cyanocobalamin ([facility] B-12) 1000 MCG TBCR TAKE 1 TABLET DAILY 05/22/17  Yes Brunetta Genera, MD  docusate sodium (COLACE) 100 MG capsule Take 1 capsule (100 mg total) by mouth daily. 08/08/15  Yes Delfina Redwood, MD  folic acid (FOLVITE) 1 MG tablet Take 1 tablet (1 mg total) by mouth daily. 05/22/17  Yes Brunetta Genera, MD  Tetrahydrozoline HCl (VISINE OP) Apply 1 drop to eye daily as needed (allergies/dry eyes).   Yes [provider]  potassium chloride SA (K-DUR,KLOR-CON) 20 MEQ tablet 40 meq PO twice daily for 3 days then 51meq po daily. Please repeat potassium levels with PCP in 1 month to address additional potassium. Patient not taking: Reported on 05/28/2018 04/17/18   Brunetta Genera, MD  traMADol (ULTRAM) 50 MG tablet Take 1 tablet (50 mg total) by mouth every 8 (eight) hours as needed. Patient not taking: Reported on 05/28/2018 02/18/18   Brunetta Genera, MD     Allergies:     Allergies  Allergen Reactions  . Other Other (See Comments)    "Most antibiotics give yeast infection"  . Shellfish Allergy Hives     Physical Exam:   Vitals  Blood pressure (!) 107/42, pulse 79, temperature (!) 97.5 F (36.4 C), temperature source Oral, resp. rate 18, height 5' (1.524 m),  weight 42.2 kg, SpO2 100 %.  Physical Examination: General appearance - alert, chronically ill and pale appearing, and in no distress  Mental status - alert, oriented to person, place, and time,  Eyes - sclera pale Neck - supple, no JVD elevation , Chest - clear  to auscultation bilaterally, symmetrical air movement,  Heart - S1 and S2 normal, regular Abdomen - soft, nontender, nondistended, no masses or organomegaly Neurological - screening mental status exam normal, neck supple without rigidity, cranial nerves II through XII intact, DTR's normal and symmetric Extremities - no pedal edema noted, intact peripheral pulses  Skin - warm, dry, pale MSK-thoracic and lumbar area pain with range of motion    Data Review:    CBC Recent Labs  Lab 05/28/18 1136  WBC 10.4  HGB 4.8*  HCT 14.6*  PLT 612*  MCV 109.0*  MCH 35.8*  MCHC 32.9  RDW 19.5*  LYMPHSABS 1.9  MONOABS 1.1*  EOSABS 0.0  BASOSABS 0.0   ------------------------------------------------------------------------------------------------------------------  Chemistries  Recent Labs  Lab 05/28/18 1136  NA 139  K 4.0  CL 96*  CO2 31  GLUCOSE 117*  BUN 25*  CREATININE 0.76  CALCIUM 8.6*  AST 21  ALT 10  ALKPHOS 101  BILITOT 4.4*   ------------------------------------------------------------------------------------------------------------------ estimated creatinine clearance is 39.9 mL/min (by C-G formula  based on SCr of 0.76 mg/dL). ------------------------------------------------------------------------------------------------------------------ No results for input(s): TSH, T4TOTAL, T3FREE, THYROIDAB in the last 72 hours.  Invalid input(s): FREET3   Coagulation profile No results for input(s): INR, PROTIME in the last 168 hours. ------------------------------------------------------------------------------------------------------------------- No results for input(s): DDIMER in the last 72 hours. -------------------------------------------------------------------------------------------------------------------  Cardiac Enzymes No results for input(s): CKMB, TROPONINI, MYOGLOBIN in the last 168 hours.  Invalid input(s): CK ------------------------------------------------------------------------------------------------------------------ No results found for: BNP   ---------------------------------------------------------------------------------------------------------------  Urinalysis    Component Value Date/Time   COLORURINE YELLOW 02/04/2018 1800   APPEARANCEUR CLEAR 02/04/2018 1800   LABSPEC 1.005 02/04/2018 1800   PHURINE 6.0 02/04/2018 1800   GLUCOSEU NEGATIVE 02/04/2018 1800   HGBUR SMALL (A) 02/04/2018 1800   BILIRUBINUR NEGATIVE 02/04/2018 1800   KETONESUR NEGATIVE 02/04/2018 1800   PROTEINUR NEGATIVE 02/04/2018 1800   NITRITE NEGATIVE 02/04/2018 1800   LEUKOCYTESUR NEGATIVE 02/04/2018 1800    ----------------------------------------------------------------------------------------------------------------   Imaging Results:    Dg Chest 2 View  Result Date: 05/28/2018 CLINICAL DATA:  Hypoxia. EXAM: CHEST - 2 VIEW COMPARISON:  06/12/2016 and prior radiographs FINDINGS: UPPER limits normal heart size again noted. Mild peribronchial thickening is unchanged. LEFT basilar noted. No definite pleural effusion or pneumothorax. No acute bony abnormalities are present.  IMPRESSION: LEFT basilar opacity which may represent airspace disease or atelectasis. Radiographic follow-up to resolution recommended. Electronically Signed   By: Margarette Canada M.D.   On: 05/28/2018 12:49   Ct Angio Chest Pe W And/or Wo Contrast  Result Date: 05/28/2018 CLINICAL DATA:  "Pt complains of lower back pain and left hip pain. Pt states pain has been worse since receiving and epidural a week ago " EXAM: CT ANGIOGRAPHY CHEST CT ABDOMEN AND PELVIS WITH CONTRAST TECHNIQUE: Multidetector CT imaging of the chest was performed using the standard protocol during bolus administration of intravenous contrast. Multiplanar CT image reconstructions and MIPs were obtained to evaluate the vascular anatomy. Multidetector CT imaging of the abdomen and pelvis was performed using the standard protocol during bolus administration of intravenous contrast. CONTRAST:  120mL ISOVUE-370 IOPAMIDOL (ISOVUE-370) INJECTION 76% COMPARISON:  02/04/2018 FINDINGS: CTA CHEST FINDINGS Cardiovascular: The heart size is UPPER  normal. No significant coronary artery calcifications. No pericardial effusion. There is atherosclerotic calcification of the thoracic aorta not associated with aneurysm. The appearance of the pulmonary arteries is unremarkable. The arteries are well opacified and there is no acute pulmonary embolus. Mediastinum/Nodes: The visualized portion of the thyroid gland has a normal appearance. The esophagus is normal in appearance. No mediastinal, hilar, or axillary adenopathy. Lungs/Pleura: There are small bilateral pleural effusions. There is bibasilar atelectasis. Scattered emphysematous changes are identified primarily in the UPPER lobes. Airways are patent. No focal consolidations. Within the RIGHT UPPER lobe there is a spiculated 6 millimeter mass best seen on image 47 of series 4 a 4 millimeter nodule is identified in the RIGHT UPPER lobe on image 64 of series 4. Musculoskeletal: Remote sternal fracture. Degenerative  changes are identified in the thoracic spine. Acute compression fracture of T12. Review of the MIP images confirms the above findings. CT ABDOMEN and PELVIS FINDINGS Hepatobiliary: The liver is homogeneous. There is mild periportal edema. No intra or extrahepatic biliary duct dilatation. Gallbladder contains several calcified stones. No pericholecystic fluid. Pancreas: Atrophic and otherwise normal appearance of the pancreas. Spleen: Normal in size without focal abnormality. Adrenals/Urinary Tract: Adrenal glands are normal in appearance. There is symmetric enhancement and excretion of the kidneys. The bladder and visualized portion of the urethra are normal. Stomach/Bowel: The stomach and bowel loops are normal in appearance. There are numerous colonic diverticula. There is thickening of the sigmoid segment which may be related to chronic diverticulosis. No associated inflammatory changes to indicate presence of acute diverticulitis. Vascular/Lymphatic: There is atherosclerotic calcification of the abdominal aorta not associated with aneurysm. Although involved by atherosclerosis, there is vascular opacification of the celiac axis, superior mesenteric artery, and inferior mesenteric artery. Normal appearance of the portal venous system and inferior vena cava. No retroperitoneal or mesenteric adenopathy. CT Reproductive: The uterus is surgically absent.  No adnexal mass. Other: No free pelvic fluid. Anterior abdominal wall is unremarkable. Musculoskeletal: Acute superior endplate fracture of H29. Chronic anterior wedge deformities of T11. Subacute compression fractures of L3, L4, and L5. Remote ORIF of the LEFT hip. No joint effusion or evidence for dislocation. Review of the MIP images confirms the above findings. IMPRESSION: 1. Acute fracture of T12. 2. No acute pulmonary embolus. 3. Spiculated pulmonary nodule in the RIGHT UPPER lobe measuring 6 millimeters. Morphology is suspicious and further evaluation is  recommended. Recommend noncontrast CT of the chest in 3 months. 4.  Emphysema (ICD10-J43.9). 5. Subacute or chronic fractures of T11, L3, L4, and L5. Remote sternal fracture. 6. Mild periportal edema of uncertain etiology. 7. Colonic diverticulosis. Thickening of the sigmoid segment of the colon may be related to chronic diverticulosis. There is no evidence for acute inflammatory change. 8. Hysterectomy. These results were called by telephone at the time of interpretation on 05/28/2018 at 3:15 pm to Dr. Rodell Perna , who verbally acknowledged these results. Electronically Signed   By: Nolon Nations M.D.   On: 05/28/2018 15:17   Ct Abdomen Pelvis W Contrast  Result Date: 05/28/2018 CLINICAL DATA:  "Pt complains of lower back pain and left hip pain. Pt states pain has been worse since receiving and epidural a week ago " EXAM: CT ANGIOGRAPHY CHEST CT ABDOMEN AND PELVIS WITH CONTRAST TECHNIQUE: Multidetector CT imaging of the chest was performed using the standard protocol during bolus administration of intravenous contrast. Multiplanar CT image reconstructions and MIPs were obtained to evaluate the vascular anatomy. Multidetector CT imaging of the abdomen and  pelvis was performed using the standard protocol during bolus administration of intravenous contrast. CONTRAST:  193mL ISOVUE-370 IOPAMIDOL (ISOVUE-370) INJECTION 76% COMPARISON:  02/04/2018 FINDINGS: CTA CHEST FINDINGS Cardiovascular: The heart size is UPPER normal. No significant coronary artery calcifications. No pericardial effusion. There is atherosclerotic calcification of the thoracic aorta not associated with aneurysm. The appearance of the pulmonary arteries is unremarkable. The arteries are well opacified and there is no acute pulmonary embolus. Mediastinum/Nodes: The visualized portion of the thyroid gland has a normal appearance. The esophagus is normal in appearance. No mediastinal, hilar, or axillary adenopathy. Lungs/Pleura: There are small  bilateral pleural effusions. There is bibasilar atelectasis. Scattered emphysematous changes are identified primarily in the UPPER lobes. Airways are patent. No focal consolidations. Within the RIGHT UPPER lobe there is a spiculated 6 millimeter mass best seen on image 47 of series 4 a 4 millimeter nodule is identified in the RIGHT UPPER lobe on image 64 of series 4. Musculoskeletal: Remote sternal fracture. Degenerative changes are identified in the thoracic spine. Acute compression fracture of T12. Review of the MIP images confirms the above findings. CT ABDOMEN and PELVIS FINDINGS Hepatobiliary: The liver is homogeneous. There is mild periportal edema. No intra or extrahepatic biliary duct dilatation. Gallbladder contains several calcified stones. No pericholecystic fluid. Pancreas: Atrophic and otherwise normal appearance of the pancreas. Spleen: Normal in size without focal abnormality. Adrenals/Urinary Tract: Adrenal glands are normal in appearance. There is symmetric enhancement and excretion of the kidneys. The bladder and visualized portion of the urethra are normal. Stomach/Bowel: The stomach and bowel loops are normal in appearance. There are numerous colonic diverticula. There is thickening of the sigmoid segment which may be related to chronic diverticulosis. No associated inflammatory changes to indicate presence of acute diverticulitis. Vascular/Lymphatic: There is atherosclerotic calcification of the abdominal aorta not associated with aneurysm. Although involved by atherosclerosis, there is vascular opacification of the celiac axis, superior mesenteric artery, and inferior mesenteric artery. Normal appearance of the portal venous system and inferior vena cava. No retroperitoneal or mesenteric adenopathy. CT Reproductive: The uterus is surgically absent.  No adnexal mass. Other: No free pelvic fluid. Anterior abdominal wall is unremarkable. Musculoskeletal: Acute superior endplate fracture of R48.  Chronic anterior wedge deformities of T11. Subacute compression fractures of L3, L4, and L5. Remote ORIF of the LEFT hip. No joint effusion or evidence for dislocation. Review of the MIP images confirms the above findings. IMPRESSION: 1. Acute fracture of T12. 2. No acute pulmonary embolus. 3. Spiculated pulmonary nodule in the RIGHT UPPER lobe measuring 6 millimeters. Morphology is suspicious and further evaluation is recommended. Recommend noncontrast CT of the chest in 3 months. 4.  Emphysema (ICD10-J43.9). 5. Subacute or chronic fractures of T11, L3, L4, and L5. Remote sternal fracture. 6. Mild periportal edema of uncertain etiology. 7. Colonic diverticulosis. Thickening of the sigmoid segment of the colon may be related to chronic diverticulosis. There is no evidence for acute inflammatory change. 8. Hysterectomy. These results were called by telephone at the time of interpretation on 05/28/2018 at 3:15 pm to Dr. Rodell Perna , who verbally acknowledged these results. Electronically Signed   By: Nolon Nations M.D.   On: 05/28/2018 15:17   Ct L-spine No Charge  Result Date: 05/28/2018 CLINICAL DATA:  Low back pain EXAM: CT LUMBAR SPINE WITHOUT CONTRAST TECHNIQUE: Multidetector CT imaging of the lumbar spine was performed without intravenous contrast administration. Multiplanar CT image reconstructions were also generated. COMPARISON:  Lumbar MRI 01/29/2018 FINDINGS: Segmentation: Normal Alignment: Normal  Vertebrae: Chronic compression fracture T11 unchanged -Mild fracture superior endplate of E70 appears acute -Inferior endplate compression fracture of L3 is new since the prior study. Sclerotic bone changes is present suggesting healing from recent fracture -Moderately severe compression fracture of L4 has progressed since the prior study with evidence of bone healing -Moderate compression fracture L5 is new since the prior MRI and shows evidence of healing with sclerotic changes. Paraspinal and other soft  tissues: Bibasilar atelectasis. Atherosclerotic aorta. No paraspinous soft tissue swelling. Disc levels: T12-L1: Negative L1-2: Negative L2-3: Mild disc bulging and mild facet degeneration without significant stenosis L3-4: Disc degeneration and disc bulging with spurring. Bilateral mild facet degeneration. No significant stenosis L4-5: Disc degeneration with disc bulging and endplate spurring. Mild facet degeneration. No significant spinal stenosis L5-S1: Mild disc degeneration without stenosis. IMPRESSION: Acute compression fracture of T12 Subacute compression fractures of L3, L4, and L5. Chronic fracture of T11 Multilevel degenerative changes in the lumbar spine without significant stenosis. Atherosclerotic aorta Electronically Signed   By: Franchot Gallo M.D.   On: 05/28/2018 14:43    Radiological Exams on Admission: Dg Chest 2 View  Result Date: 05/28/2018 CLINICAL DATA:  Hypoxia. EXAM: CHEST - 2 VIEW COMPARISON:  06/12/2016 and prior radiographs FINDINGS: UPPER limits normal heart size again noted. Mild peribronchial thickening is unchanged. LEFT basilar noted. No definite pleural effusion or pneumothorax. No acute bony abnormalities are present. IMPRESSION: LEFT basilar opacity which may represent airspace disease or atelectasis. Radiographic follow-up to resolution recommended. Electronically Signed   By: Margarette Canada M.D.   On: 05/28/2018 12:49   Ct Angio Chest Pe W And/or Wo Contrast  Result Date: 05/28/2018 CLINICAL DATA:  "Pt complains of lower back pain and left hip pain. Pt states pain has been worse since receiving and epidural a week ago " EXAM: CT ANGIOGRAPHY CHEST CT ABDOMEN AND PELVIS WITH CONTRAST TECHNIQUE: Multidetector CT imaging of the chest was performed using the standard protocol during bolus administration of intravenous contrast. Multiplanar CT image reconstructions and MIPs were obtained to evaluate the vascular anatomy. Multidetector CT imaging of the abdomen and pelvis was  performed using the standard protocol during bolus administration of intravenous contrast. CONTRAST:  144mL ISOVUE-370 IOPAMIDOL (ISOVUE-370) INJECTION 76% COMPARISON:  02/04/2018 FINDINGS: CTA CHEST FINDINGS Cardiovascular: The heart size is UPPER normal. No significant coronary artery calcifications. No pericardial effusion. There is atherosclerotic calcification of the thoracic aorta not associated with aneurysm. The appearance of the pulmonary arteries is unremarkable. The arteries are well opacified and there is no acute pulmonary embolus. Mediastinum/Nodes: The visualized portion of the thyroid gland has a normal appearance. The esophagus is normal in appearance. No mediastinal, hilar, or axillary adenopathy. Lungs/Pleura: There are small bilateral pleural effusions. There is bibasilar atelectasis. Scattered emphysematous changes are identified primarily in the UPPER lobes. Airways are patent. No focal consolidations. Within the RIGHT UPPER lobe there is a spiculated 6 millimeter mass best seen on image 47 of series 4 a 4 millimeter nodule is identified in the RIGHT UPPER lobe on image 64 of series 4. Musculoskeletal: Remote sternal fracture. Degenerative changes are identified in the thoracic spine. Acute compression fracture of T12. Review of the MIP images confirms the above findings. CT ABDOMEN and PELVIS FINDINGS Hepatobiliary: The liver is homogeneous. There is mild periportal edema. No intra or extrahepatic biliary duct dilatation. Gallbladder contains several calcified stones. No pericholecystic fluid. Pancreas: Atrophic and otherwise normal appearance of the pancreas. Spleen: Normal in size without focal abnormality. Adrenals/Urinary Tract:  Adrenal glands are normal in appearance. There is symmetric enhancement and excretion of the kidneys. The bladder and visualized portion of the urethra are normal. Stomach/Bowel: The stomach and bowel loops are normal in appearance. There are numerous colonic  diverticula. There is thickening of the sigmoid segment which may be related to chronic diverticulosis. No associated inflammatory changes to indicate presence of acute diverticulitis. Vascular/Lymphatic: There is atherosclerotic calcification of the abdominal aorta not associated with aneurysm. Although involved by atherosclerosis, there is vascular opacification of the celiac axis, superior mesenteric artery, and inferior mesenteric artery. Normal appearance of the portal venous system and inferior vena cava. No retroperitoneal or mesenteric adenopathy. CT Reproductive: The uterus is surgically absent.  No adnexal mass. Other: No free pelvic fluid. Anterior abdominal wall is unremarkable. Musculoskeletal: Acute superior endplate fracture of H70. Chronic anterior wedge deformities of T11. Subacute compression fractures of L3, L4, and L5. Remote ORIF of the LEFT hip. No joint effusion or evidence for dislocation. Review of the MIP images confirms the above findings. IMPRESSION: 1. Acute fracture of T12. 2. No acute pulmonary embolus. 3. Spiculated pulmonary nodule in the RIGHT UPPER lobe measuring 6 millimeters. Morphology is suspicious and further evaluation is recommended. Recommend noncontrast CT of the chest in 3 months. 4.  Emphysema (ICD10-J43.9). 5. Subacute or chronic fractures of T11, L3, L4, and L5. Remote sternal fracture. 6. Mild periportal edema of uncertain etiology. 7. Colonic diverticulosis. Thickening of the sigmoid segment of the colon may be related to chronic diverticulosis. There is no evidence for acute inflammatory change. 8. Hysterectomy. These results were called by telephone at the time of interpretation on 05/28/2018 at 3:15 pm to Dr. Rodell Perna , who verbally acknowledged these results. Electronically Signed   By: Nolon Nations M.D.   On: 05/28/2018 15:17   Ct Abdomen Pelvis W Contrast  Result Date: 05/28/2018 CLINICAL DATA:  "Pt complains of lower back pain and left hip pain. Pt  states pain has been worse since receiving and epidural a week ago " EXAM: CT ANGIOGRAPHY CHEST CT ABDOMEN AND PELVIS WITH CONTRAST TECHNIQUE: Multidetector CT imaging of the chest was performed using the standard protocol during bolus administration of intravenous contrast. Multiplanar CT image reconstructions and MIPs were obtained to evaluate the vascular anatomy. Multidetector CT imaging of the abdomen and pelvis was performed using the standard protocol during bolus administration of intravenous contrast. CONTRAST:  114mL ISOVUE-370 IOPAMIDOL (ISOVUE-370) INJECTION 76% COMPARISON:  02/04/2018 FINDINGS: CTA CHEST FINDINGS Cardiovascular: The heart size is UPPER normal. No significant coronary artery calcifications. No pericardial effusion. There is atherosclerotic calcification of the thoracic aorta not associated with aneurysm. The appearance of the pulmonary arteries is unremarkable. The arteries are well opacified and there is no acute pulmonary embolus. Mediastinum/Nodes: The visualized portion of the thyroid gland has a normal appearance. The esophagus is normal in appearance. No mediastinal, hilar, or axillary adenopathy. Lungs/Pleura: There are small bilateral pleural effusions. There is bibasilar atelectasis. Scattered emphysematous changes are identified primarily in the UPPER lobes. Airways are patent. No focal consolidations. Within the RIGHT UPPER lobe there is a spiculated 6 millimeter mass best seen on image 47 of series 4 a 4 millimeter nodule is identified in the RIGHT UPPER lobe on image 64 of series 4. Musculoskeletal: Remote sternal fracture. Degenerative changes are identified in the thoracic spine. Acute compression fracture of T12. Review of the MIP images confirms the above findings. CT ABDOMEN and PELVIS FINDINGS Hepatobiliary: The liver is homogeneous. There is mild periportal  edema. No intra or extrahepatic biliary duct dilatation. Gallbladder contains several calcified stones. No  pericholecystic fluid. Pancreas: Atrophic and otherwise normal appearance of the pancreas. Spleen: Normal in size without focal abnormality. Adrenals/Urinary Tract: Adrenal glands are normal in appearance. There is symmetric enhancement and excretion of the kidneys. The bladder and visualized portion of the urethra are normal. Stomach/Bowel: The stomach and bowel loops are normal in appearance. There are numerous colonic diverticula. There is thickening of the sigmoid segment which may be related to chronic diverticulosis. No associated inflammatory changes to indicate presence of acute diverticulitis. Vascular/Lymphatic: There is atherosclerotic calcification of the abdominal aorta not associated with aneurysm. Although involved by atherosclerosis, there is vascular opacification of the celiac axis, superior mesenteric artery, and inferior mesenteric artery. Normal appearance of the portal venous system and inferior vena cava. No retroperitoneal or mesenteric adenopathy. CT Reproductive: The uterus is surgically absent.  No adnexal mass. Other: No free pelvic fluid. Anterior abdominal wall is unremarkable. Musculoskeletal: Acute superior endplate fracture of F81. Chronic anterior wedge deformities of T11. Subacute compression fractures of L3, L4, and L5. Remote ORIF of the LEFT hip. No joint effusion or evidence for dislocation. Review of the MIP images confirms the above findings. IMPRESSION: 1. Acute fracture of T12. 2. No acute pulmonary embolus. 3. Spiculated pulmonary nodule in the RIGHT UPPER lobe measuring 6 millimeters. Morphology is suspicious and further evaluation is recommended. Recommend noncontrast CT of the chest in 3 months. 4.  Emphysema (ICD10-J43.9). 5. Subacute or chronic fractures of T11, L3, L4, and L5. Remote sternal fracture. 6. Mild periportal edema of uncertain etiology. 7. Colonic diverticulosis. Thickening of the sigmoid segment of the colon may be related to chronic diverticulosis.  There is no evidence for acute inflammatory change. 8. Hysterectomy. These results were called by telephone at the time of interpretation on 05/28/2018 at 3:15 pm to Dr. Rodell Perna , who verbally acknowledged these results. Electronically Signed   By: Nolon Nations M.D.   On: 05/28/2018 15:17   Ct L-spine No Charge  Result Date: 05/28/2018 CLINICAL DATA:  Low back pain EXAM: CT LUMBAR SPINE WITHOUT CONTRAST TECHNIQUE: Multidetector CT imaging of the lumbar spine was performed without intravenous contrast administration. Multiplanar CT image reconstructions were also generated. COMPARISON:  Lumbar MRI 01/29/2018 FINDINGS: Segmentation: Normal Alignment: Normal Vertebrae: Chronic compression fracture T11 unchanged -Mild fracture superior endplate of O17 appears acute -Inferior endplate compression fracture of L3 is new since the prior study. Sclerotic bone changes is present suggesting healing from recent fracture -Moderately severe compression fracture of L4 has progressed since the prior study with evidence of bone healing -Moderate compression fracture L5 is new since the prior MRI and shows evidence of healing with sclerotic changes. Paraspinal and other soft tissues: Bibasilar atelectasis. Atherosclerotic aorta. No paraspinous soft tissue swelling. Disc levels: T12-L1: Negative L1-2: Negative L2-3: Mild disc bulging and mild facet degeneration without significant stenosis L3-4: Disc degeneration and disc bulging with spurring. Bilateral mild facet degeneration. No significant stenosis L4-5: Disc degeneration with disc bulging and endplate spurring. Mild facet degeneration. No significant spinal stenosis L5-S1: Mild disc degeneration without stenosis. IMPRESSION: Acute compression fracture of T12 Subacute compression fractures of L3, L4, and L5. Chronic fracture of T11 Multilevel degenerative changes in the lumbar spine without significant stenosis. Atherosclerotic aorta Electronically Signed   By: Franchot Gallo M.D.   On: 05/28/2018 14:43    DVT Prophylaxis -SCD (anemia) AM Labs Ordered, also please review Full Orders  Family Communication: Admission,  patients condition and plan of care including tests being ordered have been discussed with the patient and sister and mother at bedside who indicate understanding and agree with the plan   Code Status - Full Code  Likely DC to  Home   Condition   Stable  Roxan Hockey M.D on 05/28/2018 at 6:17 PM Pager---[phone] Go to [email or web address] - [email or web address] for contact info  [facility] - Office  [phone]

## 2018-05-28 NOTE — Progress Notes (Signed)
PHARMACY NOTE -  Cefepime  Pharmacy has been assisting with dosing of Cefepime for fevers Dosage remains stable at 1g IV q12 hr and need for further dosage adjustment appears unlikely at present.    Will sign off at this time.  Please reconsult if a change in clinical status warrants re-evaluation of dosage.  Reuel Boom, PharmD, BCPS [phone] 05/28/2018, 5:36 PM

## 2018-05-28 NOTE — ED Notes (Signed)
ED TO INPATIENT HANDOFF REPORT  Name/Age/Gender [name] 77 y.o. female  Code Status Code Status History    Date Active Date Inactive Code Status Order ID Comments User Context   02/04/2018 2052 02/05/2018 2119 Full Code [id number]  Elaine Quill, DO ED   08/04/2015 1842 08/08/2015 2219 Full Code [id number]  Elaine Calender, PA-C Inpatient   08/03/2015 2006 08/04/2015 1842 Full Code [id number]  Elaine Hamman, MD ED    Advance Directive Documentation     Most Recent Value  Type of Advance Directive  Healthcare Power of Attorney, Living will  Pre-existing out of facility DNR order (yellow form or pink MOST form)  -  "MOST" Form in Place?  -      Home/SNF/Other Home  Chief Complaint back pain / not able to walk   Level of Care/Admitting Diagnosis ED Disposition    ED Disposition Condition [place]: [place] [100102]  Level of Care: Telemetry [5]  Admit to tele based on following criteria: Other see comments  Comments: anemia  Diagnosis: Symptomatic anemia [[id number]]  Admitting Physician: Morrison Old  Attending Physician: Morrison Old  Bed request comments: tele  PT Class (Do Not Modify): Observation [104]  PT Acc Code (Do Not Modify): Observation [10022]       Medical History Past Medical History:  Diagnosis Date  . B12 deficiency   . Bilateral carotid artery stenosis    right CCA and ICA 1-39%/    left ICA 40-59%  per last duplex  10-19-2015  . COPD (chronic obstructive pulmonary disease) ([place])   . Diverticulosis of colon   . History of diverticulitis of colon    2004  . 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 Renal and ureteric calculus    left    Allergies Allergies  Allergen Reactions  . Other Other (See Comments)    "Most antibiotics give yeast infection"  . Shellfish Allergy Hives    IV Location/Drains/Wounds Patient  Lines/Drains/Airways Status   Active Line/Drains/Airways    Name:   Placement date:   Placement time:   Site:   Days:   Peripheral IV 05/28/18 Left;Upper Arm   05/28/18    1217    Arm   less than 1   Incision (Closed) 08/04/15 Hip Left   08/04/15    1636     1028   Incision (Closed) 11/22/15 Perineum   11/22/15    1004     918          Labs/Imaging Results for orders placed or performed during the hospital encounter of 05/28/18 (from the past 48 hour(s))  Comprehensive metabolic panel     Status: Abnormal   Collection Time: 05/28/18 11:36 AM  Result Value Ref Range   Sodium 139 135 - 145 mmol/L   Potassium 4.0 3.5 - 5.1 mmol/L   Chloride 96 (L) 98 - 111 mmol/L   CO2 31 22 - 32 mmol/L   Glucose, Bld 117 (H) 70 - 99 mg/dL   BUN 25 (H) 8 - 23 mg/dL   Creatinine, Ser 0.76 0.44 - 1.00 mg/dL   Calcium 8.6 (L) 8.9 - 10.3 mg/dL   Total Protein 6.6 6.5 - 8.1 g/dL   Albumin 3.7 3.5 - 5.0 g/dL   AST 21 15 - 41 U/L   ALT 10 0 - 44 U/L   Alkaline Phosphatase 101 38 - 126  U/L   Total Bilirubin 4.4 (H) 0.3 - 1.2 mg/dL   GFR calc non Af Amer >60 >60 mL/min   GFR calc Af Amer >60 >60 mL/min    Comment: (NOTE) The eGFR has been calculated using the CKD EPI equation. This calculation has not been validated in all clinical situations. eGFR's persistently <60 mL/min signify possible Chronic Kidney Disease.    Anion gap 12 5 - 15    Comment: Performed at [facility], [place] [address] [place], [place] [address]  CBC with Differential     Status: Abnormal   Collection Time: 05/28/18 11:36 AM  Result Value Ref Range   WBC 10.4 4.0 - 10.5 K/uL   RBC 1.34 (L) 3.87 - 5.11 MIL/uL   Hemoglobin 4.8 (LL) 12.0 - 15.0 g/dL    Comment: REPEATED TO VERIFY CRITICAL RESULT CALLED TO, READ BACK BY AND VERIFIED WITH: M.FAWZE AT 1448 ON 05/28/18 BY N.THOMPSON    HCT 14.6 (L) 36.0 - 46.0 %   MCV 109.0 (H) 78.0 - 100.0 fL   MCH 35.8 (H) 26.0 - 34.0 pg   MCHC 32.9 30.0 - 36.0 g/dL     Comment: CORRECTED FOR COLD AGGLUTININS   RDW 19.5 (H) 11.5 - 15.5 %   Platelets 612 (H) 150 - 400 K/uL   Neutrophils Relative % 71 %   Lymphocytes Relative 18 %   Monocytes Relative 11 %   Eosinophils Relative 0 %   Basophils Relative 0 %   Neutro Abs 7.4 1.7 - 7.7 K/uL   Lymphs Abs 1.9 0.7 - 4.0 K/uL   Monocytes Absolute 1.1 (H) 0.1 - 1.0 K/uL   Eosinophils Absolute 0.0 0.0 - 0.7 K/uL   Basophils Absolute 0.0 0.0 - 0.1 K/uL   RBC Morphology POLYCHROMASIA PRESENT     Comment: AGGLUTINATION BASOPHILIC STIPPLING RARE NRBCs    WBC Morphology MILD LEFT SHIFT (1-5% METAS, OCC MYELO, OCC BANDS)     Comment: TOXIC GRANULATION VACUOLATED NEUTROPHILS Performed at [facility], [place] [address] [place], [place] [address]   Type and screen [place]     Status: None (Preliminary result)   Collection Time: 05/28/18  3:30 PM  Result Value Ref Range   ABO/RH(D) PENDING    Antibody Screen PENDING    Sample Expiration      05/31/2018 Performed at [facility], [place] [address] [place], [place] [address]   Prepare RBC     Status: None   Collection Time: 05/28/18  3:30 PM  Result Value Ref Range   Order Confirmation      ORDER PROCESSED BY BLOOD BANK Performed at [place] [address] [place], [place] [address]   I-Stat Troponin, ED (not at [facility])     Status: None   Collection Time: 05/28/18  3:37 PM  Result Value Ref Range   Troponin i, poc 0.03 0.00 - 0.08 ng/mL   Comment 3            Comment: Due to the release kinetics of cTnI, a negative result within the first hours of the onset of symptoms does not rule out myocardial infarction with certainty. If myocardial infarction is still suspected, repeat the test at appropriate intervals.    Dg Chest 2 View  Result Date: 05/28/2018 CLINICAL DATA:  Hypoxia. EXAM: CHEST - 2 VIEW COMPARISON:  06/12/2016 and prior radiographs FINDINGS: UPPER limits normal  heart size again noted. Mild peribronchial thickening is unchanged.  LEFT basilar noted. No definite pleural effusion or pneumothorax. No acute bony abnormalities are present. IMPRESSION: LEFT basilar opacity which may represent airspace disease or atelectasis. Radiographic follow-up to resolution recommended. Electronically Signed   By: Margarette Canada M.D.   On: 05/28/2018 12:49   Ct Angio Chest Pe W And/or Wo Contrast  Result Date: 05/28/2018 CLINICAL DATA:  "Pt complains of lower back pain and left hip pain. Pt states pain has been worse since receiving and epidural a week ago " EXAM: CT ANGIOGRAPHY CHEST CT ABDOMEN AND PELVIS WITH CONTRAST TECHNIQUE: Multidetector CT imaging of the chest was performed using the standard protocol during bolus administration of intravenous contrast. Multiplanar CT image reconstructions and MIPs were obtained to evaluate the vascular anatomy. Multidetector CT imaging of the abdomen and pelvis was performed using the standard protocol during bolus administration of intravenous contrast. CONTRAST:  189m ISOVUE-370 IOPAMIDOL (ISOVUE-370) INJECTION 76% COMPARISON:  02/04/2018 FINDINGS: CTA CHEST FINDINGS Cardiovascular: The heart size is UPPER normal. No significant coronary artery calcifications. No pericardial effusion. There is atherosclerotic calcification of the thoracic aorta not associated with aneurysm. The appearance of the pulmonary arteries is unremarkable. The arteries are well opacified and there is no acute pulmonary embolus. Mediastinum/Nodes: The visualized portion of the thyroid gland has a normal appearance. The esophagus is normal in appearance. No mediastinal, hilar, or axillary adenopathy. Lungs/Pleura: There are small bilateral pleural effusions. There is bibasilar atelectasis. Scattered emphysematous changes are identified primarily in the UPPER lobes. Airways are patent. No focal consolidations. Within the RIGHT UPPER lobe there is a spiculated 6 millimeter mass  best seen on image 47 of series 4 a 4 millimeter nodule is identified in the RIGHT UPPER lobe on image 64 of series 4. Musculoskeletal: Remote sternal fracture. Degenerative changes are identified in the thoracic spine. Acute compression fracture of T12. Review of the MIP images confirms the above findings. CT ABDOMEN and PELVIS FINDINGS Hepatobiliary: The liver is homogeneous. There is mild periportal edema. No intra or extrahepatic biliary duct dilatation. Gallbladder contains several calcified stones. No pericholecystic fluid. Pancreas: Atrophic and otherwise normal appearance of the pancreas. Spleen: Normal in size without focal abnormality. Adrenals/Urinary Tract: Adrenal glands are normal in appearance. There is symmetric enhancement and excretion of the kidneys. The bladder and visualized portion of the urethra are normal. Stomach/Bowel: The stomach and bowel loops are normal in appearance. There are numerous colonic diverticula. There is thickening of the sigmoid segment which may be related to chronic diverticulosis. No associated inflammatory changes to indicate presence of acute diverticulitis. Vascular/Lymphatic: There is atherosclerotic calcification of the abdominal aorta not associated with aneurysm. Although involved by atherosclerosis, there is vascular opacification of the celiac axis, superior mesenteric artery, and inferior mesenteric artery. Normal appearance of the portal venous system and inferior vena cava. No retroperitoneal or mesenteric adenopathy. CT Reproductive: The uterus is surgically absent.  No adnexal mass. Other: No free pelvic fluid. Anterior abdominal wall is unremarkable. Musculoskeletal: Acute superior endplate fracture of TT34 Chronic anterior wedge deformities of T11. Subacute compression fractures of L3, L4, and L5. Remote ORIF of the LEFT hip. No joint effusion or evidence for dislocation. Review of the MIP images confirms the above findings. IMPRESSION: 1. Acute fracture  of T12. 2. No acute pulmonary embolus. 3. Spiculated pulmonary nodule in the RIGHT UPPER lobe measuring 6 millimeters. Morphology is suspicious and further evaluation is recommended. Recommend noncontrast CT of the chest in 3 months. 4.  Emphysema (ICD10-J43.9). 5. Subacute or chronic fractures of  T11, L3, L4, and L5. Remote sternal fracture. 6. Mild periportal edema of uncertain etiology. 7. Colonic diverticulosis. Thickening of the sigmoid segment of the colon may be related to chronic diverticulosis. There is no evidence for acute inflammatory change. 8. Hysterectomy. These results were called by telephone at the time of interpretation on 05/28/2018 at 3:15 pm to Dr. Rodell Perna , who verbally acknowledged these results. Electronically Signed   By: Nolon Nations M.D.   On: 05/28/2018 15:17   Ct Abdomen Pelvis W Contrast  Result Date: 05/28/2018 CLINICAL DATA:  "Pt complains of lower back pain and left hip pain. Pt states pain has been worse since receiving and epidural a week ago " EXAM: CT ANGIOGRAPHY CHEST CT ABDOMEN AND PELVIS WITH CONTRAST TECHNIQUE: Multidetector CT imaging of the chest was performed using the standard protocol during bolus administration of intravenous contrast. Multiplanar CT image reconstructions and MIPs were obtained to evaluate the vascular anatomy. Multidetector CT imaging of the abdomen and pelvis was performed using the standard protocol during bolus administration of intravenous contrast. CONTRAST:  147m ISOVUE-370 IOPAMIDOL (ISOVUE-370) INJECTION 76% COMPARISON:  02/04/2018 FINDINGS: CTA CHEST FINDINGS Cardiovascular: The heart size is UPPER normal. No significant coronary artery calcifications. No pericardial effusion. There is atherosclerotic calcification of the thoracic aorta not associated with aneurysm. The appearance of the pulmonary arteries is unremarkable. The arteries are well opacified and there is no acute pulmonary embolus. Mediastinum/Nodes: The visualized  portion of the thyroid gland has a normal appearance. The esophagus is normal in appearance. No mediastinal, hilar, or axillary adenopathy. Lungs/Pleura: There are small bilateral pleural effusions. There is bibasilar atelectasis. Scattered emphysematous changes are identified primarily in the UPPER lobes. Airways are patent. No focal consolidations. Within the RIGHT UPPER lobe there is a spiculated 6 millimeter mass best seen on image 47 of series 4 a 4 millimeter nodule is identified in the RIGHT UPPER lobe on image 64 of series 4. Musculoskeletal: Remote sternal fracture. Degenerative changes are identified in the thoracic spine. Acute compression fracture of T12. Review of the MIP images confirms the above findings. CT ABDOMEN and PELVIS FINDINGS Hepatobiliary: The liver is homogeneous. There is mild periportal edema. No intra or extrahepatic biliary duct dilatation. Gallbladder contains several calcified stones. No pericholecystic fluid. Pancreas: Atrophic and otherwise normal appearance of the pancreas. Spleen: Normal in size without focal abnormality. Adrenals/Urinary Tract: Adrenal glands are normal in appearance. There is symmetric enhancement and excretion of the kidneys. The bladder and visualized portion of the urethra are normal. Stomach/Bowel: The stomach and bowel loops are normal in appearance. There are numerous colonic diverticula. There is thickening of the sigmoid segment which may be related to chronic diverticulosis. No associated inflammatory changes to indicate presence of acute diverticulitis. Vascular/Lymphatic: There is atherosclerotic calcification of the abdominal aorta not associated with aneurysm. Although involved by atherosclerosis, there is vascular opacification of the celiac axis, superior mesenteric artery, and inferior mesenteric artery. Normal appearance of the portal venous system and inferior vena cava. No retroperitoneal or mesenteric adenopathy. CT Reproductive: The uterus  is surgically absent.  No adnexal mass. Other: No free pelvic fluid. Anterior abdominal wall is unremarkable. Musculoskeletal: Acute superior endplate fracture of TW29 Chronic anterior wedge deformities of T11. Subacute compression fractures of L3, L4, and L5. Remote ORIF of the LEFT hip. No joint effusion or evidence for dislocation. Review of the MIP images confirms the above findings. IMPRESSION: 1. Acute fracture of T12. 2. No acute pulmonary embolus. 3. Spiculated pulmonary nodule in  the RIGHT UPPER lobe measuring 6 millimeters. Morphology is suspicious and further evaluation is recommended. Recommend noncontrast CT of the chest in 3 months. 4.  Emphysema (ICD10-J43.9). 5. Subacute or chronic fractures of T11, L3, L4, and L5. Remote sternal fracture. 6. Mild periportal edema of uncertain etiology. 7. Colonic diverticulosis. Thickening of the sigmoid segment of the colon may be related to chronic diverticulosis. There is no evidence for acute inflammatory change. 8. Hysterectomy. These results were called by telephone at the time of interpretation on 05/28/2018 at 3:15 pm to Dr. Rodell Perna , who verbally acknowledged these results. Electronically Signed   By: Nolon Nations M.D.   On: 05/28/2018 15:17   Ct L-spine No Charge  Result Date: 05/28/2018 CLINICAL DATA:  Low back pain EXAM: CT LUMBAR SPINE WITHOUT CONTRAST TECHNIQUE: Multidetector CT imaging of the lumbar spine was performed without intravenous contrast administration. Multiplanar CT image reconstructions were also generated. COMPARISON:  Lumbar MRI 01/29/2018 FINDINGS: Segmentation: Normal Alignment: Normal Vertebrae: Chronic compression fracture T11 unchanged -Mild fracture superior endplate of Y81 appears acute -Inferior endplate compression fracture of L3 is new since the prior study. Sclerotic bone changes is present suggesting healing from recent fracture -Moderately severe compression fracture of L4 has progressed since the prior study with  evidence of bone healing -Moderate compression fracture L5 is new since the prior MRI and shows evidence of healing with sclerotic changes. Paraspinal and other soft tissues: Bibasilar atelectasis. Atherosclerotic aorta. No paraspinous soft tissue swelling. Disc levels: T12-L1: Negative L1-2: Negative L2-3: Mild disc bulging and mild facet degeneration without significant stenosis L3-4: Disc degeneration and disc bulging with spurring. Bilateral mild facet degeneration. No significant stenosis L4-5: Disc degeneration with disc bulging and endplate spurring. Mild facet degeneration. No significant spinal stenosis L5-S1: Mild disc degeneration without stenosis. IMPRESSION: Acute compression fracture of T12 Subacute compression fractures of L3, L4, and L5. Chronic fracture of T11 Multilevel degenerative changes in the lumbar spine without significant stenosis. Atherosclerotic aorta Electronically Signed   By: Franchot Gallo M.D.   On: 05/28/2018 14:43    Pending Labs Unresulted Labs (From admission, onward)    Start     Ordered   05/28/18 1136  Urinalysis, Routine w reflex microscopic  STAT,   STAT     05/28/18 1135   Signed and Held  Basic metabolic panel  Tomorrow morning,   R     Signed and Held   Signed and Held  CBC  Tomorrow morning,   R     Signed and Held          Vitals/Pain Today's Vitals   05/28/18 1228 05/28/18 1330 05/28/18 1346 05/28/18 1556  BP:  (!) 107/46    Pulse:  86    Resp:  18    Temp:      TempSrc:      SpO2:  96%    PainSc: _0 Isolation Precautions No active isolations  Medications Medications  iopamidol (ISOVUE-370) 76 % injection (has no administration in time range)  0.9 %  sodium chloride infusion (has no administration in time range)  morphine 2 MG/ML injection 2 mg (2 mg Intravenous Given 05/28/18 1230)  iopamidol (ISOVUE-370) 76 % injection 100 mL (100 mLs Intravenous Contrast Given 05/28/18 1401)  morphine 4 MG/ML injection 4 mg (4 mg  Intravenous Given 05/28/18 1602)    Mobility walks with device

## 2018-05-28 NOTE — Plan of Care (Signed)
Pt able to maintain97% @ 2LPM. Down from 4 LPM. Pt does not tolerate ambulation well d/t weakness.

## 2018-05-28 NOTE — ED Provider Notes (Signed)
[facility] DEPT Provider Note   CSN: [id number] Arrival date & time: 05/28/18  4098     History   Chief Complaint Chief Complaint  Patient presents with  . Back Pain    HPI [name] is a 77 y.o. female with history of vitamin B12 deficiency, bilateral carotid artery stenosis, COPD, diverticulitis, idiopathic chronic cold agglutinin disease, and nephrolithiasis presents for evaluation of acute onset, progressively worsening low back and left leg pain.  She has been dealing with this for several months and recently went to see an orthopedist who performed epidural steroid injection approximately 1 week ago.  She notes 2 days ago she had acute worsening of her low back and left lower extremity pain.  Pain is constant, worsens with bending.  She states that she has been essentially unable to ambulate for the past 2 weeks or so.  Denies bowel or bladder incontinence, saddle anesthesia.  Notes she had a fever of around 102 F 2 days ago.  Has been taking extra strength Tylenol without relief of her symptoms.  Also notes 1 week history of lower abdominal pain which she thinks is consistent with her diverticulitis.  Denies nausea, vomiting, diarrhea, constipation, melena, hematochezia.  Denies urinary symptoms.  The history is provided by the patient and a relative.    Past Medical History:  Diagnosis Date  . B12 deficiency   . Bilateral carotid artery stenosis    right CCA and ICA 1-39%/    left ICA 40-59%  per last duplex  10-19-2015  . COPD (chronic obstructive pulmonary disease) ([place])   . Diverticulosis of colon   . History of diverticulitis of colon    2004  . 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[place])    montiored by dr Marko Plume  . Renal and ureteric calculus    left    Patient Active Problem List   Diagnosis Date Noted  . 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  Occult blood in stools 02/04/2018  . Ureteral calculus 11/12/2015  . Breast cancer screening, high risk patient 08/29/2015  . Acute delirium 08/07/2015  . Postoperative anemia due to acute blood loss 08/06/2015  . Protein-calorie malnutrition, severe 08/04/2015  . Closed intertrochanteric fracture of left femur ([place]) 08/03/2015  . Fall 08/03/2015  . Closed right hip fracture ([place]) 08/03/2015  . Hyperbilirubinemia 08/01/2013  . Hypokalemia 07/30/2013  . Vitamin B 12 deficiency 08/07/2012  . 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[place]) 06/10/2012  . Tobacco abuse 06/10/2012    Past Surgical History:  Procedure Laterality Date  . ABDOMINAL HYSTERECTOMY  1986  . APPENDECTOMY  1967  . BREAST EXCISIONAL BIOPSY Right 1984  . CATARACT EXTRACTION W/ INTRAOCULAR LENS IMPLANT Right 1990's   congential cataract  . CYSTOSCOPY W/ URETERAL STENT PLACEMENT  11/12/2015   Procedure: CYSTOSCOPY WITH RETROGRADE PYELOGRAM/URETERAL STENT PLACEMENT;  Surgeon: [name], MD;  Location: [facility];  Service: Urology;;  . Consuela Mimes W/ URETERAL STENT REMOVAL Left 11/22/2015   Procedure: CYSTOSCOPY WITH STENT REMOVAL;  Surgeon: [name], MD;  Location[facility];  Service: Urology;  Laterality: Left;  . CYSTOSCOPY/URETEROSCOPY/HOLMIUM LASER Left 11/22/2015   Procedure: CYSTOSCOPY/URETEROSCOPY/HOLMIUM LASER;  Surgeon: [name], MD;  Location: [facility];  Service: Urology;  Laterality: Left;  FLEX URETEROSCOPY  . FEMUR IM NAIL Left 08/04/2015   Procedure: INTRAMEDULLARY (IM) NAIL FEMORAL;  Surgeon: [name], MD;  Location: [facility];  Service: Orthopedics;  Laterality: Left;  . TONSILLECTOMY  age 54     OB History   None      Home Medications    Prior to Admission medications   Medication Sig Start Date End Date Taking? Authorizing Provider  acetaminophen (TYLENOL) 325 MG tablet Take 2 tablets (650 mg total) by mouth every 6 (six) hours as needed for mild  pain (or Fever >/= 101). 08/08/15  Yes Delfina Redwood, MD  Cyanocobalamin ([employer] B-12) 1000 MCG TBCR TAKE 1 TABLET DAILY 05/22/17  Yes Brunetta Genera, MD  docusate sodium (COLACE) 100 MG capsule Take 1 capsule (100 mg total) by mouth daily. 08/08/15  Yes Delfina Redwood, MD  folic acid (FOLVITE) 1 MG tablet Take 1 tablet (1 mg total) by mouth daily. 05/22/17  Yes Brunetta Genera, MD  Tetrahydrozoline HCl (VISINE OP) Apply 1 drop to eye daily as needed (allergies/dry eyes).   Yes [provider]  potassium chloride SA (K-DUR,KLOR-CON) 20 MEQ tablet 40 meq PO twice daily for 3 days then 14meq po daily. Please repeat potassium levels with PCP in 1 month to address additional potassium. Patient not taking: Reported on 05/28/2018 04/17/18   Brunetta Genera, MD  traMADol (ULTRAM) 50 MG tablet Take 1 tablet (50 mg total) by mouth every 8 (eight) hours as needed. Patient not taking: Reported on 05/28/2018 02/18/18   Brunetta Genera, MD    Family History Family History  Problem Relation Age of Onset  . Breast cancer Cousin     Social History Social History   Tobacco Use  . Smoking status: Former Smoker    Packs/day: 1.00    Types: Cigarettes    Last attempt to quit: 08/03/2015    Years since quitting: 2.8  . Smokeless tobacco: Never Used  Substance Use Topics  . Alcohol use: Yes    Comment: occ  . Drug use: Not on file     Allergies   Other and Shellfish allergy   Review of Systems Review of Systems  Constitutional: Negative for chills and fever.  Respiratory: Negative for cough and shortness of breath.   Cardiovascular: Negative for chest pain.  Gastrointestinal: Positive for abdominal pain. Negative for constipation, diarrhea, nausea and vomiting.  Musculoskeletal: Positive for arthralgias and back pain.  Skin: Positive for pallor.  All other systems reviewed and are negative.    Physical Exam Updated Vital Signs BP (!) 107/46   Pulse 86    Temp 98.7 F (37.1 C) (Oral)   Resp 18   SpO2 96%   Physical Exam  Constitutional: She appears well-developed and well-nourished. No distress.  HENT:  Head: Normocephalic and atraumatic.  Eyes: Conjunctivae are normal. Right eye exhibits no discharge. Left eye exhibits no discharge.  Neck: Normal range of motion. Neck supple. No JVD present. No tracheal deviation present.  Cardiovascular: Normal rate, regular rhythm and intact distal pulses.  2+ radial and DP/PT pulses bilaterally, Homans sign absent bilaterally, 2+ pitting edema of the bilateral lower extremities, no palpable cords, compartments are soft   Pulmonary/Chest: Effort normal and breath sounds normal.  SPO2 saturations around 95% on 4 L via nasal cannula  Abdominal: Soft. Bowel sounds are normal. She exhibits no distension. There is tenderness in the suprapubic area and left lower quadrant. There is no guarding.  Musculoskeletal: She exhibits no edema.  No midline spine TTP, no paraspinal muscle tenderness, no deformity, crepitus, or step-off noted.  5/5 strength of BLE major muscle groups  although pain worsens with left hip flexion.  Severely decreased range of motion of the lumbar spine, patient unable to sit upright secondary to worsening pain in the lumbar spine.   Neurological: She is alert.  Fluent speech, facial droop, sensation intact to soft touch of bilateral lower extremities.  Skin: Skin is warm and dry. No erythema.  Psychiatric: She has a normal mood and affect. Her behavior is normal.  Nursing note and vitals reviewed.    ED Treatments / Results  Labs (all labs ordered are listed, but only abnormal results are displayed) Labs Reviewed  COMPREHENSIVE METABOLIC PANEL - Abnormal; Notable for the following components:      Result Value   Chloride 96 (*)    Glucose, Bld 117 (*)    BUN 25 (*)    Calcium 8.6 (*)    Total Bilirubin 4.4 (*)    All other components within normal limits  CBC WITH  DIFFERENTIAL/PLATELET - Abnormal; Notable for the following components:   RBC 1.34 (*)    Hemoglobin 4.8 (*)    HCT 14.6 (*)    MCV 109.0 (*)    MCH 35.8 (*)    RDW 19.5 (*)    Platelets 612 (*)    All other components within normal limits  URINALYSIS, ROUTINE W REFLEX MICROSCOPIC  I-STAT TROPONIN, ED  TYPE AND SCREEN  PREPARE RBC (CROSSMATCH)    EKG EKG Interpretation  Date/Time:  Thursday May 28 2018 11:57:20 EDT Ventricular Rate:  86 PR Interval:    QRS Duration: 91 QT Interval:  388 QTC Calculation: 465 R Axis:   83 Text Interpretation:  Sinus rhythm Borderline right axis deviation Borderline repolarization abnormality similar to prior 5/19 Confirmed by Aletta Edouard [phone]) on 05/28/2018 12:42:08 PM   Radiology Dg Chest 2 View  Result Date: 05/28/2018 CLINICAL DATA:  Hypoxia. EXAM: CHEST - 2 VIEW COMPARISON:  06/12/2016 and prior radiographs FINDINGS: UPPER limits normal heart size again noted. Mild peribronchial thickening is unchanged. LEFT basilar noted. No definite pleural effusion or pneumothorax. No acute bony abnormalities are present. IMPRESSION: LEFT basilar opacity which may represent airspace disease or atelectasis. Radiographic follow-up to resolution recommended. Electronically Signed   By: Margarette Canada M.D.   On: 05/28/2018 12:49   Ct Angio Chest Pe W And/or Wo Contrast  Result Date: 05/28/2018 CLINICAL DATA:  "Pt complains of lower back pain and left hip pain. Pt states pain has been worse since receiving and epidural a week ago " EXAM: CT ANGIOGRAPHY CHEST CT ABDOMEN AND PELVIS WITH CONTRAST TECHNIQUE: Multidetector CT imaging of the chest was performed using the standard protocol during bolus administration of intravenous contrast. Multiplanar CT image reconstructions and MIPs were obtained to evaluate the vascular anatomy. Multidetector CT imaging of the abdomen and pelvis was performed using the standard protocol during bolus administration of intravenous  contrast. CONTRAST:  150mL ISOVUE-370 IOPAMIDOL (ISOVUE-370) INJECTION 76% COMPARISON:  02/04/2018 FINDINGS: CTA CHEST FINDINGS Cardiovascular: The heart size is UPPER normal. No significant coronary artery calcifications. No pericardial effusion. There is atherosclerotic calcification of the thoracic aorta not associated with aneurysm. The appearance of the pulmonary arteries is unremarkable. The arteries are well opacified and there is no acute pulmonary embolus. Mediastinum/Nodes: The visualized portion of the thyroid gland has a normal appearance. The esophagus is normal in appearance. No mediastinal, hilar, or axillary adenopathy. Lungs/Pleura: There are small bilateral pleural effusions. There is bibasilar atelectasis. Scattered emphysematous changes are identified primarily in the UPPER lobes. Airways are patent.  No focal consolidations. Within the RIGHT UPPER lobe there is a spiculated 6 millimeter mass best seen on image 47 of series 4 a 4 millimeter nodule is identified in the RIGHT UPPER lobe on image 64 of series 4. Musculoskeletal: Remote sternal fracture. Degenerative changes are identified in the thoracic spine. Acute compression fracture of T12. Review of the MIP images confirms the above findings. CT ABDOMEN and PELVIS FINDINGS Hepatobiliary: The liver is homogeneous. There is mild periportal edema. No intra or extrahepatic biliary duct dilatation. Gallbladder contains several calcified stones. No pericholecystic fluid. Pancreas: Atrophic and otherwise normal appearance of the pancreas. Spleen: Normal in size without focal abnormality. Adrenals/Urinary Tract: Adrenal glands are normal in appearance. There is symmetric enhancement and excretion of the kidneys. The bladder and visualized portion of the urethra are normal. Stomach/Bowel: The stomach and bowel loops are normal in appearance. There are numerous colonic diverticula. There is thickening of the sigmoid segment which may be related to  chronic diverticulosis. No associated inflammatory changes to indicate presence of acute diverticulitis. Vascular/Lymphatic: There is atherosclerotic calcification of the abdominal aorta not associated with aneurysm. Although involved by atherosclerosis, there is vascular opacification of the celiac axis, superior mesenteric artery, and inferior mesenteric artery. Normal appearance of the portal venous system and inferior vena cava. No retroperitoneal or mesenteric adenopathy. CT Reproductive: The uterus is surgically absent.  No adnexal mass. Other: No free pelvic fluid. Anterior abdominal wall is unremarkable. Musculoskeletal: Acute superior endplate fracture of E36. Chronic anterior wedge deformities of T11. Subacute compression fractures of L3, L4, and L5. Remote ORIF of the LEFT hip. No joint effusion or evidence for dislocation. Review of the MIP images confirms the above findings. IMPRESSION: 1. Acute fracture of T12. 2. No acute pulmonary embolus. 3. Spiculated pulmonary nodule in the RIGHT UPPER lobe measuring 6 millimeters. Morphology is suspicious and further evaluation is recommended. Recommend noncontrast CT of the chest in 3 months. 4.  Emphysema (ICD10-J43.9). 5. Subacute or chronic fractures of T11, L3, L4, and L5. Remote sternal fracture. 6. Mild periportal edema of uncertain etiology. 7. Colonic diverticulosis. Thickening of the sigmoid segment of the colon may be related to chronic diverticulosis. There is no evidence for acute inflammatory change. 8. Hysterectomy. These results were called by telephone at the time of interpretation on 05/28/2018 at 3:15 pm to Dr. Rodell Perna , who verbally acknowledged these results. Electronically Signed   By: Nolon Nations M.D.   On: 05/28/2018 15:17   Ct Abdomen Pelvis W Contrast  Result Date: 05/28/2018 CLINICAL DATA:  "Pt complains of lower back pain and left hip pain. Pt states pain has been worse since receiving and epidural a week ago " EXAM: CT  ANGIOGRAPHY CHEST CT ABDOMEN AND PELVIS WITH CONTRAST TECHNIQUE: Multidetector CT imaging of the chest was performed using the standard protocol during bolus administration of intravenous contrast. Multiplanar CT image reconstructions and MIPs were obtained to evaluate the vascular anatomy. Multidetector CT imaging of the abdomen and pelvis was performed using the standard protocol during bolus administration of intravenous contrast. CONTRAST:  174mL ISOVUE-370 IOPAMIDOL (ISOVUE-370) INJECTION 76% COMPARISON:  02/04/2018 FINDINGS: CTA CHEST FINDINGS Cardiovascular: The heart size is UPPER normal. No significant coronary artery calcifications. No pericardial effusion. There is atherosclerotic calcification of the thoracic aorta not associated with aneurysm. The appearance of the pulmonary arteries is unremarkable. The arteries are well opacified and there is no acute pulmonary embolus. Mediastinum/Nodes: The visualized portion of the thyroid gland has a normal appearance. The esophagus  is normal in appearance. No mediastinal, hilar, or axillary adenopathy. Lungs/Pleura: There are small bilateral pleural effusions. There is bibasilar atelectasis. Scattered emphysematous changes are identified primarily in the UPPER lobes. Airways are patent. No focal consolidations. Within the RIGHT UPPER lobe there is a spiculated 6 millimeter mass best seen on image 47 of series 4 a 4 millimeter nodule is identified in the RIGHT UPPER lobe on image 64 of series 4. Musculoskeletal: Remote sternal fracture. Degenerative changes are identified in the thoracic spine. Acute compression fracture of T12. Review of the MIP images confirms the above findings. CT ABDOMEN and PELVIS FINDINGS Hepatobiliary: The liver is homogeneous. There is mild periportal edema. No intra or extrahepatic biliary duct dilatation. Gallbladder contains several calcified stones. No pericholecystic fluid. Pancreas: Atrophic and otherwise normal appearance of the  pancreas. Spleen: Normal in size without focal abnormality. Adrenals/Urinary Tract: Adrenal glands are normal in appearance. There is symmetric enhancement and excretion of the kidneys. The bladder and visualized portion of the urethra are normal. Stomach/Bowel: The stomach and bowel loops are normal in appearance. There are numerous colonic diverticula. There is thickening of the sigmoid segment which may be related to chronic diverticulosis. No associated inflammatory changes to indicate presence of acute diverticulitis. Vascular/Lymphatic: There is atherosclerotic calcification of the abdominal aorta not associated with aneurysm. Although involved by atherosclerosis, there is vascular opacification of the celiac axis, superior mesenteric artery, and inferior mesenteric artery. Normal appearance of the portal venous system and inferior vena cava. No retroperitoneal or mesenteric adenopathy. CT Reproductive: The uterus is surgically absent.  No adnexal mass. Other: No free pelvic fluid. Anterior abdominal wall is unremarkable. Musculoskeletal: Acute superior endplate fracture of B14. Chronic anterior wedge deformities of T11. Subacute compression fractures of L3, L4, and L5. Remote ORIF of the LEFT hip. No joint effusion or evidence for dislocation. Review of the MIP images confirms the above findings. IMPRESSION: 1. Acute fracture of T12. 2. No acute pulmonary embolus. 3. Spiculated pulmonary nodule in the RIGHT UPPER lobe measuring 6 millimeters. Morphology is suspicious and further evaluation is recommended. Recommend noncontrast CT of the chest in 3 months. 4.  Emphysema (ICD10-J43.9). 5. Subacute or chronic fractures of T11, L3, L4, and L5. Remote sternal fracture. 6. Mild periportal edema of uncertain etiology. 7. Colonic diverticulosis. Thickening of the sigmoid segment of the colon may be related to chronic diverticulosis. There is no evidence for acute inflammatory change. 8. Hysterectomy. These results  were called by telephone at the time of interpretation on 05/28/2018 at 3:15 pm to Dr. Rodell Perna , who verbally acknowledged these results. Electronically Signed   By: Nolon Nations M.D.   On: 05/28/2018 15:17   Ct L-spine No Charge  Result Date: 05/28/2018 CLINICAL DATA:  Low back pain EXAM: CT LUMBAR SPINE WITHOUT CONTRAST TECHNIQUE: Multidetector CT imaging of the lumbar spine was performed without intravenous contrast administration. Multiplanar CT image reconstructions were also generated. COMPARISON:  Lumbar MRI 01/29/2018 FINDINGS: Segmentation: Normal Alignment: Normal Vertebrae: Chronic compression fracture T11 unchanged -Mild fracture superior endplate of N82 appears acute -Inferior endplate compression fracture of L3 is new since the prior study. Sclerotic bone changes is present suggesting healing from recent fracture -Moderately severe compression fracture of L4 has progressed since the prior study with evidence of bone healing -Moderate compression fracture L5 is new since the prior MRI and shows evidence of healing with sclerotic changes. Paraspinal and other soft tissues: Bibasilar atelectasis. Atherosclerotic aorta. No paraspinous soft tissue swelling. Disc levels: T12-L1: Negative  L1-2: Negative L2-3: Mild disc bulging and mild facet degeneration without significant stenosis L3-4: Disc degeneration and disc bulging with spurring. Bilateral mild facet degeneration. No significant stenosis L4-5: Disc degeneration with disc bulging and endplate spurring. Mild facet degeneration. No significant spinal stenosis L5-S1: Mild disc degeneration without stenosis. IMPRESSION: Acute compression fracture of T12 Subacute compression fractures of L3, L4, and L5. Chronic fracture of T11 Multilevel degenerative changes in the lumbar spine without significant stenosis. Atherosclerotic aorta Electronically Signed   By: Franchot Gallo M.D.   On: 05/28/2018 14:43    Procedures .Critical Care Performed by:  Renita Papa, PA-C Authorized by: Renita Papa, PA-C   Critical care provider statement:    Critical care time (minutes):  35   Critical care was necessary to treat or prevent imminent or life-threatening deterioration of the following conditions:  Circulatory failure   Critical care was time spent personally by me on the following activities:  Blood draw for specimens, ordering and performing treatments and interventions, ordering and review of laboratory studies, ordering and review of radiographic studies, pulse oximetry, re-evaluation of patient's condition, review of old charts, obtaining history from patient or surrogate, examination of patient and evaluation of patient's response to treatment   I assumed direction of critical care for this patient from another provider in my specialty: no     (including critical care time)  Medications Ordered in ED Medications  iopamidol (ISOVUE-370) 76 % injection (has no administration in time range)  0.9 %  sodium chloride infusion (has no administration in time range)  morphine 2 MG/ML injection 2 mg (2 mg Intravenous Given 05/28/18 1230)  iopamidol (ISOVUE-370) 76 % injection 100 mL (100 mLs Intravenous Contrast Given 05/28/18 1401)  morphine 4 MG/ML injection 4 mg (4 mg Intravenous Given 05/28/18 1602)     Initial Impression / Assessment and Plan / ED Course  I have reviewed the triage vital signs and the nursing notes.  Pertinent labs & imaging results that were available during my care of the patient were reviewed by me and considered in my medical decision making (see chart for details).  Clinical Course as of May 29 1619  Thu May 28, 7624  313 77 year old female with history of anemia requiring transfusions and lumbar compression fracture here with worsening of her back and hip pain.  She was also found to be quite hypoxic out of triage.  No real fever no shortness of breath.  She is cachectic appearing but in no distress.  She is getting  some labs and imaging.  Likely admission if she truly remains oxygen dependent as she is not on oxygen at home.   [MB]    Clinical Course User Index [MB] Hayden Rasmussen, MD    Patient presents with complaint of progressively worsening low back pain and left lower extremity pain.  Afebrile, initially hypoxic in triage with improvement when placed on 4 L via nasal cannula.  Neurovascularly intact.  Significant pain with flexion of the lumbar spine.  No red flag signs concerning for cauda equina or spinal abscess.  Unable to elicit pain with midline palpation.  Found to be anemic with hemoglobin of 4.8.  Also of note, imaging of the lumbar spine notes acute T12 fracture and subacute fractures of T11, L3, L4, and L5. May require TLSO brace or further recommendations from ortho. No evidence of PE or pneumonia on CTA of the chest however she does have a 6 mm right upper lobe nodule which  is suspicious.  Radiology recommends repeat non-contrast CT in 3 months. She remains hypoxic on room air though shows no significantly increased work of breathing. Blood transfusion initiated in the ED. Spoke with Dr. Denton Brick who agrees to assume care of patient and bring her into the hospital for further evaluation and management. Patient seen and evaluated by Dr. Melina Copa who agrees with assessment and plan at this time.  Final Clinical Impressions(s) / ED Diagnoses   Final diagnoses:  Back pain  Symptomatic anemia  Lung nodule  T12 compression fracture (HCC)  Closed compression fracture of third lumbar vertebra, initial encounter (HCC)  Closed compression fracture of fourth lumbar vertebra, initial encounter ([place])  Closed compression fracture of fifth lumbar vertebra, initial encounter [facility])    ED Discharge Orders    None       Renita Papa, PA-C 05/28/18 1628    Hayden Rasmussen, MD 05/28/18 Kathyrn Drown

## 2018-05-28 NOTE — ED Triage Notes (Signed)
Pt complains of lower back pain and left hip pain. Pt states pain has been worse since receiving and epidural a week ago.

## 2018-05-29 ENCOUNTER — Observation Stay (HOSPITAL_COMMUNITY): Payer: Medicare Other

## 2018-05-29 DIAGNOSIS — N200 Calculus of kidney: Secondary | ICD-10-CM

## 2018-05-29 DIAGNOSIS — M4854XA Collapsed vertebra, not elsewhere classified, thoracic region, initial encounter for fracture: Secondary | ICD-10-CM | POA: Diagnosis not present

## 2018-05-29 DIAGNOSIS — S22080A Wedge compression fracture of T11-T12 vertebra, initial encounter for closed fracture: Secondary | ICD-10-CM

## 2018-05-29 DIAGNOSIS — D649 Anemia, unspecified: Secondary | ICD-10-CM

## 2018-05-29 DIAGNOSIS — D591 Other autoimmune hemolytic anemias: Secondary | ICD-10-CM

## 2018-05-29 DIAGNOSIS — D589 Hereditary hemolytic anemia, unspecified: Secondary | ICD-10-CM | POA: Diagnosis not present

## 2018-05-29 DIAGNOSIS — I6529 Occlusion and stenosis of unspecified carotid artery: Secondary | ICD-10-CM

## 2018-05-29 DIAGNOSIS — J449 Chronic obstructive pulmonary disease, unspecified: Secondary | ICD-10-CM

## 2018-05-29 DIAGNOSIS — D472 Monoclonal gammopathy: Secondary | ICD-10-CM | POA: Diagnosis not present

## 2018-05-29 DIAGNOSIS — E538 Deficiency of other specified B group vitamins: Secondary | ICD-10-CM

## 2018-05-29 DIAGNOSIS — Z87891 Personal history of nicotine dependence: Secondary | ICD-10-CM

## 2018-05-29 DIAGNOSIS — Z8781 Personal history of (healed) traumatic fracture: Secondary | ICD-10-CM

## 2018-05-29 LAB — CBC
HEMATOCRIT: 24.1 % — AB (ref 36.0–46.0)
Hemoglobin: 8.1 g/dL — ABNORMAL LOW (ref 12.0–15.0)
MCH: 32.1 pg (ref 26.0–34.0)
MCHC: 33.6 g/dL (ref 30.0–36.0)
MCV: 95.6 fL (ref 78.0–100.0)
Platelets: 390 10*3/uL (ref 150–400)
RBC: 2.52 MIL/uL — ABNORMAL LOW (ref 3.87–5.11)
RDW: 21.8 % — AB (ref 11.5–15.5)
WBC: 11.4 10*3/uL — AB (ref 4.0–10.5)

## 2018-05-29 LAB — BASIC METABOLIC PANEL
Anion gap: 10 (ref 5–15)
BUN: 23 mg/dL (ref 8–23)
CO2: 33 mmol/L — ABNORMAL HIGH (ref 22–32)
Calcium: 8.1 mg/dL — ABNORMAL LOW (ref 8.9–10.3)
Chloride: 97 mmol/L — ABNORMAL LOW (ref 98–111)
Creatinine, Ser: 0.64 mg/dL (ref 0.44–1.00)
GFR calc Af Amer: >60 mL/min (ref >60)
GLUCOSE: 119 mg/dL — AB (ref 70–99)
Potassium: 3.2 mmol/L — ABNORMAL LOW (ref 3.5–5.1)
SODIUM: 140 mmol/L (ref 135–145)

## 2018-05-29 LAB — HAPTOGLOBIN

## 2018-05-29 LAB — MAGNESIUM: Magnesium: 1.8 mg/dL (ref 1.7–2.4)

## 2018-05-29 MED ORDER — POTASSIUM CHLORIDE CRYS ER 20 MEQ PO TBCR
40.0000 meq | EXTENDED_RELEASE_TABLET | Freq: Once | ORAL | Status: AC
  Administered 2018-05-29: 40 meq via ORAL
  Filled 2018-05-29: qty 2

## 2018-05-29 MED ORDER — OXYCODONE-ACETAMINOPHEN 5-325 MG PO TABS
1.0000 | ORAL_TABLET | Freq: Four times a day (QID) | ORAL | Status: DC | PRN
  Administered 2018-05-30 – 2018-05-31 (×2): 1 via ORAL
  Filled 2018-05-29 (×2): qty 1

## 2018-05-29 NOTE — Care Management Note (Signed)
Case Management Note  Patient Details  Name: [name] MRN: [id number] Date of Birth: 1941/09/10  Subjective/Objective:  Pt admitted with Symptomatic anemia                 Action/Plan: Pt plan to go to her mother's home at discharge.    Expected Discharge Date:  (unknown)               Expected Discharge Plan:  [place]  In-House Referral:     Discharge planning Services  CM Consult  Post Acute Care Choice:    Choice offered to:     DME Arranged:    DME Agency:     HH Arranged:    HH Agency:     Status of Service:  In process, will continue to follow  If discussed at Long Length of Stay Meetings, dates discussed:    Additional CommentsPurcell Mouton, RN 05/29/2018, 4:07 PM

## 2018-05-29 NOTE — Care Management Obs Status (Signed)
[place] NOTIFICATION   Patient Details  Name: [name] MRN: [id number] Date of Birth: April 24, 1941   Medicare Observation Status Notification Given:   yes    Purcell Mouton, RN 05/29/2018, 3:19 PM

## 2018-05-29 NOTE — Progress Notes (Signed)
Patient ID: [name], female   DOB: 1940-10-01, 77 y.o.   MRN: [id number] Aware of request for kyphoplasty on patient.  Imaging studies have been reviewed by Dr. Earleen Newport.  Patient will need MRI of lumbar spine to include T12 level prior to determining candidacy for kyphoplasty-this has been ordered.  Plans briefly discussed with patient.  Will follow-up after scan completed.

## 2018-05-29 NOTE — Progress Notes (Signed)
[facility]   HEMATOLOGY/ONCOLOGY INPATIENT PROGRESS NOTE  Date of Service: 05/29/2018  Inpatient Attending: .Cristal Ford, DO   SUBJECTIVE  [name] is well-known to our hematology clinic.  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  She was admitted with worsening back pains and noted to have an acute T12 compression fracture and has previously had subacute L4-5 fractures for which she was following with Dr. Percell Miller as outpatient.  No new back pain has significantly limited her mobility. She was noted to have significant anemia on presentation though the labs were not drawn involved tubes and likely underestimated her hemoglobin levels.  She did receive 2 units of PRBCs-with blood warmer with improvement in her hemoglobin to 8.1.  She has been starting to stay warm.  No recent infections. No evidence of bleeding. Has been relatively transfusion free post Rituxan. She is aware that as the temperature gets colder she might have more hemolysis and will need closer monitoring. She has been intubated by IR and is being considered for T12 vertebroplasty which would be appropriate.    OBJECTIVE: No acute distress.  PHYSICAL EXAMINATION: . Vitals:   05/28/18 2200 05/29/18 0109 05/29/18 0644 05/29/18 1430  BP:  (!) 127/56 (!) 120/52 118/61  Pulse: 76 83  89  Resp: 16  16 18   Temp: 98.8 F (37.1 C) 99.3 F (37.4 C) 98.4 F (36.9 C) 98.7 F (37.1 C)  TempSrc: Oral Oral Oral Oral  SpO2: 97% 95% 93% 94%  Weight:      Height:       Filed Weights   05/28/18 1724  Weight: 93 lb 0.6 oz (42.2 kg)   .Body mass index is 18.17 kg/m.  GENERAL:alert, in no acute distress and comfortable EYES: conjunctival pallor, mild scleral icterus OROPHARYNX:no exudate, no erythema and lips, buccal mucosa, and tongue normal  NECK: supple, no JVD, thyroid  normal size, non-tender, without nodularity LYMPH:  no palpable lymphadenopathy in the cervical, axillary or inguinal LUNGS: clear to auscultation with normal respiratory effort HEART: regular rate & rhythm,  no murmurs and no lower extremity edema ABDOMEN: abdomen soft, non-tender, normoactive bowel sounds  Musculoskeletal: no cyanosis of digits and no clubbing  PSYCH: alert & oriented x 3 with fluent speech NEURO: no focal motor/sensory deficits  MEDICAL HISTORY:  Past Medical History:  Diagnosis Date  . B12 deficiency   . Bilateral carotid artery stenosis    right CCA and ICA 1-39%/    left ICA 40-59%  per last duplex  10-19-2015  . COPD (chronic obstructive pulmonary disease) ([place])   . Diverticulosis of colon   . History of diverticulitis of colon    2004  . 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[place])    montiored by dr Marko Plume  . Renal and ureteric calculus    left    SURGICAL HISTORY: Past Surgical History:  Procedure Laterality Date  . ABDOMINAL HYSTERECTOMY  1986  . APPENDECTOMY  1967  . BREAST EXCISIONAL BIOPSY Right 1984  . CATARACT EXTRACTION W/ INTRAOCULAR LENS IMPLANT Right 1990's   congential cataract  . CYSTOSCOPY W/ URETERAL STENT PLACEMENT  11/12/2015   Procedure: CYSTOSCOPY WITH RETROGRADE PYELOGRAM/URETERAL STENT PLACEMENT;  Surgeon: [name], MD;  Location: [facility];  Service: Urology;;  . Consuela Mimes W/ URETERAL STENT REMOVAL Left 11/22/2015   Procedure: CYSTOSCOPY WITH STENT REMOVAL;  Surgeon: [name], MD;  Location: [facility];  Service: Urology;  Laterality: Left;  . CYSTOSCOPY/URETEROSCOPY/HOLMIUM LASER Left 11/22/2015   Procedure: CYSTOSCOPY/URETEROSCOPY/HOLMIUM LASER;  Surgeon: [name], MD;  Location: [facility];  Service: Urology;  Laterality: Left;  FLEX URETEROSCOPY  . FEMUR IM NAIL Left 08/04/2015   Procedure: INTRAMEDULLARY (IM) NAIL FEMORAL;  Surgeon: [name], MD;  Location: [facility];  Service:  Orthopedics;  Laterality: Left;  . TONSILLECTOMY  age 77    SOCIAL HISTORY: Social History   Socioeconomic History  . Marital status: Divorced    Spouse name: Not on file  . Number of children: Not on file  . Years of education: Not on file  . Highest education level: Not on file  Occupational History  . Not on file  Social Needs  . Financial resource strain: Not on file  . Food insecurity:    Worry: Not on file    Inability: Not on file  . Transportation needs:    Medical: Not on file    Non-medical: Not on file  Tobacco Use  . Smoking status: Former Smoker    Packs/day: 1.00    Types: Cigarettes    Last attempt to quit: 08/03/2015    Years since quitting: 2.8  . Smokeless tobacco: Never Used  Substance and Sexual Activity  . Alcohol use: Yes    Comment: occ  . Drug use: Not on file  . Sexual activity: Not on file  Lifestyle  . Physical activity:    Days per week: Not on file    Minutes per session: Not on file  . Stress: Not on file  Relationships  . Social connections:    Talks on phone: Not on file    Gets together: Not on file    Attends religious service: Not on file    Active member of club or organization: Not on file    Attends meetings of clubs or organizations: Not on file    Relationship status: Not on file  . Intimate partner violence:    Fear of current or ex partner: Not on file    Emotionally abused: Not on file    Physically abused: Not on file    Forced sexual activity: Not on file  Other Topics Concern  . Not on file  Social History Narrative  . Not on file    FAMILY HISTORY: Family History  Problem Relation Age of Onset  . Breast cancer Cousin     ALLERGIES:  is allergic to other and shellfish allergy.  MEDICATIONS:  Scheduled Meds: . folic acid  1 mg Oral Daily  . multivitamin with minerals  1 tablet Oral Daily  . senna-docusate  2 tablet Oral BID  . sodium chloride flush  3 mL Intravenous Q12H   Continuous Infusions: .  sodium chloride     PRN Meds:.sodium chloride, acetaminophen **OR** acetaminophen, albuterol, ondansetron **OR** ondansetron (ZOFRAN) IV, oxyCODONE-acetaminophen, polyethylene glycol, sodium chloride flush, traZODone  REVIEW OF SYSTEMS:    10 Point review of Systems was done is negative except as noted above.   LABORATORY DATA:  I have reviewed the data as listed  . CBC Latest Ref Rng & Units 05/29/2018 05/28/2018 04/17/2018  WBC 4.0 - 10.5 K/uL 11.4(H) 10.4 14.4(H)  Hemoglobin 12.0 - 15.0 g/dL 8.1(L) 4.8(LL) 8.2(L)  Hematocrit 36.0 - 46.0 % 24.1(L) 14.6(L) 23.8(L)  Platelets 150 - 400 K/uL 390 612(H) 616(H)    . CMP Latest Ref Rng & Units 05/29/2018 05/28/2018 04/17/2018  Glucose  70 - 99 mg/dL 119(H) 117(H) 86  BUN 8 - 23 mg/dL 23 25(H) 12  Creatinine 0.44 - 1.00 mg/dL 0.64 0.76 0.68  Sodium 135 - 145 mmol/L 140 139 142  Potassium 3.5 - 5.1 mmol/L 3.2(L) 4.0 2.9(LL)  Chloride 98 - 111 mmol/L 97(L) 96(L) 101  CO2 22 - 32 mmol/L 33(H) 31 30  Calcium 8.9 - 10.3 mg/dL 8.1(L) 8.6(L) 9.2  Total Protein 6.5 - 8.1 g/dL - 6.6 7.0  Total Bilirubin 0.3 - 1.2 mg/dL - 4.4(H) 2.8(H)  Alkaline Phos 38 - 126 U/L - 101 161(H)  AST 15 - 41 U/L - 21 17  ALT 0 - 44 U/L - 10 10   Component     Latest Ref Rng & Units 05/28/2018  Retic Ct Pct     0.4 - 3.1 % 14.3 (H)  RBC.     3.87 - 5.11 MIL/uL 1.23 (L)  Retic Count, Absolute     19.0 - 186.0 K/uL 175.9  LDH     98 - 192 U/L 898 (H)  Haptoglobin     34 - 200 mg/dL <10 (L)    RADIOGRAPHIC STUDIES: I have personally reviewed the radiological images as listed and agreed with the findings in the report. Dg Chest 2 View  Result Date: 05/28/2018 CLINICAL DATA:  Hypoxia. EXAM: CHEST - 2 VIEW COMPARISON:  06/12/2016 and prior radiographs FINDINGS: UPPER limits normal heart size again noted. Mild peribronchial thickening is unchanged. LEFT basilar noted. No definite pleural effusion or pneumothorax. No acute bony abnormalities are present. IMPRESSION:  LEFT basilar opacity which may represent airspace disease or atelectasis. Radiographic follow-up to resolution recommended. Electronically Signed   By: Margarette Canada M.D.   On: 05/28/2018 12:49   Ct Angio Chest Pe W And/or Wo Contrast  Result Date: 05/28/2018 CLINICAL DATA:  "Pt complains of lower back pain and left hip pain. Pt states pain has been worse since receiving and epidural a week ago " EXAM: CT ANGIOGRAPHY CHEST CT ABDOMEN AND PELVIS WITH CONTRAST TECHNIQUE: Multidetector CT imaging of the chest was performed using the standard protocol during bolus administration of intravenous contrast. Multiplanar CT image reconstructions and MIPs were obtained to evaluate the vascular anatomy. Multidetector CT imaging of the abdomen and pelvis was performed using the standard protocol during bolus administration of intravenous contrast. CONTRAST:  132mL ISOVUE-370 IOPAMIDOL (ISOVUE-370) INJECTION 76% COMPARISON:  02/04/2018 FINDINGS: CTA CHEST FINDINGS Cardiovascular: The heart size is UPPER normal. No significant coronary artery calcifications. No pericardial effusion. There is atherosclerotic calcification of the thoracic aorta not associated with aneurysm. The appearance of the pulmonary arteries is unremarkable. The arteries are well opacified and there is no acute pulmonary embolus. Mediastinum/Nodes: The visualized portion of the thyroid gland has a normal appearance. The esophagus is normal in appearance. No mediastinal, hilar, or axillary adenopathy. Lungs/Pleura: There are small bilateral pleural effusions. There is bibasilar atelectasis. Scattered emphysematous changes are identified primarily in the UPPER lobes. Airways are patent. No focal consolidations. Within the RIGHT UPPER lobe there is a spiculated 6 millimeter mass best seen on image 47 of series 4 a 4 millimeter nodule is identified in the RIGHT UPPER lobe on image 64 of series 4. Musculoskeletal: Remote sternal fracture. Degenerative changes are  identified in the thoracic spine. Acute compression fracture of T12. Review of the MIP images confirms the above findings. CT ABDOMEN and PELVIS FINDINGS Hepatobiliary: The liver is homogeneous. There is mild periportal edema. No intra or extrahepatic biliary duct dilatation.  Gallbladder contains several calcified stones. No pericholecystic fluid. Pancreas: Atrophic and otherwise normal appearance of the pancreas. Spleen: Normal in size without focal abnormality. Adrenals/Urinary Tract: Adrenal glands are normal in appearance. There is symmetric enhancement and excretion of the kidneys. The bladder and visualized portion of the urethra are normal. Stomach/Bowel: The stomach and bowel loops are normal in appearance. There are numerous colonic diverticula. There is thickening of the sigmoid segment which may be related to chronic diverticulosis. No associated inflammatory changes to indicate presence of acute diverticulitis. Vascular/Lymphatic: There is atherosclerotic calcification of the abdominal aorta not associated with aneurysm. Although involved by atherosclerosis, there is vascular opacification of the celiac axis, superior mesenteric artery, and inferior mesenteric artery. Normal appearance of the portal venous system and inferior vena cava. No retroperitoneal or mesenteric adenopathy. CT Reproductive: The uterus is surgically absent.  No adnexal mass. Other: No free pelvic fluid. Anterior abdominal wall is unremarkable. Musculoskeletal: Acute superior endplate fracture of Y18. Chronic anterior wedge deformities of T11. Subacute compression fractures of L3, L4, and L5. Remote ORIF of the LEFT hip. No joint effusion or evidence for dislocation. Review of the MIP images confirms the above findings. IMPRESSION: 1. Acute fracture of T12. 2. No acute pulmonary embolus. 3. Spiculated pulmonary nodule in the RIGHT UPPER lobe measuring 6 millimeters. Morphology is suspicious and further evaluation is recommended.  Recommend noncontrast CT of the chest in 3 months. 4.  Emphysema (ICD10-J43.9). 5. Subacute or chronic fractures of T11, L3, L4, and L5. Remote sternal fracture. 6. Mild periportal edema of uncertain etiology. 7. Colonic diverticulosis. Thickening of the sigmoid segment of the colon may be related to chronic diverticulosis. There is no evidence for acute inflammatory change. 8. Hysterectomy. These results were called by telephone at the time of interpretation on 05/28/2018 at 3:15 pm to Dr. Rodell Perna , who verbally acknowledged these results. Electronically Signed   By: Nolon Nations M.D.   On: 05/28/2018 15:17   Mr Lumbar Spine Wo Contrast  Result Date: 05/29/2018 CLINICAL DATA:  Multiple fractures.  Assess for kyphoplasty. EXAM: MRI LUMBAR SPINE WITHOUT CONTRAST TECHNIQUE: Multiplanar, multisequence MR imaging of the lumbar spine was performed. No intravenous contrast was administered. COMPARISON:  CT lumbar 05/28/2018, MRI lumbar 01/29/2018 FINDINGS: Segmentation:  Normal Alignment:  Normal Vertebrae: Mild superior endplate fracture H63 with bone marrow edema. This appears acute based on MRI and CT. Mild retropulsion into the canal without significant stenosis. Chronic fracture T11 Mild fracture of L3 vertebral body appears subacute Moderate subacute fracture of L4 Moderate subacute fracture of L5 Bone marrow is diffusely abnormal with low signal on T1 and T2. No focal lesion. Conus medullaris and cauda equina: Conus extends to the L1-2 level. Conus and cauda equina appear normal. Paraspinal and other soft tissues: Negative for paraspinous mass. Mild paraspinous soft tissue thickening/hematoma related to the T12 fracture. Disc levels: T11-12: Mild retropulsion of T12 into the canal due to fracture without significant stenosis T12-L1: Negative L1-2: Negative L2-3: Mild disc and mild facet degeneration without significant stenosis. L3-4: Disc degeneration with diffuse disc bulging and spurring. Bilateral facet  hypertrophy. Mild spinal stenosis and mild subarticular stenosis bilaterally L4-5: Disc degeneration and endplate spurring. Bilateral facet hypertrophy. Mild spinal stenosis L5-S1: Mild disc and mild facet degeneration without stenosis. IMPRESSION: Mild superior endplate fracture J49 appears acute. Mild retropulsion of bone into the canal without significant stenosis Chronic fracture T11 Subacute fractures of L3, L4, and L5 unchanged from recent  CT. Diffuse bone marrow abnormality. Question  anemia or myeloproliferative disorder. Correlate with CBC. Electronically Signed   By: Franchot Gallo M.D.   On: 05/29/2018 11:31   Ct Abdomen Pelvis W Contrast  Result Date: 05/28/2018 CLINICAL DATA:  "Pt complains of lower back pain and left hip pain. Pt states pain has been worse since receiving and epidural a week ago " EXAM: CT ANGIOGRAPHY CHEST CT ABDOMEN AND PELVIS WITH CONTRAST TECHNIQUE: Multidetector CT imaging of the chest was performed using the standard protocol during bolus administration of intravenous contrast. Multiplanar CT image reconstructions and MIPs were obtained to evaluate the vascular anatomy. Multidetector CT imaging of the abdomen and pelvis was performed using the standard protocol during bolus administration of intravenous contrast. CONTRAST:  144mL ISOVUE-370 IOPAMIDOL (ISOVUE-370) INJECTION 76% COMPARISON:  02/04/2018 FINDINGS: CTA CHEST FINDINGS Cardiovascular: The heart size is UPPER normal. No significant coronary artery calcifications. No pericardial effusion. There is atherosclerotic calcification of the thoracic aorta not associated with aneurysm. The appearance of the pulmonary arteries is unremarkable. The arteries are well opacified and there is no acute pulmonary embolus. Mediastinum/Nodes: The visualized portion of the thyroid gland has a normal appearance. The esophagus is normal in appearance. No mediastinal, hilar, or axillary adenopathy. Lungs/Pleura: There are small bilateral  pleural effusions. There is bibasilar atelectasis. Scattered emphysematous changes are identified primarily in the UPPER lobes. Airways are patent. No focal consolidations. Within the RIGHT UPPER lobe there is a spiculated 6 millimeter mass best seen on image 47 of series 4 a 4 millimeter nodule is identified in the RIGHT UPPER lobe on image 64 of series 4. Musculoskeletal: Remote sternal fracture. Degenerative changes are identified in the thoracic spine. Acute compression fracture of T12. Review of the MIP images confirms the above findings. CT ABDOMEN and PELVIS FINDINGS Hepatobiliary: The liver is homogeneous. There is mild periportal edema. No intra or extrahepatic biliary duct dilatation. Gallbladder contains several calcified stones. No pericholecystic fluid. Pancreas: Atrophic and otherwise normal appearance of the pancreas. Spleen: Normal in size without focal abnormality. Adrenals/Urinary Tract: Adrenal glands are normal in appearance. There is symmetric enhancement and excretion of the kidneys. The bladder and visualized portion of the urethra are normal. Stomach/Bowel: The stomach and bowel loops are normal in appearance. There are numerous colonic diverticula. There is thickening of the sigmoid segment which may be related to chronic diverticulosis. No associated inflammatory changes to indicate presence of acute diverticulitis. Vascular/Lymphatic: There is atherosclerotic calcification of the abdominal aorta not associated with aneurysm. Although involved by atherosclerosis, there is vascular opacification of the celiac axis, superior mesenteric artery, and inferior mesenteric artery. Normal appearance of the portal venous system and inferior vena cava. No retroperitoneal or mesenteric adenopathy. CT Reproductive: The uterus is surgically absent.  No adnexal mass. Other: No free pelvic fluid. Anterior abdominal wall is unremarkable. Musculoskeletal: Acute superior endplate fracture of V76. Chronic  anterior wedge deformities of T11. Subacute compression fractures of L3, L4, and L5. Remote ORIF of the LEFT hip. No joint effusion or evidence for dislocation. Review of the MIP images confirms the above findings. IMPRESSION: 1. Acute fracture of T12. 2. No acute pulmonary embolus. 3. Spiculated pulmonary nodule in the RIGHT UPPER lobe measuring 6 millimeters. Morphology is suspicious and further evaluation is recommended. Recommend noncontrast CT of the chest in 3 months. 4.  Emphysema (ICD10-J43.9). 5. Subacute or chronic fractures of T11, L3, L4, and L5. Remote sternal fracture. 6. Mild periportal edema of uncertain etiology. 7. Colonic diverticulosis. Thickening of the sigmoid segment of the colon may  be related to chronic diverticulosis. There is no evidence for acute inflammatory change. 8. Hysterectomy. These results were called by telephone at the time of interpretation on 05/28/2018 at 3:15 pm to Dr. Rodell Perna , who verbally acknowledged these results. Electronically Signed   By: Nolon Nations M.D.   On: 05/28/2018 15:17   Ct L-spine No Charge  Result Date: 05/28/2018 CLINICAL DATA:  Low back pain EXAM: CT LUMBAR SPINE WITHOUT CONTRAST TECHNIQUE: Multidetector CT imaging of the lumbar spine was performed without intravenous contrast administration. Multiplanar CT image reconstructions were also generated. COMPARISON:  Lumbar MRI 01/29/2018 FINDINGS: Segmentation: Normal Alignment: Normal Vertebrae: Chronic compression fracture T11 unchanged -Mild fracture superior endplate of V40 appears acute -Inferior endplate compression fracture of L3 is new since the prior study. Sclerotic bone changes is present suggesting healing from recent fracture -Moderately severe compression fracture of L4 has progressed since the prior study with evidence of bone healing -Moderate compression fracture L5 is new since the prior MRI and shows evidence of healing with sclerotic changes. Paraspinal and other soft tissues:  Bibasilar atelectasis. Atherosclerotic aorta. No paraspinous soft tissue swelling. Disc levels: T12-L1: Negative L1-2: Negative L2-3: Mild disc bulging and mild facet degeneration without significant stenosis L3-4: Disc degeneration and disc bulging with spurring. Bilateral mild facet degeneration. No significant stenosis L4-5: Disc degeneration with disc bulging and endplate spurring. Mild facet degeneration. No significant spinal stenosis L5-S1: Mild disc degeneration without stenosis. IMPRESSION: Acute compression fracture of T12 Subacute compression fractures of L3, L4, and L5. Chronic fracture of T11 Multilevel degenerative changes in the lumbar spine without significant stenosis. Atherosclerotic aorta Electronically Signed   By: Franchot Gallo M.D.   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 Her hemoglobin tends to be lower during winter and has been in the 6-8 range. Overall stable hemoglobin levels in the 6-7 range with folic acid replacement and cold avoidance.   Status post treatment with weekly Rituxan x4 from 02/27/2018 through 03/20/2018.  #2 IgM lambda MGUS - cannot rule out possibility of lymphoplasmacytic lymphoma though less likely given chronicity of her condition.  #3 history of previous B12 deficiency  Plan:  -hgb 4.8 on admission -likely underestimated since it was not collected and prewarmed tubes. -Hemoglobin significantly better at 8.1 posttransfusion. -I have suggest ongoing hemolysis-hopefully at baseline. -Continue to avoid cold aggressively especially in the winter months.  -Pt needs a transfusion to keep Hgb at least above 6 -ALL TRANSFUSION PRODUCTS SHOULD BE PRE-WARMED USING A BLOOD WARMER INCLUDING IV FLUIDS, IF ANY. -Continue folic acid and G86 replacement  -Discussed pt labwork today  #5. Low back pain -acute compression fracture T12 and other multilevel subacute compression  fractures. Follows with Dr. Percell Miller from orthopedics as outpatient. Plan -IR consulted and are considering vertebroplasty of T12 for symptom control. -f/u with PCP and ortho for mx of the bone issues   I spent 25 minutes counseling the patient face to face. The total time spent in the appointment was 35 minutes and more than 50% was on counseling and direct patient cares.    Sullivan Lone MD [place] AAHIVMS [facility] Hematology/Oncology Physician [facility]  (Office):       [phone] (Work cell):  [phone] (Fax):           [phone]  05/29/2018 5:51 PM

## 2018-05-29 NOTE — Consult Note (Signed)
Reason for Consult: Significant back pain Referring Physician: Hospitalist  [name] is an 77 y.o. female.  HPI: The patient is a pleasant 77 year old female who is not the best historian who states that she has had long-standing back pain since a fall several years ago.  She has had exacerbations and remissions of pain over time but is always had some ongoing pain.  Recently she has had worsening of this pain and difficulty getting out of bed.  She comes in for evaluation of other medical issues but with significant back pain which is limiting her ability to get up and move.  We are consulted for evaluation of her back pain.  She had a CAT scan which showed that she had a new T12 anterior wedge compression fracture.  Past Medical History:  Diagnosis Date  . B12 deficiency   . Bilateral carotid artery stenosis    right CCA and ICA 1-39%/    left ICA 40-59%  per last duplex  10-19-2015  . COPD (chronic obstructive pulmonary disease) ([place])   . Diverticulosis of colon   . History of diverticulitis of colon    2004  . 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 Renal and ureteric calculus    left    Past Surgical History:  Procedure Laterality Date  . ABDOMINAL HYSTERECTOMY  1986  . APPENDECTOMY  1967  . BREAST EXCISIONAL BIOPSY Right 1984  . CATARACT EXTRACTION W/ INTRAOCULAR LENS IMPLANT Right 1990's   congential cataract  . CYSTOSCOPY W/ URETERAL STENT PLACEMENT  11/12/2015   Procedure: CYSTOSCOPY WITH RETROGRADE PYELOGRAM/URETERAL STENT PLACEMENT;  Surgeon: [name], MD;  Location: [facility];  Service: Urology;;  . [name] W/ URETERAL STENT REMOVAL Left 11/22/2015   Procedure: CYSTOSCOPY WITH STENT REMOVAL;  Surgeon: [name], MD;  Location: [facility];  Service: Urology;  Laterality: Left;  . CYSTOSCOPY/URETEROSCOPY/HOLMIUM LASER Left 11/22/2015   Procedure: CYSTOSCOPY/URETEROSCOPY/HOLMIUM LASER;  Surgeon: [name], MD;  Location: [facility];  Service: Urology;  Laterality: Left;  FLEX URETEROSCOPY  . FEMUR IM NAIL Left 08/04/2015   Procedure: INTRAMEDULLARY (IM) NAIL FEMORAL;  Surgeon: [name], MD;  Location: [place];  Service: Orthopedics;  Laterality: Left;  . TONSILLECTOMY  age 39    Family History  Problem Relation Age of Onset  . Breast cancer Cousin     Social History:  reports that she quit smoking about 2 years ago. Her smoking use included cigarettes. She smoked 1.00 pack per day. She has never used smokeless tobacco. She reports that she drinks alcohol. Her drug history is not on file.  Allergies:  Allergies  Allergen Reactions  . Other Other (See Comments)    "Most antibiotics give yeast infection"  . Shellfish Allergy Hives    Medications: I have reviewed the patient's current medications.  Results for orders placed or performed during the hospital encounter of 05/28/18 (from the past 48 hour(s))  Comprehensive metabolic panel     Status: Abnormal   Collection Time: 05/28/18 11:36 AM  Result Value Ref Range   Sodium 139 135 - 145 mmol/L   Potassium 4.0 3.5 - 5.1 mmol/L   Chloride 96 (L) 98 - 111 mmol/L   CO2 31 22 - 32 mmol/L   Glucose, Bld 117 (H) 70 - 99 mg/dL   BUN 25 (H) 8 - 23 mg/dL   Creatinine, Ser 0.76 0.44 - 1.00 mg/dL   Calcium 8.6 (  L) 8.9 - 10.3 mg/dL   Total Protein 6.6 6.5 - 8.1 g/dL   Albumin 3.7 3.5 - 5.0 g/dL   AST 21 15 - 41 U/L   ALT 10 0 - 44 U/L   Alkaline Phosphatase 101 38 - 126 U/L   Total Bilirubin 4.4 (H) 0.3 - 1.2 mg/dL   GFR calc non Af Amer >60 >60 mL/min   GFR calc Af Amer >60 >60 mL/min    Comment: (NOTE) The eGFR has been calculated using the CKD EPI equation. This calculation has not been validated in all clinical situations. eGFR's persistently <60 mL/min signify possible Chronic Kidney Disease.    Anion gap 12 5 - 15    Comment: Performed at [facility], [place] [address]  [place], [place] [address]  CBC with Differential     Status: Abnormal   Collection Time: 05/28/18 11:36 AM  Result Value Ref Range   WBC 10.4 4.0 - 10.5 K/uL   RBC 1.34 (L) 3.87 - 5.11 MIL/uL   Hemoglobin 4.8 (LL) 12.0 - 15.0 g/dL    Comment: REPEATED TO VERIFY CRITICAL RESULT CALLED TO, READ BACK BY AND VERIFIED WITH: M.FAWZE AT 1448 ON 05/28/18 BY N.THOMPSON    HCT 14.6 (L) 36.0 - 46.0 %   MCV 109.0 (H) 78.0 - 100.0 fL   MCH 35.8 (H) 26.0 - 34.0 pg   MCHC 32.9 30.0 - 36.0 g/dL    Comment: CORRECTED FOR COLD AGGLUTININS   RDW 19.5 (H) 11.5 - 15.5 %   Platelets 612 (H) 150 - 400 K/uL   Neutrophils Relative % 71 %   Lymphocytes Relative 18 %   Monocytes Relative 11 %   Eosinophils Relative 0 %   Basophils Relative 0 %   Neutro Abs 7.4 1.7 - 7.7 K/uL   Lymphs Abs 1.9 0.7 - 4.0 K/uL   Monocytes Absolute 1.1 (H) 0.1 - 1.0 K/uL   Eosinophils Absolute 0.0 0.0 - 0.7 K/uL   Basophils Absolute 0.0 0.0 - 0.1 K/uL   RBC Morphology POLYCHROMASIA PRESENT     Comment: AGGLUTINATION BASOPHILIC STIPPLING RARE NRBCs    WBC Morphology MILD LEFT SHIFT (1-5% METAS, OCC MYELO, OCC BANDS)     Comment: TOXIC GRANULATION VACUOLATED NEUTROPHILS Performed at [facility], [place] [address], [place], [place] [address]   Type and screen [place]     Status: None (Preliminary result)   Collection Time: 05/28/18  3:30 PM  Result Value Ref Range   ABO/RH(D) B POS    Antibody Screen NEG    Sample Expiration 05/31/2018    Antibody Identification ANTI I    DAT, IgG NEG    Unit Number [id number]    Blood Component Type RED CELLS,LR    Unit division 00    Status of Unit ISSUED,FINAL    Transfusion Status OK TO TRANSFUSE    Crossmatch Result COMPATIBLE    Unit Number [id number]    Blood Component Type RED CELLS,LR    Unit division 00    Status of Unit ISSUED,FINAL    Transfusion Status OK TO TRANSFUSE    Crossmatch Result COMPATIBLE    Unit Number  [id number]    Blood Component Type RED CELLS,LR    Unit division 00    Status of Unit ALLOCATED    Transfusion Status OK TO TRANSFUSE    Crossmatch Result COMPATIBLE    Unit Number [id number]    Blood Component Type RED CELLS,LR  Unit division 00    Status of Unit ALLOCATED    Transfusion Status OK TO TRANSFUSE    Crossmatch Result COMPATIBLE   Prepare RBC     Status: None   Collection Time: 05/28/18  3:30 PM  Result Value Ref Range   Order Confirmation      ORDER PROCESSED BY BLOOD BANK Performed at [facility], [place] [address], [place], [place] [address]   I-Stat Troponin, ED (not at [facility])     Status: None   Collection Time: 05/28/18  3:37 PM  Result Value Ref Range   Troponin i, poc 0.03 0.00 - 0.08 ng/mL   Comment 3            Comment: Due to the release kinetics of cTnI, a negative result within the first hours of the onset of symptoms does not rule out myocardial infarction with certainty. If myocardial infarction is still suspected, repeat the test at appropriate intervals.   Lactate dehydrogenase     Status: Abnormal   Collection Time: 05/28/18  5:43 PM  Result Value Ref Range   LDH 898 (H) 98 - 192 U/L    Comment: Performed at [facility], [place] [address], [place], [place] [address]  Reticulocytes     Status: Abnormal   Collection Time: 05/28/18  5:43 PM  Result Value Ref Range   Retic Ct Pct 14.3 (H) 0.4 - 3.1 %   RBC. 1.23 (L) 3.87 - 5.11 MIL/uL   Retic Count, Absolute 175.9 19.0 - 186.0 K/uL    Comment: Performed at [facility], [place] [address] [place], [place] [address]  Haptoglobin     Status: Abnormal   Collection Time: 05/28/18  5:43 PM  Result Value Ref Range   Haptoglobin <10 (L) 34 - 200 mg/dL    Comment: (NOTE) Performed At: [facility] [place], [place] [id number] Rush Farmer MD [id number]   Urinalysis, Routine w reflex microscopic     Status: Abnormal    Collection Time: 05/28/18  8:55 PM  Result Value Ref Range   Color, Urine YELLOW YELLOW   APPearance CLEAR CLEAR   Specific Gravity, Urine >1.046 (H) 1.005 - 1.030   pH 6.0 5.0 - 8.0   Glucose, UA NEGATIVE NEGATIVE mg/dL   Hgb urine dipstick SMALL (A) NEGATIVE   Bilirubin Urine NEGATIVE NEGATIVE   Ketones, ur NEGATIVE NEGATIVE mg/dL   Protein, ur NEGATIVE NEGATIVE mg/dL   Nitrite NEGATIVE NEGATIVE   Leukocytes, UA NEGATIVE NEGATIVE   RBC / HPF 0-5 0 - 5 RBC/hpf   WBC, UA 0-5 0 - 5 WBC/hpf   Bacteria, UA NONE SEEN NONE SEEN   Squamous Epithelial / LPF 0-5 0 - 5    Comment: Performed at [facility], [place] [address], [place], [place] [address]  Basic metabolic panel     Status: Abnormal   Collection Time: 05/29/18  4:11 AM  Result Value Ref Range   Sodium 140 135 - 145 mmol/L   Potassium 3.2 (L) 3.5 - 5.1 mmol/L    Comment: DELTA CHECK NOTED REPEATED TO VERIFY    Chloride 97 (L) 98 - 111 mmol/L   CO2 33 (H) 22 - 32 mmol/L   Glucose, Bld 119 (H) 70 - 99 mg/dL   BUN 23 8 - 23 mg/dL   Creatinine, Ser 0.64 0.44 - 1.00 mg/dL   Calcium 8.1 (L) 8.9 - 10.3 mg/dL   GFR calc non Af Amer >  60 >60 mL/min   GFR calc Af Amer >60 >60 mL/min    Comment: (NOTE) The eGFR has been calculated using the CKD EPI equation. This calculation has not been validated in all clinical situations. eGFR's persistently <60 mL/min signify possible Chronic Kidney Disease.    Anion gap 10 5 - 15    Comment: Performed at [facility], [place] [address], [place], [place] [address]  CBC     Status: Abnormal   Collection Time: 05/29/18  4:11 AM  Result Value Ref Range   WBC 11.4 (H) 4.0 - 10.5 K/uL   RBC 2.52 (L) 3.87 - 5.11 MIL/uL   Hemoglobin 8.1 (L) 12.0 - 15.0 g/dL    Comment: DELTA CHECK NOTED POST TRANSFUSION SPECIMEN    HCT 24.1 (L) 36.0 - 46.0 %   MCV 95.6 78.0 - 100.0 fL    Comment: DELTA CHECK NOTED POST TRANSFUSION SPECIMEN    MCH 32.1 26.0 - 34.0 pg    MCHC 33.6 30.0 - 36.0 g/dL   RDW 21.8 (H) 11.5 - 15.5 %   Platelets 390 150 - 400 K/uL    Comment: Performed at [facility], [place] [address] [place], [place] [address]  Magnesium     Status: None   Collection Time: 05/29/18  4:11 AM  Result Value Ref Range   Magnesium 1.8 1.7 - 2.4 mg/dL    Comment: Performed at [facility], [place] [address], [place], [place] [address]    Dg Chest 2 View  Result Date: 05/28/2018 CLINICAL DATA:  Hypoxia. EXAM: CHEST - 2 VIEW COMPARISON:  06/12/2016 and prior radiographs FINDINGS: UPPER limits normal heart size again noted. Mild peribronchial thickening is unchanged. LEFT basilar noted. No definite pleural effusion or pneumothorax. No acute bony abnormalities are present. IMPRESSION: LEFT basilar opacity which may represent airspace disease or atelectasis. Radiographic follow-up to resolution recommended. Electronically Signed   By: Margarette Canada M.D.   On: 05/28/2018 12:49   Ct Angio Chest Pe W And/or Wo Contrast  Result Date: 05/28/2018 CLINICAL DATA:  "Pt complains of lower back pain and left hip pain. Pt states pain has been worse since receiving and epidural a week ago " EXAM: CT ANGIOGRAPHY CHEST CT ABDOMEN AND PELVIS WITH CONTRAST TECHNIQUE: Multidetector CT imaging of the chest was performed using the standard protocol during bolus administration of intravenous contrast. Multiplanar CT image reconstructions and MIPs were obtained to evaluate the vascular anatomy. Multidetector CT imaging of the abdomen and pelvis was performed using the standard protocol during bolus administration of intravenous contrast. CONTRAST:  137m ISOVUE-370 IOPAMIDOL (ISOVUE-370) INJECTION 76% COMPARISON:  02/04/2018 FINDINGS: CTA CHEST FINDINGS Cardiovascular: The heart size is UPPER normal. No significant coronary artery calcifications. No pericardial effusion. There is atherosclerotic calcification of the thoracic aorta not associated with  aneurysm. The appearance of the pulmonary arteries is unremarkable. The arteries are well opacified and there is no acute pulmonary embolus. Mediastinum/Nodes: The visualized portion of the thyroid gland has a normal appearance. The esophagus is normal in appearance. No mediastinal, hilar, or axillary adenopathy. Lungs/Pleura: There are small bilateral pleural effusions. There is bibasilar atelectasis. Scattered emphysematous changes are identified primarily in the UPPER lobes. Airways are patent. No focal consolidations. Within the RIGHT UPPER lobe there is a spiculated 6 millimeter mass best seen on image 47 of series 4 a 4 millimeter nodule is identified in the RIGHT UPPER lobe on image 64 of series 4. Musculoskeletal: Remote sternal fracture. Degenerative changes  are identified in the thoracic spine. Acute compression fracture of T12. Review of the MIP images confirms the above findings. CT ABDOMEN and PELVIS FINDINGS Hepatobiliary: The liver is homogeneous. There is mild periportal edema. No intra or extrahepatic biliary duct dilatation. Gallbladder contains several calcified stones. No pericholecystic fluid. Pancreas: Atrophic and otherwise normal appearance of the pancreas. Spleen: Normal in size without focal abnormality. Adrenals/Urinary Tract: Adrenal glands are normal in appearance. There is symmetric enhancement and excretion of the kidneys. The bladder and visualized portion of the urethra are normal. Stomach/Bowel: The stomach and bowel loops are normal in appearance. There are numerous colonic diverticula. There is thickening of the sigmoid segment which may be related to chronic diverticulosis. No associated inflammatory changes to indicate presence of acute diverticulitis. Vascular/Lymphatic: There is atherosclerotic calcification of the abdominal aorta not associated with aneurysm. Although involved by atherosclerosis, there is vascular opacification of the celiac axis, superior mesenteric artery,  and inferior mesenteric artery. Normal appearance of the portal venous system and inferior vena cava. No retroperitoneal or mesenteric adenopathy. CT Reproductive: The uterus is surgically absent.  No adnexal mass. Other: No free pelvic fluid. Anterior abdominal wall is unremarkable. Musculoskeletal: Acute superior endplate fracture of N46. Chronic anterior wedge deformities of T11. Subacute compression fractures of L3, L4, and L5. Remote ORIF of the LEFT hip. No joint effusion or evidence for dislocation. Review of the MIP images confirms the above findings. IMPRESSION: 1. Acute fracture of T12. 2. No acute pulmonary embolus. 3. Spiculated pulmonary nodule in the RIGHT UPPER lobe measuring 6 millimeters. Morphology is suspicious and further evaluation is recommended. Recommend noncontrast CT of the chest in 3 months. 4.  Emphysema (ICD10-J43.9). 5. Subacute or chronic fractures of T11, L3, L4, and L5. Remote sternal fracture. 6. Mild periportal edema of uncertain etiology. 7. Colonic diverticulosis. Thickening of the sigmoid segment of the colon may be related to chronic diverticulosis. There is no evidence for acute inflammatory change. 8. Hysterectomy. These results were called by telephone at the time of interpretation on 05/28/2018 at 3:15 pm to Dr. Rodell Perna , who verbally acknowledged these results. Electronically Signed   By: Nolon Nations M.D.   On: 05/28/2018 15:17   Ct Abdomen Pelvis W Contrast  Result Date: 05/28/2018 CLINICAL DATA:  "Pt complains of lower back pain and left hip pain. Pt states pain has been worse since receiving and epidural a week ago " EXAM: CT ANGIOGRAPHY CHEST CT ABDOMEN AND PELVIS WITH CONTRAST TECHNIQUE: Multidetector CT imaging of the chest was performed using the standard protocol during bolus administration of intravenous contrast. Multiplanar CT image reconstructions and MIPs were obtained to evaluate the vascular anatomy. Multidetector CT imaging of the abdomen and  pelvis was performed using the standard protocol during bolus administration of intravenous contrast. CONTRAST:  12m ISOVUE-370 IOPAMIDOL (ISOVUE-370) INJECTION 76% COMPARISON:  02/04/2018 FINDINGS: CTA CHEST FINDINGS Cardiovascular: The heart size is UPPER normal. No significant coronary artery calcifications. No pericardial effusion. There is atherosclerotic calcification of the thoracic aorta not associated with aneurysm. The appearance of the pulmonary arteries is unremarkable. The arteries are well opacified and there is no acute pulmonary embolus. Mediastinum/Nodes: The visualized portion of the thyroid gland has a normal appearance. The esophagus is normal in appearance. No mediastinal, hilar, or axillary adenopathy. Lungs/Pleura: There are small bilateral pleural effusions. There is bibasilar atelectasis. Scattered emphysematous changes are identified primarily in the UPPER lobes. Airways are patent. No focal consolidations. Within the RIGHT UPPER lobe there is a spiculated  6 millimeter mass best seen on image 47 of series 4 a 4 millimeter nodule is identified in the RIGHT UPPER lobe on image 64 of series 4. Musculoskeletal: Remote sternal fracture. Degenerative changes are identified in the thoracic spine. Acute compression fracture of T12. Review of the MIP images confirms the above findings. CT ABDOMEN and PELVIS FINDINGS Hepatobiliary: The liver is homogeneous. There is mild periportal edema. No intra or extrahepatic biliary duct dilatation. Gallbladder contains several calcified stones. No pericholecystic fluid. Pancreas: Atrophic and otherwise normal appearance of the pancreas. Spleen: Normal in size without focal abnormality. Adrenals/Urinary Tract: Adrenal glands are normal in appearance. There is symmetric enhancement and excretion of the kidneys. The bladder and visualized portion of the urethra are normal. Stomach/Bowel: The stomach and bowel loops are normal in appearance. There are numerous  colonic diverticula. There is thickening of the sigmoid segment which may be related to chronic diverticulosis. No associated inflammatory changes to indicate presence of acute diverticulitis. Vascular/Lymphatic: There is atherosclerotic calcification of the abdominal aorta not associated with aneurysm. Although involved by atherosclerosis, there is vascular opacification of the celiac axis, superior mesenteric artery, and inferior mesenteric artery. Normal appearance of the portal venous system and inferior vena cava. No retroperitoneal or mesenteric adenopathy. CT Reproductive: The uterus is surgically absent.  No adnexal mass. Other: No free pelvic fluid. Anterior abdominal wall is unremarkable. Musculoskeletal: Acute superior endplate fracture of P50. Chronic anterior wedge deformities of T11. Subacute compression fractures of L3, L4, and L5. Remote ORIF of the LEFT hip. No joint effusion or evidence for dislocation. Review of the MIP images confirms the above findings. IMPRESSION: 1. Acute fracture of T12. 2. No acute pulmonary embolus. 3. Spiculated pulmonary nodule in the RIGHT UPPER lobe measuring 6 millimeters. Morphology is suspicious and further evaluation is recommended. Recommend noncontrast CT of the chest in 3 months. 4.  Emphysema (ICD10-J43.9). 5. Subacute or chronic fractures of T11, L3, L4, and L5. Remote sternal fracture. 6. Mild periportal edema of uncertain etiology. 7. Colonic diverticulosis. Thickening of the sigmoid segment of the colon may be related to chronic diverticulosis. There is no evidence for acute inflammatory change. 8. Hysterectomy. These results were called by telephone at the time of interpretation on 05/28/2018 at 3:15 pm to Dr. Rodell Perna , who verbally acknowledged these results. Electronically Signed   By: Nolon Nations M.D.   On: 05/28/2018 15:17   Ct L-spine No Charge  Result Date: 05/28/2018 CLINICAL DATA:  Low back pain EXAM: CT LUMBAR SPINE WITHOUT CONTRAST  TECHNIQUE: Multidetector CT imaging of the lumbar spine was performed without intravenous contrast administration. Multiplanar CT image reconstructions were also generated. COMPARISON:  Lumbar MRI 01/29/2018 FINDINGS: Segmentation: Normal Alignment: Normal Vertebrae: Chronic compression fracture T11 unchanged -Mild fracture superior endplate of D32 appears acute -Inferior endplate compression fracture of L3 is new since the prior study. Sclerotic bone changes is present suggesting healing from recent fracture -Moderately severe compression fracture of L4 has progressed since the prior study with evidence of bone healing -Moderate compression fracture L5 is new since the prior MRI and shows evidence of healing with sclerotic changes. Paraspinal and other soft tissues: Bibasilar atelectasis. Atherosclerotic aorta. No paraspinous soft tissue swelling. Disc levels: T12-L1: Negative L1-2: Negative L2-3: Mild disc bulging and mild facet degeneration without significant stenosis L3-4: Disc degeneration and disc bulging with spurring. Bilateral mild facet degeneration. No significant stenosis L4-5: Disc degeneration with disc bulging and endplate spurring. Mild facet degeneration. No significant spinal stenosis L5-S1: Mild  disc degeneration without stenosis. IMPRESSION: Acute compression fracture of T12 Subacute compression fractures of L3, L4, and L5. Chronic fracture of T11 Multilevel degenerative changes in the lumbar spine without significant stenosis. Atherosclerotic aorta Electronically Signed   By: Franchot Gallo M.D.   On: 05/28/2018 14:43    ROS  ROS: I have reviewed the patient's review of systems thoroughly and there are no positive responses as relates to the HPI. Blood pressure (!) 120/52, pulse 83, temperature 98.4 F (36.9 C), temperature source Oral, resp. rate 16, height 5' (1.524 m), weight 42.2 kg, SpO2 93 %. Physical Exam Well-developed well-nourished patient in no acute distress. Alert and  oriented x3 HEENT:within normal limits Cardiac: Regular rate and rhythm Pulmonary: Lungs clear to auscultation Abdomen: Soft and nontender.  Normal active bowel sounds  Musculoskeletal: (Low back: The patient is tender to palpation globally over the low back but seems to have an area of tenderness around the T12 level.  She is neurovascular intact in the lower extremities.  She has normal strength, sensation, and reflexes in the lower extremities. Assessment/Plan: 77 year old female with long history of back pain and multiple compression fractures which are subacute.  She has a new acute compression fracture at the T12 level which is hampering her ability to get up and move to some degree.//At this point vertebroplasty or kyphoplasty is really a patient choice.  She may need some help from her family to decide this.  We have asked interventional radiology to see her to have further discussion with her and to determine if they need MRI examination or have all the information they need from CT scan.  In terms of bracing certainly it could be tried but I do not think she would tolerate bracing all that well.  We will follow her while in the hospital but at this point I think interventional radiology needs to see her and decide whether they think she is a candidate for their interventional services.  Alta Corning 05/29/2018, 9:38 AM  [phone]

## 2018-05-29 NOTE — Consult Note (Signed)
Chief Complaint: Patient was seen in consultation today for T12 vertebral body augmentation Chief Complaint  Patient presents with  . Back Pain    Referring Physician(s): Mikhail,M/Graves,J  Supervising Physician: Corrie Mckusick  Patient Status: [facility] - In-pt  History of Present Illness: [name] is a 77 y.o. female with history of vitamin B12 deficiency, bilateral carotid artery stenosis, COPD, diverticulosis, chronic cold agglutinin disease , nephrolithiasis and chronic back pain who was admitted to [facility] on 9/5 for acute worsening of back pain which limited mobility along with some left hip and leg pain.  She denies recent fall. She had ORIF of left hip in 2016.  CT abdomen pelvis performed yesterday revealed acute fracture of T12 with  subacute fractures of T11, L3, L4 and L5 as well as remote sternal fracture.  MRI lumbar spine today revealed mild superior endplate fracture of Q76 with mild retropulsion of bone into the canal without significant stenosis, chronic fracture of T11 with subacute fractures of L3, L4 and L5.  There was also diffuse bone marrow abnormality.   Past Medical History:  Diagnosis Date  . B12 deficiency   . Bilateral carotid artery stenosis    right CCA and ICA 1-39%/    left ICA 40-59%  per last duplex  10-19-2015  . COPD (chronic obstructive pulmonary disease) ([place])   . Diverticulosis of colon   . History of diverticulitis of colon    2004  . 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 Renal and ureteric calculus    left    Past Surgical History:  Procedure Laterality Date  . ABDOMINAL HYSTERECTOMY  1986  . APPENDECTOMY  1967  . BREAST EXCISIONAL BIOPSY Right 1984  . CATARACT EXTRACTION W/ INTRAOCULAR LENS IMPLANT Right 1990's   congential cataract  . CYSTOSCOPY W/ URETERAL STENT PLACEMENT  11/12/2015   Procedure: CYSTOSCOPY WITH RETROGRADE PYELOGRAM/URETERAL STENT PLACEMENT;   Surgeon: [name], MD;  Location: [facility];  Service: Urology;;  . Consuela Mimes W/ URETERAL STENT REMOVAL Left 11/22/2015   Procedure: CYSTOSCOPY WITH STENT REMOVAL;  Surgeon: [name], MD;  Location: [facility];  Service: Urology;  Laterality: Left;  . CYSTOSCOPY/URETEROSCOPY/HOLMIUM LASER Left 11/22/2015   Procedure: CYSTOSCOPY/URETEROSCOPY/HOLMIUM LASER;  Surgeon: [name], MD;  Location: [facility];  Service: Urology;  Laterality: Left;  FLEX URETEROSCOPY  . FEMUR IM NAIL Left 08/04/2015   Procedure: INTRAMEDULLARY (IM) NAIL FEMORAL;  Surgeon: [name], MD;  Location: [place];  Service: Orthopedics;  Laterality: Left;  . TONSILLECTOMY  age 19    Allergies: Other and Shellfish allergy  Medications: Prior to Admission medications   Medication Sig Start Date End Date Taking? Authorizing Provider  acetaminophen (TYLENOL) 325 MG tablet Take 2 tablets (650 mg total) by mouth every 6 (six) hours as needed for mild pain (or Fever >/= 101). 08/08/15  Yes Delfina Redwood, MD  Cyanocobalamin ([employer] B-12) 1000 MCG TBCR TAKE 1 TABLET DAILY 05/22/17  Yes Brunetta Genera, MD  docusate sodium (COLACE) 100 MG capsule Take 1 capsule (100 mg total) by mouth daily. 08/08/15  Yes Delfina Redwood, MD  folic acid (FOLVITE) 1 MG tablet Take 1 tablet (1 mg total) by mouth daily. 05/22/17  Yes Brunetta Genera, MD  Tetrahydrozoline HCl (VISINE OP) Apply 1 drop to eye daily as needed (allergies/dry eyes).   Yes [provider]  potassium chloride SA (K-DUR,KLOR-CON) 20 MEQ tablet  40 meq PO twice daily for 3 days then 65meq po daily. Please repeat potassium levels with PCP in 1 month to address additional potassium. Patient not taking: Reported on 05/28/2018 04/17/18   Brunetta Genera, MD  traMADol (ULTRAM) 50 MG tablet Take 1 tablet (50 mg total) by mouth every 8 (eight) hours as needed. Patient not taking: Reported on 05/28/2018 02/18/18   Brunetta Genera, MD     Family History  Problem Relation Age of Onset  . Breast cancer Cousin     Social History   Socioeconomic History  . Marital status: Divorced    Spouse name: Not on file  . Number of children: Not on file  . Years of education: Not on file  . Highest education level: Not on file  Occupational History  . Not on file  Social Needs  . Financial resource strain: Not on file  . Food insecurity:    Worry: Not on file    Inability: Not on file  . Transportation needs:    Medical: Not on file    Non-medical: Not on file  Tobacco Use  . Smoking status: Former Smoker    Packs/day: 1.00    Types: Cigarettes    Last attempt to quit: 08/03/2015    Years since quitting: 2.8  . Smokeless tobacco: Never Used  Substance and Sexual Activity  . Alcohol use: Yes    Comment: occ  . Drug use: Not on file  . Sexual activity: Not on file  Lifestyle  . Physical activity:    Days per week: Not on file    Minutes per session: Not on file  . Stress: Not on file  Relationships  . Social connections:    Talks on phone: Not on file    Gets together: Not on file    Attends religious service: Not on file    Active member of club or organization: Not on file    Attends meetings of clubs or organizations: Not on file    Relationship status: Not on file  Other Topics Concern  . Not on file  Social History Narrative  . Not on file      Review of Systems currently denies fever, headache, chest pain, dyspnea, cough, abdominal pain, nausea, vomiting or bleeding.  Vital Signs: BP (!) 120/52 (BP Location: Right Arm)   Pulse 83   Temp 98.4 F (36.9 C) (Oral)   Resp 16   Ht 5' (1.524 m)   Wt 93 lb 0.6 oz (42.2 kg)   SpO2 93%   BMI 18.17 kg/m   Physical Exam awake, conversant.  Chest slightly diminished breath sounds bases.  Heart with regular rate and rhythm.  Abdomen soft, positive bowel sounds, nontender.  Bilateral lower extremity edema noted.  She does have some  mild/moderate paravertebral tenderness at T12 with some radiation to right flank.  Imaging: Dg Chest 2 View  Result Date: 05/28/2018 CLINICAL DATA:  Hypoxia. EXAM: CHEST - 2 VIEW COMPARISON:  06/12/2016 and prior radiographs FINDINGS: UPPER limits normal heart size again noted. Mild peribronchial thickening is unchanged. LEFT basilar noted. No definite pleural effusion or pneumothorax. No acute bony abnormalities are present. IMPRESSION: LEFT basilar opacity which may represent airspace disease or atelectasis. Radiographic follow-up to resolution recommended. Electronically Signed   By: Margarette Canada M.D.   On: 05/28/2018 12:49   Ct Angio Chest Pe W And/or Wo Contrast  Result Date: 05/28/2018 CLINICAL DATA:  "Pt complains of lower back pain  and left hip pain. Pt states pain has been worse since receiving and epidural a week ago " EXAM: CT ANGIOGRAPHY CHEST CT ABDOMEN AND PELVIS WITH CONTRAST TECHNIQUE: Multidetector CT imaging of the chest was performed using the standard protocol during bolus administration of intravenous contrast. Multiplanar CT image reconstructions and MIPs were obtained to evaluate the vascular anatomy. Multidetector CT imaging of the abdomen and pelvis was performed using the standard protocol during bolus administration of intravenous contrast. CONTRAST:  144mL ISOVUE-370 IOPAMIDOL (ISOVUE-370) INJECTION 76% COMPARISON:  02/04/2018 FINDINGS: CTA CHEST FINDINGS Cardiovascular: The heart size is UPPER normal. No significant coronary artery calcifications. No pericardial effusion. There is atherosclerotic calcification of the thoracic aorta not associated with aneurysm. The appearance of the pulmonary arteries is unremarkable. The arteries are well opacified and there is no acute pulmonary embolus. Mediastinum/Nodes: The visualized portion of the thyroid gland has a normal appearance. The esophagus is normal in appearance. No mediastinal, hilar, or axillary adenopathy. Lungs/Pleura: There  are small bilateral pleural effusions. There is bibasilar atelectasis. Scattered emphysematous changes are identified primarily in the UPPER lobes. Airways are patent. No focal consolidations. Within the RIGHT UPPER lobe there is a spiculated 6 millimeter mass best seen on image 47 of series 4 a 4 millimeter nodule is identified in the RIGHT UPPER lobe on image 64 of series 4. Musculoskeletal: Remote sternal fracture. Degenerative changes are identified in the thoracic spine. Acute compression fracture of T12. Review of the MIP images confirms the above findings. CT ABDOMEN and PELVIS FINDINGS Hepatobiliary: The liver is homogeneous. There is mild periportal edema. No intra or extrahepatic biliary duct dilatation. Gallbladder contains several calcified stones. No pericholecystic fluid. Pancreas: Atrophic and otherwise normal appearance of the pancreas. Spleen: Normal in size without focal abnormality. Adrenals/Urinary Tract: Adrenal glands are normal in appearance. There is symmetric enhancement and excretion of the kidneys. The bladder and visualized portion of the urethra are normal. Stomach/Bowel: The stomach and bowel loops are normal in appearance. There are numerous colonic diverticula. There is thickening of the sigmoid segment which may be related to chronic diverticulosis. No associated inflammatory changes to indicate presence of acute diverticulitis. Vascular/Lymphatic: There is atherosclerotic calcification of the abdominal aorta not associated with aneurysm. Although involved by atherosclerosis, there is vascular opacification of the celiac axis, superior mesenteric artery, and inferior mesenteric artery. Normal appearance of the portal venous system and inferior vena cava. No retroperitoneal or mesenteric adenopathy. CT Reproductive: The uterus is surgically absent.  No adnexal mass. Other: No free pelvic fluid. Anterior abdominal wall is unremarkable. Musculoskeletal: Acute superior endplate fracture  of S85. Chronic anterior wedge deformities of T11. Subacute compression fractures of L3, L4, and L5. Remote ORIF of the LEFT hip. No joint effusion or evidence for dislocation. Review of the MIP images confirms the above findings. IMPRESSION: 1. Acute fracture of T12. 2. No acute pulmonary embolus. 3. Spiculated pulmonary nodule in the RIGHT UPPER lobe measuring 6 millimeters. Morphology is suspicious and further evaluation is recommended. Recommend noncontrast CT of the chest in 3 months. 4.  Emphysema (ICD10-J43.9). 5. Subacute or chronic fractures of T11, L3, L4, and L5. Remote sternal fracture. 6. Mild periportal edema of uncertain etiology. 7. Colonic diverticulosis. Thickening of the sigmoid segment of the colon may be related to chronic diverticulosis. There is no evidence for acute inflammatory change. 8. Hysterectomy. These results were called by telephone at the time of interpretation on 05/28/2018 at 3:15 pm to Dr. Rodell Perna , who verbally acknowledged these results.  Electronically Signed   By: Nolon Nations M.D.   On: 05/28/2018 15:17   Mr Lumbar Spine Wo Contrast  Result Date: 05/29/2018 CLINICAL DATA:  Multiple fractures.  Assess for kyphoplasty. EXAM: MRI LUMBAR SPINE WITHOUT CONTRAST TECHNIQUE: Multiplanar, multisequence MR imaging of the lumbar spine was performed. No intravenous contrast was administered. COMPARISON:  CT lumbar 05/28/2018, MRI lumbar 01/29/2018 FINDINGS: Segmentation:  Normal Alignment:  Normal Vertebrae: Mild superior endplate fracture I69 with bone marrow edema. This appears acute based on MRI and CT. Mild retropulsion into the canal without significant stenosis. Chronic fracture T11 Mild fracture of L3 vertebral body appears subacute Moderate subacute fracture of L4 Moderate subacute fracture of L5 Bone marrow is diffusely abnormal with low signal on T1 and T2. No focal lesion. Conus medullaris and cauda equina: Conus extends to the L1-2 level. Conus and cauda equina appear  normal. Paraspinal and other soft tissues: Negative for paraspinous mass. Mild paraspinous soft tissue thickening/hematoma related to the T12 fracture. Disc levels: T11-12: Mild retropulsion of T12 into the canal due to fracture without significant stenosis T12-L1: Negative L1-2: Negative L2-3: Mild disc and mild facet degeneration without significant stenosis. L3-4: Disc degeneration with diffuse disc bulging and spurring. Bilateral facet hypertrophy. Mild spinal stenosis and mild subarticular stenosis bilaterally L4-5: Disc degeneration and endplate spurring. Bilateral facet hypertrophy. Mild spinal stenosis L5-S1: Mild disc and mild facet degeneration without stenosis. IMPRESSION: Mild superior endplate fracture G29 appears acute. Mild retropulsion of bone into the canal without significant stenosis Chronic fracture T11 Subacute fractures of L3, L4, and L5 unchanged from recent  CT. Diffuse bone marrow abnormality. Question anemia or myeloproliferative disorder. Correlate with CBC. Electronically Signed   By: Franchot Gallo M.D.   On: 05/29/2018 11:31   Ct Abdomen Pelvis W Contrast  Result Date: 05/28/2018 CLINICAL DATA:  "Pt complains of lower back pain and left hip pain. Pt states pain has been worse since receiving and epidural a week ago " EXAM: CT ANGIOGRAPHY CHEST CT ABDOMEN AND PELVIS WITH CONTRAST TECHNIQUE: Multidetector CT imaging of the chest was performed using the standard protocol during bolus administration of intravenous contrast. Multiplanar CT image reconstructions and MIPs were obtained to evaluate the vascular anatomy. Multidetector CT imaging of the abdomen and pelvis was performed using the standard protocol during bolus administration of intravenous contrast. CONTRAST:  163mL ISOVUE-370 IOPAMIDOL (ISOVUE-370) INJECTION 76% COMPARISON:  02/04/2018 FINDINGS: CTA CHEST FINDINGS Cardiovascular: The heart size is UPPER normal. No significant coronary artery calcifications. No pericardial  effusion. There is atherosclerotic calcification of the thoracic aorta not associated with aneurysm. The appearance of the pulmonary arteries is unremarkable. The arteries are well opacified and there is no acute pulmonary embolus. Mediastinum/Nodes: The visualized portion of the thyroid gland has a normal appearance. The esophagus is normal in appearance. No mediastinal, hilar, or axillary adenopathy. Lungs/Pleura: There are small bilateral pleural effusions. There is bibasilar atelectasis. Scattered emphysematous changes are identified primarily in the UPPER lobes. Airways are patent. No focal consolidations. Within the RIGHT UPPER lobe there is a spiculated 6 millimeter mass best seen on image 47 of series 4 a 4 millimeter nodule is identified in the RIGHT UPPER lobe on image 64 of series 4. Musculoskeletal: Remote sternal fracture. Degenerative changes are identified in the thoracic spine. Acute compression fracture of T12. Review of the MIP images confirms the above findings. CT ABDOMEN and PELVIS FINDINGS Hepatobiliary: The liver is homogeneous. There is mild periportal edema. No intra or extrahepatic biliary duct dilatation. Gallbladder  contains several calcified stones. No pericholecystic fluid. Pancreas: Atrophic and otherwise normal appearance of the pancreas. Spleen: Normal in size without focal abnormality. Adrenals/Urinary Tract: Adrenal glands are normal in appearance. There is symmetric enhancement and excretion of the kidneys. The bladder and visualized portion of the urethra are normal. Stomach/Bowel: The stomach and bowel loops are normal in appearance. There are numerous colonic diverticula. There is thickening of the sigmoid segment which may be related to chronic diverticulosis. No associated inflammatory changes to indicate presence of acute diverticulitis. Vascular/Lymphatic: There is atherosclerotic calcification of the abdominal aorta not associated with aneurysm. Although involved by  atherosclerosis, there is vascular opacification of the celiac axis, superior mesenteric artery, and inferior mesenteric artery. Normal appearance of the portal venous system and inferior vena cava. No retroperitoneal or mesenteric adenopathy. CT Reproductive: The uterus is surgically absent.  No adnexal mass. Other: No free pelvic fluid. Anterior abdominal wall is unremarkable. Musculoskeletal: Acute superior endplate fracture of V25. Chronic anterior wedge deformities of T11. Subacute compression fractures of L3, L4, and L5. Remote ORIF of the LEFT hip. No joint effusion or evidence for dislocation. Review of the MIP images confirms the above findings. IMPRESSION: 1. Acute fracture of T12. 2. No acute pulmonary embolus. 3. Spiculated pulmonary nodule in the RIGHT UPPER lobe measuring 6 millimeters. Morphology is suspicious and further evaluation is recommended. Recommend noncontrast CT of the chest in 3 months. 4.  Emphysema (ICD10-J43.9). 5. Subacute or chronic fractures of T11, L3, L4, and L5. Remote sternal fracture. 6. Mild periportal edema of uncertain etiology. 7. Colonic diverticulosis. Thickening of the sigmoid segment of the colon may be related to chronic diverticulosis. There is no evidence for acute inflammatory change. 8. Hysterectomy. These results were called by telephone at the time of interpretation on 05/28/2018 at 3:15 pm to Dr. Rodell Perna , who verbally acknowledged these results. Electronically Signed   By: Nolon Nations M.D.   On: 05/28/2018 15:17   Ct L-spine No Charge  Result Date: 05/28/2018 CLINICAL DATA:  Low back pain EXAM: CT LUMBAR SPINE WITHOUT CONTRAST TECHNIQUE: Multidetector CT imaging of the lumbar spine was performed without intravenous contrast administration. Multiplanar CT image reconstructions were also generated. COMPARISON:  Lumbar MRI 01/29/2018 FINDINGS: Segmentation: Normal Alignment: Normal Vertebrae: Chronic compression fracture T11 unchanged -Mild fracture  superior endplate of D66 appears acute -Inferior endplate compression fracture of L3 is new since the prior study. Sclerotic bone changes is present suggesting healing from recent fracture -Moderately severe compression fracture of L4 has progressed since the prior study with evidence of bone healing -Moderate compression fracture L5 is new since the prior MRI and shows evidence of healing with sclerotic changes. Paraspinal and other soft tissues: Bibasilar atelectasis. Atherosclerotic aorta. No paraspinous soft tissue swelling. Disc levels: T12-L1: Negative L1-2: Negative L2-3: Mild disc bulging and mild facet degeneration without significant stenosis L3-4: Disc degeneration and disc bulging with spurring. Bilateral mild facet degeneration. No significant stenosis L4-5: Disc degeneration with disc bulging and endplate spurring. Mild facet degeneration. No significant spinal stenosis L5-S1: Mild disc degeneration without stenosis. IMPRESSION: Acute compression fracture of T12 Subacute compression fractures of L3, L4, and L5. Chronic fracture of T11 Multilevel degenerative changes in the lumbar spine without significant stenosis. Atherosclerotic aorta Electronically Signed   By: Franchot Gallo M.D.   On: 05/28/2018 14:43    Labs:  CBC: Recent Labs    03/20/18 0907 04/17/18 0857 05/28/18 1136 05/29/18 0411  WBC 8.8 14.4* 10.4 11.4*  HGB 7.4* 8.2*  4.8* 8.1*  HCT 23.0* 23.8* 14.6* 24.1*  PLT 436* 616* 612* 390    COAGS: No results for input(s): INR, APTT in the last 8760 hours.  BMP: Recent Labs    03/06/18 0922 04/17/18 0857 05/28/18 1136 05/29/18 0411  NA 144 142 139 140  K 3.4* 2.9* 4.0 3.2*  CL 104 101 96* 97*  CO2 29 30 31  33*  GLUCOSE 85 86 117* 119*  BUN 8 12 25* 23  CALCIUM 8.7 9.2 8.6* 8.1*  CREATININE 0.66 0.68 0.76 0.64  GFRNONAA >60 >60 >60 >60  GFRAA >60 >60 >60 >60    LIVER FUNCTION TESTS: Recent Labs    02/27/18 0921 03/06/18 0922 04/17/18 0857 05/28/18 1136    BILITOT 2.9* 2.2* 2.8* 4.4*  AST 19 15 17 21   ALT 9 8 10 10   ALKPHOS 124 126 161* 101  PROT 7.0 6.3* 7.0 6.6  ALBUMIN 4.3 4.1 4.7 3.7    TUMOR MARKERS: No results for input(s): AFPTM, CEA, CA199, CHROMGRNA in the last 8760 hours.  Assessment and Plan: 77 y.o. female with history of vitamin B12 deficiency, bilateral carotid artery stenosis, COPD, diverticulosis, chronic cold agglutinin disease , nephrolithiasis and chronic back pain who was admitted to [facility] on 9/5 for acute worsening of back pain which limited mobility along with some left hip and leg pain.  She denies recent fall. She had ORIF of left hip in 2016.  CT abdomen pelvis performed yesterday revealed acute fracture of T12 with  subacute fractures of T11, L3, L4 and L5 as well as remote sternal fracture.  MRI lumbar spine today revealed mild superior endplate fracture of B93 with mild retropulsion of bone into the canal without significant stenosis, chronic fracture of T11 with subacute fractures of L3, L4 and L5.  There was also diffuse bone marrow abnormality.  Latest imaging studies were reviewed by Dr. Earleen Newport.  Patient appears to be suitable candidate for T12 vertebral body augmentation.Risks and benefits of procedure were discussed with the patient/family including, but not limited to education regarding the natural healing process of compression fractures without intervention, bleeding, infection, cement migration which may cause spinal cord damage, paralysis, pulmonary embolism or even death.  This interventional procedure involves the use of X-rays and because of the nature of the planned procedure, it is possible that we will have prolonged use of X-ray fluoroscopy.  Potential radiation risks to you include (but are not limited to) the following: - A slightly elevated risk for cancer  several years later in life. This risk is typically less than 0.5% percent. This risk is low in comparison to the normal  incidence of human cancer, which is 33% for women and 50% for men according to the [place]. - Radiation induced injury can include skin redness, resembling a rash, tissue breakdown / ulcers and hair loss (which can be temporary or permanent).   The likelihood of either of these occurring depends on the difficulty of the procedure and whether you are sensitive to radiation due to previous procedures, disease, or genetic conditions.   IF your procedure requires a prolonged use of radiation, you will be notified and given written instructions for further action.  It is your responsibility to monitor the irradiated area for the 2 weeks following the procedure and to notify your physician if you are concerned that you have suffered a radiation induced injury.    All of the patient's questions were answered, patient is agreeable to proceed.  Awaiting insurance preauthorization before scheduling patient . Do not anticipate hearing from insurance company until next week. Will cont to monitor.         Thank you for this interesting consult.  I greatly enjoyed meeting [name] and look forward to participating in their care.  A copy of this report was sent to the requesting provider on this date.  Electronically Signed: D. Rowe Robert, PA-C 05/29/2018, 3:16 PM   I spent a total of 40 minutes  in face to face in clinical consultation, greater than 50% of which was counseling/coordinating care for possible T12 vertebral body augmentation

## 2018-05-29 NOTE — Progress Notes (Signed)
PROGRESS NOTE    [name]  XFG:[id number] DOB: 03/13/1941 DOA: 05/28/2018 PCP: [name], MD   Brief Narrative:  HPI On 05/28/2018 by Dr. Roxan Hockey Guliana Weyandt  is a 77 y.o. 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 Initially patient thought she may have had a fever couple days ago on further questioning both patient and mother and sister all deny fevers lately No vomiting, no diarrhea, no sick contacts, no productive cough, no hematochezia, no melena Patient had epidural steroid shots last week for back pain,   Assessment & Plan   Acute T12 compression fracture, subacute fractures of L3-4 and 5 -T abdomen pelvis showed acute fracture of T12 -Orthopedic surgery consulted and appreciated -Interventional radiology consulted for possible kyphoplasty and is recommended MRI of the spine -MRI lumbar spine showed mild superior endplate fracture of R67 appears acute.  Mild retropulsion of the bone into the canal without significant stenosis.  Chronic fracture of T11.  Subacute fractures of L3-4 and 5. -Continue pain control- will add on oxycodone -Will need PT/OT eventually  Chronic cold agglutinin disease/autoimmune hemolytic anemia -Oncology, Dr. Claiborne Billings consulted and appreciated -Hemoglobin on admission 4.8 -Patient transfused 2 units PRBC -Hemoglobin currently 8.1  Hypokalemia -Will replace and continue to monitor BMP -magnesium checked, 1.8  DVT Prophylaxis  SCDs  Code Status: Full  Family Communication: Mother at bedside  Disposition Plan: Observation.   Consultants Orthopedic surgery Interventional radiology Oncology  Procedures  None  Antibiotics   Anti-infectives (From admission, onward)   Start     Dose/Rate Route Frequency Ordered Stop   05/28/18 1800  ceFEPIme (MAXIPIME) 1 g in sodium chloride 0.9 % 100 mL IVPB  Status:  Discontinued     1 g 200 mL/hr over 30 Minutes Intravenous Every 12 hours 05/28/18 1728 05/28/18 1810      Subjective:   [name] seen and examined today.  States she has back pain with movement.  Denies pain while still in bed.  Denies current chest pain, shortness of breath, abdominal pain, nausea or vomiting, diarrhea or constipation, dizziness or headache.  Objective:   Vitals:   05/28/18 2141 05/28/18 2200 05/29/18 0109 05/29/18 0644  BP: (!) 114/46  (!) 127/56 (!) 120/52  Pulse: 81 76 83   Resp: 20 16  16   Temp: 99.5 F (37.5 C) 98.8 F (37.1 C) 99.3 F (37.4 C) 98.4 F (36.9 C)  TempSrc: Oral Oral Oral Oral  SpO2: 95% 97% 95% 93%  Weight:      Height:        Intake/Output Summary (Last 24 hours) at 05/29/2018 1308 Last data filed at 05/29/2018 0900 Gross per 24 hour  Intake 1908 ml  Output 250 ml  Net 1658 ml   Filed Weights   05/28/18 1724  Weight: 42.2 kg    Exam  General: Well developed, well nourished, NAD, appears stated age  HEENT: NCAT, mucous membranes moist.   Neck: Supple  Cardiovascular: S1 S2 auscultated, no rubs, murmurs or gallops. Regular rate and rhythm.  Respiratory: Clear to auscultation bilaterally with equal chest rise  Abdomen: Soft, nontender, nondistended, + bowel sounds  Extremities: warm dry without cyanosis clubbing or edema  Neuro: AAOx3, nonfocal  Psych: Normal affect and demeanor with intact judgement and insight   Data Reviewed: I  have personally reviewed following labs and imaging studies  CBC: Recent Labs  Lab 05/28/18 1136 05/29/18 0411  WBC 10.4 11.4*  NEUTROABS 7.4  --   HGB 4.8* 8.1*  HCT 14.6* 24.1*  MCV 109.0* 95.6  PLT 612* 702   Basic Metabolic Panel: Recent Labs  Lab 05/28/18 1136 05/29/18 0411  NA 139 140  K 4.0 3.2*  CL 96* 97*  CO2 31 33*    GLUCOSE 117* 119*  BUN 25* 23  CREATININE 0.76 0.64  CALCIUM 8.6* 8.1*  MG  --  1.8   GFR: Estimated Creatinine Clearance: 39.9 mL/min (by C-G formula based on SCr of 0.64 mg/dL). Liver Function Tests: Recent Labs  Lab 05/28/18 1136  AST 21  ALT 10  ALKPHOS 101  BILITOT 4.4*  PROT 6.6  ALBUMIN 3.7   No results for input(s): LIPASE, AMYLASE in the last 168 hours. No results for input(s): AMMONIA in the last 168 hours. Coagulation Profile: No results for input(s): INR, PROTIME in the last 168 hours. Cardiac Enzymes: No results for input(s): CKTOTAL, CKMB, CKMBINDEX, TROPONINI in the last 168 hours. BNP (last 3 results) No results for input(s): PROBNP in the last 8760 hours. HbA1C: No results for input(s): HGBA1C in the last 72 hours. CBG: No results for input(s): GLUCAP in the last 168 hours. Lipid Profile: No results for input(s): CHOL, HDL, LDLCALC, TRIG, CHOLHDL, LDLDIRECT in the last 72 hours. Thyroid Function Tests: No results for input(s): TSH, T4TOTAL, FREET4, T3FREE, THYROIDAB in the last 72 hours. Anemia Panel: Recent Labs    05/28/18 1743  RETICCTPCT 14.3*   Urine analysis:    Component Value Date/Time   COLORURINE YELLOW 05/28/2018 2055   APPEARANCEUR CLEAR 05/28/2018 2055   LABSPEC >1.046 (H) 05/28/2018 2055   PHURINE 6.0 05/28/2018 2055   GLUCOSEU NEGATIVE 05/28/2018 2055   HGBUR SMALL (A) 05/28/2018 2055   BILIRUBINUR NEGATIVE 05/28/2018 2055   [place] NEGATIVE 05/28/2018 2055   PROTEINUR NEGATIVE 05/28/2018 2055   NITRITE NEGATIVE 05/28/2018 2055   LEUKOCYTESUR NEGATIVE 05/28/2018 2055   Sepsis Labs: @LABRCNTIP (procalcitonin:4,lacticidven:4)  )No results found for this or any previous visit (from the past 240 hour(s)).    Radiology Studies: Dg Chest 2 View  Result Date: 05/28/2018 CLINICAL DATA:  Hypoxia. EXAM: CHEST - 2 VIEW COMPARISON:  06/12/2016 and prior radiographs FINDINGS: UPPER limits normal heart size again noted. Mild  peribronchial thickening is unchanged. LEFT basilar noted. No definite pleural effusion or pneumothorax. No acute bony abnormalities are present. IMPRESSION: LEFT basilar opacity which may represent airspace disease or atelectasis. Radiographic follow-up to resolution recommended. Electronically Signed   By: Margarette Canada M.D.   On: 05/28/2018 12:49   Ct Angio Chest Pe W And/or Wo Contrast  Result Date: 05/28/2018 CLINICAL DATA:  "Pt complains of lower back pain and left hip pain. Pt states pain has been worse since receiving and epidural a week ago " EXAM: CT ANGIOGRAPHY CHEST CT ABDOMEN AND PELVIS WITH CONTRAST TECHNIQUE: Multidetector CT imaging of the chest was performed using the standard protocol during bolus administration of intravenous contrast. Multiplanar CT image reconstructions and MIPs were obtained to evaluate the vascular anatomy. Multidetector CT imaging of the abdomen and pelvis was performed using the standard protocol during bolus administration of intravenous contrast. CONTRAST:  155mL ISOVUE-370 IOPAMIDOL (ISOVUE-370) INJECTION 76% COMPARISON:  02/04/2018 FINDINGS: CTA CHEST FINDINGS Cardiovascular: The heart size is UPPER normal. No significant coronary artery calcifications. No pericardial effusion. There is atherosclerotic calcification of the thoracic aorta  not associated with aneurysm. The appearance of the pulmonary arteries is unremarkable. The arteries are well opacified and there is no acute pulmonary embolus. Mediastinum/Nodes: The visualized portion of the thyroid gland has a normal appearance. The esophagus is normal in appearance. No mediastinal, hilar, or axillary adenopathy. Lungs/Pleura: There are small bilateral pleural effusions. There is bibasilar atelectasis. Scattered emphysematous changes are identified primarily in the UPPER lobes. Airways are patent. No focal consolidations. Within the RIGHT UPPER lobe there is a spiculated 6 millimeter mass best seen on image 47 of  series 4 a 4 millimeter nodule is identified in the RIGHT UPPER lobe on image 64 of series 4. Musculoskeletal: Remote sternal fracture. Degenerative changes are identified in the thoracic spine. Acute compression fracture of T12. Review of the MIP images confirms the above findings. CT ABDOMEN and PELVIS FINDINGS Hepatobiliary: The liver is homogeneous. There is mild periportal edema. No intra or extrahepatic biliary duct dilatation. Gallbladder contains several calcified stones. No pericholecystic fluid. Pancreas: Atrophic and otherwise normal appearance of the pancreas. Spleen: Normal in size without focal abnormality. Adrenals/Urinary Tract: Adrenal glands are normal in appearance. There is symmetric enhancement and excretion of the kidneys. The bladder and visualized portion of the urethra are normal. Stomach/Bowel: The stomach and bowel loops are normal in appearance. There are numerous colonic diverticula. There is thickening of the sigmoid segment which may be related to chronic diverticulosis. No associated inflammatory changes to indicate presence of acute diverticulitis. Vascular/Lymphatic: There is atherosclerotic calcification of the abdominal aorta not associated with aneurysm. Although involved by atherosclerosis, there is vascular opacification of the celiac axis, superior mesenteric artery, and inferior mesenteric artery. Normal appearance of the portal venous system and inferior vena cava. No retroperitoneal or mesenteric adenopathy. CT Reproductive: The uterus is surgically absent.  No adnexal mass. Other: No free pelvic fluid. Anterior abdominal wall is unremarkable. Musculoskeletal: Acute superior endplate fracture of Q65. Chronic anterior wedge deformities of T11. Subacute compression fractures of L3, L4, and L5. Remote ORIF of the LEFT hip. No joint effusion or evidence for dislocation. Review of the MIP images confirms the above findings. IMPRESSION: 1. Acute fracture of T12. 2. No acute  pulmonary embolus. 3. Spiculated pulmonary nodule in the RIGHT UPPER lobe measuring 6 millimeters. Morphology is suspicious and further evaluation is recommended. Recommend noncontrast CT of the chest in 3 months. 4.  Emphysema (ICD10-J43.9). 5. Subacute or chronic fractures of T11, L3, L4, and L5. Remote sternal fracture. 6. Mild periportal edema of uncertain etiology. 7. Colonic diverticulosis. Thickening of the sigmoid segment of the colon may be related to chronic diverticulosis. There is no evidence for acute inflammatory change. 8. Hysterectomy. These results were called by telephone at the time of interpretation on 05/28/2018 at 3:15 pm to Dr. Rodell Perna , who verbally acknowledged these results. Electronically Signed   By: Nolon Nations M.D.   On: 05/28/2018 15:17   Mr Lumbar Spine Wo Contrast  Result Date: 05/29/2018 CLINICAL DATA:  Multiple fractures.  Assess for kyphoplasty. EXAM: MRI LUMBAR SPINE WITHOUT CONTRAST TECHNIQUE: Multiplanar, multisequence MR imaging of the lumbar spine was performed. No intravenous contrast was administered. COMPARISON:  CT lumbar 05/28/2018, MRI lumbar 01/29/2018 FINDINGS: Segmentation:  Normal Alignment:  Normal Vertebrae: Mild superior endplate fracture H84 with bone marrow edema. This appears acute based on MRI and CT. Mild retropulsion into the canal without significant stenosis. Chronic fracture T11 Mild fracture of L3 vertebral body appears subacute Moderate subacute fracture of L4 Moderate subacute fracture of L5  Bone marrow is diffusely abnormal with low signal on T1 and T2. No focal lesion. Conus medullaris and cauda equina: Conus extends to the L1-2 level. Conus and cauda equina appear normal. Paraspinal and other soft tissues: Negative for paraspinous mass. Mild paraspinous soft tissue thickening/hematoma related to the T12 fracture. Disc levels: T11-12: Mild retropulsion of T12 into the canal due to fracture without significant stenosis T12-L1: Negative L1-2:  Negative L2-3: Mild disc and mild facet degeneration without significant stenosis. L3-4: Disc degeneration with diffuse disc bulging and spurring. Bilateral facet hypertrophy. Mild spinal stenosis and mild subarticular stenosis bilaterally L4-5: Disc degeneration and endplate spurring. Bilateral facet hypertrophy. Mild spinal stenosis L5-S1: Mild disc and mild facet degeneration without stenosis. IMPRESSION: Mild superior endplate fracture Y86 appears acute. Mild retropulsion of bone into the canal without significant stenosis Chronic fracture T11 Subacute fractures of L3, L4, and L5 unchanged from recent  CT. Diffuse bone marrow abnormality. Question anemia or myeloproliferative disorder. Correlate with CBC. Electronically Signed   By: Franchot Gallo M.D.   On: 05/29/2018 11:31   Ct Abdomen Pelvis W Contrast  Result Date: 05/28/2018 CLINICAL DATA:  "Pt complains of lower back pain and left hip pain. Pt states pain has been worse since receiving and epidural a week ago " EXAM: CT ANGIOGRAPHY CHEST CT ABDOMEN AND PELVIS WITH CONTRAST TECHNIQUE: Multidetector CT imaging of the chest was performed using the standard protocol during bolus administration of intravenous contrast. Multiplanar CT image reconstructions and MIPs were obtained to evaluate the vascular anatomy. Multidetector CT imaging of the abdomen and pelvis was performed using the standard protocol during bolus administration of intravenous contrast. CONTRAST:  120mL ISOVUE-370 IOPAMIDOL (ISOVUE-370) INJECTION 76% COMPARISON:  02/04/2018 FINDINGS: CTA CHEST FINDINGS Cardiovascular: The heart size is UPPER normal. No significant coronary artery calcifications. No pericardial effusion. There is atherosclerotic calcification of the thoracic aorta not associated with aneurysm. The appearance of the pulmonary arteries is unremarkable. The arteries are well opacified and there is no acute pulmonary embolus. Mediastinum/Nodes: The visualized portion of the  thyroid gland has a normal appearance. The esophagus is normal in appearance. No mediastinal, hilar, or axillary adenopathy. Lungs/Pleura: There are small bilateral pleural effusions. There is bibasilar atelectasis. Scattered emphysematous changes are identified primarily in the UPPER lobes. Airways are patent. No focal consolidations. Within the RIGHT UPPER lobe there is a spiculated 6 millimeter mass best seen on image 47 of series 4 a 4 millimeter nodule is identified in the RIGHT UPPER lobe on image 64 of series 4. Musculoskeletal: Remote sternal fracture. Degenerative changes are identified in the thoracic spine. Acute compression fracture of T12. Review of the MIP images confirms the above findings. CT ABDOMEN and PELVIS FINDINGS Hepatobiliary: The liver is homogeneous. There is mild periportal edema. No intra or extrahepatic biliary duct dilatation. Gallbladder contains several calcified stones. No pericholecystic fluid. Pancreas: Atrophic and otherwise normal appearance of the pancreas. Spleen: Normal in size without focal abnormality. Adrenals/Urinary Tract: Adrenal glands are normal in appearance. There is symmetric enhancement and excretion of the kidneys. The bladder and visualized portion of the urethra are normal. Stomach/Bowel: The stomach and bowel loops are normal in appearance. There are numerous colonic diverticula. There is thickening of the sigmoid segment which may be related to chronic diverticulosis. No associated inflammatory changes to indicate presence of acute diverticulitis. Vascular/Lymphatic: There is atherosclerotic calcification of the abdominal aorta not associated with aneurysm. Although involved by atherosclerosis, there is vascular opacification of the celiac axis, superior mesenteric artery, and  inferior mesenteric artery. Normal appearance of the portal venous system and inferior vena cava. No retroperitoneal or mesenteric adenopathy. CT Reproductive: The uterus is surgically  absent.  No adnexal mass. Other: No free pelvic fluid. Anterior abdominal wall is unremarkable. Musculoskeletal: Acute superior endplate fracture of F63. Chronic anterior wedge deformities of T11. Subacute compression fractures of L3, L4, and L5. Remote ORIF of the LEFT hip. No joint effusion or evidence for dislocation. Review of the MIP images confirms the above findings. IMPRESSION: 1. Acute fracture of T12. 2. No acute pulmonary embolus. 3. Spiculated pulmonary nodule in the RIGHT UPPER lobe measuring 6 millimeters. Morphology is suspicious and further evaluation is recommended. Recommend noncontrast CT of the chest in 3 months. 4.  Emphysema (ICD10-J43.9). 5. Subacute or chronic fractures of T11, L3, L4, and L5. Remote sternal fracture. 6. Mild periportal edema of uncertain etiology. 7. Colonic diverticulosis. Thickening of the sigmoid segment of the colon may be related to chronic diverticulosis. There is no evidence for acute inflammatory change. 8. Hysterectomy. These results were called by telephone at the time of interpretation on 05/28/2018 at 3:15 pm to Dr. Rodell Perna , who verbally acknowledged these results. Electronically Signed   By: Nolon Nations M.D.   On: 05/28/2018 15:17   Ct L-spine No Charge  Result Date: 05/28/2018 CLINICAL DATA:  Low back pain EXAM: CT LUMBAR SPINE WITHOUT CONTRAST TECHNIQUE: Multidetector CT imaging of the lumbar spine was performed without intravenous contrast administration. Multiplanar CT image reconstructions were also generated. COMPARISON:  Lumbar MRI 01/29/2018 FINDINGS: Segmentation: Normal Alignment: Normal Vertebrae: Chronic compression fracture T11 unchanged -Mild fracture superior endplate of W46 appears acute -Inferior endplate compression fracture of L3 is new since the prior study. Sclerotic bone changes is present suggesting healing from recent fracture -Moderately severe compression fracture of L4 has progressed since the prior study with evidence of  bone healing -Moderate compression fracture L5 is new since the prior MRI and shows evidence of healing with sclerotic changes. Paraspinal and other soft tissues: Bibasilar atelectasis. Atherosclerotic aorta. No paraspinous soft tissue swelling. Disc levels: T12-L1: Negative L1-2: Negative L2-3: Mild disc bulging and mild facet degeneration without significant stenosis L3-4: Disc degeneration and disc bulging with spurring. Bilateral mild facet degeneration. No significant stenosis L4-5: Disc degeneration with disc bulging and endplate spurring. Mild facet degeneration. No significant spinal stenosis L5-S1: Mild disc degeneration without stenosis. IMPRESSION: Acute compression fracture of T12 Subacute compression fractures of L3, L4, and L5. Chronic fracture of T11 Multilevel degenerative changes in the lumbar spine without significant stenosis. Atherosclerotic aorta Electronically Signed   By: Franchot Gallo M.D.   On: 05/28/2018 14:43     Scheduled Meds: . folic acid  1 mg Oral Daily  . multivitamin with minerals  1 tablet Oral Daily  . senna-docusate  2 tablet Oral BID  . sodium chloride flush  3 mL Intravenous Q12H   Continuous Infusions: . sodium chloride       LOS: 0 days   Time Spent in minutes   30 minutes  Finian Helvey D.O. on 05/29/2018 at 1:08 PM  Between 7am to 7pm - Please see pager noted on [email or web address]  After 7pm go to [email or web address]  And look for the night coverage person covering for me after hours  [facility] Office  [phone]

## 2018-05-30 DIAGNOSIS — E876 Hypokalemia: Secondary | ICD-10-CM

## 2018-05-30 DIAGNOSIS — D649 Anemia, unspecified: Secondary | ICD-10-CM | POA: Diagnosis not present

## 2018-05-30 DIAGNOSIS — D591 Other autoimmune hemolytic anemias: Secondary | ICD-10-CM | POA: Diagnosis not present

## 2018-05-30 LAB — BASIC METABOLIC PANEL
Anion gap: 10 (ref 5–15)
BUN: 16 mg/dL (ref 8–23)
CO2: 30 mmol/L (ref 22–32)
Calcium: 8.2 mg/dL — ABNORMAL LOW (ref 8.9–10.3)
Chloride: 99 mmol/L (ref 98–111)
Creatinine, Ser: 0.48 mg/dL (ref 0.44–1.00)
GFR calc non Af Amer: >60 mL/min (ref >60)
Glucose, Bld: 92 mg/dL (ref 70–99)
POTASSIUM: 3.6 mmol/L (ref 3.5–5.1)
Sodium: 139 mmol/L (ref 135–145)

## 2018-05-30 LAB — CBC
HEMATOCRIT: 27.5 % — AB (ref 36.0–46.0)
HEMOGLOBIN: 9.2 g/dL — AB (ref 12.0–15.0)
MCH: 34.3 pg — ABNORMAL HIGH (ref 26.0–34.0)
MCHC: 33.5 g/dL (ref 30.0–36.0)
MCV: 102.6 fL — AB (ref 78.0–100.0)
Platelets: 430 10*3/uL — ABNORMAL HIGH (ref 150–400)
RBC: 2.68 MIL/uL — AB (ref 3.87–5.11)
RDW: 24.6 % — ABNORMAL HIGH (ref 11.5–15.5)
WBC: 11.2 10*3/uL — ABNORMAL HIGH (ref 4.0–10.5)

## 2018-05-30 LAB — URINE CULTURE: CULTURE: NO GROWTH

## 2018-05-30 NOTE — Progress Notes (Signed)
Subjective: Complains of significant mid back pain in the T12 region with motion of her back.  It hurts anytime she moves.  Had MRI of the lumbar spine yesterday.  Objective: Vital signs in last 24 hours: Temp:  [98.4 F (36.9 C)-98.7 F (37.1 C)] 98.6 F (37 C) (09/07 0436) Pulse Rate:  [80-89] 87 (09/07 0436) Resp:  [18-20] 18 (09/07 0436) BP: (118-138)/(50-61) 134/50 (09/07 0436) SpO2:  [94 %-97 %] 95 % (09/07 0436)  Intake/Output from previous day: 09/06 0701 - 09/07 0700 In: 1320 [P.O.:1320] Out: 700 [Urine:700] Intake/Output this shift: No intake/output data recorded.  Recent Labs    05/28/18 1136 05/29/18 0411 05/30/18 0916  HGB 4.8* 8.1* 9.2*   Recent Labs    05/29/18 0411 05/30/18 0916  WBC 11.4* 11.2*  RBC 2.52* 2.68*  HCT 24.1* 27.5*  PLT 390 430*   Recent Labs    05/29/18 0411 05/30/18 0458  NA 140 139  K 3.2* 3.6  CL 97* 99  CO2 33* 30  BUN 23 16  CREATININE 0.64 0.48  GLUCOSE 119* 92  CALCIUM 8.1* 8.2*   CLINICAL DATA:  Multiple fractures.  Assess for kyphoplasty.  EXAM: MRI LUMBAR SPINE WITHOUT CONTRAST  TECHNIQUE: Multiplanar, multisequence MR imaging of the lumbar spine was performed. No intravenous contrast was administered.  COMPARISON:  CT lumbar 05/28/2018, MRI lumbar 01/29/2018  FINDINGS: Segmentation:  Normal  Alignment:  Normal  Vertebrae: Mild superior endplate fracture S34 with bone marrow edema. This appears acute based on MRI and CT. Mild retropulsion into the canal without significant stenosis.  Chronic fracture T11  Mild fracture of L3 vertebral body appears subacute  Moderate subacute fracture of L4  Moderate subacute fracture of L5  Bone marrow is diffusely abnormal with low signal on T1 and T2. No focal lesion.  Conus medullaris and cauda equina: Conus extends to the L1-2 level. Conus and cauda equina appear normal.  Paraspinal and other soft tissues: Negative for paraspinous mass. Mild  paraspinous soft tissue thickening/hematoma related to the T12 fracture.  Disc levels:  T11-12: Mild retropulsion of T12 into the canal due to fracture without significant stenosis  T12-L1: Negative  L1-2: Negative  L2-3: Mild disc and mild facet degeneration without significant stenosis.  L3-4: Disc degeneration with diffuse disc bulging and spurring. Bilateral facet hypertrophy. Mild spinal stenosis and mild subarticular stenosis bilaterally  L4-5: Disc degeneration and endplate spurring. Bilateral facet hypertrophy. Mild spinal stenosis  L5-S1: Mild disc and mild facet degeneration without stenosis.  IMPRESSION: Mild superior endplate fracture H96 appears acute. Mild retropulsion of bone into the canal without significant stenosis  Chronic fracture T11  Subacute fractures of L3, L4, and L5 unchanged from recent  CT.  Diffuse bone marrow abnormality. Question anemia or myeloproliferative disorder. Correlate with CBC.   Electronically Signed   By: Franchot Gallo M.D.   On: 05/29/2018 11:31 Physical exam: She has pain with percussion over the T12 vertebral body.  No deformity in the area.  Normal N/V status in both lower extremities.      Assessment/Plan: Acute T12 compression fracture documented on MRI scan.  Pain with activity. Plan: I discussed with the patient that I feel the best treatment at this point would be T12 vertebral augmentation by the interventional radiologist.  They have already evaluated the patient and have made this recommendation. Patient is in agreement with this plan and is ready to move forward with it.    Erlene Senters 05/30/2018, 10:21 AM

## 2018-05-30 NOTE — Progress Notes (Signed)
PROGRESS NOTE    [name]  ZMO:[id number] DOB: 02/03/1941 DOA: 05/28/2018 PCP: [name], MD   Brief Narrative:  HPI On 05/28/2018 by Dr. Roxan Hockey Asheley Cunningham  is a 77 y.o. 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 Initially patient thought she may have had a fever couple days ago on further questioning both patient and mother and sister all deny fevers lately No vomiting, no diarrhea, no sick contacts, no productive cough, no hematochezia, no melena Patient had epidural steroid shots last week for back pain,   Interim history Admitted for back pain found to have an acute T12 compression fracture.  IR as well as orthopedic surgery consulted.  Planning for vertebral augmentation. Assessment & Plan   Acute T12 compression fracture, subacute fractures of L3-4 and 5 -T abdomen pelvis showed acute fracture of T12 -Orthopedic surgery consulted and appreciated -Interventional radiology consulted for possible kyphoplasty and recommended MRI of the spine. Recommending vertebral augmentation for relief of pain. -MRI lumbar spine showed mild superior endplate fracture of K35 appears acute.  Mild retropulsion of the bone into the canal without significant stenosis.  Chronic fracture of T11.  Subacute fractures of L3-4 and 5. -Continue pain control (worsens with movement) -Will need PT/OT eventually  Chronic cold agglutinin disease/autoimmune hemolytic anemia -Oncology, Dr. Claiborne Billings consulted and appreciated -Hemoglobin on admission 4.8 -Patient transfused 2 units PRBC on admission -Hemoglobin currently 9.2  Hypokalemia -Resolved with replacement, continue to monitor BMP -magnesium checked, 1.8  DVT Prophylaxis   SCDs  Code Status: Full  Family Communication: None at bedside  Disposition Plan: Observation.  Did not feel the patient can be safely discharged given that her pain worsens with movement.  Will likely need to remain in the hospital until IR procedure.  Consultants Orthopedic surgery Interventional radiology Oncology  Procedures  None  Antibiotics   Anti-infectives (From admission, onward)   Start     Dose/Rate Route Frequency Ordered Stop   05/28/18 1800  ceFEPIme (MAXIPIME) 1 g in sodium chloride 0.9 % 100 mL IVPB  Status:  Discontinued     1 g 200 mL/hr over 30 Minutes Intravenous Every 12 hours 05/28/18 1728 05/28/18 1810      Subjective:   [name] seen and examined today.  Of back pain particularly with movement.  Feels the pain is relieved when she is lying still in bed.  Denies current chest pain, shortness of breath, abdominal pain, nausea or vomiting, diarrhea or constipation, dizziness or headache.    Objective:   Vitals:   05/29/18 0644 05/29/18 1430 05/29/18 2112 05/30/18 0436  BP: (!) 120/52 118/61 (!) 138/55 (!) 134/50  Pulse:  89 80 87  Resp: 16 18 20 18   Temp: 98.4 F (36.9 C) 98.7 F (37.1 C) 98.4 F (36.9 C) 98.6 F (37 C)  TempSrc: Oral Oral Oral   SpO2: 93% 94% 97% 95%  Weight:      Height:        Intake/Output Summary (Last 24 hours) at 05/30/2018 1259 Last data filed at 05/30/2018 0437 Gross per 24 hour  Intake 600 ml  Output 400 ml  Net 200 ml   Filed Weights   05/28/18 1724  Weight: 42.2 kg   Exam  General: Well developed, well nourished, NAD, appears stated age  72:  NCAT, mucous membranes moist.   Neck: Supple  Cardiovascular: S1 S2 auscultated, RRR, no murmur  Respiratory: Clear to auscultation bilaterally with equal chest rise  Abdomen: Soft, nontender, nondistended, + bowel sounds  Extremities: warm dry without cyanosis clubbing or edema. Pain with movement of LE.  Neuro: AAOx3, nonfocal  Psych: Normal  affect and demeanor  Data Reviewed: I have personally reviewed following labs and imaging studies  CBC: Recent Labs  Lab 05/28/18 1136 05/29/18 0411 05/30/18 0916  WBC 10.4 11.4* 11.2*  NEUTROABS 7.4  --   --   HGB 4.8* 8.1* 9.2*  HCT 14.6* 24.1* 27.5*  MCV 109.0* 95.6 102.6*  PLT 612* 390 945*   Basic Metabolic Panel: Recent Labs  Lab 05/28/18 1136 05/29/18 0411 05/30/18 0458  NA 139 140 139  K 4.0 3.2* 3.6  CL 96* 97* 99  CO2 31 33* 30  GLUCOSE 117* 119* 92  BUN 25* 23 16  CREATININE 0.76 0.64 0.48  CALCIUM 8.6* 8.1* 8.2*  MG  --  1.8  --    GFR: Estimated Creatinine Clearance: 39.9 mL/min (by C-G formula based on SCr of 0.48 mg/dL). Liver Function Tests: Recent Labs  Lab 05/28/18 1136  AST 21  ALT 10  ALKPHOS 101  BILITOT 4.4*  PROT 6.6  ALBUMIN 3.7   No results for input(s): LIPASE, AMYLASE in the last 168 hours. No results for input(s): AMMONIA in the last 168 hours. Coagulation Profile: No results for input(s): INR, PROTIME in the last 168 hours. Cardiac Enzymes: No results for input(s): CKTOTAL, CKMB, CKMBINDEX, TROPONINI in the last 168 hours. BNP (last 3 results) No results for input(s): PROBNP in the last 8760 hours. HbA1C: No results for input(s): HGBA1C in the last 72 hours. CBG: No results for input(s): GLUCAP in the last 168 hours. Lipid Profile: No results for input(s): CHOL, HDL, LDLCALC, TRIG, CHOLHDL, LDLDIRECT in the last 72 hours. Thyroid Function Tests: No results for input(s): TSH, T4TOTAL, FREET4, T3FREE, THYROIDAB in the last 72 hours. Anemia Panel: Recent Labs    05/28/18 1743  RETICCTPCT 14.3*   Urine analysis:    Component Value Date/Time   COLORURINE YELLOW 05/28/2018 2055   APPEARANCEUR CLEAR 05/28/2018 2055   LABSPEC >1.046 (H) 05/28/2018 2055   PHURINE 6.0 05/28/2018 2055   GLUCOSEU NEGATIVE 05/28/2018 2055   HGBUR SMALL (A) 05/28/2018 2055   BILIRUBINUR NEGATIVE 05/28/2018 2055   KETONESUR NEGATIVE  05/28/2018 2055   PROTEINUR NEGATIVE 05/28/2018 2055   NITRITE NEGATIVE 05/28/2018 2055   LEUKOCYTESUR NEGATIVE 05/28/2018 2055   Sepsis Labs: @LABRCNTIP (procalcitonin:4,lacticidven:4)  ) Recent Results (from the past 240 hour(s))  Culture, blood (Routine X 2) w Reflex to ID Panel     Status: None (Preliminary result)   Collection Time: 05/28/18  5:43 PM  Result Value Ref Range Status   Specimen Description   Final    BLOOD RIGHT ANTECUBITAL Performed at [facility], [place] [address] [place], [place] [address]    Special Requests   Final    BOTTLES DRAWN AEROBIC ONLY Blood Culture results may not be optimal due to an inadequate volume of blood received in culture bottles Performed at [facility] [address], [place], [place] [address]    Culture   Final    NO GROWTH 2 DAYS Performed at [facility] [address], [place], [place] [address]    Report Status PENDING  Incomplete  Culture, blood (Routine X 2) w Reflex to  ID Panel     Status: None (Preliminary result)   Collection Time: 05/28/18  5:44 PM  Result Value Ref Range Status   Specimen Description   Final    BLOOD LEFT ANTECUBITAL Performed at [facility] [address], [place], [place] [address]    Special Requests   Final    BOTTLES DRAWN AEROBIC AND ANAEROBIC Blood Culture adequate volume Performed at [facility] [address] [place], [place] [address]    Culture   Final    NO GROWTH 2 DAYS Performed at [facility] [address], [place], [place] [address]    Report Status PENDING  Incomplete  Urine Culture     Status: None   Collection Time: 05/28/18  8:55 PM  Result Value Ref Range Status   Specimen Description   Final    URINE, CLEAN CATCH Performed at [facility], [place] [address] [place], [place] [address]    Special Requests   Final    Immunocompromised Performed at [facility], [place] [address], [place], [place] [address]    Culture   Final    NO GROWTH Performed at [facility], [place] [address], [place],  [address]    Report Status 05/30/2018 FINAL  Final      Radiology Studies: Ct Angio Chest Pe W And/or Wo Contrast  Result Date: 05/28/2018 CLINICAL DATA:  "Pt complains of lower back pain and left hip pain. Pt states pain has been worse since receiving and epidural a week ago " EXAM: CT ANGIOGRAPHY CHEST CT ABDOMEN AND PELVIS WITH CONTRAST TECHNIQUE: Multidetector CT imaging of the chest was performed using the standard protocol during bolus administration of intravenous contrast. Multiplanar CT image reconstructions and MIPs were obtained to evaluate the vascular anatomy. Multidetector CT imaging of the abdomen and pelvis was performed using the standard protocol during bolus administration of intravenous contrast. CONTRAST:  190mL ISOVUE-370 IOPAMIDOL (ISOVUE-370) INJECTION 76% COMPARISON:  02/04/2018 FINDINGS: CTA CHEST FINDINGS Cardiovascular: The heart size is UPPER normal. No significant coronary artery calcifications. No pericardial effusion. There is atherosclerotic calcification of the thoracic aorta not associated with aneurysm. The appearance of the pulmonary arteries is unremarkable. The arteries are well opacified and there is no acute pulmonary embolus. Mediastinum/Nodes: The visualized portion of the thyroid gland has a normal appearance. The esophagus is normal in appearance. No mediastinal, hilar, or axillary adenopathy. Lungs/Pleura: There are small bilateral pleural effusions. There is bibasilar atelectasis. Scattered emphysematous changes are identified primarily in the UPPER lobes. Airways are patent. No focal consolidations. Within the RIGHT UPPER lobe there is a spiculated 6 millimeter mass best seen on image 47 of series 4 a 4 millimeter nodule is identified in the RIGHT UPPER lobe on image 64 of series 4.  Musculoskeletal: Remote sternal fracture. Degenerative changes are identified in the thoracic spine. Acute compression fracture of T12. Review of the MIP images confirms the above findings. CT ABDOMEN and PELVIS FINDINGS Hepatobiliary: The liver is homogeneous. There is mild periportal edema. No intra or extrahepatic biliary duct dilatation. Gallbladder contains several calcified stones. No pericholecystic fluid. Pancreas: Atrophic and otherwise normal appearance of the pancreas. Spleen: Normal in size without focal abnormality. Adrenals/Urinary Tract: Adrenal glands are normal in appearance. There is symmetric enhancement and excretion of the kidneys. The bladder and visualized portion of the urethra are normal. Stomach/Bowel: The stomach and bowel loops are normal in appearance. There are numerous colonic diverticula.  There is thickening of the sigmoid segment which may be related to chronic diverticulosis. No associated inflammatory changes to indicate presence of acute diverticulitis. Vascular/Lymphatic: There is atherosclerotic calcification of the abdominal aorta not associated with aneurysm. Although involved by atherosclerosis, there is vascular opacification of the celiac axis, superior mesenteric artery, and inferior mesenteric artery. Normal appearance of the portal venous system and inferior vena cava. No retroperitoneal or mesenteric adenopathy. CT Reproductive: The uterus is surgically absent.  No adnexal mass. Other: No free pelvic fluid. Anterior abdominal wall is unremarkable. Musculoskeletal: Acute superior endplate fracture of U44. Chronic anterior wedge deformities of T11. Subacute compression fractures of L3, L4, and L5. Remote ORIF of the LEFT hip. No joint effusion or evidence for dislocation. Review of the MIP images confirms the above findings. IMPRESSION: 1. Acute fracture of T12. 2. No acute pulmonary embolus. 3. Spiculated pulmonary nodule in the RIGHT UPPER lobe measuring 6 millimeters.  Morphology is suspicious and further evaluation is recommended. Recommend noncontrast CT of the chest in 3 months. 4.  Emphysema (ICD10-J43.9). 5. Subacute or chronic fractures of T11, L3, L4, and L5. Remote sternal fracture. 6. Mild periportal edema of uncertain etiology. 7. Colonic diverticulosis. Thickening of the sigmoid segment of the colon may be related to chronic diverticulosis. There is no evidence for acute inflammatory change. 8. Hysterectomy. These results were called by telephone at the time of interpretation on 05/28/2018 at 3:15 pm to Dr. Rodell Perna , who verbally acknowledged these results. Electronically Signed   By: Nolon Nations M.D.   On: 05/28/2018 15:17   Mr Lumbar Spine Wo Contrast  Result Date: 05/29/2018 CLINICAL DATA:  Multiple fractures.  Assess for kyphoplasty. EXAM: MRI LUMBAR SPINE WITHOUT CONTRAST TECHNIQUE: Multiplanar, multisequence MR imaging of the lumbar spine was performed. No intravenous contrast was administered. COMPARISON:  CT lumbar 05/28/2018, MRI lumbar 01/29/2018 FINDINGS: Segmentation:  Normal Alignment:  Normal Vertebrae: Mild superior endplate fracture I34 with bone marrow edema. This appears acute based on MRI and CT. Mild retropulsion into the canal without significant stenosis. Chronic fracture T11 Mild fracture of L3 vertebral body appears subacute Moderate subacute fracture of L4 Moderate subacute fracture of L5 Bone marrow is diffusely abnormal with low signal on T1 and T2. No focal lesion. Conus medullaris and cauda equina: Conus extends to the L1-2 level. Conus and cauda equina appear normal. Paraspinal and other soft tissues: Negative for paraspinous mass. Mild paraspinous soft tissue thickening/hematoma related to the T12 fracture. Disc levels: T11-12: Mild retropulsion of T12 into the canal due to fracture without significant stenosis T12-L1: Negative L1-2: Negative L2-3: Mild disc and mild facet degeneration without significant stenosis. L3-4: Disc  degeneration with diffuse disc bulging and spurring. Bilateral facet hypertrophy. Mild spinal stenosis and mild subarticular stenosis bilaterally L4-5: Disc degeneration and endplate spurring. Bilateral facet hypertrophy. Mild spinal stenosis L5-S1: Mild disc and mild facet degeneration without stenosis. IMPRESSION: Mild superior endplate fracture V42 appears acute. Mild retropulsion of bone into the canal without significant stenosis Chronic fracture T11 Subacute fractures of L3, L4, and L5 unchanged from recent  CT. Diffuse bone marrow abnormality. Question anemia or myeloproliferative disorder. Correlate with CBC. Electronically Signed   By: Franchot Gallo M.D.   On: 05/29/2018 11:31   Ct Abdomen Pelvis W Contrast  Result Date: 05/28/2018 CLINICAL DATA:  "Pt complains of lower back pain and left hip pain. Pt states pain has been worse since receiving and epidural a week ago " EXAM: CT ANGIOGRAPHY CHEST CT ABDOMEN AND  PELVIS WITH CONTRAST TECHNIQUE: Multidetector CT imaging of the chest was performed using the standard protocol during bolus administration of intravenous contrast. Multiplanar CT image reconstructions and MIPs were obtained to evaluate the vascular anatomy. Multidetector CT imaging of the abdomen and pelvis was performed using the standard protocol during bolus administration of intravenous contrast. CONTRAST:  125mL ISOVUE-370 IOPAMIDOL (ISOVUE-370) INJECTION 76% COMPARISON:  02/04/2018 FINDINGS: CTA CHEST FINDINGS Cardiovascular: The heart size is UPPER normal. No significant coronary artery calcifications. No pericardial effusion. There is atherosclerotic calcification of the thoracic aorta not associated with aneurysm. The appearance of the pulmonary arteries is unremarkable. The arteries are well opacified and there is no acute pulmonary embolus. Mediastinum/Nodes: The visualized portion of the thyroid gland has a normal appearance. The esophagus is normal in appearance. No mediastinal,  hilar, or axillary adenopathy. Lungs/Pleura: There are small bilateral pleural effusions. There is bibasilar atelectasis. Scattered emphysematous changes are identified primarily in the UPPER lobes. Airways are patent. No focal consolidations. Within the RIGHT UPPER lobe there is a spiculated 6 millimeter mass best seen on image 47 of series 4 a 4 millimeter nodule is identified in the RIGHT UPPER lobe on image 64 of series 4. Musculoskeletal: Remote sternal fracture. Degenerative changes are identified in the thoracic spine. Acute compression fracture of T12. Review of the MIP images confirms the above findings. CT ABDOMEN and PELVIS FINDINGS Hepatobiliary: The liver is homogeneous. There is mild periportal edema. No intra or extrahepatic biliary duct dilatation. Gallbladder contains several calcified stones. No pericholecystic fluid. Pancreas: Atrophic and otherwise normal appearance of the pancreas. Spleen: Normal in size without focal abnormality. Adrenals/Urinary Tract: Adrenal glands are normal in appearance. There is symmetric enhancement and excretion of the kidneys. The bladder and visualized portion of the urethra are normal. Stomach/Bowel: The stomach and bowel loops are normal in appearance. There are numerous colonic diverticula. There is thickening of the sigmoid segment which may be related to chronic diverticulosis. No associated inflammatory changes to indicate presence of acute diverticulitis. Vascular/Lymphatic: There is atherosclerotic calcification of the abdominal aorta not associated with aneurysm. Although involved by atherosclerosis, there is vascular opacification of the celiac axis, superior mesenteric artery, and inferior mesenteric artery. Normal appearance of the portal venous system and inferior vena cava. No retroperitoneal or mesenteric adenopathy. CT Reproductive: The uterus is surgically absent.  No adnexal mass. Other: No free pelvic fluid. Anterior abdominal wall is unremarkable.  Musculoskeletal: Acute superior endplate fracture of L24. Chronic anterior wedge deformities of T11. Subacute compression fractures of L3, L4, and L5. Remote ORIF of the LEFT hip. No joint effusion or evidence for dislocation. Review of the MIP images confirms the above findings. IMPRESSION: 1. Acute fracture of T12. 2. No acute pulmonary embolus. 3. Spiculated pulmonary nodule in the RIGHT UPPER lobe measuring 6 millimeters. Morphology is suspicious and further evaluation is recommended. Recommend noncontrast CT of the chest in 3 months. 4.  Emphysema (ICD10-J43.9). 5. Subacute or chronic fractures of T11, L3, L4, and L5. Remote sternal fracture. 6. Mild periportal edema of uncertain etiology. 7. Colonic diverticulosis. Thickening of the sigmoid segment of the colon may be related to chronic diverticulosis. There is no evidence for acute inflammatory change. 8. Hysterectomy. These results were called by telephone at the time of interpretation on 05/28/2018 at 3:15 pm to Dr. Rodell Perna , who verbally acknowledged these results. Electronically Signed   By: Nolon Nations M.D.   On: 05/28/2018 15:17   Ct L-spine No Charge  Result Date: 05/28/2018 CLINICAL  DATA:  Low back pain EXAM: CT LUMBAR SPINE WITHOUT CONTRAST TECHNIQUE: Multidetector CT imaging of the lumbar spine was performed without intravenous contrast administration. Multiplanar CT image reconstructions were also generated. COMPARISON:  Lumbar MRI 01/29/2018 FINDINGS: Segmentation: Normal Alignment: Normal Vertebrae: Chronic compression fracture T11 unchanged -Mild fracture superior endplate of T97 appears acute -Inferior endplate compression fracture of L3 is new since the prior study. Sclerotic bone changes is present suggesting healing from recent fracture -Moderately severe compression fracture of L4 has progressed since the prior study with evidence of bone healing -Moderate compression fracture L5 is new since the prior MRI and shows evidence of  healing with sclerotic changes. Paraspinal and other soft tissues: Bibasilar atelectasis. Atherosclerotic aorta. No paraspinous soft tissue swelling. Disc levels: T12-L1: Negative L1-2: Negative L2-3: Mild disc bulging and mild facet degeneration without significant stenosis L3-4: Disc degeneration and disc bulging with spurring. Bilateral mild facet degeneration. No significant stenosis L4-5: Disc degeneration with disc bulging and endplate spurring. Mild facet degeneration. No significant spinal stenosis L5-S1: Mild disc degeneration without stenosis. IMPRESSION: Acute compression fracture of T12 Subacute compression fractures of L3, L4, and L5. Chronic fracture of T11 Multilevel degenerative changes in the lumbar spine without significant stenosis. Atherosclerotic aorta Electronically Signed   By: Franchot Gallo M.D.   On: 05/28/2018 14:43     Scheduled Meds: . folic acid  1 mg Oral Daily  . multivitamin with minerals  1 tablet Oral Daily  . senna-docusate  2 tablet Oral BID  . sodium chloride flush  3 mL Intravenous Q12H   Continuous Infusions: . sodium chloride       LOS: 0 days   Time Spent in minutes   30 minutes  Myrene Bougher D.O. on 05/30/2018 at 12:59 PM  Between 7am to 7pm - Please see pager noted on [email or web address]  After 7pm go to [email or web address]  And look for the night coverage person covering for me after hours  [facility] Office  [phone]

## 2018-05-30 NOTE — Progress Notes (Signed)
Hematology short note  . CBC Latest Ref Rng & Units 05/30/2018 05/29/2018 05/28/2018  WBC 4.0 - 10.5 K/uL 11.2(H) 11.4(H) 10.4  Hemoglobin 12.0 - 15.0 g/dL 9.2(L) 8.1(L) 4.8(LL)  Hematocrit 36.0 - 46.0 % 27.5(L) 24.1(L) 14.6(L)  Platelets 150 - 400 K/uL 430(H) 390 612(H)    Labs reviewed hgb stable and improved at 9.2. No new recommendations. No indication for additional transfusion at this time. Stay Warm.Sullivan Lone

## 2018-05-31 DIAGNOSIS — D591 Other autoimmune hemolytic anemias: Secondary | ICD-10-CM | POA: Diagnosis not present

## 2018-05-31 DIAGNOSIS — D649 Anemia, unspecified: Secondary | ICD-10-CM | POA: Diagnosis not present

## 2018-05-31 LAB — CBC
HEMATOCRIT: 22.8 % — AB (ref 36.0–46.0)
HEMOGLOBIN: 7.4 g/dL — AB (ref 12.0–15.0)
MCH: 33.3 pg (ref 26.0–34.0)
MCHC: 32.5 g/dL (ref 30.0–36.0)
MCV: 102.7 fL — ABNORMAL HIGH (ref 78.0–100.0)
Platelets: 321 10*3/uL (ref 150–400)
RBC: 2.22 MIL/uL — ABNORMAL LOW (ref 3.87–5.11)
RDW: 24.5 % — ABNORMAL HIGH (ref 11.5–15.5)
WBC: 7.3 10*3/uL (ref 4.0–10.5)

## 2018-05-31 LAB — BASIC METABOLIC PANEL
Anion gap: 9 (ref 5–15)
BUN: 13 mg/dL (ref 8–23)
CHLORIDE: 101 mmol/L (ref 98–111)
CO2: 31 mmol/L (ref 22–32)
CREATININE: 0.43 mg/dL — AB (ref 0.44–1.00)
Calcium: 7.9 mg/dL — ABNORMAL LOW (ref 8.9–10.3)
GFR calc non Af Amer: >60 mL/min (ref >60)
Glucose, Bld: 96 mg/dL (ref 70–99)
Potassium: 3.5 mmol/L (ref 3.5–5.1)
Sodium: 141 mmol/L (ref 135–145)

## 2018-05-31 MED ORDER — LORAZEPAM 2 MG/ML IJ SOLN
1.0000 mg | Freq: Once | INTRAMUSCULAR | Status: AC
  Administered 2018-05-31: 1 mg via INTRAVENOUS
  Filled 2018-05-31: qty 1

## 2018-05-31 MED ORDER — SODIUM CHLORIDE 0.9% IV SOLUTION: Freq: Once | INTRAVENOUS | Status: DC

## 2018-05-31 NOTE — Progress Notes (Signed)
PROGRESS NOTE    [name]  TIR:[id number] DOB: December 24, 1940 DOA: 05/28/2018 PCP: [name], MD   Brief Narrative:  HPI On 05/28/2018 by Dr. Roxan Hockey [name]  is a 77 y.o. 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 Initially patient thought she may have had a fever couple days ago on further questioning both patient and mother and sister all deny fevers lately No vomiting, no diarrhea, no sick contacts, no productive cough, no hematochezia, no melena Patient had epidural steroid shots last week for back pain,   Interim history Admitted for back pain found to have an acute T12 compression fracture.  IR as well as orthopedic surgery consulted.  Planning for vertebral augmentation. Assessment & Plan   Acute T12 compression fracture, subacute fractures of L3-4 and 5 -T abdomen pelvis showed acute fracture of T12 -Orthopedic surgery consulted and appreciated -Interventional radiology consulted for possible kyphoplasty and recommended MRI of the spine. Recommending vertebral augmentation for relief of pain. -MRI lumbar spine showed mild superior endplate fracture of Q76 appears acute.  Mild retropulsion of the bone into the canal without significant stenosis.  Chronic fracture of T11.  Subacute fractures of L3-4 and 5. -Continue pain control (worsens with movement) -Will need PT/OT eventually  Chronic cold agglutinin disease/autoimmune hemolytic anemia -Oncology, Dr. Claiborne Billings consulted and appreciated -Hemoglobin on admission 4.8 -Patient transfused 2 units PRBC on admission -Hemoglobin down to 7.4 -will transfuse 1uPRBC  Hypokalemia -Resolved with replacement, continue to monitor BMP -magnesium checked, 1.8  DVT  Prophylaxis  SCDs  Code Status: Full  Family Communication: None at bedside  Disposition Plan: Observation.  Did not feel the patient can be safely discharged given that her pain worsens with movement.  Will likely need to remain in the hospital until IR procedure.  Consultants Orthopedic surgery Interventional radiology Oncology  Procedures  None  Antibiotics   Anti-infectives (From admission, onward)   Start     Dose/Rate Route Frequency Ordered Stop   05/28/18 1800  ceFEPIme (MAXIPIME) 1 g in sodium chloride 0.9 % 100 mL IVPB  Status:  Discontinued     1 g 200 mL/hr over 30 Minutes Intravenous Every 12 hours 05/28/18 1728 05/28/18 1810      Subjective:   [name] seen and examined today.  Continues to complain of back pain, more with movement. Patient is only relieved by lying still in bed. Denies current chest pain, shortness of breath, abdominal pain, N/V/D/C.   Objective:   Vitals:   05/30/18 1344 05/30/18 2045 05/30/18 2300 05/31/18 0651  BP: (!) 142/54 (!) 135/56  132/60  Pulse: 74 75  65  Resp: 19 20  20   Temp: 98.6 F (37 C) 99.3 F (37.4 C) 98.4 F (36.9 C) 98.2 F (36.8 C)  TempSrc: Oral  Oral Oral  SpO2: 100% 96%  97%  Weight:      Height:        Intake/Output Summary (Last 24 hours) at 05/31/2018 1325 Last data filed at 05/31/2018 1006 Gross per 24 hour  Intake 1203 ml  Output 200 ml  Net 1003 ml   Filed Weights   05/28/18 1724  Weight: 42.2 kg   Exam  General: Well developed, well nourished, NAD, appears stated age  HEENT: NCAT, mucous membranes moist.   Neck: Supple  Cardiovascular: S1 S2 auscultated, RRR, no murmur  Respiratory: Clear to auscultation bilaterally with equal chest rise  Abdomen: Soft, nontender, nondistended, + bowel sounds  Extremities: warm dry without cyanosis clubbing or edema. Pain with movement of LE  Neuro: AAOx3, nonfocal  Psych: Normal affect and demeanor  Data Reviewed: I have personally  reviewed following labs and imaging studies  CBC: Recent Labs  Lab 05/28/18 1136 05/29/18 0411 05/30/18 0916 05/31/18 0426  WBC 10.4 11.4* 11.2* 7.3  NEUTROABS 7.4  --   --   --   HGB 4.8* 8.1* 9.2* 7.4*  HCT 14.6* 24.1* 27.5* 22.8*  MCV 109.0* 95.6 102.6* 102.7*  PLT 612* 390 430* 782   Basic Metabolic Panel: Recent Labs  Lab 05/28/18 1136 05/29/18 0411 05/30/18 0458 05/31/18 0426  NA 139 140 139 141  K 4.0 3.2* 3.6 3.5  CL 96* 97* 99 101  CO2 31 33* 30 31  GLUCOSE 117* 119* 92 96  BUN 25* 23 16 13   CREATININE 0.76 0.64 0.48 0.43*  CALCIUM 8.6* 8.1* 8.2* 7.9*  MG  --  1.8  --   --    GFR: Estimated Creatinine Clearance: 39.9 mL/min (A) (by C-G formula based on SCr of 0.43 mg/dL (L)). Liver Function Tests: Recent Labs  Lab 05/28/18 1136  AST 21  ALT 10  ALKPHOS 101  BILITOT 4.4*  PROT 6.6  ALBUMIN 3.7   No results for input(s): LIPASE, AMYLASE in the last 168 hours. No results for input(s): AMMONIA in the last 168 hours. Coagulation Profile: No results for input(s): INR, PROTIME in the last 168 hours. Cardiac Enzymes: No results for input(s): CKTOTAL, CKMB, CKMBINDEX, TROPONINI in the last 168 hours. BNP (last 3 results) No results for input(s): PROBNP in the last 8760 hours. HbA1C: No results for input(s): HGBA1C in the last 72 hours. CBG: No results for input(s): GLUCAP in the last 168 hours. Lipid Profile: No results for input(s): CHOL, HDL, LDLCALC, TRIG, CHOLHDL, LDLDIRECT in the last 72 hours. Thyroid Function Tests: No results for input(s): TSH, T4TOTAL, FREET4, T3FREE, THYROIDAB in the last 72 hours. Anemia Panel: Recent Labs    05/28/18 1743  RETICCTPCT 14.3*   Urine analysis:    Component Value Date/Time   COLORURINE YELLOW 05/28/2018 2055   APPEARANCEUR CLEAR 05/28/2018 2055   LABSPEC >1.046 (H) 05/28/2018 2055   PHURINE 6.0 05/28/2018 2055   GLUCOSEU NEGATIVE 05/28/2018 2055   HGBUR SMALL (A) 05/28/2018 2055   BILIRUBINUR  NEGATIVE 05/28/2018 2055   KETONESUR NEGATIVE 05/28/2018 2055   PROTEINUR NEGATIVE 05/28/2018 2055   NITRITE NEGATIVE 05/28/2018 2055   LEUKOCYTESUR NEGATIVE 05/28/2018 2055   Sepsis Labs: @LABRCNTIP (procalcitonin:4,lacticidven:4)  ) Recent Results (from the past 240 hour(s))  Culture, blood (Routine X 2) w Reflex to ID Panel     Status: None (Preliminary result)   Collection Time: 05/28/18  5:43 PM  Result Value Ref Range Status   Specimen Description   Final    BLOOD RIGHT ANTECUBITAL Performed at [facility], [place] [address] [place], [place] [address]    Special Requests   Final    BOTTLES DRAWN AEROBIC ONLY Blood Culture results may not be optimal due to an inadequate volume of blood received in culture bottles Performed at [place] [address] [place], [place] [address]    Culture   Final    NO GROWTH 3 DAYS Performed at [facility], [place] [address], [place], [place] [address]  Report Status PENDING  Incomplete  Culture, blood (Routine X 2) w Reflex to ID Panel     Status: None (Preliminary result)   Collection Time: 05/28/18  5:44 PM  Result Value Ref Range Status   Specimen Description   Final    BLOOD LEFT ANTECUBITAL Performed at [facility] [address] [place], [place] [address]    Special Requests   Final    BOTTLES DRAWN AEROBIC AND ANAEROBIC Blood Culture adequate volume Performed at [place] [address] [place], [place] [address]    Culture   Final    NO GROWTH 3 DAYS Performed at [facility], [place] [address] [place], [place] [address]    Report Status PENDING  Incomplete  Urine Culture     Status: None   Collection Time: 05/28/18  8:55 PM  Result Value Ref Range Status   Specimen Description   Final    URINE, CLEAN CATCH Performed at [facility], [place] [address], [place], [place] [address]    Special Requests   Final     Immunocompromised Performed at [facility], [place] [address] [place], [place] [address]    Culture   Final    NO GROWTH Performed at [facility], [place] [address], [place], [place] [address]    Report Status 05/30/2018 FINAL  Final      Radiology Studies: No results found.   Scheduled Meds: . sodium chloride   Intravenous Once  . folic acid  1 mg Oral Daily  . multivitamin with minerals  1 tablet Oral Daily  . senna-docusate  2 tablet Oral BID  . sodium chloride flush  3 mL Intravenous Q12H   Continuous Infusions: . sodium chloride       LOS: 0 days   Time Spent in minutes   30 minutes  Narelle Schoening D.O. on 05/31/2018 at 1:25 PM  Between 7am to 7pm - Please see pager noted on [email or web address]  After 7pm go to [email or web address]  And look for the night coverage person covering for me after hours  [facility] Office  [phone]

## 2018-05-31 NOTE — Progress Notes (Signed)
Patient sat up most of the day. Several visitors in today. Pt states being worried about what they want to do with her back. She's not sure what decision she should make about having it done.

## 2018-05-31 NOTE — Progress Notes (Signed)
Subjective: Patient is looking very comfortable today sitting up in a chair.  She states that she continues to have significant pain when she moves from a lying to seated position.   Objective: Vital signs in last 24 hours: Temp:  [98.2 F (36.8 C)-99.3 F (37.4 C)] 98.2 F (36.8 C) (09/08 0651) Pulse Rate:  [65-75] 65 (09/08 0651) Resp:  [19-20] 20 (09/08 0651) BP: (132-142)/(54-60) 132/60 (09/08 0651) SpO2:  [96 %-100 %] 97 % (09/08 0651)  Intake/Output from previous day: 09/07 0701 - 09/08 0700 In: 1320 [P.O.:1320] Out: 200 [Urine:200] Intake/Output this shift: No intake/output data recorded.  Recent Labs    05/28/18 1136 05/29/18 0411 05/30/18 0916 05/31/18 0426  HGB 4.8* 8.1* 9.2* 7.4*   Recent Labs    05/30/18 0916 05/31/18 0426  WBC 11.2* 7.3  RBC 2.68* 2.22*  HCT 27.5* 22.8*  PLT 430* 321   Recent Labs    05/30/18 0458 05/31/18 0426  NA 139 141  K 3.6 3.5  CL 99 101  CO2 30 31  BUN 16 13  CREATININE 0.48 0.43*  GLUCOSE 92 96  CALCIUM 8.2* 7.9*   No results for input(s): LABPT, INR in the last 72 hours.  Neurologically intact ABD soft Neurovascular intact Sensation intact distally Intact pulses distally Dorsiflexion/Plantar flexion intact      Assessment/Plan: 77 year old female with chronic back pain that had increasing back pain over the last couple of weeks.  She was admitted because of back pain and ongoing other medical issues.  CT and MRI are both suggestive of new compression fracture at the T12 level.  Interventional radiology has been consulted for discussion of possible vertebroplasty.  I will leave this decision up to them as to me the patient is continuing to hurt but also has started to mobilize to some degree.  Either way I will follow her in the office after her procedure or if she does not have a procedure after discharge.   Alta Corning 05/31/2018, 9:13 AM

## 2018-06-01 DIAGNOSIS — D649 Anemia, unspecified: Secondary | ICD-10-CM | POA: Diagnosis not present

## 2018-06-01 DIAGNOSIS — D591 Other autoimmune hemolytic anemias: Secondary | ICD-10-CM | POA: Diagnosis not present

## 2018-06-01 LAB — CBC
HEMATOCRIT: 28.3 % — AB (ref 36.0–46.0)
HEMOGLOBIN: 9 g/dL — AB (ref 12.0–15.0)
MCH: 30.4 pg (ref 26.0–34.0)
MCHC: 31.8 g/dL (ref 30.0–36.0)
MCV: 95.6 fL (ref 78.0–100.0)
Platelets: 320 10*3/uL (ref 150–400)
RBC: 2.96 MIL/uL — AB (ref 3.87–5.11)
RDW: 19.7 % — ABNORMAL HIGH (ref 11.5–15.5)
WBC: 6.7 10*3/uL (ref 4.0–10.5)

## 2018-06-01 LAB — TYPE AND SCREEN
ABO/RH(D): B POS
Antibody Screen: NEGATIVE
DAT, IgG: NEGATIVE
Unit division: 0
Unit division: 0
Unit division: 0
Unit division: 0

## 2018-06-01 LAB — BASIC METABOLIC PANEL
ANION GAP: 9 (ref 5–15)
BUN: 15 mg/dL (ref 8–23)
CHLORIDE: 102 mmol/L (ref 98–111)
CO2: 31 mmol/L (ref 22–32)
CREATININE: 0.41 mg/dL — AB (ref 0.44–1.00)
Calcium: 8 mg/dL — ABNORMAL LOW (ref 8.9–10.3)
GFR calc non Af Amer: >60 mL/min (ref >60)
Glucose, Bld: 102 mg/dL — ABNORMAL HIGH (ref 70–99)
Potassium: 3.4 mmol/L — ABNORMAL LOW (ref 3.5–5.1)
SODIUM: 142 mmol/L (ref 135–145)

## 2018-06-01 LAB — BPAM RBC
Blood Product Expiration Date: 201910062359
Blood Product Expiration Date: 201910072359
Blood Product Expiration Date: 201910092359
Blood Product Expiration Date: 201910092359
ISSUE DATE / TIME: 201909051757
ISSUE DATE / TIME: 201909052150
ISSUE DATE / TIME: 201909081624
Unit Type and Rh: 9500
Unit Type and Rh: 9500
Unit Type and Rh: 9500
Unit Type and Rh: 9500

## 2018-06-01 MED ORDER — LORAZEPAM 2 MG/ML IJ SOLN: 1.0000 mg | Freq: Once | INTRAMUSCULAR | Status: DC

## 2018-06-01 MED ORDER — POTASSIUM CHLORIDE CRYS ER 20 MEQ PO TBCR
40.0000 meq | EXTENDED_RELEASE_TABLET | Freq: Once | ORAL | Status: AC
  Administered 2018-06-01: 40 meq via ORAL
  Filled 2018-06-01: qty 2

## 2018-06-01 MED ORDER — CEFAZOLIN SODIUM-DEXTROSE 2-4 GM/100ML-% IV SOLN
2.0000 g | Freq: Once | INTRAVENOUS | Status: DC
  Filled 2018-06-01: qty 100

## 2018-06-01 NOTE — Progress Notes (Signed)
Patient with confusion and agitation during night shift. Multiple attempts to get out of chair and bed unassisted. Patient was also insisting that she was not in [facility] despite multiple conversations and reminders with nursing staff. MD on call was notified and new order received for IV ativan which was given and effective.

## 2018-06-01 NOTE — Progress Notes (Signed)
PROGRESS NOTE    ANITA LAGUNA  UJW:[id number] DOB: 30-Jan-1941 DOA: 05/28/2018 PCP: [name], MD   Brief Narrative:  HPI On 05/28/2018 by Dr. Roxan Hockey Analeya Luallen  is a 77 y.o. 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 Initially patient thought she may have had a fever couple days ago on further questioning both patient and mother and sister all deny fevers lately No vomiting, no diarrhea, no sick contacts, no productive cough, no hematochezia, no melena Patient had epidural steroid shots last week for back pain,   Interim history Admitted for back pain found to have an acute T12 compression fracture.  IR as well as orthopedic surgery consulted.  Planning for vertebral augmentation, pending authorization.   Assessment & Plan   Acute T12 compression fracture, subacute fractures of L3-4 and 5 -T abdomen pelvis showed acute fracture of T12 -Orthopedic surgery consulted and appreciated -Interventional radiology consulted for possible kyphoplasty and recommended MRI of the spine. Recommending vertebral augmentation for relief of pain. Pending authorization. -MRI lumbar spine showed mild superior endplate fracture of F62 appears acute.  Mild retropulsion of the bone into the canal without significant stenosis.  Chronic fracture of T11.  Subacute fractures of L3-4 and 5. -Continue pain control (worsens with movement) -Will need PT/OT eventually  Chronic cold agglutinin disease/autoimmune hemolytic anemia -Oncology, Dr. Claiborne Billings consulted and appreciated -Hemoglobin on admission 4.8 -Patient transfused 3 units PRBC on admission -Hemoglobin currently 9 -Continue to monitor   Hypokalemia -Will continue to replace and  monitor BMP  DVT Prophylaxis  SCDs  Code Status: Full  Family Communication: None at bedside  Disposition Plan: Observation.  Do not feel the patient can be safely discharged given that her pain worsens with movement.  Will likely need to remain in the hospital until IR procedure.  Consultants Orthopedic surgery Interventional radiology Oncology  Procedures  None  Antibiotics   Anti-infectives (From admission, onward)   Start     Dose/Rate Route Frequency Ordered Stop   05/28/18 1800  ceFEPIme (MAXIPIME) 1 g in sodium chloride 0.9 % 100 mL IVPB  Status:  Discontinued     1 g 200 mL/hr over 30 Minutes Intravenous Every 12 hours 05/28/18 1728 05/28/18 1810      Subjective:   Pernell Besson seen and examined today.  Currently denies back pain although not moving.  Denies current chest pain, shortness of breath, abdominal pain, nausea or vomiting, diarrhea or constipation.  Objective:   Vitals:   05/31/18 1715 05/31/18 1919 05/31/18 2110 06/01/18 0419  BP: (!) 121/49 (!) 126/55 (!) 128/51 (!) 126/56  Pulse: 80 72 72 74  Resp: 16 20 18 18   Temp: 98.1 F (36.7 C) 98.5 F (36.9 C) 98.6 F (37 C) 98.2 F (36.8 C)  TempSrc: Oral Oral Oral Oral  SpO2: 99% 98% 97% 95%  Weight:      Height:        Intake/Output Summary (Last 24 hours) at 06/01/2018 1125 Last data filed at 06/01/2018 0419 Gross per 24 hour  Intake 1308 ml  Output 5 ml  Net 1303 ml   Filed Weights   05/28/18 1724  Weight: 42.2 kg   Exam  General: Well developed, well nourished, NAD, appears stated age  HEENT: NCAT, mucous membranes moist.   Neck: Supple  Cardiovascular: S1 S2 auscultated, RRR, no murmur  Respiratory: Clear to auscultation bilaterally  Abdomen: Soft, nontender, nondistended, + bowel sounds  Extremities: warm dry without cyanosis clubbing or edema. Pain with movement of LE   Neuro: AAOx3, nonfocal  Psych: appropriate mood and affect   Data Reviewed: I have personally  reviewed following labs and imaging studies  CBC: Recent Labs  Lab 05/28/18 1136 05/29/18 0411 05/30/18 0916 05/31/18 0426 06/01/18 0436  WBC 10.4 11.4* 11.2* 7.3 6.7  NEUTROABS 7.4  --   --   --   --   HGB 4.8* 8.1* 9.2* 7.4* 9.0*  HCT 14.6* 24.1* 27.5* 22.8* 28.3*  MCV 109.0* 95.6 102.6* 102.7* 95.6  PLT 612* 390 430* 321 811   Basic Metabolic Panel: Recent Labs  Lab 05/28/18 1136 05/29/18 0411 05/30/18 0458 05/31/18 0426 06/01/18 0436  NA 139 140 139 141 142  K 4.0 3.2* 3.6 3.5 3.4*  CL 96* 97* 99 101 102  CO2 31 33* 30 31 31   GLUCOSE 117* 119* 92 96 102*  BUN 25* 23 16 13 15   CREATININE 0.76 0.64 0.48 0.43* 0.41*  CALCIUM 8.6* 8.1* 8.2* 7.9* 8.0*  MG  --  1.8  --   --   --    GFR: Estimated Creatinine Clearance: 39.9 mL/min (A) (by C-G formula based on SCr of 0.41 mg/dL (L)). Liver Function Tests: Recent Labs  Lab 05/28/18 1136  AST 21  ALT 10  ALKPHOS 101  BILITOT 4.4*  PROT 6.6  ALBUMIN 3.7   No results for input(s): LIPASE, AMYLASE in the last 168 hours. No results for input(s): AMMONIA in the last 168 hours. Coagulation Profile: No results for input(s): INR, PROTIME in the last 168 hours. Cardiac Enzymes: No results for input(s): CKTOTAL, CKMB, CKMBINDEX, TROPONINI in the last 168 hours. BNP (last 3 results) No results for input(s): PROBNP in the last 8760 hours. HbA1C: No results for input(s): HGBA1C in the last 72 hours. CBG: No results for input(s): GLUCAP in the last 168 hours. Lipid Profile: No results for input(s): CHOL, HDL, LDLCALC, TRIG, CHOLHDL, LDLDIRECT in the last 72 hours. Thyroid Function Tests: No results for input(s): TSH, T4TOTAL, FREET4, T3FREE, THYROIDAB in the last 72 hours. Anemia Panel: No results for input(s): VITAMINB12, FOLATE, FERRITIN, TIBC, IRON, RETICCTPCT in the last 72 hours. Urine analysis:    Component Value Date/Time   COLORURINE YELLOW 05/28/2018 2055   APPEARANCEUR CLEAR 05/28/2018 2055   LABSPEC  >1.046 (H) 05/28/2018 2055   PHURINE 6.0 05/28/2018 2055   GLUCOSEU NEGATIVE 05/28/2018 2055   HGBUR SMALL (A) 05/28/2018 2055   BILIRUBINUR NEGATIVE 05/28/2018 2055   KETONESUR NEGATIVE 05/28/2018 2055   PROTEINUR NEGATIVE 05/28/2018 2055   NITRITE NEGATIVE 05/28/2018 2055   LEUKOCYTESUR NEGATIVE 05/28/2018 2055   Sepsis Labs: @LABRCNTIP (procalcitonin:4,lacticidven:4)  ) Recent Results (from the past 240 hour(s))  Culture, blood (Routine X 2) w Reflex to ID Panel     Status: None (Preliminary result)   Collection Time: 05/28/18  5:43 PM  Result Value Ref Range Status   Specimen Description   Final    BLOOD RIGHT ANTECUBITAL Performed at [facility], [place] [address] [place], [place] [address]    Special Requests   Final    BOTTLES DRAWN AEROBIC ONLY Blood Culture results may not be optimal due to an inadequate volume of blood received in culture bottles Performed at [place] [address], [place], [place] [address]    Culture  Final    NO GROWTH 4 DAYS Performed at [facility], [place] [address], [place], [place] [address]    Report Status PENDING  Incomplete  Culture, blood (Routine X 2) w Reflex to ID Panel     Status: None (Preliminary result)   Collection Time: 05/28/18  5:44 PM  Result Value Ref Range Status   Specimen Description   Final    BLOOD LEFT ANTECUBITAL Performed at [place] [address] [place], [place] [address]    Special Requests   Final    BOTTLES DRAWN AEROBIC AND ANAEROBIC Blood Culture adequate volume Performed at [facility] [address] [place], [place] [address]    Culture   Final    NO GROWTH 4 DAYS Performed at [facility], [place] [address], [place], [place] [address]    Report Status PENDING  Incomplete  Urine Culture     Status: None   Collection Time: 05/28/18  8:55 PM  Result Value Ref Range Status   Specimen Description   Final      URINE, CLEAN CATCH Performed at [facility], [place] [address], [place], [place] [address]    Special Requests   Final    Immunocompromised Performed at [facility], [place] [address], [place], [place] [address]    Culture   Final    NO GROWTH Performed at [facility], [place] [address] [place], [place] [address]    Report Status 05/30/2018 FINAL  Final      Radiology Studies: No results found.   Scheduled Meds: . sodium chloride   Intravenous Once  . folic acid  1 mg Oral Daily  . multivitamin with minerals  1 tablet Oral Daily  . senna-docusate  2 tablet Oral BID  . sodium chloride flush  3 mL Intravenous Q12H   Continuous Infusions: . sodium chloride       LOS: 0 days   Time Spent in minutes   30 minutes  Kazzandra Desaulniers D.O. on 06/01/2018 at 11:25 AM  Between 7am to 7pm - Please see pager noted on [email or web address]  After 7pm go to [email or web address]  And look for the night coverage person covering for me after hours  [facility] Office  [phone]

## 2018-06-01 NOTE — Progress Notes (Signed)
Received call from Gregory, scheduler @ IR for possible Vertebroplasty/Kyphoplasty procedure, inquired about pt medical status to meet approval for procedure.  Tiffany states pt will need Inpatient status to meet approval criteria for procedure. Care Management and provider updated. SRP, RN

## 2018-06-01 NOTE — Progress Notes (Signed)
PT Cancellation Note  Patient Details Name: HAZELYN KALLEN MRN: [id number] DOB: 09/09/1941   Cancelled Treatment:     Will await IR  And ortho consult with recommendations.     Clide Dales 06/01/2018, 12:36 PM  Clide Dales, PT Pager: [phone] 06/01/2018

## 2018-06-01 NOTE — Progress Notes (Signed)
Patient for possible vertebroplasty/kyphoplasty tomorrow 9/10.  Insurance approval obtained once insurance company notified by ordering physician of inpatient status.  Orders placed for tomorrow.   Brynda [place]athouse, MS RD PA-C 5:06 PM

## 2018-06-02 DIAGNOSIS — R0902 Hypoxemia: Secondary | ICD-10-CM | POA: Diagnosis not present

## 2018-06-02 DIAGNOSIS — D649 Anemia, unspecified: Secondary | ICD-10-CM | POA: Diagnosis not present

## 2018-06-02 DIAGNOSIS — E876 Hypokalemia: Secondary | ICD-10-CM | POA: Diagnosis not present

## 2018-06-02 DIAGNOSIS — D591 Other autoimmune hemolytic anemias: Secondary | ICD-10-CM | POA: Diagnosis not present

## 2018-06-02 LAB — BASIC METABOLIC PANEL
Anion gap: 8 (ref 5–15)
BUN: 12 mg/dL (ref 8–23)
CHLORIDE: 102 mmol/L (ref 98–111)
CO2: 31 mmol/L (ref 22–32)
Calcium: 8.3 mg/dL — ABNORMAL LOW (ref 8.9–10.3)
Creatinine, Ser: 0.36 mg/dL — ABNORMAL LOW (ref 0.44–1.00)
GFR calc Af Amer: >60 mL/min (ref >60)
GFR calc non Af Amer: >60 mL/min (ref >60)
Glucose, Bld: 96 mg/dL (ref 70–99)
POTASSIUM: 4.3 mmol/L (ref 3.5–5.1)
Sodium: 141 mmol/L (ref 135–145)

## 2018-06-02 LAB — CULTURE, BLOOD (ROUTINE X 2)
CULTURE: NO GROWTH
Culture: NO GROWTH
SPECIAL REQUESTS: ADEQUATE

## 2018-06-02 LAB — CBC
HCT: 25.9 % — ABNORMAL LOW (ref 36.0–46.0)
Hemoglobin: 8.3 g/dL — ABNORMAL LOW (ref 12.0–15.0)
MCH: 31.2 pg (ref 26.0–34.0)
MCHC: 32 g/dL (ref 30.0–36.0)
MCV: 97.4 fL (ref 78.0–100.0)
Platelets: 336 10*3/uL (ref 150–400)
RBC: 2.66 MIL/uL — AB (ref 3.87–5.11)
RDW: 19.7 % — ABNORMAL HIGH (ref 11.5–15.5)
WBC: 6 10*3/uL (ref 4.0–10.5)

## 2018-06-02 LAB — PROTIME-INR
INR: 0.99
Prothrombin Time: 13 seconds (ref 11.4–15.2)

## 2018-06-02 LAB — MAGNESIUM: Magnesium: 2.1 mg/dL (ref 1.7–2.4)

## 2018-06-02 MED ORDER — TRAMADOL HCL 50 MG PO TABS: 50.0000 mg | ORAL_TABLET | Freq: Three times a day (TID) | ORAL | 0 refills | Status: DC | PRN

## 2018-06-02 MED ORDER — SENNOSIDES-DOCUSATE SODIUM 8.6-50 MG PO TABS: 2.0000 | ORAL_TABLET | Freq: Two times a day (BID) | ORAL | Status: DC

## 2018-06-02 MED ORDER — ADULT MULTIVITAMIN W/MINERALS CH: 1.0000 | ORAL_TABLET | Freq: Every day | ORAL | Status: DC

## 2018-06-02 NOTE — Evaluation (Signed)
Physical Therapy Evaluation Patient Details Name: [name] MRN: [id number] DOB: 09-19-1941 Today's Date: 06/02/2018   History of Present Illness  77 y.o. 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 Dx of acute T12 compression fracture, subacute fractures of L3-4 and 5.   Clinical Impression  Pt is modified independent with mobility, she ambulated 220' with RW with no loss of balance, SaO2 85% on room air after ambulating (pulse ox did not read during ambulation). Pt reported 5/10 back pain with mobility. She will need home O2, but no further PT indicated.     Follow Up Recommendations No PT follow up    Equipment Recommendations  Other (comment)(home O2)    Recommendations for Other Services       Precautions / Restrictions Precautions Precautions: Fall Precaution Comments: pt/family deny falls in past 1 year Restrictions Weight Bearing Restrictions: No      Mobility  Bed Mobility Overal bed mobility: Modified Independent             General bed mobility comments: HOB up 25*  Transfers Overall transfer level: Modified independent Equipment used: Rolling walker (2 wheeled)                Ambulation/Gait Ambulation/Gait assistance: Modified independent (Device/Increase time) Gait Distance (Feet): 220 Feet Assistive device: Rolling walker (2 wheeled) Gait Pattern/deviations: Step-through pattern Gait velocity: WFL   General Gait Details: steady with RW, no loss of balance, no dyspnea, HR 109, SaO2 85% on room air after 2 minutes seated rest after walking (pulse ox didn't read during ambulation), sats came up to 94% on 2L O2 while seated  Stairs            Wheelchair Mobility    Modified Rankin  (Stroke Patients Only)       Balance Overall balance assessment: Modified Independent                                           Pertinent Vitals/Pain Pain Assessment: 0-10 Pain Score: 5  Pain Location: back Pain Descriptors / Indicators: Sore Pain Intervention(s): Limited activity within patient's tolerance;Premedicated before session;Repositioned    Home Living Family/patient expects to be discharged to:: Private residence Living Arrangements: Parent Available Help at Discharge: Family Type of Home: House Home Access: Stairs to enter   Technical brewer of Steps: 1 Home Layout: One level Home Equipment: Environmental consultant - 2 wheels;Cane - single point;Shower seat      Prior Function           Comments: walked with SPC, did sponge bathes sometimes with help     Hand Dominance        Extremity/Trunk Assessment   Upper Extremity Assessment Upper Extremity Assessment: Overall WFL for tasks assessed    Lower Extremity Assessment Lower Extremity Assessment: Overall WFL for tasks assessed(B knee ext 4/5)    Cervical / Trunk Assessment Cervical / Trunk Assessment: Kyphotic  Communication   Communication: No difficulties  Cognition Arousal/Alertness: Awake/alert Behavior During Therapy: WFL for tasks assessed/performed Overall Cognitive Status: Within Functional Limits for tasks assessed  General Comments      Exercises     Assessment/Plan    PT Assessment Patent does not need any further PT services  PT Problem List         PT Treatment Interventions      PT Goals (Current goals can be found in the Care Plan section)  Acute Rehab PT Goals PT Goal Formulation: All assessment and education complete, DC therapy    Frequency     Barriers to discharge        Co-evaluation               AM-PAC PT "6 Clicks" Daily Activity  Outcome Measure Difficulty turning over in bed  (including adjusting bedclothes, sheets and blankets)?: A Little Difficulty moving from lying on back to sitting on the side of the bed? : A Little Difficulty sitting down on and standing up from a chair with arms (e.g., wheelchair, bedside commode, etc,.)?: None Help needed moving to and from a bed to chair (including a wheelchair)?: None Help needed walking in hospital room?: None Help needed climbing 3-5 steps with a railing? : A Little 6 Click Score: 21    End of Session Equipment Utilized During Treatment: Gait belt;Oxygen Activity Tolerance: Patient tolerated treatment well Patient left: in chair;with call bell/phone within reach;with family/visitor present Nurse Communication: Mobility status      Time: 9021-1155 PT Time Calculation (min) (ACUTE ONLY): 25 min   Charges:   PT Evaluation $PT Eval Low Complexity: 1 Low PT Treatments $Gait Training: 8-22 mins          Blondell Reveal Kistler 06/02/2018, 10:14 AM [phone]

## 2018-06-02 NOTE — Progress Notes (Signed)
Patient offered PRN pain med for transportation home. Pt has stated pain is a "10/10" with movement. Pt refused. Transported via family to home. Eulas Post, RN

## 2018-06-02 NOTE — Progress Notes (Addendum)
Spoke with pt, her mother and sister at beside concerning discharge plan, observation and insurance not covering procedure related to this being an outpatient procedure. Pt's mother states, "I am 77 years old. These dam doctors don't know what they are doing.  The nurses, dietary, environment service have been nice. We have waste 6 days here. I still work and I have missed days from work"  This CM stated, "I am sorry, we can not change you (PT) from observation to IP because you do not qualify for IP status. There are no acute treatments, the procedure is concerned OP." The pt's sister states, " I Don't believe any of you care."  Both mother and sister were very upset because the pt did not qualify for IP which could not be changed. Spoke with attending MD, UR RN, UR AD, Medical Director, concerning this case, pt still do not met qualifications for IP.  Explained all this information to pt, mother and sister this information plus this would be fraud if we change pt to IP from obs without any qualifications. Pt's mother states that pt live with her and declined [facility] at present time. Everyone including attending MD has worked hard to please this pt and family. However, the pt did not meet Medicare guidelines for IP status. When Pt and family are upset it is hard for them to understand that this facility operate under the Federal Medicare guidelines to prevent Medicare fraud.

## 2018-06-02 NOTE — Progress Notes (Signed)
SATURATION QUALIFICATIONS: (This note is used to comply with regulatory documentation for home oxygen)  Patient Saturations on Room Air at Rest = 85%  Patient Saturations on Room Air while Ambulating = pulse ox did not read   Patient Saturations on 2 Liters of oxygen at rest = 94%  Please briefly explain why patient needs home oxygen: to maintain appropriate SaO2 levels.   Blondell Reveal Kistler PT 06/02/2018  [phone]

## 2018-06-02 NOTE — Discharge Summary (Addendum)
Physician Discharge Summary  [name] EUM:[id number] DOB: 30-Mar-1941 DOA: 05/28/2018  PCP: [name], MD  Admit date: 05/28/2018 Discharge date: 06/02/2018  Time spent: 45 minutes  Recommendations for Outpatient Follow-up:  Patient will be discharged to home.  Patient will need to follow up with primary care provider within one week of discharge, repeat CBC and BMP.  Follow up with Dr. Irene Limbo, oncology. Follow with interventional radiology. Patient should continue medications as prescribed.  Patient should follow a regular diet.   Discharge Diagnoses:  Acute T12 compression fracture, subacute fractures of L3-4 and 5 Chronic cold agglutinin disease/autoimmune hemolytic anemi Hypokalemia Acute hypoxia  Discharge Condition: Stable  Diet recommendation: regular  Filed Weights   05/28/18 1724  Weight: 42.2 kg    History of present illness:  On 05/28/2018 by Dr. 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 Initially patient thought she may have had a fever couple days ago on further questioning both patient and mother and sister all deny fevers lately No vomiting, no diarrhea, no sick contacts, no productive cough, no hematochezia, no melena Patient had epidural steroid shots last week for back pain,  Hospital Course:  Acute T12 compression fracture, subacute fractures of L3-4 and 5 -T abdomen pelvis showed acute fracture of T12 -Orthopedic surgery consulted and appreciated -Interventional radiology consulted for possible kyphoplasty and recommended MRI of the spine. Recommending vertebral augmentation for relief of pain. Pending authorization- however cannot obtain authorization  as patient remained in observation status -MRI lumbar spine showed mild superior endplate fracture of V40 appears acute.  Mild retropulsion of the bone into the canal without significant stenosis.  Chronic fracture of T11.  Subacute fractures of L3-4 and 5. -Continue pain control (worsens with movement) -PT evaluated patient, no further needs  -Of note, patient and family (including mother and sister) were very upset that patient was not in inpatient status.  Chronic cold agglutinin disease/autoimmune hemolytic anemia -Oncology, Dr. Claiborne Billings consulted and appreciated -Hemoglobin on admission 4.8 -Patient transfused 3 units PRBC on admission -Hemoglobin currently 8.3 -Continue to monitor   Acute hypoxia -patient's oxygen saturation dropped to 85% on room air at rest -Will discharge patient with 2L home oxygen -CXR showed left basilar opacity -Currently afebrile with no leukocytosis; blood cultures show no growth -case management consulted  Hypokalemia -Resolved, potassium 4.3  Consultants Orthopedic surgery Interventional radiology Oncology  Procedures  None  Discharge Exam: Vitals:   06/02/18 0517 06/02/18 0536  BP: 103/68 (!) 141/61  Pulse: 77 83  Resp: 12 18  Temp: 98.3 F (36.8 C)   SpO2: 95% 96%     General: Well developed, well nourished, NAD, appears stated age  HEENT: NCAT, PERRLA, EOMI, Anicteic Sclera, mucous membranes moist.  Neck: Supple, no JVD, no masses  Cardiovascular: S1 S2 auscultated, no rubs, murmurs or gallops. Regular rate and rhythm.  Respiratory: Clear to auscultation bilaterally with equal chest rise  Abdomen: Soft, nontender, nondistended, + bowel sounds  Extremities: warm dry without cyanosis clubbing or edema  Neuro: AAOx3, cranial nerves grossly intact. Strength 5/5 in patient's upper and lower extremities bilaterally  Skin: Without rashes exudates or nodules  Psych: Normal affect and demeanor with intact judgement and  insight  Discharge Instructions Discharge Instructions    Discharge instructions   Complete by:  As directed  Patient will need to follow up with primary care provider within one week of discharge, repeat CBC and BMP.  Follow up with Dr. Irene Limbo, oncology. Follow with interventional radiology. Patient should continue medications as prescribed.  Patient should follow a regular diet.     Allergies as of 06/02/2018      Reactions   Other Other (See Comments)   "Most antibiotics give yeast infection"   Shellfish Allergy Hives      Medication List    TAKE these medications   acetaminophen 325 MG tablet Commonly known as:  TYLENOL Take 2 tablets (650 mg total) by mouth every 6 (six) hours as needed for mild pain (or Fever >/= 101).   Cyanocobalamin 1000 MCG Tbcr TAKE 1 TABLET DAILY   docusate sodium 100 MG capsule Commonly known as:  COLACE Take 1 capsule (100 mg total) by mouth daily.   folic acid 1 MG tablet Commonly known as:  FOLVITE Take 1 tablet (1 mg total) by mouth daily.   multivitamin with minerals Tabs tablet Take 1 tablet by mouth daily. Start taking on:  06/03/2018   potassium chloride SA 20 MEQ tablet Commonly known as:  K-DUR,KLOR-CON 40 meq PO twice daily for 3 days then 47meq po daily. Please repeat potassium levels with PCP in 1 month to address additional potassium.   senna-docusate 8.6-50 MG tablet Commonly known as:  Senokot-S Take 2 tablets by mouth 2 (two) times daily.   traMADol 50 MG tablet Commonly known as:  ULTRAM Take 1 tablet (50 mg total) by mouth every 8 (eight) hours as needed.   VISINE OP Apply 1 drop to eye daily as needed (allergies/dry eyes).            Durable Medical Equipment  (From admission, onward)         Start     Ordered   06/02/18 1034  DME Oxygen  Once    Question Answer Comment  Mode or (Route) Nasal cannula   Frequency Continuous (stationary and portable oxygen unit needed)   Oxygen conserving device Yes    Oxygen delivery system Gas      06/02/18 1033         Allergies  Allergen Reactions  . Other Other (See Comments)    "Most antibiotics give yeast infection"  . Shellfish Allergy Hives   Follow-up Information    Dorna Leitz, MD Follow up.   Specialty:  Orthopedic Surgery Why:  The patient should call us at any point should pain worsen or her situation worsen when she is discharged from the hospital. Contact information: [place] [address] [phone]        [name], MD. Schedule an appointment as soon as possible for a visit in 1 week(s).   Specialty:  Internal Medicine Why:  Hospital follow up Contact information: [place], [place] [address] [phone]        Brunetta Genera, MD. Schedule an appointment as soon as possible for a visit in 1 week(s).   Specialties:  Hematology, Oncology Why:  Hospital follow up Contact information: [place] [address] [phone]        Jacqulynn Cadet, MD. Call.   Specialties:  Interventional Radiology, Radiology Why:  Call Interventional radiology: [phone] for an appointment  Contact information: [address] [place] [address] [phone]            The results of significant diagnostics from this hospitalization (  including imaging, microbiology, ancillary and laboratory) are listed below for reference.    Significant Diagnostic Studies: Dg Chest 2 View  Result Date: 05/28/2018 CLINICAL DATA:  Hypoxia. EXAM: CHEST - 2 VIEW COMPARISON:  06/12/2016 and prior radiographs FINDINGS: UPPER limits normal heart size again noted. Mild peribronchial thickening is unchanged. LEFT basilar noted. No definite pleural effusion or pneumothorax. No acute bony abnormalities are present. IMPRESSION: LEFT basilar opacity which may represent airspace disease or atelectasis. Radiographic follow-up to resolution recommended. Electronically  Signed   By: Margarette Canada M.D.   On: 05/28/2018 12:49   Ct Angio Chest Pe W And/or Wo Contrast  Result Date: 05/28/2018 CLINICAL DATA:  "Pt complains of lower back pain and left hip pain. Pt states pain has been worse since receiving and epidural a week ago " EXAM: CT ANGIOGRAPHY CHEST CT ABDOMEN AND PELVIS WITH CONTRAST TECHNIQUE: Multidetector CT imaging of the chest was performed using the standard protocol during bolus administration of intravenous contrast. Multiplanar CT image reconstructions and MIPs were obtained to evaluate the vascular anatomy. Multidetector CT imaging of the abdomen and pelvis was performed using the standard protocol during bolus administration of intravenous contrast. CONTRAST:  182mL ISOVUE-370 IOPAMIDOL (ISOVUE-370) INJECTION 76% COMPARISON:  02/04/2018 FINDINGS: CTA CHEST FINDINGS Cardiovascular: The heart size is UPPER normal. No significant coronary artery calcifications. No pericardial effusion. There is atherosclerotic calcification of the thoracic aorta not associated with aneurysm. The appearance of the pulmonary arteries is unremarkable. The arteries are well opacified and there is no acute pulmonary embolus. Mediastinum/Nodes: The visualized portion of the thyroid gland has a normal appearance. The esophagus is normal in appearance. No mediastinal, hilar, or axillary adenopathy. Lungs/Pleura: There are small bilateral pleural effusions. There is bibasilar atelectasis. Scattered emphysematous changes are identified primarily in the UPPER lobes. Airways are patent. No focal consolidations. Within the RIGHT UPPER lobe there is a spiculated 6 millimeter mass best seen on image 47 of series 4 a 4 millimeter nodule is identified in the RIGHT UPPER lobe on image 64 of series 4. Musculoskeletal: Remote sternal fracture. Degenerative changes are identified in the thoracic spine. Acute compression fracture of T12. Review of the MIP images confirms the above findings. CT ABDOMEN and  PELVIS FINDINGS Hepatobiliary: The liver is homogeneous. There is mild periportal edema. No intra or extrahepatic biliary duct dilatation. Gallbladder contains several calcified stones. No pericholecystic fluid. Pancreas: Atrophic and otherwise normal appearance of the pancreas. Spleen: Normal in size without focal abnormality. Adrenals/Urinary Tract: Adrenal glands are normal in appearance. There is symmetric enhancement and excretion of the kidneys. The bladder and visualized portion of the urethra are normal. Stomach/Bowel: The stomach and bowel loops are normal in appearance. There are numerous colonic diverticula. There is thickening of the sigmoid segment which may be related to chronic diverticulosis. No associated inflammatory changes to indicate presence of acute diverticulitis. Vascular/Lymphatic: There is atherosclerotic calcification of the abdominal aorta not associated with aneurysm. Although involved by atherosclerosis, there is vascular opacification of the celiac axis, superior mesenteric artery, and inferior mesenteric artery. Normal appearance of the portal venous system and inferior vena cava. No retroperitoneal or mesenteric adenopathy. CT Reproductive: The uterus is surgically absent.  No adnexal mass. Other: No free pelvic fluid. Anterior abdominal wall is unremarkable. Musculoskeletal: Acute superior endplate fracture of J62. Chronic anterior wedge deformities of T11. Subacute compression fractures of L3, L4, and L5. Remote ORIF of the LEFT hip. No joint effusion or evidence for dislocation. Review of the MIP images confirms  the above findings. IMPRESSION: 1. Acute fracture of T12. 2. No acute pulmonary embolus. 3. Spiculated pulmonary nodule in the RIGHT UPPER lobe measuring 6 millimeters. Morphology is suspicious and further evaluation is recommended. Recommend noncontrast CT of the chest in 3 months. 4.  Emphysema (ICD10-J43.9). 5. Subacute or chronic fractures of T11, L3, L4, and L5.  Remote sternal fracture. 6. Mild periportal edema of uncertain etiology. 7. Colonic diverticulosis. Thickening of the sigmoid segment of the colon may be related to chronic diverticulosis. There is no evidence for acute inflammatory change. 8. Hysterectomy. These results were called by telephone at the time of interpretation on 05/28/2018 at 3:15 pm to Dr. Rodell Perna , who verbally acknowledged these results. Electronically Signed   By: Nolon Nations M.D.   On: 05/28/2018 15:17   Mr Lumbar Spine Wo Contrast  Result Date: 05/29/2018 CLINICAL DATA:  Multiple fractures.  Assess for kyphoplasty. EXAM: MRI LUMBAR SPINE WITHOUT CONTRAST TECHNIQUE: Multiplanar, multisequence MR imaging of the lumbar spine was performed. No intravenous contrast was administered. COMPARISON:  CT lumbar 05/28/2018, MRI lumbar 01/29/2018 FINDINGS: Segmentation:  Normal Alignment:  Normal Vertebrae: Mild superior endplate fracture X21 with bone marrow edema. This appears acute based on MRI and CT. Mild retropulsion into the canal without significant stenosis. Chronic fracture T11 Mild fracture of L3 vertebral body appears subacute Moderate subacute fracture of L4 Moderate subacute fracture of L5 Bone marrow is diffusely abnormal with low signal on T1 and T2. No focal lesion. Conus medullaris and cauda equina: Conus extends to the L1-2 level. Conus and cauda equina appear normal. Paraspinal and other soft tissues: Negative for paraspinous mass. Mild paraspinous soft tissue thickening/hematoma related to the T12 fracture. Disc levels: T11-12: Mild retropulsion of T12 into the canal due to fracture without significant stenosis T12-L1: Negative L1-2: Negative L2-3: Mild disc and mild facet degeneration without significant stenosis. L3-4: Disc degeneration with diffuse disc bulging and spurring. Bilateral facet hypertrophy. Mild spinal stenosis and mild subarticular stenosis bilaterally L4-5: Disc degeneration and endplate spurring. Bilateral  facet hypertrophy. Mild spinal stenosis L5-S1: Mild disc and mild facet degeneration without stenosis. IMPRESSION: Mild superior endplate fracture J94 appears acute. Mild retropulsion of bone into the canal without significant stenosis Chronic fracture T11 Subacute fractures of L3, L4, and L5 unchanged from recent  CT. Diffuse bone marrow abnormality. Question anemia or myeloproliferative disorder. Correlate with CBC. Electronically Signed   By: Franchot Gallo M.D.   On: 05/29/2018 11:31   Ct Abdomen Pelvis W Contrast  Result Date: 05/28/2018 CLINICAL DATA:  "Pt complains of lower back pain and left hip pain. Pt states pain has been worse since receiving and epidural a week ago " EXAM: CT ANGIOGRAPHY CHEST CT ABDOMEN AND PELVIS WITH CONTRAST TECHNIQUE: Multidetector CT imaging of the chest was performed using the standard protocol during bolus administration of intravenous contrast. Multiplanar CT image reconstructions and MIPs were obtained to evaluate the vascular anatomy. Multidetector CT imaging of the abdomen and pelvis was performed using the standard protocol during bolus administration of intravenous contrast. CONTRAST:  177mL ISOVUE-370 IOPAMIDOL (ISOVUE-370) INJECTION 76% COMPARISON:  02/04/2018 FINDINGS: CTA CHEST FINDINGS Cardiovascular: The heart size is UPPER normal. No significant coronary artery calcifications. No pericardial effusion. There is atherosclerotic calcification of the thoracic aorta not associated with aneurysm. The appearance of the pulmonary arteries is unremarkable. The arteries are well opacified and there is no acute pulmonary embolus. Mediastinum/Nodes: The visualized portion of the thyroid gland has a normal appearance. The esophagus is normal  in appearance. No mediastinal, hilar, or axillary adenopathy. Lungs/Pleura: There are small bilateral pleural effusions. There is bibasilar atelectasis. Scattered emphysematous changes are identified primarily in the UPPER lobes. Airways  are patent. No focal consolidations. Within the RIGHT UPPER lobe there is a spiculated 6 millimeter mass best seen on image 47 of series 4 a 4 millimeter nodule is identified in the RIGHT UPPER lobe on image 64 of series 4. Musculoskeletal: Remote sternal fracture. Degenerative changes are identified in the thoracic spine. Acute compression fracture of T12. Review of the MIP images confirms the above findings. CT ABDOMEN and PELVIS FINDINGS Hepatobiliary: The liver is homogeneous. There is mild periportal edema. No intra or extrahepatic biliary duct dilatation. Gallbladder contains several calcified stones. No pericholecystic fluid. Pancreas: Atrophic and otherwise normal appearance of the pancreas. Spleen: Normal in size without focal abnormality. Adrenals/Urinary Tract: Adrenal glands are normal in appearance. There is symmetric enhancement and excretion of the kidneys. The bladder and visualized portion of the urethra are normal. Stomach/Bowel: The stomach and bowel loops are normal in appearance. There are numerous colonic diverticula. There is thickening of the sigmoid segment which may be related to chronic diverticulosis. No associated inflammatory changes to indicate presence of acute diverticulitis. Vascular/Lymphatic: There is atherosclerotic calcification of the abdominal aorta not associated with aneurysm. Although involved by atherosclerosis, there is vascular opacification of the celiac axis, superior mesenteric artery, and inferior mesenteric artery. Normal appearance of the portal venous system and inferior vena cava. No retroperitoneal or mesenteric adenopathy. CT Reproductive: The uterus is surgically absent.  No adnexal mass. Other: No free pelvic fluid. Anterior abdominal wall is unremarkable. Musculoskeletal: Acute superior endplate fracture of H06. Chronic anterior wedge deformities of T11. Subacute compression fractures of L3, L4, and L5. Remote ORIF of the LEFT hip. No joint effusion or  evidence for dislocation. Review of the MIP images confirms the above findings. IMPRESSION: 1. Acute fracture of T12. 2. No acute pulmonary embolus. 3. Spiculated pulmonary nodule in the RIGHT UPPER lobe measuring 6 millimeters. Morphology is suspicious and further evaluation is recommended. Recommend noncontrast CT of the chest in 3 months. 4.  Emphysema (ICD10-J43.9). 5. Subacute or chronic fractures of T11, L3, L4, and L5. Remote sternal fracture. 6. Mild periportal edema of uncertain etiology. 7. Colonic diverticulosis. Thickening of the sigmoid segment of the colon may be related to chronic diverticulosis. There is no evidence for acute inflammatory change. 8. Hysterectomy. These results were called by telephone at the time of interpretation on 05/28/2018 at 3:15 pm to Dr. Rodell Perna , who verbally acknowledged these results. Electronically Signed   By: Nolon Nations M.D.   On: 05/28/2018 15:17   Ct L-spine No Charge  Result Date: 05/28/2018 CLINICAL DATA:  Low back pain EXAM: CT LUMBAR SPINE WITHOUT CONTRAST TECHNIQUE: Multidetector CT imaging of the lumbar spine was performed without intravenous contrast administration. Multiplanar CT image reconstructions were also generated. COMPARISON:  Lumbar MRI 01/29/2018 FINDINGS: Segmentation: Normal Alignment: Normal Vertebrae: Chronic compression fracture T11 unchanged -Mild fracture superior endplate of C37 appears acute -Inferior endplate compression fracture of L3 is new since the prior study. Sclerotic bone changes is present suggesting healing from recent fracture -Moderately severe compression fracture of L4 has progressed since the prior study with evidence of bone healing -Moderate compression fracture L5 is new since the prior MRI and shows evidence of healing with sclerotic changes. Paraspinal and other soft tissues: Bibasilar atelectasis. Atherosclerotic aorta. No paraspinous soft tissue swelling. Disc levels: T12-L1: Negative L1-2: Negative  L2-3:  Mild disc bulging and mild facet degeneration without significant stenosis L3-4: Disc degeneration and disc bulging with spurring. Bilateral mild facet degeneration. No significant stenosis L4-5: Disc degeneration with disc bulging and endplate spurring. Mild facet degeneration. No significant spinal stenosis L5-S1: Mild disc degeneration without stenosis. IMPRESSION: Acute compression fracture of T12 Subacute compression fractures of L3, L4, and L5. Chronic fracture of T11 Multilevel degenerative changes in the lumbar spine without significant stenosis. Atherosclerotic aorta Electronically Signed   By: Franchot Gallo M.D.   On: 05/28/2018 14:43    Microbiology: Recent Results (from the past 240 hour(s))  Culture, blood (Routine X 2) w Reflex to ID Panel     Status: None   Collection Time: 05/28/18  5:43 PM  Result Value Ref Range Status   Specimen Description   Final    BLOOD RIGHT ANTECUBITAL Performed at [facility] [address] [place], [place] [address]    Special Requests   Final    BOTTLES DRAWN AEROBIC ONLY Blood Culture results may not be optimal due to an inadequate volume of blood received in culture bottles Performed at [facility] [address], [place], [place] [address]    Culture   Final    NO GROWTH 5 DAYS Performed at [facility], [place] [address] [place], [place] [address]    Report Status 06/02/2018 FINAL  Final  Culture, blood (Routine X 2) w Reflex to ID Panel     Status: None   Collection Time: 05/28/18  5:44 PM  Result Value Ref Range Status   Specimen Description   Final    BLOOD LEFT ANTECUBITAL Performed at [facility] [address], [place], [place] [address]    Special Requests   Final    BOTTLES DRAWN AEROBIC AND ANAEROBIC Blood Culture adequate volume Performed at [facility] [address] [place], [place] [address]    Culture   Final    NO GROWTH 5  DAYS Performed at [facility], [place] [address], [place], [place] [address]    Report Status 06/02/2018 FINAL  Final  Urine Culture     Status: None   Collection Time: 05/28/18  8:55 PM  Result Value Ref Range Status   Specimen Description   Final    URINE, CLEAN CATCH Performed at [facility], [place] [address], [place], [place] [address]    Special Requests   Final    Immunocompromised Performed at [facility], [place] [address], [place], [place] [address]    Culture   Final    NO GROWTH Performed at [facility], [place] [address], [place], [place] [address]    Report Status 05/30/2018 FINAL  Final     Labs: Basic Metabolic Panel: Recent Labs  Lab 05/29/18 0411 05/30/18 0458 05/31/18 0426 06/01/18 0436 06/02/18 0401  NA 140 139 141 142 141  K 3.2* 3.6 3.5 3.4* 4.3  CL 97* 99 101 102 102  CO2 33* 30 31 31 31   GLUCOSE 119* 92 96 102* 96  BUN 23 16 13 15 12   CREATININE 0.64 0.48 0.43* 0.41* 0.36*  CALCIUM 8.1* 8.2* 7.9* 8.0* 8.3*  MG 1.8  --   --   --  2.1   Liver Function Tests: Recent Labs  Lab 05/28/18 1136  AST 21  ALT 10  ALKPHOS 101  BILITOT 4.4*  PROT 6.6  ALBUMIN 3.7  No results for input(s): LIPASE, AMYLASE in the last 168 hours. No results for input(s): AMMONIA in the last 168 hours. CBC: Recent Labs  Lab 05/28/18 1136 05/29/18 0411 05/30/18 0916 05/31/18 0426 06/01/18 0436 06/02/18 0401  WBC 10.4 11.4* 11.2* 7.3 6.7 6.0  NEUTROABS 7.4  --   --   --   --   --   HGB 4.8* 8.1* 9.2* 7.4* 9.0* 8.3*  HCT 14.6* 24.1* 27.5* 22.8* 28.3* 25.9*  MCV 109.0* 95.6 102.6* 102.7* 95.6 97.4  PLT 612* 390 430* 321 320 336   Cardiac Enzymes: No results for input(s): CKTOTAL, CKMB, CKMBINDEX, TROPONINI in the last 168 hours. BNP: BNP (last 3 results) No results for input(s): BNP in the last 8760 hours.  ProBNP (last 3 results) No results for input(s): PROBNP in the last 8760 hours.  CBG: No results for  input(s): GLUCAP in the last 168 hours.     Signed:  Cristal Ford  [facility] 06/02/2018, 10:46 AM

## 2018-06-02 NOTE — Discharge Instructions (Signed)
Spinal Compression Fracture A spinal compression fracture is a collapse of the bones that form the spine (vertebrae). With this type of fracture, the vertebrae become squashed (compressed) into a wedge shape. Most compression fractures happen in the middle or lower part of the spine. What are the causes? This condition may be caused by:  Thinning and loss of density in the bones (osteoporosis). This is the most common cause.  A fall.  A car or motorcycle accident.  Cancer.  Trauma, such as a heavy, direct hit to the head.  What increases the risk? You may be at greater risk for a spinal compression fracture if you:  Are 50 years old or older.  Have osteoporosis.  Have certain types of cancer, including: ? Multiple myeloma. ? Lymphoma. ? Prostate cancer. ? Lung cancer. ? Breast cancer.  What are the signs or symptoms? Symptoms of this condition include:  Severe pain.  Pain that gets worse over time.  Pain that is worse when you stand, walk, sit, or bend.  Sudden pain that is so bad that it is hard for you to move.  Bending or humping of the spine.  Gradual loss of height.  Numbness, tingling, or weakness in the back and legs.  Trouble walking.  Your symptoms will depend on the cause of the fracture and how quickly it develops. For example, fractures that are caused by osteoporosis can cause few symptoms, no symptoms, or symptoms that develop slowly over time. How is this diagnosed? This condition may be diagnosed based on symptoms, medical history, and a physical exam. During the physical exam, your health care provider may tap along the length of your spine to check for tenderness. Tests may be done to confirm the diagnosis. They may include:  A bone density test to check for osteoporosis.  Imaging tests, such as a spine X-ray, a CT scan, or MRI.  How is this treated? Treatment for this condition depends on the cause and severity of the condition.Some  fractures, such as those that are caused by osteoporosis, may heal on their own with supportive care. This may include:  Pain medicine.  Rest.  A back brace.  Physical therapy exercises.  Medicine that reduces bone pain.  Calcium and vitamin D supplements.  Fractures that cause the back to become misshapen, cause nerve pain or weakness, or do not respond to other treatment may be treated with a surgical procedure, such as:  Vertebroplasty. In this procedure, bone cement is injected into the collapsed vertebrae to stabilize them.  Balloon kyphoplasty. In this procedure, the collapsed vertebrae are expanded with a balloon and then bone cement is injected into them.  Spinal fusion. In this procedure, the collapsed vertebrae are connected (fused) to normal vertebrae.  Follow these instructions at home: General instructions  Take medicines only as directed by your health care provider.  Do not drive or operate heavy machinery while taking pain medicine.  If directed, apply ice to the injured area: ? Put ice in a plastic bag. ? Place a towel between your skin and the bag. ? Leave the ice on for 30 minutes every two hours at first. Then apply the ice as needed.  Wear your neck brace or back brace as directed by your health care provider.  Do not drink alcohol. Alcohol can interfere with your treatment.  Keep all follow-up visits as directed by your health care provider. This is important. It can help to prevent permanent injury, disability, and long-lasting (chronic) pain.   Activity  Stay in bed (on bed rest) only as directed by your health care provider. Being on bed rest for too long can make your condition worse.  Return to your normal activities as directed by your health care provider. Ask what activities are safe for you.  Do exercises to improve motion and strength in your back (physical therapy), as recommended by your health care provider.  Exercise regularly as  directed by your health care provider. Contact a health care provider if:  You have a fever.  You develop a cough that makes your pain worse.  Your pain medicine is not helping.  Your pain does not get better over time.  You cannot return to your normal activities as planned or expected. Get help right away if:  Your pain is very bad and it suddenly gets worse.  You are unable to move any body part (paralysis) that is below the level of your injury.  You have numbness, tingling, or weakness in any body part that is below the level of your injury.  You cannot control your bladder or bowels. This information is not intended to replace advice given to you by your health care provider. Make sure you discuss any questions you have with your health care provider. Document Released: 09/09/2005 Document Revised: 05/07/2016 Document Reviewed: 09/13/2014 Elsevier Interactive Patient Education  [employer].

## 2018-06-05 ENCOUNTER — Telehealth (HOSPITAL_COMMUNITY): Payer: Self-pay

## 2018-06-05 NOTE — Telephone Encounter (Signed)
Called to schedule procedure, no answer, left vm on mobile. Home vm full. AW

## 2018-06-08 ENCOUNTER — Other Ambulatory Visit (HOSPITAL_COMMUNITY): Payer: Self-pay | Admitting: Family Medicine

## 2018-06-08 ENCOUNTER — Other Ambulatory Visit: Payer: Self-pay | Admitting: Student

## 2018-06-08 DIAGNOSIS — S22080A Wedge compression fracture of T11-T12 vertebra, initial encounter for closed fracture: Secondary | ICD-10-CM

## 2018-06-09 ENCOUNTER — Ambulatory Visit (HOSPITAL_COMMUNITY): Admission: RE | Admit: 2018-06-09 | Payer: Medicare Other | Source: Ambulatory Visit

## 2018-06-11 ENCOUNTER — Telehealth (HOSPITAL_COMMUNITY): Payer: Self-pay

## 2018-06-11 NOTE — Telephone Encounter (Signed)
Called to inform pt's daughter that the procedure was denied by the insurance company. Daughter was ok with the decision. I gave her the option to call and appeal but they are going to see if the fracture will heal for now and try not to do any bending, lifting, or stooping until then. AW

## 2018-06-13 ENCOUNTER — Other Ambulatory Visit: Payer: Self-pay | Admitting: Hematology

## 2018-06-13 DIAGNOSIS — D5912 Cold autoimmune hemolytic anemia: Secondary | ICD-10-CM

## 2018-06-13 DIAGNOSIS — D591 Other autoimmune hemolytic anemias: Principal | ICD-10-CM

## 2018-06-15 ENCOUNTER — Telehealth: Payer: Self-pay

## 2018-06-15 ENCOUNTER — Other Ambulatory Visit: Payer: Self-pay

## 2018-06-15 DIAGNOSIS — D5912 Cold autoimmune hemolytic anemia: Secondary | ICD-10-CM

## 2018-06-15 DIAGNOSIS — D591 Autoimmune hemolytic anemia, unspecified: Secondary | ICD-10-CM

## 2018-06-15 MED ORDER — FOLIC ACID 1 MG PO TABS: 2.0000 mg | ORAL_TABLET | Freq: Every day | ORAL | 3 refills | Status: DC

## 2018-06-15 MED ORDER — FOLIC ACID 1 MG PO TABS: 1.0000 mg | ORAL_TABLET | Freq: Every day | ORAL | 3 refills | Status: DC

## 2018-06-15 NOTE — Telephone Encounter (Signed)
Confirmed 2mg  refill of folic acid received by [facility] on [facility]. LVM on pt phone that folic acid refill sent to [facility]. Noted change of dose. Pt to see PCP to review potassium lab and review oral potassium.

## 2018-06-17 NOTE — Progress Notes (Signed)
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 The patient's last visit with [place] was on 04/17/18. She is accompanied today by her sister. The pt reports that she is doing well overall.   The pt is awaiting 06/22/18 IR Kypho thoracic with one biopsy. She was admitted to the hospital in the interim for acute T12 compression fracture and subacute fractures of L3, L4 and L5 and was discharged with 2L home oxygen for an acute hypoxia that developed in admission. She notes that she has needed to use her oxygen for an hour each day, if that. She presents without oxygen today and her O2 sat is at 99% in clinic. She denies SOB and CP. She denies having follow up with pulmonology, nor a plan for when to get off oxygen.   The pt notes that she is currently taking potassium replacement, but is unsure how much she is taking. She was taking steroids in the interim.   The pt reports that she has not developed any other concerns in the interim.   Lab results today (06/18/18) of CBC w/diff, CMP, and Reticulocytes is as follows: all values are WNL except for WBC at 12.0k, RBC at 2.41, HGB at 7.9, HCT at 24.7, MCV at 102.5, RDW at 18.3, PLT at 608k, ANC at 9.5k, Potassium at 3.1, Glucose at 136, Calcium at 8.7, Total Protein at 6.4, Alk Phos at 167, Total Bilirubin at 2.6, Retic ct pct at 4.7%, Retic ct abs at 113.3k. 06/18/18 LDH is at 404  On review of systems, pt reports stable energy levels, controlled back pain, minimal ankle swelling, and denies SOB, CP, difficulty breathing, abdominal pains, light headedness, dizziness, and any other symptoms.   REVIEW OF  SYSTEMS:    A 10+ POINT REVIEW OF SYSTEMS WAS OBTAINED including neurology, dermatology, psychiatry, cardiac, respiratory, lymph, extremities, GI, GU, Musculoskeletal, constitutional, breasts, reproductive, HEENT.  All pertinent positives are noted in the HPI.  All others are negative.   Past Medical History:  Diagnosis Date  . B12 deficiency   . Bilateral carotid artery stenosis    right CCA and ICA 1-39%/    left ICA 40-59%  per last duplex  10-19-2015  . COPD (chronic obstructive pulmonary disease) ([place])   . Diverticulosis of colon   . History of diverticulitis of colon    2004  . 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 Renal and ureteric calculus    left    . Past Surgical History:  Procedure Laterality Date  . ABDOMINAL HYSTERECTOMY  1986  . APPENDECTOMY  1967  . BREAST EXCISIONAL BIOPSY Right 1984  . CATARACT EXTRACTION W/ INTRAOCULAR LENS IMPLANT Right 1990's   congential cataract  . CYSTOSCOPY W/ URETERAL STENT PLACEMENT  11/12/2015   Procedure: CYSTOSCOPY WITH RETROGRADE PYELOGRAM/URETERAL STENT PLACEMENT;  Surgeon: [name], MD;  Location: [facility];  Service: Urology;;  . Consuela Mimes W/ URETERAL STENT REMOVAL Left 11/22/2015   Procedure: CYSTOSCOPY WITH STENT REMOVAL;  Surgeon: [name], MD;  Location: [facility];  Service: Urology;  Laterality: Left;  . CYSTOSCOPY/URETEROSCOPY/HOLMIUM LASER Left 11/22/2015   Procedure: CYSTOSCOPY/URETEROSCOPY/HOLMIUM  LASER;  Surgeon: [name], MD;  Location: [facility];  Service: Urology;  Laterality: Left;  FLEX URETEROSCOPY  . FEMUR IM NAIL Left 08/04/2015   Procedure: INTRAMEDULLARY (IM) NAIL FEMORAL;  Surgeon: [name], MD;  Location: [place];  Service: Orthopedics;  Laterality: Left;  . TONSILLECTOMY  age 64    . Social History   Tobacco Use  . Smoking status: Former Smoker    Packs/day: 1.00    Types: Cigarettes    Last attempt to quit:  08/03/2015    Years since quitting: 2.8  . Smokeless tobacco: Never Used  Substance Use Topics  . Alcohol use: Yes    Comment: occ  . Drug use: Never    ALLERGIES:  is allergic to other and shellfish allergy.  MEDICATIONS:  Current Outpatient Medications  Medication Sig Dispense Refill  . acetaminophen (TYLENOL) 325 MG tablet Take 2 tablets (650 mg total) by mouth every 6 (six) hours as needed for mild pain (or Fever >/= 101).    . Cyanocobalamin ([employer] B-12) 1000 MCG TBCR TAKE 1 TABLET DAILY 30 tablet 11  . docusate sodium (COLACE) 100 MG capsule Take 1 capsule (100 mg total) by mouth daily.    . folic acid (FOLVITE) 1 MG tablet Take 2 tablets (2 mg total) by mouth daily. 90 tablet 3  . Multiple Vitamin (MULTIVITAMIN WITH MINERALS) TABS tablet Take 1 tablet by mouth daily.    [name] senna-docusate (SENOKOT-S) 8.6-50 MG tablet Take 2 tablets by mouth 2 (two) times daily.    . Tetrahydrozoline HCl (VISINE OP) Apply 1 drop to eye daily as needed (allergies/dry eyes).    . traMADol (ULTRAM) 50 MG tablet Take 1 tablet (50 mg total) by mouth every 8 (eight) hours as needed. 20 tablet 0   No current facility-administered medications for this visit.     PHYSICAL EXAMINATION: ECOG PERFORMANCE STATUS: 1 - Symptomatic but completely ambulatory  Vitals:   06/18/18 1102  BP: (!) 140/53  Pulse: 73  Resp: 18  Temp: 98.2 F (36.8 C)  SpO2: 99%    Filed Weights   06/18/18 1102  Weight: 86 lb 4.8 oz (39.1 kg)   .Body mass index is 16.85 kg/m.  GENERAL:alert, in no acute distress and comfortable SKIN: no acute rashes, no significant lesions EYES: conjunctiva are pink and non-injected, sclera anicteric OROPHARYNX: MMM, no exudates, no oropharyngeal erythema or ulceration NECK: supple, no JVD LYMPH:  no palpable lymphadenopathy in the cervical, axillary or inguinal regions LUNGS: clear to auscultation b/l with normal respiratory effort HEART: regular rate & rhythm ABDOMEN:  normoactive  bowel sounds , non tender, not distended. No palpable hepatosplenomegaly.  Extremity: no pedal edema PSYCH: alert & oriented x 3 with fluent speech NEURO: no focal motor/sensory deficits   LABORATORY DATA:   I have reviewed the data as listed  . CBC Latest Ref Rng & Units 06/18/2018 06/02/2018 06/01/2018  WBC 3.9 - 10.3 K/uL 12.0(H) 6.0 6.7  Hemoglobin 11.6 - 15.9 g/dL 7.9(L) 8.3(L) 9.0(L)  Hematocrit 34.8 - 46.6 % 24.7(L) 25.9(L) 28.3(L)  Platelets 145 - 400 K/uL 608(H) 336 320    CMP Latest Ref Rng & Units 06/18/2018 06/02/2018 06/01/2018  Glucose 70 - 99 mg/dL 136(H) 96 102(H)  BUN 8 - 23 mg/dL '8 12 15  ' Creatinine 0.44 - 1.00 mg/dL 0.63 0.36(L) 0.41(L)  Sodium 135 - 145 mmol/L 143 141 142  Potassium 3.5 - 5.1 mmol/L 3.1(L) 4.3 3.4(L)  Chloride 98 - 111 mmol/L  103 102 102  CO2 22 - 32 mmol/L '31 31 31  ' Calcium 8.9 - 10.3 mg/dL 8.7(L) 8.3(L) 8.0(L)  Total Protein 6.5 - 8.1 g/dL 6.4(L) - -  Total Bilirubin 0.3 - 1.2 mg/dL 2.6(H) - -  Alkaline Phos 38 - 126 U/L 167(H) - -  AST 15 - 41 U/L 15 - -  ALT 0 - 44 U/L 10 - -   . Lab Results  Component Value Date   LDH 404 (H) 06/18/2018          RADIOGRAPHIC STUDIES: I have personally reviewed the radiological images as listed and agreed with the findings in the report. Dg Chest 2 View  Result Date: 05/28/2018 CLINICAL DATA:  Hypoxia. EXAM: CHEST - 2 VIEW COMPARISON:  06/12/2016 and prior radiographs FINDINGS: UPPER limits normal heart size again noted. Mild peribronchial thickening is unchanged. LEFT basilar noted. No definite pleural effusion or pneumothorax. No acute bony abnormalities are present. IMPRESSION: LEFT basilar opacity which may represent airspace disease or atelectasis. Radiographic follow-up to resolution recommended. Electronically Signed   By: Margarette Canada M.D.   On: 05/28/2018 12:49   Ct Angio Chest Pe W And/or Wo Contrast  Result Date: 05/28/2018 CLINICAL DATA:  "Pt complains of lower back pain and left hip pain.  Pt states pain has been worse since receiving and epidural a week ago " EXAM: CT ANGIOGRAPHY CHEST CT ABDOMEN AND PELVIS WITH CONTRAST TECHNIQUE: Multidetector CT imaging of the chest was performed using the standard protocol during bolus administration of intravenous contrast. Multiplanar CT image reconstructions and MIPs were obtained to evaluate the vascular anatomy. Multidetector CT imaging of the abdomen and pelvis was performed using the standard protocol during bolus administration of intravenous contrast. CONTRAST:  163m ISOVUE-370 IOPAMIDOL (ISOVUE-370) INJECTION 76% COMPARISON:  02/04/2018 FINDINGS: CTA CHEST FINDINGS Cardiovascular: The heart size is UPPER normal. No significant coronary artery calcifications. No pericardial effusion. There is atherosclerotic calcification of the thoracic aorta not associated with aneurysm. The appearance of the pulmonary arteries is unremarkable. The arteries are well opacified and there is no acute pulmonary embolus. Mediastinum/Nodes: The visualized portion of the thyroid gland has a normal appearance. The esophagus is normal in appearance. No mediastinal, hilar, or axillary adenopathy. Lungs/Pleura: There are small bilateral pleural effusions. There is bibasilar atelectasis. Scattered emphysematous changes are identified primarily in the UPPER lobes. Airways are patent. No focal consolidations. Within the RIGHT UPPER lobe there is a spiculated 6 millimeter mass best seen on image 47 of series 4 a 4 millimeter nodule is identified in the RIGHT UPPER lobe on image 64 of series 4. Musculoskeletal: Remote sternal fracture. Degenerative changes are identified in the thoracic spine. Acute compression fracture of T12. Review of the MIP images confirms the above findings. CT ABDOMEN and PELVIS FINDINGS Hepatobiliary: The liver is homogeneous. There is mild periportal edema. No intra or extrahepatic biliary duct dilatation. Gallbladder contains several calcified stones. No  pericholecystic fluid. Pancreas: Atrophic and otherwise normal appearance of the pancreas. Spleen: Normal in size without focal abnormality. Adrenals/Urinary Tract: Adrenal glands are normal in appearance. There is symmetric enhancement and excretion of the kidneys. The bladder and visualized portion of the urethra are normal. Stomach/Bowel: The stomach and bowel loops are normal in appearance. There are numerous colonic diverticula. There is thickening of the sigmoid segment which may be related to chronic diverticulosis. No associated inflammatory changes to indicate presence of acute diverticulitis. Vascular/Lymphatic: There is atherosclerotic calcification of the abdominal aorta not associated with aneurysm. Although  involved by atherosclerosis, there is vascular opacification of the celiac axis, superior mesenteric artery, and inferior mesenteric artery. Normal appearance of the portal venous system and inferior vena cava. No retroperitoneal or mesenteric adenopathy. CT Reproductive: The uterus is surgically absent.  No adnexal mass. Other: No free pelvic fluid. Anterior abdominal wall is unremarkable. Musculoskeletal: Acute superior endplate fracture of N98. Chronic anterior wedge deformities of T11. Subacute compression fractures of L3, L4, and L5. Remote ORIF of the LEFT hip. No joint effusion or evidence for dislocation. Review of the MIP images confirms the above findings. IMPRESSION: 1. Acute fracture of T12. 2. No acute pulmonary embolus. 3. Spiculated pulmonary nodule in the RIGHT UPPER lobe measuring 6 millimeters. Morphology is suspicious and further evaluation is recommended. Recommend noncontrast CT of the chest in 3 months. 4.  Emphysema (ICD10-J43.9). 5. Subacute or chronic fractures of T11, L3, L4, and L5. Remote sternal fracture. 6. Mild periportal edema of uncertain etiology. 7. Colonic diverticulosis. Thickening of the sigmoid segment of the colon may be related to chronic diverticulosis.  There is no evidence for acute inflammatory change. 8. Hysterectomy. These results were called by telephone at the time of interpretation on 05/28/2018 at 3:15 pm to Dr. Rodell Perna , who verbally acknowledged these results. Electronically Signed   By: Nolon Nations M.D.   On: 05/28/2018 15:17   Mr Lumbar Spine Wo Contrast  Result Date: 05/29/2018 CLINICAL DATA:  Multiple fractures.  Assess for kyphoplasty. EXAM: MRI LUMBAR SPINE WITHOUT CONTRAST TECHNIQUE: Multiplanar, multisequence MR imaging of the lumbar spine was performed. No intravenous contrast was administered. COMPARISON:  CT lumbar 05/28/2018, MRI lumbar 01/29/2018 FINDINGS: Segmentation:  Normal Alignment:  Normal Vertebrae: Mild superior endplate fracture X21 with bone marrow edema. This appears acute based on MRI and CT. Mild retropulsion into the canal without significant stenosis. Chronic fracture T11 Mild fracture of L3 vertebral body appears subacute Moderate subacute fracture of L4 Moderate subacute fracture of L5 Bone marrow is diffusely abnormal with low signal on T1 and T2. No focal lesion. Conus medullaris and cauda equina: Conus extends to the L1-2 level. Conus and cauda equina appear normal. Paraspinal and other soft tissues: Negative for paraspinous mass. Mild paraspinous soft tissue thickening/hematoma related to the T12 fracture. Disc levels: T11-12: Mild retropulsion of T12 into the canal due to fracture without significant stenosis T12-L1: Negative L1-2: Negative L2-3: Mild disc and mild facet degeneration without significant stenosis. L3-4: Disc degeneration with diffuse disc bulging and spurring. Bilateral facet hypertrophy. Mild spinal stenosis and mild subarticular stenosis bilaterally L4-5: Disc degeneration and endplate spurring. Bilateral facet hypertrophy. Mild spinal stenosis L5-S1: Mild disc and mild facet degeneration without stenosis. IMPRESSION: Mild superior endplate fracture J94 appears acute. Mild retropulsion of bone  into the canal without significant stenosis Chronic fracture T11 Subacute fractures of L3, L4, and L5 unchanged from recent  CT. Diffuse bone marrow abnormality. Question anemia or myeloproliferative disorder. Correlate with CBC. Electronically Signed   By: Franchot Gallo M.D.   On: 05/29/2018 11:31   Ct Abdomen Pelvis W Contrast  Result Date: 05/28/2018 CLINICAL DATA:  "Pt complains of lower back pain and left hip pain. Pt states pain has been worse since receiving and epidural a week ago " EXAM: CT ANGIOGRAPHY CHEST CT ABDOMEN AND PELVIS WITH CONTRAST TECHNIQUE: Multidetector CT imaging of the chest was performed using the standard protocol during bolus administration of intravenous contrast. Multiplanar CT image reconstructions and MIPs were obtained to evaluate the vascular anatomy. Multidetector CT imaging  of the abdomen and pelvis was performed using the standard protocol during bolus administration of intravenous contrast. CONTRAST:  158m ISOVUE-370 IOPAMIDOL (ISOVUE-370) INJECTION 76% COMPARISON:  02/04/2018 FINDINGS: CTA CHEST FINDINGS Cardiovascular: The heart size is UPPER normal. No significant coronary artery calcifications. No pericardial effusion. There is atherosclerotic calcification of the thoracic aorta not associated with aneurysm. The appearance of the pulmonary arteries is unremarkable. The arteries are well opacified and there is no acute pulmonary embolus. Mediastinum/Nodes: The visualized portion of the thyroid gland has a normal appearance. The esophagus is normal in appearance. No mediastinal, hilar, or axillary adenopathy. Lungs/Pleura: There are small bilateral pleural effusions. There is bibasilar atelectasis. Scattered emphysematous changes are identified primarily in the UPPER lobes. Airways are patent. No focal consolidations. Within the RIGHT UPPER lobe there is a spiculated 6 millimeter mass best seen on image 47 of series 4 a 4 millimeter nodule is identified in the RIGHT  UPPER lobe on image 64 of series 4. Musculoskeletal: Remote sternal fracture. Degenerative changes are identified in the thoracic spine. Acute compression fracture of T12. Review of the MIP images confirms the above findings. CT ABDOMEN and PELVIS FINDINGS Hepatobiliary: The liver is homogeneous. There is mild periportal edema. No intra or extrahepatic biliary duct dilatation. Gallbladder contains several calcified stones. No pericholecystic fluid. Pancreas: Atrophic and otherwise normal appearance of the pancreas. Spleen: Normal in size without focal abnormality. Adrenals/Urinary Tract: Adrenal glands are normal in appearance. There is symmetric enhancement and excretion of the kidneys. The bladder and visualized portion of the urethra are normal. Stomach/Bowel: The stomach and bowel loops are normal in appearance. There are numerous colonic diverticula. There is thickening of the sigmoid segment which may be related to chronic diverticulosis. No associated inflammatory changes to indicate presence of acute diverticulitis. Vascular/Lymphatic: There is atherosclerotic calcification of the abdominal aorta not associated with aneurysm. Although involved by atherosclerosis, there is vascular opacification of the celiac axis, superior mesenteric artery, and inferior mesenteric artery. Normal appearance of the portal venous system and inferior vena cava. No retroperitoneal or mesenteric adenopathy. CT Reproductive: The uterus is surgically absent.  No adnexal mass. Other: No free pelvic fluid. Anterior abdominal wall is unremarkable. Musculoskeletal: Acute superior endplate fracture of TI01 Chronic anterior wedge deformities of T11. Subacute compression fractures of L3, L4, and L5. Remote ORIF of the LEFT hip. No joint effusion or evidence for dislocation. Review of the MIP images confirms the above findings. IMPRESSION: 1. Acute fracture of T12. 2. No acute pulmonary embolus. 3. Spiculated pulmonary nodule in the RIGHT  UPPER lobe measuring 6 millimeters. Morphology is suspicious and further evaluation is recommended. Recommend noncontrast CT of the chest in 3 months. 4.  Emphysema (ICD10-J43.9). 5. Subacute or chronic fractures of T11, L3, L4, and L5. Remote sternal fracture. 6. Mild periportal edema of uncertain etiology. 7. Colonic diverticulosis. Thickening of the sigmoid segment of the colon may be related to chronic diverticulosis. There is no evidence for acute inflammatory change. 8. Hysterectomy. These results were called by telephone at the time of interpretation on 05/28/2018 at 3:15 pm to Dr. MRodell Perna, who verbally acknowledged these results. Electronically Signed   By: ENolon NationsM.D.   On: 05/28/2018 15:17   Ct L-spine No Charge  Result Date: 05/28/2018 CLINICAL DATA:  Low back pain EXAM: CT LUMBAR SPINE WITHOUT CONTRAST TECHNIQUE: Multidetector CT imaging of the lumbar spine was performed without intravenous contrast administration. Multiplanar CT image reconstructions were also generated. COMPARISON:  Lumbar MRI 01/29/2018 FINDINGS:  Segmentation: Normal Alignment: Normal Vertebrae: Chronic compression fracture T11 unchanged -Mild fracture superior endplate of W09 appears acute -Inferior endplate compression fracture of L3 is new since the prior study. Sclerotic bone changes is present suggesting healing from recent fracture -Moderately severe compression fracture of L4 has progressed since the prior study with evidence of bone healing -Moderate compression fracture L5 is new since the prior MRI and shows evidence of healing with sclerotic changes. Paraspinal and other soft tissues: Bibasilar atelectasis. Atherosclerotic aorta. No paraspinous soft tissue swelling. Disc levels: T12-L1: Negative L1-2: Negative L2-3: Mild disc bulging and mild facet degeneration without significant stenosis L3-4: Disc degeneration and disc bulging with spurring. Bilateral mild facet degeneration. No significant stenosis L4-5:  Disc degeneration with disc bulging and endplate spurring. Mild facet degeneration. No significant spinal stenosis L5-S1: Mild disc degeneration without stenosis. IMPRESSION: Acute compression fracture of T12 Subacute compression fractures of L3, L4, and L5. Chronic fracture of T11 Multilevel degenerative changes in the lumbar spine without significant stenosis. Atherosclerotic aorta Electronically Signed   By: Franchot Gallo M.D.   On: 05/28/2018 14:43    ASSESSMENT & PLAN:   #1: Cold Agglutinin related chronic hemolytic anemia. Her hemoglobin tends to be lower during winter and has been in the 6-8 range. Overall stable hemoglobin levels in the 6-7 range with folic acid replacement and cold avoidance.  #2 IgM lambda MGUS - cannot rule out possibility of lymphoplasmacytic lymphoma though less likely given chronicity of her condition.  #3 history of previous B12 deficiency  Plan:  -Continue to avoid cold aggressively -Pt needs a transfusion to keep Hgb at least above 6 -ALL TRANSFUSION PRODUCTS SHOULD BE PRE-WARMED USING A BLOOD WARMER INCLUDING IV FLUIDS, IF ANY. -Discussed pt labwork today, 06/18/18; HGB slowly trending down to 7.9, PLT at 608k, ANC at 9.5k. Potassium at 3.1, LDH decreased to 404. Vitamin B12 is pending -Recommend that the pt reconnect with her PCP regarding her oxygen evaluation and pt will check O2 sats with pulse oximeter  -No indication for transfusion today -Will order potassium replacement today  -Continue with Folic acid and Vitamin B12 replacement  -Encouraged pt to begin supplements like Boost or Ensure and increase her intake  -Will see the pt back in one month, sooner if any new concerns   #4 hypokalemia K 3.1 -Will refill Potassium, begin taking BID with orange juice, and pt completed her prednisone course so I expect her potassium levels to increase   #5. Low back pain -compression fracture L4 -MRI L spine -Acute/subacute superior endplate fracture at L4  with loss of height of 10-20%. No retropulsed bone. This is quite likely the cause of the low back pain. This was probably present on the radiographs of 01/20/2018, but not diagnosable because of the spinal curvature and unlevel endplates. Old healed superior endplate fracture at W11 Plan -Continue follow up with IR for kypho thoracic  -prn Tramadol for back pain   #6 Patient Active Problem List   Diagnosis Date Noted  . Symptomatic anemia 05/28/2018  . 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 Occult blood in stools 02/04/2018  . Ureteral calculus 11/12/2015  . Breast cancer screening, high risk patient 08/29/2015  . Acute delirium 08/07/2015  . Postoperative anemia due to acute blood loss 08/06/2015  . Protein-calorie malnutrition, severe 08/04/2015  . Closed intertrochanteric fracture of left femur ([place]) 08/03/2015  . Fall 08/03/2015  . Closed right hip fracture ([place]) 08/03/2015  .  Hyperbilirubinemia 08/01/2013  . Hypokalemia 07/30/2013  . Vitamin B 12 deficiency 08/07/2012  . 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 Tobacco abuse 06/10/2012   -Continue follow-up with primary care physician for management of other medical comorbidities  -ALL TRANSFUSION PRODUCTS SHOULD BE PRE-WARMED USING A BLOOD WARMER INCLUDING IV FLUIDS, IF ANY.    RTC with Dr Irene Limbo with labs in 4 weeks    The total time spent in the appt was 25 minutes and more than 50% was on counseling and direct patient cares.    Sullivan Lone MD MS AAHIVMS [facility] Hematology/Oncology Physician [facility]  (Office):       [phone] (Work cell):  [phone] (Fax):           [phone]  I, Baldwin [place], am acting as a scribe for Dr. Irene Limbo  .I have reviewed the above documentation for accuracy and completeness, and I agree with the above. Brunetta Genera MD

## 2018-06-18 ENCOUNTER — Telehealth: Payer: Self-pay | Admitting: Hematology

## 2018-06-18 ENCOUNTER — Inpatient Hospital Stay: Payer: Medicare Other | Attending: Hematology

## 2018-06-18 ENCOUNTER — Encounter: Payer: Self-pay | Admitting: Hematology

## 2018-06-18 ENCOUNTER — Other Ambulatory Visit: Payer: Self-pay | Admitting: Radiology

## 2018-06-18 ENCOUNTER — Inpatient Hospital Stay (HOSPITAL_BASED_OUTPATIENT_CLINIC_OR_DEPARTMENT_OTHER): Payer: Medicare Other | Admitting: Hematology

## 2018-06-18 VITALS — BP 140/53 | HR 73 | Temp 98.2°F | Resp 18 | Ht 60.0 in | Wt 86.3 lb

## 2018-06-18 DIAGNOSIS — Z87442 Personal history of urinary calculi: Secondary | ICD-10-CM | POA: Insufficient documentation

## 2018-06-18 DIAGNOSIS — Z79899 Other long term (current) drug therapy: Secondary | ICD-10-CM | POA: Insufficient documentation

## 2018-06-18 DIAGNOSIS — E538 Deficiency of other specified B group vitamins: Secondary | ICD-10-CM

## 2018-06-18 DIAGNOSIS — Z87891 Personal history of nicotine dependence: Secondary | ICD-10-CM

## 2018-06-18 DIAGNOSIS — J449 Chronic obstructive pulmonary disease, unspecified: Secondary | ICD-10-CM | POA: Diagnosis not present

## 2018-06-18 DIAGNOSIS — M48061 Spinal stenosis, lumbar region without neurogenic claudication: Secondary | ICD-10-CM | POA: Diagnosis not present

## 2018-06-18 DIAGNOSIS — D591 Autoimmune hemolytic anemia, unspecified: Secondary | ICD-10-CM

## 2018-06-18 DIAGNOSIS — J90 Pleural effusion, not elsewhere classified: Secondary | ICD-10-CM | POA: Insufficient documentation

## 2018-06-18 DIAGNOSIS — R0902 Hypoxemia: Secondary | ICD-10-CM

## 2018-06-18 DIAGNOSIS — S2220XA Unspecified fracture of sternum, initial encounter for closed fracture: Secondary | ICD-10-CM | POA: Insufficient documentation

## 2018-06-18 DIAGNOSIS — E876 Hypokalemia: Secondary | ICD-10-CM | POA: Diagnosis not present

## 2018-06-18 DIAGNOSIS — D5912 Cold autoimmune hemolytic anemia: Secondary | ICD-10-CM

## 2018-06-18 DIAGNOSIS — I6523 Occlusion and stenosis of bilateral carotid arteries: Secondary | ICD-10-CM

## 2018-06-18 DIAGNOSIS — M549 Dorsalgia, unspecified: Secondary | ICD-10-CM

## 2018-06-18 DIAGNOSIS — M4854XA Collapsed vertebra, not elsewhere classified, thoracic region, initial encounter for fracture: Secondary | ICD-10-CM

## 2018-06-18 LAB — CBC WITH DIFFERENTIAL/PLATELET
BASOS PCT: 1 %
Basophils Absolute: 0.1 10*3/uL (ref 0.0–0.1)
EOS ABS: 0.1 10*3/uL (ref 0.0–0.5)
Eosinophils Relative: 1 %
HCT: 24.7 % — ABNORMAL LOW (ref 34.8–46.6)
Hemoglobin: 7.9 g/dL — ABNORMAL LOW (ref 11.6–15.9)
Lymphocytes Relative: 14 %
Lymphs Abs: 1.7 10*3/uL (ref 0.9–3.3)
MCH: 32.8 pg (ref 25.1–34.0)
MCHC: 32 g/dL (ref 31.5–36.0)
MCV: 102.5 fL — ABNORMAL HIGH (ref 79.5–101.0)
Monocytes Absolute: 0.7 10*3/uL (ref 0.1–0.9)
Monocytes Relative: 6 %
NEUTROS PCT: 78 %
Neutro Abs: 9.5 10*3/uL — ABNORMAL HIGH (ref 1.5–6.5)
Platelets: 608 10*3/uL — ABNORMAL HIGH (ref 145–400)
RBC: 2.41 MIL/uL — AB (ref 3.70–5.45)
RDW: 18.3 % — ABNORMAL HIGH (ref 11.2–14.5)
WBC: 12 10*3/uL — AB (ref 3.9–10.3)

## 2018-06-18 LAB — CMP (CANCER CENTER ONLY)
ALT: 10 U/L (ref 0–44)
AST: 15 U/L (ref 15–41)
Albumin: 3.9 g/dL (ref 3.5–5.0)
Alkaline Phosphatase: 167 U/L — ABNORMAL HIGH (ref 38–126)
Anion gap: 9 (ref 5–15)
BILIRUBIN TOTAL: 2.6 mg/dL — AB (ref 0.3–1.2)
BUN: 8 mg/dL (ref 8–23)
CO2: 31 mmol/L (ref 22–32)
Calcium: 8.7 mg/dL — ABNORMAL LOW (ref 8.9–10.3)
Chloride: 103 mmol/L (ref 98–111)
Creatinine: 0.63 mg/dL (ref 0.44–1.00)
Glucose, Bld: 136 mg/dL — ABNORMAL HIGH (ref 70–99)
Potassium: 3.1 mmol/L — ABNORMAL LOW (ref 3.5–5.1)
Sodium: 143 mmol/L (ref 135–145)
TOTAL PROTEIN: 6.4 g/dL — AB (ref 6.5–8.1)

## 2018-06-18 LAB — VITAMIN B12: VITAMIN B 12: 732 pg/mL (ref 180–914)

## 2018-06-18 LAB — RETICULOCYTES
RBC.: 2.41 MIL/uL — AB (ref 3.70–5.45)
RETIC CT PCT: 4.7 % — AB (ref 0.7–2.1)
Retic Count, Absolute: 113.3 10*3/uL — ABNORMAL HIGH (ref 33.7–90.7)

## 2018-06-18 LAB — LACTATE DEHYDROGENASE: LDH: 404 U/L — ABNORMAL HIGH (ref 98–192)

## 2018-06-18 NOTE — Telephone Encounter (Signed)
Scheduled appt per 9/26 los - gave patient AVS and calender per los.

## 2018-06-19 ENCOUNTER — Other Ambulatory Visit: Payer: Self-pay | Admitting: Student

## 2018-06-22 ENCOUNTER — Ambulatory Visit (HOSPITAL_COMMUNITY)
Admission: RE | Admit: 2018-06-22 | Discharge: 2018-06-22 | Disposition: A | Discharge status: Home / Self Care | Payer: Medicare Other | Source: Ambulatory Visit | Attending: Family Medicine | Admitting: Family Medicine

## 2018-06-22 ENCOUNTER — Encounter (HOSPITAL_COMMUNITY): Payer: Self-pay

## 2018-06-22 DIAGNOSIS — Z87442 Personal history of urinary calculi: Secondary | ICD-10-CM | POA: Insufficient documentation

## 2018-06-22 DIAGNOSIS — I6523 Occlusion and stenosis of bilateral carotid arteries: Secondary | ICD-10-CM | POA: Insufficient documentation

## 2018-06-22 DIAGNOSIS — Z9889 Other specified postprocedural states: Secondary | ICD-10-CM | POA: Diagnosis not present

## 2018-06-22 DIAGNOSIS — Z79899 Other long term (current) drug therapy: Secondary | ICD-10-CM | POA: Insufficient documentation

## 2018-06-22 DIAGNOSIS — Z87891 Personal history of nicotine dependence: Secondary | ICD-10-CM | POA: Insufficient documentation

## 2018-06-22 DIAGNOSIS — Z960 Presence of urogenital implants: Secondary | ICD-10-CM | POA: Diagnosis not present

## 2018-06-22 DIAGNOSIS — Z91013 Allergy to seafood: Secondary | ICD-10-CM | POA: Insufficient documentation

## 2018-06-22 DIAGNOSIS — Z9841 Cataract extraction status, right eye: Secondary | ICD-10-CM | POA: Insufficient documentation

## 2018-06-22 DIAGNOSIS — S22080A Wedge compression fracture of T11-T12 vertebra, initial encounter for closed fracture: Secondary | ICD-10-CM | POA: Diagnosis present

## 2018-06-22 DIAGNOSIS — X58XXXA Exposure to other specified factors, initial encounter: Secondary | ICD-10-CM | POA: Diagnosis not present

## 2018-06-22 DIAGNOSIS — J449 Chronic obstructive pulmonary disease, unspecified: Secondary | ICD-10-CM | POA: Insufficient documentation

## 2018-06-22 DIAGNOSIS — Z90710 Acquired absence of both cervix and uterus: Secondary | ICD-10-CM | POA: Insufficient documentation

## 2018-06-22 DIAGNOSIS — Z9109 Other allergy status, other than to drugs and biological substances: Secondary | ICD-10-CM | POA: Insufficient documentation

## 2018-06-22 HISTORY — PX: IR KYPHO THORACIC WITH BONE BIOPSY: IMG5518

## 2018-06-22 LAB — CBC
HCT: 22.9 % — ABNORMAL LOW (ref 36.0–46.0)
HEMOGLOBIN: 7.3 g/dL — AB (ref 12.0–15.0)
MCH: 33 pg (ref 26.0–34.0)
MCHC: 31.9 g/dL (ref 30.0–36.0)
MCV: 103.6 fL — AB (ref 78.0–100.0)
PLATELETS: 503 10*3/uL — AB (ref 150–400)
RBC: 2.21 MIL/uL — ABNORMAL LOW (ref 3.87–5.11)
RDW: 17.8 % — ABNORMAL HIGH (ref 11.5–15.5)
WBC: 6.8 10*3/uL (ref 4.0–10.5)

## 2018-06-22 LAB — PROTIME-INR
INR: 1.02
Prothrombin Time: 13.4 seconds (ref 11.4–15.2)

## 2018-06-22 LAB — APTT: aPTT: 27 seconds (ref 24–36)

## 2018-06-22 MED ORDER — FENTANYL CITRATE (PF) 100 MCG/2ML IJ SOLN
INTRAMUSCULAR | Status: AC
  Filled 2018-06-22: qty 2

## 2018-06-22 MED ORDER — CEFAZOLIN SODIUM-DEXTROSE 2-4 GM/100ML-% IV SOLN
INTRAVENOUS | Status: AC
  Administered 2018-06-22: 2 g via INTRAVENOUS
  Filled 2018-06-22: qty 100

## 2018-06-22 MED ORDER — MIDAZOLAM HCL 2 MG/2ML IJ SOLN
INTRAMUSCULAR | Status: AC
  Filled 2018-06-22: qty 4

## 2018-06-22 MED ORDER — IOPAMIDOL (ISOVUE-300) INJECTION 61%
INTRAVENOUS | Status: AC
  Filled 2018-06-22: qty 50

## 2018-06-22 MED ORDER — CEFAZOLIN SODIUM-DEXTROSE 2-4 GM/100ML-% IV SOLN
2.0000 g | Freq: Once | INTRAVENOUS | Status: AC
  Administered 2018-06-22: 2 g via INTRAVENOUS

## 2018-06-22 MED ORDER — LIDOCAINE HCL (PF) 1 % IJ SOLN
INTRAMUSCULAR | Status: AC | PRN
  Administered 2018-06-22: 20 mL

## 2018-06-22 MED ORDER — LIDOCAINE HCL (PF) 1 % IJ SOLN
INTRAMUSCULAR | Status: AC
  Filled 2018-06-22: qty 30

## 2018-06-22 MED ORDER — MIDAZOLAM HCL 2 MG/2ML IJ SOLN
INTRAMUSCULAR | Status: AC | PRN
  Administered 2018-06-22 (×2): 1 mg via INTRAVENOUS

## 2018-06-22 MED ORDER — FENTANYL CITRATE (PF) 100 MCG/2ML IJ SOLN
INTRAMUSCULAR | Status: AC | PRN
  Administered 2018-06-22 (×2): 25 ug via INTRAVENOUS
  Administered 2018-06-22: 50 ug via INTRAVENOUS
  Administered 2018-06-22: 25 ug via INTRAVENOUS

## 2018-06-22 MED ORDER — SODIUM CHLORIDE 0.9 % IV SOLN: INTRAVENOUS | Status: DC

## 2018-06-22 NOTE — Procedures (Signed)
Interventional Radiology Procedure Note  Procedure: T12 vertebral augmentation, kyphoplasty, bipedicular, no bx performed.   Complications: None Recommendations:  - 3 hours supine recovery - Do not submerge for 7 days - Routine care - advance diet - follow up with Dr. Earleen Newport at [facility] clinic in ~4-8 weeks   Signed,  Dulcy Fanny. Earleen Newport, DO

## 2018-06-22 NOTE — Sedation Documentation (Signed)
Patient is resting comfortably. 

## 2018-06-22 NOTE — Discharge Instructions (Signed)
Balloon Kyphoplasty, Care After Refer to this sheet in the next few weeks. These instructions provide you with information about caring for yourself after your procedure. Your health care provider may also give you more specific instructions. Your treatment has been planned according to current medical practices, but problems sometimes occur. Call your health care provider if you have any problems or questions after your procedure. What can I expect after the procedure? After your procedure, it is common to have back pain. Follow these instructions at home: Incision care  Follow instructions from your health care provider about how to take care of your incisions. Make sure you: ? Wash your hands with soap and water before you remove your bandage (dressing). If soap and water are not available, use hand sanitizer.  Check your incision area every day for signs of infection. Watch for: ? Redness, swelling, or pain. ? Fluid, blood, or pus.  Keep your dressing dry until your health care provider says that it can be removed.  Remove dressing in 24-48 hours. You may shower once dressing is removed. Activity   Rest your back and avoid intense physical activity for as long as told by your health care provider.  Return to your normal activities as told by your health care provider. Ask your health care provider what activities are safe for you.  Do not lift anything that is heavier than 10 lb (4.5 kg). This is about the weight of a gallon of milk.You may need to avoid heavy lifting for several weeks. General instructions  Take over-the-counter and prescription medicines only as told by your health care provider.  If directed, apply ice to the painful area: ? Put ice in a plastic bag. ? Place a towel between your skin and the bag. ? Leave the ice on for 20 minutes, 2-3 times per day.  Do not use tobacco products, including cigarettes, chewing tobacco, or e-cigarettes. If you need help  quitting, ask your health care provider.  Keep all follow-up visits as told by your health care provider. This is important. Contact a health care provider if:  You have a fever.  You have redness, swelling, or pain at the site of your incisions.  You have fluid, blood, or pus coming from your incisions.  You have pain that gets worse or does not get better with medicine.  You develop numbness or weakness in any part of your body. Get help right away if:  You have chest pain.  You have difficulty breathing.  You cannot move your legs.  You cannot control your bladder or bowel movements.  You suddenly become weak or numb on one side of your body.  You become very confused.  You have trouble speaking or understanding, or both. This information is not intended to replace advice given to you by your health care provider. Make sure you discuss any questions you have with your health care provider. Document Released: 05/31/2015 Document Revised: 02/15/2016 Document Reviewed: 01/02/2015 Elsevier Interactive Patient Education  2018 [place].  Moderate Conscious Sedation, Adult, Care After These instructions provide you with information about caring for yourself after your procedure. Your health care provider may also give you more specific instructions. Your treatment has been planned according to current medical practices, but problems sometimes occur. Call your health care provider if you have any problems or questions after your procedure. What can I expect after the procedure? After your procedure, it is common:  To feel sleepy for several hours.  To  feel clumsy and have poor balance for several hours.  To have poor judgment for several hours.  To vomit if you eat too soon.  Follow these instructions at home: For at least 24 hours after the procedure:   Do not: ? Participate in activities where you could fall or become injured. ? Drive. ? Use heavy  machinery. ? Drink alcohol. ? Take sleeping pills or medicines that cause drowsiness. ? Make important decisions or sign legal documents. ? Take care of children on your own.  Rest. Eating and drinking  Follow the diet recommended by your health care provider.  If you vomit: ? Drink water, juice, or soup when you can drink without vomiting. ? Make sure you have little or no nausea before eating solid foods. General instructions  Have a responsible adult stay with you until you are awake and alert.  Take over-the-counter and prescription medicines only as told by your health care provider.  If you smoke, do not smoke without supervision.  Keep all follow-up visits as told by your health care provider. This is important. Contact a health care provider if:  You keep feeling nauseous or you keep vomiting.  You feel light-headed.  You develop a rash.  You have a fever. Get help right away if:  You have trouble breathing. This information is not intended to replace advice given to you by your health care provider. Make sure you discuss any questions you have with your health care provider. Document Released: 06/30/2013 Document Revised: 02/12/2016 Document Reviewed: 12/30/2015 [employer] Interactive Patient Education  [employer].

## 2018-06-22 NOTE — H&P (Signed)
Chief Complaint: Patient was seen in consultation today for Thoracic 12 Kyphoplasty at the request of [place]  Referring Physician(s): Emokpae,Courage  Supervising Physician: Corrie Mckusick  Patient Status: [facility] - Out-pt  History of Present Illness: [name] is a 77 y.o. 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 Mild retropulsion of bone into the canal without significant stenosis Chronic fracture T11 Subacute fractures of L3, L4, and L5 unchanged from recent  CT. Diffuse bone marrow abnormality. Question anemia or myeloproliferative disorder. Correlate with CBC.  Was consulted in [facility] by IR for requested T12 KP Was deemed appropriate candidate Now scheduled as OP  Pt fell almost 3 yrs ago Back pain comes and goes Back pain has worsened over last few months Uses walker to support gait MRI shows acute fx T12 Scheduled for Kyphoplasty of same   Past Medical History:  Diagnosis Date  . B12 deficiency   . Bilateral carotid artery stenosis    right CCA and ICA 1-39%/    left ICA 40-59%  per last duplex  10-19-2015  . COPD (chronic obstructive pulmonary disease) ([place])   . Diverticulosis of colon   . History of diverticulitis of colon    2004  . 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 Renal and ureteric calculus    left    Past Surgical History:  Procedure Laterality Date  . ABDOMINAL HYSTERECTOMY  1986  . APPENDECTOMY  1967  . BREAST EXCISIONAL BIOPSY Right 1984  . CATARACT EXTRACTION W/ INTRAOCULAR LENS IMPLANT Right 1990's   congential cataract  . CYSTOSCOPY W/ URETERAL STENT PLACEMENT  11/12/2015   Procedure: CYSTOSCOPY WITH RETROGRADE PYELOGRAM/URETERAL STENT PLACEMENT;  Surgeon: [name], MD;  Location: [facility];  Service: Urology;;  . Consuela Mimes W/ URETERAL  STENT REMOVAL Left 11/22/2015   Procedure: CYSTOSCOPY WITH STENT REMOVAL;  Surgeon: [name], MD;  [facility];  Service: Urology;  Laterality: Left;  . CYSTOSCOPY/URETEROSCOPY/HOLMIUM LASER Left 11/22/2015   Procedure: CYSTOSCOPY/URETEROSCOPY/HOLMIUM LASER;  Surgeon: [name], MD;  Location: [facility];  Service: Urology;  Laterality: Left;  FLEX URETEROSCOPY  . FEMUR IM NAIL Left 08/04/2015   Procedure: INTRAMEDULLARY (IM) NAIL FEMORAL;  Surgeon: [name], MD;  Location: [facility];  Service: Orthopedics;  Laterality: Left;  . TONSILLECTOMY  age 56    Allergies: Other and Shellfish allergy  Medications: Prior to Admission medications   Medication Sig Start Date End Date Taking? Authorizing Provider  acetaminophen (TYLENOL) 325 MG tablet Take 2 tablets (650 mg total) by mouth every 6 (six) hours as needed for mild pain (or Fever >/= 101). 08/08/15  Yes Delfina Redwood, MD  Cyanocobalamin ([employer] B-12) 1000 MCG TBCR TAKE 1 TABLET DAILY 05/22/17  Yes Brunetta Genera, MD  docusate sodium (COLACE) 100 MG capsule Take 1 capsule (100 mg total) by mouth daily. 08/08/15  Yes Delfina Redwood, MD  folic acid (FOLVITE) 1 MG tablet Take 2 tablets (2 mg total) by mouth daily. 06/15/18  Yes Brunetta Genera, MD  Tetrahydrozoline HCl (VISINE OP) Apply 1 drop to eye daily as needed (allergies/dry eyes).   Yes [provider]  traMADol (ULTRAM) 50 MG tablet Take 1 tablet (50 mg total) by mouth every 8 (eight) hours as needed. 06/02/18  Yes Mikhail, Velta Addison, DO  Multiple Vitamin (MULTIVITAMIN WITH MINERALS) TABS tablet Take 1 tablet by mouth daily. 06/03/18   Mikhail, Velta Addison, DO  senna-docusate (SENOKOT-S) 8.6-50 MG tablet Take 2 tablets by mouth 2 (two) times daily. 06/02/18   Cristal Ford, DO     Family History  Problem Relation Age of Onset  . Breast cancer Cousin     Social History   Socioeconomic History  . Marital status:  Divorced    Spouse name: Not on file  . Number of children: Not on file  . Years of education: Not on file  . Highest education level: Not on file  Occupational History  . Not on file  Social Needs  . Financial resource strain: Not on file  . Food insecurity:    Worry: Not on file    Inability: Not on file  . Transportation needs:    Medical: Not on file    Non-medical: Not on file  Tobacco Use  . Smoking status: Former Smoker    Packs/day: 1.00    Types: Cigarettes    Last attempt to quit: 08/03/2015    Years since quitting: 2.8  . Smokeless tobacco: Never Used  Substance and Sexual Activity  . Alcohol use: Yes    Comment: occ  . Drug use: Never  . Sexual activity: Not on file  Lifestyle  . Physical activity:    Days per week: Not on file    Minutes per session: Not on file  . Stress: Not on file  Relationships  . Social connections:    Talks on phone: Not on file    Gets together: Not on file    Attends religious service: Not on file    Active member of club or organization: Not on file    Attends meetings of clubs or organizations: Not on file    Relationship status: Not on file  Other Topics Concern  . Not on file  Social History Narrative  . Not on file     Review of Systems: A 12 point ROS discussed and pertinent positives are indicated in the HPI above.  All other systems are negative.  Review of Systems  Constitutional: Positive for activity change. Negative for fatigue and fever.  Respiratory: Negative for cough and shortness of breath.   Cardiovascular: Negative for chest pain.  Gastrointestinal: Negative for abdominal pain.  Musculoskeletal: Positive for back pain and gait problem.  Neurological: Negative for weakness.  Psychiatric/Behavioral: Negative for behavioral problems and confusion.    Vital Signs: BP (!) 151/55   Pulse 64   Temp 97.7 F (36.5 C) (Oral)   Resp 16   Ht 5' (1.524 m)   Wt 95 lb (43.1 kg)   SpO2 97%   BMI 18.55 kg/m     Physical Exam  Constitutional: She is oriented to person, place, and time.  Cardiovascular: Normal rate, regular rhythm and normal heart sounds.  Pulmonary/Chest: Effort normal and breath sounds normal.  Abdominal: Soft. Bowel sounds are normal.  Musculoskeletal: Normal range of motion.  Neurological: She is alert and oriented to person, place, and time.  Skin: Skin is warm and dry.  Psychiatric: She has a normal mood and affect. Her behavior is normal. Judgment and thought content normal.  Vitals reviewed.   Imaging: Dg Chest 2 View  Result Date: 05/28/2018 CLINICAL DATA:  Hypoxia. EXAM: CHEST - 2 VIEW COMPARISON:  06/12/2016 and prior radiographs FINDINGS: UPPER limits normal heart size again noted. Mild peribronchial thickening is unchanged. LEFT basilar noted.  No definite pleural effusion or pneumothorax. No acute bony abnormalities are present. IMPRESSION: LEFT basilar opacity which may represent airspace disease or atelectasis. Radiographic follow-up to resolution recommended. Electronically Signed   By: Margarette Canada M.D.   On: 05/28/2018 12:49   Ct Angio Chest Pe W And/or Wo Contrast  Result Date: 05/28/2018 CLINICAL DATA:  "Pt complains of lower back pain and left hip pain. Pt states pain has been worse since receiving and epidural a week ago " EXAM: CT ANGIOGRAPHY CHEST CT ABDOMEN AND PELVIS WITH CONTRAST TECHNIQUE: Multidetector CT imaging of the chest was performed using the standard protocol during bolus administration of intravenous contrast. Multiplanar CT image reconstructions and MIPs were obtained to evaluate the vascular anatomy. Multidetector CT imaging of the abdomen and pelvis was performed using the standard protocol during bolus administration of intravenous contrast. CONTRAST:  120mL ISOVUE-370 IOPAMIDOL (ISOVUE-370) INJECTION 76% COMPARISON:  02/04/2018 FINDINGS: CTA CHEST FINDINGS Cardiovascular: The heart size is UPPER normal. No significant coronary artery  calcifications. No pericardial effusion. There is atherosclerotic calcification of the thoracic aorta not associated with aneurysm. The appearance of the pulmonary arteries is unremarkable. The arteries are well opacified and there is no acute pulmonary embolus. Mediastinum/Nodes: The visualized portion of the thyroid gland has a normal appearance. The esophagus is normal in appearance. No mediastinal, hilar, or axillary adenopathy. Lungs/Pleura: There are small bilateral pleural effusions. There is bibasilar atelectasis. Scattered emphysematous changes are identified primarily in the UPPER lobes. Airways are patent. No focal consolidations. Within the RIGHT UPPER lobe there is a spiculated 6 millimeter mass best seen on image 47 of series 4 a 4 millimeter nodule is identified in the RIGHT UPPER lobe on image 64 of series 4. Musculoskeletal: Remote sternal fracture. Degenerative changes are identified in the thoracic spine. Acute compression fracture of T12. Review of the MIP images confirms the above findings. CT ABDOMEN and PELVIS FINDINGS Hepatobiliary: The liver is homogeneous. There is mild periportal edema. No intra or extrahepatic biliary duct dilatation. Gallbladder contains several calcified stones. No pericholecystic fluid. Pancreas: Atrophic and otherwise normal appearance of the pancreas. Spleen: Normal in size without focal abnormality. Adrenals/Urinary Tract: Adrenal glands are normal in appearance. There is symmetric enhancement and excretion of the kidneys. The bladder and visualized portion of the urethra are normal. Stomach/Bowel: The stomach and bowel loops are normal in appearance. There are numerous colonic diverticula. There is thickening of the sigmoid segment which may be related to chronic diverticulosis. No associated inflammatory changes to indicate presence of acute diverticulitis. Vascular/Lymphatic: There is atherosclerotic calcification of the abdominal aorta not associated with  aneurysm. Although involved by atherosclerosis, there is vascular opacification of the celiac axis, superior mesenteric artery, and inferior mesenteric artery. Normal appearance of the portal venous system and inferior vena cava. No retroperitoneal or mesenteric adenopathy. CT Reproductive: The uterus is surgically absent.  No adnexal mass. Other: No free pelvic fluid. Anterior abdominal wall is unremarkable. Musculoskeletal: Acute superior endplate fracture of J19. Chronic anterior wedge deformities of T11. Subacute compression fractures of L3, L4, and L5. Remote ORIF of the LEFT hip. No joint effusion or evidence for dislocation. Review of the MIP images confirms the above findings. IMPRESSION: 1. Acute fracture of T12. 2. No acute pulmonary embolus. 3. Spiculated pulmonary nodule in the RIGHT UPPER lobe measuring 6 millimeters. Morphology is suspicious and further evaluation is recommended. Recommend noncontrast CT of the chest in 3 months. 4.  Emphysema (ICD10-J43.9). 5. Subacute or chronic fractures of T11, L3, L4,  and L5. Remote sternal fracture. 6. Mild periportal edema of uncertain etiology. 7. Colonic diverticulosis. Thickening of the sigmoid segment of the colon may be related to chronic diverticulosis. There is no evidence for acute inflammatory change. 8. Hysterectomy. These results were called by telephone at the time of interpretation on 05/28/2018 at 3:15 pm to Dr. Rodell Perna , who verbally acknowledged these results. Electronically Signed   By: Nolon Nations M.D.   On: 05/28/2018 15:17   Mr Lumbar Spine Wo Contrast  Result Date: 05/29/2018 CLINICAL DATA:  Multiple fractures.  Assess for kyphoplasty. EXAM: MRI LUMBAR SPINE WITHOUT CONTRAST TECHNIQUE: Multiplanar, multisequence MR imaging of the lumbar spine was performed. No intravenous contrast was administered. COMPARISON:  CT lumbar 05/28/2018, MRI lumbar 01/29/2018 FINDINGS: Segmentation:  Normal Alignment:  Normal Vertebrae: Mild superior  endplate fracture L93 with bone marrow edema. This appears acute based on MRI and CT. Mild retropulsion into the canal without significant stenosis. Chronic fracture T11 Mild fracture of L3 vertebral body appears subacute Moderate subacute fracture of L4 Moderate subacute fracture of L5 Bone marrow is diffusely abnormal with low signal on T1 and T2. No focal lesion. Conus medullaris and cauda equina: Conus extends to the L1-2 level. Conus and cauda equina appear normal. Paraspinal and other soft tissues: Negative for paraspinous mass. Mild paraspinous soft tissue thickening/hematoma related to the T12 fracture. Disc levels: T11-12: Mild retropulsion of T12 into the canal due to fracture without significant stenosis T12-L1: Negative L1-2: Negative L2-3: Mild disc and mild facet degeneration without significant stenosis. L3-4: Disc degeneration with diffuse disc bulging and spurring. Bilateral facet hypertrophy. Mild spinal stenosis and mild subarticular stenosis bilaterally L4-5: Disc degeneration and endplate spurring. Bilateral facet hypertrophy. Mild spinal stenosis L5-S1: Mild disc and mild facet degeneration without stenosis. IMPRESSION: Mild superior endplate fracture X90 appears acute. Mild retropulsion of bone into the canal without significant stenosis Chronic fracture T11 Subacute fractures of L3, L4, and L5 unchanged from recent  CT. Diffuse bone marrow abnormality. Question anemia or myeloproliferative disorder. Correlate with CBC. Electronically Signed   By: Franchot Gallo M.D.   On: 05/29/2018 11:31   Ct Abdomen Pelvis W Contrast  Result Date: 05/28/2018 CLINICAL DATA:  "Pt complains of lower back pain and left hip pain. Pt states pain has been worse since receiving and epidural a week ago " EXAM: CT ANGIOGRAPHY CHEST CT ABDOMEN AND PELVIS WITH CONTRAST TECHNIQUE: Multidetector CT imaging of the chest was performed using the standard protocol during bolus administration of intravenous contrast.  Multiplanar CT image reconstructions and MIPs were obtained to evaluate the vascular anatomy. Multidetector CT imaging of the abdomen and pelvis was performed using the standard protocol during bolus administration of intravenous contrast. CONTRAST:  16mL ISOVUE-370 IOPAMIDOL (ISOVUE-370) INJECTION 76% COMPARISON:  02/04/2018 FINDINGS: CTA CHEST FINDINGS Cardiovascular: The heart size is UPPER normal. No significant coronary artery calcifications. No pericardial effusion. There is atherosclerotic calcification of the thoracic aorta not associated with aneurysm. The appearance of the pulmonary arteries is unremarkable. The arteries are well opacified and there is no acute pulmonary embolus. Mediastinum/Nodes: The visualized portion of the thyroid gland has a normal appearance. The esophagus is normal in appearance. No mediastinal, hilar, or axillary adenopathy. Lungs/Pleura: There are small bilateral pleural effusions. There is bibasilar atelectasis. Scattered emphysematous changes are identified primarily in the UPPER lobes. Airways are patent. No focal consolidations. Within the RIGHT UPPER lobe there is a spiculated 6 millimeter mass best seen on image 47 of series 4 a  4 millimeter nodule is identified in the RIGHT UPPER lobe on image 64 of series 4. Musculoskeletal: Remote sternal fracture. Degenerative changes are identified in the thoracic spine. Acute compression fracture of T12. Review of the MIP images confirms the above findings. CT ABDOMEN and PELVIS FINDINGS Hepatobiliary: The liver is homogeneous. There is mild periportal edema. No intra or extrahepatic biliary duct dilatation. Gallbladder contains several calcified stones. No pericholecystic fluid. Pancreas: Atrophic and otherwise normal appearance of the pancreas. Spleen: Normal in size without focal abnormality. Adrenals/Urinary Tract: Adrenal glands are normal in appearance. There is symmetric enhancement and excretion of the kidneys. The bladder  and visualized portion of the urethra are normal. Stomach/Bowel: The stomach and bowel loops are normal in appearance. There are numerous colonic diverticula. There is thickening of the sigmoid segment which may be related to chronic diverticulosis. No associated inflammatory changes to indicate presence of acute diverticulitis. Vascular/Lymphatic: There is atherosclerotic calcification of the abdominal aorta not associated with aneurysm. Although involved by atherosclerosis, there is vascular opacification of the celiac axis, superior mesenteric artery, and inferior mesenteric artery. Normal appearance of the portal venous system and inferior vena cava. No retroperitoneal or mesenteric adenopathy. CT Reproductive: The uterus is surgically absent.  No adnexal mass. Other: No free pelvic fluid. Anterior abdominal wall is unremarkable. Musculoskeletal: Acute superior endplate fracture of K53. Chronic anterior wedge deformities of T11. Subacute compression fractures of L3, L4, and L5. Remote ORIF of the LEFT hip. No joint effusion or evidence for dislocation. Review of the MIP images confirms the above findings. IMPRESSION: 1. Acute fracture of T12. 2. No acute pulmonary embolus. 3. Spiculated pulmonary nodule in the RIGHT UPPER lobe measuring 6 millimeters. Morphology is suspicious and further evaluation is recommended. Recommend noncontrast CT of the chest in 3 months. 4.  Emphysema (ICD10-J43.9). 5. Subacute or chronic fractures of T11, L3, L4, and L5. Remote sternal fracture. 6. Mild periportal edema of uncertain etiology. 7. Colonic diverticulosis. Thickening of the sigmoid segment of the colon may be related to chronic diverticulosis. There is no evidence for acute inflammatory change. 8. Hysterectomy. These results were called by telephone at the time of interpretation on 05/28/2018 at 3:15 pm to Dr. Rodell Perna , who verbally acknowledged these results. Electronically Signed   By: Nolon Nations M.D.   On:  05/28/2018 15:17   Ct L-spine No Charge  Result Date: 05/28/2018 CLINICAL DATA:  Low back pain EXAM: CT LUMBAR SPINE WITHOUT CONTRAST TECHNIQUE: Multidetector CT imaging of the lumbar spine was performed without intravenous contrast administration. Multiplanar CT image reconstructions were also generated. COMPARISON:  Lumbar MRI 01/29/2018 FINDINGS: Segmentation: Normal Alignment: Normal Vertebrae: Chronic compression fracture T11 unchanged -Mild fracture superior endplate of Z76 appears acute -Inferior endplate compression fracture of L3 is new since the prior study. Sclerotic bone changes is present suggesting healing from recent fracture -Moderately severe compression fracture of L4 has progressed since the prior study with evidence of bone healing -Moderate compression fracture L5 is new since the prior MRI and shows evidence of healing with sclerotic changes. Paraspinal and other soft tissues: Bibasilar atelectasis. Atherosclerotic aorta. No paraspinous soft tissue swelling. Disc levels: T12-L1: Negative L1-2: Negative L2-3: Mild disc bulging and mild facet degeneration without significant stenosis L3-4: Disc degeneration and disc bulging with spurring. Bilateral mild facet degeneration. No significant stenosis L4-5: Disc degeneration with disc bulging and endplate spurring. Mild facet degeneration. No significant spinal stenosis L5-S1: Mild disc degeneration without stenosis. IMPRESSION: Acute compression fracture of T12 Subacute compression  fractures of L3, L4, and L5. Chronic fracture of T11 Multilevel degenerative changes in the lumbar spine without significant stenosis. Atherosclerotic aorta Electronically Signed   By: Franchot Gallo M.D.   On: 05/28/2018 14:43    Labs:  CBC: Recent Labs    05/31/18 0426 06/01/18 0436 06/02/18 0401 06/18/18 1016  WBC 7.3 6.7 6.0 12.0*  HGB 7.4* 9.0* 8.3* 7.9*  HCT 22.8* 28.3* 25.9* 24.7*  PLT 321 320 336 608*    COAGS: Recent Labs    06/02/18 0401    INR 0.99    BMP: Recent Labs    05/31/18 0426 06/01/18 0436 06/02/18 0401 06/18/18 1016  NA 141 142 141 143  K 3.5 3.4* 4.3 3.1*  CL 101 102 102 103  CO2 31 31 31 31   GLUCOSE 96 102* 96 136*  BUN 13 15 12 8   CALCIUM 7.9* 8.0* 8.3* 8.7*  CREATININE 0.43* 0.41* 0.36* 0.63  GFRNONAA >60 >60 >60 >60  GFRAA >60 >60 >60 >60    LIVER FUNCTION TESTS: Recent Labs    03/06/18 0922 04/17/18 0857 05/28/18 1136 06/18/18 1016  BILITOT 2.2* 2.8* 4.4* 2.6*  AST 15 17 21 15   ALT 8 10 10 10   ALKPHOS 126 161* 101 167*  PROT 6.3* 7.0 6.6 6.4*  ALBUMIN 4.1 4.7 3.7 3.9    TUMOR MARKERS: No results for input(s): AFPTM, CEA, CA199, CHROMGRNA in the last 8760 hours.  Assessment and Plan:  Painful Thoracic 12 fracture Scheduled for Kyphoplasty today Risks and benefits of  T 12 KP were discussed with the patient including, but not limited to education regarding the natural healing process of compression fractures without intervention, bleeding, infection, cement migration which may cause spinal cord damage, paralysis, pulmonary embolism or even death.  This interventional procedure involves the use of X-rays and because of the nature of the planned procedure, it is possible that we will have prolonged use of X-ray fluoroscopy.  Potential radiation risks to you include (but are not limited to) the following: - A slightly elevated risk for cancer  several years later in life. This risk is typically less than 0.5% percent. This risk is low in comparison to the normal incidence of human cancer, which is 33% for women and 50% for men according to the [place]. - Radiation induced injury can include skin redness, resembling a rash, tissue breakdown / ulcers and hair loss (which can be temporary or permanent).   The likelihood of either of these occurring depends on the difficulty of the procedure and whether you are sensitive to radiation due to previous procedures, disease, or  genetic conditions.   IF your procedure requires a prolonged use of radiation, you will be notified and given written instructions for further action.  It is your responsibility to monitor the irradiated area for the 2 weeks following the procedure and to notify your physician if you are concerned that you have suffered a radiation induced injury.    All of the patient's questions were answered, patient is agreeable to proceed.  Consent signed and in chart.  Thank you for this interesting consult.  I greatly enjoyed meeting [name] and look forward to participating in their care.  A copy of this report was sent to the requesting provider on this date.  Electronically Signed: Lavonia Drafts, PA-C 06/22/2018, 8:18 AM   I spent a total of  30 Minutes   in face to face in clinical consultation, greater than 50% of which  was counseling/coordinating care for T12 KP

## 2018-06-22 NOTE — Sedation Documentation (Signed)
dsg mid back intact

## 2018-06-29 ENCOUNTER — Other Ambulatory Visit: Payer: Self-pay | Admitting: Hematology

## 2018-06-29 DIAGNOSIS — D591 Autoimmune hemolytic anemia, unspecified: Secondary | ICD-10-CM

## 2018-06-29 DIAGNOSIS — D5912 Cold autoimmune hemolytic anemia: Secondary | ICD-10-CM

## 2018-06-30 ENCOUNTER — Telehealth: Payer: Self-pay | Admitting: *Deleted

## 2018-06-30 NOTE — Telephone Encounter (Signed)
Left message that RX for Vitamin b12 refilled.

## 2018-07-17 ENCOUNTER — Encounter: Payer: Self-pay | Admitting: Hematology

## 2018-07-17 ENCOUNTER — Inpatient Hospital Stay: Payer: Medicare Other | Attending: Hematology | Admitting: Hematology

## 2018-07-17 ENCOUNTER — Telehealth: Payer: Self-pay

## 2018-07-17 ENCOUNTER — Inpatient Hospital Stay: Payer: Medicare Other

## 2018-07-17 VITALS — BP 129/50 | HR 71 | Temp 97.9°F | Resp 18 | Ht 60.0 in | Wt 89.1 lb

## 2018-07-17 DIAGNOSIS — Z87891 Personal history of nicotine dependence: Secondary | ICD-10-CM | POA: Insufficient documentation

## 2018-07-17 DIAGNOSIS — Z79899 Other long term (current) drug therapy: Secondary | ICD-10-CM | POA: Insufficient documentation

## 2018-07-17 DIAGNOSIS — D5912 Cold autoimmune hemolytic anemia: Secondary | ICD-10-CM

## 2018-07-17 DIAGNOSIS — I6523 Occlusion and stenosis of bilateral carotid arteries: Secondary | ICD-10-CM

## 2018-07-17 DIAGNOSIS — E538 Deficiency of other specified B group vitamins: Secondary | ICD-10-CM

## 2018-07-17 DIAGNOSIS — D591 Autoimmune hemolytic anemia, unspecified: Secondary | ICD-10-CM

## 2018-07-17 DIAGNOSIS — Z87442 Personal history of urinary calculi: Secondary | ICD-10-CM

## 2018-07-17 DIAGNOSIS — J449 Chronic obstructive pulmonary disease, unspecified: Secondary | ICD-10-CM | POA: Insufficient documentation

## 2018-07-17 DIAGNOSIS — E876 Hypokalemia: Secondary | ICD-10-CM

## 2018-07-17 DIAGNOSIS — M545 Low back pain: Secondary | ICD-10-CM | POA: Insufficient documentation

## 2018-07-17 LAB — CBC WITH DIFFERENTIAL/PLATELET
ABS IMMATURE GRANULOCYTES: 0.03 10*3/uL (ref 0.00–0.07)
BASOS ABS: 0.1 10*3/uL (ref 0.0–0.1)
Basophils Relative: 1 %
EOS ABS: 0.1 10*3/uL (ref 0.0–0.5)
Eosinophils Relative: 1 %
HCT: 23.3 % — ABNORMAL LOW (ref 36.0–46.0)
Hemoglobin: 7.6 g/dL — ABNORMAL LOW (ref 12.0–15.0)
Immature Granulocytes: 0 %
LYMPHS ABS: 2.9 10*3/uL (ref 0.7–4.0)
LYMPHS PCT: 32 %
MCH: 34.5 pg — ABNORMAL HIGH (ref 26.0–34.0)
MCHC: 32.6 g/dL (ref 30.0–36.0)
MCV: 105.9 fL — AB (ref 80.0–100.0)
Monocytes Absolute: 0.5 10*3/uL (ref 0.1–1.0)
Monocytes Relative: 6 %
NEUTROS PCT: 60 %
NRBC: 0 % (ref 0.0–0.2)
Neutro Abs: 5.4 10*3/uL (ref 1.7–7.7)
PLATELETS: 510 10*3/uL — AB (ref 150–400)
RBC: 2.2 MIL/uL — ABNORMAL LOW (ref 3.87–5.11)
RDW: 17 % — AB (ref 11.5–15.5)
WBC: 9.1 10*3/uL (ref 4.0–10.5)

## 2018-07-17 LAB — CMP (CANCER CENTER ONLY)
ALK PHOS: 140 U/L — AB (ref 38–126)
ALT: 11 U/L (ref 0–44)
AST: 19 U/L (ref 15–41)
Albumin: 3.9 g/dL (ref 3.5–5.0)
Anion gap: 10 (ref 5–15)
BILIRUBIN TOTAL: 2.2 mg/dL — AB (ref 0.3–1.2)
BUN: 5 mg/dL — AB (ref 8–23)
CALCIUM: 8.8 mg/dL — AB (ref 8.9–10.3)
CO2: 32 mmol/L (ref 22–32)
Chloride: 101 mmol/L (ref 98–111)
Creatinine: 0.59 mg/dL (ref 0.44–1.00)
GFR, Est AFR Am: >60 mL/min (ref >60)
GFR, Estimated: >60 mL/min (ref >60)
GLUCOSE: 81 mg/dL (ref 70–99)
Potassium: 3.6 mmol/L (ref 3.5–5.1)
SODIUM: 143 mmol/L (ref 135–145)
Total Protein: 6.2 g/dL — ABNORMAL LOW (ref 6.5–8.1)

## 2018-07-17 LAB — SAMPLE TO BLOOD BANK

## 2018-07-17 LAB — LACTATE DEHYDROGENASE: LDH: 385 U/L — ABNORMAL HIGH (ref 98–192)

## 2018-07-17 NOTE — Progress Notes (Signed)
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 The patient's last visit with [place] was on 06/18/18. She is accompanied today by her sister. The pt reports that she is doing well overall.   The pt reports that the vertebroplasty did improve her pain, and is ambulating more easily, however she notes that her back pain does persist. She notes that she has avoided heavy lifting but has bent over to pick up small items. The pt notes that she is taking Vitamin D. She notes that she has been able to take her Tramadol less.   The pt notes that her energy levels have been stable, and she has continued on her folic acid replacement.   Lab results today (07/17/18) of CBC w/diff and CMP is as follows: all values are WNL except for RBC at 2.20, HGB at 7.6, HCT at 23.3, MCV at 105.9, MCH at 34.5, RDW at 17.0, PLT at 510k, BUN at 5, Calcium at 8.8, Total Protein at 6.2, Alk Phos at 140, Total Bilirubin at 2.2. 07/17/18 LDH at 385  On review of systems, pt reports improved but persisting back pain, stable energy levels, and denies new fatigue, light headedness, dizziness, abdominal pains, leg swelling, and any other symptoms.    REVIEW OF SYSTEMS:    A 10+ POINT REVIEW OF SYSTEMS WAS OBTAINED including neurology, dermatology, psychiatry, cardiac, respiratory, lymph, extremities, GI, GU, Musculoskeletal, constitutional, breasts, reproductive, HEENT.  All pertinent positives are noted in the HPI.  All others are negative.   Past Medical History:  Diagnosis Date  . B12 deficiency   . Bilateral carotid artery stenosis    right CCA and ICA 1-39%/    left ICA  40-59%  per last duplex  10-19-2015  . COPD (chronic obstructive pulmonary disease) ([place])   . Diverticulosis of colon   . History of diverticulitis of colon    2004  . 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 Renal and ureteric calculus    left    . Past Surgical History:  Procedure Laterality Date  . ABDOMINAL HYSTERECTOMY  1986  . APPENDECTOMY  1967  . BREAST EXCISIONAL BIOPSY Right 1984  . CATARACT EXTRACTION W/ INTRAOCULAR LENS IMPLANT Right 1990's   congential cataract  . CYSTOSCOPY W/ URETERAL STENT PLACEMENT  11/12/2015   Procedure: CYSTOSCOPY WITH RETROGRADE PYELOGRAM/URETERAL STENT PLACEMENT;  Surgeon: [name], MD;  Location: [facility];  Service: Urology;;  . Consuela Mimes W/ URETERAL STENT REMOVAL Left 11/22/2015   Procedure: CYSTOSCOPY WITH STENT REMOVAL;  Surgeon: [name], MD;  Location[facility]  Service: Urology;  Laterality: Left;  . CYSTOSCOPY/URETEROSCOPY/HOLMIUM LASER Left 11/22/2015   Procedure: CYSTOSCOPY/URETEROSCOPY/HOLMIUM LASER;  Surgeon: [name], MD;  Location: [facility];  Service: Urology;  Laterality: Left;  FLEX URETEROSCOPY  . FEMUR IM NAIL Left 08/04/2015   Procedure: INTRAMEDULLARY (IM) NAIL FEMORAL;  Surgeon: [name], MD;  Location: [place];  Service: Orthopedics;  Laterality: Left;  . IR KYPHO THORACIC WITH BONE BIOPSY  06/22/2018  . TONSILLECTOMY  age 42    . Social History   Tobacco Use  .  Smoking status: Former Smoker    Packs/day: 1.00    Types: Cigarettes    Last attempt to quit: 08/03/2015    Years since quitting: 2.9  . Smokeless tobacco: Never Used  Substance Use Topics  . Alcohol use: Yes    Comment: occ  . Drug use: Never    ALLERGIES:  is allergic to other and shellfish allergy.  MEDICATIONS:  Current Outpatient Medications  Medication Sig Dispense Refill  . acetaminophen (TYLENOL) 325 MG tablet Take 2 tablets (650 mg total) by mouth every  6 (six) hours as needed for mild pain (or Fever >/= 101).    . CVS VITAMIN B12 1000 MCG TBCR TAKE 1 TABLET BY MOUTH EVERY DAY 90 tablet 3  . docusate sodium (COLACE) 100 MG capsule Take 1 capsule (100 mg total) by mouth daily.    . folic acid (FOLVITE) 1 MG tablet Take 2 tablets (2 mg total) by mouth daily. 90 tablet 3  . Multiple Vitamin (MULTIVITAMIN WITH MINERALS) TABS tablet Take 1 tablet by mouth daily.    Marland Kitchen senna-docusate (SENOKOT-S) 8.6-50 MG tablet Take 2 tablets by mouth 2 (two) times daily.    . Tetrahydrozoline HCl (VISINE OP) Apply 1 drop to eye daily as needed (allergies/dry eyes).    . traMADol (ULTRAM) 50 MG tablet Take 1 tablet (50 mg total) by mouth every 8 (eight) hours as needed. 20 tablet 0   No current facility-administered medications for this visit.     PHYSICAL EXAMINATION: ECOG PERFORMANCE STATUS: 1 - Symptomatic but completely ambulatory  Vitals:   07/17/18 1133  BP: (!) 129/50  Pulse: 71  Resp: 18  Temp: 97.9 F (36.6 C)  SpO2: 100%    Filed Weights   07/17/18 1133  Weight: 89 lb 1.6 oz (40.4 kg)   .Body mass index is 17.4 kg/m.  GENERAL:alert, in no acute distress and comfortable SKIN: no acute rashes, no significant lesions EYES: conjunctiva are pink and non-injected, sclera anicteric OROPHARYNX: MMM, no exudates, no oropharyngeal erythema or ulceration NECK: supple, no JVD LYMPH:  no palpable lymphadenopathy in the cervical, axillary or inguinal regions LUNGS: clear to auscultation b/l with normal respiratory effort HEART: regular rate & rhythm ABDOMEN:  normoactive bowel sounds , non tender, not distended. No palpable hepatosplenomegaly.  Extremity: no pedal edema PSYCH: alert & oriented x 3 with fluent speech NEURO: no focal motor/sensory deficits   LABORATORY DATA:   I have reviewed the data as listed  . CBC Latest Ref Rng & Units 07/17/2018 06/22/2018 06/18/2018  WBC 4.0 - 10.5 K/uL 9.1 6.8 12.0(H)  Hemoglobin 12.0 - 15.0 g/dL 7.6(L)  7.3(L) 7.9(L)  Hematocrit 36.0 - 46.0 % 23.3(L) 22.9(L) 24.7(L)  Platelets 150 - 400 K/uL 510(H) 503(H) 608(H)    CMP Latest Ref Rng & Units 07/17/2018 06/18/2018 06/02/2018  Glucose 70 - 99 mg/dL 81 136(H) 96  BUN 8 - 23 mg/dL 5(L) 8 12  Creatinine 0.44 - 1.00 mg/dL 0.59 0.63 0.36(L)  Sodium 135 - 145 mmol/L 143 143 141  Potassium 3.5 - 5.1 mmol/L 3.6 3.1(L) 4.3  Chloride 98 - 111 mmol/L 101 103 102  CO2 22 - 32 mmol/L 32 31 31  Calcium 8.9 - 10.3 mg/dL 8.8(L) 8.7(L) 8.3(L)  Total Protein 6.5 - 8.1 g/dL 6.2(L) 6.4(L) -  Total Bilirubin 0.3 - 1.2 mg/dL 2.2(H) 2.6(H) -  Alkaline Phos 38 - 126 U/L 140(H) 167(H) -  AST 15 - 41 U/L 19 15 -  ALT 0 - 44 U/L  11 10 -   . Lab Results  Component Value Date   LDH 385 (H) 07/17/2018          RADIOGRAPHIC STUDIES: I have personally reviewed the radiological images as listed and agreed with the findings in the report. Ir Kypho Thoracic With Bone Biopsy  Result Date: 06/22/2018 INDICATION: 77 year old female with a history of T12 compression fracture EXAM: IR KYPHO VERTEBRAL THORACIC AUGMENTATION COMPARISON:  Previous MR imaging MEDICATIONS: As antibiotic prophylaxis, 2 g Ancef was ordered pre-procedure and administered intravenously within 1 hour of incision. ANESTHESIA/SEDATION: Moderate (conscious) sedation was employed during this procedure. A total of Versed 2.0 mg and Fentanyl 125 mcg was administered intravenously. Moderate Sedation Time: 30 minutes. The patient's level of consciousness and vital signs were monitored continuously by radiology nursing throughout the procedure under my direct supervision. FLUOROSCOPY TIME:  Fluoroscopy Time: 6 minutes 24 seconds (82 mGy) COMPLICATIONS: None PROCEDURE: Following a full explanation of the procedure along with the potentially associated complications, a witnessed informed consent was obtained. Specific risks that were discussed included bleeding, infection, injury to adjacent structures,  neurologic injury, embolization of cement within the veins, failure of the procedure to improve pain, need for further procedure/ surgery, cardiopulmonary collapse, death. The patient understands the risks and wishes to proceed. The patient was placed prone on the fluoroscopic table. Nasal oxygen was administered. Physiologic monitoring was performed throughout the duration of the procedure. The skin overlying the T12 region was prepped and draped in the usual sterile fashion. The T12 vertebral body was identified and the right pedicle was infiltrated with 1% lidocaine. This was then followed by the advancement of an 8-gauge Medtronic needle through the right pedicle into the anterior one-third at vertebral body. The left pedicle was then infiltrated with 1% lidocaine. A small incision was made with 11 blade scalpel, and then a Medtronic 8 gauge needle was advanced through the left pedicle into the anterior 1/3 of the vertebral body. Simultaneous inflation of the left and right pedicular cannula balloons was performed under fluoroscopic observation. Methylmethacrylate mixture was then reconstituted. Under biplane intermittent fluoroscopy, the methylmethacrylate was then injected into the cavity within T12 vertebral body with filling of the fracture cleft. Minimal contamination of the extra osseous structures identified. Minimal venous contamination. The needles were then removed. Hemostasis was achieved at the skin entry site. There were no acute complications. Patient tolerated the procedure well. The patient was observed for 30 minutes and returned to her room in good condition. IMPRESSION: Status post image guided T12 vertebral augmentation with kyphoplasty using bipedicular approach. Signed, Dulcy Fanny. Dellia Nims, RPVI [facility] Electronically Signed   By: Corrie Mckusick D.O.   On: 06/22/2018 10:35    ASSESSMENT & PLAN:   #1: Cold Agglutinin related  chronic hemolytic anemia. Her hemoglobin tends to be lower during winter and has been in the 6-8 range. Overall stable hemoglobin levels in the 6-7 range with folic acid replacement and cold avoidance.  #2 IgM lambda MGUS - cannot rule out possibility of lymphoplasmacytic lymphoma though less likely given chronicity of her condition.  #3 history of previous B12 deficiency  Plan:  -Continue to avoid cold aggressively -Pt needs a transfusion to keep Hgb at least above 6 -ALL TRANSFUSION PRODUCTS SHOULD BE PRE-WARMED USING A BLOOD WARMER INCLUDING IV FLUIDS, IF ANY. -Recommend that the pt reconnect with her PCP regarding her oxygen evaluation and pt will check O2 sats with pulse oximeter  -Continue with Folic  acid and Vitamin B12 replacement  -Encouraged pt to begin supplements like Boost or Ensure and increase her intake  -Discussed pt labwork today, 07/17/18; HGB stable at 7.6, some atypical lymph seen in smear. Blood counts and chemistries are otherwise stable.  -Will order Flow cytometry for WBC morphology seen as "many atypical lymphs, some appear plasmacytoid" to rule out a clonal process  -Begin OTC 2000 units Vitamin D replacement -Recommended that the pt do her best to avoid bending over to pick things up  -Continue Folic acid and Vitamin B12, and staying as warm as reasonably possible  -Finish Potassium replacement and begin eating more potassium rich foods -Will see the pt back in 6-8 weeks, sooner if any new fatigue or concerns   #4 hypokalemia K 3.1 -Will refill Potassium, begin taking BID with orange juice, and pt completed her prednisone course so I expect her potassium levels to increase   #5. Low back pain -compression fracture L4 -MRI L spine -Acute/subacute superior endplate fracture at L4 with loss of height of 10-20%. No retropulsed bone. This is quite likely the cause of the low back pain. This was probably present on the radiographs of 01/20/2018, but not diagnosable  because of the spinal curvature and unlevel endplates. Old healed superior endplate fracture at Q76 Plan -Continue follow up with IR for kypho thoracic  -prn Tramadol for back pain   #6 Patient Active Problem List   Diagnosis Date Noted  . Symptomatic anemia 05/28/2018  . 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 Occult blood in stools 02/04/2018  . Ureteral calculus 11/12/2015  . Breast cancer screening, high risk patient 08/29/2015  . Acute delirium 08/07/2015  . Postoperative anemia due to acute blood loss 08/06/2015  . Protein-calorie malnutrition, severe 08/04/2015  . Closed intertrochanteric fracture of left femur ([place]) 08/03/2015  . Fall 08/03/2015  . Closed right hip fracture ([place]) 08/03/2015  . Hyperbilirubinemia 08/01/2013  . Hypokalemia 07/30/2013  . Vitamin B 12 deficiency 08/07/2012  . 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 Tobacco abuse 06/10/2012   -Continue follow-up with primary care physician for management of other medical comorbidities  -ALL TRANSFUSION PRODUCTS SHOULD BE PRE-WARMED USING A BLOOD WARMER INCLUDING IV FLUIDS, IF ANY.    RTC with Dr Irene Limbo in 6 weeks with labs   The total time spent in the appt was 25 minutes and more than 50% was on counseling and direct patient cares.   Sullivan Lone MD MS AAHIVMS [facility] Hematology/Oncology Physician [facility]  (Office):       [phone] (Work cell):  [phone] (Fax):           [phone]  I, Baldwin Jamaica, am acting as a scribe for Dr. Irene Limbo  .I have reviewed the above documentation for accuracy and completeness, and I agree with the above. Brunetta Genera MD

## 2018-07-17 NOTE — Telephone Encounter (Signed)
Printed avs and calender of upcoming appointment. Per 10/25 los

## 2018-08-27 NOTE — Progress Notes (Signed)
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 The patient's last visit with [place] was on 07/17/18. She is accompanied today by her sister. The pt reports that she is doing well overall.   The pt reports that her back pain is improved but not completely resolved. She notes that she does continue to bend over some to pick up small objects, and understand this is not recommended. She notes that she has been eating better and endorses stable energy levels. The pt presents today in gloves and has continued to avoid cold aggressively.  The pt notes that a couple days ago she had some abdominal pains, which resolved on its own. She has continued moving her bowels well and regularly.   Lab results today (08/28/18) of CBC w/diff and CMP is as follows: all values are WNL except for RBC at 2.14, HGB at 7.5, HCT at 22.4, MCV at 104.7, MCH at 35.0, RDW at 15.8, PLT at 486k, BUN at 7, Calcium at 8.8, Total Protein at 6.3, Alk Phos at 128, Total Bilirubin at 3.0. 08/28/18 LDH at 383 08/28/18 MMP - M spike down from 0.8 to 0.4 08/28/18 Vitamin D is 9.6  On review of systems, pt reports stable energy levels, eating well, improved back pain, moving her bowels well, resolved recent abdominal pains, and denies fevers, chills, concerns of infections, falls, leg swelling, lower abdominal pains, and any other symptoms.   REVIEW OF SYSTEMS:    A 10+ POINT REVIEW OF SYSTEMS WAS OBTAINED including neurology, dermatology, psychiatry, cardiac, respiratory, lymph, extremities, GI, GU, Musculoskeletal, constitutional, breasts, reproductive, HEENT.  All pertinent positives are  noted in the HPI.  All others are negative.   Past Medical History:  Diagnosis Date  . B12 deficiency   . Bilateral carotid artery stenosis    right CCA and ICA 1-39%/    left ICA 40-59%  per last duplex  10-19-2015  . COPD (chronic obstructive pulmonary disease) ([place])   . Diverticulosis of colon   . History of diverticulitis of colon    2004  . 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 Renal and ureteric calculus    left    . Past Surgical History:  Procedure Laterality Date  . ABDOMINAL HYSTERECTOMY  1986  . APPENDECTOMY  1967  . BREAST EXCISIONAL BIOPSY Right 1984  . CATARACT EXTRACTION W/ INTRAOCULAR LENS IMPLANT Right 1990's   congential cataract  . CYSTOSCOPY W/ URETERAL STENT PLACEMENT  11/12/2015   Procedure: CYSTOSCOPY WITH RETROGRADE PYELOGRAM/URETERAL STENT PLACEMENT;  Surgeon: [name], MD;  Location: [facility];  Service: Urology;;  . Consuela Mimes W/ URETERAL STENT REMOVAL Left 11/22/2015   Procedure: CYSTOSCOPY WITH STENT REMOVAL;  Surgeon: [name], MD;  Location: [facility];  Service: Urology;  Laterality: Left;  . CYSTOSCOPY/URETEROSCOPY/HOLMIUM LASER Left 11/22/2015   Procedure: CYSTOSCOPY/URETEROSCOPY/HOLMIUM LASER;  Surgeon: [name], MD;  Location: [facility];  Service: Urology;  Laterality: Left;  FLEX URETEROSCOPY  . FEMUR IM NAIL Left 08/04/2015   Procedure: INTRAMEDULLARY (IM) NAIL FEMORAL;  Surgeon: [name], MD;  Location: [place];  Service: Orthopedics;  Laterality: Left;  . IR KYPHO THORACIC WITH BONE BIOPSY  06/22/2018  . TONSILLECTOMY  age 77    . Social History   Tobacco Use  . Smoking status: Former Smoker    Packs/day: 1.00    Types: Cigarettes    Last attempt to quit: 08/03/2015    Years since quitting: 3.0  . Smokeless tobacco: Never Used  Substance Use Topics  . Alcohol use: Yes    Comment: occ  . Drug use: Never    ALLERGIES:  is allergic to other and  shellfish allergy.  MEDICATIONS:  Current Outpatient Medications  Medication Sig Dispense Refill  . acetaminophen (TYLENOL) 325 MG tablet Take 2 tablets (650 mg total) by mouth every 6 (six) hours as needed for mild pain (or Fever >/= 101).    . CVS VITAMIN B12 1000 MCG TBCR TAKE 1 TABLET BY MOUTH EVERY DAY 90 tablet 3  . docusate sodium (COLACE) 100 MG capsule Take 1 capsule (100 mg total) by mouth daily.    . folic acid (FOLVITE) 1 MG tablet Take 2 tablets (2 mg total) by mouth daily. 90 tablet 3  . Multiple Vitamin (MULTIVITAMIN WITH MINERALS) TABS tablet Take 1 tablet by mouth daily.    [name] senna-docusate (SENOKOT-S) 8.6-50 MG tablet Take 2 tablets by mouth 2 (two) times daily.    . Tetrahydrozoline HCl (VISINE OP) Apply 1 drop to eye daily as needed (allergies/dry eyes).    . traMADol (ULTRAM) 50 MG tablet Take 1 tablet (50 mg total) by mouth every 8 (eight) hours as needed. 20 tablet 0   No current facility-administered medications for this visit.     PHYSICAL EXAMINATION: ECOG PERFORMANCE STATUS: 1 - Symptomatic but completely ambulatory  Vitals:   08/28/18 1201  BP: (!) 128/50  Pulse: 72  Resp: 18  Temp: 98.3 F (36.8 C)  SpO2: 98%    Filed Weights   08/28/18 1201  Weight: 90 lb 11.2 oz (41.1 kg)   .Body mass index is 17.71 kg/m.  GENERAL:alert, in no acute distress and comfortable SKIN: no acute rashes, no significant lesions EYES: conjunctiva are pink and non-injected, sclera anicteric OROPHARYNX: MMM, no exudates, no oropharyngeal erythema or ulceration NECK: supple, no JVD LYMPH:  no palpable lymphadenopathy in the cervical, axillary or inguinal regions LUNGS: clear to auscultation b/l with normal respiratory effort HEART: regular rate & rhythm ABDOMEN:  normoactive bowel sounds , non tender, not distended. No palpable hepatosplenomegaly.  Extremity: no pedal edema PSYCH: alert & oriented x 3 with fluent speech NEURO: no focal motor/sensory deficits    LABORATORY DATA:   I have reviewed the data as listed  . CBC Latest Ref Rng & Units 08/28/2018 07/17/2018 06/22/2018  WBC 4.0 - 10.5 K/uL 9.8 9.1 6.8  Hemoglobin 12.0 - 15.0 g/dL 7.5(L) 7.6(L) 7.3(L)  Hematocrit 36.0 - 46.0 % 22.4(L) 23.3(L) 22.9(L)  Platelets 150 - 400 K/uL 486(H) 510(H) 503(H)    CMP Latest Ref Rng & Units 08/28/2018 07/17/2018 06/18/2018  Glucose 70 - 99 mg/dL 87 81 136(H)  BUN 8 - 23 mg/dL 7(L) 5(L) 8  Creatinine 0.44 - 1.00 mg/dL 0.63 0.59 0.63  Sodium 135 - 145 mmol/L 144 143 143  Potassium 3.5 - 5.1 mmol/L 3.6 3.6 3.1(L)  Chloride 98 - 111 mmol/L 103 101 103  CO2 22 - 32 mmol/L 31 32 31  Calcium 8.9 - 10.3 mg/dL 8.8(L) 8.8(L) 8.7(L)  Total Protein 6.5 - 8.1 g/dL 6.3(L) 6.2(L) 6.4(L)  Total Bilirubin 0.3 -  1.2 mg/dL 3.0(H) 2.2(H) 2.6(H)  Alkaline Phos 38 - 126 U/L 128(H) 140(H) 167(H)  AST 15 - 41 U/L '21 19 15  ' ALT 0 - 44 U/L '7 11 10   ' . Lab Results  Component Value Date   LDH 383 (H) 08/28/2018          RADIOGRAPHIC STUDIES: I have personally reviewed the radiological images as listed and agreed with the findings in the report. No results found.  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 Her hemoglobin tends to be lower during winter and has been in the 6-8 range. Overall stable hemoglobin levels in the 6-7 range with folic acid replacement and cold avoidance.  #2 IgM lambda MGUS - cannot rule out possibility of lymphoplasmacytic lymphoma though less likely given chronicity of her condition.  #3 history of previous B12 deficiency  Plan:  -Discussed pt labwork today, 08/28/18; LDH stable at 383, HGB stable at 7.5, PLT improved to 486k, Total Bilirubin at 3.0 -08/28/18 MMP M spike down from 0.8 to 0.4  -, Flow cytometry- no clonoal B cell lymphoma - Vitamin D are 9.6 -- Will need to start Vit D replacement -- Erocalciferol 50k units weekly  -Continue 16m Folic acid and [id number] B12  replacement -Continue avoiding cold aggressively  -Pt needs a transfusion to keep Hgb at least above 6 -ALL TRANSFUSION PRODUCTS SHOULD BE PRE-WARMED USING A BLOOD WARMER INCLUDING IV FLUIDS, IF ANY. -Recommend that the pt reconnect with her PCP regarding her oxygen evaluation and pt will check O2 sats with pulse oximeter  -Encouraged pt to begin supplements like Boost or Ensure and increase her intake  -Will see the pt back in 6-8 weeks, sooner if any new concerns  #4 hypokalemia- resolved. -Potassium, begin taking BID with orange juice, and pt completed her prednisone course so I expect her potassium levels to increase  -08/28/18 Potassium at 3.6  #5. Low back pain -compression fracture L4 -MRI L spine -Acute/subacute superior endplate fracture at L4 with loss of height of 10-20%. No retropulsed bone. This is quite likely the cause of the low back pain. This was probably present on the radiographs of 01/20/2018, but not diagnosable because of the spinal curvature and unlevel endplates. Old healed superior endplate fracture at TT01Plan -Continue follow up with IR for kypho thoracic  -prn Tramadol for back pain   #6 Patient Active Problem List   Diagnosis Date Noted  . Symptomatic anemia 05/28/2018  . Autoimmune hemolytic anemia ([place] 02/25/2018  . 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 Occult blood in stools 02/04/2018  . Ureteral calculus 11/12/2015  . Breast cancer screening, high risk patient 08/29/2015  . Acute delirium 08/07/2015  . Postoperative anemia due to acute blood loss 08/06/2015  . Protein-calorie malnutrition, severe 08/04/2015  . Closed intertrochanteric fracture of left femur ([place] 08/03/2015  . Fall 08/03/2015  . Closed right hip fracture ([place] 08/03/2015  . Hyperbilirubinemia 08/01/2013  . Hypokalemia 07/30/2013  . Vitamin B 12 deficiency 08/07/2012  . 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[place] 06/10/2012  . Tobacco abuse 06/10/2012    -Continue follow-up with primary care physician for management of other medical comorbidities  -ALL TRANSFUSION PRODUCTS SHOULD BE PRE-WARMED USING A BLOOD WARMER INCLUDING IV FLUIDS, IF ANY.    RTC with Dr KIrene Limboin 7-8 weeks with labs    The total time spent in the appt was 25 minutes and more than 50% was on counseling  and direct patient cares.   Sullivan Lone MD [place] AAHIVMS [facility] Hematology/Oncology Physician [facility]  (Office):       [phone] (Work cell):  [phone] (Fax):           [phone]  I, Baldwin Jamaica, am acting as a scribe for Dr. Sullivan Lone.   .I have reviewed the above documentation for accuracy and completeness, and I agree with the above. Brunetta Genera MD

## 2018-08-28 ENCOUNTER — Inpatient Hospital Stay: Payer: Medicare Other

## 2018-08-28 ENCOUNTER — Inpatient Hospital Stay: Payer: Medicare Other | Attending: Hematology | Admitting: Hematology

## 2018-08-28 ENCOUNTER — Telehealth: Payer: Self-pay | Admitting: Hematology

## 2018-08-28 VITALS — BP 128/50 | HR 72 | Temp 98.3°F | Resp 18 | Ht 60.0 in | Wt 90.7 lb

## 2018-08-28 DIAGNOSIS — D5912 Cold autoimmune hemolytic anemia: Secondary | ICD-10-CM

## 2018-08-28 DIAGNOSIS — D591 Other autoimmune hemolytic anemias: Secondary | ICD-10-CM | POA: Diagnosis not present

## 2018-08-28 DIAGNOSIS — E538 Deficiency of other specified B group vitamins: Secondary | ICD-10-CM

## 2018-08-28 LAB — CBC WITH DIFFERENTIAL/PLATELET
ABS IMMATURE GRANULOCYTES: 0.06 10*3/uL (ref 0.00–0.07)
BASOS ABS: 0.1 10*3/uL (ref 0.0–0.1)
Basophils Relative: 1 %
Eosinophils Absolute: 0.1 10*3/uL (ref 0.0–0.5)
Eosinophils Relative: 1 %
HCT: 22.4 % — ABNORMAL LOW (ref 36.0–46.0)
Hemoglobin: 7.5 g/dL — ABNORMAL LOW (ref 12.0–15.0)
IMMATURE GRANULOCYTES: 1 %
LYMPHS ABS: 3.4 10*3/uL (ref 0.7–4.0)
LYMPHS PCT: 35 %
MCH: 35 pg — AB (ref 26.0–34.0)
MCHC: 33.5 g/dL (ref 30.0–36.0)
MCV: 104.7 fL — ABNORMAL HIGH (ref 80.0–100.0)
Monocytes Absolute: 0.9 10*3/uL (ref 0.1–1.0)
Monocytes Relative: 9 %
NEUTROS PCT: 53 %
NRBC: 0 % (ref 0.0–0.2)
Neutro Abs: 5.3 10*3/uL (ref 1.7–7.7)
Platelets: 486 10*3/uL — ABNORMAL HIGH (ref 150–400)
RBC: 2.14 MIL/uL — ABNORMAL LOW (ref 3.87–5.11)
RDW: 15.8 % — AB (ref 11.5–15.5)
WBC: 9.8 10*3/uL (ref 4.0–10.5)

## 2018-08-28 LAB — CMP (CANCER CENTER ONLY)
ALT: 7 U/L (ref 0–44)
ANION GAP: 10 (ref 5–15)
AST: 21 U/L (ref 15–41)
Albumin: 4.1 g/dL (ref 3.5–5.0)
Alkaline Phosphatase: 128 U/L — ABNORMAL HIGH (ref 38–126)
BUN: 7 mg/dL — ABNORMAL LOW (ref 8–23)
CHLORIDE: 103 mmol/L (ref 98–111)
CO2: 31 mmol/L (ref 22–32)
Calcium: 8.8 mg/dL — ABNORMAL LOW (ref 8.9–10.3)
Creatinine: 0.63 mg/dL (ref 0.44–1.00)
GFR, Estimated: >60 mL/min (ref >60)
Glucose, Bld: 87 mg/dL (ref 70–99)
Potassium: 3.6 mmol/L (ref 3.5–5.1)
SODIUM: 144 mmol/L (ref 135–145)
Total Bilirubin: 3 mg/dL — ABNORMAL HIGH (ref 0.3–1.2)
Total Protein: 6.3 g/dL — ABNORMAL LOW (ref 6.5–8.1)

## 2018-08-28 LAB — LACTATE DEHYDROGENASE: LDH: 383 U/L — ABNORMAL HIGH (ref 98–192)

## 2018-08-28 NOTE — Telephone Encounter (Signed)
Printed calendar and avs. °

## 2018-08-29 LAB — VITAMIN D 25 HYDROXY (VIT D DEFICIENCY, FRACTURES): Vit D, 25-Hydroxy: 9.6 ng/mL — ABNORMAL LOW (ref 30.0–100.0)

## 2018-09-02 LAB — MULTIPLE MYELOMA PANEL, SERUM
ALBUMIN/GLOB SERPL: 2.3 — AB (ref 0.7–1.7)
Albumin SerPl Elph-Mcnc: 4.2 g/dL (ref 2.9–4.4)
Alpha 1: 0.2 g/dL (ref 0.0–0.4)
Alpha2 Glob SerPl Elph-Mcnc: 0.4 g/dL (ref 0.4–1.0)
B-Globulin SerPl Elph-Mcnc: 0.9 g/dL (ref 0.7–1.3)
GAMMA GLOB SERPL ELPH-MCNC: 0.3 g/dL — AB (ref 0.4–1.8)
Globulin, Total: 1.9 g/dL — ABNORMAL LOW (ref 2.2–3.9)
IGA: 119 mg/dL (ref 64–422)
IGG (IMMUNOGLOBIN G), SERUM: 393 mg/dL — AB (ref 700–1600)
IGM (IMMUNOGLOBULIN M), SRM: 526 mg/dL — AB (ref 26–217)
M PROTEIN SERPL ELPH-MCNC: 0.4 g/dL — AB
TOTAL PROTEIN ELP: 6.1 g/dL (ref 6.0–8.5)

## 2018-09-03 MED ORDER — ERGOCALCIFEROL 1.25 MG (50000 UT) PO CAPS: 50000.0000 [IU] | ORAL_CAPSULE | ORAL | 2 refills | Status: DC

## 2018-09-04 LAB — FLOW CYTOMETRY

## 2018-09-09 ENCOUNTER — Telehealth: Payer: Self-pay | Admitting: *Deleted

## 2018-09-09 NOTE — Telephone Encounter (Signed)
Contacted patient to see how she was felling after beginning Vit D supplement. Left message on home and cell number.

## 2018-10-19 NOTE — Progress Notes (Signed)
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 The patient's last visit with [place] was on 08/28/18. She is accompanied today by her sister. The pt reports that she is doing well overall.   The pt reports that the vertebroplasty procedure helped "a little but not a lot," with her back pain. The pt is taking Tylenol, which helps her pain. The pt notes that her leg and hip have also been hurting more, but notes this could be due to the cold weather.   The pt notes that she has been doing her best to stay warm and has been keeping her house very hot.  Lab results today (10/20/18) of CBC w/diff and CMP is as follows: all values are WNL except for WBC at 10.6k, RBC at 2.36, HGB at 8.2, HCT at 24.4, MCV at 103.4, MCH at 34.7, PLT at 446k, Total Bilirubin at 2.5. 10/20/18 Vitamin B12 is pending  On review of systems, pt reports fair energy levels, back pain, and denies abdominal pains, light headedness, dizziness, fatigue, and any other symptoms.   REVIEW OF SYSTEMS:    A 10+ POINT REVIEW OF SYSTEMS WAS OBTAINED including neurology, dermatology, psychiatry, cardiac, respiratory, lymph, extremities, GI, GU, Musculoskeletal, constitutional, breasts, reproductive, HEENT.  All pertinent positives are noted in the HPI.  All others are negative.   Past Medical History:  Diagnosis Date  . B12 deficiency   . Bilateral carotid artery stenosis    right CCA and ICA 1-39%/    left ICA 40-59%  per last duplex  10-19-2015  . COPD (chronic obstructive pulmonary disease) ([place])   . Diverticulosis of colon   . History of diverticulitis of colon    2004  . 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 Renal and ureteric calculus    left    . Past Surgical History:  Procedure Laterality Date  . ABDOMINAL HYSTERECTOMY  1986  . APPENDECTOMY  1967  . BREAST EXCISIONAL BIOPSY Right 1984  . CATARACT EXTRACTION W/ INTRAOCULAR LENS IMPLANT Right 1990's   congential cataract  . CYSTOSCOPY W/ URETERAL STENT PLACEMENT  11/12/2015   Procedure: CYSTOSCOPY WITH RETROGRADE PYELOGRAM/URETERAL STENT PLACEMENT;  Surgeon: [name], MD;  Location: [facility];  Service: Urology;;  . Consuela Mimes W/ URETERAL STENT REMOVAL Left 11/22/2015   Procedure: CYSTOSCOPY WITH STENT REMOVAL;  Surgeon: [name], MD;  Location: [facility];  Service: Urology;  Laterality: Left;  . CYSTOSCOPY/URETEROSCOPY/HOLMIUM LASER Left 11/22/2015   Procedure: CYSTOSCOPY/URETEROSCOPY/HOLMIUM LASER;  Surgeon: [name], MD;  Location: [facility];  Service: Urology;  Laterality: Left;  FLEX URETEROSCOPY  . FEMUR IM NAIL Left 08/04/2015   Procedure: INTRAMEDULLARY (IM) NAIL FEMORAL;  Surgeon: [name], MD;  Location: [place];  Service: Orthopedics;  Laterality: Left;  . IR KYPHO THORACIC WITH BONE BIOPSY  06/22/2018  . TONSILLECTOMY  age 77    . Social History   Tobacco Use  . Smoking status: Former Smoker    Packs/day: 1.00    Types: Cigarettes    Last attempt to quit: 08/03/2015    Years since quitting: 3.2  .  Smokeless tobacco: Never Used  Substance Use Topics  . Alcohol use: Yes    Comment: occ  . Drug use: Never    ALLERGIES:  is allergic to other and shellfish allergy.  MEDICATIONS:  Current Outpatient Medications  Medication Sig Dispense Refill  . acetaminophen (TYLENOL) 325 MG tablet Take 2 tablets (650 mg total) by mouth every 6 (six) hours as needed for mild pain (or Fever >/= 101).    . CVS VITAMIN B12 1000 MCG TBCR TAKE 1 TABLET BY MOUTH EVERY DAY 90 tablet 3  . docusate sodium (COLACE) 100 MG capsule  Take 1 capsule (100 mg total) by mouth daily.    . ergocalciferol (VITAMIN D2) 1.25 MG (50000 UT) capsule Take 1 capsule (50,000 Units total) by mouth once a week. 12 capsule 2  . folic acid (FOLVITE) 1 MG tablet Take 2 tablets (2 mg total) by mouth daily. 90 tablet 3  . Multiple Vitamin (MULTIVITAMIN WITH MINERALS) TABS tablet Take 1 tablet by mouth daily.    Marland Kitchen senna-docusate (SENOKOT-S) 8.6-50 MG tablet Take 2 tablets by mouth 2 (two) times daily.    . Tetrahydrozoline HCl (VISINE OP) Apply 1 drop to eye daily as needed (allergies/dry eyes).    . traMADol (ULTRAM) 50 MG tablet Take 1 tablet (50 mg total) by mouth every 8 (eight) hours as needed. (Patient not taking: Reported on 08/28/2018) 20 tablet 0   No current facility-administered medications for this visit.     PHYSICAL EXAMINATION: ECOG PERFORMANCE STATUS: 1 - Symptomatic but completely ambulatory  Vitals:   10/20/18 1414  BP: (!) 132/55  Pulse: (!) 59  Resp: 18  Temp: 97.7 F (36.5 C)  SpO2: 100%    Filed Weights   10/20/18 1414  Weight: 90 lb 8 oz (41.1 kg)   .Body mass index is 17.67 kg/m.  GENERAL:alert, in no acute distress and comfortable SKIN: no acute rashes, no significant lesions EYES: conjunctiva are pink and non-injected, sclera anicteric OROPHARYNX: MMM, no exudates, no oropharyngeal erythema or ulceration NECK: supple, no JVD LYMPH:  no palpable lymphadenopathy in the cervical, axillary or inguinal regions LUNGS: clear to auscultation b/l with normal respiratory effort HEART: regular rate & rhythm ABDOMEN:  normoactive bowel sounds , non tender, not distended. No palpable hepatosplenomegaly.  Extremity: no pedal edema PSYCH: alert & oriented x 3 with fluent speech NEURO: no focal motor/sensory deficits   LABORATORY DATA:   I have reviewed the data as listed  . CBC Latest Ref Rng & Units 10/20/2018 08/28/2018 07/17/2018  WBC 4.0 - 10.5 K/uL 10.6(H) 9.8 9.1  Hemoglobin 12.0 - 15.0 g/dL 8.2(L)  7.5(L) 7.6(L)  Hematocrit 36.0 - 46.0 % 24.4(L) 22.4(L) 23.3(L)  Platelets 150 - 400 K/uL 446(H) 486(H) 510(H)    CMP Latest Ref Rng & Units 10/20/2018 08/28/2018 07/17/2018  Glucose 70 - 99 mg/dL 87 87 81  BUN 8 - 23 mg/dL 12 7(L) 5(L)  Creatinine 0.44 - 1.00 mg/dL 0.63 0.63 0.59  Sodium 135 - 145 mmol/L 144 144 143  Potassium 3.5 - 5.1 mmol/L 3.6 3.6 3.6  Chloride 98 - 111 mmol/L 103 103 101  CO2 22 - 32 mmol/L 32 31 32  Calcium 8.9 - 10.3 mg/dL 8.9 8.8(L) 8.8(L)  Total Protein 6.5 - 8.1 g/dL 6.5 6.3(L) 6.2(L)  Total Bilirubin 0.3 - 1.2 mg/dL 2.5(H) 3.0(H) 2.2(H)  Alkaline Phos 38 - 126 U/L 123 128(H) 140(H)  AST 15 - 41 U/L 20 21 19   ALT 0 - 44 U/L  11 7 11    . Lab Results  Component Value Date   LDH 383 (H) 08/28/2018          RADIOGRAPHIC STUDIES: I have personally reviewed the radiological images as listed and agreed with the findings in the report. No results found.  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 Her hemoglobin tends to be lower during winter and has been in the 6-8 range. Overall stable hemoglobin levels in the 6-7 range with folic acid replacement and cold avoidance.  #2 IgM lambda MGUS - cannot rule out possibility of lymphoplasmacytic lymphoma though less likely given chronicity of her condition.  #3 history of previous B12 deficiency  Plan:  -Discussed pt labwork today, 10/20/18; HGB slightly improved to 8.2, PLT improved to 446k. Stable bilirubin. Alkaline Phosphatase has normalized. -Patient's hemoglobin is doing very well for this time of the year, and she is clinically stable to improved -Pt will cal me in the interim if she develops any new fatigue, light headedness or dizziness -08/28/18 MMP M spike down from 0.8 to 0.4  -Flow cytometry- no clonoal B cell lymphoma -Vitamin D are 9.6 -- Will need to start Vit D replacement -- Erocalciferol 50k units weekly  -Continue 1mg  Folic acid and [id number] Vitamin B12  replacement -Continue avoiding cold aggressively  -Pt needs a transfusion to keep Hgb at least above 6 -ALL TRANSFUSION PRODUCTS SHOULD BE PRE-WARMED USING A BLOOD WARMER INCLUDING IV FLUIDS, IF ANY. -Recommend that the pt reconnect with her PCP regarding her oxygen evaluation and pt will check O2 sats with pulse oximeter  -Encouraged pt to begin supplements like Boost or Ensure and increase her intake  -Will see the pt back in 3 months, sooner if any new concerns  #4 hypokalemia- resolved. -Potassium, begin taking BID with orange juice, and pt completed her prednisone course so I expect her potassium levels to increase  -08/28/18 Potassium at 3.6  #5. Low back pain -compression fracture L4 -MRI L spine -Acute/subacute superior endplate fracture at L4 with loss of height of 10-20%. No retropulsed bone. This is quite likely the cause of the low back pain. This was probably present on the radiographs of 01/20/2018, but not diagnosable because of the spinal curvature and unlevel endplates. Old healed superior endplate fracture at D14 Plan -Continue follow up with IR for kypho thoracic  -prn Tramadol for back pain   #6 Patient Active Problem List   Diagnosis Date Noted  . Symptomatic anemia 05/28/2018  . 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 Occult blood in stools 02/04/2018  . Ureteral calculus 11/12/2015  . Breast cancer screening, high risk patient 08/29/2015  . Acute delirium 08/07/2015  . Postoperative anemia due to acute blood loss 08/06/2015  . Protein-calorie malnutrition, severe 08/04/2015  . Closed intertrochanteric fracture of left femur ([place]) 08/03/2015  . Fall 08/03/2015  . Closed right hip fracture ([place]) 08/03/2015  . Hyperbilirubinemia 08/01/2013  . Hypokalemia 07/30/2013  . Vitamin B 12 deficiency 08/07/2012  . 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 Tobacco abuse 06/10/2012    -Continue follow-up with primary care physician for management of other medical comorbidities  -ALL TRANSFUSION PRODUCTS SHOULD BE PRE-WARMED USING A BLOOD WARMER INCLUDING IV FLUIDS, IF ANY.    RTC with Dr Irene Limbo in 3 months with labs   The total time spent in the appt was 20 minutes and more than 50% was  on counseling and direct patient cares.   Sullivan Lone MD [place] AAHIVMS [facility] Hematology/Oncology Physician [facility]  (Office):       [phone] (Work cell):  [phone] (Fax):           [phone]  I, Baldwin Jamaica, am acting as a scribe for Dr. Sullivan Lone.   .I have reviewed the above documentation for accuracy and completeness, and I agree with the above. Brunetta Genera MD

## 2018-10-20 ENCOUNTER — Inpatient Hospital Stay: Payer: Medicare Other | Attending: Hematology

## 2018-10-20 ENCOUNTER — Telehealth: Payer: Self-pay | Admitting: Hematology

## 2018-10-20 ENCOUNTER — Inpatient Hospital Stay (HOSPITAL_BASED_OUTPATIENT_CLINIC_OR_DEPARTMENT_OTHER): Payer: Medicare Other | Admitting: Hematology

## 2018-10-20 VITALS — BP 132/55 | HR 59 | Temp 97.7°F | Resp 18 | Ht 60.0 in | Wt 90.5 lb

## 2018-10-20 DIAGNOSIS — J449 Chronic obstructive pulmonary disease, unspecified: Secondary | ICD-10-CM

## 2018-10-20 DIAGNOSIS — D472 Monoclonal gammopathy: Secondary | ICD-10-CM | POA: Insufficient documentation

## 2018-10-20 DIAGNOSIS — E43 Unspecified severe protein-calorie malnutrition: Secondary | ICD-10-CM | POA: Insufficient documentation

## 2018-10-20 DIAGNOSIS — E538 Deficiency of other specified B group vitamins: Secondary | ICD-10-CM

## 2018-10-20 DIAGNOSIS — M549 Dorsalgia, unspecified: Secondary | ICD-10-CM

## 2018-10-20 DIAGNOSIS — Z79899 Other long term (current) drug therapy: Secondary | ICD-10-CM | POA: Insufficient documentation

## 2018-10-20 DIAGNOSIS — D591 Other autoimmune hemolytic anemias: Secondary | ICD-10-CM | POA: Diagnosis not present

## 2018-10-20 DIAGNOSIS — M545 Low back pain: Secondary | ICD-10-CM | POA: Insufficient documentation

## 2018-10-20 DIAGNOSIS — Z87891 Personal history of nicotine dependence: Secondary | ICD-10-CM | POA: Insufficient documentation

## 2018-10-20 DIAGNOSIS — I2510 Atherosclerotic heart disease of native coronary artery without angina pectoris: Secondary | ICD-10-CM

## 2018-10-20 DIAGNOSIS — D5912 Cold autoimmune hemolytic anemia: Secondary | ICD-10-CM

## 2018-10-20 DIAGNOSIS — N201 Calculus of ureter: Secondary | ICD-10-CM | POA: Diagnosis not present

## 2018-10-20 LAB — CMP (CANCER CENTER ONLY)
ALT: 11 U/L (ref 0–44)
ANION GAP: 9 (ref 5–15)
AST: 20 U/L (ref 15–41)
Albumin: 4.3 g/dL (ref 3.5–5.0)
Alkaline Phosphatase: 123 U/L (ref 38–126)
BUN: 12 mg/dL (ref 8–23)
CHLORIDE: 103 mmol/L (ref 98–111)
CO2: 32 mmol/L (ref 22–32)
Calcium: 8.9 mg/dL (ref 8.9–10.3)
Creatinine: 0.63 mg/dL (ref 0.44–1.00)
GFR, Est AFR Am: >60 mL/min (ref >60)
GFR, Estimated: >60 mL/min (ref >60)
Glucose, Bld: 87 mg/dL (ref 70–99)
POTASSIUM: 3.6 mmol/L (ref 3.5–5.1)
Sodium: 144 mmol/L (ref 135–145)
Total Bilirubin: 2.5 mg/dL — ABNORMAL HIGH (ref 0.3–1.2)
Total Protein: 6.5 g/dL (ref 6.5–8.1)

## 2018-10-20 LAB — VITAMIN B12: VITAMIN B 12: 667 pg/mL (ref 180–914)

## 2018-10-20 LAB — CBC WITH DIFFERENTIAL/PLATELET
Abs Immature Granulocytes: 0.02 10*3/uL (ref 0.00–0.07)
BASOS PCT: 1 %
Basophils Absolute: 0.1 10*3/uL (ref 0.0–0.1)
Eosinophils Absolute: 0.1 10*3/uL (ref 0.0–0.5)
Eosinophils Relative: 1 %
HCT: 24.4 % — ABNORMAL LOW (ref 36.0–46.0)
Hemoglobin: 8.2 g/dL — ABNORMAL LOW (ref 12.0–15.0)
Immature Granulocytes: 0 %
Lymphocytes Relative: 34 %
Lymphs Abs: 3.5 10*3/uL (ref 0.7–4.0)
MCH: 34.7 pg — ABNORMAL HIGH (ref 26.0–34.0)
MCHC: 33.6 g/dL (ref 30.0–36.0)
MCV: 103.4 fL — ABNORMAL HIGH (ref 80.0–100.0)
Monocytes Absolute: 0.7 10*3/uL (ref 0.1–1.0)
Monocytes Relative: 7 %
NRBC: 0 % (ref 0.0–0.2)
Neutro Abs: 6.1 10*3/uL (ref 1.7–7.7)
Neutrophils Relative %: 57 %
Platelets: 446 10*3/uL — ABNORMAL HIGH (ref 150–400)
RBC: 2.36 MIL/uL — ABNORMAL LOW (ref 3.87–5.11)
RDW: 14.6 % (ref 11.5–15.5)
WBC: 10.6 10*3/uL — ABNORMAL HIGH (ref 4.0–10.5)

## 2018-10-20 LAB — SAMPLE TO BLOOD BANK

## 2018-10-20 NOTE — Telephone Encounter (Signed)
Gave avs and calendar ° °

## 2018-10-21 ENCOUNTER — Other Ambulatory Visit: Payer: Self-pay | Admitting: Internal Medicine

## 2018-10-21 DIAGNOSIS — R911 Solitary pulmonary nodule: Secondary | ICD-10-CM

## 2018-10-26 ENCOUNTER — Ambulatory Visit
Admission: RE | Admit: 2018-10-26 | Discharge: 2018-10-26 | Disposition: A | Discharge status: Home / Self Care | Payer: Medicare Other | Source: Ambulatory Visit | Attending: Internal Medicine | Admitting: Internal Medicine

## 2018-10-26 DIAGNOSIS — R911 Solitary pulmonary nodule: Secondary | ICD-10-CM

## 2019-01-18 ENCOUNTER — Telehealth: Payer: Self-pay | Admitting: Hematology

## 2019-01-18 ENCOUNTER — Telehealth: Payer: Self-pay | Admitting: *Deleted

## 2019-01-18 NOTE — Telephone Encounter (Signed)
Called patient regarding upcoming appointment, patient is notified. °

## 2019-01-18 NOTE — Telephone Encounter (Signed)
Following message received in in basket from scheduler: "Mrs.Highley sister would like their upcoming follow up appointment to be a telephone visit instead due to concerns of COVID-19, will that be okay?"  Per Dr. Irene Limbo, patient still needs to come and have lab work completed, but MD appt can be changed to phone.  Attempted to contact patient - mobile number mail box full. LVM on home number with information regarding need for lab appt and asked patient how she wanted to proceed regarding changing appts.  Asked patient to contact office.

## 2019-01-18 NOTE — Progress Notes (Signed)
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  I discussed the limitations, risks, security and privacy concerns of performing an evaluation and management service by telemedicine and the availability of in-person appointments. I also discussed with the patient that there may be a patient responsible charge related to this service. The patient expressed understanding and agreed to proceed.  Other persons participating in the visit and their role in the encounter: her sister.  Patient's location: her home Provider's location: my office at the [facility] is called today for continued evaluation and management for cold agglutinin hemolytic anemia. The patient's last visit with [place] was on 10/20/18. The pt reports that she is doing well overall.  The pt reports that she is "about the same," as she was at our last visit in January. She denies new fatigue, light headedness, or dizziness. The pt notes that she continues taking her Vitamin S06 and Folic acid replacement. She notes that she is doing her best to stay warm and has been able to.  The pt notes that her left hip, upper left leg, and lower back have continued to be painful and she notes that she is "trying to be gentle," with her back. She denies her back pain keeping her from doing what she wants to do. She takes Tylenol as needed to address this pain, and has Tramadol available. She also notes that she needs a new prescription for Ergocalciferol. She denies having had a bone density study in the last 2 years.    She notes that she has occasional abdominal discomfort which she associates with her diverticulosis.  Of note since the patient's last visit, pt has had a CT Chest completed on 10/26/18 with results revealing There is a spiculated solid nodule of the right upper lobe, measuring 5-6 mm and stable from prior examination. Recommend additional follow-up in 12 months to ensure ongoing stability. 2.  Emphysema. 3. Increased height loss deformities of T11 and T12.  Lab results today (01/19/19) of CBC w/diff and CMP is as follows: all values are WNL except for WBC at 11.5k, RBC at 2.34, HGB at 8.8, HCT at 25.3, MCV at 108.1, MCH at 37.6, PLT at 486k, Calcium at 8.8, Total Bilirubin at 2.2.  On review of systems, pt reports stable energy levels, stable lower back pain, occasional abdominal discomfort, and denies new fatigue, light headedness, dizziness, and any other symptoms.   REVIEW OF SYSTEMS:    A 10+ POINT REVIEW OF SYSTEMS WAS OBTAINED including neurology, dermatology, psychiatry, cardiac, respiratory, lymph, extremities, GI, GU, Musculoskeletal, constitutional, breasts, reproductive, HEENT.  All pertinent positives are noted in the HPI.  All others are negative.   Past Medical History:  Diagnosis Date  . B12 deficiency   . Bilateral carotid artery stenosis    right CCA and ICA 1-39%/    left ICA 40-59%  per last duplex  10-19-2015  . COPD (chronic obstructive pulmonary disease) ([place])   . Diverticulosis of colon   . History of diverticulitis of colon    2004  . 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 Renal and ureteric calculus    left    . Past Surgical History:  Procedure Laterality Date  . ABDOMINAL HYSTERECTOMY  1986  . APPENDECTOMY  1967  . BREAST EXCISIONAL BIOPSY Right 1984  . CATARACT EXTRACTION W/ INTRAOCULAR LENS IMPLANT Right 1990's   congential cataract  . CYSTOSCOPY W/ URETERAL STENT PLACEMENT  11/12/2015   Procedure: CYSTOSCOPY  WITH RETROGRADE PYELOGRAM/URETERAL STENT PLACEMENT;  Surgeon: [name], MD;  Location[facility];  Service: Urology;;  . Consuela Mimes W/ URETERAL STENT REMOVAL Left 11/22/2015   Procedure: CYSTOSCOPY WITH STENT REMOVAL;  Surgeon: [name], MD;  [facility];  Service: Urology;  Laterality: Left;  . CYSTOSCOPY/URETEROSCOPY/HOLMIUM LASER Left 11/22/2015   Procedure: CYSTOSCOPY/URETEROSCOPY/HOLMIUM LASER;  Surgeon: [name], MD;  Location: [facility];  Service: Urology;  Laterality: Left;  FLEX URETEROSCOPY  . FEMUR IM NAIL Left 08/04/2015   Procedure: INTRAMEDULLARY (IM) NAIL FEMORAL;  Surgeon: [name], MD;  Location: [place];  Service: Orthopedics;  Laterality: Left;  . IR KYPHO THORACIC WITH BONE BIOPSY  06/22/2018  . TONSILLECTOMY  age 44    . Social History   Tobacco Use  . Smoking status: Former Smoker    Packs/day: 1.00    Types: Cigarettes    Last attempt to quit: 08/03/2015    Years since quitting: 3.4  . Smokeless tobacco: Never Used  Substance Use Topics  . Alcohol use: Yes    Comment: occ  . Drug use: Never    ALLERGIES:  is allergic to other and shellfish allergy.  MEDICATIONS:  Current Outpatient Medications  Medication Sig Dispense Refill  . acetaminophen (TYLENOL) 325 MG tablet Take 2 tablets (650 mg total) by mouth every 6 (six) hours as needed for mild pain (or Fever >/= 101).    . CVS VITAMIN B12 1000 MCG TBCR TAKE 1 TABLET BY MOUTH EVERY DAY 90 tablet 3  . docusate sodium (COLACE) 100 MG capsule Take 1 capsule (100 mg total) by mouth daily.    Derrill Memo ON 01/21/2019] ergocalciferol (VITAMIN D2) 1.25 MG (50000 UT) capsule Take 1 capsule (50,000 Units total) by mouth 2 (two) times a week. 12 capsule 3  . folic acid (FOLVITE) 1 MG tablet Take 2 tablets (2 mg total) by mouth daily. 90 tablet 3  . Multiple Vitamin (MULTIVITAMIN WITH MINERALS) TABS tablet Take 1 tablet by mouth daily.    Marland Kitchen senna-docusate (SENOKOT-S)  8.6-50 MG tablet Take 2 tablets by mouth 2 (two) times daily.    . Tetrahydrozoline HCl (VISINE OP) Apply 1 drop to eye daily as needed (allergies/dry eyes).    . traMADol (ULTRAM) 50 MG tablet Take 1 tablet (50 mg total) by mouth every 8 (eight) hours as needed. (Patient not taking: Reported on 08/28/2018) 20 tablet 0   No current facility-administered medications for this visit.     PHYSICAL EXAMINATION: ECOG PERFORMANCE STATUS: 1 - Symptomatic but completely ambulatory  There were no vitals filed for this visit.  There were no vitals filed for this visit. .There is no height or weight on file to calculate BMI.  Phone Visit  LABORATORY DATA:   I have reviewed the data as listed  . CBC Latest Ref Rng & Units 01/19/2019 10/20/2018 08/28/2018  WBC 4.0 - 10.5 K/uL 11.5(H) 10.6(H) 9.8  Hemoglobin 12.0 - 15.0 g/dL 8.8(L) 8.2(L) 7.5(L)  Hematocrit 36.0 - 46.0 % 25.3(L) 24.4(L)  22.4(L)  Platelets 150 - 400 K/uL 486(H) 446(H) 486(H)    CMP Latest Ref Rng & Units 01/19/2019 10/20/2018 08/28/2018  Glucose 70 - 99 mg/dL 90 87 87  BUN 8 - 23 mg/dL 11 12 7(L)  Creatinine 0.44 - 1.00 mg/dL 0.67 0.63 0.63  Sodium 135 - 145 mmol/L 143 144 144  Potassium 3.5 - 5.1 mmol/L 3.7 3.6 3.6  Chloride 98 - 111 mmol/L 103 103 103  CO2 22 - 32 mmol/L 30 32 31  Calcium 8.9 - 10.3 mg/dL 8.8(L) 8.9 8.8(L)  Total Protein 6.5 - 8.1 g/dL 6.9 6.5 6.3(L)  Total Bilirubin 0.3 - 1.2 mg/dL 2.2(H) 2.5(H) 3.0(H)  Alkaline Phos 38 - 126 U/L 122 123 128(H)  AST 15 - 41 U/L 22 20 21   ALT 0 - 44 U/L 12 11 7    . Lab Results  Component Value Date   LDH 383 (H) 08/28/2018          RADIOGRAPHIC STUDIES: I have personally reviewed the radiological images as listed and agreed with the findings in the report. No results found.  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 Her hemoglobin tends to be lower during winter and has been in the 6-8 range. Overall stable hemoglobin levels  in the 6-7 range with folic acid replacement and cold avoidance.  #2 IgM lambda MGUS - cannot rule out possibility of lymphoplasmacytic lymphoma though less likely given chronicity of her condition. 08/28/18 MMP M spike down from 0.8 to 0.4  Flow cytometry- no clonoal B cell lymphoma   #3 history of previous B12 deficiency  #4 hypokalemia- resolved. -Potassium, begin taking BID with orange juice, and pt completed her prednisone course so I expect her potassium levels to increase  -08/28/18 Potassium at 3.6  #5. Low back pain -compression fracture L4 -MRI L spine -Acute/subacute superior endplate fracture at L4 with loss of height of 10-20%. No retropulsed bone. This is quite likely the cause of the low back pain. This was probably present on the radiographs of 01/20/2018, but not diagnosable because of the spinal curvature and unlevel endplates. Old healed superior endplate fracture at J62 Plan -Continue follow up with IR for kypho thoracic  -prn Tramadol for back pain   #6 Spiculated Lung nodule in right upper lobe  First seen on 05/28/18 CTA Chest Hasn't changed over 6 month interval with repeat CT Chest in February 2020  #7 Vitamin D deficiency December 2019 Vitamin D at 9.6  PLAN:  -Discussed pt labwork today, 01/19/19; HGB improved to 8.8 and Total Bilirubin improved to 2.2 consistent with decreased hemolysis -Discussed the 10/26/18 CT Chest which revealed There is a spiculated solid nodule of the right upper lobe, measuring 5-6 mm and stable from prior examination. Recommend additional follow-up in 12 months to ensure ongoing stability. 2.  Emphysema. 3. Increased height loss deformities of T11 and T12. -Would recommend repeating CT Chest in 6 months from 10/26/18 CT -Would need to obtain PET/CT if spiculated nodule is seen to change -Increase Ergocalciferol to twice a week -Continue 1mg  Folic acid and [id number] Vitamin B12 replacement -Continue avoiding cold aggressively  -Pt needs a  transfusion to keep Hgb at least above 6 -ALL TRANSFUSION PRODUCTS SHOULD BE PRE-WARMED USING A BLOOD WARMER INCLUDING IV FLUIDS, IF ANY. -Recommend that the pt reconnect with her PCP regarding her oxygen evaluation and pt will check O2 sats with pulse oximeter  -Encouraged pt to begin supplements like Boost or Ensure and  increase her intake  -Recommend pursuing a bone density study with PCP Dr. [name] and consideration of bisphosphonates considering her history of compression fracture -Will see the pt back in 4 months, sooner if any new concerns  #8 Patient Active Problem List   Diagnosis Date Noted  . Symptomatic anemia 05/28/2018  . 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 Occult blood in stools 02/04/2018  . Ureteral calculus 11/12/2015  . Breast cancer screening, high risk patient 08/29/2015  . Acute delirium 08/07/2015  . Postoperative anemia due to acute blood loss 08/06/2015  . Protein-calorie malnutrition, severe 08/04/2015  . Closed intertrochanteric fracture of left femur ([place]) 08/03/2015  . Fall 08/03/2015  . Closed right hip fracture ([place]) 08/03/2015  . Hyperbilirubinemia 08/01/2013  . Hypokalemia 07/30/2013  . Vitamin B 12 deficiency 08/07/2012  . 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 Tobacco abuse 06/10/2012   -Continue follow-up with primary care physician for management of other medical comorbidities  -ALL TRANSFUSION PRODUCTS SHOULD BE PRE-WARMED USING A BLOOD WARMER INCLUDING IV FLUIDS, IF ANY.    RTC with Dr Irene Limbo with labs in 4 months   I discussed the assessment and treatment plan with the patient. The patient was provided an opportunity to ask questions and al were answered. The patient agreed with the plan and demonstrated an understanding of the instructions.   The patient was advised to call back or seek an in-person evaluation if the symptoms worsen or if  the condition fails to improve as anticipated.   The total time spent in the appt was 25 minutes and more than 50% was on counseling and direct patient cares.   Sullivan Lone MD [place] AAHIVMS [facility] Hematology/Oncology Physician [facility]  (Office):       [phone] (Work cell):  [phone] (Fax):           [phone]  I, Baldwin Jamaica, am acting as a scribe for Dr. Sullivan Lone.   .I have reviewed the above documentation for accuracy and completeness, and I agree with the above. Brunetta Genera MD

## 2019-01-19 ENCOUNTER — Inpatient Hospital Stay: Payer: Medicare Other | Attending: Hematology | Admitting: Hematology

## 2019-01-19 ENCOUNTER — Telehealth: Payer: Self-pay | Admitting: Hematology

## 2019-01-19 ENCOUNTER — Other Ambulatory Visit: Payer: Self-pay

## 2019-01-19 ENCOUNTER — Inpatient Hospital Stay: Payer: Medicare Other

## 2019-01-19 DIAGNOSIS — E538 Deficiency of other specified B group vitamins: Secondary | ICD-10-CM | POA: Diagnosis not present

## 2019-01-19 DIAGNOSIS — E559 Vitamin D deficiency, unspecified: Secondary | ICD-10-CM | POA: Diagnosis not present

## 2019-01-19 DIAGNOSIS — D5912 Cold autoimmune hemolytic anemia: Secondary | ICD-10-CM

## 2019-01-19 DIAGNOSIS — D591 Other autoimmune hemolytic anemias: Secondary | ICD-10-CM

## 2019-01-19 LAB — CMP (CANCER CENTER ONLY)
ALT: 12 U/L (ref 0–44)
AST: 22 U/L (ref 15–41)
Albumin: 4.5 g/dL (ref 3.5–5.0)
Alkaline Phosphatase: 122 U/L (ref 38–126)
Anion gap: 10 (ref 5–15)
BUN: 11 mg/dL (ref 8–23)
CO2: 30 mmol/L (ref 22–32)
Calcium: 8.8 mg/dL — ABNORMAL LOW (ref 8.9–10.3)
Chloride: 103 mmol/L (ref 98–111)
Creatinine: 0.67 mg/dL (ref 0.44–1.00)
GFR, Est AFR Am: >60 mL/min (ref >60)
GFR, Estimated: >60 mL/min (ref >60)
Glucose, Bld: 90 mg/dL (ref 70–99)
Potassium: 3.7 mmol/L (ref 3.5–5.1)
Sodium: 143 mmol/L (ref 135–145)
Total Bilirubin: 2.2 mg/dL — ABNORMAL HIGH (ref 0.3–1.2)
Total Protein: 6.9 g/dL (ref 6.5–8.1)

## 2019-01-19 LAB — CBC WITH DIFFERENTIAL/PLATELET
Abs Immature Granulocytes: 0.02 10*3/uL (ref 0.00–0.07)
Basophils Absolute: 0.1 10*3/uL (ref 0.0–0.1)
Basophils Relative: 1 %
Eosinophils Absolute: 0.1 10*3/uL (ref 0.0–0.5)
Eosinophils Relative: 1 %
HCT: 25.3 % — ABNORMAL LOW (ref 36.0–46.0)
Hemoglobin: 8.8 g/dL — ABNORMAL LOW (ref 12.0–15.0)
Immature Granulocytes: 0 %
Lymphocytes Relative: 29 %
Lymphs Abs: 3.3 10*3/uL (ref 0.7–4.0)
MCH: 37.6 pg — ABNORMAL HIGH (ref 26.0–34.0)
MCHC: 34.8 g/dL (ref 30.0–36.0)
MCV: 108.1 fL — ABNORMAL HIGH (ref 80.0–100.0)
Monocytes Absolute: 0.7 10*3/uL (ref 0.1–1.0)
Monocytes Relative: 6 %
Neutro Abs: 7.3 10*3/uL (ref 1.7–7.7)
Neutrophils Relative %: 63 %
Platelets: 486 10*3/uL — ABNORMAL HIGH (ref 150–400)
RBC: 2.34 MIL/uL — ABNORMAL LOW (ref 3.87–5.11)
RDW: 14.6 % (ref 11.5–15.5)
WBC: 11.5 10*3/uL — ABNORMAL HIGH (ref 4.0–10.5)
nRBC: 0 % (ref 0.0–0.2)

## 2019-01-19 LAB — SAMPLE TO BLOOD BANK

## 2019-01-19 MED ORDER — ERGOCALCIFEROL 1.25 MG (50000 UT) PO CAPS: 50000.0000 [IU] | ORAL_CAPSULE | ORAL | 3 refills | Status: DC

## 2019-01-19 NOTE — Telephone Encounter (Signed)
Scheduled appt per 4/28 los. ° °A calendar will be mailed out. °

## 2019-05-05 IMAGING — XA IR KYPHO VERTEBRAL THORACIC AUGMENTATION
1 series · 12 of 12 positions shown · non-contrast
Comparison: Previous MR imaging

INDICATION: 76-year-old female with a history of T12 compression fracture

EXAM:
IR KYPHO VERTEBRAL THORACIC AUGMENTATION

[Series 300: dsa body · 12 of 12 slices shown]
[im 1/12]
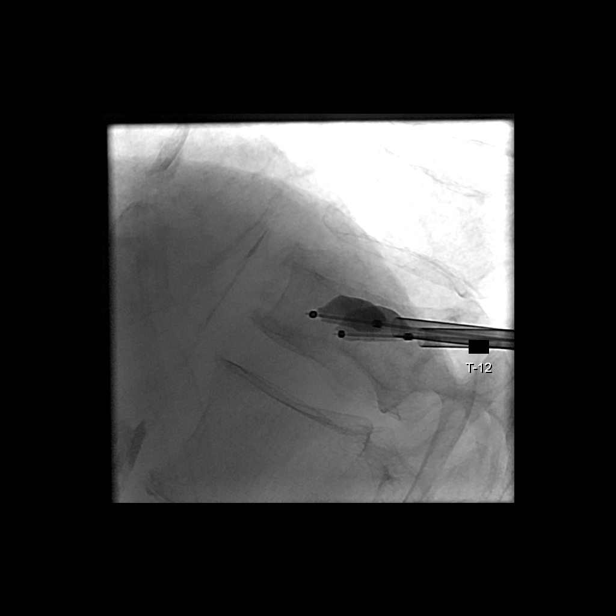
[im 2/12]
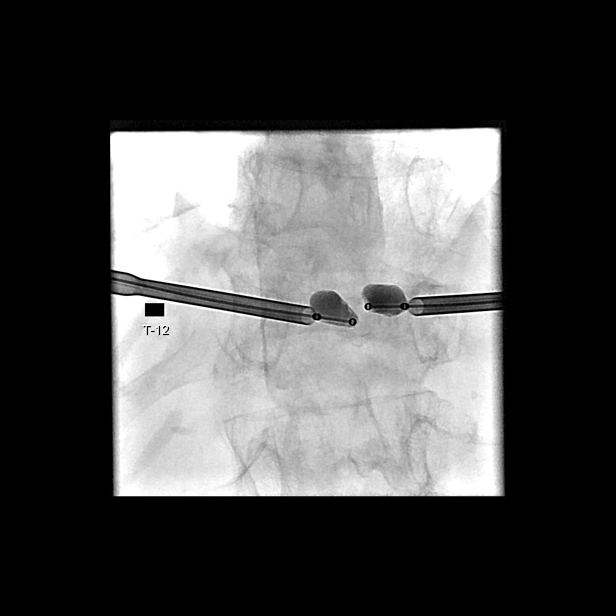
[im 3/12]
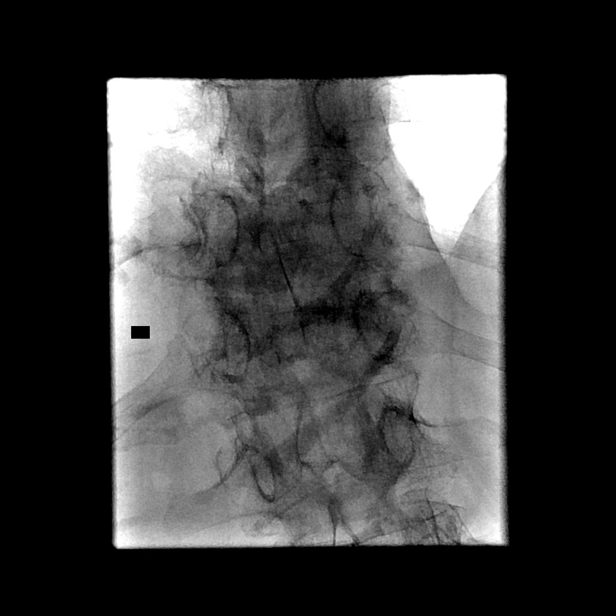
[im 4/12]
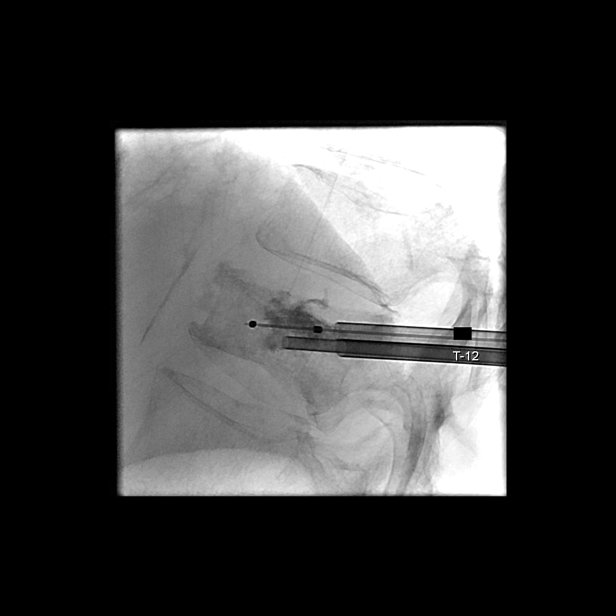
[im 5/12]
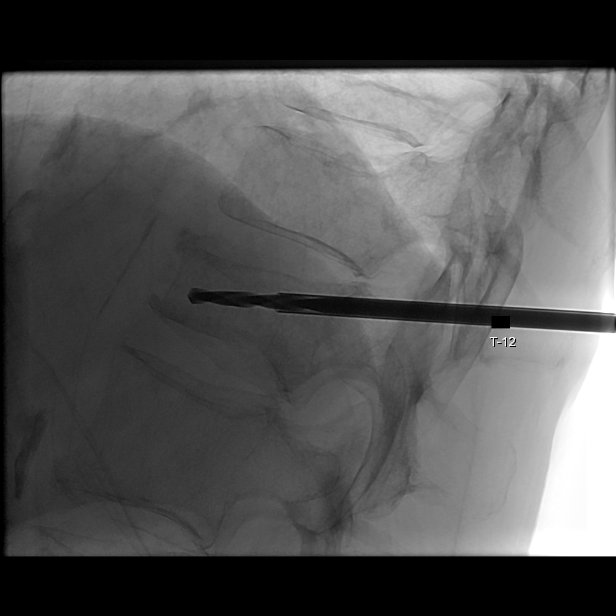
[im 6/12]
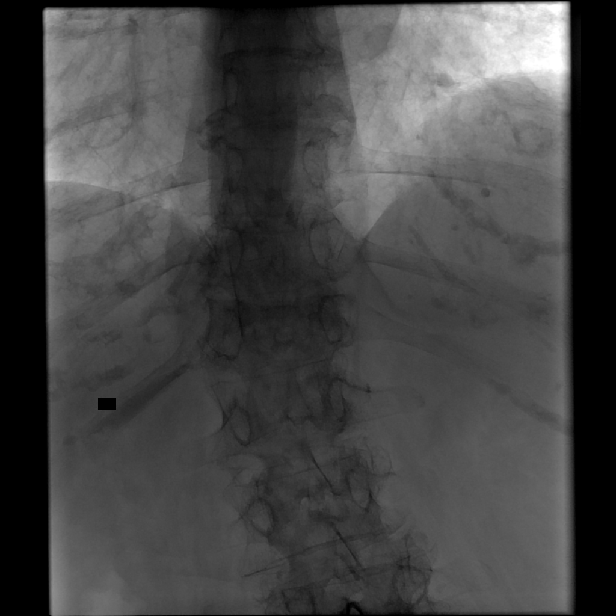
[im 7/12]
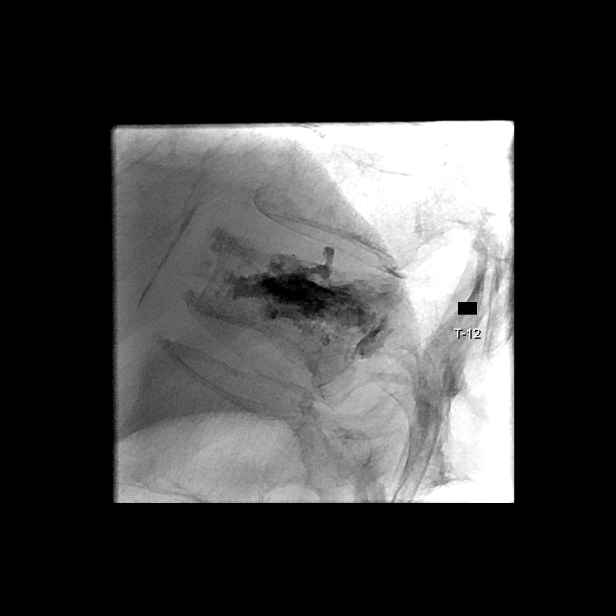
[im 8/12]
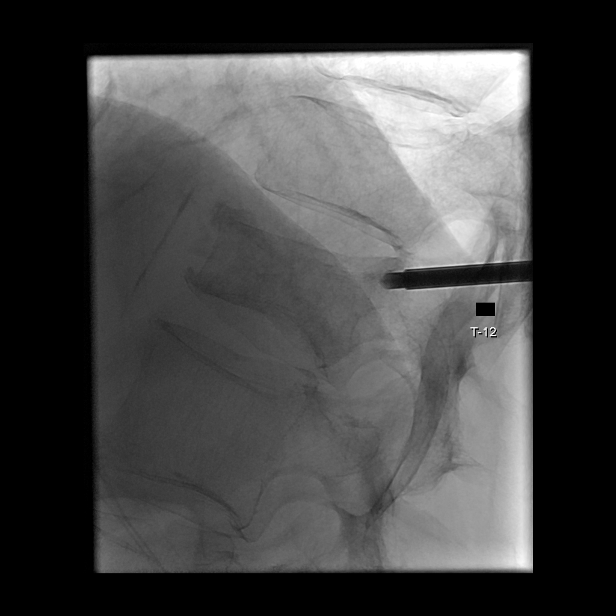
[im 9/12]
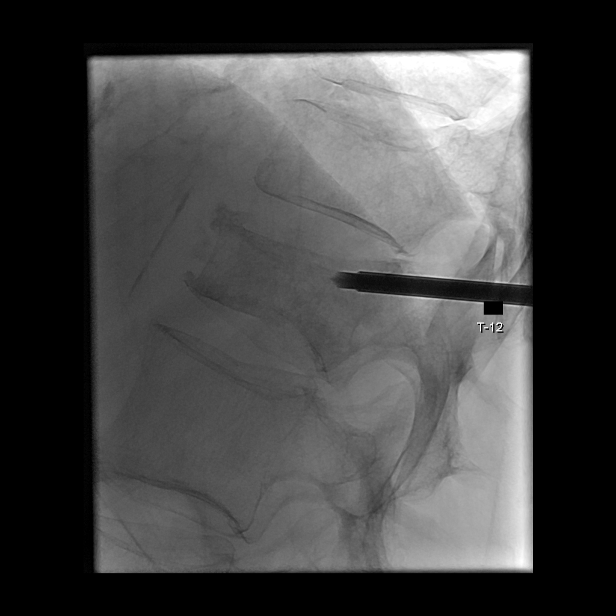
[im 10/12]
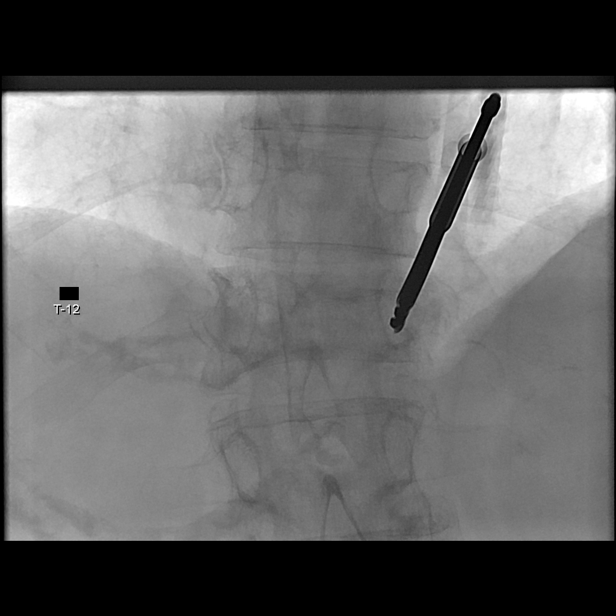
[im 11/12]
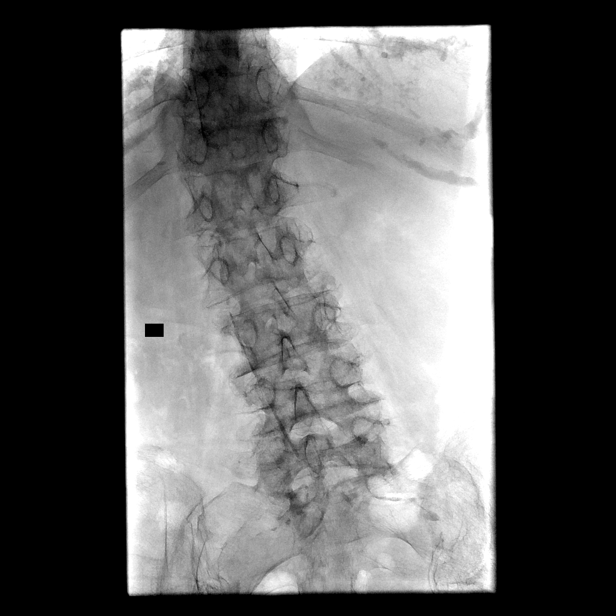
[im 12/12]
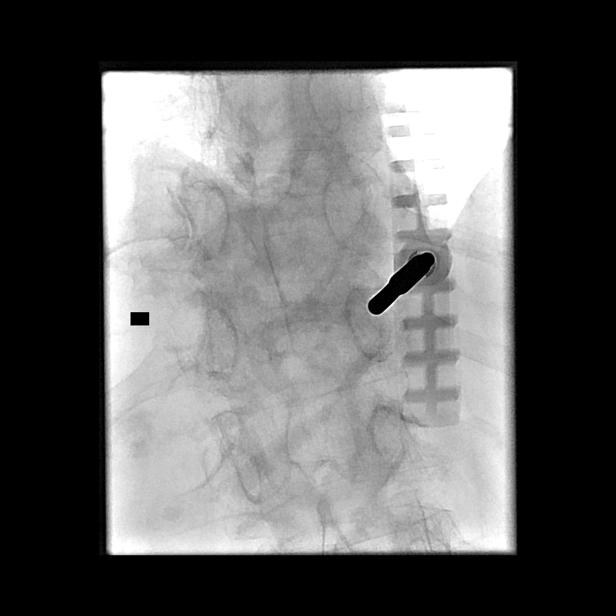

[12 of 12 positions shown; findings below may reference images not displayed]

MEDICATIONS:
As antibiotic prophylaxis, 2 g Ancef was ordered pre-procedure and
administered intravenously within 1 hour of incision.

ANESTHESIA/SEDATION:
Moderate (conscious) sedation was employed during this procedure. A
total of Versed 2.0 mg and Fentanyl 125 mcg was administered
intravenously.

Moderate Sedation Time: 30 minutes. The patient's level of
consciousness and vital signs were monitored continuously by
radiology nursing throughout the procedure under my direct
supervision.

FLUOROSCOPY TIME:  Fluoroscopy Time: 6 minutes 24 seconds (82 mGy)

COMPLICATIONS:
None

PROCEDURE:
Following a full explanation of the procedure along with the
potentially associated complications, a witnessed informed consent
was obtained. Specific risks that were discussed included bleeding,
infection, injury to adjacent structures, neurologic injury,
embolization of cement within the veins, failure of the procedure to
improve pain, need for further procedure/ surgery, cardiopulmonary
collapse, death. The patient understands the risks and wishes to
proceed.

The patient was placed prone on the fluoroscopic table. Nasal oxygen
was administered. Physiologic monitoring was performed throughout
the duration of the procedure. The skin overlying the T12 region was
prepped and draped in the usual sterile fashion. The T12 vertebral
body was identified and the right pedicle was infiltrated with 1%
lidocaine. This was then followed by the advancement of an 8-gauge
[REDACTED] needle through the right pedicle into the anterior
one-third at vertebral body.

The left pedicle was then infiltrated with 1% lidocaine. A small
incision was made with 11 blade scalpel, and then [REDACTED] 8
gauge needle was advanced through the left pedicle into the anterior
[DATE] of the vertebral body.

Simultaneous inflation of the left and right pedicular cannula
balloons was performed under fluoroscopic observation.

Methylmethacrylate mixture was then reconstituted. Under biplane
intermittent fluoroscopy, the methylmethacrylate was then injected
into the cavity within T12 vertebral body with filling of the
fracture cleft.

Minimal contamination of the extra osseous structures identified.
Minimal venous contamination..

The needles were then removed. Hemostasis was achieved at the skin
entry site.

There were no acute complications. Patient tolerated the procedure
well. The patient was observed for 30 minutes and returned to her
room in good condition.
IMPRESSION: Status post image guided T12 vertebral augmentation with kyphoplasty
using bipedicular approach.

## 2019-05-21 ENCOUNTER — Inpatient Hospital Stay (HOSPITAL_BASED_OUTPATIENT_CLINIC_OR_DEPARTMENT_OTHER): Payer: Medicare Other | Admitting: Hematology

## 2019-05-21 ENCOUNTER — Inpatient Hospital Stay: Payer: Medicare Other | Attending: Hematology

## 2019-05-21 ENCOUNTER — Telehealth: Payer: Self-pay | Admitting: Hematology

## 2019-05-21 ENCOUNTER — Other Ambulatory Visit: Payer: Self-pay

## 2019-05-21 VITALS — BP 133/53 | HR 55 | Temp 98.5°F | Resp 18 | Ht 60.0 in | Wt 91.9 lb

## 2019-05-21 DIAGNOSIS — D591 Autoimmune hemolytic anemia, unspecified: Secondary | ICD-10-CM

## 2019-05-21 DIAGNOSIS — R911 Solitary pulmonary nodule: Secondary | ICD-10-CM | POA: Diagnosis not present

## 2019-05-21 DIAGNOSIS — D472 Monoclonal gammopathy: Secondary | ICD-10-CM | POA: Diagnosis not present

## 2019-05-21 DIAGNOSIS — Z87891 Personal history of nicotine dependence: Secondary | ICD-10-CM | POA: Insufficient documentation

## 2019-05-21 DIAGNOSIS — D5912 Cold autoimmune hemolytic anemia: Secondary | ICD-10-CM

## 2019-05-21 DIAGNOSIS — I6523 Occlusion and stenosis of bilateral carotid arteries: Secondary | ICD-10-CM | POA: Diagnosis not present

## 2019-05-21 DIAGNOSIS — J449 Chronic obstructive pulmonary disease, unspecified: Secondary | ICD-10-CM | POA: Diagnosis not present

## 2019-05-21 DIAGNOSIS — R918 Other nonspecific abnormal finding of lung field: Secondary | ICD-10-CM

## 2019-05-21 DIAGNOSIS — E538 Deficiency of other specified B group vitamins: Secondary | ICD-10-CM | POA: Insufficient documentation

## 2019-05-21 DIAGNOSIS — Z79899 Other long term (current) drug therapy: Secondary | ICD-10-CM | POA: Insufficient documentation

## 2019-05-21 DIAGNOSIS — E559 Vitamin D deficiency, unspecified: Secondary | ICD-10-CM

## 2019-05-21 LAB — CBC WITH DIFFERENTIAL/PLATELET
Abs Immature Granulocytes: 0.04 10*3/uL (ref 0.00–0.07)
Basophils Absolute: 0.1 10*3/uL (ref 0.0–0.1)
Basophils Relative: 1 %
Eosinophils Absolute: 0.1 10*3/uL (ref 0.0–0.5)
Eosinophils Relative: 1 %
HCT: 24 % — ABNORMAL LOW (ref 36.0–46.0)
Hemoglobin: 8.5 g/dL — ABNORMAL LOW (ref 12.0–15.0)
Immature Granulocytes: 0 %
Lymphocytes Relative: 25 %
Lymphs Abs: 2.8 10*3/uL (ref 0.7–4.0)
MCH: 38.8 pg — ABNORMAL HIGH (ref 26.0–34.0)
MCHC: 35.4 g/dL (ref 30.0–36.0)
MCV: 109.6 fL — ABNORMAL HIGH (ref 80.0–100.0)
Monocytes Absolute: 0.6 10*3/uL (ref 0.1–1.0)
Monocytes Relative: 6 %
Neutro Abs: 7.5 10*3/uL (ref 1.7–7.7)
Neutrophils Relative %: 67 %
Platelets: 519 10*3/uL — ABNORMAL HIGH (ref 150–400)
RBC: 2.19 MIL/uL — ABNORMAL LOW (ref 3.87–5.11)
RDW: 15.5 % (ref 11.5–15.5)
WBC: 11.1 10*3/uL — ABNORMAL HIGH (ref 4.0–10.5)
nRBC: 0 % (ref 0.0–0.2)

## 2019-05-21 LAB — CMP (CANCER CENTER ONLY)
ALT: 11 U/L (ref 0–44)
AST: 21 U/L (ref 15–41)
Albumin: 4.4 g/dL (ref 3.5–5.0)
Alkaline Phosphatase: 117 U/L (ref 38–126)
Anion gap: 11 (ref 5–15)
BUN: 11 mg/dL (ref 8–23)
CO2: 30 mmol/L (ref 22–32)
Calcium: 9 mg/dL (ref 8.9–10.3)
Chloride: 102 mmol/L (ref 98–111)
Creatinine: 0.69 mg/dL (ref 0.44–1.00)
GFR, Est AFR Am: >60 mL/min (ref >60)
GFR, Estimated: >60 mL/min (ref >60)
Glucose, Bld: 91 mg/dL (ref 70–99)
Potassium: 3.7 mmol/L (ref 3.5–5.1)
Sodium: 143 mmol/L (ref 135–145)
Total Bilirubin: 3 mg/dL — ABNORMAL HIGH (ref 0.3–1.2)
Total Protein: 7.1 g/dL (ref 6.5–8.1)

## 2019-05-21 LAB — VITAMIN B12: Vitamin B-12: 1031 pg/mL — ABNORMAL HIGH (ref 180–914)

## 2019-05-21 LAB — LACTATE DEHYDROGENASE: LDH: 302 U/L — ABNORMAL HIGH (ref 98–192)

## 2019-05-21 NOTE — Telephone Encounter (Signed)
Scheduled appt per 8/28 los.  Voice mailbox was full.  Printed and mailed appt calendar

## 2019-05-21 NOTE — Progress Notes (Signed)
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 The patient's last visit with [place] was on 01/19/2019. The pt reports that she is doing well overall.  The pt reports that her back pain is better, but that her hip and leg pain continue to hurt. She is using a cane to ambulate.   Lab results today (05/21/19) of CBC w/diff and CMP is as follows: all values are WNL except for WBC at 11.5K, RBC at 2.19, Hgb at 8.5, HCT at 24.0, MCV at 109.6, MCH at 38.8, Platelets at 519K, and total bilirubin at 3.0. 05/21/19 LDH at 302  On review of systems, pt denies bowel changes and any other symptoms.    REVIEW OF SYSTEMS:   A 10+ POINT REVIEW OF SYSTEMS WAS OBTAINED including neurology, dermatology, psychiatry, cardiac, respiratory, lymph, extremities, GI, GU, Musculoskeletal, constitutional, breasts, reproductive, HEENT.  All pertinent positives are noted in the HPI.  All others are negative.    Past Medical History:  Diagnosis Date  . B12 deficiency   . Bilateral carotid artery stenosis    right CCA and ICA 1-39%/    left ICA 40-59%  per last duplex  10-19-2015  . COPD (chronic obstructive pulmonary disease) ([place])   . Diverticulosis of colon   . History of diverticulitis of colon    2004  . 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 Renal and ureteric calculus    left    . Past Surgical History:  Procedure Laterality Date  . ABDOMINAL HYSTERECTOMY  1986  . APPENDECTOMY  1967  . BREAST EXCISIONAL BIOPSY Right 1984  . CATARACT EXTRACTION W/ INTRAOCULAR LENS IMPLANT Right 1990's   congential cataract   . CYSTOSCOPY W/ URETERAL STENT PLACEMENT  11/12/2015   Procedure: CYSTOSCOPY WITH RETROGRADE PYELOGRAM/URETERAL STENT PLACEMENT;  Surgeon: Elaine Snare, MD;  Location: [facility];  Service: Urology;;  . Consuela Mimes W/ URETERAL STENT REMOVAL Left 11/22/2015   Procedure: CYSTOSCOPY WITH STENT REMOVAL;  Surgeon: Elaine Snare, MD;  Location: [facility];  Service: Urology;  Laterality: Left;  . CYSTOSCOPY/URETEROSCOPY/HOLMIUM LASER Left 11/22/2015   Procedure: CYSTOSCOPY/URETEROSCOPY/HOLMIUM LASER;  Surgeon: Elaine Snare, MD;  Location: [facility];  Service: Urology;  Laterality: Left;  FLEX URETEROSCOPY  . FEMUR IM NAIL Left 08/04/2015   Procedure: INTRAMEDULLARY (IM) NAIL FEMORAL;  Surgeon: Elaine Butters, MD;  Location: [facility];  Service: Orthopedics;  Laterality: Left;  . IR KYPHO THORACIC WITH BONE BIOPSY  06/22/2018  . TONSILLECTOMY  age 78    . Social History   Tobacco Use  . Smoking status: Former Smoker    Packs/day: 1.00    Types: Cigarettes    Quit date: 08/03/2015    Years since quitting: 3.8  . Smokeless tobacco: Never Used  Substance Use Topics  . Alcohol use: Yes    Comment: occ  . Drug use: Never    ALLERGIES:  is allergic to other and shellfish allergy.  MEDICATIONS:  Current Outpatient Medications  Medication Sig Dispense Refill  . acetaminophen (TYLENOL) 325 MG tablet Take 2 tablets (650 mg total) by mouth every 6 (six)  hours as needed for mild pain (or Fever >/= 101).    . [employer] VITAMIN B12 1000 MCG TBCR TAKE 1 TABLET BY MOUTH EVERY DAY 90 tablet 3  . docusate sodium (COLACE) 100 MG capsule Take 1 capsule (100 mg total) by mouth daily.    . ergocalciferol (VITAMIN D2) 1.25 MG (50000 UT) capsule Take 1 capsule (50,000 Units total) by mouth 2 (two) times a week. 12 capsule 3  . folic acid (FOLVITE) 1 MG tablet Take 2 tablets (2 mg total) by mouth daily. 90 tablet 3  . Multiple Vitamin (MULTIVITAMIN WITH MINERALS) TABS tablet Take 1 tablet by mouth  daily.    Marland Kitchen senna-docusate (SENOKOT-S) 8.6-50 MG tablet Take 2 tablets by mouth 2 (two) times daily.    . Tetrahydrozoline HCl (VISINE OP) Apply 1 drop to eye daily as needed (allergies/dry eyes).    . traMADol (ULTRAM) 50 MG tablet Take 1 tablet (50 mg total) by mouth every 8 (eight) hours as needed. (Patient not taking: Reported on 08/28/2018) 20 tablet 0   No current facility-administered medications for this visit.     PHYSICAL EXAMINATION: ECOG PERFORMANCE STATUS: 1 - Symptomatic but completely ambulatory  Vitals:   05/21/19 1155  BP: (!) 133/53  Pulse: (!) 55  Resp: 18  Temp: 98.5 F (36.9 C)  SpO2: 99%   Filed Weights   05/21/19 1155  Weight: 91 lb 14.4 oz (41.7 kg)   Body mass index is 17.95 kg/m.   GENERAL:alert, in no acute distress and comfortable SKIN: no acute rashes, no significant lesions EYES: conjunctiva are pink and non-injected, sclera anicteric OROPHARYNX: MMM, no exudates, no oropharyngeal erythema or ulceration NECK: supple, no JVD LYMPH:  no palpable lymphadenopathy in the cervical, axillary or inguinal regions LUNGS: clear to auscultation b/l with normal respiratory effort HEART: regular rate & rhythm ABDOMEN:  normoactive bowel sounds , non tender, not distended. Extremity: no pedal edema PSYCH: alert & oriented x 3 with fluent speech NEURO: no focal motor/sensory deficits   LABORATORY DATA:   I have reviewed the data as listed  . CBC Latest Ref Rng & Units 05/21/2019 01/19/2019 10/20/2018  WBC 4.0 - 10.5 K/uL 11.1(H) 11.5(H) 10.6(H)  Hemoglobin 12.0 - 15.0 g/dL 8.5(L) 8.8(L) 8.2(L)  Hematocrit 36.0 - 46.0 % 24.0(L) 25.3(L) 24.4(L)  Platelets 150 - 400 K/uL 519(H) 486(H) 446(H)    CMP Latest Ref Rng & Units 05/21/2019 01/19/2019 10/20/2018  Glucose 70 - 99 mg/dL 91 90 87  BUN 8 - 23 mg/dL 11 11 12   Creatinine 0.44 - 1.00 mg/dL 0.69 0.67 0.63  Sodium 135 - 145 mmol/L 143 143 144  Potassium 3.5 - 5.1 mmol/L 3.7 3.7 3.6  Chloride 98 - 111  mmol/L 102 103 103  CO2 22 - 32 mmol/L 30 30 32  Calcium 8.9 - 10.3 mg/dL 9.0 8.8(L) 8.9  Total Protein 6.5 - 8.1 g/dL 7.1 6.9 6.5  Total Bilirubin 0.3 - 1.2 mg/dL 3.0(H) 2.2(H) 2.5(H)  Alkaline Phos 38 - 126 U/L 117 122 123  AST 15 - 41 U/L 21 22 20   ALT 0 - 44 U/L 11 12 11    . Lab Results  Component Value Date   LDH 302 (H) 05/21/2019          RADIOGRAPHIC STUDIES: I have personally reviewed the radiological images as listed and agreed with the findings in the report. No results found.  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 Her hemoglobin tends to  be lower during winter and has been in the 6-8 range. Overall stable hemoglobin levels in the 6-7 range with folic acid replacement and cold avoidance.  #2 IgM lambda MGUS - cannot rule out possibility of lymphoplasmacytic lymphoma though less likely given chronicity of her condition. 08/28/18 MMP M spike down from 0.8 to 0.4  Flow cytometry- no clonoal B cell lymphoma   #3 history of previous B12 deficiency  #4 hypokalemia- resolved. -Potassium, begin taking BID with orange juice, and pt completed her prednisone course so I expect her potassium levels to increase  -08/28/18 Potassium at 3.6  #5. Low back pain -compression fracture L4 -MRI L spine -Acute/subacute superior endplate fracture at L4 with loss of height of 10-20%. No retropulsed bone. This is quite likely the cause of the low back pain. This was probably present on the radiographs of 01/20/2018, but not diagnosable because of the spinal curvature and unlevel endplates. Old healed superior endplate fracture at [id number] Plan -controlled discomfort -prn Tramadol for back pain   #6 Spiculated Lung nodule in right upper lobe  First seen on 05/28/18 CTA Chest Hasn't changed over 6 month interval with repeat CT Chest in February 2020  10/26/18 CT Chest which revealed There is a spiculated solid nodule of the right upper lobe, measuring 5-6  mm and stable from prior examination. Recommend additional follow-up in 12 months to ensure ongoing stability. 2.  Emphysema. 3. Increased height loss deformities of T11 and T12. PLAN -Would recommend repeating CT Chest in 6 months from 10/26/18 CT  #7 Vitamin D deficiency December 2019 Vitamin D at 9.6  #8 Patient Active Problem List   Diagnosis Date Noted  . Symptomatic anemia 05/28/2018  . 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 Occult blood in stools 02/04/2018  . Ureteral calculus 11/12/2015  . Breast cancer screening, high risk patient 08/29/2015  . Acute delirium 08/07/2015  . Postoperative anemia due to acute blood loss 08/06/2015  . Protein-calorie malnutrition, severe 08/04/2015  . Closed intertrochanteric fracture of left femur ([place]) 08/03/2015  . Fall 08/03/2015  . Closed right hip fracture ([place]) 08/03/2015  . Hyperbilirubinemia 08/01/2013  . Hypokalemia 07/30/2013  . Vitamin B 12 deficiency 08/07/2012  . 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 Tobacco abuse 06/10/2012   -Continue follow-up with primary care physician for management of other medical comorbidities  -ALL TRANSFUSION PRODUCTS SHOULD BE PRE-WARMED USING A BLOOD WARMER INCLUDING IV FLUIDS, IF ANY.    PLAN: -Discussed pt labwork today, 05/21/19; all values are WNL except for WBC at 11.5K, RBC at 2.19, Hgb at 8.5, HCT at 24.0, MCV at 109.6, MCH at 38.8, Platelets at 519K, and total bilirubin at 3.0. 05/21/19 LDH at 302 -no indication for PRBC transfusion or additional rx of cold agglutinin disease at this time. Continue to maintain cold avoidance and precautions. -Order a CT scan of chest wo contrast to follow up on lung nodule -See back in 3 months   FOLLOW UP: Ct chest wo contrast in 12 weeks RTC with [name] with labs in 3 months   The total time spent in the appt was 20 minutes and more than 50% was on  counseling and direct patient cares.  All of the patient's questions were answered with apparent satisfaction. The patient knows to call the clinic with any problems, questions or concerns.    Elaine Lone MD [place] AAHIVMS [facility] Hematology/Oncology Physician [place]  [facility]  (Office):       [phone] (Work cell):  [phone] (Fax):           [phone]  I, [name], am acting as a Education administrator for Elaine. Sullivan Cunningham.   .I have reviewed the above documentation for accuracy and completeness, and I agree with the above. Elaine Genera MD

## 2019-05-22 LAB — VITAMIN D 25 HYDROXY (VIT D DEFICIENCY, FRACTURES): Vit D, 25-Hydroxy: 39.8 ng/mL (ref 30.0–100.0)

## 2019-05-24 ENCOUNTER — Telehealth: Payer: Self-pay | Admitting: *Deleted

## 2019-05-24 NOTE — Telephone Encounter (Signed)
Contacted by patient - confirmed that Dr. Irene Limbo ordered CT scan during appt on 8/28. Gave her number for [facility] and advised her that if she had not been contacted in 3-4 days to call them to make appt. She verbalized understanding.

## 2019-05-25 ENCOUNTER — Telehealth: Payer: Self-pay | Admitting: Hematology

## 2019-05-25 NOTE — Telephone Encounter (Signed)
Called pt per 8/31 sch message - pt is aware of upcoming appts and let pt know [facility] will call about ct .

## 2019-07-30 ENCOUNTER — Other Ambulatory Visit: Payer: Self-pay | Admitting: Hematology

## 2019-07-30 DIAGNOSIS — D5912 Cold autoimmune hemolytic anemia: Secondary | ICD-10-CM

## 2019-07-30 DIAGNOSIS — D591 Autoimmune hemolytic anemia, unspecified: Secondary | ICD-10-CM

## 2019-08-08 ENCOUNTER — Other Ambulatory Visit: Payer: Self-pay | Admitting: Hematology

## 2019-08-13 ENCOUNTER — Ambulatory Visit (HOSPITAL_COMMUNITY)
Admission: RE | Admit: 2019-08-13 | Discharge: 2019-08-13 | Disposition: A | Discharge status: Home / Self Care | Payer: Medicare Other | Source: Ambulatory Visit | Attending: Hematology | Admitting: Hematology

## 2019-08-13 ENCOUNTER — Other Ambulatory Visit: Payer: Self-pay

## 2019-08-13 DIAGNOSIS — R918 Other nonspecific abnormal finding of lung field: Secondary | ICD-10-CM

## 2019-08-23 NOTE — Progress Notes (Signed)
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 The patient's last visit with [place] was on 05/21/2019. The pt reports that she is doing well overall.  The pt reports that she had a good, and socially-distanced Thanksgiving with her mother. She his continued to have back, hip, and leg pain. Pt has continued to take a Vitamin B12 daily as well as a B-complex vitamin. She has two family members who are suspected to have COVID-6 but she has not been around them in the last month. Pt has had her annual flu shot and believes that she is up to date with her pneumonia vaccines. Her PCP will be keeping up with her pneumonia vaccinations. She has continued to walk everyday but notes a tightness in her abdominal area.  Of note since the patient's last visit, pt has had CT Chest ([id number]) completed on 08/13/2019 with results revealing "4 mm posterior right upper lobe pulmonary nodule, unchanged. Dedicated follow-up imaging is not required per Fleischner Society guidelines. This recommendation follows the consensus statement Guidelines for Management of Small Pulmonary Nodules Detected on CT Images: From the Fleischner Society 2017; Radiology 2017; 284:228-243. Aortic Atherosclerosis (ICD10-I70.0) and Emphysema (ICD10-J43.9)."  Lab results today (08/24/19) of CBC w/diff and CMP is as follows: all values are WNL except for WBC at 12.1K, RBC at 2.30, Hgb at 8.1, HCT at 24.3, MCV at 105.7, MCH at 35.2, PLTs at 463K, Neutro Abs at 8.6K, Total Bilirubin at 3.1. 08/24/2019 LDH at 297  On review of systems, pt reports back/hip/leg pain, abdominal tightness and  denies lightheadedness, dizziness, fatigue, abdominal pain, bowel movement issues and any other symptoms.    REVIEW OF SYSTEMS:   A 10+ POINT REVIEW OF SYSTEMS WAS OBTAINED including neurology, dermatology, psychiatry, cardiac, respiratory, lymph, extremities, GI, GU, Musculoskeletal, constitutional, breasts, reproductive, HEENT.  All pertinent positives are noted in the HPI.  All others are negative.   Past Medical History:  Diagnosis Date  . B12 deficiency   . Bilateral carotid artery stenosis    right CCA and ICA 1-39%/    left ICA 40-59%  per last duplex  10-19-2015  . COPD (chronic obstructive pulmonary disease) ([place])   . Diverticulosis of colon   . History of diverticulitis of colon    2004  . 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 Renal and ureteric calculus    left    . Past Surgical History:  Procedure Laterality Date  . ABDOMINAL HYSTERECTOMY  1986  . APPENDECTOMY  1967  . BREAST EXCISIONAL BIOPSY Right 1984  . CATARACT EXTRACTION W/ INTRAOCULAR LENS IMPLANT Right 1990's   congential cataract  . CYSTOSCOPY W/ URETERAL STENT PLACEMENT  11/12/2015   Procedure: CYSTOSCOPY WITH RETROGRADE PYELOGRAM/URETERAL STENT PLACEMENT;  Surgeon: [name], MD;  Location: [facility];  Service: Urology;;  . Consuela Mimes W/ URETERAL STENT REMOVAL Left 11/22/2015   Procedure: CYSTOSCOPY WITH STENT REMOVAL;  Surgeon: [name], MD;  Location[facility];  Service: Urology;  Laterality: Left;  . CYSTOSCOPY/URETEROSCOPY/HOLMIUM LASER Left 11/22/2015   Procedure: CYSTOSCOPY/URETEROSCOPY/HOLMIUM LASER;  Surgeon: [name], MD;  Location: [facility];  Service: Urology;  Laterality: Left;  FLEX URETEROSCOPY  . FEMUR IM NAIL Left 08/04/2015   Procedure: INTRAMEDULLARY (IM) NAIL FEMORAL;  Surgeon: [name], MD;  Location: [facility];  Service: Orthopedics;  Laterality: Left;  . IR KYPHO THORACIC WITH BONE BIOPSY  06/22/2018  .  TONSILLECTOMY  age 70    . Social History   Tobacco Use  . Smoking status: Former Smoker    Packs/day: 1.00    Types: Cigarettes    Quit date: 08/03/2015    Years since quitting: 4.0  . Smokeless tobacco: Never Used  Substance Use Topics  . Alcohol use: Yes    Comment: occ  . Drug use: Never    ALLERGIES:  is allergic to other and shellfish allergy.  MEDICATIONS:  Current Outpatient Medications  Medication Sig Dispense Refill  . acetaminophen (TYLENOL) 325 MG tablet Take 2 tablets (650 mg total) by mouth every 6 (six) hours as needed for mild pain (or Fever >/= 101).    . [employer] VITAMIN B12 1000 MCG TBCR TAKE 1 TABLET BY MOUTH EVERY DAY 90 tablet 3  . docusate sodium (COLACE) 100 MG capsule Take 1 capsule (100 mg total) by mouth daily.    . ergocalciferol (VITAMIN D2) 1.25 MG (50000 UT) capsule Take 1 capsule (50,000 Units total) by mouth 2 (two) times a week. 12 capsule 3  . folic acid (FOLVITE) 1 MG tablet TAKE 2 TABLETS BY MOUTH EVERY DAY 180 tablet 1  . Multiple Vitamin (MULTIVITAMIN WITH MINERALS) TABS tablet Take 1 tablet by mouth daily.    Marland Kitchen senna-docusate (SENOKOT-S) 8.6-50 MG tablet Take 2 tablets by mouth 2 (two) times daily.    . Tetrahydrozoline HCl (VISINE OP) Apply 1 drop to eye daily as needed (allergies/dry eyes).    . traMADol (ULTRAM) 50 MG tablet Take 1 tablet (50 mg total) by mouth every 8 (eight) hours as needed. (Patient not taking: Reported on 08/28/2018) 20 tablet 0   No current facility-administered medications for this visit.     PHYSICAL EXAMINATION: ECOG PERFORMANCE STATUS: 1 - Symptomatic but completely ambulatory  Vitals:   08/24/19 1144  BP: (!) 136/50  Pulse: 71  Resp: 18  Temp: 98.7 F (37.1 C)  SpO2: 97%   Filed Weights   08/24/19 1144  Weight: 94 lb 6.4 oz (42.8 kg)   Body mass index is 18.44 kg/m.   Exam was given in a chair   GENERAL:alert, in no acute distress and comfortable SKIN: no acute rashes, no significant lesions  EYES: conjunctiva are pink and non-injected, sclera anicteric OROPHARYNX: MMM, no exudates, no oropharyngeal erythema or ulceration NECK: supple, no JVD LYMPH:  no palpable lymphadenopathy in the cervical, axillary or inguinal regions LUNGS: clear to auscultation b/l with normal respiratory effort HEART: regular rate & rhythm ABDOMEN:  normoactive bowel sounds , non tender, not distended. No palpable hepatosplenomegaly.  Extremity: no pedal edema PSYCH: alert & oriented x 3 with fluent speech NEURO: no focal motor/sensory deficits  LABORATORY DATA:   I have reviewed the data as listed  . CBC Latest Ref Rng & Units 08/24/2019 05/21/2019 01/19/2019  WBC 4.0 - 10.5 K/uL 12.1(H) 11.1(H) 11.5(H)  Hemoglobin 12.0 - 15.0 g/dL 8.1(L) 8.5(L) 8.8(L)  Hematocrit 36.0 - 46.0 % 24.3(L) 24.0(L) 25.3(L)  Platelets 150 - 400 K/uL 463(H) 519(H) 486(H)    CMP Latest Ref Rng & Units 08/24/2019 05/21/2019 01/19/2019  Glucose 70 - 99 mg/dL 91 91 90  BUN 8 - 23  mg/dL 11 11 11   Creatinine 0.44 - 1.00 mg/dL 0.63 0.69 0.67  Sodium 135 - 145 mmol/L 144 143 143  Potassium 3.5 - 5.1 mmol/L 3.8 3.7 3.7  Chloride 98 - 111 mmol/L 104 102 103  CO2 22 - 32 mmol/L 31 30 30   Calcium 8.9 - 10.3 mg/dL 9.2 9.0 8.8(L)  Total Protein 6.5 - 8.1 g/dL 7.1 7.1 6.9  Total Bilirubin 0.3 - 1.2 mg/dL 3.1(H) 3.0(H) 2.2(H)  Alkaline Phos 38 - 126 U/L 108 117 122  AST 15 - 41 U/L 19 21 22   ALT 0 - 44 U/L 12 11 12    . Lab Results  Component Value Date   LDH 297 (H) 08/24/2019          RADIOGRAPHIC STUDIES: I have personally reviewed the radiological images as listed and agreed with the findings in the report. Ct Chest Wo Contrast  Result Date: 08/13/2019 CLINICAL DATA:  Follow-up pulmonary nodule EXAM: CT CHEST WITHOUT CONTRAST TECHNIQUE: Multidetector CT imaging of the chest was performed following the standard protocol without IV contrast. COMPARISON:  10/26/2018 FINDINGS: Cardiovascular: Heart is top-normal in  size. No pericardial effusion. No evidence of thoracic aortic aneurysm. Atherosclerotic calcifications of the aortic arch. Mild coronary atherosclerosis of the left circumflex. Mediastinum/Nodes: No suspicious mediastinal adenopathy. Scattered left thyroid nodules measuring up to 8 mm. Lungs/Pleura: Mild centrilobular emphysematous changes, upper lung predominant. Linear scarring/atelectasis in the right lower lobe. 4 mm posterior right upper lobe pulmonary nodule (series 4/image 87), mildly irregular but unchanged. No new/suspicious pulmonary nodules. No pleural effusion or pneumothorax. Upper Abdomen: Visualized upper abdomen is notable for a 3 mm nonobstructing left upper pole renal calculus and vascular calcifications. Musculoskeletal: Degenerative changes of the visualized thoracolumbar spine. Mild superior endplate changes at 624THL. Prior vertebral augmentation at T12. IMPRESSION: 4 mm posterior right upper lobe pulmonary nodule, unchanged. Dedicated follow-up imaging is not required per Fleischner Society guidelines. This recommendation follows the consensus statement: Guidelines for Management of Small Pulmonary Nodules Detected on CT Images: From the Fleischner Society 2017; Radiology 2017; 284:228-243. Aortic Atherosclerosis (ICD10-I70.0) and Emphysema (ICD10-J43.9). Electronically Signed   By: Julian Hy M.D.   On: 08/13/2019 13:06    ASSESSMENT & PLAN:   #1: Cold Agglutinin related chronic hemolytic anemia. Her hemoglobin tends to be lower during winter and has been in the 6-8 range. Overall stable hemoglobin levels in the 6-7 range with folic acid replacement and cold avoidance.  #2 IgM lambda MGUS - cannot rule out possibility of lymphoplasmacytic lymphoma though less likely given chronicity of her condition. 08/28/18 MMP M spike down from 0.8 to 0.4  Flow cytometry- no clonoal B cell lymphoma   #3 History of previous B12 deficiency  #4 hypokalemia- resolved. -Potassium, begin  taking BID with orange juice, and pt completed her prednisone course so I expect her potassium levels to increase  -08/28/18 Potassium at 3.6  #5. Low back pain -compression fracture L4 -MRI L spine -Acute/subacute superior endplate fracture at L4 with loss of height of 10-20%. No retropulsed bone. This is quite likely the cause of the low back pain. This was probably present on the radiographs of 01/20/2018, but not diagnosable because of the spinal curvature and unlevel endplates. Old healed superior endplate fracture at 624THL Plan -controlled discomfort -prn Tramadol for back pain   #6 Spiculated Lung nodule in right upper lobe  First seen on 05/28/18 CTA Chest Hasn't changed over 6 month interval with repeat CT Chest in February 2020  10/26/18  CT Chest which revealed There is a spiculated solid nodule of the right upper lobe, measuring 5-6 mm and stable from prior examination. Recommend additional follow-up in 12 months to ensure ongoing stability. 2.  Emphysema. 3. Increased height loss deformities of T11 and T12. PLAN -Would recommend repeating CT Chest in 6 months from 10/26/18 CT  08/13/2019 CT Chest ([id number]) revealed "4 mm posterior right upper lobe pulmonary nodule, unchanged. Dedicated follow-up imaging is not required per Fleischner Society guidelines. This recommendation follows the consensus statement Guidelines for Management of Small Pulmonary Nodules Detected on CT Images: From the Fleischner Society 2017; Radiology 2017; 284:228-243. Aortic Atherosclerosis (ICD10-I70.0) and Emphysema (ICD10-J43.9)."  #7 Vitamin D deficiency December 2019 Vitamin D at 9.6  #8 Patient Active Problem List   Diagnosis Date Noted  . Symptomatic anemia 05/28/2018  . 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 Occult blood in stools 02/04/2018  . Ureteral calculus 11/12/2015  . Breast cancer screening, high risk patient 08/29/2015  . Acute  delirium 08/07/2015  . Postoperative anemia due to acute blood loss 08/06/2015  . Protein-calorie malnutrition, severe 08/04/2015  . Closed intertrochanteric fracture of left femur ([place]) 08/03/2015  . Fall 08/03/2015  . Closed right hip fracture ([place]) 08/03/2015  . Hyperbilirubinemia 08/01/2013  . Hypokalemia 07/30/2013  . Vitamin B 12 deficiency 08/07/2012  . Idiopathic chronic cold agglutinin disease 06/10/2012  . Tobacco abuse 06/10/2012   -Continue follow-up with primary care physician for management of other medical comorbidities  -ALL TRANSFUSION PRODUCTS SHOULD BE PRE-WARMED USING A BLOOD WARMER INCLUDING IV FLUIDS, IF ANY.   PLAN: -Discussed pt labwork today, 08/24/19; all values are WNL except for WBC at 12.1K, RBC at 2.30, Hgb at 8.1, HCT at 24.3, MCV at 105.7, MCH at 35.2, PLTs at 463K, Neutro Abs at 8.6K, Total Bilirubin at 3.1.  -Pt's Hgb is good for this time of year -No indication for PRBC transfusion or additional rx of cold agglutinin disease at this time. Continue to maintain cold avoidance and precautions. -Discussed 08/24/2019 LDH is at baseline at 297 -Discussed 08/13/2019 CT Chest ([id number]) which shows her lung nodule is still unchanged -Will consider repeat scans if symptoms change -Continue Vitamin B12 -Continue B-complex vitamin -Discussed proper safety precautions during the pandemic -Pt has had her annual flu vaccine -Pt's PCP is keeping up with her pneumonia vaccinations -Will see back in 3 months with labs   FOLLOW UP: RTC with Dr Irene Limbo with labs in 3 months  The total time spent in the appt was 15 minutes and more than 50% was on counseling and direct patient cares.  All of the patient's questions were answered with apparent satisfaction. The patient knows to call the clinic with any problems, questions or concerns.  Sullivan Lone MD [place] [facility]AHIVMS [facility] Hematology/Oncology Physician [facility]  (Office):       [phone] (Work  cell):  [phone] (Fax):           [phone]  I, Yevette Edwards, am acting as a scribe for Dr. Sullivan Lone.   .I have reviewed the above documentation for accuracy and completeness, and I agree with the above. Brunetta Genera MD

## 2019-08-24 ENCOUNTER — Telehealth: Payer: Self-pay | Admitting: Hematology

## 2019-08-24 ENCOUNTER — Inpatient Hospital Stay: Payer: Medicare Other | Attending: Hematology

## 2019-08-24 ENCOUNTER — Other Ambulatory Visit: Payer: Self-pay

## 2019-08-24 ENCOUNTER — Inpatient Hospital Stay (HOSPITAL_BASED_OUTPATIENT_CLINIC_OR_DEPARTMENT_OTHER): Payer: Medicare Other | Admitting: Hematology

## 2019-08-24 VITALS — BP 136/50 | HR 71 | Temp 98.7°F | Resp 18 | Ht 60.0 in | Wt 94.4 lb

## 2019-08-24 DIAGNOSIS — I6523 Occlusion and stenosis of bilateral carotid arteries: Secondary | ICD-10-CM | POA: Diagnosis not present

## 2019-08-24 DIAGNOSIS — J439 Emphysema, unspecified: Secondary | ICD-10-CM | POA: Diagnosis not present

## 2019-08-24 DIAGNOSIS — D591 Autoimmune hemolytic anemia, unspecified: Secondary | ICD-10-CM

## 2019-08-24 DIAGNOSIS — R918 Other nonspecific abnormal finding of lung field: Secondary | ICD-10-CM | POA: Diagnosis not present

## 2019-08-24 DIAGNOSIS — I700 Atherosclerosis of aorta: Secondary | ICD-10-CM | POA: Diagnosis not present

## 2019-08-24 DIAGNOSIS — E538 Deficiency of other specified B group vitamins: Secondary | ICD-10-CM | POA: Diagnosis not present

## 2019-08-24 DIAGNOSIS — M549 Dorsalgia, unspecified: Secondary | ICD-10-CM | POA: Insufficient documentation

## 2019-08-24 DIAGNOSIS — Z8719 Personal history of other diseases of the digestive system: Secondary | ICD-10-CM | POA: Diagnosis not present

## 2019-08-24 DIAGNOSIS — E559 Vitamin D deficiency, unspecified: Secondary | ICD-10-CM | POA: Insufficient documentation

## 2019-08-24 DIAGNOSIS — Z79899 Other long term (current) drug therapy: Secondary | ICD-10-CM | POA: Diagnosis not present

## 2019-08-24 DIAGNOSIS — Z87442 Personal history of urinary calculi: Secondary | ICD-10-CM | POA: Diagnosis not present

## 2019-08-24 DIAGNOSIS — D5912 Cold autoimmune hemolytic anemia: Secondary | ICD-10-CM

## 2019-08-24 DIAGNOSIS — Z87891 Personal history of nicotine dependence: Secondary | ICD-10-CM | POA: Diagnosis not present

## 2019-08-24 LAB — CBC WITH DIFFERENTIAL/PLATELET
Abs Immature Granulocytes: 0.03 10*3/uL (ref 0.00–0.07)
Basophils Absolute: 0.1 10*3/uL (ref 0.0–0.1)
Basophils Relative: 1 %
Eosinophils Absolute: 0.1 10*3/uL (ref 0.0–0.5)
Eosinophils Relative: 1 %
HCT: 24.3 % — ABNORMAL LOW (ref 36.0–46.0)
Hemoglobin: 8.1 g/dL — ABNORMAL LOW (ref 12.0–15.0)
Immature Granulocytes: 0 %
Lymphocytes Relative: 21 %
Lymphs Abs: 2.6 10*3/uL (ref 0.7–4.0)
MCH: 35.2 pg — ABNORMAL HIGH (ref 26.0–34.0)
MCHC: 33.3 g/dL (ref 30.0–36.0)
MCV: 105.7 fL — ABNORMAL HIGH (ref 80.0–100.0)
Monocytes Absolute: 0.7 10*3/uL (ref 0.1–1.0)
Monocytes Relative: 5 %
Neutro Abs: 8.6 10*3/uL — ABNORMAL HIGH (ref 1.7–7.7)
Neutrophils Relative %: 72 %
Platelets: 463 10*3/uL — ABNORMAL HIGH (ref 150–400)
RBC: 2.3 MIL/uL — ABNORMAL LOW (ref 3.87–5.11)
RDW: 14.8 % (ref 11.5–15.5)
WBC: 12.1 10*3/uL — ABNORMAL HIGH (ref 4.0–10.5)
nRBC: 0 % (ref 0.0–0.2)

## 2019-08-24 LAB — CMP (CANCER CENTER ONLY)
ALT: 12 U/L (ref 0–44)
AST: 19 U/L (ref 15–41)
Albumin: 4.4 g/dL (ref 3.5–5.0)
Alkaline Phosphatase: 108 U/L (ref 38–126)
Anion gap: 9 (ref 5–15)
BUN: 11 mg/dL (ref 8–23)
CO2: 31 mmol/L (ref 22–32)
Calcium: 9.2 mg/dL (ref 8.9–10.3)
Chloride: 104 mmol/L (ref 98–111)
Creatinine: 0.63 mg/dL (ref 0.44–1.00)
GFR, Est AFR Am: >60 mL/min (ref >60)
GFR, Estimated: >60 mL/min (ref >60)
Glucose, Bld: 91 mg/dL (ref 70–99)
Potassium: 3.8 mmol/L (ref 3.5–5.1)
Sodium: 144 mmol/L (ref 135–145)
Total Bilirubin: 3.1 mg/dL — ABNORMAL HIGH (ref 0.3–1.2)
Total Protein: 7.1 g/dL (ref 6.5–8.1)

## 2019-08-24 LAB — SAMPLE TO BLOOD BANK

## 2019-08-24 LAB — LACTATE DEHYDROGENASE: LDH: 297 U/L — ABNORMAL HIGH (ref 98–192)

## 2019-08-24 NOTE — Telephone Encounter (Signed)
Gave avs and calendar ° °

## 2019-11-19 ENCOUNTER — Other Ambulatory Visit: Payer: Self-pay

## 2019-11-19 DIAGNOSIS — D5912 Cold autoimmune hemolytic anemia: Secondary | ICD-10-CM

## 2019-11-22 ENCOUNTER — Inpatient Hospital Stay: Payer: Medicare Other

## 2019-11-22 ENCOUNTER — Inpatient Hospital Stay: Payer: Medicare Other | Attending: Hematology | Admitting: Hematology

## 2019-11-22 ENCOUNTER — Other Ambulatory Visit: Payer: Self-pay

## 2019-11-22 VITALS — BP 140/55 | HR 66 | Temp 98.2°F | Resp 17 | Ht 60.0 in | Wt 94.6 lb

## 2019-11-22 DIAGNOSIS — M549 Dorsalgia, unspecified: Secondary | ICD-10-CM | POA: Diagnosis not present

## 2019-11-22 DIAGNOSIS — D5912 Cold autoimmune hemolytic anemia: Secondary | ICD-10-CM

## 2019-11-22 DIAGNOSIS — E559 Vitamin D deficiency, unspecified: Secondary | ICD-10-CM | POA: Diagnosis not present

## 2019-11-22 DIAGNOSIS — M25552 Pain in left hip: Secondary | ICD-10-CM | POA: Diagnosis not present

## 2019-11-22 DIAGNOSIS — Z79899 Other long term (current) drug therapy: Secondary | ICD-10-CM | POA: Insufficient documentation

## 2019-11-22 DIAGNOSIS — D472 Monoclonal gammopathy: Secondary | ICD-10-CM | POA: Diagnosis not present

## 2019-11-22 DIAGNOSIS — M4856XD Collapsed vertebra, not elsewhere classified, lumbar region, subsequent encounter for fracture with routine healing: Secondary | ICD-10-CM | POA: Diagnosis not present

## 2019-11-22 DIAGNOSIS — D591 Autoimmune hemolytic anemia, unspecified: Secondary | ICD-10-CM | POA: Diagnosis not present

## 2019-11-22 DIAGNOSIS — J449 Chronic obstructive pulmonary disease, unspecified: Secondary | ICD-10-CM | POA: Diagnosis not present

## 2019-11-22 DIAGNOSIS — I2510 Atherosclerotic heart disease of native coronary artery without angina pectoris: Secondary | ICD-10-CM | POA: Diagnosis not present

## 2019-11-22 DIAGNOSIS — Z87891 Personal history of nicotine dependence: Secondary | ICD-10-CM | POA: Insufficient documentation

## 2019-11-22 DIAGNOSIS — M79604 Pain in right leg: Secondary | ICD-10-CM | POA: Insufficient documentation

## 2019-11-22 DIAGNOSIS — M25551 Pain in right hip: Secondary | ICD-10-CM | POA: Insufficient documentation

## 2019-11-22 DIAGNOSIS — M79605 Pain in left leg: Secondary | ICD-10-CM | POA: Insufficient documentation

## 2019-11-22 DIAGNOSIS — E538 Deficiency of other specified B group vitamins: Secondary | ICD-10-CM

## 2019-11-22 LAB — CBC WITH DIFFERENTIAL (CANCER CENTER ONLY)
Abs Immature Granulocytes: 0.04 10*3/uL (ref 0.00–0.07)
Basophils Absolute: 0.1 10*3/uL (ref 0.0–0.1)
Basophils Relative: 1 %
Eosinophils Absolute: 0.1 10*3/uL (ref 0.0–0.5)
Eosinophils Relative: 1 %
HCT: 25.2 % — ABNORMAL LOW (ref 36.0–46.0)
Hemoglobin: 8.4 g/dL — ABNORMAL LOW (ref 12.0–15.0)
Immature Granulocytes: 0 %
Lymphocytes Relative: 25 %
Lymphs Abs: 2.8 10*3/uL (ref 0.7–4.0)
MCH: 35 pg — ABNORMAL HIGH (ref 26.0–34.0)
MCHC: 33.3 g/dL (ref 30.0–36.0)
MCV: 105 fL — ABNORMAL HIGH (ref 80.0–100.0)
Monocytes Absolute: 0.6 10*3/uL (ref 0.1–1.0)
Monocytes Relative: 5 %
Neutro Abs: 7.8 10*3/uL — ABNORMAL HIGH (ref 1.7–7.7)
Neutrophils Relative %: 68 %
Platelet Count: 515 10*3/uL — ABNORMAL HIGH (ref 150–400)
RBC: 2.4 MIL/uL — ABNORMAL LOW (ref 3.87–5.11)
RDW: 15.3 % (ref 11.5–15.5)
WBC Count: 11.3 10*3/uL — ABNORMAL HIGH (ref 4.0–10.5)
nRBC: 0 % (ref 0.0–0.2)

## 2019-11-22 LAB — CMP (CANCER CENTER ONLY)
ALT: 9 U/L (ref 0–44)
AST: 16 U/L (ref 15–41)
Albumin: 4.2 g/dL (ref 3.5–5.0)
Alkaline Phosphatase: 103 U/L (ref 38–126)
Anion gap: 7 (ref 5–15)
BUN: 14 mg/dL (ref 8–23)
CO2: 31 mmol/L (ref 22–32)
Calcium: 8.8 mg/dL — ABNORMAL LOW (ref 8.9–10.3)
Chloride: 104 mmol/L (ref 98–111)
Creatinine: 0.69 mg/dL (ref 0.44–1.00)
GFR, Est AFR Am: >60 mL/min (ref >60)
GFR, Estimated: >60 mL/min (ref >60)
Glucose, Bld: 68 mg/dL — ABNORMAL LOW (ref 70–99)
Potassium: 3.8 mmol/L (ref 3.5–5.1)
Sodium: 142 mmol/L (ref 135–145)
Total Bilirubin: 1.9 mg/dL — ABNORMAL HIGH (ref 0.3–1.2)
Total Protein: 6.9 g/dL (ref 6.5–8.1)

## 2019-11-22 LAB — LACTATE DEHYDROGENASE: LDH: 251 U/L — ABNORMAL HIGH (ref 98–192)

## 2019-11-22 LAB — SAMPLE TO BLOOD BANK

## 2019-11-22 NOTE — Progress Notes (Signed)
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 The patient's last visit with [place] was on 08/24/2019. The pt reports that she is doing well overall.  The pt reports that she has been having back, hip, and leg pain. She has been told that these are likely arthritic changes and that there is nothing much that can be done about the discomfort. Pt has been walking outside regularly and even walks in the house if the weather is bad outside.  She has continued taking the Vitamin D and B-complex vitamin supplements as recommended. She has received both doses of the COVID19 vaccine. She denies any concerns or residual symptoms.   Lab results today (11/22/19) of CBC w/diff and CMP is as follows: all values are WNL except for WBC at 11.3K, RBC at 2.40, Hgb at 8.4, HCT at 25.2, MCV at 105.0, MCH at 35.0, PLT at 515K, Neutro Abs at 7.8K, Glucose at 68, Calcium at 8.8, Total Bilirubin at 1.9. 11/22/2019 LDH at 251   On review of systems, pt reports back pain, hip pain, leg pain and denies fatigue, dizziness, fevers, chills, night sweats, lightheadedness, SOB, chest pain, abdominal pain, constipation and any other symptoms.    REVIEW OF SYSTEMS:   A 10+ POINT REVIEW OF SYSTEMS WAS OBTAINED including neurology, dermatology, psychiatry, cardiac, respiratory, lymph, extremities, GI, GU, Musculoskeletal, constitutional, breasts, reproductive, HEENT.  All pertinent positives are noted in the HPI.  All others are negative.   Past Medical History:  Diagnosis Date  . B12 deficiency   . Bilateral carotid artery stenosis    right CCA and ICA 1-39%/    left ICA  40-59%  per last duplex  10-19-2015  . COPD (chronic obstructive pulmonary disease) ([place])   . Diverticulosis of colon   . History of diverticulitis of colon    2004  . 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 Renal and ureteric calculus    left    . Past Surgical History:  Procedure Laterality Date  . ABDOMINAL HYSTERECTOMY  1986  . APPENDECTOMY  1967  . BREAST EXCISIONAL BIOPSY Right 1984  . CATARACT EXTRACTION W/ INTRAOCULAR LENS IMPLANT Right 1990's   congential cataract  . CYSTOSCOPY W/ URETERAL STENT PLACEMENT  11/12/2015   Procedure: CYSTOSCOPY WITH RETROGRADE PYELOGRAM/URETERAL STENT PLACEMENT;  Surgeon: [name], MD;  Location: [facility];  Service: Urology;;  . Consuela Mimes W/ URETERAL STENT REMOVAL Left 11/22/2015   Procedure: CYSTOSCOPY WITH STENT REMOVAL;  Surgeon: [name], MD;  Location: [facility];  Service: Urology;  Laterality: Left;  . CYSTOSCOPY/URETEROSCOPY/HOLMIUM LASER Left 11/22/2015   Procedure: CYSTOSCOPY/URETEROSCOPY/HOLMIUM LASER;  Surgeon: [name], MD;  Location: [facility];  Service: Urology;  Laterality: Left;  FLEX URETEROSCOPY  . FEMUR IM NAIL Left 08/04/2015   Procedure: INTRAMEDULLARY (IM) NAIL FEMORAL;  Surgeon: [name], MD;  Location: [facility];  Service: Orthopedics;  Laterality: Left;  . IR KYPHO THORACIC WITH BONE BIOPSY  06/22/2018  . TONSILLECTOMY  age 79    . Social History   Tobacco Use  . Smoking status: Former Smoker  Packs/day: 1.00    Types: Cigarettes    Quit date: 08/03/2015    Years since quitting: 4.3  . Smokeless tobacco: Never Used  Substance Use Topics  . Alcohol use: Yes    Comment: occ  . Drug use: Never    ALLERGIES:  is allergic to other and shellfish allergy.  MEDICATIONS:  Current Outpatient Medications  Medication Sig Dispense Refill  . acetaminophen (TYLENOL) 325 MG tablet Take 2 tablets (650 mg total) by mouth every 6 (six)  hours as needed for mild pain (or Fever >/= 101).    . CVS VITAMIN B12 1000 MCG TBCR TAKE 1 TABLET BY MOUTH EVERY DAY 90 tablet 3  . docusate sodium (COLACE) 100 MG capsule Take 1 capsule (100 mg total) by mouth daily.    . ergocalciferol (VITAMIN D2) 1.25 MG (50000 UT) capsule Take 1 capsule (50,000 Units total) by mouth 2 (two) times a week. 12 capsule 3  . folic acid (FOLVITE) 1 MG tablet TAKE 2 TABLETS BY MOUTH EVERY DAY 180 tablet 1  . Multiple Vitamin (MULTIVITAMIN WITH MINERALS) TABS tablet Take 1 tablet by mouth daily.    Marland Kitchen senna-docusate (SENOKOT-S) 8.6-50 MG tablet Take 2 tablets by mouth 2 (two) times daily.    . Tetrahydrozoline HCl (VISINE OP) Apply 1 drop to eye daily as needed (allergies/dry eyes).    . traMADol (ULTRAM) 50 MG tablet Take 1 tablet (50 mg total) by mouth every 8 (eight) hours as needed. (Patient not taking: Reported on 08/28/2018) 20 tablet 0   No current facility-administered medications for this visit.    PHYSICAL EXAMINATION: ECOG PERFORMANCE STATUS: 1 - Symptomatic but completely ambulatory  Vitals:   11/22/19 1350  BP: (!) 140/55  Pulse: 66  Resp: 17  Temp: 98.2 F (36.8 C)  SpO2: 96%   Filed Weights   11/22/19 1350  Weight: 94 lb 9.6 oz (42.9 kg)   Body mass index is 18.48 kg/m.   Exam was given in a chair   GENERAL:alert, in no acute distress and comfortable SKIN: no acute rashes, no significant lesions EYES: conjunctiva are pink and non-injected, sclera anicteric OROPHARYNX: MMM, no exudates, no oropharyngeal erythema or ulceration NECK: supple, no JVD LYMPH:  no palpable lymphadenopathy in the cervical, axillary or inguinal regions LUNGS: clear to auscultation b/l with normal respiratory effort HEART: regular rate & rhythm ABDOMEN:  normoactive bowel sounds , non tender, not distended. No palpable hepatosplenomegaly.  Extremity: no pedal edema PSYCH: alert & oriented x 3 with fluent speech NEURO: no focal motor/sensory  deficits  LABORATORY DATA:   I have reviewed the data as listed  . CBC Latest Ref Rng & Units 11/22/2019 08/24/2019 05/21/2019  WBC 4.0 - 10.5 K/uL 11.3(H) 12.1(H) 11.1(H)  Hemoglobin 12.0 - 15.0 g/dL 8.4(L) 8.1(L) 8.5(L)  Hematocrit 36.0 - 46.0 % 25.2(L) 24.3(L) 24.0(L)  Platelets 150 - 400 K/uL 515(H) 463(H) 519(H)    CMP Latest Ref Rng & Units 11/22/2019 08/24/2019 05/21/2019  Glucose 70 - 99 mg/dL 68(L) 91 91  BUN 8 - 23 mg/dL 14 11 11   Creatinine 0.44 - 1.00 mg/dL 0.69 0.63 0.69  Sodium 135 - 145 mmol/L 142 144 143  Potassium 3.5 - 5.1 mmol/L 3.8 3.8 3.7  Chloride 98 - 111 mmol/L 104 104 102  CO2 22 - 32 mmol/L 31 31 30   Calcium 8.9 - 10.3 mg/dL 8.8(L) 9.2 9.0  Total Protein 6.5 - 8.1 g/dL 6.9 7.1 7.1  Total Bilirubin 0.3 - 1.2 mg/dL 1.9(H)  3.1(H) 3.0(H)  Alkaline Phos 38 - 126 U/L 103 108 117  AST 15 - 41 U/L 16 19 21   ALT 0 - 44 U/L 9 12 11    . Lab Results  Component Value Date   LDH 251 (H) 11/22/2019          RADIOGRAPHIC STUDIES: I have personally reviewed the radiological images as listed and agreed with the findings in the report. No results found.  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 Her hemoglobin tends to be lower during winter and has been in the 6-8 range. Overall stable hemoglobin levels in the 6-7 range with folic acid replacement and cold avoidance.  #2 IgM lambda MGUS - cannot rule out possibility of lymphoplasmacytic lymphoma though less likely given chronicity of her condition. 08/28/18 MMP M spike down from 0.8 to 0.4  Flow cytometry- no clonoal B cell lymphoma   #3 History of previous B12 deficiency  #4 hypokalemia- resolved. -Potassium, begin taking BID with orange juice, and pt completed her prednisone course so I expect her potassium levels to increase  -08/28/18 Potassium at 3.6  #5. Low back pain -compression fracture L4 -MRI L spine -Acute/subacute superior endplate fracture at L4 with loss of  height of 10-20%. No retropulsed bone. This is quite likely the cause of the low back pain. This was probably present on the radiographs of 01/20/2018, but not diagnosable because of the spinal curvature and unlevel endplates. Old healed superior endplate fracture at 624THL Plan -controlled discomfort -prn Tramadol for back pain   #6 Spiculated Lung nodule in right upper lobe  First seen on 05/28/18 CTA Chest Hasn't changed over 6 month interval with repeat CT Chest in February 2020  10/26/18 CT Chest which revealed There is a spiculated solid nodule of the right upper lobe, measuring 5-6 mm and stable from prior examination. Recommend additional follow-up in 12 months to ensure ongoing stability. 2.  Emphysema. 3. Increased height loss deformities of T11 and T12.  08/13/2019 CT Chest ([id number]) revealed "4 mm posterior right upper lobe pulmonary nodule, unchanged. Dedicated follow-up imaging is not required per Fleischner Society guidelines. This recommendation follows the consensus statement Guidelines for Management of Small Pulmonary Nodules Detected on CT Images: From the Fleischner Society 2017; Radiology 2017; 284:228-243. Aortic Atherosclerosis (ICD10-I70.0) and Emphysema (ICD10-J43.9)."  #7 Vitamin D deficiency December 2019 Vitamin D at 9.6 improved to 39.8  (05/20/2020)  #8 Patient Active Problem List   Diagnosis Date Noted  . Symptomatic anemia 05/28/2018  . 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 Occult blood in stools 02/04/2018  . Ureteral calculus 11/12/2015  . Breast cancer screening, high risk patient 08/29/2015  . Acute delirium 08/07/2015  . Postoperative anemia due to acute blood loss 08/06/2015  . Protein-calorie malnutrition, severe 08/04/2015  . Closed intertrochanteric fracture of left femur ([place]) 08/03/2015  . Fall 08/03/2015  . Closed right hip fracture ([place]) 08/03/2015  . Hyperbilirubinemia 08/01/2013  .  Hypokalemia 07/30/2013  . Vitamin B 12 deficiency 08/07/2012  . Idiopathic chronic cold agglutinin disease 06/10/2012  . Tobacco abuse 06/10/2012   -Continue follow-up with primary care physician for management of other medical comorbidities  -ALL TRANSFUSION PRODUCTS SHOULD BE PRE-WARMED USING A BLOOD WARMER INCLUDING IV FLUIDS, IF ANY.   PLAN: -Discussed pt labwork today, 11/22/19; all values are WNL except for WBC at 11.3K, RBC at 2.40, Hgb at 8.4, HCT at 25.2, MCV at 105.0,  MCH at 35.0, PLT at 515K, Neutro Abs at 7.8K, Glucose at 68, Calcium at 8.8, Total Bilirubin at 1.9. - Hgb is okay for this time of year. -Discussed 11/22/2019 LDH continues to decline at 251  -No indication for PRBC transfusion or additional rx of cold agglutinin disease at this time. Continue to maintain cold avoidance and precautions. -Continue Vitamin B12 -Continue B-complex vitamin -Continue Vitamin D  -Will check Vitamin D levels with next labs  -Will see back in 5 months with labs   FOLLOW UP: RTC with Dr Irene Limbo with labs in 5 months   All of the patient's questions were answered with apparent satisfaction. The patient knows to call the clinic with any problems, questions or concerns.  Sullivan Lone MD [place] AAHIVMS [facility] Hematology/Oncology Physician [facility]  (Office):       [phone] (Work cell):  [phone] (Fax):           [phone]  I, Yevette Edwards, am acting as a scribe for Dr. Sullivan Lone.   .I have reviewed the above documentation for accuracy and completeness, and I agree with the above. Brunetta Genera MD

## 2019-11-25 ENCOUNTER — Telehealth: Payer: Self-pay | Admitting: Hematology

## 2019-11-25 NOTE — Telephone Encounter (Signed)
Scheduled per 03/01 los, patient has been called and notified. ?

## 2020-03-24 ENCOUNTER — Other Ambulatory Visit: Payer: Self-pay | Admitting: Pharmacist

## 2020-04-14 ENCOUNTER — Telehealth: Payer: Self-pay | Admitting: Hematology

## 2020-04-14 NOTE — Telephone Encounter (Signed)
Rescheduled per 08/02 to 08/18 due to scheduling change, patient has been called and notified.

## 2020-04-17 ENCOUNTER — Other Ambulatory Visit: Payer: Self-pay | Admitting: Internal Medicine

## 2020-04-17 DIAGNOSIS — Z780 Asymptomatic menopausal state: Secondary | ICD-10-CM

## 2020-04-24 ENCOUNTER — Ambulatory Visit: Payer: Medicare Other | Admitting: Hematology

## 2020-04-24 ENCOUNTER — Other Ambulatory Visit: Payer: Medicare Other

## 2020-05-10 ENCOUNTER — Inpatient Hospital Stay (HOSPITAL_BASED_OUTPATIENT_CLINIC_OR_DEPARTMENT_OTHER): Payer: Medicare Other | Admitting: Hematology

## 2020-05-10 ENCOUNTER — Inpatient Hospital Stay: Payer: Medicare Other | Attending: Hematology

## 2020-05-10 ENCOUNTER — Other Ambulatory Visit: Payer: Self-pay

## 2020-05-10 VITALS — BP 148/52 | HR 65 | Temp 98.5°F | Resp 18 | Ht 60.0 in | Wt 93.1 lb

## 2020-05-10 DIAGNOSIS — D591 Autoimmune hemolytic anemia, unspecified: Secondary | ICD-10-CM

## 2020-05-10 DIAGNOSIS — I10 Essential (primary) hypertension: Secondary | ICD-10-CM | POA: Diagnosis not present

## 2020-05-10 DIAGNOSIS — I6523 Occlusion and stenosis of bilateral carotid arteries: Secondary | ICD-10-CM | POA: Diagnosis not present

## 2020-05-10 DIAGNOSIS — I700 Atherosclerosis of aorta: Secondary | ICD-10-CM | POA: Diagnosis not present

## 2020-05-10 DIAGNOSIS — J449 Chronic obstructive pulmonary disease, unspecified: Secondary | ICD-10-CM | POA: Diagnosis not present

## 2020-05-10 DIAGNOSIS — E559 Vitamin D deficiency, unspecified: Secondary | ICD-10-CM

## 2020-05-10 DIAGNOSIS — M545 Low back pain: Secondary | ICD-10-CM | POA: Diagnosis not present

## 2020-05-10 DIAGNOSIS — Z87891 Personal history of nicotine dependence: Secondary | ICD-10-CM | POA: Diagnosis not present

## 2020-05-10 DIAGNOSIS — D5912 Cold autoimmune hemolytic anemia: Secondary | ICD-10-CM | POA: Diagnosis not present

## 2020-05-10 DIAGNOSIS — D472 Monoclonal gammopathy: Secondary | ICD-10-CM | POA: Insufficient documentation

## 2020-05-10 DIAGNOSIS — E538 Deficiency of other specified B group vitamins: Secondary | ICD-10-CM | POA: Insufficient documentation

## 2020-05-10 LAB — CMP (CANCER CENTER ONLY)
ALT: 7 U/L (ref 0–44)
AST: 17 U/L (ref 15–41)
Albumin: 4.1 g/dL (ref 3.5–5.0)
Alkaline Phosphatase: 110 U/L (ref 38–126)
Anion gap: 8 (ref 5–15)
BUN: 8 mg/dL (ref 8–23)
CO2: 32 mmol/L (ref 22–32)
Calcium: 9.5 mg/dL (ref 8.9–10.3)
Chloride: 102 mmol/L (ref 98–111)
Creatinine: 0.67 mg/dL (ref 0.44–1.00)
GFR, Est AFR Am: >60 mL/min (ref >60)
GFR, Estimated: >60 mL/min (ref >60)
Glucose, Bld: 86 mg/dL (ref 70–99)
Potassium: 3.6 mmol/L (ref 3.5–5.1)
Sodium: 142 mmol/L (ref 135–145)
Total Bilirubin: 2.4 mg/dL — ABNORMAL HIGH (ref 0.3–1.2)
Total Protein: 6.8 g/dL (ref 6.5–8.1)

## 2020-05-10 LAB — CBC WITH DIFFERENTIAL/PLATELET
Abs Immature Granulocytes: 0.05 10*3/uL (ref 0.00–0.07)
Basophils Absolute: 0.1 10*3/uL (ref 0.0–0.1)
Basophils Relative: 1 %
Eosinophils Absolute: 0.1 10*3/uL (ref 0.0–0.5)
Eosinophils Relative: 1 %
HCT: 24.1 % — ABNORMAL LOW (ref 36.0–46.0)
Hemoglobin: 8.1 g/dL — ABNORMAL LOW (ref 12.0–15.0)
Immature Granulocytes: 0 %
Lymphocytes Relative: 23 %
Lymphs Abs: 3 10*3/uL (ref 0.7–4.0)
MCH: 35.5 pg — ABNORMAL HIGH (ref 26.0–34.0)
MCHC: 33.6 g/dL (ref 30.0–36.0)
MCV: 105.7 fL — ABNORMAL HIGH (ref 80.0–100.0)
Monocytes Absolute: 0.7 10*3/uL (ref 0.1–1.0)
Monocytes Relative: 6 %
Neutro Abs: 8.9 10*3/uL — ABNORMAL HIGH (ref 1.7–7.7)
Neutrophils Relative %: 69 %
Platelets: 513 10*3/uL — ABNORMAL HIGH (ref 150–400)
RBC: 2.28 MIL/uL — ABNORMAL LOW (ref 3.87–5.11)
RDW: 14.9 % (ref 11.5–15.5)
WBC: 12.9 10*3/uL — ABNORMAL HIGH (ref 4.0–10.5)
nRBC: 0 % (ref 0.0–0.2)

## 2020-05-10 LAB — SAMPLE TO BLOOD BANK

## 2020-05-10 LAB — VITAMIN D 25 HYDROXY (VIT D DEFICIENCY, FRACTURES): Vit D, 25-Hydroxy: 31.17 ng/mL (ref 30–100)

## 2020-05-10 NOTE — Progress Notes (Signed)
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 The patient's last visit with [place] was on 11/22/2019. The pt reports that she is doing well overall.  The pt reports that she had a boil on her lower right leg which her PCP helped her drain. Pt has been cleaning and dressing the wound and believes that it is drying out. Pt has noticed bilateral lower leg swelling that began in the right leg. She elevates her legs as much as possible. Her back pain has been stable and is thought to be caused by arthritis. She has continued taking Vitamin B12, B-complex, and Vitamin D as suggested. Pt is scheduled to have a bone density study in October. Pt has been taking Donepezil to help with her memory, but does not report any significant improvement. She denies any lightheadedness or dizziness.   Lab results today (05/10/20) of CBC w/diff and CMP is as follows: all values are WNL except for WBC at 12.9K, RBC at 2.28, Hgb at 8.1, HCT at 24.1, MCV at 105.7, MCH at 35.5, PLT at 513K, Neutro Abs at 8.9K, Total Bilirubin at 2.4. 05/10/2020 Vitamin D 25 (OH) at 31.17  On review of systems, pt reports leg swelling and denies dizziness, lightheadedness, abdominal pain and any other symptoms.   REVIEW OF SYSTEMS:   A 10+ POINT REVIEW OF SYSTEMS WAS OBTAINED including neurology, dermatology, psychiatry, cardiac, respiratory, lymph, extremities, GI, GU, Musculoskeletal, constitutional, breasts, reproductive, HEENT.  All pertinent positives are noted in the HPI.  All others are negative.   Past Medical History:  Diagnosis Date  . B12 deficiency   . Bilateral carotid  artery stenosis    right CCA and ICA 1-39%/    left ICA 40-59%  per last duplex  10-19-2015  . COPD (chronic obstructive pulmonary disease) ([place])   . Diverticulosis of colon   . History of diverticulitis of colon    2004  . 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[place])    montiored by dr Marko Plume  . Renal and ureteric calculus    left    . Past Surgical History:  Procedure Laterality Date  . ABDOMINAL HYSTERECTOMY  1986  . APPENDECTOMY  1967  . BREAST EXCISIONAL BIOPSY Right 1984  . CATARACT EXTRACTION W/ INTRAOCULAR LENS IMPLANT Right 1990's   congential cataract  . CYSTOSCOPY W/ URETERAL STENT PLACEMENT  11/12/2015   Procedure: CYSTOSCOPY WITH RETROGRADE PYELOGRAM/URETERAL STENT PLACEMENT;  Surgeon: [name], MD;  [facility];  Service: Urology;;  . Consuela Mimes W/ URETERAL STENT REMOVAL Left 11/22/2015   Procedure: CYSTOSCOPY WITH STENT REMOVAL;  Surgeon: [name], MD;  Location: [facility];  Service: Urology;  Laterality: Left;  . CYSTOSCOPY/URETEROSCOPY/HOLMIUM LASER Left 11/22/2015   Procedure: CYSTOSCOPY/URETEROSCOPY/HOLMIUM LASER;  Surgeon: [name], MD;  Location: [facility];  Service: Urology;  Laterality: Left;  FLEX URETEROSCOPY  . FEMUR IM NAIL Left 08/04/2015   Procedure: INTRAMEDULLARY (IM) NAIL FEMORAL;  Surgeon: [name], MD;  Location: [place];  Service: Orthopedics;  Laterality: Left;  . IR KYPHO THORACIC WITH BONE BIOPSY  06/22/2018  . TONSILLECTOMY  age 21    . Social History  Tobacco Use  . Smoking status: Former Smoker    Packs/day: 1.00    Types: Cigarettes    Quit date: 08/03/2015    Years since quitting: 4.7  . Smokeless tobacco: Never Used  Vaping Use  . Vaping Use: Never used  Substance Use Topics  . Alcohol use: Yes    Comment: occ  . Drug use: Never    ALLERGIES:  is allergic to other and shellfish allergy.  MEDICATIONS:  Current Outpatient Medications  Medication Sig Dispense Refill   . acetaminophen (TYLENOL) 325 MG tablet Take 2 tablets (650 mg total) by mouth every 6 (six) hours as needed for mild pain (or Fever >/= 101).    . CVS VITAMIN B12 1000 MCG TBCR TAKE 1 TABLET BY MOUTH EVERY DAY 90 tablet 3  . docusate sodium (COLACE) 100 MG capsule Take 1 capsule (100 mg total) by mouth daily.    . ergocalciferol (VITAMIN D2) 1.25 MG (50000 UT) capsule Take 1 capsule (50,000 Units total) by mouth 2 (two) times a week. 12 capsule 3  . folic acid (FOLVITE) 1 MG tablet TAKE 2 TABLETS BY MOUTH EVERY DAY 180 tablet 1  . Multiple Vitamin (MULTIVITAMIN WITH MINERALS) TABS tablet Take 1 tablet by mouth daily.    Marland Kitchen senna-docusate (SENOKOT-S) 8.6-50 MG tablet Take 2 tablets by mouth 2 (two) times daily.    . Tetrahydrozoline HCl (VISINE OP) Apply 1 drop to eye daily as needed (allergies/dry eyes).    . traMADol (ULTRAM) 50 MG tablet Take 1 tablet (50 mg total) by mouth every 8 (eight) hours as needed. (Patient not taking: Reported on 08/28/2018) 20 tablet 0   No current facility-administered medications for this visit.    PHYSICAL EXAMINATION: ECOG PERFORMANCE STATUS: 1 - Symptomatic but completely ambulatory  Vitals:   05/10/20 1439  BP: (!) 148/52  Pulse: 65  Resp: 18  Temp: 98.5 F (36.9 C)  SpO2: 99%   Filed Weights   05/10/20 1439  Weight: 93 lb 1.6 oz (42.2 kg)   Body mass index is 18.18 kg/m.   Exam was given in a chair   GENERAL:alert, in no acute distress and comfortable SKIN: no acute rashes, no significant lesions EYES: conjunctiva are pink and non-injected, sclera anicteric OROPHARYNX: MMM, no exudates, no oropharyngeal erythema or ulceration NECK: supple, no JVD LYMPH:  no palpable lymphadenopathy in the cervical, axillary or inguinal regions LUNGS: clear to auscultation b/l with normal respiratory effort HEART: regular rate & rhythm ABDOMEN:  normoactive bowel sounds , non tender, not distended. No palpable hepatosplenomegaly.  Extremity: no pedal  edema PSYCH: alert & oriented x 3 with fluent speech NEURO: no focal motor/sensory deficits  LABORATORY DATA:   I have reviewed the data as listed  . CBC Latest Ref Rng & Units 05/10/2020 11/22/2019 08/24/2019  WBC 4.0 - 10.5 K/uL 12.9(H) 11.3(H) 12.1(H)  Hemoglobin 12.0 - 15.0 g/dL 8.1(L) 8.4(L) 8.1(L)  Hematocrit 36 - 46 % 24.1(L) 25.2(L) 24.3(L)  Platelets 150 - 400 K/uL 513(H) 515(H) 463(H)    CMP Latest Ref Rng & Units 05/10/2020 11/22/2019 08/24/2019  Glucose 70 - 99 mg/dL 86 68(L) 91  BUN 8 - 23 mg/dL 8 14 11   Creatinine 0.44 - 1.00 mg/dL 0.67 0.69 0.63  Sodium 135 - 145 mmol/L 142 142 144  Potassium 3.5 - 5.1 mmol/L 3.6 3.8 3.8  Chloride 98 - 111 mmol/L 102 104 104  CO2 22 - 32 mmol/L 32 31 31  Calcium 8.9 - 10.3 mg/dL 9.5  8.8(L) 9.2  Total Protein 6.5 - 8.1 g/dL 6.8 6.9 7.1  Total Bilirubin 0.3 - 1.2 mg/dL 2.4(H) 1.9(H) 3.1(H)  Alkaline Phos 38 - 126 U/L 110 103 108  AST 15 - 41 U/L 17 16 19   ALT 0 - 44 U/L 7 9 12    . Lab Results  Component Value Date   LDH 251 (H) 11/22/2019          RADIOGRAPHIC STUDIES: I have personally reviewed the radiological images as listed and agreed with the findings in the report. No results found.  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 Her hemoglobin tends to be lower during winter and has been in the 6-8 range. Overall stable hemoglobin levels in the 6-7 range with folic acid replacement and cold avoidance.  #2 IgM lambda MGUS - cannot rule out possibility of lymphoplasmacytic lymphoma though less likely given chronicity of her condition. 08/28/18 MMP M spike down from 0.8 to 0.4  Flow cytometry- no clonoal B cell lymphoma   #3 History of previous B12 deficiency  #4 hypokalemia- resolved. -Potassium, begin taking BID with orange juice, and pt completed her prednisone course so I expect her potassium levels to increase  -08/28/18 Potassium at 3.6  #5. Low back pain -compression fracture  L4 -MRI L spine -Acute/subacute superior endplate fracture at L4 with loss of height of 10-20%. No retropulsed bone. This is quite likely the cause of the low back pain. This was probably present on the radiographs of 01/20/2018, but not diagnosable because of the spinal curvature and unlevel endplates. Old healed superior endplate fracture at N05 Plan -controlled discomfort -prn Tramadol for back pain   #6 Spiculated Lung nodule in right upper lobe  First seen on 05/28/18 CTA Chest Hasn't changed over 6 month interval with repeat CT Chest in February 2020  10/26/18 CT Chest which revealed There is a spiculated solid nodule of the right upper lobe, measuring 5-6 mm and stable from prior examination. Recommend additional follow-up in 12 months to ensure ongoing stability. 2.  Emphysema. 3. Increased height loss deformities of T11 and T12.  08/13/2019 CT Chest ([id number]) revealed "4 mm posterior right upper lobe pulmonary nodule, unchanged. Dedicated follow-up imaging is not required per Fleischner Society guidelines. This recommendation follows the consensus statement Guidelines for Management of Small Pulmonary Nodules Detected on CT Images: From the Fleischner Society 2017; Radiology 2017; 284:228-243. Aortic Atherosclerosis (ICD10-I70.0) and Emphysema (ICD10-J43.9)."  #7 Vitamin D deficiency December 2019 Vitamin D at 9.6 improved to 39.8  (05/20/2020)  #8 Patient Active Problem List   Diagnosis Date Noted  . Symptomatic anemia 05/28/2018  . 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 Occult blood in stools 02/04/2018  . Ureteral calculus 11/12/2015  . Breast cancer screening, high risk patient 08/29/2015  . Acute delirium 08/07/2015  . Postoperative anemia due to acute blood loss 08/06/2015  . Protein-calorie malnutrition, severe 08/04/2015  . Closed intertrochanteric fracture of left femur ([place]) 08/03/2015  . Fall 08/03/2015  . Closed  right hip fracture ([place]) 08/03/2015  . Hyperbilirubinemia 08/01/2013  . Hypokalemia 07/30/2013  . Vitamin B 12 deficiency 08/07/2012  . Idiopathic chronic cold agglutinin disease 06/10/2012  . Tobacco abuse 06/10/2012   -Continue follow-up with primary care physician for management of other medical comorbidities  -ALL TRANSFUSION PRODUCTS SHOULD BE PRE-WARMED USING A BLOOD WARMER INCLUDING IV FLUIDS, IF ANY.   PLAN: -Discussed pt labwork today, 05/10/20; blood  counts are stable, Bilirubin is elevated, Vitamin D is still low -No indication for PRBC transfusion or additional rx of cold agglutinin disease at this time. Continue cold avoidance and precautions. -Discussed venous statis changes. Recommend pt watch dietary sodium and elevate legs as much as possible. -Advised pt that her PCP will decide if she needs any additional bone strengthening medications.  -Continue Vitamin B12, Vitamin D, and B-complex -Advised pt to f/u for bone denisty study as scheduled.  -Will get US Venous RLE -Will see back in 4 months with labs    FOLLOW UP: US venous RLE today or tomorrow to r/o DVT RTC with Dr Irene Limbo with labs in 4 months   The total time spent in the appt was 20 minutes and more than 50% was on counseling and direct patient cares.  All of the patient's questions were answered with apparent satisfaction. The patient knows to call the clinic with any problems, questions or concerns.   Sullivan Lone MD [place] AAHIVMS [facility] [place] Hematology/Oncology Physician [facility]  (Office):       [phone] (Work cell):  [phone] (Fax):           [phone]  I, Yevette Edwards, am acting as a scribe for Dr. Sullivan Lone.   .I have reviewed the above documentation for accuracy and completeness, and I agree with the above. Brunetta Genera MD   ADDENDUM  US venous b/l LE-8/19    Summary:  RIGHT:  - There is no evidence of deep vein thrombosis in the lower extremity.   However, portions of this examination were limited- see technologist  comments above.    - No cystic structure found in the popliteal fossa.    LEFT:  - No evidence of common femoral vein obstruction.

## 2020-05-11 ENCOUNTER — Telehealth: Payer: Self-pay | Admitting: *Deleted

## 2020-05-11 ENCOUNTER — Ambulatory Visit (HOSPITAL_COMMUNITY)
Admission: RE | Admit: 2020-05-11 | Discharge: 2020-05-11 | Disposition: A | Discharge status: Home / Self Care | Payer: Medicare Other | Source: Ambulatory Visit | Attending: Hematology | Admitting: Hematology

## 2020-05-11 ENCOUNTER — Other Ambulatory Visit: Payer: Self-pay | Admitting: Hematology

## 2020-05-11 DIAGNOSIS — D5912 Cold autoimmune hemolytic anemia: Secondary | ICD-10-CM | POA: Diagnosis present

## 2020-05-11 DIAGNOSIS — D591 Autoimmune hemolytic anemia, unspecified: Secondary | ICD-10-CM

## 2020-05-11 NOTE — Progress Notes (Signed)
Right lower extremity venous duplex has been completed. Preliminary results can be found in CV Proc through chart review.  Results were given to [place] at Dr. Grier Mitts office.  05/11/20 9:56 AM [name] RVT

## 2020-05-11 NOTE — Telephone Encounter (Signed)
[name] from vascular called report, pt right lower leg negative for DVT. Provider made aware

## 2020-05-11 NOTE — Telephone Encounter (Signed)
Called pt to make aware that her [place] was negative for DVT. Pt verbalized understanding.

## 2020-07-17 ENCOUNTER — Ambulatory Visit
Admission: RE | Admit: 2020-07-17 | Discharge: 2020-07-17 | Disposition: A | Discharge status: Home / Self Care | Payer: Medicare Other | Source: Ambulatory Visit | Attending: Internal Medicine | Admitting: Internal Medicine

## 2020-07-17 ENCOUNTER — Other Ambulatory Visit: Payer: Self-pay

## 2020-07-17 DIAGNOSIS — Z780 Asymptomatic menopausal state: Secondary | ICD-10-CM

## 2020-08-09 ENCOUNTER — Other Ambulatory Visit: Payer: Self-pay | Admitting: Hematology

## 2020-08-09 DIAGNOSIS — D591 Autoimmune hemolytic anemia, unspecified: Secondary | ICD-10-CM

## 2020-08-09 DIAGNOSIS — D5912 Cold autoimmune hemolytic anemia: Secondary | ICD-10-CM

## 2020-09-07 ENCOUNTER — Other Ambulatory Visit: Payer: Self-pay

## 2020-09-07 ENCOUNTER — Inpatient Hospital Stay: Payer: Medicare Other | Attending: Hematology

## 2020-09-07 ENCOUNTER — Telehealth: Payer: Self-pay | Admitting: Hematology

## 2020-09-07 ENCOUNTER — Inpatient Hospital Stay (HOSPITAL_BASED_OUTPATIENT_CLINIC_OR_DEPARTMENT_OTHER): Payer: Medicare Other | Admitting: Hematology

## 2020-09-07 VITALS — BP 147/80 | HR 69 | Temp 98.5°F | Resp 18 | Ht 60.0 in | Wt 87.8 lb

## 2020-09-07 DIAGNOSIS — E559 Vitamin D deficiency, unspecified: Secondary | ICD-10-CM

## 2020-09-07 DIAGNOSIS — D5912 Cold autoimmune hemolytic anemia: Secondary | ICD-10-CM

## 2020-09-07 DIAGNOSIS — Z87891 Personal history of nicotine dependence: Secondary | ICD-10-CM | POA: Diagnosis not present

## 2020-09-07 DIAGNOSIS — D62 Acute posthemorrhagic anemia: Secondary | ICD-10-CM | POA: Insufficient documentation

## 2020-09-07 DIAGNOSIS — E538 Deficiency of other specified B group vitamins: Secondary | ICD-10-CM | POA: Diagnosis not present

## 2020-09-07 DIAGNOSIS — I6523 Occlusion and stenosis of bilateral carotid arteries: Secondary | ICD-10-CM | POA: Insufficient documentation

## 2020-09-07 DIAGNOSIS — M545 Low back pain, unspecified: Secondary | ICD-10-CM | POA: Diagnosis not present

## 2020-09-07 DIAGNOSIS — D591 Autoimmune hemolytic anemia, unspecified: Secondary | ICD-10-CM

## 2020-09-07 DIAGNOSIS — I700 Atherosclerosis of aorta: Secondary | ICD-10-CM | POA: Insufficient documentation

## 2020-09-07 LAB — CMP (CANCER CENTER ONLY)
ALT: 11 U/L (ref 0–44)
AST: 16 U/L (ref 15–41)
Albumin: 4.2 g/dL (ref 3.5–5.0)
Alkaline Phosphatase: 112 U/L (ref 38–126)
Anion gap: 7 (ref 5–15)
BUN: 9 mg/dL (ref 8–23)
CO2: 33 mmol/L — ABNORMAL HIGH (ref 22–32)
Calcium: 8.9 mg/dL (ref 8.9–10.3)
Chloride: 104 mmol/L (ref 98–111)
Creatinine: 0.68 mg/dL (ref 0.44–1.00)
GFR, Estimated: >60 mL/min (ref >60)
Glucose, Bld: 87 mg/dL (ref 70–99)
Potassium: 3.1 mmol/L — ABNORMAL LOW (ref 3.5–5.1)
Sodium: 144 mmol/L (ref 135–145)
Total Bilirubin: 3.1 mg/dL — ABNORMAL HIGH (ref 0.3–1.2)
Total Protein: 6.9 g/dL (ref 6.5–8.1)

## 2020-09-07 LAB — CBC WITH DIFFERENTIAL/PLATELET
Abs Immature Granulocytes: 0.02 10*3/uL (ref 0.00–0.07)
Basophils Absolute: 0.1 10*3/uL (ref 0.0–0.1)
Basophils Relative: 1 %
Eosinophils Absolute: 0.1 10*3/uL (ref 0.0–0.5)
Eosinophils Relative: 1 %
HCT: 24.8 % — ABNORMAL LOW (ref 36.0–46.0)
Hemoglobin: 8.5 g/dL — ABNORMAL LOW (ref 12.0–15.0)
Immature Granulocytes: 0 %
Lymphocytes Relative: 22 %
Lymphs Abs: 2 10*3/uL (ref 0.7–4.0)
MCH: 36.8 pg — ABNORMAL HIGH (ref 26.0–34.0)
MCHC: 34.3 g/dL (ref 30.0–36.0)
MCV: 107.4 fL — ABNORMAL HIGH (ref 80.0–100.0)
Monocytes Absolute: 0.5 10*3/uL (ref 0.1–1.0)
Monocytes Relative: 6 %
Neutro Abs: 6.6 10*3/uL (ref 1.7–7.7)
Neutrophils Relative %: 70 %
Platelets: 442 10*3/uL — ABNORMAL HIGH (ref 150–400)
RBC: 2.31 MIL/uL — ABNORMAL LOW (ref 3.87–5.11)
RDW: 14.6 % (ref 11.5–15.5)
WBC: 9.3 10*3/uL (ref 4.0–10.5)
nRBC: 0 % (ref 0.0–0.2)

## 2020-09-07 LAB — VITAMIN D 25 HYDROXY (VIT D DEFICIENCY, FRACTURES): Vit D, 25-Hydroxy: 31.9 ng/mL (ref 30–100)

## 2020-09-07 LAB — LACTATE DEHYDROGENASE: LDH: 238 U/L — ABNORMAL HIGH (ref 98–192)

## 2020-09-07 MED ORDER — POTASSIUM CHLORIDE CRYS ER 20 MEQ PO TBCR: 20.0000 meq | EXTENDED_RELEASE_TABLET | Freq: Two times a day (BID) | ORAL | 0 refills | Status: DC

## 2020-09-07 NOTE — Progress Notes (Signed)
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 We are joined today by her sister. The patient's last visit with [place] was on 05/10/2020. The pt reports that she is doing well overall.  The pt reports that she has been feeling well and eating well. She has been taking Vitamin B12, B-complex, and Vitamin D as recommended. She has received her annual flu vaccine and COVID19 booster. Pt is doing her best to stay warm.   Lab results today (09/07/20) of CBC w/diff and CMP is as follows: all values are WNL except for WBC at 2.31K, Hgb at 8.5, HCT at 24.8, MCV at 107.4, MCH at 36.8, PLT at 442K, Potassium at 3.1, CO2 at 33, Total Bilirubin at 3.1. 09/07/2020 Vitamin D at 31.90 09/07/2020 LDH at 238  On review of systems, pt denies diarrhea, low appetite, bone pain, leg swelling, abdominal pain, constipation and any other symptoms.    REVIEW OF SYSTEMS:   A 10+ POINT REVIEW OF SYSTEMS WAS OBTAINED including neurology, dermatology, psychiatry, cardiac, respiratory, lymph, extremities, GI, GU, Musculoskeletal, constitutional, breasts, reproductive, HEENT.  All pertinent positives are noted in the HPI.  All others are negative.   Past Medical History:  Diagnosis Date  . B12 deficiency   . Bilateral carotid artery stenosis    right CCA and ICA 1-39%/    left ICA 40-59%  per last duplex  10-19-2015  . COPD (chronic obstructive pulmonary disease) ([place])   . Diverticulosis of colon   . History of diverticulitis of colon    2004  . 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 Renal and ureteric calculus    left     . Past Surgical History:  Procedure Laterality Date  . ABDOMINAL HYSTERECTOMY  1986  . APPENDECTOMY  1967  . BREAST EXCISIONAL BIOPSY Right 1984  . CATARACT EXTRACTION W/ INTRAOCULAR LENS IMPLANT Right 1990's   congential cataract  . CYSTOSCOPY W/ URETERAL STENT PLACEMENT  11/12/2015   Procedure: CYSTOSCOPY WITH RETROGRADE PYELOGRAM/URETERAL STENT PLACEMENT;  Surgeon: [name], MD;  Location: [facility];  Service: Urology;;  . Consuela Mimes W/ URETERAL STENT REMOVAL Left 11/22/2015   Procedure: CYSTOSCOPY WITH STENT REMOVAL;  Surgeon: [name], MD;  Location: [facility];  Service: Urology;  Laterality: Left;  . CYSTOSCOPY/URETEROSCOPY/HOLMIUM LASER Left 11/22/2015   Procedure: CYSTOSCOPY/URETEROSCOPY/HOLMIUM LASER;  Surgeon: [name], MD;  Location: [facility];  Service: Urology;  Laterality: Left;  FLEX URETEROSCOPY  . FEMUR IM NAIL Left 08/04/2015   Procedure: INTRAMEDULLARY (IM) NAIL FEMORAL;  Surgeon: [name], MD;  Location: [place];  Service: Orthopedics;  Laterality: Left;  . IR KYPHO THORACIC WITH BONE BIOPSY  06/22/2018  . TONSILLECTOMY  age 79    . Social History   Tobacco Use  . Smoking status: Former Smoker    Packs/day: 1.00    Types: Cigarettes    Quit date: 08/03/2015    Years since quitting: 5.1  . Smokeless tobacco: Never Used  Vaping Use  . Vaping Use: Never used  Substance Use Topics  . Alcohol use: Yes    Comment: occ  .  Drug use: Never    ALLERGIES:  is allergic to other and shellfish allergy.  MEDICATIONS:  Current Outpatient Medications  Medication Sig Dispense Refill  . acetaminophen (TYLENOL) 325 MG tablet Take 2 tablets (650 mg total) by mouth every 6 (six) hours as needed for mild pain (or Fever >/= 101).    . Cyanocobalamin (VITAMIN B-12) 1000 MCG SUBL TAKE 1 TABLET BY MOUTH EVERY DAY 100 tablet 3  . docusate sodium (COLACE) 100 MG capsule Take 1 capsule (100 mg total) by mouth daily.    .  ergocalciferol (VITAMIN D2) 1.25 MG (50000 UT) capsule Take 1 capsule (50,000 Units total) by mouth 2 (two) times a week. 12 capsule 3  . folic acid (FOLVITE) 1 MG tablet TAKE 2 TABLETS BY MOUTH EVERY DAY 180 tablet 1  . Multiple Vitamin (MULTIVITAMIN WITH MINERALS) TABS tablet Take 1 tablet by mouth daily.    Marland Kitchen senna-docusate (SENOKOT-S) 8.6-50 MG tablet Take 2 tablets by mouth 2 (two) times daily.    . Tetrahydrozoline HCl (VISINE OP) Apply 1 drop to eye daily as needed (allergies/dry eyes).    . traMADol (ULTRAM) 50 MG tablet Take 1 tablet (50 mg total) by mouth every 8 (eight) hours as needed. (Patient not taking: Reported on 08/28/2018) 20 tablet 0   No current facility-administered medications for this visit.    PHYSICAL EXAMINATION: ECOG PERFORMANCE STATUS: 1 - Symptomatic but completely ambulatory  There were no vitals filed for this visit. There were no vitals filed for this visit. There is no height or weight on file to calculate BMI.   Exam was given in a chair   GENERAL:alert, in no acute distress and comfortable SKIN: no acute rashes, no significant lesions EYES: conjunctiva are pink and non-injected, sclera anicteric OROPHARYNX: MMM, no exudates, no oropharyngeal erythema or ulceration NECK: supple, no JVD LYMPH:  no palpable lymphadenopathy in the cervical, axillary or inguinal regions LUNGS: clear to auscultation b/l with normal respiratory effort HEART: regular rate & rhythm ABDOMEN:  normoactive bowel sounds , non tender, not distended. No palpable hepatosplenomegaly.  Extremity: no pedal edema PSYCH: alert & oriented x 3 with fluent speech NEURO: no focal motor/sensory deficits  LABORATORY DATA:   I have reviewed the data as listed  . CBC Latest Ref Rng & Units 05/10/2020 11/22/2019 08/24/2019  WBC 4.0 - 10.5 K/uL 12.9(H) 11.3(H) 12.1(H)  Hemoglobin 12.0 - 15.0 g/dL 8.1(L) 8.4(L) 8.1(L)  Hematocrit 36.0 - 46.0 % 24.1(L) 25.2(L) 24.3(L)  Platelets 150 - 400 K/uL  513(H) 515(H) 463(H)    CMP Latest Ref Rng & Units 05/10/2020 11/22/2019 08/24/2019  Glucose 70 - 99 mg/dL 86 68(L) 91  BUN 8 - 23 mg/dL 8 14 11   Creatinine 0.44 - 1.00 mg/dL 0.67 0.69 0.63  Sodium 135 - 145 mmol/L 142 142 144  Potassium 3.5 - 5.1 mmol/L 3.6 3.8 3.8  Chloride 98 - 111 mmol/L 102 104 104  CO2 22 - 32 mmol/L 32 31 31  Calcium 8.9 - 10.3 mg/dL 9.5 8.8(L) 9.2  Total Protein 6.5 - 8.1 g/dL 6.8 6.9 7.1  Total Bilirubin 0.3 - 1.2 mg/dL 2.4(H) 1.9(H) 3.1(H)  Alkaline Phos 38 - 126 U/L 110 103 108  AST 15 - 41 U/L 17 16 19   ALT 0 - 44 U/L 7 9 12    . Lab Results  Component Value Date   LDH 251 (H) 11/22/2019          RADIOGRAPHIC STUDIES: I have personally reviewed the radiological images  as listed and agreed with the findings in the report. No results found.  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 Her hemoglobin tends to be lower during winter and has been in the 6-8 range. Overall stable hemoglobin levels in the 6-7 range with folic acid replacement and cold avoidance.  #2 IgM lambda MGUS - cannot rule out possibility of lymphoplasmacytic lymphoma though less likely given chronicity of her condition. 08/28/18 MMP M spike down from 0.8 to 0.4  Flow cytometry- no clonoal B cell lymphoma   #3 History of previous B12 deficiency  #4 hypokalemia- resolved. -Potassium, begin taking BID with orange juice, and pt completed her prednisone course so I expect her potassium levels to increase  -08/28/18 Potassium at 3.6  #5. Low back pain -compression fracture L4 -MRI L spine -Acute/subacute superior endplate fracture at L4 with loss of height of 10-20%. No retropulsed bone. This is quite likely the cause of the low back pain. This was probably present on the radiographs of 01/20/2018, but not diagnosable because of the spinal curvature and unlevel endplates. Old healed superior endplate fracture at O03 Plan -controlled discomfort -prn  Tramadol for back pain   #6 Spiculated Lung nodule in right upper lobe  First seen on 05/28/18 CTA Chest Hasn't changed over 6 month interval with repeat CT Chest in February 2020  10/26/18 CT Chest which revealed There is a spiculated solid nodule of the right upper lobe, measuring 5-6 mm and stable from prior examination. Recommend additional follow-up in 12 months to ensure ongoing stability. 2.  Emphysema. 3. Increased height loss deformities of T11 and T12.  08/13/2019 CT Chest ([id number]) revealed "4 mm posterior right upper lobe pulmonary nodule, unchanged. Dedicated follow-up imaging is not required per Fleischner Society guidelines. This recommendation follows the consensus statement Guidelines for Management of Small Pulmonary Nodules Detected on CT Images: From the Fleischner Society 2017; Radiology 2017; 284:228-243. Aortic Atherosclerosis (ICD10-I70.0) and Emphysema (ICD10-J43.9)."  #7 Vitamin D deficiency December 2019 Vitamin D at 9.6 improved to 39.8  (05/20/2020)  #8 Patient Active Problem List   Diagnosis Date Noted  . Symptomatic anemia 05/28/2018  . 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 ([place]) 02/04/2018  . Occult blood in stools 02/04/2018  . Ureteral calculus 11/12/2015  . Breast cancer screening, high risk patient 08/29/2015  . Acute delirium 08/07/2015  . Postoperative anemia due to acute blood loss 08/06/2015  . Protein-calorie malnutrition, severe 08/04/2015  . Closed intertrochanteric fracture of left femur ([place]) 08/03/2015  . Fall 08/03/2015  . Closed right hip fracture ([place]) 08/03/2015  . Hyperbilirubinemia 08/01/2013  . Hypokalemia 07/30/2013  . Vitamin B 12 deficiency 08/07/2012  . 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 Tobacco abuse 06/10/2012   -Continue follow-up with primary care physician for management of other medical comorbidities  -ALL TRANSFUSION PRODUCTS SHOULD BE  PRE-WARMED USING A BLOOD WARMER INCLUDING IV FLUIDS, IF ANY.   PLAN: -Discussed pt labwork today, 09/07/20; Hgb looks good, WBC are nml, PLT are steady, mild hypokalemia, Bilirubin is elevated, LDH continues to trend down, Vitamin D remains low-nml. -No indication for PRBC transfusion or additional rx of cold agglutinin disease at this time. Continue cold avoidance and precautions. -Recommend pt increase dietary Potassium including: bananas, orange juice, and coconut water.  -Recommend pt f/u with PCP to verify updated Pneumonia vaccines. -Continue Vitamin B12, Vitamin D, and B-complex. -Will see back in 6 months with labs  -  Rx Potassium   FOLLOW UP: RTC with Dr Irene Limbo with labs in 6 months   The total time spent in the appt was 20 minutes and more than 50% was on counseling and direct patient cares.  All of the patient's questions were answered with apparent satisfaction. The patient knows to call the clinic with any problems, questions or concerns.   Sullivan Lone MD [place] AAHIVMS [facility] Hematology/Oncology Physician [facility]  (Office):       [phone] (Work cell):  [phone] (Fax):           [phone]  I, Yevette Edwards, am acting as a scribe for Dr. Sullivan Lone.   .I have reviewed the above documentation for accuracy and completeness, and I agree with the above. Brunetta Genera MD

## 2020-09-07 NOTE — Telephone Encounter (Signed)
Scheduled appointments per 12/16 los. Spoke to patient who is aware of appointments date and times.  

## 2021-01-10 ENCOUNTER — Other Ambulatory Visit: Payer: Self-pay | Admitting: Internal Medicine

## 2021-01-10 ENCOUNTER — Ambulatory Visit
Admission: RE | Admit: 2021-01-10 | Discharge: 2021-01-10 | Disposition: A | Discharge status: Home / Self Care | Payer: Medicare Other | Source: Ambulatory Visit | Attending: Internal Medicine | Admitting: Internal Medicine

## 2021-01-10 DIAGNOSIS — R0602 Shortness of breath: Secondary | ICD-10-CM

## 2021-01-30 ENCOUNTER — Other Ambulatory Visit: Payer: Self-pay | Admitting: Internal Medicine

## 2021-01-30 ENCOUNTER — Other Ambulatory Visit: Payer: Self-pay

## 2021-01-30 ENCOUNTER — Ambulatory Visit
Admission: RE | Admit: 2021-01-30 | Discharge: 2021-01-30 | Disposition: A | Discharge status: Home / Self Care | Payer: Medicare Other | Source: Ambulatory Visit | Attending: Internal Medicine | Admitting: Internal Medicine

## 2021-01-30 DIAGNOSIS — M545 Low back pain, unspecified: Secondary | ICD-10-CM

## 2021-03-02 ENCOUNTER — Other Ambulatory Visit (HOSPITAL_COMMUNITY): Payer: Self-pay | Admitting: Internal Medicine

## 2021-03-02 DIAGNOSIS — I501 Left ventricular failure: Secondary | ICD-10-CM

## 2021-03-09 ENCOUNTER — Other Ambulatory Visit: Payer: Medicare Other

## 2021-03-09 ENCOUNTER — Ambulatory Visit: Payer: Medicare Other | Admitting: Hematology

## 2021-03-15 ENCOUNTER — Ambulatory Visit (HOSPITAL_COMMUNITY)
Admission: RE | Admit: 2021-03-15 | Discharge: 2021-03-15 | Disposition: A | Discharge status: Home / Self Care | Payer: Medicare Other | Source: Ambulatory Visit | Attending: Internal Medicine | Admitting: Internal Medicine

## 2021-03-15 ENCOUNTER — Other Ambulatory Visit: Payer: Self-pay

## 2021-03-15 DIAGNOSIS — J449 Chronic obstructive pulmonary disease, unspecified: Secondary | ICD-10-CM | POA: Insufficient documentation

## 2021-03-15 DIAGNOSIS — I313 Pericardial effusion (noninflammatory): Secondary | ICD-10-CM | POA: Insufficient documentation

## 2021-03-15 DIAGNOSIS — I501 Left ventricular failure: Secondary | ICD-10-CM | POA: Insufficient documentation

## 2021-03-15 LAB — ECHOCARDIOGRAM COMPLETE
Area-P 1/2: 3.27 cm2
S' Lateral: 2.75 cm

## 2021-03-15 NOTE — Progress Notes (Signed)
  Echocardiogram 2D Echocardiogram has been performed.  [name] 03/15/2021, 2:50 PM

## 2021-03-29 NOTE — Progress Notes (Signed)
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 We are joined today by her sister. The patient's last visit with [place] was on 09/07/2020. The pt reports that she is doing well overall.  The pt reports that she has been feeling with no issues or concerns over the last six months. She has been taking her B-Complex and B12 daily. She recently had an ECHO with her PCP around two weeks ago. They have a follow up appointment next week. She is on Lasix for around a month now, which is helping her leg swelling. She denies any vaccines recently.  Lab results today 03/30/2021 of CBC w/diff and CMP is as follows: all values are WNL except for RBC of 2.01, Hgb of 7.2, HCT of 21.6, MCV of 107.5, MCH of 35.8, RDW of 16.3, Plt of 474K, Potassium of 3.3, Calcium of 8.7, Total protein of 6.4, AST of 12, Alkaline Phosphatase of 146, Total Bilirubin of 2.9. 03/30/2021 Magnesium of 1.8.  On review of systems, pt reports leg swelling/drainage, hip pain and denies recent infection issues, lightheadedness, dizziness, fatigue, n/v/d, and any other symptoms.    REVIEW OF SYSTEMS:   10 Point review of Systems was done is negative except as noted above.  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 Past Surgical History:  Procedure Laterality Date   ABDOMINAL HYSTERECTOMY  1986   APPENDECTOMY  1967   BREAST EXCISIONAL BIOPSY Right 1984   CATARACT EXTRACTION W/ INTRAOCULAR LENS IMPLANT Right 1990's   congential cataract   CYSTOSCOPY W/ URETERAL STENT PLACEMENT  11/12/2015   Procedure: CYSTOSCOPY WITH RETROGRADE PYELOGRAM/URETERAL STENT PLACEMENT;  Surgeon: [name], MD;  Location: [facility];  Service: Urology;;   CYSTOSCOPY W/ URETERAL STENT REMOVAL Left 11/22/2015   Procedure: CYSTOSCOPY WITH STENT REMOVAL;  Surgeon: [name], MD;  Location: [facility];  Service: Urology;  Laterality: Left;   CYSTOSCOPY/URETEROSCOPY/HOLMIUM LASER Left 11/22/2015   Procedure: CYSTOSCOPY/URETEROSCOPY/HOLMIUM LASER;  Surgeon: [name], MD;  Location: [facility];  Service: Urology;  Laterality: Left;  FLEX URETEROSCOPY   FEMUR IM NAIL Left 08/04/2015   Procedure: INTRAMEDULLARY (IM) NAIL FEMORAL;  Surgeon: [name], MD;  Location: [place];  Service: Orthopedics;  Laterality: Left;   IR KYPHO THORACIC WITH BONE BIOPSY  06/22/2018   TONSILLECTOMY  age 25    . Social History   Tobacco Use   Smoking status: Former    Packs/day: 1.00    Pack years: 0.00    Types: Cigarettes    Quit date: 08/03/2015    Years since quitting: 5.6   Smokeless tobacco: Never  Vaping Use   Vaping Use: Never used  Substance Use Topics   Alcohol use: Yes    Comment: occ   Drug use: Never    ALLERGIES:  is allergic to other and shellfish allergy.  MEDICATIONS:  Current Outpatient Medications  Medication Sig Dispense Refill   acetaminophen (TYLENOL) 325 MG tablet Take 2 tablets (650 mg total) by mouth every 6 (six) hours as needed for mild pain (or Fever >/= 101).     Cyanocobalamin (VITAMIN B-12) 1000 MCG SUBL TAKE 1 TABLET BY MOUTH EVERY DAY 100 tablet 3   docusate sodium (COLACE) 100 MG capsule Take 1 capsule (100 mg total) by mouth daily.      donepezil (ARICEPT) 5 MG tablet Take 5 mg by mouth 2 (two) times daily.     ergocalciferol (VITAMIN D2) 1.25 MG (50000 UT) capsule Take 1 capsule (50,000 Units total) by mouth 2 (two) times a week. 12 capsule 3   folic acid (FOLVITE) 1 MG tablet TAKE 2 TABLETS BY MOUTH EVERY DAY 180 tablet 1   Multiple Vitamin (MULTIVITAMIN WITH MINERALS) TABS tablet Take 1 tablet by mouth daily.     potassium chloride SA (KLOR-CON) 20 MEQ tablet Take 1 tablet (20 mEq total) by mouth 2 (two) times daily. 30 tablet 0   senna-docusate (SENOKOT-S) 8.6-50 MG tablet Take 2 tablets by mouth 2 (two) times daily.     Tetrahydrozoline HCl (VISINE OP) Apply 1 drop to eye daily as needed (allergies/dry eyes). (Patient not taking: Reported on 03/30/2021)     traMADol (ULTRAM) 50 MG tablet Take 1 tablet (50 mg total) by mouth every 8 (eight) hours as needed. (Patient not taking: No sig reported) 20 tablet 0   No current facility-administered medications for this visit.    PHYSICAL EXAMINATION: ECOG PERFORMANCE STATUS: 1 - Symptomatic but completely ambulatory  Vitals:   03/30/21 1053  BP: (!) 145/36  Pulse: (!) 56  Resp: 18  Temp: 98.2 F (36.8 C)  SpO2: 99%   Filed Weights   03/30/21 1053  Weight: 89 lb 1.6 oz (40.4 kg)   Body mass index is 17.4 kg/m.   Exam was given in a chair.   GENERAL:alert, in no acute distress and comfortable SKIN: no acute rashes, no significant lesions EYES: conjunctiva are pink and non-injected, sclera anicteric OROPHARYNX: MMM, no exudates, no oropharyngeal erythema or ulceration NECK: supple, no JVD LYMPH:  no palpable lymphadenopathy in the cervical, axillary or inguinal regions LUNGS: clear to auscultation b/l with normal respiratory effort HEART: regular rate & rhythm ABDOMEN:  normoactive bowel sounds , non tender, not distended. No palpable hepatosplenomegaly.  Extremity: no pedal edema PSYCH: alert & oriented x 3 with fluent speech NEURO: no focal motor/sensory  deficits  LABORATORY DATA:   I have reviewed the data as listed  . CBC Latest Ref Rng & Units 03/30/2021 09/07/2020 05/10/2020  WBC 4.0 - 10.5 K/uL 9.7 9.3 12.9(H)  Hemoglobin 12.0 - 15.0 g/dL 7.2(L) 8.5(L) 8.1(L)  Hematocrit 36.0 - 46.0 % 21.6(L) 24.8(L) 24.1(L)  Platelets 150 - 400 K/uL 474(H) 442(H) 513(H)    CMP Latest Ref Rng & Units 03/30/2021 09/07/2020 05/10/2020  Glucose 70 - 99 mg/dL 76 87 86  BUN 8 - 23 mg/dL 8 9 8   Creatinine 0.44 - 1.00 mg/dL 0.57 0.68 0.67  Sodium 135 - 145 mmol/L 144 144 142  Potassium 3.5 - 5.1 mmol/L 3.3(L) 3.1(L) 3.6  Chloride 98 - 111 mmol/L 104 104 102  CO2 22 - 32 mmol/L 32 33(H) 32  Calcium 8.9 - 10.3  mg/dL 8.7(L) 8.9 9.5  Total Protein 6.5 - 8.1 g/dL 6.4(L) 6.9 6.8  Total Bilirubin 0.3 - 1.2 mg/dL 2.9(H) 3.1(H) 2.4(H)  Alkaline Phos 38 - 126 U/L 146(H) 112 110  AST 15 - 41 U/L 12(L) 16 17  ALT 0 - 44 U/L 7 11 7    . Lab Results  Component Value Date   LDH 238 (H) 09/07/2020          RADIOGRAPHIC STUDIES: I have personally reviewed the radiological images as listed and agreed with the findings in the report. ECHOCARDIOGRAM COMPLETE  Result Date: 03/15/2021    ECHOCARDIOGRAM REPORT   Patient Name:   [name] Date of Exam: 03/15/2021 Medical Rec #:  [id number]            Height:       60.0 in Accession #:    [id number]           Weight:       87.8 lb Date of Birth:  [birth year]           BSA:          1.316 m Patient Age:    80 years             BP:           149/62 mmHg Patient Gender: F                    HR:           60 bpm. Exam Location:  Outpatient Procedure: 2D Echo, Cardiac Doppler and Color Doppler Indications:    Left heart failure ([place]) [428.1.ICD-9-CM]  History:        Patient has no prior history of Echocardiogram examinations.                 COPD.  Sonographer:    Bernadene Person RDCS Referring Phys: [place]  1. Abnormal septal motion inferior basal hypokinesis . Left ventricular ejection  fraction, by estimation, is 45 to 50%. The left ventricle has mildly decreased function. The left ventricle demonstrates regional wall motion abnormalities (see scoring diagram/findings for description). The left ventricular internal cavity size was mildly dilated. Left ventricular diastolic parameters were normal.  2. Right ventricular systolic function is normal. The right ventricular size is normal. There is normal pulmonary artery systolic pressure.  3. Left atrial size was mildly dilated.  4. Right atrial size was moderately dilated.  5. A small pericardial effusion is present. The pericardial effusion is posterior to the left ventricle and lateral to the left ventricle.  6. The mitral valve is normal in structure. Trivial mitral valve regurgitation. No evidence of mitral stenosis.  7. The aortic valve is tricuspid. There is mild calcification of the aortic valve. Aortic valve regurgitation is not visualized. Mild aortic valve sclerosis is present, with no evidence of aortic valve stenosis.  8. The inferior vena cava is dilated in size with >50% respiratory variability, suggesting right atrial pressure of 8 mmHg. FINDINGS  Left Ventricle: Abnormal septal motion inferior basal hypokinesis. Left ventricular ejection fraction, by estimation, is 45 to 50%. The left ventricle has mildly decreased function. The left ventricle demonstrates regional wall motion abnormalities. The  left ventricular internal cavity size was mildly dilated. There is no left ventricular hypertrophy. Left ventricular diastolic parameters were normal. Right Ventricle: The right ventricular size is normal. No increase in right ventricular wall thickness. Right ventricular systolic function is normal. There is  normal pulmonary artery systolic pressure. The tricuspid regurgitant velocity is 2.37 m/s, and  with an assumed right atrial pressure of 3 mmHg, the estimated right ventricular systolic pressure is 63.8 mmHg. Left Atrium: Left atrial  size was mildly dilated. Right Atrium: Right atrial size was moderately dilated. Pericardium: A small pericardial effusion is present. The pericardial effusion is posterior to the left ventricle and lateral to the left ventricle. Mitral Valve: The mitral valve is normal in structure. There is mild thickening of the mitral valve leaflet(s). Trivial mitral valve regurgitation. No evidence of mitral valve stenosis. Tricuspid Valve: The tricuspid valve is normal in structure. Tricuspid valve regurgitation is mild . No evidence of tricuspid stenosis. Aortic Valve: The aortic valve is tricuspid. There is mild calcification of the aortic valve. Aortic valve regurgitation is not visualized. Mild aortic valve sclerosis is present, with no evidence of aortic valve stenosis. Pulmonic Valve: The pulmonic valve was normal in structure. Pulmonic valve regurgitation is not visualized. No evidence of pulmonic stenosis. Aorta: The aortic root is normal in size and structure. Venous: The inferior vena cava is dilated in size with greater than 50% respiratory variability, suggesting right atrial pressure of 8 mmHg. IAS/Shunts: No atrial level shunt detected by color flow Doppler.  LEFT VENTRICLE PLAX 2D LVIDd:         4.20 cm  Diastology LVIDs:         2.75 cm  LV e' medial:    8.76 cm/s LV PW:         2.20 cm  LV E/e' medial:  10.2 LV IVS:        0.90 cm  LV e' lateral:   10.90 cm/s LVOT diam:     1.70 cm  LV E/e' lateral: 8.2 LV SV:         59 LV SV Index:   45 LVOT Area:     2.27 cm  RIGHT VENTRICLE RV S prime:     19.30 cm/s TAPSE (M-mode): 1.6 cm LEFT ATRIUM             Index       RIGHT ATRIUM           Index LA diam:        3.90 cm 2.96 cm/m  RA Area:     13.20 cm LA Vol (A2C):   51.4 ml 39.07 ml/m RA Volume:   32.40 ml  24.63 ml/m LA Vol (A4C):   47.5 ml 36.11 ml/m LA Biplane Vol: 54.7 ml 41.58 ml/m  AORTIC VALVE LVOT Vmax:   106.00 cm/s LVOT Vmean:  76.500 cm/s LVOT VTI:    0.260 m  AORTA Ao Root diam: 2.90 cm MITRAL  VALVE               TRICUSPID VALVE MV Area (PHT): 3.27 cm    TR Peak grad:   22.5 mmHg MV Decel Time: 232 msec    TR Vmax:        237.00 cm/s MV E velocity: 89.70 cm/s MV A velocity: 81.50 cm/s  SHUNTS MV E/A ratio:  1.10        Systemic VTI:  0.26 m                            Systemic Diam: 1.70 cm Jenkins Rouge MD Electronically signed by Jenkins Rouge MD Signature Date/Time: 03/15/2021/4:00:17 PM    Final     ASSESSMENT & PLAN:   #  1: Cold Agglutinin related chronic hemolytic anemia. Her hemoglobin tends to be lower during winter and has been in the 6-8 range. Overall stable hemoglobin levels in the 6-7 range with folic acid replacement and cold avoidance.  #2 IgM lambda MGUS - cannot rule out possibility of lymphoplasmacytic lymphoma though less likely given chronicity of her condition. 08/28/18 MMP M spike down from 0.8 to 0.4  Flow cytometry- no clonoal B cell lymphoma   #3 History of previous B12 deficiency  #4 hypokalemia- resolved. -Potassium, begin taking BID with orange juice, and pt completed her prednisone course so I expect her potassium levels to increase  -08/28/18 Potassium at 3.6  #5. Low back pain -compression fracture L4 -MRI L spine -Acute/subacute superior endplate fracture at L4 with loss of height of 10-20%. No retropulsed bone. This is quite likely the cause of the low back pain. This was probably present on the radiographs of 01/20/2018, but not diagnosable because of the spinal curvature and unlevel endplates. Old healed superior endplate fracture at H85 Plan -controlled discomfort -prn Tramadol for back pain   #6 Spiculated Lung nodule in right upper lobe  First seen on 05/28/18 CTA Chest Hasn't changed over 6 month interval with repeat CT Chest in February 2020  10/26/18 CT Chest which revealed There is a spiculated solid nodule of the right upper lobe, measuring 5-6 mm and stable from prior examination. Recommend additional follow-up in 12 months to ensure ongoing  stability. 2.  Emphysema. 3. Increased height loss deformities of T11 and T12.  08/13/2019 CT Chest ([id number]) revealed "4 mm posterior right upper lobe pulmonary nodule, unchanged. Dedicated follow-up imaging is not required per Fleischner Society guidelines. This recommendation follows the consensus statement Guidelines for Management of Small Pulmonary Nodules Detected on CT Images: From the Fleischner Society 2017; Radiology 2017; 284:228-243. 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 ([place]) 02/04/2018   Occult blood in stools 02/04/2018   Ureteral calculus 11/12/2015   Breast cancer screening, high risk patient 08/29/2015   Acute delirium 08/07/2015   Postoperative anemia due to acute blood loss 08/06/2015   Protein-calorie malnutrition, severe 08/04/2015   Closed intertrochanteric fracture of left femur ([place]) 08/03/2015   Fall 08/03/2015   Closed right hip fracture ([place]) 08/03/2015   Hyperbilirubinemia 08/01/2013   Hypokalemia 07/30/2013   Vitamin B 12 deficiency 08/07/2012   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   PLAN: -Discussed pt labwork today, 03/30/2021; Hgb has decreased and is at 7.2, magnesium normal, chemistries stable. -Advised pt that anemia can add to the stress on the heart given ECHO results and we would prefer to keep Hgb above 8. Would recommend a blood transfusion at this time. -Recommended pt keep legs elevate and wear ace wrap with some  cotton if leaking. Can use compression socks if no drainage. -Advised pt to discuss wound care at home with PCP to wrap her leg. -Advised pt that Dr. Mancel Bale needs to clear her prior to dental tooth removal from a heart standpoint. -Recommend pt increase dietary  Potassium including: bananas, orange juice, and coconut water.  -Continue Vitamin B12, Vitamin D, and B-complex. -Will see back in 2 months with labs.   FOLLOW UP: PRBC transfusion x 1 unit ASAP RTC with Dr Irene Limbo with labs in 2 months   The total time spent in the appt was 30 minutes and more than 50% was on counseling and direct patient cares.  All of the patient's questions were answered with apparent satisfaction. The patient knows to call the clinic with any problems, questions or concerns.   Sullivan Lone MD [place] AAHIVMS [facility] Hematology/Oncology Physician [facility]  (Office):       [phone] (Work cell):  [phone] (Fax):           [phone]  I, Reinaldo Raddle, am acting as scribe for Dr. Sullivan Lone, MD.    .I have reviewed the above documentation for accuracy and completeness, and I agree with the above. Brunetta Genera MD

## 2021-03-30 ENCOUNTER — Inpatient Hospital Stay: Payer: Medicare Other

## 2021-03-30 ENCOUNTER — Inpatient Hospital Stay: Payer: Medicare Other | Attending: Hematology

## 2021-03-30 ENCOUNTER — Other Ambulatory Visit: Payer: Self-pay

## 2021-03-30 ENCOUNTER — Inpatient Hospital Stay (HOSPITAL_BASED_OUTPATIENT_CLINIC_OR_DEPARTMENT_OTHER): Payer: Medicare Other | Admitting: Hematology

## 2021-03-30 VITALS — BP 145/36 | HR 56 | Temp 98.2°F | Resp 18 | Wt 89.1 lb

## 2021-03-30 DIAGNOSIS — M7989 Other specified soft tissue disorders: Secondary | ICD-10-CM | POA: Insufficient documentation

## 2021-03-30 DIAGNOSIS — Z87891 Personal history of nicotine dependence: Secondary | ICD-10-CM | POA: Insufficient documentation

## 2021-03-30 DIAGNOSIS — E559 Vitamin D deficiency, unspecified: Secondary | ICD-10-CM | POA: Diagnosis not present

## 2021-03-30 DIAGNOSIS — D5912 Cold autoimmune hemolytic anemia: Secondary | ICD-10-CM | POA: Insufficient documentation

## 2021-03-30 DIAGNOSIS — D591 Autoimmune hemolytic anemia, unspecified: Secondary | ICD-10-CM

## 2021-03-30 DIAGNOSIS — E538 Deficiency of other specified B group vitamins: Secondary | ICD-10-CM | POA: Diagnosis not present

## 2021-03-30 DIAGNOSIS — I700 Atherosclerosis of aorta: Secondary | ICD-10-CM | POA: Insufficient documentation

## 2021-03-30 DIAGNOSIS — Z79899 Other long term (current) drug therapy: Secondary | ICD-10-CM | POA: Diagnosis not present

## 2021-03-30 DIAGNOSIS — J439 Emphysema, unspecified: Secondary | ICD-10-CM | POA: Diagnosis not present

## 2021-03-30 DIAGNOSIS — D589 Hereditary hemolytic anemia, unspecified: Secondary | ICD-10-CM | POA: Diagnosis present

## 2021-03-30 DIAGNOSIS — D649 Anemia, unspecified: Secondary | ICD-10-CM | POA: Diagnosis not present

## 2021-03-30 LAB — PREPARE RBC (CROSSMATCH)

## 2021-03-30 LAB — CBC WITH DIFFERENTIAL (CANCER CENTER ONLY)
Abs Immature Granulocytes: 0.03 10*3/uL (ref 0.00–0.07)
Basophils Absolute: 0.1 10*3/uL (ref 0.0–0.1)
Basophils Relative: 1 %
Eosinophils Absolute: 0.2 10*3/uL (ref 0.0–0.5)
Eosinophils Relative: 2 %
HCT: 21.6 % — ABNORMAL LOW (ref 36.0–46.0)
Hemoglobin: 7.5 g/dL — ABNORMAL LOW (ref 12.0–15.0)
Immature Granulocytes: 0 %
Lymphocytes Relative: 20 %
Lymphs Abs: 2.5 10*3/uL (ref 0.7–4.0)
MCH: 38.1 pg — ABNORMAL HIGH (ref 26.0–34.0)
MCHC: 34.7 g/dL (ref 30.0–36.0)
MCV: 109.6 fL — ABNORMAL HIGH (ref 80.0–100.0)
Monocytes Absolute: 0.6 10*3/uL (ref 0.1–1.0)
Monocytes Relative: 5 %
Neutro Abs: 9.2 10*3/uL — ABNORMAL HIGH (ref 1.7–7.7)
Neutrophils Relative %: 72 %
Platelet Count: 423 10*3/uL — ABNORMAL HIGH (ref 150–400)
RBC: 1.97 MIL/uL — ABNORMAL LOW (ref 3.87–5.11)
RDW: 18.6 % — ABNORMAL HIGH (ref 11.5–15.5)
WBC Count: 12.6 10*3/uL — ABNORMAL HIGH (ref 4.0–10.5)
nRBC: 0 % (ref 0.0–0.2)

## 2021-03-30 LAB — CBC WITH DIFFERENTIAL/PLATELET
Abs Immature Granulocytes: 0.02 10*3/uL (ref 0.00–0.07)
Basophils Absolute: 0.1 10*3/uL (ref 0.0–0.1)
Basophils Relative: 1 %
Eosinophils Absolute: 0.2 10*3/uL (ref 0.0–0.5)
Eosinophils Relative: 2 %
HCT: 21.6 % — ABNORMAL LOW (ref 36.0–46.0)
Hemoglobin: 7.2 g/dL — ABNORMAL LOW (ref 12.0–15.0)
Immature Granulocytes: 0 %
Lymphocytes Relative: 22 %
Lymphs Abs: 2.1 10*3/uL (ref 0.7–4.0)
MCH: 35.8 pg — ABNORMAL HIGH (ref 26.0–34.0)
MCHC: 33.3 g/dL (ref 30.0–36.0)
MCV: 107.5 fL — ABNORMAL HIGH (ref 80.0–100.0)
Monocytes Absolute: 0.7 10*3/uL (ref 0.1–1.0)
Monocytes Relative: 7 %
Neutro Abs: 6.6 10*3/uL (ref 1.7–7.7)
Neutrophils Relative %: 68 %
Platelets: 474 10*3/uL — ABNORMAL HIGH (ref 150–400)
RBC: 2.01 MIL/uL — ABNORMAL LOW (ref 3.87–5.11)
RDW: 16.3 % — ABNORMAL HIGH (ref 11.5–15.5)
WBC: 9.7 10*3/uL (ref 4.0–10.5)
nRBC: 0 % (ref 0.0–0.2)

## 2021-03-30 LAB — CMP (CANCER CENTER ONLY)
ALT: 7 U/L (ref 0–44)
AST: 12 U/L — ABNORMAL LOW (ref 15–41)
Albumin: 3.8 g/dL (ref 3.5–5.0)
Alkaline Phosphatase: 146 U/L — ABNORMAL HIGH (ref 38–126)
Anion gap: 8 (ref 5–15)
BUN: 8 mg/dL (ref 8–23)
CO2: 32 mmol/L (ref 22–32)
Calcium: 8.7 mg/dL — ABNORMAL LOW (ref 8.9–10.3)
Chloride: 104 mmol/L (ref 98–111)
Creatinine: 0.57 mg/dL (ref 0.44–1.00)
GFR, Estimated: >60 mL/min (ref >60)
Glucose, Bld: 76 mg/dL (ref 70–99)
Potassium: 3.3 mmol/L — ABNORMAL LOW (ref 3.5–5.1)
Sodium: 144 mmol/L (ref 135–145)
Total Bilirubin: 2.9 mg/dL — ABNORMAL HIGH (ref 0.3–1.2)
Total Protein: 6.4 g/dL — ABNORMAL LOW (ref 6.5–8.1)

## 2021-03-30 LAB — SAMPLE TO BLOOD BANK

## 2021-03-30 LAB — MAGNESIUM: Magnesium: 1.8 mg/dL (ref 1.7–2.4)

## 2021-03-31 ENCOUNTER — Encounter: Payer: Self-pay | Admitting: *Deleted

## 2021-03-31 ENCOUNTER — Other Ambulatory Visit: Payer: Self-pay

## 2021-03-31 ENCOUNTER — Inpatient Hospital Stay: Payer: Medicare Other

## 2021-03-31 DIAGNOSIS — D5912 Cold autoimmune hemolytic anemia: Secondary | ICD-10-CM | POA: Diagnosis not present

## 2021-03-31 LAB — PREPARE RBC (CROSSMATCH)

## 2021-03-31 MED ORDER — SODIUM CHLORIDE 0.9% IV SOLUTION
250.0000 mL | Freq: Once | INTRAVENOUS | Status: DC
  Filled 2021-03-31: qty 250

## 2021-03-31 MED ORDER — METHYLPREDNISOLONE SODIUM SUCC 40 MG IJ SOLR
40.0000 mg | Freq: Once | INTRAMUSCULAR | Status: DC
Start: 2021-03-31 — End: 2021-08-09

## 2021-03-31 MED ORDER — ACETAMINOPHEN 325 MG PO TABS
ORAL_TABLET | ORAL | Status: AC
  Filled 2021-03-31: qty 2

## 2021-03-31 MED ORDER — METHYLPREDNISOLONE SODIUM SUCC 40 MG IJ SOLR
INTRAMUSCULAR | Status: AC
  Filled 2021-03-31: qty 1

## 2021-03-31 MED ORDER — ACETAMINOPHEN 325 MG PO TABS: 650.0000 mg | ORAL_TABLET | Freq: Once | ORAL | Status: DC

## 2021-03-31 NOTE — Progress Notes (Signed)
Pt presented to [facility] for blood transfusion today without blood bank bracelet.  This RN contacted blood bank to see if they could process a new T/S today.  Per BB, T/S would take 8 hrs to process.  T/S redrawn, pt rescheduled for transfusion on Monday.  Explained importance of keeping blue BB bracelet on for Monday.  Pt verbalized understanding.  Message sent to Dr Irene Limbo.

## 2021-04-01 LAB — BPAM RBC
Blood Product Expiration Date: 202208112359
Unit Type and Rh: 5100

## 2021-04-01 LAB — TYPE AND SCREEN
ABO/RH(D): B POS
Antibody Screen: POSITIVE
Unit division: 0

## 2021-04-02 ENCOUNTER — Other Ambulatory Visit: Payer: Self-pay

## 2021-04-02 ENCOUNTER — Inpatient Hospital Stay: Payer: Medicare Other

## 2021-04-02 DIAGNOSIS — D5912 Cold autoimmune hemolytic anemia: Secondary | ICD-10-CM | POA: Diagnosis not present

## 2021-04-02 DIAGNOSIS — D591 Autoimmune hemolytic anemia, unspecified: Secondary | ICD-10-CM

## 2021-04-02 MED ORDER — ACETAMINOPHEN 325 MG PO TABS
ORAL_TABLET | ORAL | Status: AC
  Filled 2021-04-02: qty 2

## 2021-04-02 MED ORDER — ACETAMINOPHEN 325 MG PO TABS
650.0000 mg | ORAL_TABLET | Freq: Once | ORAL | Status: AC
Start: 2021-04-02 — End: 2021-04-02
  Administered 2021-04-02: 650 mg via ORAL

## 2021-04-02 MED ORDER — SODIUM CHLORIDE 0.9% IV SOLUTION
250.0000 mL | Freq: Once | INTRAVENOUS | Status: AC
Start: 2021-04-02 — End: 2021-04-02
  Administered 2021-04-02: 250 mL via INTRAVENOUS
  Filled 2021-04-02: qty 250

## 2021-04-02 MED ORDER — METHYLPREDNISOLONE SODIUM SUCC 40 MG IJ SOLR
40.0000 mg | Freq: Once | INTRAMUSCULAR | Status: AC
  Administered 2021-04-02: 40 mg via INTRAVENOUS

## 2021-04-02 MED ORDER — METHYLPREDNISOLONE SODIUM SUCC 40 MG IJ SOLR
INTRAMUSCULAR | Status: AC
  Filled 2021-04-02: qty 1

## 2021-04-02 NOTE — Patient Instructions (Signed)
Blood Transfusion, Adult, Care After This sheet gives you information about how to care for yourself after your procedure. Your doctor may also give you more specific instructions. If youhave problems or questions, contact your doctor. What can I expect after the procedure? After the procedure, it is common to have: Bruising and soreness at the IV site. A fever or chills on the day of the procedure. This may be your body's response to the new blood cells received. A headache. Follow these instructions at home: Insertion site care     Follow instructions from your doctor about how to take care of your insertion site. This is where an IV tube was put into your vein. Make sure you: Wash your hands with soap and water before and after you change your bandage (dressing). If you cannot use soap and water, use hand sanitizer. Change your bandage as told by your doctor. Check your insertion site every day for signs of infection. Check for: Redness, swelling, or pain. Bleeding from the site. Warmth. Pus or a bad smell. General instructions Take over-the-counter and prescription medicines only as told by your doctor. Rest as told by your doctor. Go back to your normal activities as told by your doctor. Keep all follow-up visits as told by your doctor. This is important. Contact a doctor if: You have itching or red, swollen areas of skin (hives). You feel worried or nervous (anxious). You feel weak after doing your normal activities. You have redness, swelling, warmth, or pain around the insertion site. You have blood coming from the insertion site, and the blood does not stop with pressure. You have pus or a bad smell coming from the insertion site. Get help right away if: You have signs of a serious reaction. This may be coming from an allergy or the body's defense system (immune system). Signs include: Trouble breathing or shortness of breath. Swelling of the face or feeling warm  (flushed). Fever or chills. Head, chest, or back pain. Dark pee (urine) or blood in the pee. Widespread rash. Fast heartbeat. Feeling dizzy or light-headed. You may receive your blood transfusion in an outpatient setting. If so, youwill be told whom to contact to report any reactions. These symptoms may be an emergency. Do not wait to see if the symptoms will go away. Get medical help right away. Call your local emergency services (911 in the [place] Do not drive yourself to the hospital. Summary Bruising and soreness at the IV site are common. Check your insertion site every day for signs of infection. Rest as told by your doctor. Go back to your normal activities as told by your doctor. Get help right away if you have signs of a serious reaction. This information is not intended to replace advice given to you by your health care provider. Make sure you discuss any questions you have with your healthcare provider. Document Revised: 03/04/2019 Document Reviewed: 03/04/2019 Elsevier Patient Education  2022 [employer]  

## 2021-04-03 LAB — TYPE AND SCREEN
ABO/RH(D): B POS
Antibody Screen: POSITIVE
Unit division: 0

## 2021-04-03 LAB — BPAM RBC
Blood Product Expiration Date: 202208112359
ISSUE DATE / TIME: 202207111402
Unit Type and Rh: 5100

## 2021-04-04 ENCOUNTER — Telehealth: Payer: Self-pay | Admitting: Hematology

## 2021-04-04 NOTE — Telephone Encounter (Signed)
Scheduled follow-up appointment per 7/8 los. Patient's sister is aware.

## 2021-05-30 ENCOUNTER — Other Ambulatory Visit: Payer: Self-pay

## 2021-05-30 DIAGNOSIS — D591 Autoimmune hemolytic anemia, unspecified: Secondary | ICD-10-CM

## 2021-05-31 ENCOUNTER — Other Ambulatory Visit: Payer: Self-pay

## 2021-05-31 ENCOUNTER — Inpatient Hospital Stay: Payer: Medicare Other | Attending: Hematology | Admitting: Hematology

## 2021-05-31 ENCOUNTER — Inpatient Hospital Stay: Payer: Medicare Other

## 2021-05-31 VITALS — BP 116/43 | HR 54 | Temp 98.6°F | Resp 18 | Wt 83.2 lb

## 2021-05-31 DIAGNOSIS — R918 Other nonspecific abnormal finding of lung field: Secondary | ICD-10-CM | POA: Diagnosis not present

## 2021-05-31 DIAGNOSIS — I6523 Occlusion and stenosis of bilateral carotid arteries: Secondary | ICD-10-CM | POA: Insufficient documentation

## 2021-05-31 DIAGNOSIS — D5912 Cold autoimmune hemolytic anemia: Secondary | ICD-10-CM | POA: Diagnosis present

## 2021-05-31 DIAGNOSIS — I700 Atherosclerosis of aorta: Secondary | ICD-10-CM | POA: Insufficient documentation

## 2021-05-31 DIAGNOSIS — D589 Hereditary hemolytic anemia, unspecified: Secondary | ICD-10-CM | POA: Insufficient documentation

## 2021-05-31 DIAGNOSIS — E538 Deficiency of other specified B group vitamins: Secondary | ICD-10-CM

## 2021-05-31 DIAGNOSIS — D591 Autoimmune hemolytic anemia, unspecified: Secondary | ICD-10-CM

## 2021-05-31 DIAGNOSIS — E559 Vitamin D deficiency, unspecified: Secondary | ICD-10-CM | POA: Diagnosis not present

## 2021-05-31 DIAGNOSIS — Z87891 Personal history of nicotine dependence: Secondary | ICD-10-CM | POA: Diagnosis not present

## 2021-05-31 DIAGNOSIS — L989 Disorder of the skin and subcutaneous tissue, unspecified: Secondary | ICD-10-CM

## 2021-05-31 DIAGNOSIS — Z79899 Other long term (current) drug therapy: Secondary | ICD-10-CM | POA: Diagnosis not present

## 2021-05-31 DIAGNOSIS — L988 Other specified disorders of the skin and subcutaneous tissue: Secondary | ICD-10-CM | POA: Insufficient documentation

## 2021-05-31 DIAGNOSIS — N202 Calculus of kidney with calculus of ureter: Secondary | ICD-10-CM | POA: Diagnosis not present

## 2021-05-31 DIAGNOSIS — J439 Emphysema, unspecified: Secondary | ICD-10-CM | POA: Insufficient documentation

## 2021-05-31 LAB — CBC WITH DIFFERENTIAL (CANCER CENTER ONLY)
Abs Immature Granulocytes: 0.02 10*3/uL (ref 0.00–0.07)
Basophils Absolute: 0.1 10*3/uL (ref 0.0–0.1)
Basophils Relative: 1 %
Eosinophils Absolute: 0.2 10*3/uL (ref 0.0–0.5)
Eosinophils Relative: 2 %
HCT: 24.4 % — ABNORMAL LOW (ref 36.0–46.0)
Hemoglobin: 8.4 g/dL — ABNORMAL LOW (ref 12.0–15.0)
Immature Granulocytes: 0 %
Lymphocytes Relative: 24 %
Lymphs Abs: 2.2 10*3/uL (ref 0.7–4.0)
MCH: 35.9 pg — ABNORMAL HIGH (ref 26.0–34.0)
MCHC: 34.4 g/dL (ref 30.0–36.0)
MCV: 104.3 fL — ABNORMAL HIGH (ref 80.0–100.0)
Monocytes Absolute: 0.6 10*3/uL (ref 0.1–1.0)
Monocytes Relative: 6 %
Neutro Abs: 6.3 10*3/uL (ref 1.7–7.7)
Neutrophils Relative %: 67 %
Platelet Count: 534 10*3/uL — ABNORMAL HIGH (ref 150–400)
RBC: 2.34 MIL/uL — ABNORMAL LOW (ref 3.87–5.11)
RDW: 15.6 % — ABNORMAL HIGH (ref 11.5–15.5)
WBC Count: 9.4 10*3/uL (ref 4.0–10.5)
nRBC: 0 % (ref 0.0–0.2)

## 2021-05-31 LAB — SAMPLE TO BLOOD BANK

## 2021-05-31 LAB — CMP (CANCER CENTER ONLY)
ALT: 9 U/L (ref 0–44)
AST: 15 U/L (ref 15–41)
Albumin: 4.3 g/dL (ref 3.5–5.0)
Alkaline Phosphatase: 131 U/L — ABNORMAL HIGH (ref 38–126)
Anion gap: 10 (ref 5–15)
BUN: 13 mg/dL (ref 8–23)
CO2: 31 mmol/L (ref 22–32)
Calcium: 9.3 mg/dL (ref 8.9–10.3)
Chloride: 102 mmol/L (ref 98–111)
Creatinine: 0.68 mg/dL (ref 0.44–1.00)
GFR, Estimated: >60 mL/min (ref >60)
Glucose, Bld: 88 mg/dL (ref 70–99)
Potassium: 4.6 mmol/L (ref 3.5–5.1)
Sodium: 143 mmol/L (ref 135–145)
Total Bilirubin: 2.7 mg/dL — ABNORMAL HIGH (ref 0.3–1.2)
Total Protein: 7 g/dL (ref 6.5–8.1)

## 2021-05-31 LAB — MAGNESIUM: Magnesium: 2 mg/dL (ref 1.7–2.4)

## 2021-06-06 NOTE — Progress Notes (Signed)
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 We are joined today by [name]. The patient's last visit with [place] was on 7/8/022. The pt reports that she is doing well overall.  The pt reports that she has no acute issues since Elaine last visit. She has been taking Elaine B-Complex and B12 daily.   Lab results today  CBC - hgb 8.4  up from 7.2 Cmp - unremarkable.   REVIEW OF SYSTEMS:   .10 Point review of Systems was done is negative except as noted above.  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 Past Surgical History:  Procedure Laterality Date   ABDOMINAL HYSTERECTOMY  1986   APPENDECTOMY  1967   BREAST EXCISIONAL BIOPSY Right 1984   CATARACT EXTRACTION W/ INTRAOCULAR LENS IMPLANT Right 1990's   congential cataract   CYSTOSCOPY W/ URETERAL STENT PLACEMENT  11/12/2015   Procedure: CYSTOSCOPY WITH RETROGRADE PYELOGRAM/URETERAL STENT PLACEMENT;  Surgeon: [name], [name];  Location: [facility];  Service: Urology;;   CYSTOSCOPY W/ URETERAL STENT REMOVAL Left 11/22/2015   Procedure: CYSTOSCOPY WITH STENT REMOVAL;  Surgeon: [name], [name];  Location: [facility];  Service: Urology;  Laterality: Left;    CYSTOSCOPY/URETEROSCOPY/HOLMIUM LASER Left 11/22/2015   Procedure: CYSTOSCOPY/URETEROSCOPY/HOLMIUM LASER;  Surgeon: [name], [name];  Location: [facility];  Service: Urology;  Laterality: Left;  FLEX URETEROSCOPY   FEMUR IM NAIL Left 08/04/2015   Procedure: INTRAMEDULLARY (IM) NAIL FEMORAL;  Surgeon: [name], [name];  Location: [place];  Service: Orthopedics;  Laterality: Left;   IR KYPHO THORACIC WITH BONE BIOPSY  06/22/2018   TONSILLECTOMY  age 55    . Social History   Tobacco Use   Smoking status: Former    Packs/day: 1.00    Types: Cigarettes    Quit date: 08/03/2015    Years since quitting: 5.8   Smokeless tobacco: Never  Vaping Use   Vaping Use: Never used  Substance Use Topics   Alcohol use: Yes    Comment: occ   Drug use: Never    ALLERGIES:  is allergic to other and shellfish allergy.  MEDICATIONS:  Current Outpatient Medications  Medication Sig Dispense Refill   acetaminophen (TYLENOL) 325 MG tablet Take 2 tablets (650 mg total) by mouth every 6 (six) hours as needed for mild pain (or Fever >/= 101).     Cyanocobalamin (VITAMIN B-12) 1000 MCG SUBL TAKE 1 TABLET BY MOUTH EVERY DAY 100 tablet 3   docusate sodium (COLACE) 100 MG capsule Take 1 capsule (100 mg total) by mouth daily.     donepezil (ARICEPT) 5 MG tablet Take 5 mg by mouth 2 (two)  times daily.     ergocalciferol (VITAMIN D2) 1.25 MG (50000 UT) capsule Take 1 capsule (50,000 Units total) by mouth 2 (two) times a week. 12 capsule 3   folic acid (FOLVITE) 1 MG tablet TAKE 2 TABLETS BY MOUTH EVERY DAY 180 tablet 1   Multiple Vitamin (MULTIVITAMIN WITH MINERALS) TABS tablet Take 1 tablet by mouth daily.     potassium chloride SA (KLOR-CON) 20 MEQ tablet Take 1 tablet (20 mEq total) by mouth 2 (two) times daily. 30 tablet 0   senna-docusate (SENOKOT-S) 8.6-50 MG tablet Take 2 tablets by mouth 2 (two) times daily.     Tetrahydrozoline HCl (VISINE OP) Apply 1 drop to eye daily as needed  (allergies/dry eyes). (Patient not taking: Reported on 03/30/2021)     traMADol (ULTRAM) 50 MG tablet Take 1 tablet (50 mg total) by mouth every 8 (eight) hours as needed. (Patient not taking: No sig reported) 20 tablet 0   No current facility-administered medications for this visit.   Facility-Administered Medications Ordered in Other Visits  Medication Dose Route Frequency Provider Last Rate Last Admin   0.9 %  sodium chloride infusion (Manually program via Guardrails IV Fluids)  250 mL Intravenous Once Elaine Genera, [name]       acetaminophen (TYLENOL) tablet 650 mg  650 mg Oral Once Elaine Genera, [name]       methylPREDNISolone sodium succinate (SOLU-MEDROL) 40 mg/mL injection 40 mg  40 mg Intravenous Once Elaine Genera, [name]        PHYSICAL EXAMINATION: ECOG PERFORMANCE STATUS: 1 - Symptomatic but completely ambulatory  Vitals:   05/31/21 0952  BP: (!) 116/43  Pulse: (!) 54  Resp: 18  Temp: 98.6 F (37 C)  SpO2: 99%   Filed Weights   05/31/21 0952  Weight: 83 lb 3.2 oz (37.7 kg)   Body mass index is 16.25 kg/m.   Exam was given in a chair.  Marland Kitchen GENERAL:alert, in no acute distress and comfortable SKIN: no acute rashes, no significant lesions EYES: conjunctiva are pink and non-injected, sclera anicteric OROPHARYNX: MMM, no exudates, no oropharyngeal erythema or ulceration NECK: supple, no JVD LYMPH:  no palpable lymphadenopathy in the cervical, axillary or inguinal regions LUNGS: clear to auscultation b/l with normal respiratory effort HEART: regular rate & rhythm ABDOMEN:  normoactive bowel sounds , non tender, not distended. Extremity: no pedal edema PSYCH: alert & oriented x 3 with fluent speech NEURO: no focal motor/sensory deficits  LABORATORY DATA:   I have reviewed the data as listed  . CBC Latest Ref Rng & Units 05/31/2021 03/30/2021 03/30/2021  WBC 4.0 - 10.5 K/uL 9.4 12.6(H) 9.7  Hemoglobin 12.0 - 15.0 g/dL 8.4(L) 7.5(L) 7.2(L)  Hematocrit 36.0 -  46.0 % 24.4(L) 21.6(L) 21.6(L)  Platelets 150 - 400 K/uL 534(H) 423(H) 474(H)    CMP Latest Ref Rng & Units 05/31/2021 03/30/2021 09/07/2020  Glucose 70 - 99 mg/dL 88 76 87  BUN 8 - 23 mg/dL '13 8 9  '$ Creatinine 0.44 - 1.00 mg/dL 0.68 0.57 0.68  Sodium 135 - 145 mmol/L 143 144 144  Potassium 3.5 - 5.1 mmol/L 4.6 3.3(L) 3.1(L)  Chloride 98 - 111 mmol/L 102 104 104  CO2 22 - 32 mmol/L 31 32 33(H)  Calcium 8.9 - 10.3 mg/dL 9.3 8.7(L) 8.9  Total Protein 6.5 - 8.1 g/dL 7.0 6.4(L) 6.9  Total Bilirubin 0.3 - 1.2 mg/dL 2.7(H) 2.9(H) 3.1(H)  Alkaline Phos 38 - 126 U/L 131(H) 146(H) 112  AST 15 -  41 U/L 15 12(L) 16  ALT 0 - 44 U/L '9 7 11   '$ . Lab Results  Component Value Date   LDH 238 (H) 09/07/2020          RADIOGRAPHIC STUDIES: I have personally reviewed the radiological images as listed and agreed with the findings in the report. No results found.  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 Elaine hemoglobin tends to be lower during winter and has been in the 6-8 range. Overall stable hemoglobin levels in the 6-7 range with folic acid replacement and cold avoidance.  #2 IgM lambda MGUS - cannot rule out possibility of lymphoplasmacytic lymphoma though less likely given chronicity of Elaine condition. 08/28/18 MMP M spike down from 0.8 to 0.4  Flow cytometry- no clonoal B cell lymphoma   #3 History of previous B12 deficiency  #4 hypokalemia- resolved. -Potassium, begin taking BID with orange juice, and pt completed Elaine prednisone course so I expect Elaine potassium levels to increase  -08/28/18 Potassium at 3.6  #5. Low back pain -compression fracture L4 -MRI L spine -Acute/subacute superior endplate fracture at L4 with loss of height of 10-20%. No retropulsed bone. This is quite likely the cause of the low back pain. This was probably present on the radiographs of 01/20/2018, but not diagnosable because of the spinal curvature and unlevel endplates. Old  healed superior endplate fracture at [id number] Plan -controlled discomfort -prn Tramadol for back pain   #6 Spiculated Lung nodule in right upper lobe  First seen on 05/28/18 CTA Chest Hasn't changed over 6 month interval with repeat CT Chest in February 2020  10/26/18 CT Chest which revealed There is a spiculated solid nodule of the right upper lobe, measuring 5-6 mm and stable from prior examination. Recommend additional follow-up in 12 months to ensure ongoing stability. 2.  Emphysema. 3. Increased height loss deformities of T11 and T12.  08/13/2019 CT Chest ([id number]) revealed "4 mm posterior right upper lobe pulmonary nodule, unchanged. Dedicated follow-up imaging is not required per Fleischner Society guidelines. This recommendation follows the consensus statement Guidelines for Management of Small Pulmonary Nodules Detected on CT Images: From the Fleischner Society 2017; Radiology 2017; 284:228-243. 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   PLAN: -Discussed pt labwork today, 05/31/2021; Hgb is stable at 8.4, chemistries stable. -no indication for PRBC transfusion at this time. -Continue Vitamin B12, Vitamin D, and B-complex. -cold avoidance-- stay warm  FOLLOW UP: RTC with [name] with  labs in 3 months Referral to [facility] surgery for left arm nodular lesion concerning for skin cancer Referral to dermatology for recurrent skin cancer. Ongoing screening and management.   . The total time spent in the appointment was 31 minutes and more than 50% was on counseling and direct patient cares.  All of the patient's questions were answered with apparent satisfaction. The patient knows to call the clinic with any problems, questions or concerns.   Elaine Lone [name] [place] AAHIVMS [facility] Hematology/Oncology Physician [facility]  (Office):       [phone] (Work cell):  [phone] (Fax):           [phone]  I, [name], am acting as scribe for Elaine. Sullivan Lone, [name].    .I have reviewed the above documentation for accuracy and completeness, and I agree with the above. Elaine Genera [name]

## 2021-08-09 ENCOUNTER — Encounter (HOSPITAL_COMMUNITY): Payer: Self-pay

## 2021-08-09 ENCOUNTER — Emergency Department (HOSPITAL_COMMUNITY): Payer: Medicare Other

## 2021-08-09 ENCOUNTER — Inpatient Hospital Stay (HOSPITAL_COMMUNITY)
Admission: EM | Admit: 2021-08-09 | Discharge: 2021-08-21 | DRG: 871 | Disposition: A | Discharge status: Skilled Nursing Facility | Payer: Medicare Other | Attending: Family Medicine | Admitting: Family Medicine

## 2021-08-09 ENCOUNTER — Other Ambulatory Visit: Payer: Self-pay

## 2021-08-09 DIAGNOSIS — E876 Hypokalemia: Secondary | ICD-10-CM | POA: Diagnosis present

## 2021-08-09 DIAGNOSIS — J811 Chronic pulmonary edema: Secondary | ICD-10-CM | POA: Diagnosis present

## 2021-08-09 DIAGNOSIS — D5912 Cold autoimmune hemolytic anemia: Secondary | ICD-10-CM | POA: Diagnosis not present

## 2021-08-09 DIAGNOSIS — J9601 Acute respiratory failure with hypoxia: Secondary | ICD-10-CM | POA: Diagnosis present

## 2021-08-09 DIAGNOSIS — Z79899 Other long term (current) drug therapy: Secondary | ICD-10-CM

## 2021-08-09 DIAGNOSIS — D649 Anemia, unspecified: Secondary | ICD-10-CM

## 2021-08-09 DIAGNOSIS — J449 Chronic obstructive pulmonary disease, unspecified: Secondary | ICD-10-CM | POA: Diagnosis present

## 2021-08-09 DIAGNOSIS — R823 Hemoglobinuria: Secondary | ICD-10-CM | POA: Diagnosis present

## 2021-08-09 DIAGNOSIS — A419 Sepsis, unspecified organism: Secondary | ICD-10-CM | POA: Diagnosis not present

## 2021-08-09 DIAGNOSIS — R7401 Elevation of levels of liver transaminase levels: Secondary | ICD-10-CM | POA: Diagnosis present

## 2021-08-09 DIAGNOSIS — J90 Pleural effusion, not elsewhere classified: Secondary | ICD-10-CM | POA: Diagnosis present

## 2021-08-09 DIAGNOSIS — Z20822 Contact with and (suspected) exposure to covid-19: Secondary | ICD-10-CM | POA: Diagnosis present

## 2021-08-09 DIAGNOSIS — Z87442 Personal history of urinary calculi: Secondary | ICD-10-CM

## 2021-08-09 DIAGNOSIS — G928 Other toxic encephalopathy: Secondary | ICD-10-CM | POA: Diagnosis present

## 2021-08-09 DIAGNOSIS — I700 Atherosclerosis of aorta: Secondary | ICD-10-CM | POA: Diagnosis present

## 2021-08-09 DIAGNOSIS — Z9841 Cataract extraction status, right eye: Secondary | ICD-10-CM

## 2021-08-09 DIAGNOSIS — Z961 Presence of intraocular lens: Secondary | ICD-10-CM | POA: Diagnosis present

## 2021-08-09 DIAGNOSIS — E538 Deficiency of other specified B group vitamins: Secondary | ICD-10-CM | POA: Diagnosis present

## 2021-08-09 DIAGNOSIS — Z9049 Acquired absence of other specified parts of digestive tract: Secondary | ICD-10-CM

## 2021-08-09 DIAGNOSIS — Z87891 Personal history of nicotine dependence: Secondary | ICD-10-CM

## 2021-08-09 DIAGNOSIS — K8020 Calculus of gallbladder without cholecystitis without obstruction: Secondary | ICD-10-CM | POA: Diagnosis present

## 2021-08-09 DIAGNOSIS — D599 Acquired hemolytic anemia, unspecified: Secondary | ICD-10-CM

## 2021-08-09 DIAGNOSIS — R0902 Hypoxemia: Secondary | ICD-10-CM

## 2021-08-09 DIAGNOSIS — R824 Acetonuria: Secondary | ICD-10-CM | POA: Diagnosis present

## 2021-08-09 DIAGNOSIS — J440 Chronic obstructive pulmonary disease with acute lower respiratory infection: Secondary | ICD-10-CM | POA: Diagnosis present

## 2021-08-09 DIAGNOSIS — E872 Acidosis, unspecified: Secondary | ICD-10-CM | POA: Diagnosis present

## 2021-08-09 DIAGNOSIS — Z90710 Acquired absence of both cervix and uterus: Secondary | ICD-10-CM

## 2021-08-09 DIAGNOSIS — Z91013 Allergy to seafood: Secondary | ICD-10-CM

## 2021-08-09 DIAGNOSIS — J189 Pneumonia, unspecified organism: Secondary | ICD-10-CM | POA: Diagnosis present

## 2021-08-09 DIAGNOSIS — Z66 Do not resuscitate: Secondary | ICD-10-CM | POA: Diagnosis present

## 2021-08-09 DIAGNOSIS — F0390 Unspecified dementia without behavioral disturbance: Secondary | ICD-10-CM | POA: Diagnosis present

## 2021-08-09 HISTORY — DX: Atherosclerosis of aorta: I70.0

## 2021-08-09 LAB — CBC WITH DIFFERENTIAL/PLATELET
Abs Immature Granulocytes: 0.41 10*3/uL — ABNORMAL HIGH (ref 0.00–0.07)
Abs Immature Granulocytes: 0.37 10*3/uL — ABNORMAL HIGH (ref 0.00–0.07)
Basophils Absolute: 0.1 10*3/uL (ref 0.0–0.1)
Basophils Absolute: 0.1 10*3/uL (ref 0.0–0.1)
Basophils Relative: 0 %
Basophils Relative: 0 %
Eosinophils Absolute: 0.6 10*3/uL — ABNORMAL HIGH (ref 0.0–0.5)
Eosinophils Absolute: 0.1 10*3/uL (ref 0.0–0.5)
Eosinophils Relative: 2 %
Eosinophils Relative: 0 %
HCT: 13.5 % — ABNORMAL LOW (ref 36.0–46.0)
HCT: 13.8 % — ABNORMAL LOW (ref 36.0–46.0)
Hemoglobin: 4.5 g/dL — CL (ref 12.0–15.0)
Hemoglobin: 4.3 g/dL — CL (ref 12.0–15.0)
Immature Granulocytes: 2 %
Immature Granulocytes: 2 %
Lymphocytes Relative: 13 %
Lymphocytes Relative: 13 %
Lymphs Abs: 3.2 10*3/uL (ref 0.7–4.0)
Lymphs Abs: 2.9 10*3/uL (ref 0.7–4.0)
MCH: 35.4 pg — ABNORMAL HIGH (ref 26.0–34.0)
MCH: 33.9 pg (ref 26.0–34.0)
MCHC: 33.3 g/dL (ref 30.0–36.0)
MCHC: 31.2 g/dL (ref 30.0–36.0)
MCV: 106.3 fL — ABNORMAL HIGH (ref 80.0–100.0)
MCV: 108.7 fL — ABNORMAL HIGH (ref 80.0–100.0)
Monocytes Absolute: 0.8 10*3/uL (ref 0.1–1.0)
Monocytes Absolute: 1.2 10*3/uL — ABNORMAL HIGH (ref 0.1–1.0)
Monocytes Relative: 3 %
Monocytes Relative: 5 %
Neutro Abs: 19.2 10*3/uL — ABNORMAL HIGH (ref 1.7–7.7)
Neutro Abs: 18.3 10*3/uL — ABNORMAL HIGH (ref 1.7–7.7)
Neutrophils Relative %: 80 %
Neutrophils Relative %: 80 %
Platelets: 655 10*3/uL — ABNORMAL HIGH (ref 150–400)
Platelets: 528 10*3/uL — ABNORMAL HIGH (ref 150–400)
RBC: 1.27 MIL/uL — ABNORMAL LOW (ref 3.87–5.11)
RBC: 1.27 MIL/uL — ABNORMAL LOW (ref 3.87–5.11)
RDW: 17.1 % — ABNORMAL HIGH (ref 11.5–15.5)
RDW: 17.2 % — ABNORMAL HIGH (ref 11.5–15.5)
WBC: 24.3 10*3/uL — ABNORMAL HIGH (ref 4.0–10.5)
WBC: 22.9 10*3/uL — ABNORMAL HIGH (ref 4.0–10.5)
nRBC: 1.6 % — ABNORMAL HIGH (ref 0.0–0.2)
nRBC: 2 % — ABNORMAL HIGH (ref 0.0–0.2)

## 2021-08-09 LAB — COMPREHENSIVE METABOLIC PANEL
ALT: 61 U/L — ABNORMAL HIGH (ref 0–44)
AST: 113 U/L — ABNORMAL HIGH (ref 15–41)
Albumin: 3.7 g/dL (ref 3.5–5.0)
Alkaline Phosphatase: 90 U/L (ref 38–126)
Anion gap: 12 (ref 5–15)
BUN: 28 mg/dL — ABNORMAL HIGH (ref 8–23)
CO2: 28 mmol/L (ref 22–32)
Calcium: 7.9 mg/dL — ABNORMAL LOW (ref 8.9–10.3)
Chloride: 97 mmol/L — ABNORMAL LOW (ref 98–111)
Creatinine, Ser: 0.8 mg/dL (ref 0.44–1.00)
GFR, Estimated: >60 mL/min (ref >60)
Glucose, Bld: 141 mg/dL — ABNORMAL HIGH (ref 70–99)
Potassium: 3.1 mmol/L — ABNORMAL LOW (ref 3.5–5.1)
Sodium: 137 mmol/L (ref 135–145)
Total Bilirubin: 5.1 mg/dL — ABNORMAL HIGH (ref 0.3–1.2)
Total Protein: 6.7 g/dL (ref 6.5–8.1)

## 2021-08-09 LAB — URINALYSIS, ROUTINE W REFLEX MICROSCOPIC
Bacteria, UA: NONE SEEN
Bilirubin Urine: NEGATIVE
Glucose, UA: NEGATIVE mg/dL
Ketones, ur: 5 mg/dL — AB
Leukocytes,Ua: NEGATIVE
Nitrite: NEGATIVE
Protein, ur: NEGATIVE mg/dL
Specific Gravity, Urine: >1.046 — ABNORMAL HIGH (ref 1.005–1.030)
pH: 5 (ref 5.0–8.0)

## 2021-08-09 LAB — RESP PANEL BY RT-PCR (FLU A&B, COVID) ARPGX2
Influenza A by PCR: NEGATIVE
Influenza B by PCR: NEGATIVE
SARS Coronavirus 2 by RT PCR: NEGATIVE

## 2021-08-09 LAB — PREPARE RBC (CROSSMATCH)

## 2021-08-09 LAB — BILIRUBIN, FRACTIONATED(TOT/DIR/INDIR)
Bilirubin, Direct: 0.7 mg/dL — ABNORMAL HIGH (ref 0.0–0.2)
Indirect Bilirubin: 3.1 mg/dL — ABNORMAL HIGH (ref 0.3–0.9)
Total Bilirubin: 3.8 mg/dL — ABNORMAL HIGH (ref 0.3–1.2)

## 2021-08-09 LAB — RETICULOCYTES
Immature Retic Fract: 39.3 % — ABNORMAL HIGH (ref 2.3–15.9)
Immature Retic Fract: 42.1 % — ABNORMAL HIGH (ref 2.3–15.9)
RBC.: 1.31 MIL/uL — ABNORMAL LOW (ref 3.87–5.11)
RBC.: 1.27 MIL/uL — ABNORMAL LOW (ref 3.87–5.11)
Retic Count, Absolute: 121.7 10*3/uL (ref 19.0–186.0)
Retic Count, Absolute: 142 10*3/uL (ref 19.0–186.0)
Retic Ct Pct: 9.3 % — ABNORMAL HIGH (ref 0.4–3.1)
Retic Ct Pct: 11.2 % — ABNORMAL HIGH (ref 0.4–3.1)

## 2021-08-09 LAB — CBG MONITORING, ED: Glucose-Capillary: 125 mg/dL — ABNORMAL HIGH (ref 70–99)

## 2021-08-09 LAB — PROTIME-INR
INR: 1.2 (ref 0.8–1.2)
INR: 1.3 — ABNORMAL HIGH (ref 0.8–1.2)
Prothrombin Time: 15.2 seconds (ref 11.4–15.2)
Prothrombin Time: 16.5 seconds — ABNORMAL HIGH (ref 11.4–15.2)

## 2021-08-09 LAB — LACTATE DEHYDROGENASE
LDH: 657 U/L — ABNORMAL HIGH (ref 98–192)
LDH: 699 U/L — ABNORMAL HIGH (ref 98–192)

## 2021-08-09 LAB — LACTIC ACID, PLASMA
Lactic Acid, Venous: 2.2 mmol/L (ref 0.5–1.9)
Lactic Acid, Venous: 1.2 mmol/L (ref 0.5–1.9)
Lactic Acid, Venous: 1.7 mmol/L (ref 0.5–1.9)

## 2021-08-09 LAB — GLUCOSE, CAPILLARY: Glucose-Capillary: 120 mg/dL — ABNORMAL HIGH (ref 70–99)

## 2021-08-09 LAB — APTT: aPTT: 25 seconds (ref 24–36)

## 2021-08-09 LAB — MAGNESIUM: Magnesium: 1.7 mg/dL (ref 1.7–2.4)

## 2021-08-09 LAB — POC OCCULT BLOOD, ED: Fecal Occult Bld: NEGATIVE

## 2021-08-09 LAB — AMMONIA: Ammonia: 34 umol/L (ref 9–35)

## 2021-08-09 LAB — PROCALCITONIN: Procalcitonin: 1.59 ng/mL

## 2021-08-09 MED ORDER — CHLORHEXIDINE GLUCONATE CLOTH 2 % EX PADS
6.0000 | MEDICATED_PAD | Freq: Every day | CUTANEOUS | Status: DC
  Administered 2021-08-11 – 2021-08-13 (×4): 6 via TOPICAL

## 2021-08-09 MED ORDER — SODIUM CHLORIDE 0.9 % IV SOLN: 10.0000 mL/h | Freq: Once | INTRAVENOUS | Status: DC

## 2021-08-09 MED ORDER — POTASSIUM CHLORIDE 10 MEQ/100ML IV SOLN
10.0000 meq | Freq: Once | INTRAVENOUS | Status: AC
  Administered 2021-08-09: 15:00:00 10 meq via INTRAVENOUS
  Filled 2021-08-09 (×2): qty 100

## 2021-08-09 MED ORDER — MAGNESIUM SULFATE IN D5W 1-5 GM/100ML-% IV SOLN
1.0000 g | Freq: Once | INTRAVENOUS | Status: AC
  Administered 2021-08-10: 1 g via INTRAVENOUS
  Filled 2021-08-09 (×2): qty 100

## 2021-08-09 MED ORDER — VITAMIN B-12 1000 MCG PO TABS
2000.0000 ug | ORAL_TABLET | Freq: Every day | ORAL | Status: DC
  Administered 2021-08-11 – 2021-08-21 (×11): 2000 ug via ORAL
  Filled 2021-08-09 (×11): qty 2

## 2021-08-09 MED ORDER — SODIUM CHLORIDE 0.9 % IV SOLN
1.0000 g | INTRAVENOUS | Status: AC
  Administered 2021-08-10 – 2021-08-13 (×4): 1 g via INTRAVENOUS
  Filled 2021-08-09 (×5): qty 10

## 2021-08-09 MED ORDER — POTASSIUM CHLORIDE CRYS ER 20 MEQ PO TBCR: 40.0000 meq | EXTENDED_RELEASE_TABLET | Freq: Once | ORAL | Status: DC

## 2021-08-09 MED ORDER — LACTATED RINGERS IV BOLUS (SEPSIS)
1000.0000 mL | Freq: Once | INTRAVENOUS | Status: AC
  Administered 2021-08-09: 20:00:00 1000 mL via INTRAVENOUS

## 2021-08-09 MED ORDER — FOLIC ACID 1 MG PO TABS
2.0000 mg | ORAL_TABLET | Freq: Every day | ORAL | Status: DC
  Administered 2021-08-11 – 2021-08-21 (×11): 2 mg via ORAL
  Filled 2021-08-09 (×11): qty 2

## 2021-08-09 MED ORDER — SODIUM CHLORIDE 0.9 % IV SOLN: 2.0000 g | INTRAVENOUS | Status: DC

## 2021-08-09 MED ORDER — MAGNESIUM SULFATE IN D5W 1-5 GM/100ML-% IV SOLN
1.0000 g | Freq: Once | INTRAVENOUS | Status: DC
  Filled 2021-08-09: qty 100

## 2021-08-09 MED ORDER — SODIUM CHLORIDE 0.9 % IV SOLN
500.0000 mg | INTRAVENOUS | Status: AC
  Administered 2021-08-10 – 2021-08-12 (×4): 500 mg via INTRAVENOUS
  Filled 2021-08-09 (×4): qty 500

## 2021-08-09 MED ORDER — ORAL CARE MOUTH RINSE
15.0000 mL | Freq: Two times a day (BID) | OROMUCOSAL | Status: DC
  Administered 2021-08-09 – 2021-08-21 (×18): 15 mL via OROMUCOSAL

## 2021-08-09 MED ORDER — BACITRACIN ZINC 500 UNIT/GM EX OINT: TOPICAL_OINTMENT | Freq: Once | CUTANEOUS | Status: DC

## 2021-08-09 MED ORDER — LACTATED RINGERS IV BOLUS (SEPSIS): 250.0000 mL | Freq: Once | INTRAVENOUS | Status: DC

## 2021-08-09 MED ORDER — IOHEXOL 350 MG/ML SOLN
80.0000 mL | Freq: Once | INTRAVENOUS | Status: AC | PRN
  Administered 2021-08-09: 14:00:00 80 mL via INTRAVENOUS

## 2021-08-09 NOTE — Progress Notes (Signed)
Hematology Short Note  Discussed patient with Dr. Gilford Raid in the emergency room. I was called for recommendations regarding management of the patient's cold agglutinin hemolytic anemia symptomatic anemia hemoglobin 4.5 and new jaundice.  . CBC Latest Ref Rng & Units 08/09/2021 05/31/2021 03/30/2021  WBC 4.0 - 10.5 K/uL 24.3(H) 9.4 12.6(H)  Hemoglobin 12.0 - 15.0 g/dL 4.5(LL) 8.4(L) 7.5(L)  Hematocrit 36.0 - 46.0 % 13.5(L) 24.4(L) 21.6(L)  Platelets 150 - 400 K/uL 655(H) 534(H) 423(H)   .CBC    Component Value Date/Time   WBC 24.3 (H) 08/09/2021 1246   RBC 1.27 (L) 08/09/2021 1246   HGB 4.5 (LL) 08/09/2021 1246   HGB 8.4 (L) 05/31/2021 0914   HGB 7.9 (L) 05/22/2017 1218   HCT 13.5 (L) 08/09/2021 1246   HCT 23.7 (L) 05/22/2017 1218   PLT 655 (H) 08/09/2021 1246   PLT 534 (H) 05/31/2021 0914   PLT 464 (H) 05/22/2017 1218   MCV 106.3 (H) 08/09/2021 1246   MCV 108.7 (H) 05/22/2017 1218   MCH 35.4 (H) 08/09/2021 1246   MCHC 33.3 08/09/2021 1246   RDW 17.1 (H) 08/09/2021 1246   RDW 15.2 (H) 05/22/2017 1218   LYMPHSABS 3.2 08/09/2021 1246   LYMPHSABS 3.7 (H) 05/22/2017 1218   MONOABS 0.8 08/09/2021 1246   MONOABS 0.6 05/22/2017 1218   EOSABS 0.6 (H) 08/09/2021 1246   EOSABS 0.1 05/22/2017 1218   BASOSABS 0.1 08/09/2021 1246   BASOSABS 0.1 05/22/2017 1218   . CMP Latest Ref Rng & Units 08/09/2021 05/31/2021 03/30/2021  Glucose 70 - 99 mg/dL 141(H) 88 76  BUN 8 - 23 mg/dL 28(H) 13 8  Creatinine 0.44 - 1.00 mg/dL 0.80 0.68 0.57  Sodium 135 - 145 mmol/L 137 143 144  Potassium 3.5 - 5.1 mmol/L 3.1(L) 4.6 3.3(L)  Chloride 98 - 111 mmol/L 97(L) 102 104  CO2 22 - 32 mmol/L 28 31 32  Calcium 8.9 - 10.3 mg/dL 7.9(L) 9.3 8.7(L)  Total Protein 6.5 - 8.1 g/dL 6.7 7.0 6.4(L)  Total Bilirubin 0.3 - 1.2 mg/dL 5.1(H) 2.7(H) 2.9(H)  Alkaline Phos 38 - 126 U/L 90 131(H) 146(H)  AST 15 - 41 U/L 113(H) 15 12(L)  ALT 0 - 44 U/L 61(H) 9 7     1) Acute exacerbation of the patient's chronic cold  agglutinin disease related hemolytic anemia 2) Symptomatic Anemia PLAN -Discussed patient's care with Dr. Gilford Raid. -CBC was not drawn in prewarmed tubes and not transported at 37 degrees for processing which might have led to underestimation of her actual hemoglobin levels. -Would recommend CBC and all hemolytic labs to be collected in prewarmed tubes at 37 degrees and samples to be run at 37C to get accurate results. -Patient could have had acute exacerbation due to failure to maintain cold avoidance in the context of cooling weather.  Possible viral infection or sepsis could also be additional triggers. B12 or folate deficiency can also worsen anemia from her chronic hemolysis. -Would recommend continuing B12 1000 mcg daily and folic acid 2 mg p.o. daily. -The patient needs to be kept in a warm room and be dressed and warm cloths from head to toe limiting any open skin. -All IV fluids and blood products need to be given through a blood warmer. -Transfuse to maintain hemoglobin more than 7 -If patient's hemolysis is not controlled with warming and transfusion support and treatment of an infection then we would need to consider plasmapheresis and possible Rituxan infusions. -Hematology will follow up officially tomorrow. -To hospitalist  service or ICU as needed.  Sullivan Lone MD MS

## 2021-08-09 NOTE — H&P (Signed)
History and Physical    [name] MKL:[id number] DOB: 19-Jul-1941 DOA: 08/09/2021  PCP: [name], MD   Patient coming from: Home.  I have personally briefly reviewed patient's old medical records in [place]  Chief Complaint: AMS and weakness.  HPI: [name] is a 80 y.o. female with medical history significant of B12 deficiency, bilateral carotid artery gnosis, COPD, diverticulosis/diverticulitis of colon, COPD, former smoker, urolithiasis of the left kidney and ureter, history of idiopathic chronic cold agglutinin disease who was brought to the emergency department via EMS due to decreased mentation, generalized weakness and jaundice.  EMS found the patient to be hypoxic 88% on room air and placed her on supplemental oxygen.  She denied headache, back/chest/abdominal or joint pain.  Her appetite is decreased.  She feels somnolent.  She is unable to elaborate at the moment.  ED Course: Initial vital signs were temperature 98.3 F, pulse 91, respiration 18, BP 111/52 mmHg O2 sat 94% on [place] at 2 LPM.  The patient received KCl 10 mEq IVPB.  Hematology (Dr. Irene Limbo) was contacted who made recommendations (please see Dr. Grier Mitts note).  Lab work: Urinalysis shows specific gravity of more than 1.046 with small hemoglobinuria and ketonuria 5 mg/dL.  CBC showed a white count of 24.3 with 80% neutrophils, hemoglobin of 4.5 g/dL and platelets 655.  PT and INR were normal.  LDH was 699 units/L.  Lactic acid 2.2 mmol/L.  Ammonia level was unremarkable.  CMP showed a potassium of 3.1 and chloride of 97 mmol/L.  Bilirubin was 5.1, glucose 141, BUN 28 and calcium 7.9 mg/dL.  AST 113 and ALT 61 units/L.  The rest of the CMP values were normal.  Imaging: A portable chest radiograph showed increased opacity of the left lung base.  CT abdomen/pelvis showed nodular surface contour of the liver suspicious for cirrhosis.  There was cholelithiasis without evidence of cholecystitis or  bile duct dilatation.  Bilateral pleural effusions.  Diverticulosis without diverticulitis.  Multiple lumbar compression deformities with progression since 2019.  Aortic atherosclerosis.  Please see images and full daily report for further detail.  Review of Systems: As per HPI otherwise all other systems reviewed and are negative.  Past Medical History:  Diagnosis Date   B12 deficiency    Bilateral carotid artery stenosis    right CCA and ICA 1-39%/    left ICA 40-59%  per last duplex  10-19-2015   COPD (chronic obstructive pulmonary disease) (HCC)    Diverticulosis of colon    History of diverticulitis of colon    2004   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[place])    montiored by dr Marko Plume   Renal and ureteric calculus    left   Past Surgical History:  Procedure Laterality Date   ABDOMINAL HYSTERECTOMY  1986   APPENDECTOMY  1967   BREAST EXCISIONAL BIOPSY Right 1984   CATARACT EXTRACTION W/ INTRAOCULAR LENS IMPLANT Right 1990's   congential cataract   CYSTOSCOPY W/ URETERAL STENT PLACEMENT  11/12/2015   Procedure: CYSTOSCOPY WITH RETROGRADE PYELOGRAM/URETERAL STENT PLACEMENT;  Surgeon: [name], MD;  Location: [facility];  Service: Urology;;   CYSTOSCOPY W/ URETERAL STENT REMOVAL Left 11/22/2015   Procedure: CYSTOSCOPY WITH STENT REMOVAL;  Surgeon: [name], MD;  [facility];  Service: Urology;  Laterality: Left;   CYSTOSCOPY/URETEROSCOPY/HOLMIUM LASER Left 11/22/2015   Procedure: CYSTOSCOPY/URETEROSCOPY/HOLMIUM LASER;  Surgeon: [name], MD;  Location: [facility];  Service: Urology;  Laterality: Left;  FLEX URETEROSCOPY   FEMUR IM NAIL Left 08/04/2015   Procedure: INTRAMEDULLARY (IM) NAIL FEMORAL;  Surgeon: [name], MD;  Location: [facility];  Service: Orthopedics;  Laterality: Left;   IR KYPHO THORACIC WITH BONE BIOPSY  06/22/2018   TONSILLECTOMY  age 60   Social History  reports that she quit smoking about 6 years ago. Her  smoking use included cigarettes. She smoked an average of 1 pack per day. She has never used smokeless tobacco. She reports current alcohol use. She reports that she does not use drugs.  Allergies  Allergen Reactions   Other Other (See Comments)    "Most antibiotics give yeast infection"   Shellfish Allergy Hives   Family History  Problem Relation Age of Onset   Breast cancer Cousin    Prior to Admission medications   Medication Sig Start Date End Date Taking? Authorizing Provider  acetaminophen (TYLENOL) 325 MG tablet Take 2 tablets (650 mg total) by mouth every 6 (six) hours as needed for mild pain (or Fever >/= 101). 08/08/15   Delfina Redwood, MD  ALPRAZolam Duanne Moron) 0.25 MG tablet Take 0.25 mg by mouth daily. 06/13/21   [provider]  Cyanocobalamin (VITAMIN B-12) 1000 MCG SUBL TAKE 1 TABLET BY MOUTH EVERY DAY Patient taking differently: Place 1,000 mcg under the tongue daily. 08/09/20   Brunetta Genera, MD  docusate sodium (COLACE) 100 MG capsule Take 1 capsule (100 mg total) by mouth daily. 08/08/15   Delfina Redwood, MD  donepezil (ARICEPT) 5 MG tablet Take 5 mg by mouth 2 (two) times daily. 03/06/21   [provider]  ergocalciferol (VITAMIN D2) 1.25 MG (50000 UT) capsule Take 1 capsule (50,000 Units total) by mouth 2 (two) times a week. 01/21/19   Brunetta Genera, MD  folic acid (FOLVITE) 1 MG tablet TAKE 2 TABLETS BY MOUTH EVERY DAY 05/11/20   Brunetta Genera, MD  furosemide (LASIX) 20 MG tablet Take 20 mg by mouth daily. 04/17/21   [provider]  Multiple Vitamin (MULTIVITAMIN WITH MINERALS) TABS tablet Take 1 tablet by mouth daily. 06/03/18   Mikhail, Velta Addison, DO  Potassium Chloride ER 20 MEQ TBCR Take 20 mEq by mouth daily. 04/17/21   [provider]  senna-docusate (SENOKOT-S) 8.6-50 MG tablet Take 2 tablets by mouth 2 (two) times daily. 06/02/18   Mikhail, Velta Addison, DO  traMADol (ULTRAM) 50 MG tablet Take 1 tablet (50 mg  total) by mouth every 8 (eight) hours as needed. Patient not taking: Reported on 08/28/2018 06/02/18   Cristal Ford, DO   Physical Exam: Vitals:   08/09/21 1600 08/09/21 1725 08/09/21 1730 08/09/21 1800  BP: (!) 114/48  (!) 125/44 103/83  Pulse: 87  (!) 107 (!) 102  Resp: _0 Temp:  (!) 101.6 F (38.7 C)    TempSrc:  Rectal    SpO2: 100%  95% 95%  Weight:      Height:       Constitutional: Looks acutely ill but nontoxic.  NAD, calm, comfortable Eyes: PERRL, lids and conjunctivae normal.  Icteric sclera. ENMT: Mucous membranes are dry.  Posterior pharynx clear of any exudate or lesions. Neck: Normal, supple, no masses, no thyromegaly Respiratory: Clear to auscultation bilaterally, no wheezing, no crackles. Normal respiratory effort. No accessory muscle use.  Cardiovascular: Tachycardic RR 102 bpm, no murmurs / rubs / gallops. No extremity edema. 2+ pedal pulses. No carotid bruits.  Abdomen: No distention.  Bowel sounds positive.  Soft, no tenderness, no masses palpated.  Musculoskeletal: Moderate to severe generalized weakness.  No clubbing / cyanosis.  Good ROM, no contractures. Normal muscle tone.  Skin: Positive icterus.  Lower extremities showed pretibial operations with severely decreased skin turgor with previously edematous extremities showing skin folds. Neurologic: CN 2-12 grossly intact. Sensation intact, DTR normal.  Strength seems symmetric. Psychiatric: Alert and oriented x 2, partially oriented to time/date and situation.. Normal mood.   Labs on Admission: I have personally reviewed following labs and imaging studies  CBC: Recent Labs  Lab 08/09/21 1246  WBC 24.3*  NEUTROABS 19.2*  HGB 4.5*  HCT 13.5*  MCV 106.3*  PLT 734*   Basic Metabolic Panel: Recent Labs  Lab 08/09/21 1246 08/09/21 1714  NA 137  --   K 3.1*  --   CL 97*  --   CO2 28  --   GLUCOSE 141*  --   BUN 28*  --   CREATININE 0.80  --   CALCIUM 7.9*  --   MG  --  1.7    GFR: Estimated Creatinine Clearance: 34.2 mL/min (by C-G formula based on SCr of 0.8 mg/dL).  Liver Function Tests: Recent Labs  Lab 08/09/21 1246 08/09/21 1714  AST 113*  --   ALT 61*  --   ALKPHOS 90  --   BILITOT 5.1* 3.8*  PROT 6.7  --   ALBUMIN 3.7  --    Urine analysis:    Component Value Date/Time   COLORURINE YELLOW 08/09/2021 [place] 08/09/2021 1246   LABSPEC >1.046 (H) 08/09/2021 1246   PHURINE 5.0 08/09/2021 1246   GLUCOSEU NEGATIVE 08/09/2021 1246   HGBUR SMALL (A) 08/09/2021 1246   BILIRUBINUR NEGATIVE 08/09/2021 1246   KETONESUR 5 (A) 08/09/2021 1246   PROTEINUR NEGATIVE 08/09/2021 1246   NITRITE NEGATIVE 08/09/2021 1246   LEUKOCYTESUR NEGATIVE 08/09/2021 1246   Radiological Exams on Admission: CT ABDOMEN PELVIS W CONTRAST  Result Date: 08/09/2021 CLINICAL DATA:  Abdominal pain, jaundice EXAM: CT ABDOMEN AND PELVIS WITH CONTRAST TECHNIQUE: Multidetector CT imaging of the abdomen and pelvis was performed using the standard protocol following bolus administration of intravenous contrast. CONTRAST:  3mL OMNIPAQUE IOHEXOL 350 MG/ML SOLN COMPARISON:  CT abdomen/pelvis 05/28/2018, CT abdomen/pelvis 05/28/2018 FINDINGS: Lower chest: There are bilateral pleural effusions with adjacent atelectasis, left slightly larger than right. The imaged heart is unremarkable. Hepatobiliary: There are no focal liver lesions. The liver surface contour is mildly nodular. There is cholelithiasis without evidence of acute cholecystitis. There is no intra or extrahepatic biliary ductal dilatation. Pancreas: The pancreas is atrophic. There are no focal lesions or contour abnormalities. There is no main pancreatic ductal dilatation or peripancreatic inflammatory change. Spleen: Unremarkable. Adrenals/Urinary Tract: The adrenals are unremarkable. Slight contour abnormality in the left interpolar region is unchanged going back to 2019. A hypodense lesion in the right lower  pole is too small to characterize but most likely reflects a small cyst. There is a 5 mm nonobstructing left renal stone. There is no hydronephrosis or hydroureter. The bladder is decompressed but grossly unremarkable. Stomach/Bowel: The stomach is unremarkable. There is no evidence of bowel obstruction. There is sigmoid diverticulosis without evidence of acute diverticulitis. There is no evidence of abnormal bowel wall thickening or inflammatory change. Vascular/Lymphatic: There is extensive vascular calcification throughout the nonaneurysmal abdominal aorta. The major branch vessels are patent. The main portal and splenic veins are patent. There is no abdominal or pelvic lymphadenopathy. Reproductive: The uterus is  surgically absent. There is a 1.5 cm left adnexal cyst, present since at least 2019. There is no right adnexal mass. Other: There is trace free fluid in the pelvis in the presacral space. There is no upper abdominal ascites. Musculoskeletal: The patient is status post kyphoplasty at T12. There is compression deformity of the T11, L1, L3, L4, and L5 vertebral bodies, overall progressed since 2019, particularly at L1 and L3. There is no evidence of acute fracture or aggressive osseous lesion. Postsurgical changes are partially imaged in the left femur. IMPRESSION: 1. Nodular surface contour of the liver suspicious for cirrhosis. 2. Cholelithiasis without evidence of acute cholecystitis. No biliary ductal dilatation. 3. 5 mm nonobstructing left renal stone. 4. Bilateral pleural effusions. 5. Trace free fluid in the presacral space, nonspecific. 6. Diverticulosis without evidence of acute diverticulitis. 7. Multiple lumbar compression deformities have overall progressed since 2019, particularly at L1 and L3. Post kyphoplasty changes are noted at T12. Aortic Atherosclerosis (ICD10-I70.0). Electronically Signed   By: Valetta Mole M.D.   On: 08/09/2021 14:35   DG Chest Portable 1 View  Result Date:  08/09/2021 CLINICAL DATA:  Shortness of breath EXAM: PORTABLE CHEST 1 VIEW COMPARISON:  01/10/2021 FINDINGS: Patient is slightly rotated. Mild cardiomegaly. Increased opacity at the left lung base may reflect a combination of pleural effusion and consolidation. Right lung field is clear. No pneumothorax. Bony structures appear demineralized. IMPRESSION: Increased opacity at the left lung base may reflect a combination of pleural effusion and consolidation. Electronically Signed   By: Davina Poke D.O.   On: 08/09/2021 14:09    EKG: Independently reviewed.  Vent. 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 Continue supplemental oxygen. Keep the patient warm. Keep the patient's room warm. Continue warm IV fluids. Transfuse warmed PRBC when ready. Continue cyanocobalamin and folate. Hematology input greatly appreciated.  Active Problems:   Sepsis due to undetermined organism  Sepsis criteria met: Tachypnea, tachycardia, fever. Leukocytosis, lactic acidosis, chest imaging. Continue PRBC transfusions and IV fluids. Follow-up blood culture and sensitivity.    Hypokalemia Replacing. Magnesium was supplemented. Follow-up potassium level.      COPD (chronic obstructive pulmonary disease) ([place]) Supplemental oxygen as needed. Bronchodilators as needed.    Elevated transaminase level   Hyperbilirubinemia In the setting of hemolytic anemia. Follow-up CMP.    Aortic atherosclerosis  Not on medical therapy. Will defer starting statin at the moment. Follow-up with PCP as an outpatient.    DVT prophylaxis: SCDs. Code Status:   Full code. Family Communication:   Disposition Plan:   Patient is from:  Home.  Anticipated DC to:  Home.  Anticipated DC  date:  08/12/2021.  Anticipated DC barriers: Clinical status.  Consults called:  Hematology (Dr. Irene Limbo). Admission status:  Stepdown/observation.  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  Patient will remain in the hospital for blood transfusion, further work-up and IV antibiotics.  Reubin Milan MD [facility]  How to contact the [facility] Attending or Consulting provider [place] or covering provider during after hours [place], for this patient?   Check the care team in [facility] and look for a) attending/consulting [facility] provider listed and b) the [facility] team listed Log into  [email or web address] and use [place]'s universal password to access. If you do not have the password, please contact the hospital operator. Locate the [facility] provider you are looking for under [facility] and page to a number that you can be directly reached. If you still have difficulty reaching the provider, please page the [facility] (Director on Call) for the Hospitalists listed on amion for assistance.  08/09/2021, 6:55 PM   This document was prepared using Dragon voice recognition software and may contain some unintended transcription errors.

## 2021-08-09 NOTE — ED Provider Notes (Signed)
[facility] DEPT Provider Note   CSN: [id number] Arrival date & time: 08/09/21  1234     History Chief Complaint  Patient presents with   Altered Mental Status   Weakness    [name] is a 80 y.o. female.  Pt presents to the ED today with AMS.  Pt lives at home with her sister.  She is normally alert.  Today, she was weak and was not responding normally, so the sister called EMS.  Pt's RA O2 sat was 88%, so EMS put her on 2L.  O2 sat up to the mid-90s.  Pt does not have any specific complaints, but said she does not feel well.  Pt is jaundiced, but was unaware that she was jaundiced.      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/04/2018   Occult blood in stools 02/04/2018   Ureteral calculus 11/12/2015   Breast cancer screening, high risk patient 08/29/2015   Acute delirium 08/07/2015   Postoperative anemia due to acute blood loss 08/06/2015   Protein-calorie malnutrition, severe 08/04/2015   Closed intertrochanteric fracture of left femur ([place]) 08/03/2015   Fall 08/03/2015   Closed right hip fracture ([place]) 08/03/2015   Hyperbilirubinemia 08/01/2013   Hypokalemia 07/30/2013   Vitamin B 12 deficiency 08/07/2012   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[place]) 06/10/2012   Tobacco abuse 06/10/2012    Past Surgical History:  Procedure Laterality Date   ABDOMINAL HYSTERECTOMY   1986   APPENDECTOMY  1967   BREAST EXCISIONAL BIOPSY Right 1984   CATARACT EXTRACTION W/ INTRAOCULAR LENS IMPLANT Right 1990's   congential cataract   CYSTOSCOPY W/ URETERAL STENT PLACEMENT  11/12/2015   Procedure: CYSTOSCOPY WITH RETROGRADE PYELOGRAM/URETERAL STENT PLACEMENT;  Surgeon: [name], MD;  Location: [facility];  Service: Urology;;   CYSTOSCOPY W/ URETERAL STENT REMOVAL Left 11/22/2015   Procedure: CYSTOSCOPY WITH STENT REMOVAL;  Surgeon: [name], MD;  Location: [facility];  Service: Urology;  Laterality: Left;   CYSTOSCOPY/URETEROSCOPY/HOLMIUM LASER Left 11/22/2015   Procedure: CYSTOSCOPY/URETEROSCOPY/HOLMIUM LASER;  Surgeon: [name], MD;  Location: [facility];  Service: Urology;  Laterality: Left;  FLEX URETEROSCOPY   FEMUR IM NAIL Left 08/04/2015   Procedure: INTRAMEDULLARY (IM) NAIL FEMORAL;  Surgeon: [name], MD;  Location: [facility];  Service: Orthopedics;  Laterality: Left;   IR KYPHO THORACIC WITH BONE BIOPSY  06/22/2018   TONSILLECTOMY  age 45     OB History   No obstetric history on file.     Family History  Problem Relation Age of Onset   Breast cancer Cousin     Social History   Tobacco Use   Smoking status: Former    Packs/day: 1.00    Types: Cigarettes    Quit date: 08/03/2015    Years since  quitting: 6.0   Smokeless tobacco: Never  Vaping Use   Vaping Use: Never used  Substance Use Topics   Alcohol use: Yes    Comment: occ   Drug use: Never    Home Medications Prior to Admission medications   Medication Sig Start Date End Date Taking? Authorizing Provider  acetaminophen (TYLENOL) 325 MG tablet Take 2 tablets (650 mg total) by mouth every 6 (six) hours as needed for mild pain (or Fever >/= 101). 08/08/15   Delfina Redwood, MD  ALPRAZolam Duanne Moron) 0.25 MG tablet Take 0.25 mg by mouth daily. 06/13/21   [provider]  Cyanocobalamin (VITAMIN B-12) 1000 MCG SUBL TAKE 1 TABLET BY MOUTH EVERY  DAY Patient taking differently: Place 1,000 mcg under the tongue daily. 08/09/20   Brunetta Genera, MD  docusate sodium (COLACE) 100 MG capsule Take 1 capsule (100 mg total) by mouth daily. 08/08/15   Delfina Redwood, MD  donepezil (ARICEPT) 5 MG tablet Take 5 mg by mouth 2 (two) times daily. 03/06/21   [provider]  ergocalciferol (VITAMIN D2) 1.25 MG (50000 UT) capsule Take 1 capsule (50,000 Units total) by mouth 2 (two) times a week. 01/21/19   Brunetta Genera, MD  folic acid (FOLVITE) 1 MG tablet TAKE 2 TABLETS BY MOUTH EVERY DAY 05/11/20   Brunetta Genera, MD  furosemide (LASIX) 20 MG tablet Take 20 mg by mouth daily. 04/17/21   [provider]  Multiple Vitamin (MULTIVITAMIN WITH MINERALS) TABS tablet Take 1 tablet by mouth daily. 06/03/18   Mikhail, Velta Addison, DO  Potassium Chloride ER 20 MEQ TBCR Take 20 mEq by mouth daily. 04/17/21   [provider]  senna-docusate (SENOKOT-S) 8.6-50 MG tablet Take 2 tablets by mouth 2 (two) times daily. 06/02/18   Mikhail, Velta Addison, DO  traMADol (ULTRAM) 50 MG tablet Take 1 tablet (50 mg total) by mouth every 8 (eight) hours as needed. Patient not taking: Reported on 08/28/2018 06/02/18   Cristal Ford, DO    Allergies    Other and Shellfish allergy  Review of Systems   Review of Systems  Neurological:  Positive for weakness.  All other systems reviewed and are negative.  Physical Exam Updated Vital Signs BP (!) 122/38   Pulse 95   Temp (!) 96.4 F (35.8 C) (Rectal)   Resp (!) 30   Ht 5\' 1"  (1.549 m)   Wt 38 kg   SpO2 98%   BMI 15.83 kg/m   Physical Exam Vitals and nursing note reviewed.  HENT:     Head: Normocephalic and atraumatic.     Right Ear: External ear normal.     Left Ear: External ear normal.     Nose: Nose normal.     Mouth/Throat:     Mouth: Mucous membranes are dry.  Eyes:     General: Scleral icterus present.     Pupils: Pupils are equal, round, and reactive to light.   Cardiovascular:     Rate and Rhythm: Normal rate and regular rhythm.     Pulses: Normal pulses.     Heart sounds: Normal heart sounds.  Pulmonary:     Effort: Pulmonary effort is normal.     Breath sounds: Normal breath sounds.  Abdominal:     General: Abdomen is flat. Bowel sounds are normal.     Palpations: Abdomen is soft.  Musculoskeletal:        General: Normal range of motion.     Cervical back: Normal  range of motion and neck supple.     Right lower leg: Edema present.     Left lower leg: Edema present.  Skin:    Capillary Refill: Capillary refill takes less than 2 seconds.     Coloration: Skin is jaundiced.     Comments: Wound to left upper arm with some drainage  Neurological:     Mental Status: She is easily aroused.     Comments: Pt is slow to respond and falls asleep easily, but she is oriented  Psychiatric:        Mood and Affect: Mood normal.    ED Results / Procedures / Treatments   Labs (all labs ordered are listed, but only abnormal results are displayed) Labs Reviewed  CBC WITH DIFFERENTIAL/PLATELET - Abnormal; Notable for the following components:      Result Value   WBC 24.3 (*)    RBC 1.27 (*)    Hemoglobin 4.5 (*)    HCT 13.5 (*)    MCV 106.3 (*)    MCH 35.4 (*)    RDW 17.1 (*)    Platelets 655 (*)    nRBC 1.6 (*)    Neutro Abs 19.2 (*)    Eosinophils Absolute 0.6 (*)    Abs Immature Granulocytes 0.41 (*)    All other components within normal limits  COMPREHENSIVE METABOLIC PANEL - Abnormal; Notable for the following components:   Potassium 3.1 (*)    Chloride 97 (*)    Glucose, Bld 141 (*)    BUN 28 (*)    Calcium 7.9 (*)    AST 113 (*)    ALT 61 (*)    Total Bilirubin 5.1 (*)    All other components within normal limits  LACTIC ACID, PLASMA - Abnormal; Notable for the following components:   Lactic Acid, Venous 2.2 (*)    All other components within normal limits  CBG MONITORING, ED - Abnormal; Notable for the following components:    Glucose-Capillary 125 (*)    All other components within normal limits  RESP PANEL BY RT-PCR (FLU A&B, COVID) ARPGX2  CULTURE, BLOOD (ROUTINE X 2)  CULTURE, BLOOD (ROUTINE X 2)  AMMONIA  PROTIME-INR  URINALYSIS, ROUTINE W REFLEX MICROSCOPIC  MAGNESIUM  LACTATE DEHYDROGENASE  RETICULOCYTES  BILIRUBIN, FRACTIONATED(TOT/DIR/INDIR)  POC OCCULT BLOOD, ED  PREPARE RBC (CROSSMATCH)  TYPE AND SCREEN    EKG EKG Interpretation  Date/Time:  Thursday August 09 2021 13:08:32 EST Ventricular Rate:  95 PR Interval:  146 QRS Duration: 101 QT Interval:  383 QTC Calculation: 482 R Axis:   77 Text Interpretation: nsr with pacs Borderline repolarization abnormality Confirmed by Isla Pence [id number]) on 08/09/2021 1:15:24 PM  Radiology CT ABDOMEN PELVIS W CONTRAST  Result Date: 08/09/2021 CLINICAL DATA:  Abdominal pain, jaundice EXAM: CT ABDOMEN AND PELVIS WITH CONTRAST TECHNIQUE: Multidetector CT imaging of the abdomen and pelvis was performed using the standard protocol following bolus administration of intravenous contrast. CONTRAST:  47mL OMNIPAQUE IOHEXOL 350 MG/ML SOLN COMPARISON:  CT abdomen/pelvis 05/28/2018, CT abdomen/pelvis 05/28/2018 FINDINGS: Lower chest: There are bilateral pleural effusions with adjacent atelectasis, left slightly larger than right. The imaged heart is unremarkable. Hepatobiliary: There are no focal liver lesions. The liver surface contour is mildly nodular. There is cholelithiasis without evidence of acute cholecystitis. There is no intra or extrahepatic biliary ductal dilatation. Pancreas: The pancreas is atrophic. There are no focal lesions or contour abnormalities. There is no main pancreatic ductal dilatation or peripancreatic inflammatory change. Spleen: Unremarkable.  Adrenals/Urinary Tract: The adrenals are unremarkable. Slight contour abnormality in the left interpolar region is unchanged going back to 2019. A hypodense lesion in the right lower pole is too  small to characterize but most likely reflects a small cyst. There is a 5 mm nonobstructing left renal stone. There is no hydronephrosis or hydroureter. The bladder is decompressed but grossly unremarkable. Stomach/Bowel: The stomach is unremarkable. There is no evidence of bowel obstruction. There is sigmoid diverticulosis without evidence of acute diverticulitis. There is no evidence of abnormal bowel wall thickening or inflammatory change. Vascular/Lymphatic: There is extensive vascular calcification throughout the nonaneurysmal abdominal aorta. The major branch vessels are patent. The main portal and splenic veins are patent. There is no abdominal or pelvic lymphadenopathy. Reproductive: The uterus is surgically absent. There is a 1.5 cm left adnexal cyst, present since at least 2019. There is no right adnexal mass. Other: There is trace free fluid in the pelvis in the presacral space. There is no upper abdominal ascites. Musculoskeletal: The patient is status post kyphoplasty at T12. There is compression deformity of the T11, L1, L3, L4, and L5 vertebral bodies, overall progressed since 2019, particularly at L1 and L3. There is no evidence of acute fracture or aggressive osseous lesion. Postsurgical changes are partially imaged in the left femur. IMPRESSION: 1. Nodular surface contour of the liver suspicious for cirrhosis. 2. Cholelithiasis without evidence of acute cholecystitis. No biliary ductal dilatation. 3. 5 mm nonobstructing left renal stone. 4. Bilateral pleural effusions. 5. Trace free fluid in the presacral space, nonspecific. 6. Diverticulosis without evidence of acute diverticulitis. 7. Multiple lumbar compression deformities have overall progressed since 2019, particularly at L1 and L3. Post kyphoplasty changes are noted at T12. Aortic Atherosclerosis (ICD10-I70.0). Electronically Signed   By: Valetta Mole M.D.   On: 08/09/2021 14:35   DG Chest Portable 1 View  Result Date:  08/09/2021 CLINICAL DATA:  Shortness of breath EXAM: PORTABLE CHEST 1 VIEW COMPARISON:  01/10/2021 FINDINGS: Patient is slightly rotated. Mild cardiomegaly. Increased opacity at the left lung base may reflect a combination of pleural effusion and consolidation. Right lung field is clear. No pneumothorax. Bony structures appear demineralized. IMPRESSION: Increased opacity at the left lung base may reflect a combination of pleural effusion and consolidation. Electronically Signed   By: Davina Poke D.O.   On: 08/09/2021 14:09    Procedures Procedures   Medications Ordered in ED Medications  bacitracin ointment (has no administration in time range)  0.9 %  sodium chloride infusion (has no administration in time range)  potassium chloride 10 mEq in 100 mL IVPB (10 mEq Intravenous New Bag/Given 08/09/21 1447)  iohexol (OMNIPAQUE) 350 MG/ML injection 80 mL (80 mLs Intravenous Contrast Given 08/09/21 1408)    ED Course  I have reviewed the triage vital signs and the nursing notes.  Pertinent labs & imaging results that were available during my care of the patient were reviewed by me and considered in my medical decision making (see chart for details).    MDM Rules/Calculators/A&P                           Pt's hgb today is down to 4.5. She has a hx of cold agglutinin disease related hemolytic anemia.  Her hgb is normally in the 8 range.  Pt d/w Dr. Irene Limbo (heme-onc) who recommended all fluids and blood to go through a fluid warmer.  Nurse notified.  She also needs to be  kept very warm.  Bair hugger ordered. He does not think she needs additional meds for now unless she does not respond to that.  He or one of his colleagues will see pt in the morning.  K is slightly low at 3.1.  Pt given 10 meq iv.  Magnesium added on.  Pt d/w Dr. Olevia Bowens (triad) for admission.  CRITICAL CARE Performed by: Isla Pence   Total critical care time: 30 minutes  Critical care time was exclusive of  separately billable procedures and treating other patients.  Critical care was necessary to treat or prevent imminent or life-threatening deterioration.  Critical care was time spent personally by me on the following activities: development of treatment plan with patient and/or surrogate as well as nursing, discussions with consultants, evaluation of patient's response to treatment, examination of patient, obtaining history from patient or surrogate, ordering and performing treatments and interventions, ordering and review of laboratory studies, ordering and review of radiographic studies, pulse oximetry and re-evaluation of patient's condition.  Final Clinical Impression(s) / ED Diagnoses Final diagnoses:  Symptomatic anemia  Pleural effusion, bilateral  Hypoxia  Hypokalemia  Cold agglutinin disease (HCC)  Jaundice, hemolytic ([place])    Rx / DC Orders ED Discharge Orders     None        Isla Pence, MD 08/09/21 1549

## 2021-08-09 NOTE — ED Triage Notes (Signed)
Pt from home brought in by ems for ams and weakness. Pt appears jaundice in color.  Per ems, pt has been altered and weak per sister.

## 2021-08-09 NOTE — ED Notes (Signed)
ED TO INPATIENT HANDOFF REPORT  Name/Age/Gender [name] 80 y.o. female  Code Status    Code Status Orders  (From admission, onward)           Start     Ordered   08/09/21 1848  Full code  Continuous        08/09/21 1848           Code Status History     Date Active Date Inactive Code Status Order ID Comments User Context   05/28/2018 1805 06/02/2018 1604 Full Code [id number]  Roxan Hockey, MD Inpatient   02/04/2018 2052 02/05/2018 2119 Full Code [id number]  [name], DO ED   08/04/2015 1842 08/08/2015 2219 Full Code [id number]  [name], PA-C Inpatient   08/03/2015 2006 08/04/2015 1842 Full Code [id number]  [name], MD ED       Home/SNF/Other Home  Chief Complaint 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]  Level of Care/Admitting Diagnosis ED Disposition     ED Disposition  Admit   Condition  --   [place]: [place] [[id number]]  Level of Care: Stepdown [14]  Admit to SDU based on following criteria: Severe physiological/psychological symptoms:  Any diagnosis requiring assessment & intervention at least every 4 hours on an ongoing basis to obtain desired patient outcomes including stability and rehabilitation  May place patient in observation at [facility] or [place] if equivalent level of care is available:: No  Covid Evaluation: Asymptomatic Screening Protocol (No Symptoms)  Diagnosis: 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 [facility]) [[id number]]  Admitting Physician: Reubin Milan [[id number]]  Attending Physician: Reubin Milan [[id number]]          Medical History Past Medical History:  Diagnosis Date   Aortic atherosclerosis ([place]) 08/09/2021   B12 deficiency    Bilateral carotid artery stenosis    right CCA and ICA 1-39%/    left ICA 40-59%  per last duplex  10-19-2015   COPD (chronic obstructive pulmonary disease) ([place])    Diverticulosis of colon    History of  diverticulitis of colon    2004   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[place])    montiored by dr Marko Plume   Renal and ureteric calculus    left    Allergies Allergies  Allergen Reactions   Other Other (See Comments)    "Most antibiotics give yeast infection"   Shellfish Allergy Hives    IV Location/Drains/Wounds Patient Lines/Drains/Airways Status     Active Line/Drains/Airways     Name Placement date Placement time Site Days   Peripheral IV 08/09/21 20 G Left Antecubital 08/09/21  1241  Antecubital  less than 1   Incision (Closed) 08/04/15 Hip Left 08/04/15  1636  -- 2197   Incision (Closed) 11/22/15 Perineum 11/22/15  1004  -- 2087            Labs/Imaging Results for orders placed or performed during the hospital encounter of 08/09/21 (from the past 48 hour(s))  CBC with Differential     Status: Abnormal   Collection Time: 08/09/21 12:46 PM  Result Value Ref Range   WBC 24.3 (H) 4.0 - 10.5 K/uL   RBC 1.27 (L) 3.87 - 5.11 MIL/uL   Hemoglobin 4.5 (LL) 12.0 - 15.0 g/dL    Comment: REPEATED TO VERIFY THIS CRITICAL RESULT HAS VERIFIED AND BEEN CALLED TO A.BINFANG, RN BY NATHAN THOMPSON ON 11 17 2022 AT 1421, AND HAS BEEN READ  BACK. CRITICAL RESULT VERIFIED    HCT 13.5 (L) 36.0 - 46.0 %   MCV 106.3 (H) 80.0 - 100.0 fL   MCH 35.4 (H) 26.0 - 34.0 pg   MCHC 33.3 30.0 - 36.0 g/dL   RDW 17.1 (H) 11.5 - 15.5 %   Platelets 655 (H) 150 - 400 K/uL   nRBC 1.6 (H) 0.0 - 0.2 %   Neutrophils Relative % 80 %   Neutro Abs 19.2 (H) 1.7 - 7.7 K/uL   Lymphocytes Relative 13 %   Lymphs Abs 3.2 0.7 - 4.0 K/uL   Monocytes Relative 3 %   Monocytes Absolute 0.8 0.1 - 1.0 K/uL   Eosinophils Relative 2 %   Eosinophils Absolute 0.6 (H) 0.0 - 0.5 K/uL   Basophils Relative 0 %   Basophils Absolute 0.1 0.0 - 0.1 K/uL   Immature Granulocytes 2 %   Abs Immature Granulocytes 0.41 (H) 0.00 - 0.07 K/uL    Comment: Performed at [facility], [place] [address],  [place], [place] [address]  Comprehensive metabolic panel     Status: Abnormal   Collection Time: 08/09/21 12:46 PM  Result Value Ref Range   Sodium 137 135 - 145 mmol/L   Potassium 3.1 (L) 3.5 - 5.1 mmol/L   Chloride 97 (L) 98 - 111 mmol/L   CO2 28 22 - 32 mmol/L   Glucose, Bld 141 (H) 70 - 99 mg/dL    Comment: Glucose reference range applies only to samples taken after fasting for at least 8 hours.   BUN 28 (H) 8 - 23 mg/dL   Creatinine, Ser 0.80 0.44 - 1.00 mg/dL   Calcium 7.9 (L) 8.9 - 10.3 mg/dL   Total Protein 6.7 6.5 - 8.1 g/dL   Albumin 3.7 3.5 - 5.0 g/dL   AST 113 (H) 15 - 41 U/L   ALT 61 (H) 0 - 44 U/L   Alkaline Phosphatase 90 38 - 126 U/L   Total Bilirubin 5.1 (H) 0.3 - 1.2 mg/dL   GFR, Estimated >60 >60 mL/min    Comment: (NOTE) Calculated using the CKD-EPI Creatinine Equation (2021)    Anion gap 12 5 - 15    Comment: Performed at [facility], [place] [address], [place], [place] [address]  Urinalysis, Routine w reflex microscopic Urine, Clean Catch     Status: Abnormal   Collection Time: 08/09/21 12:46 PM  Result Value Ref Range   Color, Urine YELLOW YELLOW   APPearance CLEAR CLEAR   Specific Gravity, Urine >1.046 (H) 1.005 - 1.030   pH 5.0 5.0 - 8.0   Glucose, UA NEGATIVE NEGATIVE mg/dL   Hgb urine dipstick SMALL (A) NEGATIVE   Bilirubin Urine NEGATIVE NEGATIVE   Ketones, ur 5 (A) NEGATIVE mg/dL   Protein, ur NEGATIVE NEGATIVE mg/dL   Nitrite NEGATIVE NEGATIVE   Leukocytes,Ua NEGATIVE NEGATIVE   RBC / HPF 0-5 0 - 5 RBC/hpf   WBC, UA 0-5 0 - 5 WBC/hpf   Bacteria, UA NONE SEEN NONE SEEN   Squamous Epithelial / LPF 0-5 0 - 5    Comment: Performed at [facility], [place] [address] [place], [place] [address]  Ammonia     Status: None   Collection Time: 08/09/21 12:46 PM  Result Value Ref Range   Ammonia 34 9 - 35 umol/L    Comment: Performed at [facility], [place] [address] [place], [place] [address]  Lactic  acid, plasma  Status: Abnormal   Collection Time: 08/09/21 12:46 PM  Result Value Ref Range   Lactic Acid, Venous 2.2 (HH) 0.5 - 1.9 mmol/L    Comment: CRITICAL RESULT CALLED TO, READ BACK BY AND VERIFIED WITHFrederich Chick RN AT 1411 08/09/21 MULLINS,T Performed at [facility], [place] [address], [place], [place] [address]   Protime-INR     Status: None   Collection Time: 08/09/21 12:46 PM  Result Value Ref Range   Prothrombin Time 15.2 11.4 - 15.2 seconds   INR 1.2 0.8 - 1.2    Comment: (NOTE) INR goal varies based on device and disease states. Performed at [facility], [place] [address] [place], [place] [address]   Resp Panel by RT-PCR (Flu A&B, Covid) Nasopharyngeal Swab     Status: None   Collection Time: 08/09/21 12:47 PM   Specimen: Nasopharyngeal Swab; Nasopharyngeal(NP) swabs in vial transport medium  Result Value Ref Range   SARS Coronavirus 2 by RT PCR NEGATIVE NEGATIVE    Comment: (NOTE) SARS-CoV-2 target nucleic acids are NOT DETECTED.  The SARS-CoV-2 RNA is generally detectable in upper respiratory specimens during the acute phase of infection. The lowest concentration of SARS-CoV-2 viral copies this assay can detect is 138 copies/mL. A negative result does not preclude SARS-Cov-2 infection and should not be used as the sole basis for treatment or other patient management decisions. A negative result may occur with  improper specimen collection/handling, submission of specimen other than nasopharyngeal swab, presence of viral mutation(s) within the areas targeted by this assay, and inadequate number of viral copies(<138 copies/mL). A negative result must be combined with clinical observations, patient history, and epidemiological information. The expected result is Negative.  Fact Sheet for Patients:  EntrepreneurPulse.com.au  Fact Sheet for Healthcare Providers:   IncredibleEmployment.be  This test is no t yet approved or cleared by the [place] FDA and  has been authorized for detection and/or diagnosis of SARS-CoV-2 by FDA under an Emergency Use Authorization (EUA). This EUA will remain  in effect (meaning this test can be used) for the duration of the COVID-19 declaration under Section 564(b)(1) of the Act, 21 U.S.C.section 360bbb-3(b)(1), unless the authorization is terminated  or revoked sooner.       Influenza A by PCR NEGATIVE NEGATIVE   Influenza B by PCR NEGATIVE NEGATIVE    Comment: (NOTE) The Xpert Xpress SARS-CoV-2/FLU/RSV plus assay is intended as an aid in the diagnosis of influenza from Nasopharyngeal swab specimens and should not be used as a sole basis for treatment. Nasal washings and aspirates are unacceptable for Xpert Xpress SARS-CoV-2/FLU/RSV testing.  Fact Sheet for Patients: EntrepreneurPulse.com.au  Fact Sheet for Healthcare Providers: IncredibleEmployment.be  This test is not yet approved or cleared by the [place] FDA and has been authorized for detection and/or diagnosis of SARS-CoV-2 by FDA under an Emergency Use Authorization (EUA). This EUA will remain in effect (meaning this test can be used) for the duration of the COVID-19 declaration under Section 564(b)(1) of the Act, 21 U.S.C. section 360bbb-3(b)(1), unless the authorization is terminated or revoked.  Performed at [facility], [place] [address] [place], [place] [address]   Reticulocytes     Status: Abnormal   Collection Time: 08/09/21  1:24 PM  Result Value Ref Range   Retic Ct Pct 9.3 (H) 0.4 - 3.1 %   RBC. 1.31 (L) 3.87 - 5.11 MIL/uL   Retic Count, Absolute 121.7 19.0 - 186.0 K/uL   Immature Retic Fract  39.3 (H) 2.3 - 15.9 %    Comment: Performed at [facility], [place] [address], [place], [place] [address]  CBG monitoring, ED     Status:  Abnormal   Collection Time: 08/09/21  1:26 PM  Result Value Ref Range   Glucose-Capillary 125 (H) 70 - 99 mg/dL    Comment: Glucose reference range applies only to samples taken after fasting for at least 8 hours.  Prepare RBC (crossmatch)     Status: None   Collection Time: 08/09/21  2:25 PM  Result Value Ref Range   Order Confirmation      ORDER PROCESSED BY BLOOD BANK Performed at [facility], [place] [address] [place], [place] [address]   POC occult blood, ED Provider will collect     Status: None   Collection Time: 08/09/21  2:43 PM  Result Value Ref Range   Fecal Occult Bld NEGATIVE NEGATIVE  Type and screen [place]     Status: None (Preliminary result)   Collection Time: 08/09/21  3:10 PM  Result Value Ref Range   ABO/RH(D) B POS    Antibody Screen NEG    Sample Expiration 08/12/2021,2359    Antibody Identification ANTI I    Unit Number [id number]    Blood Component Type RED CELLS,LR    Unit division 00    Status of Unit ALLOCATED    Transfusion Status OK TO TRANSFUSE    Crossmatch Result COMPATIBLE   Magnesium     Status: None   Collection Time: 08/09/21  5:14 PM  Result Value Ref Range   Magnesium 1.7 1.7 - 2.4 mg/dL    Comment: NO VISIBLE HEMOLYSIS PRE-WARMING TECHNIQUE USED Performed at [place] [address], [name], [place] [address]   Lactate dehydrogenase     Status: Abnormal   Collection Time: 08/09/21  5:14 PM  Result Value Ref Range   LDH 657 (H) 98 - 192 U/L    Comment: NO VISIBLE HEMOLYSIS PRE-WARMING TECHNIQUE USED Performed at [place] [address] [place], [place] [address]   Bilirubin, fractionated(tot/dir/indir)     Status: Abnormal   Collection Time: 08/09/21  5:14 PM  Result Value Ref Range   Total Bilirubin 3.8 (H) 0.3 - 1.2 mg/dL    Comment: NO VISIBLE HEMOLYSIS PRE-WARMING TECHNIQUE USED    Bilirubin, Direct 0.7 (H) 0.0 - 0.2 mg/dL     Comment: NO VISIBLE HEMOLYSIS PRE-WARMING TECHNIQUE USED    Indirect Bilirubin 3.1 (H) 0.3 - 0.9 mg/dL    Comment: NO VISIBLE HEMOLYSIS PRE-WARMING TECHNIQUE USED Performed at [place] [address], [place], [place] [address]   Lactate dehydrogenase     Status: Abnormal   Collection Time: 08/09/21  5:14 PM  Result Value Ref Range   LDH 699 (H) 98 - 192 U/L    Comment: NO VISIBLE HEMOLYSIS PRE-WARMING TECHNIQUE USED Performed at [place] [address], [place], [place] [address]   Reticulocytes     Status: Abnormal   Collection Time: 08/09/21  5:14 PM  Result Value Ref Range   Retic Ct Pct 11.2 (H) 0.4 - 3.1 %   RBC. 1.27 (L) 3.87 - 5.11 MIL/uL   Retic Count, Absolute 142.0 19.0 - 186.0 K/uL   Immature Retic Fract 42.1 (H) 2.3 - 15.9 %    Comment: Performed at [facility], [place] [address], [place],  [address]  CBC with Differential  Status: Abnormal   Collection Time: 08/09/21  5:14 PM  Result Value Ref Range   WBC 22.9 (H) 4.0 - 10.5 K/uL   RBC 1.27 (L) 3.87 - 5.11 MIL/uL   Hemoglobin 4.3 (LL) 12.0 - 15.0 g/dL    Comment: CRITICAL VALUE NOTED.  VALUE IS CONSISTENT WITH PREVIOUSLY REPORTED AND CALLED VALUE. REPEATED TO VERIFY    HCT 13.8 (L) 36.0 - 46.0 %   MCV 108.7 (H) 80.0 - 100.0 fL   MCH 33.9 26.0 - 34.0 pg   MCHC 31.2 30.0 - 36.0 g/dL   RDW 17.2 (H) 11.5 - 15.5 %   Platelets 528 (H) 150 - 400 K/uL   nRBC 2.0 (H) 0.0 - 0.2 %   Neutrophils Relative % 80 %   Neutro Abs 18.3 (H) 1.7 - 7.7 K/uL   Lymphocytes Relative 13 %   Lymphs Abs 2.9 0.7 - 4.0 K/uL   Monocytes Relative 5 %   Monocytes Absolute 1.2 (H) 0.1 - 1.0 K/uL   Eosinophils Relative 0 %   Eosinophils Absolute 0.1 0.0 - 0.5 K/uL   Basophils Relative 0 %   Basophils Absolute 0.1 0.0 - 0.1 K/uL   Immature Granulocytes 2 %   Abs Immature Granulocytes 0.37 (H) 0.00 - 0.07 K/uL    Comment: Performed at [facility], [place] [address], [place], [place] [address]   CT ABDOMEN PELVIS W CONTRAST  Result Date: 08/09/2021 CLINICAL DATA:  Abdominal pain, jaundice EXAM: CT ABDOMEN AND PELVIS WITH CONTRAST TECHNIQUE: Multidetector CT imaging of the abdomen and pelvis was performed using the standard protocol following bolus administration of intravenous contrast. CONTRAST:  110mL OMNIPAQUE IOHEXOL 350 MG/ML SOLN COMPARISON:  CT abdomen/pelvis 05/28/2018, CT abdomen/pelvis 05/28/2018 FINDINGS: Lower chest: There are bilateral pleural effusions with adjacent atelectasis, left slightly larger than right. The imaged heart is unremarkable. Hepatobiliary: There are no focal liver lesions. The liver surface contour is mildly nodular. There is cholelithiasis without evidence of acute cholecystitis. There is no intra or extrahepatic biliary ductal dilatation. Pancreas: The pancreas is atrophic. There are no focal lesions or contour abnormalities. There is no main pancreatic ductal dilatation or peripancreatic inflammatory change. Spleen: Unremarkable. Adrenals/Urinary Tract: The adrenals are unremarkable. Slight contour abnormality in the left interpolar region is unchanged going back to 2019. A hypodense lesion in the right lower pole is too small to characterize but most likely reflects a small cyst. There is a 5 mm nonobstructing left renal stone. There is no hydronephrosis or hydroureter. The bladder is decompressed but grossly unremarkable. Stomach/Bowel: The stomach is unremarkable. There is no evidence of bowel obstruction. There is sigmoid diverticulosis without evidence of acute diverticulitis. There is no evidence of abnormal bowel wall thickening or inflammatory change. Vascular/Lymphatic: There is extensive vascular calcification throughout the nonaneurysmal abdominal aorta. The major branch vessels are patent. The main portal and splenic veins are patent. There is no abdominal or pelvic lymphadenopathy. Reproductive: The  uterus is surgically absent. There is a 1.5 cm left adnexal cyst, present since at least 2019. There is no right adnexal mass. Other: There is trace free fluid in the pelvis in the presacral space. There is no upper abdominal ascites. Musculoskeletal: The patient is status post kyphoplasty at T12. There is compression deformity of the T11, L1, L3, L4, and L5 vertebral bodies, overall progressed since 2019, particularly at L1 and L3. There is no evidence of acute fracture or aggressive osseous lesion. Postsurgical changes are partially imaged in the left femur.  IMPRESSION: 1. Nodular surface contour of the liver suspicious for cirrhosis. 2. Cholelithiasis without evidence of acute cholecystitis. No biliary ductal dilatation. 3. 5 mm nonobstructing left renal stone. 4. Bilateral pleural effusions. 5. Trace free fluid in the presacral space, nonspecific. 6. Diverticulosis without evidence of acute diverticulitis. 7. Multiple lumbar compression deformities have overall progressed since 2019, particularly at L1 and L3. Post kyphoplasty changes are noted at T12. Aortic Atherosclerosis (ICD10-I70.0). Electronically Signed   By: Valetta Mole M.D.   On: 08/09/2021 14:35   DG Chest Portable 1 View  Result Date: 08/09/2021 CLINICAL DATA:  Shortness of breath EXAM: PORTABLE CHEST 1 VIEW COMPARISON:  01/10/2021 FINDINGS: Patient is slightly rotated. Mild cardiomegaly. Increased opacity at the left lung base may reflect a combination of pleural effusion and consolidation. Right lung field is clear. No pneumothorax. Bony structures appear demineralized. IMPRESSION: Increased opacity at the left lung base may reflect a combination of pleural effusion and consolidation. Electronically Signed   By: Davina Poke D.O.   On: 08/09/2021 14:09    Pending Labs Unresulted Labs (From admission, onward)     Start     Ordered   08/10/21 0500  CBC with Differential  Tomorrow morning,   R        08/09/21 1844   08/10/21 0500   Comprehensive metabolic panel  Tomorrow morning,   R        08/09/21 1844   08/09/21 1931  Legionella Pneumophila Serogp 1 Ur Ag  Once,   R        08/09/21 1931   08/09/21 1931  Strep pneumoniae urinary antigen  Once,   R        08/09/21 1931   08/09/21 1931  Expectorated Sputum Assessment w Gram Stain, Rflx to Resp Cult  Once,   R        08/09/21 1931   08/09/21 1843  Lactic acid, plasma  STAT Now then every 2 hours,   STAT      08/09/21 1844   08/09/21 1843  Protime-INR  ONCE - STAT,   STAT        08/09/21 1844   08/09/21 1843  APTT  ONCE - STAT,   STAT        08/09/21 1844   08/09/21 1843  Procalcitonin  ONCE - STAT,   STAT        08/09/21 1844   08/09/21 1246  Culture, blood (routine x 2)  BLOOD CULTURE X 2,   STAT      08/09/21 1246            Vitals/Pain Today's Vitals   08/09/21 1600 08/09/21 1725 08/09/21 1730 08/09/21 1800  BP: (!) 114/48  (!) 125/44 103/83  Pulse: 87  (!) 107 (!) 102  Resp: 18  15 20   Temp:  (!) 101.6 F (38.7 C)    TempSrc:  Rectal    SpO2: 100%  95% 95%  Weight:      Height:      PainSc:        Isolation Precautions No active isolations  Medications Medications  bacitracin ointment (0 application Topical Hold 08/09/21 1850)  0.9 %  sodium chloride infusion (has no administration in time range)  lactated ringers bolus 1,000 mL (has no administration in time range)    And  lactated ringers bolus 250 mL (has no administration in time range)  azithromycin (ZITHROMAX) 500 mg in sodium chloride 0.9 % 250 mL IVPB (has no  administration in time range)  potassium chloride SA (KLOR-CON) CR tablet 40 mEq (has no administration in time range)  magnesium sulfate IVPB 1 g 100 mL (has no administration in time range)  cefTRIAXone (ROCEPHIN) 1 g in sodium chloride 0.9 % 100 mL IVPB (has no administration in time range)  folic acid (FOLVITE) tablet 2 mg (has no administration in time range)  vitamin B-12 (CYANOCOBALAMIN) tablet 2,000 mcg (has no  administration in time range)  potassium chloride 10 mEq in 100 mL IVPB (0 mEq Intravenous Stopped 08/09/21 1547)  iohexol (OMNIPAQUE) 350 MG/ML injection 80 mL (80 mLs Intravenous Contrast Given 08/09/21 1408)    Mobility non-ambulatory

## 2021-08-09 NOTE — Progress Notes (Signed)
Patient arrived to the unit, VSS. Alert to self, denies any pain.   Blood has not been requested from blood bank, because we are waiting on the blood warmer. Charge RN aware, Financial risk analyst aware.

## 2021-08-09 NOTE — ED Notes (Signed)
.  wled

## 2021-08-09 NOTE — ED Notes (Signed)
Pt placed on purewick 

## 2021-08-10 ENCOUNTER — Inpatient Hospital Stay (HOSPITAL_COMMUNITY): Payer: Medicare Other

## 2021-08-10 DIAGNOSIS — J189 Pneumonia, unspecified organism: Secondary | ICD-10-CM | POA: Diagnosis present

## 2021-08-10 DIAGNOSIS — R824 Acetonuria: Secondary | ICD-10-CM | POA: Diagnosis present

## 2021-08-10 DIAGNOSIS — Z87442 Personal history of urinary calculi: Secondary | ICD-10-CM | POA: Diagnosis not present

## 2021-08-10 DIAGNOSIS — G928 Other toxic encephalopathy: Secondary | ICD-10-CM | POA: Diagnosis present

## 2021-08-10 DIAGNOSIS — Z20822 Contact with and (suspected) exposure to covid-19: Secondary | ICD-10-CM | POA: Diagnosis present

## 2021-08-10 DIAGNOSIS — A419 Sepsis, unspecified organism: Secondary | ICD-10-CM | POA: Diagnosis present

## 2021-08-10 DIAGNOSIS — Z961 Presence of intraocular lens: Secondary | ICD-10-CM | POA: Diagnosis present

## 2021-08-10 DIAGNOSIS — J440 Chronic obstructive pulmonary disease with acute lower respiratory infection: Secondary | ICD-10-CM | POA: Diagnosis present

## 2021-08-10 DIAGNOSIS — J811 Chronic pulmonary edema: Secondary | ICD-10-CM | POA: Diagnosis present

## 2021-08-10 DIAGNOSIS — E876 Hypokalemia: Secondary | ICD-10-CM | POA: Diagnosis present

## 2021-08-10 DIAGNOSIS — I700 Atherosclerosis of aorta: Secondary | ICD-10-CM | POA: Diagnosis present

## 2021-08-10 DIAGNOSIS — R823 Hemoglobinuria: Secondary | ICD-10-CM | POA: Diagnosis present

## 2021-08-10 DIAGNOSIS — E872 Acidosis, unspecified: Secondary | ICD-10-CM | POA: Diagnosis present

## 2021-08-10 DIAGNOSIS — F0390 Unspecified dementia without behavioral disturbance: Secondary | ICD-10-CM | POA: Diagnosis present

## 2021-08-10 DIAGNOSIS — R7401 Elevation of levels of liver transaminase levels: Secondary | ICD-10-CM

## 2021-08-10 DIAGNOSIS — J9601 Acute respiratory failure with hypoxia: Secondary | ICD-10-CM | POA: Diagnosis present

## 2021-08-10 DIAGNOSIS — Z87891 Personal history of nicotine dependence: Secondary | ICD-10-CM | POA: Diagnosis not present

## 2021-08-10 DIAGNOSIS — Z66 Do not resuscitate: Secondary | ICD-10-CM | POA: Diagnosis present

## 2021-08-10 DIAGNOSIS — Z90710 Acquired absence of both cervix and uterus: Secondary | ICD-10-CM | POA: Diagnosis not present

## 2021-08-10 DIAGNOSIS — D5912 Cold autoimmune hemolytic anemia: Secondary | ICD-10-CM | POA: Diagnosis present

## 2021-08-10 DIAGNOSIS — Z9049 Acquired absence of other specified parts of digestive tract: Secondary | ICD-10-CM | POA: Diagnosis not present

## 2021-08-10 DIAGNOSIS — Z9841 Cataract extraction status, right eye: Secondary | ICD-10-CM | POA: Diagnosis not present

## 2021-08-10 DIAGNOSIS — E538 Deficiency of other specified B group vitamins: Secondary | ICD-10-CM | POA: Diagnosis present

## 2021-08-10 DIAGNOSIS — J90 Pleural effusion, not elsewhere classified: Secondary | ICD-10-CM | POA: Diagnosis present

## 2021-08-10 DIAGNOSIS — K8020 Calculus of gallbladder without cholecystitis without obstruction: Secondary | ICD-10-CM | POA: Diagnosis present

## 2021-08-10 LAB — COMPREHENSIVE METABOLIC PANEL
ALT: 39 U/L (ref 0–44)
AST: 55 U/L — ABNORMAL HIGH (ref 15–41)
Albumin: 3 g/dL — ABNORMAL LOW (ref 3.5–5.0)
Alkaline Phosphatase: 94 U/L (ref 38–126)
Anion gap: 13 (ref 5–15)
BUN: 25 mg/dL — ABNORMAL HIGH (ref 8–23)
CO2: 25 mmol/L (ref 22–32)
Calcium: 7.7 mg/dL — ABNORMAL LOW (ref 8.9–10.3)
Chloride: 100 mmol/L (ref 98–111)
Creatinine, Ser: 0.65 mg/dL (ref 0.44–1.00)
GFR, Estimated: >60 mL/min (ref >60)
Glucose, Bld: 120 mg/dL — ABNORMAL HIGH (ref 70–99)
Potassium: 3.6 mmol/L (ref 3.5–5.1)
Sodium: 138 mmol/L (ref 135–145)
Total Bilirubin: 3.3 mg/dL — ABNORMAL HIGH (ref 0.3–1.2)
Total Protein: 5.7 g/dL — ABNORMAL LOW (ref 6.5–8.1)

## 2021-08-10 LAB — BPAM RBC
Blood Product Expiration Date: 202212162359
Blood Product Expiration Date: 202212162359
ISSUE DATE / TIME: 202211172207
ISSUE DATE / TIME: 202211172351
Unit Type and Rh: 5100
Unit Type and Rh: 5100

## 2021-08-10 LAB — HEMOGLOBIN AND HEMATOCRIT, BLOOD
HCT: 27.9 % — ABNORMAL LOW (ref 36.0–46.0)
Hemoglobin: 9.3 g/dL — ABNORMAL LOW (ref 12.0–15.0)

## 2021-08-10 LAB — TYPE AND SCREEN
ABO/RH(D): B POS
Antibody Screen: NEGATIVE
Unit division: 0
Unit division: 0

## 2021-08-10 LAB — CBC WITH DIFFERENTIAL/PLATELET
Abs Immature Granulocytes: 0.41 10*3/uL — ABNORMAL HIGH (ref 0.00–0.07)
Basophils Absolute: 0.1 10*3/uL (ref 0.0–0.1)
Basophils Relative: 1 %
Eosinophils Absolute: 0.1 10*3/uL (ref 0.0–0.5)
Eosinophils Relative: 0 %
HCT: 26.7 % — ABNORMAL LOW (ref 36.0–46.0)
Hemoglobin: 9.5 g/dL — ABNORMAL LOW (ref 12.0–15.0)
Immature Granulocytes: 2 %
Lymphocytes Relative: 11 %
Lymphs Abs: 2.4 10*3/uL (ref 0.7–4.0)
MCH: 36.1 pg — ABNORMAL HIGH (ref 26.0–34.0)
MCHC: 35.6 g/dL (ref 30.0–36.0)
MCV: 101.5 fL — ABNORMAL HIGH (ref 80.0–100.0)
Monocytes Absolute: 1.2 10*3/uL — ABNORMAL HIGH (ref 0.1–1.0)
Monocytes Relative: 6 %
Neutro Abs: 17.6 10*3/uL — ABNORMAL HIGH (ref 1.7–7.7)
Neutrophils Relative %: 80 %
Platelets: 506 10*3/uL — ABNORMAL HIGH (ref 150–400)
RBC: 2.63 MIL/uL — ABNORMAL LOW (ref 3.87–5.11)
RDW: 21.8 % — ABNORMAL HIGH (ref 11.5–15.5)
WBC: 21.8 10*3/uL — ABNORMAL HIGH (ref 4.0–10.5)
nRBC: 2 % — ABNORMAL HIGH (ref 0.0–0.2)

## 2021-08-10 LAB — STREP PNEUMONIAE URINARY ANTIGEN: Strep Pneumo Urinary Antigen: NEGATIVE

## 2021-08-10 LAB — GLUCOSE, CAPILLARY
Glucose-Capillary: 128 mg/dL — ABNORMAL HIGH (ref 70–99)
Glucose-Capillary: 133 mg/dL — ABNORMAL HIGH (ref 70–99)
Glucose-Capillary: 111 mg/dL — ABNORMAL HIGH (ref 70–99)

## 2021-08-10 MED ORDER — HYDROCERIN EX CREA
TOPICAL_CREAM | Freq: Every day | CUTANEOUS | Status: DC
Start: 2021-08-10 — End: 2021-08-21
  Administered 2021-08-13 – 2021-08-19 (×3): 1 via TOPICAL

## 2021-08-10 MED ORDER — HYDROCERIN EX CREA
TOPICAL_CREAM | Freq: Every day | CUTANEOUS | Status: DC
  Filled 2021-08-10: qty 113

## 2021-08-10 MED ORDER — FUROSEMIDE 10 MG/ML IJ SOLN
40.0000 mg | Freq: Once | INTRAMUSCULAR | Status: AC
  Administered 2021-08-10: 40 mg via INTRAVENOUS
  Filled 2021-08-10: qty 4

## 2021-08-10 MED ORDER — HALOPERIDOL LACTATE 5 MG/ML IJ SOLN
5.0000 mg | Freq: Four times a day (QID) | INTRAMUSCULAR | Status: DC | PRN
  Administered 2021-08-10 – 2021-08-18 (×11): 5 mg via INTRAVENOUS
  Filled 2021-08-10 (×11): qty 1

## 2021-08-10 MED ORDER — ALPRAZOLAM 0.25 MG PO TABS
0.2500 mg | ORAL_TABLET | Freq: Once | ORAL | Status: AC
  Administered 2021-08-10: 0.25 mg via ORAL
  Filled 2021-08-10: qty 1

## 2021-08-10 MED ORDER — MORPHINE SULFATE (PF) 2 MG/ML IV SOLN
1.0000 mg | INTRAVENOUS | Status: DC | PRN
  Administered 2021-08-10 – 2021-08-14 (×6): 1 mg via INTRAVENOUS
  Filled 2021-08-10 (×6): qty 1

## 2021-08-10 MED ORDER — COLLAGENASE 250 UNIT/GM EX OINT
TOPICAL_OINTMENT | Freq: Every day | CUTANEOUS | Status: DC
  Filled 2021-08-10: qty 30

## 2021-08-10 MED ORDER — HALOPERIDOL LACTATE 5 MG/ML IJ SOLN
5.0000 mg | Freq: Once | INTRAMUSCULAR | Status: AC
  Administered 2021-08-10: 5 mg via INTRAVENOUS
  Filled 2021-08-10: qty 1

## 2021-08-10 NOTE — Progress Notes (Signed)
PROGRESS NOTE    [name]  PXT:[id number] DOB: [birth year] DOA: 08/09/2021 PCP: [name], MD    Brief Narrative:  80 y.o. female with medical history significant of B12 deficiency, bilateral carotid artery gnosis, COPD, diverticulosis/diverticulitis of colon, COPD, former smoker, urolithiasis of the left kidney and ureter, history of idiopathic chronic cold agglutinin disease who was brought to the emergency department via EMS due to decreased mentation, generalized weakness and jaundice.  EMS found the patient to be hypoxic 88% on room air and placed her on supplemental oxygen.  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 Transfused warmed PRBC overnight with appropriate correction Continued on cyanocobalamin and folate. Hematology consulted, recommendations to continue fluids run through warmer, transfuse for hgb <7   Active Problems:   Sepsis due to undetermined organism possibly secondary to PNA at time of presentation Sepsis criteria met: Tachypnea, tachycardia, fever. Leukocytosis, lactic acidosis -CXR reviewed, findings of increased opacity at L lung base may reflect combination of pleural effusion and consolidation Continue PRBC transfusions and IV fluids. Follow-up blood culture and sensitivity. -Currently continued on azithromycin and rocephin     Hypokalemia Replaced Magnesium was supplemented. Recheck bmet in AM      COPD (chronic obstructive pulmonary disease) ([place]) Supplemental oxygen as needed. Continue with bronchodilators as needed.     Elevated  transaminase level   Hyperbilirubinemia Suspect in the setting of hemolytic anemia. Recheck cmp in AM     Aortic atherosclerosis  Not on medical therapy. Will defer starting statin at the moment. Follow-up with PCP as an outpatient.  Toxic metabolic encephalopathy -Suspect related to presenting acute illness -Had required mits overnight -Will give trial of PRN haldol. EKG reviewed, QTc unremarkable   DVT prophylaxis: SCD's Code Status: Full Family Communication: Pt in room, family not at bedside  Status is: Observation  The patient will require care spanning > 2 midnights and should be moved to inpatient because: Severity of illness   Consultants:  PCCM Oncology  Procedures:    Antimicrobials: Anti-infectives (From admission, onward)    Start     Dose/Rate Route Frequency Ordered Stop   08/09/21 2000  cefTRIAXone (ROCEPHIN) 2 g in sodium chloride 0.9 % 100 mL IVPB  Status:  Discontinued        2 g 200 mL/hr over 30 Minutes Intravenous Every 24 hours 08/09/21 1844 08/09/21 1931   08/09/21 2000  azithromycin (ZITHROMAX) 500 mg in sodium chloride 0.9 % 250 mL IVPB        500 mg 250 mL/hr over 60 Minutes Intravenous Every 24 hours 08/09/21 1844 08/13/21 2259   08/09/21 2000  cefTRIAXone (ROCEPHIN) 1 g in sodium chloride 0.9 % 100 mL IVPB        1 g 200 mL/hr over 30 Minutes Intravenous Every 24 hours 08/09/21 1931 08/13/21 2159       Subjective: Difficult to assess given mentation  Objective: Vitals:   08/10/21 0800 08/10/21 0900 08/10/21 1000 08/10/21 1100  BP: (!) 151/45 (!) 137/45 (!) 144/47 (!) 120/25  Pulse: 93 84 84 74  Resp: (!) 23 (!) 24 18 13  Temp:      TempSrc:      SpO2: 100% 100% 99% 100%  Weight:      Height:        Intake/Output Summary (Last 24 hours) at 08/10/2021 1223 Last data filed at 08/10/2021 0600 Gross per 24 hour  Intake 1492.77 ml  Output 25 ml  Net 1467.77 ml   Filed Weights   08/09/21 1244 08/09/21 2107  Weight: 38 kg 35  kg    Examination: General exam: Awake, laying in bed, in nad Respiratory system: Normal respiratory effort, no wheezing Cardiovascular system: regular rate, s1, s2 Gastrointestinal system: Soft, nondistended, positive BS Central nervous system: CN2-12 grossly intact, strength intact Extremities: Perfused, no clubbing Skin: Normal skin turgor, no notable skin lesions seen Psychiatry: difficult to assess given mentation  Data Reviewed: I have personally reviewed following labs and imaging studies  CBC: Recent Labs  Lab 08/09/21 1246 08/09/21 1714 08/10/21 0247 08/10/21 0441  WBC 24.3* 22.9* 21.8*  --   NEUTROABS 19.2* 18.3* 17.6*  --   HGB 4.5* 4.3* 9.5* 9.3*  HCT 13.5* 13.8* 26.7* 27.9*  MCV 106.3* 108.7* 101.5*  --   PLT 655* 528* 506*  --    Basic Metabolic Panel: Recent Labs  Lab 08/09/21 1246 08/09/21 1714 08/10/21 0247  NA 137  --  138  K 3.1*  --  3.6  CL 97*  --  100  CO2 28  --  25  GLUCOSE 141*  --  120*  BUN 28*  --  25*  CREATININE 0.80  --  0.65  CALCIUM 7.9*  --  7.7*  MG  --  1.7  --    GFR: Estimated Creatinine Clearance: 31.5 mL/min (by C-G formula based on SCr of 0.65 mg/dL). Liver Function Tests: Recent Labs  Lab 08/09/21 1246 08/09/21 1714 08/10/21 0247  AST 113*  --  55*  ALT 61*  --  39  ALKPHOS 90  --  94  BILITOT 5.1* 3.8* 3.3*  PROT 6.7  --  5.7*  ALBUMIN 3.7  --  3.0*   No results for input(s): LIPASE, AMYLASE in the last 168 hours. Recent Labs  Lab 08/09/21 1246  AMMONIA 34   Coagulation Profile: Recent Labs  Lab 08/09/21 1246 08/09/21 1843  INR 1.2 1.3*   Cardiac Enzymes: No results for input(s): CKTOTAL, CKMB, CKMBINDEX, TROPONINI in the last 168 hours. BNP (last 3 results) No results for input(s): PROBNP in the last 8760 hours. HbA1C: No results for input(s): HGBA1C in the last 72 hours. CBG: Recent Labs  Lab 08/09/21 1326 08/09/21 2312 08/10/21 0353 08/10/21 0801  GLUCAP 125* 120* 128* 133*   Lipid  Profile: No results for input(s): CHOL, HDL, LDLCALC, TRIG, CHOLHDL, LDLDIRECT in the last 72 hours. Thyroid Function Tests: No results for input(s): TSH, T4TOTAL, FREET4, T3FREE, THYROIDAB in the last 72 hours. Anemia Panel: Recent Labs    08/09/21 1324 08/09/21 1714  RETICCTPCT 9.3* 11.2*   Sepsis Labs: Recent Labs  Lab 08/09/21 1246 08/09/21 1843 08/09/21 2114  PROCALCITON  --  1.59  --   LATICACIDVEN 2.2* 1.2 1.7    Recent Results (from the past 240 hour(s))  Resp Panel by RT-PCR (Flu A&B, Covid) Nasopharyngeal Swab     Status: None   Collection Time: 08/09/21 12:47 PM   Specimen: Nasopharyngeal Swab; Nasopharyngeal(NP) swabs in vial transport medium  Result Value Ref Range Status   SARS Coronavirus 2 by RT PCR NEGATIVE NEGATIVE Final  Comment: (NOTE) SARS-CoV-2 target nucleic acids are NOT DETECTED.  The SARS-CoV-2 RNA is generally detectable in upper respiratory specimens during the acute phase of infection. The lowest concentration of SARS-CoV-2 viral copies this assay can detect is 138 copies/mL. A negative result does not preclude SARS-Cov-2 infection and should not be used as the sole basis for treatment or other patient management decisions. A negative result may occur with  improper specimen collection/handling, submission of specimen other than nasopharyngeal swab, presence of viral mutation(s) within the areas targeted by this assay, and inadequate number of viral copies(<138 copies/mL). A negative result must be combined with clinical observations, patient history, and epidemiological information. The expected result is Negative.  Fact Sheet for Patients:  EntrepreneurPulse.com.au  Fact Sheet for Healthcare Providers:  IncredibleEmployment.be  This test is no t yet approved or cleared by the [place] FDA and  has been authorized for detection and/or diagnosis of SARS-CoV-2 by FDA under an Emergency Use  Authorization (EUA). This EUA will remain  in effect (meaning this test can be used) for the duration of the COVID-19 declaration under Section 564(b)(1) of the Act, 21 U.S.C.section 360bbb-3(b)(1), unless the authorization is terminated  or revoked sooner.       Influenza A by PCR NEGATIVE NEGATIVE Final   Influenza B by PCR NEGATIVE NEGATIVE Final    Comment: (NOTE) The Xpert Xpress SARS-CoV-2/FLU/RSV plus assay is intended as an aid in the diagnosis of influenza from Nasopharyngeal swab specimens and should not be used as a sole basis for treatment. Nasal washings and aspirates are unacceptable for Xpert Xpress SARS-CoV-2/FLU/RSV testing.  Fact Sheet for Patients: EntrepreneurPulse.com.au  Fact Sheet for Healthcare Providers: IncredibleEmployment.be  This test is not yet approved or cleared by the [place] FDA and has been authorized for detection and/or diagnosis of SARS-CoV-2 by FDA under an Emergency Use Authorization (EUA). This EUA will remain in effect (meaning this test can be used) for the duration of the COVID-19 declaration under Section 564(b)(1) of the Act, 21 U.S.C. section 360bbb-3(b)(1), unless the authorization is terminated or revoked.  Performed at [facility], [place] [address], [place], [place] [address]      Radiology Studies: CT ABDOMEN PELVIS W CONTRAST  Result Date: 08/09/2021 CLINICAL DATA:  Abdominal pain, jaundice EXAM: CT ABDOMEN AND PELVIS WITH CONTRAST TECHNIQUE: Multidetector CT imaging of the abdomen and pelvis was performed using the standard protocol following bolus administration of intravenous contrast. CONTRAST:  60m OMNIPAQUE IOHEXOL 350 MG/ML SOLN COMPARISON:  CT abdomen/pelvis 05/28/2018, CT abdomen/pelvis 05/28/2018 FINDINGS: Lower chest: There are bilateral pleural effusions with adjacent atelectasis, left slightly larger than right. The imaged heart is unremarkable.  Hepatobiliary: There are no focal liver lesions. The liver surface contour is mildly nodular. There is cholelithiasis without evidence of acute cholecystitis. There is no intra or extrahepatic biliary ductal dilatation. Pancreas: The pancreas is atrophic. There are no focal lesions or contour abnormalities. There is no main pancreatic ductal dilatation or peripancreatic inflammatory change. Spleen: Unremarkable. Adrenals/Urinary Tract: The adrenals are unremarkable. Slight contour abnormality in the left interpolar region is unchanged going back to 2019. A hypodense lesion in the right lower pole is too small to characterize but most likely reflects a small cyst. There is a 5 mm nonobstructing left renal stone. There is no hydronephrosis or hydroureter. The bladder is decompressed but grossly unremarkable. Stomach/Bowel: The stomach is unremarkable. There is no evidence of bowel obstruction. There is sigmoid diverticulosis without evidence of acute diverticulitis. There is  no evidence of abnormal bowel wall thickening or inflammatory change. Vascular/Lymphatic: There is extensive vascular calcification throughout the nonaneurysmal abdominal aorta. The major branch vessels are patent. The main portal and splenic veins are patent. There is no abdominal or pelvic lymphadenopathy. Reproductive: The uterus is surgically absent. There is a 1.5 cm left adnexal cyst, present since at least 2019. There is no right adnexal mass. Other: There is trace free fluid in the pelvis in the presacral space. There is no upper abdominal ascites. Musculoskeletal: The patient is status post kyphoplasty at T12. There is compression deformity of the T11, L1, L3, L4, and L5 vertebral bodies, overall progressed since 2019, particularly at L1 and L3. There is no evidence of acute fracture or aggressive osseous lesion. Postsurgical changes are partially imaged in the left femur. IMPRESSION: 1. Nodular surface contour of the liver suspicious for  cirrhosis. 2. Cholelithiasis without evidence of acute cholecystitis. No biliary ductal dilatation. 3. 5 mm nonobstructing left renal stone. 4. Bilateral pleural effusions. 5. Trace free fluid in the presacral space, nonspecific. 6. Diverticulosis without evidence of acute diverticulitis. 7. Multiple lumbar compression deformities have overall progressed since 2019, particularly at L1 and L3. Post kyphoplasty changes are noted at T12. Aortic Atherosclerosis (ICD10-I70.0). Electronically Signed   By: Valetta Mole M.D.   On: 08/09/2021 14:35   DG Chest Portable 1 View  Result Date: 08/09/2021 CLINICAL DATA:  Shortness of breath EXAM: PORTABLE CHEST 1 VIEW COMPARISON:  01/10/2021 FINDINGS: Patient is slightly rotated. Mild cardiomegaly. Increased opacity at the left lung base may reflect a combination of pleural effusion and consolidation. Right lung field is clear. No pneumothorax. Bony structures appear demineralized. IMPRESSION: Increased opacity at the left lung base may reflect a combination of pleural effusion and consolidation. Electronically Signed   By: Davina Poke D.O.   On: 08/09/2021 14:09    Scheduled Meds:  Chlorhexidine Gluconate Cloth  6 each Topical Daily   collagenase   Topical Daily   folic acid  2 mg Oral Daily   hydrocerin   Topical Daily   mouth rinse  15 mL Mouth Rinse BID   potassium chloride  40 mEq Oral Once   vitamin B-12  2,000 mcg Oral Daily   Continuous Infusions:  sodium chloride     azithromycin Stopped (08/10/21 0410)   cefTRIAXone (ROCEPHIN)  IV Stopped (08/10/21 0226)   lactated ringers       LOS: 0 days   Marylu Lund, MD [facility] Pager On Amion  If 7PM-7AM, please contact night-coverage 08/10/2021, 12:23 PM

## 2021-08-10 NOTE — Progress Notes (Signed)
Patient lost both IV's due to infiltration. IV team was able to place a new peripheral IV but it is edematous and painful. IV consult put in for a review and new peripheral line. Antibiotics on hold due to no IV access.

## 2021-08-10 NOTE — Progress Notes (Signed)
Sputum sample was attempted to be collected. Patient was not able to follow commands and expectorate a sample.

## 2021-08-10 NOTE — Progress Notes (Signed)
I spoke to pharmacy about running the antibiotics at a slower rate to ensure best used of IV access available. Pharmacy approved. Will run antibiotics at slower rate if acceptable and tolerated by patient.

## 2021-08-10 NOTE — Patient Care Conference (Signed)
Met with patient's sister at bedside, who is pt's emergency contact. Plan of care discussed. All questions were answered. Pt's family reports pt's wishes are DNR/DNI. Will place DNR order. Cont to follow

## 2021-08-10 NOTE — Plan of Care (Signed)
  Problem: Education: Goal: Knowledge of General Education information will improve Description: Including pain rating scale, medication(s)/side effects and non-pharmacologic comfort measures 08/10/2021 2233 by Stevenson Clinch, RN Outcome: Progressing 08/10/2021 2231 by Stevenson Clinch, RN Outcome: Progressing 08/10/2021 2229 by Stevenson Clinch, RN Outcome: Progressing   Problem: Health Behavior/Discharge Planning: Goal: Ability to manage health-related needs will improve 08/10/2021 2233 by Stevenson Clinch, RN Outcome: Progressing 08/10/2021 2231 by Stevenson Clinch, RN Outcome: Progressing 08/10/2021 2229 by Stevenson Clinch, RN Outcome: Progressing   Problem: Clinical Measurements: Goal: Ability to maintain clinical measurements within normal limits will improve 08/10/2021 2233 by Stevenson Clinch, RN Outcome: Progressing 08/10/2021 2231 by Stevenson Clinch, RN Outcome: Progressing 08/10/2021 2229 by Stevenson Clinch, RN Outcome: Progressing Goal: Will remain free from infection 08/10/2021 2233 by Stevenson Clinch, RN Outcome: Progressing 08/10/2021 2231 by Stevenson Clinch, RN Outcome: Progressing 08/10/2021 2229 by Stevenson Clinch, RN Outcome: Progressing Goal: Diagnostic test results will improve 08/10/2021 2233 by Stevenson Clinch, RN Outcome: Progressing 08/10/2021 2231 by Stevenson Clinch, RN Outcome: Progressing 08/10/2021 2229 by Stevenson Clinch, RN Outcome: Progressing Goal: Respiratory complications will improve 08/10/2021 2233 by Stevenson Clinch, RN Outcome: Progressing 08/10/2021 2231 by Stevenson Clinch, RN Outcome: Progressing 08/10/2021 2229 by Stevenson Clinch, RN Outcome: Progressing Goal: Cardiovascular complication will be avoided 08/10/2021 2233 by Stevenson Clinch, RN Outcome: Progressing 08/10/2021 2231 by Stevenson Clinch, RN Outcome: Progressing 08/10/2021 2229 by Stevenson Clinch, RN Outcome: Progressing   Problem: Activity: Goal: Risk for activity intolerance will  decrease 08/10/2021 2233 by Stevenson Clinch, RN Outcome: Progressing 08/10/2021 2231 by Stevenson Clinch, RN Outcome: Progressing 08/10/2021 2229 by Stevenson Clinch, RN Outcome: Progressing   Problem: Nutrition: Goal: Adequate nutrition will be maintained 08/10/2021 2233 by Stevenson Clinch, RN Outcome: Progressing 08/10/2021 2231 by Stevenson Clinch, RN Outcome: Progressing 08/10/2021 2229 by Stevenson Clinch, RN Outcome: Progressing   Problem: Coping: Goal: Level of anxiety will decrease 08/10/2021 2233 by Stevenson Clinch, RN Outcome: Progressing 08/10/2021 2231 by Stevenson Clinch, RN Outcome: Progressing 08/10/2021 2229 by Stevenson Clinch, RN Outcome: Progressing   Problem: Elimination: Goal: Will not experience complications related to bowel motility 08/10/2021 2233 by Stevenson Clinch, RN Outcome: Progressing 08/10/2021 2231 by Stevenson Clinch, RN Outcome: Progressing 08/10/2021 2229 by Stevenson Clinch, RN Outcome: Progressing Goal: Will not experience complications related to urinary retention 08/10/2021 2233 by Stevenson Clinch, RN Outcome: Progressing 08/10/2021 2231 by Stevenson Clinch, RN Outcome: Progressing 08/10/2021 2229 by Stevenson Clinch, RN Outcome: Progressing   Problem: Pain Managment: Goal: General experience of comfort will improve 08/10/2021 2233 by Stevenson Clinch, RN Outcome: Progressing 08/10/2021 2231 by Stevenson Clinch, RN Outcome: Progressing 08/10/2021 2229 by Stevenson Clinch, RN Outcome: Progressing   Problem: Safety: Goal: Ability to remain free from injury will improve 08/10/2021 2233 by Stevenson Clinch, RN Outcome: Progressing 08/10/2021 2231 by Stevenson Clinch, RN Outcome: Progressing Note: Patient reoriented to situation and place.  08/10/2021 2229 by Stevenson Clinch, RN Outcome: Progressing   Problem: Skin Integrity: Goal: Risk for impaired skin integrity will decrease 08/10/2021 2233 by Stevenson Clinch, RN Outcome: Progressing 08/10/2021 2231  by Stevenson Clinch, RN Outcome: Progressing 08/10/2021 2229 by Stevenson Clinch, RN Outcome: Progressing Note: Wound care consult, see order.

## 2021-08-10 NOTE — Progress Notes (Signed)
Patient medications delayed due to lack of access and lack of resources for warming fluids and blood products.   Meda Klinefelter, RN

## 2021-08-10 NOTE — Progress Notes (Addendum)
Second unit of PRBC was initiated per protocol. VSS, no signs of transfusion reaction.   Will continue to monitor.

## 2021-08-10 NOTE — Consult Note (Addendum)
WOC Nurse Consult Note: Reason for Consult: Consult requested for bilat legs and left posterior shoulder Wound type: Patchy areas of dry brown scabs and red dry partial thickness wounds scattered across bilat anterior calves. No significant open wounds. Left posterior shoulder with Unstageable pressure injury; 100% yellow moist slough, 3X3cm Dressing procedure/placement/frequency: Topical treatment orders provided for bedside nurses to perform as follows to promote moist healing: Apply Eucerin cream to bilat legs Q day. Apply Santyl to left posterior shoulder Q day, then cover with moist gauze and foam dressing.  (Change foam dressing Q 3 days or PRN soiling.) Please re-consult if further assistance is needed.  Thank-you,  Julien Girt MSN, [place], [place], [place], [place]

## 2021-08-10 NOTE — Progress Notes (Signed)
At approximately 330 am, patient became increasingly agitated attempting to remove essential equipment. Distraction attempted, reorientation was unsuccessful. Hand mitts were applied for patient safety.   [place] NP aware, see new orders.

## 2021-08-11 DIAGNOSIS — D5912 Cold autoimmune hemolytic anemia: Secondary | ICD-10-CM | POA: Diagnosis not present

## 2021-08-11 DIAGNOSIS — R7401 Elevation of levels of liver transaminase levels: Secondary | ICD-10-CM | POA: Diagnosis not present

## 2021-08-11 DIAGNOSIS — I700 Atherosclerosis of aorta: Secondary | ICD-10-CM

## 2021-08-11 LAB — CBC
HCT: 22.9 % — ABNORMAL LOW (ref 36.0–46.0)
Hemoglobin: 7.6 g/dL — ABNORMAL LOW (ref 12.0–15.0)
MCH: 32.6 pg (ref 26.0–34.0)
MCHC: 33.2 g/dL (ref 30.0–36.0)
MCV: 98.3 fL (ref 80.0–100.0)
Platelets: 465 10*3/uL — ABNORMAL HIGH (ref 150–400)
RBC: 2.33 MIL/uL — ABNORMAL LOW (ref 3.87–5.11)
RDW: 19.7 % — ABNORMAL HIGH (ref 11.5–15.5)
WBC: 17.2 10*3/uL — ABNORMAL HIGH (ref 4.0–10.5)
nRBC: 1.5 % — ABNORMAL HIGH (ref 0.0–0.2)

## 2021-08-11 LAB — COMPREHENSIVE METABOLIC PANEL
ALT: 34 U/L (ref 0–44)
AST: 29 U/L (ref 15–41)
Albumin: 2.8 g/dL — ABNORMAL LOW (ref 3.5–5.0)
Alkaline Phosphatase: 77 U/L (ref 38–126)
Anion gap: 10 (ref 5–15)
BUN: 27 mg/dL — ABNORMAL HIGH (ref 8–23)
CO2: 32 mmol/L (ref 22–32)
Calcium: 7.5 mg/dL — ABNORMAL LOW (ref 8.9–10.3)
Chloride: 101 mmol/L (ref 98–111)
Creatinine, Ser: 0.53 mg/dL (ref 0.44–1.00)
GFR, Estimated: >60 mL/min (ref >60)
Glucose, Bld: 121 mg/dL — ABNORMAL HIGH (ref 70–99)
Potassium: 3.4 mmol/L — ABNORMAL LOW (ref 3.5–5.1)
Sodium: 143 mmol/L (ref 135–145)
Total Bilirubin: 2.4 mg/dL — ABNORMAL HIGH (ref 0.3–1.2)
Total Protein: 5.5 g/dL — ABNORMAL LOW (ref 6.5–8.1)

## 2021-08-11 LAB — GLUCOSE, CAPILLARY: Glucose-Capillary: 96 mg/dL (ref 70–99)

## 2021-08-11 MED ORDER — LIP MEDEX EX OINT
TOPICAL_OINTMENT | CUTANEOUS | Status: DC | PRN
  Administered 2021-08-11: 75 via TOPICAL
  Filled 2021-08-11: qty 7

## 2021-08-11 MED ORDER — FUROSEMIDE 10 MG/ML IJ SOLN
40.0000 mg | Freq: Once | INTRAMUSCULAR | Status: AC
  Administered 2021-08-11: 40 mg via INTRAVENOUS
  Filled 2021-08-11: qty 4

## 2021-08-11 MED ORDER — POTASSIUM CHLORIDE 20 MEQ PO PACK
20.0000 meq | PACK | Freq: Once | ORAL | Status: AC
  Administered 2021-08-11: 20 meq via ORAL
  Filled 2021-08-11: qty 1

## 2021-08-11 NOTE — Progress Notes (Signed)
PROGRESS NOTE    [name]  DHR:[id number] DOB: January 13, 1941 DOA: 08/09/2021 PCP: [name], MD    Brief Narrative:  80 y.o. female with medical history significant of B12 deficiency, bilateral carotid artery gnosis, COPD, diverticulosis/diverticulitis of colon, COPD, former smoker, urolithiasis of the left kidney and ureter, history of idiopathic chronic cold agglutinin disease who was brought to the emergency department via EMS due to decreased mentation, generalized weakness and jaundice.  EMS found the patient to be hypoxic 88% on room air and placed her on supplemental oxygen.  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 Recently given PRBC transfusion with appropriate correction of hemoglobin. Continued on cyanocobalamin and folate. Hematology consulted, recommendations to continue fluids run through warmer, transfuse for hgb <7 Hematology to follow   Active Problems:   Sepsis due to undetermined organism possibly secondary to PNA at time of presentation Sepsis criteria met: Tachypnea, tachycardia, fever. Leukocytosis, lactic acidosis -Presenting CXR reviewed, findings of increased opacity at L lung base may reflect combination of pleural effusion and consolidation -Currently continued on azithromycin and rocephin -Post transfusion, patient noted to have increased O2 requirements.  Repeat chest x-ray reviewed, findings consistent with developing pulmonary edema. -Patient tolerating IV Lasix     Hypokalemia Low this morning, will  replace Magnesium was supplemented. Recheck bmet in AM      COPD (chronic obstructive pulmonary disease) ([place]) Patient is continued on 5 L nasal cannula today. Continue with bronchodilators as needed. Giving IV Lasix as per above No audible wheezing on exam Wean O2 as tolerated     Elevated transaminase level   Hyperbilirubinemia Suspect in the setting of hemolytic anemia. Labs reviewed, liver function profile is improving, bilirubin down to 2.4     Aortic atherosclerosis  Not on medical therapy. Will defer starting statin at the moment. Follow-up with PCP as an outpatient.  Toxic metabolic encephalopathy with underlying dementia -Suspect related to presenting acute illness -Discussed with family member at bedside recently who reports steady and gradual decline prior to this hospital visit, with notable rapid decline leading up to this admission. -Continue with as needed Haldol as needed   DVT prophylaxis: SCD's Code Status: Full Family Communication: Pt in room, family not at bedside  Status is: Inpatient  Continue inpatient because: Severity of illness   Consultants:  PCCM Oncology  Procedures:    Antimicrobials: Anti-infectives (From admission, onward)    Start     Dose/Rate Route Frequency Ordered Stop   08/09/21 2000  cefTRIAXone (ROCEPHIN) 2 g in sodium chloride 0.9 % 100 mL IVPB  Status:  Discontinued        2 g 200 mL/hr over 30 Minutes Intravenous Every 24 hours 08/09/21 1844 08/09/21 1931   08/09/21 2000  azithromycin (ZITHROMAX) 500 mg in sodium chloride 0.9 % 250 mL IVPB        500 mg 250 mL/hr over 60 Minutes Intravenous Every 24 hours 08/09/21 1844 08/13/21 2259   08/09/21 2000  cefTRIAXone (ROCEPHIN) 1 g in sodium chloride 0.9 % 100 mL IVPB  1 g 200 mL/hr over 30 Minutes Intravenous Every 24 hours 08/09/21 1931 08/13/21 2159       Subjective: Difficult to assess given current confusion  Objective: Vitals:   08/11/21 0926 08/11/21  0953 08/11/21 1125 08/11/21 1152  BP:   (!) 150/48   Pulse: (!) 104 92 92   Resp: (!) 39 (!) 22 (!) 23   Temp:    98.1 F (36.7 C)  TempSrc:    Axillary  SpO2: 95% 97% 97%   Weight:      Height:        Intake/Output Summary (Last 24 hours) at 08/11/2021 1213 Last data filed at 08/11/2021 1145 Gross per 24 hour  Intake 590 ml  Output 2100 ml  Net -1510 ml    Filed Weights   08/09/21 1244 08/09/21 2107  Weight: 38 kg 35 kg    Examination: General exam: Conversant, in no acute distress Respiratory system: normal chest rise, clear, no audible wheezing Cardiovascular system: regular rhythm, s1-s2 Gastrointestinal system: Nondistended, nontender, pos BS Central nervous system: No seizures, no tremors Extremities: No cyanosis, no joint deformities Skin: No rashes, no pallor Psychiatry: Difficult to assess given mentation  Data Reviewed: I have personally reviewed following labs and imaging studies  CBC: Recent Labs  Lab 08/09/21 1246 08/09/21 1714 08/10/21 0247 08/10/21 0441 08/11/21 0257  WBC 24.3* 22.9* 21.8*  --  17.2*  NEUTROABS 19.2* 18.3* 17.6*  --   --   HGB 4.5* 4.3* 9.5* 9.3* 7.6*  HCT 13.5* 13.8* 26.7* 27.9* 22.9*  MCV 106.3* 108.7* 101.5*  --  98.3  PLT 655* 528* 506*  --  465*    Basic Metabolic Panel: Recent Labs  Lab 08/09/21 1246 08/09/21 1714 08/10/21 0247 08/11/21 0257  NA 137  --  138 143  K 3.1*  --  3.6 3.4*  CL 97*  --  100 101  CO2 28  --  25 32  GLUCOSE 141*  --  120* 121*  BUN 28*  --  25* 27*  CREATININE 0.80  --  0.65 0.53  CALCIUM 7.9*  --  7.7* 7.5*  MG  --  1.7  --   --     GFR: Estimated Creatinine Clearance: 31.5 mL/min (by C-G formula based on SCr of 0.53 mg/dL). Liver Function Tests: Recent Labs  Lab 08/09/21 1246 08/09/21 1714 08/10/21 0247 08/11/21 0257  AST 113*  --  55* 29  ALT 61*  --  39 34  ALKPHOS 90  --  94 77  BILITOT 5.1* 3.8* 3.3* 2.4*  PROT 6.7  --  5.7* 5.5*  ALBUMIN 3.7  --  3.0* 2.8*     No results for input(s): LIPASE, AMYLASE in the last 168 hours. Recent Labs  Lab 08/09/21 1246  AMMONIA 34    Coagulation Profile: Recent Labs  Lab 08/09/21 1246 08/09/21 1843  INR 1.2 1.3*    Cardiac Enzymes: No results for input(s): CKTOTAL, CKMB, CKMBINDEX, TROPONINI in the last 168 hours. BNP (last 3 results) No results for input(s): PROBNP in the last 8760 hours. HbA1C: No results for input(s): HGBA1C in the last 72 hours. CBG: Recent Labs  Lab 08/09/21 1326 08/09/21 2312 08/10/21 0353 08/10/21 0801 08/10/21 1627  GLUCAP 125* 120* 128* 133* 111*    Lipid Profile: No results for input(s): CHOL, HDL, LDLCALC, TRIG, CHOLHDL, LDLDIRECT in the last 72 hours. Thyroid Function Tests: No results for input(s): TSH, T4TOTAL, FREET4, T3FREE, THYROIDAB in the last 72 hours. Anemia  Panel: Recent Labs    08/09/21 1324 08/09/21 1714  RETICCTPCT 9.3* 11.2*    Sepsis Labs: Recent Labs  Lab 08/09/21 1246 08/09/21 1843 08/09/21 2114  PROCALCITON  --  1.59  --   LATICACIDVEN 2.2* 1.2 1.7     Recent Results (from the past 240 hour(s))  Culture, blood (routine x 2)     Status: None (Preliminary result)   Collection Time: 08/09/21 12:46 PM   Specimen: BLOOD  Result Value Ref Range Status   Specimen Description   Final    BLOOD RIGHT ANTECUBITAL Performed at [facility], [place] [address], [place], [place] [address]    Special Requests   Final    BOTTLES DRAWN AEROBIC AND ANAEROBIC Blood Culture adequate volume Performed at [place] [address], [place], [place] [address]    Culture   Final    NO GROWTH < 24 HOURS Performed at [place] [address], [place], [place] [address]    Report Status PENDING  Incomplete  Resp Panel by RT-PCR (Flu A&B, Covid) Nasopharyngeal Swab     Status: None   Collection Time: 08/09/21 12:47 PM   Specimen: Nasopharyngeal Swab; Nasopharyngeal(NP) swabs in vial transport  medium  Result Value Ref Range Status   SARS Coronavirus 2 by RT PCR NEGATIVE NEGATIVE Final    Comment: (NOTE) SARS-CoV-2 target nucleic acids are NOT DETECTED.  The SARS-CoV-2 RNA is generally detectable in upper respiratory specimens during the acute phase of infection. The lowest concentration of SARS-CoV-2 viral copies this assay can detect is 138 copies/mL. A negative result does not preclude SARS-Cov-2 infection and should not be used as the sole basis for treatment or other patient management decisions. A negative result may occur with  improper specimen collection/handling, submission of specimen other than nasopharyngeal swab, presence of viral mutation(s) within the areas targeted by this assay, and inadequate number of viral copies(<138 copies/mL). A negative result must be combined with clinical observations, patient history, and epidemiological information. The expected result is Negative.  Fact Sheet for Patients:  EntrepreneurPulse.com.au  Fact Sheet for Healthcare Providers:  IncredibleEmployment.be  This test is no t yet approved or cleared by the [place] FDA and  has been authorized for detection and/or diagnosis of SARS-CoV-2 by FDA under an Emergency Use Authorization (EUA). This EUA will remain  in effect (meaning this test can be used) for the duration of the COVID-19 declaration under Section 564(b)(1) of the Act, 21 U.S.C.section 360bbb-3(b)(1), unless the authorization is terminated  or revoked sooner.       Influenza A by PCR NEGATIVE NEGATIVE Final   Influenza B by PCR NEGATIVE NEGATIVE Final    Comment: (NOTE) The Xpert Xpress SARS-CoV-2/FLU/RSV plus assay is intended as an aid in the diagnosis of influenza from Nasopharyngeal swab specimens and should not be used as a sole basis for treatment. Nasal washings and aspirates are unacceptable for Xpert Xpress SARS-CoV-2/FLU/RSV testing.  Fact Sheet for  Patients: EntrepreneurPulse.com.au  Fact Sheet for Healthcare Providers: IncredibleEmployment.be  This test is not yet approved or cleared by the [place] FDA and has been authorized for detection and/or diagnosis of SARS-CoV-2 by FDA under an Emergency Use Authorization (EUA). This EUA will remain in effect (meaning this test can be used) for the duration of the COVID-19 declaration under Section 564(b)(1) of the Act, 21 U.S.C. section 360bbb-3(b)(1), unless the authorization is terminated or revoked.  Performed at [facility], [place]  [address], [place], [place] [address]   Culture, blood (routine x 2)     Status: None (Preliminary result)   Collection Time: 08/09/21 12:51 PM   Specimen: Right Antecubital; Blood  Result Value Ref Range Status   Specimen Description   Final    RIGHT ANTECUBITAL Performed at [place] [address] [place], [place] [address]    Special Requests   Final    BOTTLES DRAWN AEROBIC AND ANAEROBIC Blood Culture results may not be optimal due to an inadequate volume of blood received in culture bottles Performed at [place] [address], [place], [place] [address]    Culture   Final    NO GROWTH < 24 HOURS Performed at [place] [address], [place], [place] [address]    Report Status PENDING  Incomplete      Radiology Studies: CT ABDOMEN PELVIS W CONTRAST  Result Date: 08/09/2021 CLINICAL DATA:  Abdominal pain, jaundice EXAM: CT ABDOMEN AND PELVIS WITH CONTRAST TECHNIQUE: Multidetector CT imaging of the abdomen and pelvis was performed using the standard protocol following bolus administration of intravenous contrast. CONTRAST:  57m OMNIPAQUE IOHEXOL 350 MG/ML SOLN COMPARISON:  CT abdomen/pelvis 05/28/2018, CT abdomen/pelvis 05/28/2018 FINDINGS: Lower chest: There are bilateral pleural effusions with adjacent atelectasis, left  slightly larger than right. The imaged heart is unremarkable. Hepatobiliary: There are no focal liver lesions. The liver surface contour is mildly nodular. There is cholelithiasis without evidence of acute cholecystitis. There is no intra or extrahepatic biliary ductal dilatation. Pancreas: The pancreas is atrophic. There are no focal lesions or contour abnormalities. There is no main pancreatic ductal dilatation or peripancreatic inflammatory change. Spleen: Unremarkable. Adrenals/Urinary Tract: The adrenals are unremarkable. Slight contour abnormality in the left interpolar region is unchanged going back to 2019. A hypodense lesion in the right lower pole is too small to characterize but most likely reflects a small cyst. There is a 5 mm nonobstructing left renal stone. There is no hydronephrosis or hydroureter. The bladder is decompressed but grossly unremarkable. Stomach/Bowel: The stomach is unremarkable. There is no evidence of bowel obstruction. There is sigmoid diverticulosis without evidence of acute diverticulitis. There is no evidence of abnormal bowel wall thickening or inflammatory change. Vascular/Lymphatic: There is extensive vascular calcification throughout the nonaneurysmal abdominal aorta. The major branch vessels are patent. The main portal and splenic veins are patent. There is no abdominal or pelvic lymphadenopathy. Reproductive: The uterus is surgically absent. There is a 1.5 cm left adnexal cyst, present since at least 2019. There is no right adnexal mass. Other: There is trace free fluid in the pelvis in the presacral space. There is no upper abdominal ascites. Musculoskeletal: The patient is status post kyphoplasty at T12. There is compression deformity of the T11, L1, L3, L4, and L5 vertebral bodies, overall progressed since 2019, particularly at L1 and L3. There is no evidence of acute fracture or aggressive osseous lesion. Postsurgical changes are partially imaged in the left femur.  IMPRESSION: 1. Nodular surface contour of the liver suspicious for cirrhosis. 2. Cholelithiasis without evidence of acute cholecystitis. No biliary ductal dilatation. 3. 5 mm nonobstructing left renal stone. 4. Bilateral pleural effusions. 5. Trace free fluid in the presacral space, nonspecific. 6. Diverticulosis without evidence of acute diverticulitis. 7. Multiple lumbar compression deformities have overall progressed since 2019, particularly at L1 and L3. Post kyphoplasty changes are noted at T12. Aortic Atherosclerosis (ICD10-I70.0). Electronically Signed   By: PCourt JoyD.  On: 08/09/2021 14:35   DG CHEST PORT 1 VIEW  Result Date: 08/10/2021 CLINICAL DATA:  Hypoxia. EXAM: PORTABLE CHEST 1 VIEW COMPARISON:  Chest x-ray 08/09/2021. FINDINGS: The heart is enlarged, unchanged. Diffuse central interstitial prominence and patchy perihilar opacities have increased. Small bilateral pleural effusions are present, increasing on the right. There is no evidence for pneumothorax. No acute fractures are seen. IMPRESSION: 1. Increasing moderate pulmonary edema. 2. Small pleural effusions, increasing on the right. 3. Stable cardiomegaly. Electronically Signed   By: Ronney Asters M.D.   On: 08/10/2021 15:13   DG Chest Portable 1 View  Result Date: 08/09/2021 CLINICAL DATA:  Shortness of breath EXAM: PORTABLE CHEST 1 VIEW COMPARISON:  01/10/2021 FINDINGS: Patient is slightly rotated. Mild cardiomegaly. Increased opacity at the left lung base may reflect a combination of pleural effusion and consolidation. Right lung field is clear. No pneumothorax. Bony structures appear demineralized. IMPRESSION: Increased opacity at the left lung base may reflect a combination of pleural effusion and consolidation. Electronically Signed   By: Davina Poke D.O.   On: 08/09/2021 14:09    Scheduled Meds:  Chlorhexidine Gluconate Cloth  6 each Topical Daily   collagenase   Topical Daily   folic acid  2 mg Oral Daily    hydrocerin   Topical Daily   mouth rinse  15 mL Mouth Rinse BID   potassium chloride  20 mEq Oral Once   vitamin B-12  2,000 mcg Oral Daily   Continuous Infusions:  sodium chloride     azithromycin Stopped (08/11/21 0101)   cefTRIAXone (ROCEPHIN)  IV Stopped (08/11/21 0151)   lactated ringers       LOS: 1 day   Marylu Lund, MD [facility] Pager On Amion  If 7PM-7AM, please contact night-coverage 08/11/2021, 12:13 PM

## 2021-08-11 NOTE — Progress Notes (Addendum)
[facility] NP, informed of lab values outside of normal limits.   No new orders.  6:23, a second attempt was made to inform [facility] NP.

## 2021-08-11 NOTE — Progress Notes (Signed)
Patient continues to be agitated and confused. Multiple attempts were made to reorient, distract and provide emotional support without success.   Hand mitts were placed to prevent removal of essential equipment. PRN dose of Haldol administered per order. VSS, will continue to monitor.

## 2021-08-11 NOTE — Evaluation (Signed)
Occupational Therapy Evaluation Patient Details Name: [name] MRN: [id number] DOB: 04/11/1941 Today's Date: 08/11/2021   History of Present Illness 80 y.o. female with medical history significant of B12 deficiency, bilateral carotid artery gnosis, COPD, diverticulosis/diverticulitis of colon, COPD, former smoker, urolithiasis of the left kidney and ureter, history of idiopathic chronic cold agglutinin disease who was brought to the emergency department via EMS due to decreased mentation, generalized weakness and jaundice. 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   Clinical Impression   [name] is a 80 year old woman with above medical history. Evaluation today limited by patient's confusion and sleepiness. Rn reports she did not sleep well last night. Patient is only alert to self and states she is in a dental office and cannot tell me any PLOF information. On evaluation patient exhibits good upper body strength and required min assist for transfers (+2 for safety and lines/leads) for [facility] transfer and steps to the recliner. Patient keeping eyes closed once in seated position. Patient requiring total assist for LB dressing and toileting at this time due to needing +2 assistance and mod assist for UB ADLs due to confusion. Patient will benefit from skilled OT services while in hospital to improve deficits and learn compensatory strategies as needed in order to return to PLOF.  At this time due to unknown home assistance, patient's current physical needs recommend short term rehab at discharge.      Recommendations for follow up therapy are one component of a multi-disciplinary discharge planning process, led by the attending physician.  Recommendations may be updated based on patient status, additional functional criteria and insurance authorization.   Follow Up Recommendations  Skilled nursing-short term rehab (<3 hours/day)    Assistance  Recommended at Discharge Frequent or constant Supervision/Assistance  Functional Status Assessment  Patient has had a recent decline in their functional status and demonstrates the ability to make significant improvements in function in a reasonable and predictable amount of time.  Equipment Recommendations   (TBD)    Recommendations for Other Services       Precautions / Restrictions Precautions Precautions: Fall Restrictions Weight Bearing Restrictions: No      Mobility Bed Mobility               General bed mobility comments: up on [facility] when therapist entered the room    Transfers Overall transfer level: Needs assistance Equipment used: Rolling walker (2 wheels) Transfers: Sit to/from Stand Sit to Stand: Min assist;+2 safety/equipment           General transfer comment: Min assist to rise, stand with walker and take steps to recliner. +2 for safety and lines.      Balance Overall balance assessment: Needs assistance Sitting-balance support: No upper extremity supported Sitting balance-Leahy Scale: Fair     Standing balance support: During functional activity Standing balance-Leahy Scale: Poor Standing balance comment: reliant on walker                           ADL either performed or assessed with clinical judgement   ADL Overall ADL's : Needs assistance/impaired Eating/Feeding: Set up;Sitting   Grooming: Set up;Sitting   Upper Body Bathing: Moderate assistance;Sitting   Lower Body Bathing: Maximal assistance;Sitting/lateral leans   Upper Body Dressing : Moderate assistance;Sitting   Lower Body Dressing: Total assistance;Sit to/from stand;+2 for safety/equipment   Toilet Transfer: +2 for safety/equipment;Minimal assistance;BSC/3in1;Rolling walker (2 wheels);Stand-pivot  Toileting- Clothing Manipulation and Hygiene: Total assistance;Sit to/from stand;+2 for safety/equipment       Functional mobility during ADLs: Minimal  assistance;Rolling walker (2 wheels);+2 for safety/equipment       Vision   Vision Assessment?: No apparent visual deficits     Perception     Praxis      Pertinent Vitals/Pain Pain Assessment: No/denies pain Breathing: normal Negative Vocalization: occasional moan/groan, low speech, negative/disapproving quality Facial Expression: smiling or inexpressive Body Language: relaxed Consolability: no need to console PAINAD Score: 1     Hand Dominance     Extremity/Trunk Assessment Upper Extremity Assessment Upper Extremity Assessment: RUE deficits/detail;LUE deficits/detail RUE Deficits / Details: WFL ROM, 5/5 strength RUE Sensation: WNL RUE Coordination: WNL LUE Deficits / Details: WFL ROM (hx of left humeral fracture) 4/5 shoulder strength otherwise 5/5 strength LUE Sensation: WNL LUE Coordination: WNL   Lower Extremity Assessment Lower Extremity Assessment: Defer to PT evaluation   Cervical / Trunk Assessment Cervical / Trunk Assessment: Kyphotic   Communication     Cognition Arousal/Alertness: Awake/alert Behavior During Therapy: WFL for tasks assessed/performed Overall Cognitive Status: No family/caregiver present to determine baseline cognitive functioning                                 General Comments: Patient is alert to self only. She is able to follow commands.     General Comments       Exercises     Shoulder Instructions      Home Living Family/patient expects to be discharged to:: Unsure                                 Additional Comments: Patient is very confused and no family present.      Prior Functioning/Environment                          OT Problem List: Decreased activity tolerance;Impaired balance (sitting and/or standing);Decreased cognition;Decreased safety awareness;Decreased knowledge of use of DME or AE      OT Treatment/Interventions: Self-care/ADL training;DME and/or AE  instruction;Cognitive remediation/compensation;Therapeutic activities;Balance training;Patient/family education;Therapeutic exercise    OT Goals(Current goals can be found in the care plan section) Acute Rehab OT Goals OT Goal Formulation: Patient unable to participate in goal setting Time For Goal Achievement: 08/25/21 Potential to Achieve Goals: Good  OT Frequency: Min 2X/week   Barriers to D/C:            Co-evaluation              AM-PAC OT "6 Clicks" Daily Activity     Outcome Measure Help from another person eating meals?: A Little Help from another person taking care of personal grooming?: A Little Help from another person toileting, which includes using toliet, bedpan, or urinal?: Total Help from another person bathing (including washing, rinsing, drying)?: A Lot Help from another person to put on and taking off regular upper body clothing?: A Lot Help from another person to put on and taking off regular lower body clothing?: Total 6 Click Score: 12   End of Session Equipment Utilized During Treatment: Rolling walker (2 wheels) Nurse Communication: Mobility status  Activity Tolerance: Patient tolerated treatment well Patient left: in chair;with call bell/phone within reach;with chair alarm set  OT Visit Diagnosis: Unsteadiness on feet (R26.81);Other symptoms and signs  involving cognitive function                Time: 1470-9295 OT Time Calculation (min): 11 min Charges:  OT General Charges $OT Visit: 1 Visit OT Evaluation $OT Eval Low Complexity: 1 Low  Gershon Shorten, OTR/L [place]  Office [phone] Pager: [place] 08/11/2021, 1:34 PM

## 2021-08-12 DIAGNOSIS — R7401 Elevation of levels of liver transaminase levels: Secondary | ICD-10-CM | POA: Diagnosis not present

## 2021-08-12 DIAGNOSIS — D5912 Cold autoimmune hemolytic anemia: Secondary | ICD-10-CM | POA: Diagnosis not present

## 2021-08-12 LAB — COMPREHENSIVE METABOLIC PANEL
ALT: 39 U/L (ref 0–44)
AST: 39 U/L (ref 15–41)
Albumin: 3.3 g/dL — ABNORMAL LOW (ref 3.5–5.0)
Alkaline Phosphatase: 85 U/L (ref 38–126)
Anion gap: 13 (ref 5–15)
BUN: 23 mg/dL (ref 8–23)
CO2: 33 mmol/L — ABNORMAL HIGH (ref 22–32)
Calcium: 7.9 mg/dL — ABNORMAL LOW (ref 8.9–10.3)
Chloride: 95 mmol/L — ABNORMAL LOW (ref 98–111)
Creatinine, Ser: 0.52 mg/dL (ref 0.44–1.00)
GFR, Estimated: >60 mL/min (ref >60)
Glucose, Bld: 98 mg/dL (ref 70–99)
Potassium: 3.3 mmol/L — ABNORMAL LOW (ref 3.5–5.1)
Sodium: 141 mmol/L (ref 135–145)
Total Bilirubin: 1.9 mg/dL — ABNORMAL HIGH (ref 0.3–1.2)
Total Protein: 6.2 g/dL — ABNORMAL LOW (ref 6.5–8.1)

## 2021-08-12 LAB — CBC
HCT: 24.8 % — ABNORMAL LOW (ref 36.0–46.0)
Hemoglobin: 8.3 g/dL — ABNORMAL LOW (ref 12.0–15.0)
MCH: 33.1 pg (ref 26.0–34.0)
MCHC: 33.5 g/dL (ref 30.0–36.0)
MCV: 98.8 fL (ref 80.0–100.0)
Platelets: 413 10*3/uL — ABNORMAL HIGH (ref 150–400)
RBC: 2.51 MIL/uL — ABNORMAL LOW (ref 3.87–5.11)
RDW: 17.7 % — ABNORMAL HIGH (ref 11.5–15.5)
WBC: 13.2 10*3/uL — ABNORMAL HIGH (ref 4.0–10.5)
nRBC: 0.8 % — ABNORMAL HIGH (ref 0.0–0.2)

## 2021-08-12 LAB — LEGIONELLA PNEUMOPHILA SEROGP 1 UR AG: L. pneumophila Serogp 1 Ur Ag: NEGATIVE

## 2021-08-12 MED ORDER — ALPRAZOLAM 0.25 MG PO TABS
0.2500 mg | ORAL_TABLET | Freq: Every day | ORAL | Status: DC
  Administered 2021-08-12 – 2021-08-21 (×10): 0.25 mg via ORAL
  Filled 2021-08-12 (×10): qty 1

## 2021-08-12 MED ORDER — POTASSIUM CHLORIDE 20 MEQ PO PACK
40.0000 meq | PACK | Freq: Once | ORAL | Status: AC
  Administered 2021-08-12: 40 meq via ORAL
  Filled 2021-08-12: qty 2

## 2021-08-12 NOTE — Progress Notes (Signed)
Pt confused and agitated, unable to wear CPAP at this time.

## 2021-08-12 NOTE — Progress Notes (Signed)
PROGRESS NOTE    [name]  OIN:[id number] DOB: Jun 10, 1941 DOA: 08/09/2021 PCP: [name], MD    Brief Narrative:  80 y.o. female with medical history significant of B12 deficiency, bilateral carotid artery gnosis, COPD, diverticulosis/diverticulitis of colon, COPD, former smoker, urolithiasis of the left kidney and ureter, history of idiopathic chronic cold agglutinin disease who was brought to the emergency department via EMS due to decreased mentation, generalized weakness and jaundice.  EMS found the patient to be hypoxic 88% on room air and placed her on supplemental oxygen.  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 Continued on cyanocobalamin and folate. Hematology consulted, recommendations to continue fluids run through warmer, transfuse for hgb <7 Hematology to follow Hgb stable   Active Problems:   Sepsis due to undetermined organism possibly secondary to PNA at time of presentation Sepsis criteria met: Tachypnea, tachycardia, fever. Leukocytosis, lactic acidosis -Presenting CXR reviewed, findings of increased opacity at L lung base may reflect combination of pleural effusion and consolidation -Currently continued on azithromycin and rocephin -Post transfusion, patient noted to have increased O2 requirements.  Repeat chest x-ray reviewed, findings consistent with developing pulmonary edema. -Had tolerated IV lasix -Currently on 2LNC     Hypokalemia Remains  low at 3.3, will replace Repeat bmet in AM      COPD (chronic obstructive pulmonary disease) ([place]) Patient is continued on 5 L nasal cannula today. Continue with bronchodilators as needed. Giving IV Lasix as per above No audible wheezing on exam Wean O2 as tolerated     Elevated transaminase level   Hyperbilirubinemia Suspect in the setting of hemolytic anemia. Labs reviewed, liver function profile is improving, bilirubin down to 2.4     Aortic atherosclerosis  Not on medical therapy. Will defer starting statin at the moment. Recommend follow-up with PCP as an outpatient.  Toxic metabolic encephalopathy with underlying dementia -Suspect related to presenting acute illness -Discussed with family member at bedside recently who reports steady and gradual decline prior to this hospital visit, with notable rapid decline leading up to this admission. -Mentation seems much improved today -Haldol if needed   DVT prophylaxis: SCD's Code Status: Full Family Communication: Pt in room, family not at bedside  Status is: Inpatient  Continue inpatient because: Severity of illness   Consultants:  PCCM Oncology  Procedures:    Antimicrobials: Anti-infectives (From admission, onward)    Start     Dose/Rate Route Frequency Ordered Stop   08/09/21 2000  cefTRIAXone (ROCEPHIN) 2 g in sodium chloride 0.9 % 100 mL IVPB  Status:  Discontinued        2 g 200 mL/hr over 30 Minutes Intravenous Every 24 hours 08/09/21 1844 08/09/21 1931   08/09/21 2000  azithromycin (ZITHROMAX) 500 mg in sodium chloride 0.9 % 250 mL IVPB        500 mg 250 mL/hr over 60 Minutes Intravenous Every 24 hours 08/09/21 1844 08/13/21 2259   08/09/21 2000  cefTRIAXone (ROCEPHIN) 1 g in sodium chloride 0.9 % 100 mL IVPB  1 g 200 mL/hr over 30 Minutes Intravenous Every 24 hours 08/09/21 1931 08/13/21 2159       Subjective: Much more alert and conversant. Without complaints  Objective: Vitals:   08/12/21  0810 08/12/21 1000 08/12/21 1200 08/12/21 1317  BP:    (!) 123/52  Pulse:  94 89 (!) 59  Resp:  (!) 33 (!) 23 (!) 22  Temp: 98.1 F (36.7 C)  97.7 F (36.5 C)   TempSrc: Oral  Oral   SpO2:  97% 100% (!) 75%  Weight:      Height:        Intake/Output Summary (Last 24 hours) at 08/12/2021 1426 Last data filed at 08/12/2021 1230 Gross per 24 hour  Intake 1189.86 ml  Output 300 ml  Net 889.86 ml    Filed Weights   08/09/21 1244 08/09/21 2107  Weight: 38 kg 35 kg    Examination: General exam: Awake, laying in bed, in nad Respiratory system: Normal respiratory effort, no wheezing Cardiovascular system: regular rate, s1, s2 Gastrointestinal system: Soft, nondistended, positive BS Central nervous system: CN2-12 grossly intact, strength intact Extremities: Perfused, no clubbing Skin: Normal skin turgor, no notable skin lesions seen Psychiatry: Mood normal // no visual hallucinations   Data Reviewed: I have personally reviewed following labs and imaging studies  CBC: Recent Labs  Lab 08/09/21 1246 08/09/21 1714 08/10/21 0247 08/10/21 0441 08/11/21 0257 08/12/21 0258  WBC 24.3* 22.9* 21.8*  --  17.2* 13.2*  NEUTROABS 19.2* 18.3* 17.6*  --   --   --   HGB 4.5* 4.3* 9.5* 9.3* 7.6* 8.3*  HCT 13.5* 13.8* 26.7* 27.9* 22.9* 24.8*  MCV 106.3* 108.7* 101.5*  --  98.3 98.8  PLT 655* 528* 506*  --  465* 413*    Basic Metabolic Panel: Recent Labs  Lab 08/09/21 1246 08/09/21 1714 08/10/21 0247 08/11/21 0257 08/12/21 0258  NA 137  --  138 143 141  K 3.1*  --  3.6 3.4* 3.3*  CL 97*  --  100 101 95*  CO2 28  --  25 32 33*  GLUCOSE 141*  --  120* 121* 98  BUN 28*  --  25* 27* 23  CREATININE 0.80  --  0.65 0.53 0.52  CALCIUM 7.9*  --  7.7* 7.5* 7.9*  MG  --  1.7  --   --   --     GFR: Estimated Creatinine Clearance: 31.5 mL/min (by C-G formula based on SCr of 0.52 mg/dL). Liver Function Tests: Recent Labs  Lab 08/09/21 1246 08/09/21 1714 08/10/21 0247  08/11/21 0257 08/12/21 0258  AST 113*  --  55* 29 39  ALT 61*  --  39 34 39  ALKPHOS 90  --  94 77 85  BILITOT 5.1* 3.8* 3.3* 2.4* 1.9*  PROT 6.7  --  5.7* 5.5* 6.2*  ALBUMIN 3.7  --  3.0* 2.8* 3.3*    No results for input(s): LIPASE, AMYLASE in the last 168 hours. Recent Labs  Lab 08/09/21 1246  AMMONIA 34    Coagulation Profile: Recent Labs  Lab 08/09/21 1246 08/09/21 1843  INR 1.2 1.3*    Cardiac Enzymes: No results for input(s): CKTOTAL, CKMB, CKMBINDEX, TROPONINI in the last 168 hours. BNP (last 3 results) No results for input(s): PROBNP in the last 8760 hours. HbA1C: No results for input(s): HGBA1C in the last 72 hours. CBG: Recent Labs  Lab 08/09/21 2312 08/10/21 0353 08/10/21 0801 08/10/21 1627 08/11/21 1943  GLUCAP 120* 128*  133* 111* 96    Lipid Profile: No results for input(s): CHOL, HDL, LDLCALC, TRIG, CHOLHDL, LDLDIRECT in the last 72 hours. Thyroid Function Tests: No results for input(s): TSH, T4TOTAL, FREET4, T3FREE, THYROIDAB in the last 72 hours. Anemia Panel: Recent Labs    08/09/21 1714  RETICCTPCT 11.2*    Sepsis Labs: Recent Labs  Lab 08/09/21 1246 08/09/21 1843 08/09/21 2114  PROCALCITON  --  1.59  --   LATICACIDVEN 2.2* 1.2 1.7     Recent Results (from the past 240 hour(s))  Culture, blood (routine x 2)     Status: None (Preliminary result)   Collection Time: 08/09/21 12:46 PM   Specimen: BLOOD  Result Value Ref Range Status   Specimen Description   Final    BLOOD RIGHT ANTECUBITAL Performed at [facility], [place] [address], [place], [place] [address]    Special Requests   Final    BOTTLES DRAWN AEROBIC AND ANAEROBIC Blood Culture adequate volume Performed at [place] [address] [place], [place] [address]    Culture   Final    NO GROWTH 2 DAYS Performed at [place] [address], [place], [place] [address]    Report Status PENDING  Incomplete  Resp Panel  by RT-PCR (Flu A&B, Covid) Nasopharyngeal Swab     Status: None   Collection Time: 08/09/21 12:47 PM   Specimen: Nasopharyngeal Swab; Nasopharyngeal(NP) swabs in vial transport medium  Result Value Ref Range Status   SARS Coronavirus 2 by RT PCR NEGATIVE NEGATIVE Final    Comment: (NOTE) SARS-CoV-2 target nucleic acids are NOT DETECTED.  The SARS-CoV-2 RNA is generally detectable in upper respiratory specimens during the acute phase of infection. The lowest concentration of SARS-CoV-2 viral copies this assay can detect is 138 copies/mL. A negative result does not preclude SARS-Cov-2 infection and should not be used as the sole basis for treatment or other patient management decisions. A negative result may occur with  improper specimen collection/handling, submission of specimen other than nasopharyngeal swab, presence of viral mutation(s) within the areas targeted by this assay, and inadequate number of viral copies(<138 copies/mL). A negative result must be combined with clinical observations, patient history, and epidemiological information. The expected result is Negative.  Fact Sheet for Patients:  EntrepreneurPulse.com.au  Fact Sheet for Healthcare Providers:  IncredibleEmployment.be  This test is no t yet approved or cleared by the [place] FDA and  has been authorized for detection and/or diagnosis of SARS-CoV-2 by FDA under an Emergency Use Authorization (EUA). This EUA will remain  in effect (meaning this test can be used) for the duration of the COVID-19 declaration under Section 564(b)(1) of the Act, 21 U.S.C.section 360bbb-3(b)(1), unless the authorization is terminated  or revoked sooner.       Influenza A by PCR NEGATIVE NEGATIVE Final   Influenza B by PCR NEGATIVE NEGATIVE Final    Comment: (NOTE) The Xpert Xpress SARS-CoV-2/FLU/RSV plus assay is intended as an aid in the diagnosis of influenza from Nasopharyngeal swab  specimens and should not be used as a sole basis for treatment. Nasal washings and aspirates are unacceptable for Xpert Xpress SARS-CoV-2/FLU/RSV testing.  Fact Sheet for Patients: EntrepreneurPulse.com.au  Fact Sheet for Healthcare Providers: IncredibleEmployment.be  This test is not yet approved or cleared by the [place] FDA and has been authorized for detection and/or diagnosis of SARS-CoV-2 by FDA under an Emergency Use Authorization (EUA). This EUA will remain in effect (meaning this  test can be used) for the duration of the COVID-19 declaration under Section 564(b)(1) of the Act, 21 U.S.C. section 360bbb-3(b)(1), unless the authorization is terminated or revoked.  Performed at [facility], [place] [address], [place], [place] [address]   Culture, blood (routine x 2)     Status: None (Preliminary result)   Collection Time: 08/09/21 12:51 PM   Specimen: Right Antecubital; Blood  Result Value Ref Range Status   Specimen Description   Final    RIGHT ANTECUBITAL Performed at [facility] [address], [place], [place] [address]    Special Requests   Final    BOTTLES DRAWN AEROBIC AND ANAEROBIC Blood Culture results may not be optimal due to an inadequate volume of blood received in culture bottles Performed at [place] [address], [place], [place] [address]    Culture   Final    NO GROWTH 2 DAYS Performed at [place] [address], [place],  [address]    Report Status PENDING  Incomplete      Radiology Studies: No results found.  Scheduled Meds:  ALPRAZolam  0.25 mg Oral Daily   Chlorhexidine Gluconate Cloth  6 each Topical Daily   collagenase   Topical Daily   folic acid  2 mg Oral Daily   hydrocerin   Topical Daily   mouth rinse  15 mL Mouth Rinse BID   vitamin B-12  2,000 mcg Oral Daily   Continuous Infusions:  sodium chloride      azithromycin Stopped (08/12/21 0009)   cefTRIAXone (ROCEPHIN)  IV Stopped (08/11/21 2157)   lactated ringers       LOS: 2 days   Marylu Lund, MD [facility] Pager On Amion  If 7PM-7AM, please contact night-coverage 08/12/2021, 2:26 PM

## 2021-08-12 NOTE — Progress Notes (Signed)
Patient is very confused and agitated, she wants to go home and keeps trying to get out of bed. Haldol given IV, patient continues to be agitated and trying to get out of bed.Alarm on

## 2021-08-12 NOTE — Progress Notes (Signed)
Patient is becoming agitated and wants to go home. She is very confused and is trying to get out of bed. Nurse tech is in the room with her and patient was given Haldol 5 mg IV.

## 2021-08-13 LAB — CBC
HCT: 21.1 % — ABNORMAL LOW (ref 36.0–46.0)
HCT: 24 % — ABNORMAL LOW (ref 36.0–46.0)
Hemoglobin: 7.1 g/dL — ABNORMAL LOW (ref 12.0–15.0)
Hemoglobin: 7.8 g/dL — ABNORMAL LOW (ref 12.0–15.0)
MCH: 33.6 pg (ref 26.0–34.0)
MCH: 31.1 pg (ref 26.0–34.0)
MCHC: 33.6 g/dL (ref 30.0–36.0)
MCHC: 32.5 g/dL (ref 30.0–36.0)
MCV: 100 fL (ref 80.0–100.0)
MCV: 95.6 fL (ref 80.0–100.0)
Platelets: 440 10*3/uL — ABNORMAL HIGH (ref 150–400)
Platelets: 513 10*3/uL — ABNORMAL HIGH (ref 150–400)
RBC: 2.11 MIL/uL — ABNORMAL LOW (ref 3.87–5.11)
RBC: 2.51 MIL/uL — ABNORMAL LOW (ref 3.87–5.11)
RDW: 17.8 % — ABNORMAL HIGH (ref 11.5–15.5)
RDW: 16.7 % — ABNORMAL HIGH (ref 11.5–15.5)
WBC: 11.7 10*3/uL — ABNORMAL HIGH (ref 4.0–10.5)
WBC: 13.1 10*3/uL — ABNORMAL HIGH (ref 4.0–10.5)
nRBC: 0.4 % — ABNORMAL HIGH (ref 0.0–0.2)
nRBC: 0.4 % — ABNORMAL HIGH (ref 0.0–0.2)

## 2021-08-13 LAB — COMPREHENSIVE METABOLIC PANEL
ALT: 35 U/L (ref 0–44)
AST: 31 U/L (ref 15–41)
Albumin: 2.8 g/dL — ABNORMAL LOW (ref 3.5–5.0)
Alkaline Phosphatase: 78 U/L (ref 38–126)
Anion gap: 9 (ref 5–15)
BUN: 13 mg/dL (ref 8–23)
CO2: 30 mmol/L (ref 22–32)
Calcium: 7.8 mg/dL — ABNORMAL LOW (ref 8.9–10.3)
Chloride: 98 mmol/L (ref 98–111)
Creatinine, Ser: 0.48 mg/dL (ref 0.44–1.00)
GFR, Estimated: >60 mL/min (ref >60)
Glucose, Bld: 100 mg/dL — ABNORMAL HIGH (ref 70–99)
Potassium: 4.1 mmol/L (ref 3.5–5.1)
Sodium: 137 mmol/L (ref 135–145)
Total Bilirubin: 1.5 mg/dL — ABNORMAL HIGH (ref 0.3–1.2)
Total Protein: 5.4 g/dL — ABNORMAL LOW (ref 6.5–8.1)

## 2021-08-13 NOTE — Progress Notes (Signed)
Patient unable to wear CPAP at this time due to confusion and agitation. Patient is currently on 3 Lpm [place] and resting comfortably.

## 2021-08-13 NOTE — Progress Notes (Signed)
PT Cancellation Note  Patient Details Name: [name] MRN: [id number] DOB: [birth year]   Cancelled Treatment:    Reason Eval/Treat Not Completed: Other (comment) (sitter reported the pt was up all night and is currently sleeping. She requested we let her sleep. Sitter reported she assisted the pt on the [facility] earlier and she was +1 min assist.)    Lelon Mast 08/13/2021, 12:18 PM

## 2021-08-13 NOTE — Progress Notes (Signed)
PROGRESS NOTE    [name]  HUD:[id number] DOB: [birth year] DOA: 08/09/2021 PCP: [name], MD    Brief Narrative:  80 y.o. female with medical history significant of B12 deficiency, bilateral carotid artery gnosis, COPD, diverticulosis/diverticulitis of colon, COPD, former smoker, urolithiasis of the left kidney and ureter, history of idiopathic chronic cold agglutinin disease who was brought to the emergency department via EMS due to decreased mentation, generalized weakness and jaundice.  EMS found the patient to be hypoxic 88% on room air and placed her on supplemental oxygen.  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 Hgb down to 7.1 this AM, repeat of 7.8 Continued on cyanocobalamin and folate. Hematology consulted 11/17. Per note, Hematology was to f/u. Pending f/u input Hemodynamically stable   Active Problems:   Sepsis due to undetermined organism possibly secondary to PNA at time of presentation Sepsis criteria met: Tachypnea, tachycardia, fever. Leukocytosis, lactic acidosis -Presenting CXR reviewed, findings of increased opacity at L lung base may reflect combination of pleural effusion and consolidation -Completed 5 days of azithromycin and rocephin -Post transfusion, patient noted to have increased O2 requirements.  Repeat chest x-ray reviewed, findings consistent with developing pulmonary edema. -Had tolerated IV lasix -Currently on 2LNC, wean O2 as  tolerated     Hypokalemia Corrected Repeat bmet in AM      COPD (chronic obstructive pulmonary disease) ([place]) Patient is continued on 5 L nasal cannula today. Continue with bronchodilators as needed. No audible wheezing on exam Wean O2 as tolerated     Elevated transaminase level   Hyperbilirubinemia Suspect in the setting of hemolytic anemia. Labs reviewed, liver function profile is improving, bilirubin down to 1.5     Aortic atherosclerosis  Not on medical therapy. Will defer starting statin at the moment. Recommend follow-up with PCP as an outpatient.  Toxic metabolic encephalopathy with underlying dementia -Suspect related to presenting acute illness -Discussed with family member at bedside recently who reports steady and gradual decline prior to this hospital visit, with notable rapid decline leading up to this admission. -Mentation seems further improved today -Haldol if needed   DVT prophylaxis: SCD's Code Status: Full Family Communication: Pt in room, family not at bedside  Status is: Inpatient  Continue inpatient because: Severity of illness   Consultants:  PCCM Oncology  Procedures:    Antimicrobials: Anti-infectives (From admission, onward)    Start     Dose/Rate Route Frequency Ordered Stop   08/09/21 2000  cefTRIAXone (ROCEPHIN) 2 g in sodium chloride 0.9 % 100 mL IVPB  Status:  Discontinued        2 g 200 mL/hr over 30 Minutes Intravenous Every 24 hours 08/09/21 1844 08/09/21 1931   08/09/21 2000  azithromycin (ZITHROMAX) 500 mg in sodium chloride 0.9 % 250 mL IVPB        500 mg 250 mL/hr over 60 Minutes Intravenous Every 24 hours 08/09/21 1844 08/13/21 2259   08/09/21 2000  cefTRIAXone (ROCEPHIN) 1 g in sodium chloride 0.9 % 100 mL IVPB  1 g 200 mL/hr over 30 Minutes Intravenous Every 24 hours 08/09/21 1931 08/13/21 2159       Subjective: Alert, sitting up in bed putting on socks, in nad  Objective: Vitals:   08/12/21 2338 08/13/21  0342 08/13/21 0655 08/13/21 1348  BP: (!) 117/49 (!) 113/46 (!) 130/51 (!) 150/59  Pulse: 98 97 94 93  Resp: '17 16 17 20  ' Temp: 98.1 F (36.7 C) (!) 97.5 F (36.4 C) 97.7 F (36.5 C) 97.7 F (36.5 C)  TempSrc: Oral Oral Oral Oral  SpO2: 90% 94% 94% 93%  Weight:      Height:        Intake/Output Summary (Last 24 hours) at 08/13/2021 1558 Last data filed at 08/13/2021 1321 Gross per 24 hour  Intake 730 ml  Output 150 ml  Net 580 ml    Filed Weights   08/09/21 1244 08/09/21 2107  Weight: 38 kg 35 kg    Examination: General exam: Conversant, in no acute distress Respiratory system: normal chest rise, clear, no audible wheezing Cardiovascular system: regular rhythm, s1-s2 Gastrointestinal system: Nondistended, nontender, pos BS Central nervous system: No seizures, no tremors Extremities: No cyanosis, no joint deformities Skin: No rashes, no pallor Psychiatry: Affect normal // no auditory hallucinations   Data Reviewed: I have personally reviewed following labs and imaging studies  CBC: Recent Labs  Lab 08/09/21 1246 08/09/21 1714 08/10/21 0247 08/10/21 0441 08/11/21 0257 08/12/21 0258 08/13/21 0521 08/13/21 1126  WBC 24.3* 22.9* 21.8*  --  17.2* 13.2* 11.7* 13.1*  NEUTROABS 19.2* 18.3* 17.6*  --   --   --   --   --   HGB 4.5* 4.3* 9.5* 9.3* 7.6* 8.3* 7.1* 7.8*  HCT 13.5* 13.8* 26.7* 27.9* 22.9* 24.8* 21.1* 24.0*  MCV 106.3* 108.7* 101.5*  --  98.3 98.8 100.0 95.6  PLT 655* 528* 506*  --  465* 413* 440* 513*    Basic Metabolic Panel: Recent Labs  Lab 08/09/21 1246 08/09/21 1714 08/10/21 0247 08/11/21 0257 08/12/21 0258 08/13/21 0521  NA 137  --  138 143 141 137  K 3.1*  --  3.6 3.4* 3.3* 4.1  CL 97*  --  100 101 95* 98  CO2 28  --  25 32 33* 30  GLUCOSE 141*  --  120* 121* 98 100*  BUN 28*  --  25* 27* 23 13  CREATININE 0.80  --  0.65 0.53 0.52 0.48  CALCIUM 7.9*  --  7.7* 7.5* 7.9* 7.8*  MG  --  1.7  --   --   --   --     GFR: Estimated  Creatinine Clearance: 31.5 mL/min (by C-G formula based on SCr of 0.48 mg/dL). Liver Function Tests: Recent Labs  Lab 08/09/21 1246 08/09/21 1714 08/10/21 0247 08/11/21 0257 08/12/21 0258 08/13/21 0521  AST 113*  --  55* 29 39 31  ALT 61*  --  39 34 39 35  ALKPHOS 90  --  94 77 85 78  BILITOT 5.1* 3.8* 3.3* 2.4* 1.9* 1.5*  PROT 6.7  --  5.7* 5.5* 6.2* 5.4*  ALBUMIN 3.7  --  3.0* 2.8* 3.3* 2.8*    No results for input(s): LIPASE, AMYLASE in the last 168 hours. Recent Labs  Lab 08/09/21 1246  AMMONIA 34    Coagulation Profile: Recent Labs  Lab 08/09/21 1246 08/09/21 1843  INR 1.2 1.3*    Cardiac Enzymes: No results for input(s): CKTOTAL, CKMB, CKMBINDEX, TROPONINI in the last  168 hours. BNP (last 3 results) No results for input(s): PROBNP in the last 8760 hours. HbA1C: No results for input(s): HGBA1C in the last 72 hours. CBG: Recent Labs  Lab 08/09/21 2312 08/10/21 0353 08/10/21 0801 08/10/21 1627 08/11/21 1943  GLUCAP 120* 128* 133* 111* 96    Lipid Profile: No results for input(s): CHOL, HDL, LDLCALC, TRIG, CHOLHDL, LDLDIRECT in the last 72 hours. Thyroid Function Tests: No results for input(s): TSH, T4TOTAL, FREET4, T3FREE, THYROIDAB in the last 72 hours. Anemia Panel: No results for input(s): VITAMINB12, FOLATE, FERRITIN, TIBC, IRON, RETICCTPCT in the last 72 hours.  Sepsis Labs: Recent Labs  Lab 08/09/21 1246 08/09/21 1843 08/09/21 2114  PROCALCITON  --  1.59  --   LATICACIDVEN 2.2* 1.2 1.7     Recent Results (from the past 240 hour(s))  Culture, blood (routine x 2)     Status: None (Preliminary result)   Collection Time: 08/09/21 12:46 PM   Specimen: BLOOD  Result Value Ref Range Status   Specimen Description   Final    BLOOD RIGHT ANTECUBITAL Performed at [facility], [place] [address], [place], [place] [address]    Special Requests   Final    BOTTLES DRAWN AEROBIC AND ANAEROBIC Blood Culture adequate  volume Performed at [place] [address] [place], [place] [address]    Culture   Final    NO GROWTH 4 DAYS Performed at [facility], [place] [address] [place], [place] [address]    Report Status PENDING  Incomplete  Resp Panel by RT-PCR (Flu A&B, Covid) Nasopharyngeal Swab     Status: None   Collection Time: 08/09/21 12:47 PM   Specimen: Nasopharyngeal Swab; Nasopharyngeal(NP) swabs in vial transport medium  Result Value Ref Range Status   SARS Coronavirus 2 by RT PCR NEGATIVE NEGATIVE Final    Comment: (NOTE) SARS-CoV-2 target nucleic acids are NOT DETECTED.  The SARS-CoV-2 RNA is generally detectable in upper respiratory specimens during the acute phase of infection. The lowest concentration of SARS-CoV-2 viral copies this assay can detect is 138 copies/mL. A negative result does not preclude SARS-Cov-2 infection and should not be used as the sole basis for treatment or other patient management decisions. A negative result may occur with  improper specimen collection/handling, submission of specimen other than nasopharyngeal swab, presence of viral mutation(s) within the areas targeted by this assay, and inadequate number of viral copies(<138 copies/mL). A negative result must be combined with clinical observations, patient history, and epidemiological information. The expected result is Negative.  Fact Sheet for Patients:  EntrepreneurPulse.com.au  Fact Sheet for Healthcare Providers:  IncredibleEmployment.be  This test is no t yet approved or cleared by the [place] FDA and  has been authorized for detection and/or diagnosis of SARS-CoV-2 by FDA under an Emergency Use Authorization (EUA). This EUA will remain  in effect (meaning this test can be used) for the duration of the COVID-19 declaration under Section 564(b)(1) of the Act, 21 U.S.C.section 360bbb-3(b)(1), unless the authorization is terminated   or revoked sooner.       Influenza A by PCR NEGATIVE NEGATIVE Final   Influenza B by PCR NEGATIVE NEGATIVE Final    Comment: (NOTE) The Xpert Xpress SARS-CoV-2/FLU/RSV plus assay is intended as an aid in the diagnosis of influenza from Nasopharyngeal swab specimens and should not be used as a sole basis for treatment. Nasal washings and aspirates are unacceptable for Xpert Xpress SARS-CoV-2/FLU/RSV testing.  Fact Sheet for  Patients: EntrepreneurPulse.com.au  Fact Sheet for Healthcare Providers: IncredibleEmployment.be  This test is not yet approved or cleared by the [place] FDA and has been authorized for detection and/or diagnosis of SARS-CoV-2 by FDA under an Emergency Use Authorization (EUA). This EUA will remain in effect (meaning this test can be used) for the duration of the COVID-19 declaration under Section 564(b)(1) of the Act, 21 U.S.C. section 360bbb-3(b)(1), unless the authorization is terminated or revoked.  Performed at [facility], [place] [address], [place], [place] [address]   Culture, blood (routine x 2)     Status: None (Preliminary result)   Collection Time: 08/09/21 12:51 PM   Specimen: Right Antecubital; Blood  Result Value Ref Range Status   Specimen Description   Final    RIGHT ANTECUBITAL Performed at [place] [address], [place], [place] [address]    Special Requests   Final    BOTTLES DRAWN AEROBIC AND ANAEROBIC Blood Culture results may not be optimal due to an inadequate volume of blood received in culture bottles Performed at [place] [address], [place], [place] [address]    Culture   Final    NO GROWTH 4 DAYS Performed at [facility], [place] [address] [place], [place] [address]    Report Status PENDING  Incomplete      Radiology Studies: No results found.  Scheduled Meds:  ALPRAZolam  0.25 mg Oral Daily    Chlorhexidine Gluconate Cloth  6 each Topical Daily   collagenase   Topical Daily   folic acid  2 mg Oral Daily   hydrocerin   Topical Daily   mouth rinse  15 mL Mouth Rinse BID   vitamin B-12  2,000 mcg Oral Daily   Continuous Infusions:  sodium chloride     azithromycin Stopped (08/13/21 0013)   cefTRIAXone (ROCEPHIN)  IV Stopped (08/13/21 0048)   lactated ringers       LOS: 3 days   Marylu Lund, MD [facility] Pager On Amion  If 7PM-7AM, please contact night-coverage 08/13/2021, 3:58 PM

## 2021-08-14 DIAGNOSIS — D5912 Cold autoimmune hemolytic anemia: Secondary | ICD-10-CM | POA: Diagnosis not present

## 2021-08-14 LAB — CBC
HCT: 19.4 % — ABNORMAL LOW (ref 36.0–46.0)
Hemoglobin: 6.4 g/dL — CL (ref 12.0–15.0)
MCH: 31.5 pg (ref 26.0–34.0)
MCHC: 33 g/dL (ref 30.0–36.0)
MCV: 95.6 fL (ref 80.0–100.0)
Platelets: 471 10*3/uL — ABNORMAL HIGH (ref 150–400)
RBC: 2.03 MIL/uL — ABNORMAL LOW (ref 3.87–5.11)
RDW: 16.9 % — ABNORMAL HIGH (ref 11.5–15.5)
WBC: 11.4 10*3/uL — ABNORMAL HIGH (ref 4.0–10.5)
nRBC: 0.2 % (ref 0.0–0.2)

## 2021-08-14 LAB — HEMOGLOBIN AND HEMATOCRIT, BLOOD
HCT: 28.5 % — ABNORMAL LOW (ref 36.0–46.0)
Hemoglobin: 9.4 g/dL — ABNORMAL LOW (ref 12.0–15.0)

## 2021-08-14 LAB — CULTURE, BLOOD (ROUTINE X 2)
Culture: NO GROWTH
Culture: NO GROWTH
Special Requests: ADEQUATE

## 2021-08-14 LAB — PREPARE RBC (CROSSMATCH)

## 2021-08-14 MED ORDER — SODIUM CHLORIDE 0.9% IV SOLUTION: Freq: Once | INTRAVENOUS | Status: AC

## 2021-08-14 MED ORDER — FUROSEMIDE 10 MG/ML IJ SOLN
40.0000 mg | Freq: Once | INTRAMUSCULAR | Status: AC
  Administered 2021-08-14: 40 mg via INTRAVENOUS
  Filled 2021-08-14: qty 4

## 2021-08-14 NOTE — Progress Notes (Addendum)
Pt is agitated and confused, unable to wear cpap at this time.  RN aware.

## 2021-08-14 NOTE — Progress Notes (Addendum)
HEMATOLOGY-ONCOLOGY PROGRESS NOTE  SUBJECTIVE: This morning, the patient is lying in bed and has no specific complaints.  There is a sitter at the bedside.  Sitter reports that she has been trying to get out of bed intermittently.  She has been confused.  Patient refuses to keep blankets on and stay warm.  The patient reports that she is "burning up."  No bleeding reported.  REVIEW OF SYSTEMS:   A 10 point review of systems is negative except as noted in the HPI.  PHYSICAL EXAMINATION:  Vitals:   08/13/21 2027 08/14/21 0436  BP: (!) 128/54 (!) 127/45  Pulse: 93 81  Resp: 16 14  Temp: 98.2 F (36.8 C) 97.6 F (36.4 C)  SpO2: 91% 95%   Filed Weights   08/09/21 1244 08/09/21 2107  Weight: 38 kg 35 kg    Intake/Output from previous day: 11/21 0701 - 11/22 0700 In: 63 [P.O.:380; IV Piggyback:350] Out: 475 [Urine:475]  GENERAL: Chronically ill-appearing female, no distress SKIN: skin color, texture, turgor are normal, no rashes or significant lesions EYES: Sclera anicteric OROPHARYNX:no exudate, no erythema and lips, buccal mucosa, and tongue normal  LUNGS: clear to auscultation and percussion with normal breathing effort HEART: regular rate & rhythm and no murmurs and no lower extremity edema ABDOMEN:abdomen soft, non-tender and normal bowel sounds NEURO: Alert, but falls asleep quickly.  Mildly confused.  LABORATORY DATA:  I have reviewed the data as listed CMP Latest Ref Rng & Units 08/13/2021 08/12/2021 08/11/2021  Glucose 70 - 99 mg/dL 100(H) 98 121(H)  BUN 8 - 23 mg/dL 13 23 27(H)  Creatinine 0.44 - 1.00 mg/dL 0.48 0.52 0.53  Sodium 135 - 145 mmol/L 137 141 143  Potassium 3.5 - 5.1 mmol/L 4.1 3.3(L) 3.4(L)  Chloride 98 - 111 mmol/L 98 95(L) 101  CO2 22 - 32 mmol/L 30 33(H) 32  Calcium 8.9 - 10.3 mg/dL 7.8(L) 7.9(L) 7.5(L)  Total Protein 6.5 - 8.1 g/dL 5.4(L) 6.2(L) 5.5(L)  Total Bilirubin 0.3 - 1.2 mg/dL 1.5(H) 1.9(H) 2.4(H)  Alkaline Phos 38 - 126 U/L 78 85 77   AST 15 - 41 U/L 31 39 29  ALT 0 - 44 U/L 35 39 34    Lab Results  Component Value Date   WBC 11.4 (H) 08/14/2021   HGB 6.4 (LL) 08/14/2021   HCT 19.4 (L) 08/14/2021   MCV 95.6 08/14/2021   PLT 471 (H) 08/14/2021   NEUTROABS 17.6 (H) 08/10/2021    CT ABDOMEN PELVIS W CONTRAST  Result Date: 08/09/2021 CLINICAL DATA:  Abdominal pain, jaundice EXAM: CT ABDOMEN AND PELVIS WITH CONTRAST TECHNIQUE: Multidetector CT imaging of the abdomen and pelvis was performed using the standard protocol following bolus administration of intravenous contrast. CONTRAST:  76mL OMNIPAQUE IOHEXOL 350 MG/ML SOLN COMPARISON:  CT abdomen/pelvis 05/28/2018, CT abdomen/pelvis 05/28/2018 FINDINGS: Lower chest: There are bilateral pleural effusions with adjacent atelectasis, left slightly larger than right. The imaged heart is unremarkable. Hepatobiliary: There are no focal liver lesions. The liver surface contour is mildly nodular. There is cholelithiasis without evidence of acute cholecystitis. There is no intra or extrahepatic biliary ductal dilatation. Pancreas: The pancreas is atrophic. There are no focal lesions or contour abnormalities. There is no main pancreatic ductal dilatation or peripancreatic inflammatory change. Spleen: Unremarkable. Adrenals/Urinary Tract: The adrenals are unremarkable. Slight contour abnormality in the left interpolar region is unchanged going back to 2019. A hypodense lesion in the right lower pole is too small to characterize but most likely reflects a small  cyst. There is a 5 mm nonobstructing left renal stone. There is no hydronephrosis or hydroureter. The bladder is decompressed but grossly unremarkable. Stomach/Bowel: The stomach is unremarkable. There is no evidence of bowel obstruction. There is sigmoid diverticulosis without evidence of acute diverticulitis. There is no evidence of abnormal bowel wall thickening or inflammatory change. Vascular/Lymphatic: There is extensive vascular  calcification throughout the nonaneurysmal abdominal aorta. The major branch vessels are patent. The main portal and splenic veins are patent. There is no abdominal or pelvic lymphadenopathy. Reproductive: The uterus is surgically absent. There is a 1.5 cm left adnexal cyst, present since at least 2019. There is no right adnexal mass. Other: There is trace free fluid in the pelvis in the presacral space. There is no upper abdominal ascites. Musculoskeletal: The patient is status post kyphoplasty at T12. There is compression deformity of the T11, L1, L3, L4, and L5 vertebral bodies, overall progressed since 2019, particularly at L1 and L3. There is no evidence of acute fracture or aggressive osseous lesion. Postsurgical changes are partially imaged in the left femur. IMPRESSION: 1. Nodular surface contour of the liver suspicious for cirrhosis. 2. Cholelithiasis without evidence of acute cholecystitis. No biliary ductal dilatation. 3. 5 mm nonobstructing left renal stone. 4. Bilateral pleural effusions. 5. Trace free fluid in the presacral space, nonspecific. 6. Diverticulosis without evidence of acute diverticulitis. 7. Multiple lumbar compression deformities have overall progressed since 2019, particularly at L1 and L3. Post kyphoplasty changes are noted at T12. Aortic Atherosclerosis (ICD10-I70.0). Electronically Signed   By: Valetta Mole M.D.   On: 08/09/2021 14:35   DG CHEST PORT 1 VIEW  Result Date: 08/10/2021 CLINICAL DATA:  Hypoxia. EXAM: PORTABLE CHEST 1 VIEW COMPARISON:  Chest x-ray 08/09/2021. FINDINGS: The heart is enlarged, unchanged. Diffuse central interstitial prominence and patchy perihilar opacities have increased. Small bilateral pleural effusions are present, increasing on the right. There is no evidence for pneumothorax. No acute fractures are seen. IMPRESSION: 1. Increasing moderate pulmonary edema. 2. Small pleural effusions, increasing on the right. 3. Stable cardiomegaly. Electronically  Signed   By: Ronney Asters M.D.   On: 08/10/2021 15:13   DG Chest Portable 1 View  Result Date: 08/09/2021 CLINICAL DATA:  Shortness of breath EXAM: PORTABLE CHEST 1 VIEW COMPARISON:  01/10/2021 FINDINGS: Patient is slightly rotated. Mild cardiomegaly. Increased opacity at the left lung base may reflect a combination of pleural effusion and consolidation. Right lung field is clear. No pneumothorax. Bony structures appear demineralized. IMPRESSION: Increased opacity at the left lung base may reflect a combination of pleural effusion and consolidation. Electronically Signed   By: Davina Poke D.O.   On: 08/09/2021 14:09    ASSESSMENT AND PLAN: 1) Acute exacerbation of the patient's chronic cold agglutinin disease related hemolytic anemia 2) Symptomatic Anemia 3) sepsis possibly due to pneumonia  -Continues to have anemia and will be receiving 1 unit PRBCs this morning by a blood warmer. -Unclear if labs are being drawn and prewarmed tubes and being transported at 37 degrees for processing.  If this is not happening, this may lead to an underestimation of her actual hemoglobin level. -Would recommend CBC and all hemolytic labs to be collected in prewarmed tubes at 37 degrees and samples to be run at 37C to get accurate results. -Patient could have had acute exacerbation due to failure to maintain cold avoidance in the context of cooling weather.  Possible viral infection or sepsis could also be additional triggers. B12 or folate deficiency can also  worsen anemia from her chronic hemolysis. -Would recommend continuing B12 1000 mcg daily and folic acid 2 mg p.o. daily. -The patient needs to be kept in a warm room and be dressed and warm cloths from head to toe limiting any open skin. -All IV fluids and blood products need to be given through a blood warmer. -Transfuse to maintain hemoglobin more than 7 -If patient's hemolysis is not controlled with warming and transfusion support and treatment of  an infection then we would need to consider possible Rituxan infusions.  We would recommend this as an outpatient.  Future Appointments  Date Time Provider [place]  08/30/2021  9:00 AM [facility] None  08/30/2021  9:40 AM Brunetta Genera, MD [facility] None      LOS: 4 days   Mikey Bussing, DNP, AGPCNP-BC, AOCNP 08/14/21  ADDENDUM .Patient was Personally and independently interviewed, examined and relevant elements of the history of present illness were reviewed in details and an assessment and plan was created. All elements of the patient's history of present illness , assessment and plan were discussed in details with Mikey Bussing, DNP, AGPCNP-BC, AOCNP. The above documentation reflects our combined findings assessment and plan.   [place] Lone MD MS

## 2021-08-14 NOTE — Progress Notes (Signed)
PROGRESS NOTE    [name]  FKC:[id number] DOB: Aug 31, 1941 DOA: 08/09/2021 PCP: [name], MD    Brief Narrative:  80 y.o. female with medical history significant of B12 deficiency, bilateral carotid artery gnosis, COPD, diverticulosis/diverticulitis of colon, COPD, former smoker, urolithiasis of the left kidney and ureter, history of idiopathic chronic cold agglutinin disease who was brought to the emergency department via EMS due to decreased mentation, generalized weakness and jaundice.  EMS found the patient to be hypoxic 88% on room air and placed her on supplemental oxygen.  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 Hgb has trended down to 6.8 this AM, receiving 1 unit warmed blood tx Continued on cyanocobalamin and folate. Hematology consulted 11/17. Appreciate input today. Recs for CBC and all hemolytic labs to be collected prewarmed tubes at 37 degrees and run at 37 degrees for accurate results All blood and fluids to be given through warmer Cont to transfuse for hgb <7 Consideration for Rituxan infusion which could be done as outpatient   Active Problems:   Sepsis due to undetermined organism possibly secondary to PNA at time of presentation Sepsis criteria met: Tachypnea, tachycardia, fever. Leukocytosis, lactic acidosis -Presenting CXR reviewed, findings of increased opacity at L lung base may reflect combination of pleural effusion and  consolidation -Completed 5 days of azithromycin and rocephin -Had post-transfusion hypoxia, improved with doses of IV lasix -Currently on 3LNC, will give another dose of IV lasix after transfusion today -Continue to wean O2 as tolerated     Hypokalemia Most recent K normal Repeat bmet in AM      COPD (chronic obstructive pulmonary disease) ([place]) Continue with bronchodilators as needed. No audible wheezing on exam Give additional dose of lasix following blood transfusion today Wean O2 as tolerated, goal is room air     Elevated transaminase level   Hyperbilirubinemia Suspect in the setting of hemolytic anemia. Labs reviewed, liver function profile is improving, bilirubin down to 1.5 Repeat cmp in AM     Aortic atherosclerosis  Not on medical therapy. Will defer starting statin at the moment. Recommend follow-up with PCP as an outpatient.  Toxic metabolic encephalopathy with underlying dementia -Suspect related to presenting acute illness -Discussed with family member at bedside recently who reports steady and gradual decline prior to this hospital visit, with notable rapid decline leading up to this admission. -Mentation seems to be near baseline, needing sitter -Haldol if needed   DVT prophylaxis: SCD's Code Status: Full Family Communication: Pt in room, family not at bedside  Status is: Inpatient  Continue inpatient because: Severity of illness   Consultants:  PCCM Oncology  Procedures:    Antimicrobials: Anti-infectives (From admission, onward)    Start     Dose/Rate Route Frequency Ordered Stop   08/09/21 2000  cefTRIAXone (ROCEPHIN) 2 g in sodium chloride 0.9 % 100 mL IVPB  Status:  Discontinued        2 g 200 mL/hr over 30 Minutes Intravenous Every 24 hours 08/09/21 1844 08/09/21 1931  08/09/21 2000  azithromycin (ZITHROMAX) 500 mg in sodium chloride 0.9 % 250 mL IVPB        500 mg 250 mL/hr over 60 Minutes Intravenous Every 24 hours 08/09/21 1844  08/13/21 2259   08/09/21 2000  cefTRIAXone (ROCEPHIN) 1 g in sodium chloride 0.9 % 100 mL IVPB        1 g 200 mL/hr over 30 Minutes Intravenous Every 24 hours 08/09/21 1931 08/13/21 2159       Subjective: Without complaints, mildly confused  Objective: Vitals:   08/13/21 2027 08/14/21 0436 08/14/21 1319 08/14/21 1345  BP: (!) 128/54 (!) 127/45 (!) 131/56 (!) 119/50  Pulse: 93 81 87 77  Resp: _0 Temp: 98.2 F (36.8 C) 97.6 F (36.4 C) 97.8 F (36.6 C) 97.8 F (36.6 C)  TempSrc: Oral Oral Oral Oral  SpO2: 91% 95% 98% 95%  Weight:      Height:        Intake/Output Summary (Last 24 hours) at 08/14/2021 1439 Last data filed at 08/14/2021 1300 Gross per 24 hour  Intake 360 ml  Output 325 ml  Net 35 ml    Filed Weights   08/09/21 1244 08/09/21 2107  Weight: 38 kg 35 kg    Examination: General exam: Awake, laying in bed, in nad Respiratory system: Normal respiratory effort, no wheezing Cardiovascular system: regular rate, s1, s2 Gastrointestinal system: Soft, nondistended, positive BS Central nervous system: CN2-12 grossly intact, strength intact Extremities: Perfused, no clubbing Skin: Normal skin turgor, no notable skin lesions seen Psychiatry: Mood normal // no visual hallucinations   Data Reviewed: I have personally reviewed following labs and imaging studies  CBC: Recent Labs  Lab 08/09/21 1246 08/09/21 1714 08/10/21 0247 08/10/21 0441 08/11/21 0257 08/12/21 0258 08/13/21 0521 08/13/21 1126 08/14/21 0504  WBC 24.3* 22.9* 21.8*  --  17.2* 13.2* 11.7* 13.1* 11.4*  NEUTROABS 19.2* 18.3* 17.6*  --   --   --   --   --   --   HGB 4.5* 4.3* 9.5*   < > 7.6* 8.3* 7.1* 7.8* 6.4*  HCT 13.5* 13.8* 26.7*   < > 22.9* 24.8* 21.1* 24.0* 19.4*  MCV 106.3* 108.7* 101.5*  --  98.3 98.8 100.0 95.6 95.6  PLT 655* 528* 506*  --  465* 413* 440* 513* 471*   < > = values in this interval not displayed.    Basic Metabolic Panel: Recent Labs  Lab 08/09/21 1246  08/09/21 1714 08/10/21 0247 08/11/21 0257 08/12/21 0258 08/13/21 0521  NA 137  --  138 143 141 137  K 3.1*  --  3.6 3.4* 3.3* 4.1  CL 97*  --  100 101 95* 98  CO2 28  --  25 32 33* 30  GLUCOSE 141*  --  120* 121* 98 100*  BUN 28*  --  25* 27* 23 13  CREATININE 0.80  --  0.65 0.53 0.52 0.48  CALCIUM 7.9*  --  7.7* 7.5* 7.9* 7.8*  MG  --  1.7  --   --   --   --     GFR: Estimated Creatinine Clearance: 31.5 mL/min (by C-G formula based on SCr of 0.48 mg/dL). Liver Function Tests: Recent Labs  Lab 08/09/21 1246 08/09/21 1714 08/10/21 0247 08/11/21 0257 08/12/21 0258 08/13/21 0521  AST 113*  --  55* 29 39 31  ALT 61*  --  39 34 39 35  ALKPHOS 90  --  94 77 85 78  BILITOT 5.1* 3.8* 3.3* 2.4* 1.9* 1.5*  PROT 6.7  --  5.7* 5.5* 6.2* 5.4*  ALBUMIN 3.7  --  3.0* 2.8* 3.3* 2.8*    No results for input(s): LIPASE, AMYLASE in the last 168 hours. Recent Labs  Lab 08/09/21 1246  AMMONIA 34    Coagulation Profile: Recent Labs  Lab 08/09/21 1246 08/09/21 1843  INR 1.2 1.3*    Cardiac Enzymes: No results for input(s): CKTOTAL, CKMB, CKMBINDEX, TROPONINI in the last 168 hours. BNP (last 3 results) No results for input(s): PROBNP in the last 8760 hours. HbA1C: No results for input(s): HGBA1C in the last 72 hours. CBG: Recent Labs  Lab 08/09/21 2312 08/10/21 0353 08/10/21 0801 08/10/21 1627 08/11/21 1943  GLUCAP 120* 128* 133* 111* 96    Lipid Profile: No results for input(s): CHOL, HDL, LDLCALC, TRIG, CHOLHDL, LDLDIRECT in the last 72 hours. Thyroid Function Tests: No results for input(s): TSH, T4TOTAL, FREET4, T3FREE, THYROIDAB in the last 72 hours. Anemia Panel: No results for input(s): VITAMINB12, FOLATE, FERRITIN, TIBC, IRON, RETICCTPCT in the last 72 hours.  Sepsis Labs: Recent Labs  Lab 08/09/21 1246 08/09/21 1843 08/09/21 2114  PROCALCITON  --  1.59  --   LATICACIDVEN 2.2* 1.2 1.7     Recent Results (from the past 240 hour(s))  Culture, blood  (routine x 2)     Status: None   Collection Time: 08/09/21 12:46 PM   Specimen: BLOOD  Result Value Ref Range Status   Specimen Description   Final    BLOOD RIGHT ANTECUBITAL Performed at [facility] [address] [place], [place] [address]    Special Requests   Final    BOTTLES DRAWN AEROBIC AND ANAEROBIC Blood Culture adequate volume Performed at [facility] [address], [place], [place] [address]    Culture   Final    NO GROWTH 5 DAYS Performed at [facility], [place] [address], [place], [place] [address]    Report Status 08/14/2021 FINAL  Final  Resp Panel by RT-PCR (Flu A&B, Covid) Nasopharyngeal Swab     Status: None   Collection Time: 08/09/21 12:47 PM   Specimen: Nasopharyngeal Swab; Nasopharyngeal(NP) swabs in vial transport medium  Result Value Ref Range Status   SARS Coronavirus 2 by RT PCR NEGATIVE NEGATIVE Final    Comment: (NOTE) SARS-CoV-2 target nucleic acids are NOT DETECTED.  The SARS-CoV-2 RNA is generally detectable in upper respiratory specimens during the acute phase of infection. The lowest concentration of SARS-CoV-2 viral copies this assay can detect is 138 copies/mL. A negative result does not preclude SARS-Cov-2 infection and should not be used as the sole basis for treatment or other patient management decisions. A negative result may occur with  improper specimen collection/handling, submission of specimen other than nasopharyngeal swab, presence of viral mutation(s) within the areas targeted by this assay, and inadequate number of viral copies(<138 copies/mL). A negative result must be combined with clinical observations, patient history, and epidemiological information. The expected result is Negative.  Fact Sheet for Patients:  EntrepreneurPulse.com.au  Fact Sheet for Healthcare Providers:  IncredibleEmployment.be  This test is no t yet approved or cleared  by the [place] FDA and  has been authorized for detection and/or diagnosis of SARS-CoV-2 by FDA under an Emergency Use Authorization (EUA). This EUA will remain  in effect (meaning this test can be used) for the duration of the COVID-19 declaration under Section 564(b)(1) of the Act, 21 U.S.C.section 360bbb-3(b)(1),  unless the authorization is terminated  or revoked sooner.       Influenza A by PCR NEGATIVE NEGATIVE Final   Influenza B by PCR NEGATIVE NEGATIVE Final    Comment: (NOTE) The Xpert Xpress SARS-CoV-2/FLU/RSV plus assay is intended as an aid in the diagnosis of influenza from Nasopharyngeal swab specimens and should not be used as a sole basis for treatment. Nasal washings and aspirates are unacceptable for Xpert Xpress SARS-CoV-2/FLU/RSV testing.  Fact Sheet for Patients: EntrepreneurPulse.com.au  Fact Sheet for Healthcare Providers: IncredibleEmployment.be  This test is not yet approved or cleared by the [place] FDA and has been authorized for detection and/or diagnosis of SARS-CoV-2 by FDA under an Emergency Use Authorization (EUA). This EUA will remain in effect (meaning this test can be used) for the duration of the COVID-19 declaration under Section 564(b)(1) of the Act, 21 U.S.C. section 360bbb-3(b)(1), unless the authorization is terminated or revoked.  Performed at [facility], [place] [address] [place], [place] [address]   Culture, blood (routine x 2)     Status: None   Collection Time: 08/09/21 12:51 PM   Specimen: Right Antecubital; Blood  Result Value Ref Range Status   Specimen Description   Final    RIGHT ANTECUBITAL Performed at [facility] [address] [place], [place] [address]    Special Requests   Final    BOTTLES DRAWN AEROBIC AND ANAEROBIC Blood Culture results may not be optimal due to an inadequate volume of blood received in culture  bottles Performed at [facility] [address], [place], [place] [address]    Culture   Final    NO GROWTH 5 DAYS Performed at [facility], [place] [address], [place], [place] [address]    Report Status 08/14/2021 FINAL  Final      Radiology Studies: No results found.  Scheduled Meds:  ALPRAZolam  0.25 mg Oral Daily   collagenase   Topical Daily   folic acid  2 mg Oral Daily   hydrocerin   Topical Daily   mouth rinse  15 mL Mouth Rinse BID   vitamin B-12  2,000 mcg Oral Daily   Continuous Infusions:  sodium chloride     lactated ringers       LOS: 4 days   Marylu Lund, MD [facility] Pager On Amion  If 7PM-7AM, please contact night-coverage 08/14/2021, 2:39 PM

## 2021-08-15 LAB — HEMOGLOBIN AND HEMATOCRIT, BLOOD
HCT: 23.1 % — ABNORMAL LOW (ref 36.0–46.0)
Hemoglobin: 7.4 g/dL — ABNORMAL LOW (ref 12.0–15.0)

## 2021-08-15 LAB — CBC
HCT: 21.9 % — ABNORMAL LOW (ref 36.0–46.0)
Hemoglobin: 7.1 g/dL — ABNORMAL LOW (ref 12.0–15.0)
MCH: 30.1 pg (ref 26.0–34.0)
MCHC: 32.4 g/dL (ref 30.0–36.0)
MCV: 92.8 fL (ref 80.0–100.0)
Platelets: 491 10*3/uL — ABNORMAL HIGH (ref 150–400)
RBC: 2.36 MIL/uL — ABNORMAL LOW (ref 3.87–5.11)
RDW: 17.4 % — ABNORMAL HIGH (ref 11.5–15.5)
WBC: 12.9 10*3/uL — ABNORMAL HIGH (ref 4.0–10.5)
nRBC: 0.2 % (ref 0.0–0.2)

## 2021-08-15 LAB — COMPREHENSIVE METABOLIC PANEL
ALT: 21 U/L (ref 0–44)
AST: 16 U/L (ref 15–41)
Albumin: 2.9 g/dL — ABNORMAL LOW (ref 3.5–5.0)
Alkaline Phosphatase: 75 U/L (ref 38–126)
Anion gap: 9 (ref 5–15)
BUN: 15 mg/dL (ref 8–23)
CO2: 31 mmol/L (ref 22–32)
Calcium: 8 mg/dL — ABNORMAL LOW (ref 8.9–10.3)
Chloride: 98 mmol/L (ref 98–111)
Creatinine, Ser: 0.48 mg/dL (ref 0.44–1.00)
GFR, Estimated: >60 mL/min (ref >60)
Glucose, Bld: 103 mg/dL — ABNORMAL HIGH (ref 70–99)
Potassium: 3.7 mmol/L (ref 3.5–5.1)
Sodium: 138 mmol/L (ref 135–145)
Total Bilirubin: 2.1 mg/dL — ABNORMAL HIGH (ref 0.3–1.2)
Total Protein: 5.5 g/dL — ABNORMAL LOW (ref 6.5–8.1)

## 2021-08-15 NOTE — Progress Notes (Signed)
PT Cancellation Note-late entry  Patient Details Name: [name] MRN: [id number] DOB: 01-23-1941   Cancelled Treatment:    Reason Eval/Treat Not Completed: Medical issues which prohibited therapy. Patient receiving blood on 08/14/2021 when PT attempted.    [place] Pager [phone] Office [phone]  Claretha Cooper 08/15/2021, 6:57 AM

## 2021-08-15 NOTE — Progress Notes (Addendum)
PROGRESS NOTE    [name]  SWN:[id number] DOB: Dec 18, 1940 DOA: 08/09/2021 PCP: [name], MD    Brief Narrative:  This 80 yrs old female with PMH  significant of B12 deficiency, bilateral carotid artery stenosis,COPD, diverticulosis/diverticulitis of colon, COPD, former smoker, urolithiasis of the left kidney and ureter, history of idiopathic chronic cold agglutinin disease who was brought to the emergency department via EMS due to decreased mentation, generalized weakness and jaundice.  EMS found the patient to be hypoxic 88% on room air and placed her on supplemental oxygen. Patient admitted for sepsis and acute hypoxic respiratory failure secondary to pneumonia.   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 to pneumonia: Patient presented hypoxic on arrival 88% on room air, requiring 2 L of supplemental oxygen. Continue supplemental oxygen and wean as tolerated. She completed antibiotics for pneumonia.  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 Hgb has trended down to 6.8 on 11/22, has received 1 unit warmed blood tx.  Continue cyanocobalamin and folate. Hematology consulted 11/17.  Appreciated input.   Recommended for CBC and all hemolytic labs to be collected in pre warmed tubes at 37 degrees and run at 37 degrees for accurate results. All blood and fluids to be given through warmer Cont to transfuse for hgb <7 Consideration for Rituxan infusion which could be done as outpatient.   Sepsis could be secondary to PNA: Patient met sepsis criteria on  arrival.  Tachycardia, tachypnea, fever, leukocytosis, lactic acidosis. Chest x-ray showed increased opacity at left lung base may reflect combination of pleural effusion and consolidation Patient has completed 5 days of Zithromax and Rocephin. Patient has developed posttransfusion hypoxia which improved with doses of IV Lasix Sepsis physiology has resolved.  Continue to wean oxygen as tolerated.  Hypokalemia : Replaced and resolved.    COPD: Continue Bronchodilators as needed. Stable. Wean O2 as tolerated, goal is room air.   Elevated liver enzymes: Suspect in the setting of hemolytic anemia. Labs reviewed, liver function profile is improving, bilirubin down to 1.5 Repeat cmp in AM   Aortic atherosclerosis Not on medical therapy. Will defer starting statin at the moment. Recommend follow-up with PCP as an outpatient.   Metabolic encephalopathy with underlying dementia: Suspect could be related to sepsis. Family member reports steady and gradual decline prior to this hospitalization with more notable rapid decline leading to this admission. Mentation seems to be near baseline. Continue Haldol as needed.   DVT prophylaxis: SCDs Code Status: DNR Family Communication: No family at bed side. Disposition Plan:   Status is: Inpatient  Remains inpatient appropriate because: Severity of Illness.  Consultants:  PCCM Oncology  Procedures: None Antimicrobials:   Anti-infectives (From admission, onward)    Start     Dose/Rate Route Frequency Ordered Stop   08/09/21 2000  cefTRIAXone (ROCEPHIN) 2 g in sodium chloride 0.9 % 100 mL IVPB  Status:  Discontinued        2 g 200 mL/hr over 30 Minutes Intravenous Every 24 hours 08/09/21 1844 08/09/21 1931   08/09/21 2000  azithromycin (ZITHROMAX) 500 mg in sodium chloride 0.9 %  250 mL IVPB        500 mg 250 mL/hr over 60 Minutes Intravenous Every 24 hours 08/09/21 1844 08/13/21 2259   08/09/21 2000  cefTRIAXone (ROCEPHIN) 1 g in  sodium chloride 0.9 % 100 mL IVPB        1 g 200 mL/hr over 30 Minutes Intravenous Every 24 hours 08/09/21 1931 08/13/21 2159        Subjective: Patient was seen and examined at bedside.  Overnight events noted.   Patient is sitting comfortably on the recliner.  Denies any pain.  She remains on 2.5 L of oxygen.  Objective: Vitals:   08/14/21 1345 08/14/21 1625 08/14/21 2221 08/15/21 0516  BP: (!) 119/50 (!) 139/41 130/69 (!) 108/41  Pulse: 77 73 89 91  Resp: _0 Temp: 97.8 F (36.6 C) 98.5 F (36.9 C) 98.2 F (36.8 C) 98.1 F (36.7 C)  TempSrc: Oral Oral Oral Oral  SpO2: 95% 98% 96% 94%  Weight:      Height:        Intake/Output Summary (Last 24 hours) at 08/15/2021 1206 Last data filed at 08/15/2021 1135 Gross per 24 hour  Intake 1111.67 ml  Output 700 ml  Net 411.67 ml   Filed Weights   08/09/21 1244 08/09/21 2107  Weight: 38 kg 35 kg    Examination:  General exam: Appears comfortable, not in any acute distress.  Deconditioned Respiratory system: Clear to auscultation bilaterally, respiratory effort normal, RR 15 Cardiovascular system: S1-S2 heard, regular rate and rhythm, no murmur.   Gastrointestinal system: Abdomen is soft, mildly distended, nontender, BS +. Central nervous system: Alert and oriented x 2. No focal neurological deficits. Extremities: No edema, no cyanosis, no clubbing. Skin: No rashes, lesions or ulcers Psychiatry: Judgement and insight appear normal. Mood & affect appropriate.     Data Reviewed: I have personally reviewed following labs and imaging studies  CBC: Recent Labs  Lab 08/09/21 1246 08/09/21 1714 08/10/21 0247 08/10/21 0441 08/12/21 0258 08/13/21 0521 08/13/21 1126 08/14/21 0504 08/14/21 2012 08/15/21 0532  WBC 24.3* 22.9* 21.8*   < > 13.2* 11.7* 13.1* 11.4*  --  12.9*  NEUTROABS 19.2* 18.3* 17.6*  --   --   --   --   --   --   --   HGB 4.5* 4.3* 9.5*   < > 8.3* 7.1* 7.8* 6.4* 9.4* 7.1*  HCT 13.5* 13.8*  26.7*   < > 24.8* 21.1* 24.0* 19.4* 28.5* 21.9*  MCV 106.3* 108.7* 101.5*   < > 98.8 100.0 95.6 95.6  --  92.8  PLT 655* 528* 506*   < > 413* 440* 513* 471*  --  491*   < > = values in this interval not displayed.   Basic Metabolic Panel: Recent Labs  Lab 08/09/21 1714 08/10/21 0247 08/11/21 0257 08/12/21 0258 08/13/21 0521 08/15/21 0532  NA  --  138 143 141 137 138  K  --  3.6 3.4* 3.3* 4.1 3.7  CL  --  100 101 95* 98 98  CO2  --  25 32 33* 30 31  GLUCOSE  --  120* 121* 98 100* 103*  BUN  --  25* 27* _1 CREATININE  --  0.65 0.53 0.52 0.48 0.48  CALCIUM  --  7.7* 7.5* 7.9* 7.8* 8.0*  MG 1.7  --   --   --   --   --    GFR: Estimated Creatinine Clearance: 31.5  mL/min (by C-G formula based on SCr of 0.48 mg/dL). Liver Function Tests: Recent Labs  Lab 08/10/21 0247 08/11/21 0257 08/12/21 0258 08/13/21 0521 08/15/21 0532  AST 55* 29 39 31 16  ALT 39 34 39 35 21  ALKPHOS 94 77 85 78 75  BILITOT 3.3* 2.4* 1.9* 1.5* 2.1*  PROT 5.7* 5.5* 6.2* 5.4* 5.5*  ALBUMIN 3.0* 2.8* 3.3* 2.8* 2.9*   No results for input(s): LIPASE, AMYLASE in the last 168 hours. Recent Labs  Lab 08/09/21 1246  AMMONIA 34   Coagulation Profile: Recent Labs  Lab 08/09/21 1246 08/09/21 1843  INR 1.2 1.3*   Cardiac Enzymes: No results for input(s): CKTOTAL, CKMB, CKMBINDEX, TROPONINI in the last 168 hours. BNP (last 3 results) No results for input(s): PROBNP in the last 8760 hours. HbA1C: No results for input(s): HGBA1C in the last 72 hours. CBG: Recent Labs  Lab 08/09/21 2312 08/10/21 0353 08/10/21 0801 08/10/21 1627 08/11/21 1943  GLUCAP 120* 128* 133* 111* 96   Lipid Profile: No results for input(s): CHOL, HDL, LDLCALC, TRIG, CHOLHDL, LDLDIRECT in the last 72 hours. Thyroid Function Tests: No results for input(s): TSH, T4TOTAL, FREET4, T3FREE, THYROIDAB in the last 72 hours. Anemia Panel: No results for input(s): VITAMINB12, FOLATE, FERRITIN, TIBC, IRON, RETICCTPCT in the  last 72 hours. Sepsis Labs: Recent Labs  Lab 08/09/21 1246 08/09/21 1843 08/09/21 2114  PROCALCITON  --  1.59  --   LATICACIDVEN 2.2* 1.2 1.7    Recent Results (from the past 240 hour(s))  Culture, blood (routine x 2)     Status: None   Collection Time: 08/09/21 12:46 PM   Specimen: BLOOD  Result Value Ref Range Status   Specimen Description   Final    BLOOD RIGHT ANTECUBITAL Performed at [facility] [address], [place], [place] [address]    Special Requests   Final    BOTTLES DRAWN AEROBIC AND ANAEROBIC Blood Culture adequate volume Performed at [facility] [address] [place], [place] [address]    Culture   Final    NO GROWTH 5 DAYS Performed at [facility], [place] [address], [place], [place] [address]    Report Status 08/14/2021 FINAL  Final  Resp Panel by RT-PCR (Flu A&B, Covid) Nasopharyngeal Swab     Status: None   Collection Time: 08/09/21 12:47 PM   Specimen: Nasopharyngeal Swab; Nasopharyngeal(NP) swabs in vial transport medium  Result Value Ref Range Status   SARS Coronavirus 2 by RT PCR NEGATIVE NEGATIVE Final    Comment: (NOTE) SARS-CoV-2 target nucleic acids are NOT DETECTED.  The SARS-CoV-2 RNA is generally detectable in upper respiratory specimens during the acute phase of infection. The lowest concentration of SARS-CoV-2 viral copies this assay can detect is 138 copies/mL. A negative result does not preclude SARS-Cov-2 infection and should not be used as the sole basis for treatment or other patient management decisions. A negative result may occur with  improper specimen collection/handling, submission of specimen other than nasopharyngeal swab, presence of viral mutation(s) within the areas targeted by this assay, and inadequate number of viral copies(<138 copies/mL). A negative result must be combined with clinical observations, patient history, and epidemiological information. The expected  result is Negative.  Fact Sheet for Patients:  EntrepreneurPulse.com.au  Fact Sheet for Healthcare Providers:  IncredibleEmployment.be  This test is no t yet approved or cleared by the [place] FDA and  has been authorized for detection and/or diagnosis of SARS-CoV-2 by  FDA under an Emergency Use Authorization (EUA). This EUA will remain  in effect (meaning this test can be used) for the duration of the COVID-19 declaration under Section 564(b)(1) of the Act, 21 U.S.C.section 360bbb-3(b)(1), unless the authorization is terminated  or revoked sooner.       Influenza A by PCR NEGATIVE NEGATIVE Final   Influenza B by PCR NEGATIVE NEGATIVE Final    Comment: (NOTE) The Xpert Xpress SARS-CoV-2/FLU/RSV plus assay is intended as an aid in the diagnosis of influenza from Nasopharyngeal swab specimens and should not be used as a sole basis for treatment. Nasal washings and aspirates are unacceptable for Xpert Xpress SARS-CoV-2/FLU/RSV testing.  Fact Sheet for Patients: EntrepreneurPulse.com.au  Fact Sheet for Healthcare Providers: IncredibleEmployment.be  This test is not yet approved or cleared by the [place] FDA and has been authorized for detection and/or diagnosis of SARS-CoV-2 by FDA under an Emergency Use Authorization (EUA). This EUA will remain in effect (meaning this test can be used) for the duration of the COVID-19 declaration under Section 564(b)(1) of the Act, 21 U.S.C. section 360bbb-3(b)(1), unless the authorization is terminated or revoked.  Performed at [facility], [place] [address] [place], [place] [address]   Culture, blood (routine x 2)     Status: None   Collection Time: 08/09/21 12:51 PM   Specimen: Right Antecubital; Blood  Result Value Ref Range Status   Specimen Description   Final    RIGHT ANTECUBITAL Performed at [facility]  [address] [place], [place] [address]    Special Requests   Final    BOTTLES DRAWN AEROBIC AND ANAEROBIC Blood Culture results may not be optimal due to an inadequate volume of blood received in culture bottles Performed at [facility] [address] [place], [place] [address]    Culture   Final    NO GROWTH 5 DAYS Performed at [facility], [place] [address] [place], [place] [address]    Report Status 08/14/2021 FINAL  Final    Radiology Studies: No results found.  Scheduled Meds:  ALPRAZolam  0.25 mg Oral Daily   collagenase   Topical Daily   folic acid  2 mg Oral Daily   hydrocerin   Topical Daily   mouth rinse  15 mL Mouth Rinse BID   vitamin B-12  2,000 mcg Oral Daily   Continuous Infusions:  sodium chloride     lactated ringers       LOS: 5 days    Time spent: 35 mins    Jadene Stemmer, MD [facility]   If 7PM-7AM, please contact night-coverage

## 2021-08-15 NOTE — Evaluation (Signed)
Physical Therapy Evaluation Patient Details Name: [name] MRN: [id number] DOB: 07-18-1941 Today's Date: 08/15/2021  History of Present Illness  80 y.o. female with medical history significant of B12 deficiency, bilateral carotid artery gnosis, COPD, diverticulosis/diverticulitis of colon, COPD, former smoker, urolithiasis of the left kidney and ureter, history of idiopathic chronic cold agglutinin disease who was brought to the emergency department via EMS due to decreased mentation, generalized weakness and jaundice. 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  Clinical Impression   Patient peasant and confused.  Patient requires min assistance to sit onto bed edge. Min assist to stand and mod to pivot steps to [facility] then to recliner, + 1 addition for safety and assist with pericare.  Patient will benefit from SNF for rehab unles patient's MS clears and has 24/7 caregivers.       Recommendations for follow up therapy are one component of a multi-disciplinary discharge planning process, led by the attending physician.  Recommendations may be updated based on patient status, additional functional criteria and insurance authorization.  Follow Up Recommendations Skilled nursing-short term rehab (<3 hours/day)    Assistance Recommended at Discharge    Functional Status Assessment Patient has had a recent decline in their functional status and demonstrates the ability to make significant improvements in function in a reasonable and predictable amount of time.  Equipment Recommendations  None recommended by PT    Recommendations for Other Services       Precautions / Restrictions Precautions Precautions: Fall Restrictions Weight Bearing Restrictions: No      Mobility  Bed Mobility Overal bed mobility: Needs Assistance Bed Mobility: Supine to Sit     Supine to sit: Mod assist     General bed mobility comments: assist trunk to sitting     Transfers Overall transfer level: Needs assistance Equipment used: 2 person hand held assist Transfers: Sit to/from Stand;Bed to chair/wheelchair/BSC Sit to Stand: Mod assist   Step pivot transfers: Mod assist       General transfer comment: from bed to bsc then to recliner., requires much support    Ambulation/Gait                  Stairs            Wheelchair Mobility    Modified Rankin (Stroke Patients Only)       Balance Overall balance assessment: Needs assistance Sitting-balance support: Feet supported;No upper extremity supported Sitting balance-Leahy Scale: Fair     Standing balance support: During functional activity;Bilateral upper extremity supported Standing balance-Leahy Scale: Poor Standing balance comment: reliant on support                             Pertinent Vitals/Pain Pain Assessment: No/denies pain Faces Pain Scale: Hurts a little bit Pain Location: unspecified Pain Descriptors / Indicators: Discomfort Pain Intervention(s): Monitored during session    Home Living Family/patient expects to be discharged to:: Unsure                   Additional Comments: Patient is very confused and no family present.    Prior Function                       Hand Dominance   Dominant Hand: Right    Extremity/Trunk Assessment   Upper Extremity Assessment Upper Extremity Assessment: Defer to OT evaluation    Lower Extremity Assessment  Lower Extremity Assessment: Generalized weakness    Cervical / Trunk Assessment Cervical / Trunk Assessment: Kyphotic  Communication   Communication: No difficulties  Cognition Arousal/Alertness: Awake/alert Behavior During Therapy: WFL for tasks assessed/performed Overall Cognitive Status: No family/caregiver present to determine baseline cognitive functioning                                 General Comments: Patient is alert to self only. She is able to  follow one step commands.        General Comments      Exercises     Assessment/Plan    PT Assessment Patient needs continued PT services  PT Problem List Decreased strength;Decreased cognition;Decreased mobility;Decreased activity tolerance;Decreased knowledge of use of DME;Decreased safety awareness       PT Treatment Interventions DME instruction;Therapeutic activities;Cognitive remediation;Gait training;Patient/family education;Therapeutic exercise;Functional mobility training;Balance training    PT Goals (Current goals can be found in the Care Plan section)  Acute Rehab PT Goals PT Goal Formulation: Patient unable to participate in goal setting Time For Goal Achievement: 08/29/21 Potential to Achieve Goals: Fair    Frequency Min 2X/week   Barriers to discharge        Co-evaluation               AM-PAC PT "6 Clicks" Mobility  Outcome Measure Help needed turning from your back to your side while in a flat bed without using bedrails?: A Lot Help needed moving from lying on your back to sitting on the side of a flat bed without using bedrails?: A Lot Help needed moving to and from a bed to a chair (including a wheelchair)?: A Lot Help needed standing up from a chair using your arms (e.g., wheelchair or bedside chair)?: A Lot Help needed to walk in hospital room?: Total Help needed climbing 3-5 steps with a railing? : Total 6 Click Score: 10    End of Session   Activity Tolerance: Patient tolerated treatment well Patient left: in chair;with call bell/phone within reach;with nursing/sitter in room Nurse Communication: Mobility status PT Visit Diagnosis: Unsteadiness on feet (R26.81)    Time: 0093-8182 PT Time Calculation (min) (ACUTE ONLY): 16 min   Charges:   PT Evaluation $PT Eval Low Complexity: [place] Pager [phone] Office [phone]   Claretha Cooper 08/15/2021, 1:20 PM

## 2021-08-15 NOTE — Progress Notes (Signed)
Occupational Therapy Treatment Patient Details Name: [name] MRN: [id number] DOB: November 15, 1940 Today's Date: 08/15/2021   History of present illness 80 y.o. female with medical history significant of B12 deficiency, bilateral carotid artery gnosis, COPD, diverticulosis/diverticulitis of colon, COPD, former smoker, urolithiasis of the left kidney and ureter, history of idiopathic chronic cold agglutinin disease who was brought to the emergency department via EMS due to decreased mentation, generalized weakness and jaundice. 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   OT comments  Pt progressing towards acute OT goals. Remains to need up to +2 assist with LB ADLs and mobility. Full session details below. D/c plan remains appropriate.    Recommendations for follow up therapy are one component of a multi-disciplinary discharge planning process, led by the attending physician.  Recommendations may be updated based on patient status, additional functional criteria and insurance authorization.    Follow Up Recommendations  Skilled nursing-short term rehab (<3 hours/day)    Assistance Recommended at Discharge Frequent or constant Supervision/Assistance  Equipment Recommendations  Other (comment) (defer to next venue)    Recommendations for Other Services      Precautions / Restrictions Precautions Precautions: Fall Restrictions Weight Bearing Restrictions: No       Mobility Bed Mobility                    Transfers Overall transfer level: Needs assistance Equipment used: 2 person hand held assist Transfers: Sit to/from Stand;Bed to chair/wheelchair/BSC Sit to Stand: Min assist;+2 safety/equipment   Step pivot transfers: Min assist;+2 physical assistance;+2 safety/equipment       General transfer comment: to/from [facility], BSC>recliner. steadying assist. sequencing cues.     Balance Overall balance assessment: Needs  assistance Sitting-balance support: No upper extremity supported Sitting balance-Leahy Scale: Fair     Standing balance support: During functional activity Standing balance-Leahy Scale: Poor Standing balance comment: reliant on walker                           ADL either performed or assessed with clinical judgement   ADL Overall ADL's : Needs assistance/impaired         Upper Body Bathing: Minimal assistance;Sitting   Lower Body Bathing: Maximal assistance;Sit to/from stand   Upper Body Dressing : Moderate assistance;Sitting       Toilet Transfer: Minimal assistance;+2 for safety/equipment;Stand-pivot;BSC/3in1 (+2 HHA)   Toileting- Clothing Manipulation and Hygiene: Total assistance;Sit to/from stand;+2 for safety/equipment         General ADL Comments: Pt finishing up bathing and toileting tasks with NT upon arrival of OT. Up in recliner at end of session.    Extremity/Trunk Assessment Upper Extremity Assessment Upper Extremity Assessment: Generalized weakness   Lower Extremity Assessment Lower Extremity Assessment: Defer to PT evaluation        Vision       Perception     Praxis      Cognition Arousal/Alertness: Awake/alert Behavior During Therapy: WFL for tasks assessed/performed Overall Cognitive Status: No family/caregiver present to determine baseline cognitive functioning                                 General Comments: Patient is alert to self only. She is able to follow one step commands.          Exercises     Shoulder Instructions  General Comments      Pertinent Vitals/ Pain       Pain Assessment: Faces Faces Pain Scale: Hurts a little bit Pain Location: unspecified Pain Descriptors / Indicators: Discomfort Pain Intervention(s): Monitored during session  Home Living                                          Prior Functioning/Environment              Frequency  Min  2X/week        Progress Toward Goals  OT Goals(current goals can now be found in the care plan section)  Progress towards OT goals: Progressing toward goals  Acute Rehab OT Goals OT Goal Formulation: Patient unable to participate in goal setting Time For Goal Achievement: 08/25/21 Potential to Achieve Goals: Good ADL Goals Pt Will Transfer to Toilet: with min guard assist;regular height toilet;ambulating;grab bars Pt Will Perform Toileting - Clothing Manipulation and hygiene: with min guard assist;sit to/from stand Additional ADL Goal #1: Patient will stand at sink to perform grooming task as evidence of improving activity tolerance Additional ADL Goal #2: Patient will perform 10 min functional activity or exercise activity as evidence of improving activity tolerance  Plan Discharge plan remains appropriate    Co-evaluation                 AM-PAC OT "6 Clicks" Daily Activity     Outcome Measure   Help from another person eating meals?: A Little Help from another person taking care of personal grooming?: A Little Help from another person toileting, which includes using toliet, bedpan, or urinal?: Total Help from another person bathing (including washing, rinsing, drying)?: A Lot Help from another person to put on and taking off regular upper body clothing?: A Lot Help from another person to put on and taking off regular lower body clothing?: Total 6 Click Score: 12    End of Session Equipment Utilized During Treatment: Oxygen;Other (comment) (+2 HHA)  OT Visit Diagnosis: Unsteadiness on feet (R26.81);Other symptoms and signs involving cognitive function   Activity Tolerance Patient tolerated treatment well   Patient Left in chair;with call bell/phone within reach;with chair alarm set;with nursing/sitter in room   Nurse Communication          Time: 4765-4650 OT Time Calculation (min): 15 min  Charges: OT General Charges $OT Visit: 1 Visit OT  Treatments $Self Care/Home Management : 8-22 mins  Tyrone Schimke, OT Acute Rehabilitation Services Pager: [phone] Office: [phone]   [name] 08/15/2021, 12:50 PM

## 2021-08-15 NOTE — Progress Notes (Signed)
Patient remains pleasant and confused today. CPAP remains on hold until MS clears. RN aware.

## 2021-08-16 LAB — CBC
HCT: 20.8 % — ABNORMAL LOW (ref 36.0–46.0)
Hemoglobin: 6.5 g/dL — CL (ref 12.0–15.0)
MCH: 29.7 pg (ref 26.0–34.0)
MCHC: 31.3 g/dL (ref 30.0–36.0)
MCV: 95 fL (ref 80.0–100.0)
Platelets: 567 10*3/uL — ABNORMAL HIGH (ref 150–400)
RBC: 2.19 MIL/uL — ABNORMAL LOW (ref 3.87–5.11)
RDW: 17.2 % — ABNORMAL HIGH (ref 11.5–15.5)
WBC: 11.8 10*3/uL — ABNORMAL HIGH (ref 4.0–10.5)
nRBC: 0.3 % — ABNORMAL HIGH (ref 0.0–0.2)

## 2021-08-16 LAB — COMPREHENSIVE METABOLIC PANEL
ALT: 20 U/L (ref 0–44)
AST: 17 U/L (ref 15–41)
Albumin: 3 g/dL — ABNORMAL LOW (ref 3.5–5.0)
Alkaline Phosphatase: 87 U/L (ref 38–126)
Anion gap: 8 (ref 5–15)
BUN: 13 mg/dL (ref 8–23)
CO2: 32 mmol/L (ref 22–32)
Calcium: 8.1 mg/dL — ABNORMAL LOW (ref 8.9–10.3)
Chloride: 98 mmol/L (ref 98–111)
Creatinine, Ser: 0.36 mg/dL — ABNORMAL LOW (ref 0.44–1.00)
GFR, Estimated: >60 mL/min (ref >60)
Glucose, Bld: 85 mg/dL (ref 70–99)
Potassium: 4 mmol/L (ref 3.5–5.1)
Sodium: 138 mmol/L (ref 135–145)
Total Bilirubin: 2.7 mg/dL — ABNORMAL HIGH (ref 0.3–1.2)
Total Protein: 5.6 g/dL — ABNORMAL LOW (ref 6.5–8.1)

## 2021-08-16 LAB — TYPE AND SCREEN
ABO/RH(D): B POS
Antibody Screen: NEGATIVE
Unit division: 0

## 2021-08-16 LAB — BPAM RBC
Blood Product Expiration Date: 202212212359
ISSUE DATE / TIME: 202211221326
Unit Type and Rh: 5100

## 2021-08-16 LAB — MAGNESIUM: Magnesium: 1.8 mg/dL (ref 1.7–2.4)

## 2021-08-16 LAB — PREPARE RBC (CROSSMATCH)

## 2021-08-16 LAB — PHOSPHORUS: Phosphorus: 3 mg/dL (ref 2.5–4.6)

## 2021-08-16 LAB — HEMOGLOBIN AND HEMATOCRIT, BLOOD
HCT: 25.7 % — ABNORMAL LOW (ref 36.0–46.0)
Hemoglobin: 8.9 g/dL — ABNORMAL LOW (ref 12.0–15.0)

## 2021-08-16 MED ORDER — SODIUM CHLORIDE 0.9% IV SOLUTION: Freq: Once | INTRAVENOUS | Status: AC

## 2021-08-16 NOTE — Progress Notes (Signed)
PROGRESS NOTE    [name]  GYI:[id number] DOB: [birth year] DOA: 08/09/2021 PCP: [name], MD    Brief Narrative:  This 80 yrs old female with PMH  significant of B12 deficiency, bilateral carotid artery stenosis,COPD, diverticulosis/diverticulitis of colon, COPD, former smoker, urolithiasis of the left kidney and ureter, history of idiopathic chronic cold agglutinin disease who was brought to the emergency department via EMS due to decreased mentation, generalized weakness and jaundice.  EMS found the patient to be hypoxic 88% on room air and placed her on supplemental oxygen. Patient admitted for sepsis and acute hypoxic respiratory failure secondary to pneumonia.  She has completed antibiotics for pneumonia.  She remains hypoxic , requiring  2 L of supplemental oxygen.  She is receiving multiple units of blood transfusion due to hemolytic anemia because of Cold antibody.  Heme-onc is consulted.   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 to pneumonia: Patient presented hypoxic on arrival, 88% on room air, requiring 2 L of supplemental oxygen. Continue supplemental oxygen and wean as tolerated. She completed antibiotics for pneumonia.  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 Hgb has trended down to 6.8 on 11/22, has received 1 unit warmed blood tx.  Continue cyanocobalamin and folate. Hematology consulted 11/17.  Appreciated input.   Recommended for CBC and all hemolytic labs to be collected in pre warmed tubes at 37 degrees and run at 37 degrees for  accurate results. All blood and fluids to be given through warmer Cont to transfuse for hgb <7 Consideration for Rituxan infusion which could be done as outpatient. Her hemoglobin again dropped to 6.8.  Getting 1 unit of PRBC. Reached out to heme-onc to see if Rituxan infusion can be given inpatient.   Sepsis could be secondary to PNA: Patient met sepsis criteria on arrival.  Tachycardia, tachypnea, fever, leukocytosis, lactic acidosis. Chest x-ray showed increased opacity at left lung base may reflect combination of pleural effusion and consolidation Patient has completed 5 days of Zithromax and Rocephin. Patient has developed posttransfusion hypoxia which improved with doses of IV Lasix Sepsis physiology has resolved.  Continue to wean oxygen as tolerated.  Hypokalemia : Replaced and resolved.    COPD: Continue Bronchodilators as needed. Stable. Wean O2 as tolerated, goal is room air.   Elevated liver enzymes: Suspect in the setting of hemolytic anemia. Labs reviewed, liver function profile is improving, bilirubin up to 2.7 Repeat cmp in AM   Aortic atherosclerosis Not on medical therapy. Will defer starting statin at the moment. Recommend follow-up with PCP as an outpatient.   Metabolic encephalopathy with underlying dementia: Suspect could be related to sepsis. Family member reports steady and gradual decline prior to this hospitalization with more notable rapid decline leading to this admission. Mentation seems to be near baseline. Continue Haldol as needed.   DVT prophylaxis: SCDs Code Status: DNR Family Communication: No family at bed side. Disposition Plan:   Status is: Inpatient  Remains inpatient appropriate because: Severity of Illness.  Hemolytic anemia requiring blood transfusions.  Consultants:  PCCM Oncology  Procedures: None Antimicrobials:   Anti-infectives (From admission,  onward)    Start     Dose/Rate Route Frequency Ordered Stop   08/09/21  2000  cefTRIAXone (ROCEPHIN) 2 g in sodium chloride 0.9 % 100 mL IVPB  Status:  Discontinued        2 g 200 mL/hr over 30 Minutes Intravenous Every 24 hours 08/09/21 1844 08/09/21 1931   08/09/21 2000  azithromycin (ZITHROMAX) 500 mg in sodium chloride 0.9 % 250 mL IVPB        500 mg 250 mL/hr over 60 Minutes Intravenous Every 24 hours 08/09/21 1844 08/13/21 2259   08/09/21 2000  cefTRIAXone (ROCEPHIN) 1 g in sodium chloride 0.9 % 100 mL IVPB        1 g 200 mL/hr over 30 Minutes Intravenous Every 24 hours 08/09/21 1931 08/13/21 2159        Subjective: Patient was seen and examined at bedside.  Overnight events noted.   Patient is lying comfortably on the bed. She denies any pain. She remains on 2.5 L of oxygen. Her hemoglobin has dropped to 6.5 now requiring 1 unit of PRBC.  Objective: Vitals:   08/15/21 1345 08/15/21 2131 08/15/21 2132 08/16/21 0603  BP: (!) 107/40 (!) 118/44 (!) 118/44 (!) 106/45  Pulse: 68 72 71 61  Resp: '20 18 18 18  ' Temp: (!) 97.5 F (36.4 C) (!) 97.3 F (36.3 C) (!) 97.3 F (36.3 C) 97.6 F (36.4 C)  TempSrc: Oral Oral Oral Oral  SpO2: 98% 96% 99% 95%  Weight:      Height:        Intake/Output Summary (Last 24 hours) at 08/16/2021 1054 Last data filed at 08/15/2021 1823 Gross per 24 hour  Intake 390 ml  Output 300 ml  Net 90 ml   Filed Weights   08/09/21 1244 08/09/21 2107  Weight: 38 kg 35 kg    Examination:  General exam: Appears chronically ill looking, deconditioned, comfortable, not in any distress Respiratory system: Clear to auscultation bilaterally, respiratory effort normal, RR 14 Cardiovascular system: S1-S2 heard, regular rate and rhythm, no murmur.   Gastrointestinal system: Abdomen is soft, mildly distended, nontender, BS +. Central nervous system: Alert and oriented x 2. No focal neurological deficits. Extremities: No edema, no cyanosis, no clubbing. Skin: No rashes, lesions or ulcers Psychiatry: Judgement and insight appear  normal. Mood & affect appropriate.     Data Reviewed: I have personally reviewed following labs and imaging studies  CBC: Recent Labs  Lab 08/09/21 1246 08/09/21 1714 08/10/21 0247 08/10/21 0441 08/13/21 0521 08/13/21 1126 08/14/21 0504 08/14/21 2012 08/15/21 0532 08/15/21 1208 08/16/21 0606  WBC 24.3* 22.9* 21.8*   < > 11.7* 13.1* 11.4*  --  12.9*  --  11.8*  NEUTROABS 19.2* 18.3* 17.6*  --   --   --   --   --   --   --   --   HGB 4.5* 4.3* 9.5*   < > 7.1* 7.8* 6.4* 9.4* 7.1* 7.4* 6.5*  HCT 13.5* 13.8* 26.7*   < > 21.1* 24.0* 19.4* 28.5* 21.9* 23.1* 20.8*  MCV 106.3* 108.7* 101.5*   < > 100.0 95.6 95.6  --  92.8  --  95.0  PLT 655* 528* 506*   < > 440* 513* 471*  --  491*  --  567*   < > = values in this interval not displayed.   Basic Metabolic Panel: Recent Labs  Lab 08/09/21 1714 08/10/21 0247 08/11/21 0257 08/12/21 0258 08/13/21 0737 08/15/21 0532 08/16/21 1062  NA  --    < > 143 141 137 138 138  K  --    < > 3.4* 3.3* 4.1 3.7 4.0  CL  --    < > 101 95* 98 98 98  CO2  --    < > 32 33* 30 31 32  GLUCOSE  --    < > 121* 98 100* 103* 85  BUN  --    < > 27* '23 13 15 13  ' CREATININE  --    < > 0.53 0.52 0.48 0.48 0.36*  CALCIUM  --    < > 7.5* 7.9* 7.8* 8.0* 8.1*  MG 1.7  --   --   --   --   --  1.8  PHOS  --   --   --   --   --   --  3.0   < > = values in this interval not displayed.   GFR: Estimated Creatinine Clearance: 31.5 mL/min (A) (by C-G formula based on SCr of 0.36 mg/dL (L)). Liver Function Tests: Recent Labs  Lab 08/11/21 0257 08/12/21 0258 08/13/21 0521 08/15/21 0532 08/16/21 0606  AST 29 39 '31 16 17  ' ALT 34 39 35 21 20  ALKPHOS 77 85 78 75 87  BILITOT 2.4* 1.9* 1.5* 2.1* 2.7*  PROT 5.5* 6.2* 5.4* 5.5* 5.6*  ALBUMIN 2.8* 3.3* 2.8* 2.9* 3.0*   No results for input(s): LIPASE, AMYLASE in the last 168 hours. Recent Labs  Lab 08/09/21 1246  AMMONIA 34   Coagulation Profile: Recent Labs  Lab 08/09/21 1246 08/09/21 1843  INR 1.2 1.3*    Cardiac Enzymes: No results for input(s): CKTOTAL, CKMB, CKMBINDEX, TROPONINI in the last 168 hours. BNP (last 3 results) No results for input(s): PROBNP in the last 8760 hours. HbA1C: No results for input(s): HGBA1C in the last 72 hours. CBG: Recent Labs  Lab 08/09/21 2312 08/10/21 0353 08/10/21 0801 08/10/21 1627 08/11/21 1943  GLUCAP 120* 128* 133* 111* 96   Lipid Profile: No results for input(s): CHOL, HDL, LDLCALC, TRIG, CHOLHDL, LDLDIRECT in the last 72 hours. Thyroid Function Tests: No results for input(s): TSH, T4TOTAL, FREET4, T3FREE, THYROIDAB in the last 72 hours. Anemia Panel: No results for input(s): VITAMINB12, FOLATE, FERRITIN, TIBC, IRON, RETICCTPCT in the last 72 hours. Sepsis Labs: Recent Labs  Lab 08/09/21 1246 08/09/21 1843 08/09/21 2114  PROCALCITON  --  1.59  --   LATICACIDVEN 2.2* 1.2 1.7    Recent Results (from the past 240 hour(s))  Culture, blood (routine x 2)     Status: None   Collection Time: 08/09/21 12:46 PM   Specimen: BLOOD  Result Value Ref Range Status   Specimen Description   Final    BLOOD RIGHT ANTECUBITAL Performed at [facility] [address], [place], [place] [address]    Special Requests   Final    BOTTLES DRAWN AEROBIC AND ANAEROBIC Blood Culture adequate volume Performed at [facility] [address], [place], [place] [address]    Culture   Final    NO GROWTH 5 DAYS Performed at [facility], [place] [address] [place], [place] [address]    Report Status 08/14/2021 FINAL  Final  Resp Panel by RT-PCR (Flu A&B, Covid) Nasopharyngeal Swab     Status: None   Collection Time: 08/09/21 12:47 PM   Specimen: Nasopharyngeal Swab; Nasopharyngeal(NP) swabs in vial transport medium  Result Value Ref Range Status   SARS Coronavirus  2 by RT PCR NEGATIVE NEGATIVE Final    Comment: (NOTE) SARS-CoV-2 target nucleic acids are NOT DETECTED.  The SARS-CoV-2 RNA is generally detectable  in upper respiratory specimens during the acute phase of infection. The lowest concentration of SARS-CoV-2 viral copies this assay can detect is 138 copies/mL. A negative result does not preclude SARS-Cov-2 infection and should not be used as the sole basis for treatment or other patient management decisions. A negative result may occur with  improper specimen collection/handling, submission of specimen other than nasopharyngeal swab, presence of viral mutation(s) within the areas targeted by this assay, and inadequate number of viral copies(<138 copies/mL). A negative result must be combined with clinical observations, patient history, and epidemiological information. The expected result is Negative.  Fact Sheet for Patients:  EntrepreneurPulse.com.au  Fact Sheet for Healthcare Providers:  IncredibleEmployment.be  This test is no t yet approved or cleared by the [place] FDA and  has been authorized for detection and/or diagnosis of SARS-CoV-2 by FDA under an Emergency Use Authorization (EUA). This EUA will remain  in effect (meaning this test can be used) for the duration of the COVID-19 declaration under Section 564(b)(1) of the Act, 21 U.S.C.section 360bbb-3(b)(1), unless the authorization is terminated  or revoked sooner.       Influenza A by PCR NEGATIVE NEGATIVE Final   Influenza B by PCR NEGATIVE NEGATIVE Final    Comment: (NOTE) The Xpert Xpress SARS-CoV-2/FLU/RSV plus assay is intended as an aid in the diagnosis of influenza from Nasopharyngeal swab specimens and should not be used as a sole basis for treatment. Nasal washings and aspirates are unacceptable for Xpert Xpress SARS-CoV-2/FLU/RSV testing.  Fact Sheet for Patients: EntrepreneurPulse.com.au  Fact Sheet for Healthcare Providers: IncredibleEmployment.be  This test is not yet approved or cleared by the [place] FDA and has  been authorized for detection and/or diagnosis of SARS-CoV-2 by FDA under an Emergency Use Authorization (EUA). This EUA will remain in effect (meaning this test can be used) for the duration of the COVID-19 declaration under Section 564(b)(1) of the Act, 21 U.S.C. section 360bbb-3(b)(1), unless the authorization is terminated or revoked.  Performed at [facility], [place] [address] [place], [place] [address]   Culture, blood (routine x 2)     Status: None   Collection Time: 08/09/21 12:51 PM   Specimen: Right Antecubital; Blood  Result Value Ref Range Status   Specimen Description   Final    RIGHT ANTECUBITAL Performed at [facility] [address] [place], [place] [address]    Special Requests   Final    BOTTLES DRAWN AEROBIC AND ANAEROBIC Blood Culture results may not be optimal due to an inadequate volume of blood received in culture bottles Performed at [facility] [address], [place], [place] [address]    Culture   Final    NO GROWTH 5 DAYS Performed at [facility], [place] [address], [place],  [address]    Report Status 08/14/2021 FINAL  Final    Radiology Studies: No results found.  Scheduled Meds:  sodium chloride   Intravenous Once   sodium chloride   Intravenous Once   ALPRAZolam  0.25 mg Oral Daily   collagenase   Topical Daily   folic acid  2 mg Oral Daily   hydrocerin   Topical Daily   mouth rinse  15 mL Mouth Rinse BID   vitamin B-12  2,000 mcg Oral Daily   Continuous  Infusions:  sodium chloride     lactated ringers       LOS: 6 days    Time spent: 25 mins    [name], MD [facility]   If 7PM-7AM, please contact night-coverage

## 2021-08-16 NOTE — Progress Notes (Signed)
Patient removed IV site during blood administration, so paused infusion. IV team put in a new line and I restarted blood.

## 2021-08-16 NOTE — Progress Notes (Signed)
Pt states she doesn't want to wear any machine. She is pleasant and resting well.

## 2021-08-17 LAB — BPAM RBC
Blood Product Expiration Date: 202212052359
ISSUE DATE / TIME: 202211241223
Unit Type and Rh: 5100

## 2021-08-17 LAB — HEMOGLOBIN AND HEMATOCRIT, BLOOD
HCT: 24.8 % — ABNORMAL LOW (ref 36.0–46.0)
Hemoglobin: 8 g/dL — ABNORMAL LOW (ref 12.0–15.0)

## 2021-08-17 LAB — TYPE AND SCREEN
ABO/RH(D): B POS
Antibody Screen: NEGATIVE
Unit division: 0

## 2021-08-17 NOTE — Progress Notes (Signed)
Physical Therapy Treatment Patient Details Name: [name] MRN: [id number] DOB: Jan 25, 1941 Today's Date: 08/17/2021   History of Present Illness 80 y.o. female with medical history significant of B12 deficiency, bilateral carotid artery gnosis, COPD, diverticulosis/diverticulitis of colon, COPD, former smoker, urolithiasis of the left kidney and ureter, history of idiopathic chronic cold agglutinin disease who was brought to the emergency department via EMS due to decreased mentation, generalized weakness and jaundice. 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    PT Comments    Pt in bed, incontinent of bowel and bladder with bed extensively soiled (with apparent older dried bodily fluids, as well as newly excreted bodily fluids. Purewick in bed with pt however not in place.  Pt was continent of urine and requested BSC later in PT session.    Pt progressing well with mobility today , incr gait distance, she is pleasant and cooperative. Continue to recommend SNF d/t pt cognitve status (she remains oriented only to self), she would be at risk for falls and other incidents if home alone.   Recommendations for follow up therapy are one component of a multi-disciplinary discharge planning process, led by the attending physician.  Recommendations may be updated based on patient status, additional functional criteria and insurance authorization.  Follow Up Recommendations  Skilled nursing-short term rehab (<3 hours/day)     Assistance Recommended at Discharge Frequent or constant Supervision/Assistance  Equipment Recommendations       Recommendations for Other Services       Precautions / Restrictions Precautions Precautions: Fall Restrictions Weight Bearing Restrictions: No     Mobility  Bed Mobility Overal bed mobility: Needs Assistance Bed Mobility: Supine to Sit     Supine to sit: Min assist;Min guard     General bed mobility  comments: light assist/guidance to elevate trunk    Transfers Overall transfer level: Needs assistance Equipment used: Rolling walker (2 wheels) Transfers: Sit to/from Stand;Bed to chair/wheelchair/BSC Sit to Stand: Min assist     Step pivot transfers: Min assist     General transfer comment: chair<> BSC transfer with min assist to balance without device and safely transition, requires assist with pericare. cues for hand placement and safety    Ambulation/Gait Ambulation/Gait assistance: Min assist Gait Distance (Feet): 90 Feet (+30') Assistive device: Rolling walker (2 wheels) Gait Pattern/deviations: Step-through pattern;Decreased stride length       General Gait Details: min assist for balance and to maneuver RW. reliant on UE support, unsteady  however without overt LOB   Stairs             Wheelchair Mobility    Modified Rankin (Stroke Patients Only)       Balance   Sitting-balance support: Feet supported;No upper extremity supported Sitting balance-Leahy Scale: Good     Standing balance support: During functional activity;Bilateral upper extremity supported Standing balance-Leahy Scale: Poor Standing balance comment: reliant on support                            Cognition Arousal/Alertness: Awake/alert Behavior During Therapy: WFL for tasks assessed/performed Overall Cognitive Status: No family/caregiver present to determine baseline cognitive functioning Area of Impairment: Orientation;Attention;Memory;Following commands;Awareness                 Orientation Level: Disoriented to;Situation;Time;Place Current Attention Level: Sustained Memory: Decreased short-term memory Following Commands: Follows one step commands consistently;Follows multi-step commands inconsistently       General  Comments: pleasant and cooperative, continues to be oriented only to self        Exercises      General Comments        Pertinent  Vitals/Pain Pain Assessment: No/denies pain    Home Living                          Prior Function            PT Goals (current goals can now be found in the care plan section) Acute Rehab PT Goals PT Goal Formulation: Patient unable to participate in goal setting Time For Goal Achievement: 08/29/21 Potential to Achieve Goals: Good Progress towards PT goals: Progressing toward goals    Frequency    Min 2X/week      PT Plan Current plan remains appropriate    Co-evaluation              AM-PAC PT "6 Clicks" Mobility   Outcome Measure  Help needed turning from your back to your side while in a flat bed without using bedrails?: A Little Help needed moving from lying on your back to sitting on the side of a flat bed without using bedrails?: A Little Help needed moving to and from a bed to a chair (including a wheelchair)?: A Little Help needed standing up from a chair using your arms (e.g., wheelchair or bedside chair)?: A Little Help needed to walk in hospital room?: A Little Help needed climbing 3-5 steps with a railing? : A Lot 6 Click Score: 17    End of Session Equipment Utilized During Treatment: Gait belt Activity Tolerance: Patient tolerated treatment well Patient left: in chair;with call bell/phone within reach;with chair alarm set   PT Visit Diagnosis: Unsteadiness on feet (R26.81)     Time: 7793-9030 PT Time Calculation (min) (ACUTE ONLY): 23 min  Charges:  $Gait Training: 8-22 mins $Therapeutic Activity: 8-22 mins                     Baxter Flattery, PT  Acute Rehab Dept ([place]) [phone] Pager [phone]  08/17/2021    [facility] 08/17/2021, 12:06 PM

## 2021-08-17 NOTE — Care Management Important Message (Signed)
Important Message  Patient Details IM Letter given to the Patient. Name: KHADEEJA ELDEN MRN: [id number] Date of Birth: Mar 23, 1941   Medicare Important Message Given:  Yes     Kerin Salen 08/17/2021, 12:12 PM

## 2021-08-17 NOTE — NC FL2 (Signed)
[place] LEVEL OF CARE SCREENING TOOL     IDENTIFICATION  Patient Name: [name] Birthdate: 07-Dec-1940 Sex: female Admission Date (Current Location): 08/09/2021  [facility] and [place] Number:  Herbalist and Address:  [facility],  [place], [place]      Provider Number: [id number]  Attending Physician Name and Address:  [name], MD  Relative Name and Phone Number:  [name] (sister) Ph: [phone]    Current Level of Care: Hospital Recommended Level of Care: [place] Prior Approval Number:    Date Approved/Denied:   PASRR Number: [id number]  Discharge Plan: SNF    Current Diagnoses: Patient Active Problem List   Diagnosis Date Noted   Sepsis due to pneumonia ([place]) 08/10/2021   Sepsis due to undetermined organism ([place]) 08/09/2021   Aortic atherosclerosis ([place]) 08/09/2021   COPD (chronic obstructive pulmonary disease) (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 ([place]) 02/04/2018   Occult blood in stools 02/04/2018   Ureteral calculus 11/12/2015   Breast cancer screening, high risk patient 08/29/2015   Acute delirium 08/07/2015   Postoperative anemia due to acute blood loss 08/06/2015   Protein-calorie malnutrition, severe 08/04/2015   Closed intertrochanteric fracture of left femur ([place]) 08/03/2015   Fall 08/03/2015   Closed right hip fracture ([place]) 08/03/2015   Hyperbilirubinemia 08/01/2013   Hypokalemia 07/30/2013   Vitamin B 12 deficiency 08/07/2012   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[place]) 06/10/2012   Tobacco abuse 06/10/2012    Orientation RESPIRATION BLADDER Height & Weight     Self  O2 (2.5L/min) Incontinent Weight: 77 lb 2.6 oz (35 kg) Height:  5\' 1"  (154.9 cm)  BEHAVIORAL SYMPTOMS/MOOD NEUROLOGICAL BOWEL NUTRITION STATUS       Incontinent Diet (Heart healthy)  AMBULATORY STATUS COMMUNICATION OF NEEDS Skin   Extensive Assist Verbally Skin abrasions, Other (Comment) (Abrasion: left leg, anterior chest; Ecchymosis: bilateral arms; Petechiae: bilateral legs)                       Personal Care Assistance Level of Assistance  Bathing, Feeding, Dressing Bathing Assistance: Limited assistance Feeding assistance: Independent Dressing Assistance: Limited assistance     Functional Limitations Info  Sight, Hearing, Speech Sight Info: Adequate Hearing Info: Impaired Speech Info: Adequate    SPECIAL CARE FACTORS FREQUENCY  PT (By licensed PT), OT (By licensed OT)     PT Frequency: 5x's/week OT Frequency: 5x's/week            Contractures Contractures Info: Not present    Additional Factors Info  Code Status, Allergies, Psychotropic Code Status Info: DNR Allergies Info: Shellfish Allergy, Other Psychotropic Info: Haldol, Xanax         Current Medications (08/17/2021):  This is the current hospital active medication list Current Facility-Administered Medications  Medication Dose Route Frequency Provider Last Rate Last Admin   0.9 %  sodium chloride infusion  10 mL/hr Intravenous Once Donne Hazel, MD       ALPRAZolam Duanne Moron) tablet 0.25 mg  0.25 mg Oral Daily Donne Hazel, MD   0.25 mg at 08/17/21 1107   collagenase (SANTYL) ointment   Topical Daily Donne Hazel, MD   Given at 00/37/04 8889   folic acid (FOLVITE) tablet 2 mg  2 mg Oral Daily Donne Hazel, MD  2 mg at 08/17/21 1107   haloperidol lactate (HALDOL) injection 5 mg  5 mg Intravenous Q6H PRN Donne Hazel, MD   5 mg at 08/16/21 1947   hydrocerin (EUCERIN) cream   Topical Daily Donne Hazel, MD   Given at 08/16/21 1950   lactated ringers bolus 250 mL  250 mL Intravenous Once Donne Hazel, MD       lip balm (CARMEX) ointment   Topical PRN Donne Hazel, MD   75 application at 34/28/76 8115   MEDLINE mouth rinse  15 mL  Mouth Rinse BID Donne Hazel, MD   15 mL at 08/15/21 2216   morphine 2 MG/ML injection 1 mg  1 mg Intravenous Q4H PRN Donne Hazel, MD   1 mg at 08/14/21 7262   vitamin B-12 (CYANOCOBALAMIN) tablet 2,000 mcg  2,000 mcg Oral Daily Donne Hazel, MD   2,000 mcg at 08/17/21 1109     Discharge Medications: Please see discharge summary for a list of discharge medications.  Relevant Imaging Results:  Relevant Lab Results:   Additional Information SSN: [id number]  Sherie Don, LCSW

## 2021-08-17 NOTE — Progress Notes (Signed)
PROGRESS NOTE    [name]  AJG:[id number] DOB: [birth year] DOA: 08/09/2021 PCP: [name], MD    Brief Narrative:  This 80 yrs old female with PMH  significant of B12 deficiency, bilateral carotid artery stenosis,COPD, diverticulosis/diverticulitis of colon, COPD, former smoker, urolithiasis of the left kidney and ureter, history of idiopathic chronic cold agglutinin disease who was brought to the emergency department via EMS due to decreased mentation, generalized weakness and jaundice.  EMS found the patient to be hypoxic 88% on room air and placed her on supplemental oxygen. Patient admitted for sepsis and acute hypoxic respiratory failure secondary to pneumonia.  She has completed antibiotics for pneumonia.  She remains hypoxic , requiring  2 L of supplemental oxygen.  She is receiving multiple units of blood transfusion due to hemolytic anemia because of Cold antibody.  Heme-onc is consulted.  PT recommended SNF, TOC working on SNF placement.  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 to pneumonia: Patient presented hypoxic on arrival, 88% on room air, requiring 2 L of supplemental oxygen. Continue supplemental oxygen and wean as tolerated. She has completed antibiotics for pneumonia.  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 Hgb has trended down to 6.8 on 11/22, has received 1 unit warmed blood tx.  Continue cyanocobalamin and folate. Hematology consulted 11/17.  Appreciated input.   Recommended for CBC and all hemolytic labs to be collected in pre  warmed tubes at 37 degrees and run at 37 degrees for accurate results. All blood and fluids to be given through warmer Cont to transfuse for hgb <7 Consideration for Rituxan infusion which could be done as outpatient. Her hemoglobin again dropped to 6.8. on 11/24  Getting 1 unit of PRBC. Reached out to heme-onc to see if Rituxan infusion can be given inpatient.   Sepsis could be secondary to PNA: Patient met sepsis criteria on arrival.  Tachycardia, tachypnea, fever, leukocytosis, lactic acidosis. Chest x-ray showed increased opacity at left lung base may reflect combination of pleural effusion and consolidation Patient has completed 5 days of Zithromax and Rocephin. Patient has developed posttransfusion hypoxia which improved with doses of IV Lasix Sepsis physiology has resolved.  Continue to wean oxygen as tolerated.  Hypokalemia : Replaced and resolved.    COPD: Continue Bronchodilators as needed. Stable. Wean O2 as tolerated, goal is room air.   Elevated liver enzymes: Suspect in the setting of hemolytic anemia. Labs reviewed, liver profile is improving, bilirubin up to 2.7 Repeat cmp in AM   Aortic atherosclerosis Not on medical therapy. Will defer starting statin at the moment. Recommend follow-up with PCP as an outpatient.   Metabolic encephalopathy with underlying dementia: Suspect could be related to sepsis. Family member reports steady and gradual decline prior to this hospitalization with more notable rapid decline leading to this admission. Mentation seems to be near baseline. Continue Haldol as needed.   DVT prophylaxis: SCDs Code Status: DNR Family Communication: No family at bed side. Disposition Plan:   Status is: Inpatient  Remains inpatient appropriate because: Severity of Illness.  Hemolytic anemia requiring blood transfusions. PT recommended SNF, TOC  working on SNF placement.  Consultants:  PCCM Oncology  Procedures: None Antimicrobials:    Anti-infectives (From admission, onward)    Start     Dose/Rate Route Frequency Ordered Stop   08/09/21 2000  cefTRIAXone (ROCEPHIN) 2 g in sodium chloride 0.9 % 100 mL IVPB  Status:  Discontinued        2 g 200 mL/hr over 30 Minutes Intravenous Every 24 hours 08/09/21 1844 08/09/21 1931   08/09/21 2000  azithromycin (ZITHROMAX) 500 mg in sodium chloride 0.9 % 250 mL IVPB        500 mg 250 mL/hr over 60 Minutes Intravenous Every 24 hours 08/09/21 1844 08/13/21 2259   08/09/21 2000  cefTRIAXone (ROCEPHIN) 1 g in sodium chloride 0.9 % 100 mL IVPB        1 g 200 mL/hr over 30 Minutes Intravenous Every 24 hours 08/09/21 1931 08/13/21 2159        Subjective: Patient was seen and examined at bedside.  Overnight events noted.   She is lying comfortably in the bed.  Denies any pain. She remains on 2.5 L of oxygen. Her hemoglobin has been stable above 8.0,  but still trending down.  Objective: Vitals:   08/16/21 1254 08/16/21 1603 08/16/21 2043 08/17/21 0533  BP: (!) 114/53 (!) 114/47 (!) 115/46 (!) 143/54  Pulse: 67 (!) 58 73 75  Resp: '14 16 18 20  ' Temp: 97.6 F (36.4 C) 97.9 F (36.6 C) (!) 97.5 F (36.4 C) 97.8 F (36.6 C)  TempSrc: Oral Oral Oral Oral  SpO2: 97% 100% 97% 97%  Weight:      Height:        Intake/Output Summary (Last 24 hours) at 08/17/2021 1027 Last data filed at 08/16/2021 2123 Gross per 24 hour  Intake 704.67 ml  Output --  Net 704.67 ml   Filed Weights   08/09/21 1244 08/09/21 2107  Weight: 38 kg 35 kg    Examination:  General exam: Chronically ill looking, deconditioned, comfortable, not in any distress Respiratory system: Clear to auscultation bilaterally, respiratory effort normal, RR 14 Cardiovascular system: S1-S2 heard, regular rate and rhythm, no murmur.   Gastrointestinal system: Abdomen is soft, mildly distended, nontender, BS +. Central nervous system: Alert and oriented x 2. No focal neurological deficits. Extremities: No edema, no  cyanosis, no clubbing. Skin: No rashes, lesions or ulcers Psychiatry: Judgement and insight appear normal. Mood & affect appropriate.     Data Reviewed: I have personally reviewed following labs and imaging studies  CBC: Recent Labs  Lab 08/13/21 0521 08/13/21 1126 08/14/21 0504 08/14/21 2012 08/15/21 0532 08/15/21 1208 08/16/21 0606 08/16/21 1737 08/17/21 0529  WBC 11.7* 13.1* 11.4*  --  12.9*  --  11.8*  --   --   HGB 7.1* 7.8* 6.4*   < > 7.1* 7.4* 6.5* 8.9* 8.0*  HCT 21.1* 24.0* 19.4*   < > 21.9* 23.1* 20.8* 25.7* 24.8*  MCV 100.0 95.6 95.6  --  92.8  --  95.0  --   --   PLT 440* 513* 471*  --  491*  --  567*  --   --    < > = values in this interval not displayed.   Basic Metabolic Panel: Recent Labs  Lab 08/11/21 0257 08/12/21 0258 08/13/21 0521 08/15/21 0532 08/16/21 0606  NA 143 141 137 138 138  K 3.4* 3.3* 4.1 3.7 4.0  CL 101 95* 98 98 98  CO2 32 33* 30 31 32  GLUCOSE 121* 98 100* 103* 85  BUN 27* '23 13 15 13  ' CREATININE 0.53 0.52 0.48 0.48 0.36*  CALCIUM 7.5* 7.9* 7.8* 8.0* 8.1*  MG  --   --   --   --  1.8  PHOS  --   --   --   --  3.0   GFR: Estimated Creatinine Clearance: 31.5 mL/min (A) (by C-G formula based on SCr of 0.36 mg/dL (L)). Liver Function Tests: Recent Labs  Lab 08/11/21 0257 08/12/21 0258 08/13/21 0521 08/15/21 0532 08/16/21 0606  AST 29 39 '31 16 17  ' ALT 34 39 35 21 20  ALKPHOS 77 85 78 75 87  BILITOT 2.4* 1.9* 1.5* 2.1* 2.7*  PROT 5.5* 6.2* 5.4* 5.5* 5.6*  ALBUMIN 2.8* 3.3* 2.8* 2.9* 3.0*   No results for input(s): LIPASE, AMYLASE in the last 168 hours. No results for input(s): AMMONIA in the last 168 hours.  Coagulation Profile: No results for input(s): INR, PROTIME in the last 168 hours.  Cardiac Enzymes: No results for input(s): CKTOTAL, CKMB, CKMBINDEX, TROPONINI in the last 168 hours. BNP (last 3 results) No results for input(s): PROBNP in the last 8760 hours. HbA1C: No results for input(s): HGBA1C in the last 72  hours. CBG: Recent Labs  Lab 08/10/21 1627 08/11/21 1943  GLUCAP 111* 96   Lipid Profile: No results for input(s): CHOL, HDL, LDLCALC, TRIG, CHOLHDL, LDLDIRECT in the last 72 hours. Thyroid Function Tests: No results for input(s): TSH, T4TOTAL, FREET4, T3FREE, THYROIDAB in the last 72 hours. Anemia Panel: No results for input(s): VITAMINB12, FOLATE, FERRITIN, TIBC, IRON, RETICCTPCT in the last 72 hours. Sepsis Labs: No results for input(s): PROCALCITON, LATICACIDVEN in the last 168 hours.   Recent Results (from the past 240 hour(s))  Culture, blood (routine x 2)     Status: None   Collection Time: 08/09/21 12:46 PM   Specimen: BLOOD  Result Value Ref Range Status   Specimen Description   Final    BLOOD RIGHT ANTECUBITAL Performed at [facility] [address] [place], [place] [address]    Special Requests   Final    BOTTLES DRAWN AEROBIC AND ANAEROBIC Blood Culture adequate volume Performed at [facility] [address], [place], [place] [address]    Culture   Final    NO GROWTH 5 DAYS Performed at [facility], [place] [address] [place], [place] [address]    Report Status 08/14/2021 FINAL  Final  Resp Panel by RT-PCR (Flu A&B, Covid) Nasopharyngeal Swab     Status: None   Collection Time: 08/09/21 12:47 PM   Specimen: Nasopharyngeal Swab; Nasopharyngeal(NP) swabs in vial transport medium  Result Value Ref Range Status   SARS Coronavirus 2 by RT PCR NEGATIVE NEGATIVE Final    Comment: (NOTE) SARS-CoV-2 target nucleic acids are NOT DETECTED.  The SARS-CoV-2 RNA is generally detectable in upper respiratory specimens during the acute phase of infection. The lowest concentration of SARS-CoV-2 viral copies this assay can detect is 138 copies/mL. A negative result does not preclude SARS-Cov-2 infection and should not be used as the sole basis for treatment or other patient management decisions. A negative result may occur with   improper specimen collection/handling, submission of specimen other than nasopharyngeal swab, presence of viral mutation(s) within the areas targeted by this assay, and inadequate number of viral copies(<138 copies/mL). A negative result must be combined with clinical observations, patient history, and epidemiological information. The expected result is Negative.  Fact Sheet for Patients:  EntrepreneurPulse.com.au  Fact Sheet for Healthcare Providers:  IncredibleEmployment.be  This test is no t yet approved or cleared by the [place] FDA and  has been authorized for detection and/or diagnosis of SARS-CoV-2 by FDA under an Emergency Use Authorization (EUA). This EUA will remain  in effect (meaning this test can be used) for the duration of the COVID-19 declaration under Section 564(b)(1) of the Act, 21 U.S.C.section 360bbb-3(b)(1), unless the authorization is terminated  or revoked sooner.       Influenza A by PCR NEGATIVE NEGATIVE Final   Influenza B by PCR NEGATIVE NEGATIVE Final    Comment: (NOTE) The Xpert Xpress SARS-CoV-2/FLU/RSV plus assay is intended as an aid in the diagnosis of influenza from Nasopharyngeal swab specimens and should not be used as a sole basis for treatment. Nasal washings and aspirates are unacceptable for Xpert Xpress SARS-CoV-2/FLU/RSV testing.  Fact Sheet for Patients: EntrepreneurPulse.com.au  Fact Sheet for Healthcare Providers: IncredibleEmployment.be  This test is not yet approved or cleared by the [place] FDA and has been authorized for detection and/or diagnosis of SARS-CoV-2 by FDA under an Emergency Use Authorization (EUA). This EUA will remain in effect (meaning this test can be used) for the duration of the COVID-19 declaration under Section 564(b)(1) of the Act, 21 U.S.C. section 360bbb-3(b)(1), unless the authorization is terminated  or revoked.  Performed at [facility], [place] [address], [place], [place] [address]   Culture, blood (routine x 2)     Status: None   Collection Time: 08/09/21 12:51 PM   Specimen: Right Antecubital; Blood  Result Value Ref Range Status   Specimen Description   Final    RIGHT ANTECUBITAL Performed at [facility] [address], [place], [place] [address]    Special Requests   Final    BOTTLES DRAWN AEROBIC AND ANAEROBIC Blood Culture results may not be optimal due to an inadequate volume of blood received in culture bottles Performed at [facility] [address], [place], [place] [address]    Culture   Final    NO GROWTH 5 DAYS Performed at [facility], [place] [address], [place], [place] [address]    Report Status 08/14/2021 FINAL  Final    Radiology Studies: No results found.  Scheduled Meds:  ALPRAZolam  0.25 mg Oral Daily   collagenase   Topical Daily   folic acid  2 mg Oral Daily   hydrocerin   Topical Daily   mouth rinse  15 mL Mouth Rinse BID   vitamin B-12  2,000 mcg Oral Daily   Continuous Infusions:  sodium chloride     lactated ringers       LOS: 7 days    Time spent: 25 mins    [name], MD [facility]   If 7PM-7AM, please contact night-coverage

## 2021-08-17 NOTE — Progress Notes (Signed)
Patient does not want to wear cpap while she is here

## 2021-08-17 NOTE — TOC Initial Note (Signed)
Transition of Care [facility]) - Initial/Assessment Note   Patient Details  Name: [name] MRN: [id number] Date of Birth: April 22, 1941  [facility]) CM/SW Contact:    Sherie Don, LCSW Phone Number: 08/17/2021, 10:09 AM  Clinical Narrative: PT evaluation recommended SNF. CSW spoke with patient's sister, [name], to discuss recommendations as patient is oriented to person only. Sister is agreeable to SNF.  FL2 done; PASRR verified. Initial referral faxed out. Patient has not received any bed offers at this time, so CSW updated sister. TOC awaiting bed offers.  Expected Discharge Plan: Skilled Nursing Facility Barriers to Discharge: Continued Medical Work up, SNF Pending bed offer  Patient Goals and CMS Choice Patient states their goals for this hospitalization and ongoing recovery are:: Go to rehab CMS Medicare.gov Compare Post Acute Care list provided to:: Patient Represenative (must comment) [name] (sister)) Choice offered to / list presented to : Sibling  Expected Discharge Plan and Services Expected Discharge Plan: [place] In-house Referral: Clinical Social Work Post Acute Care Choice: [place] Living arrangements for the past 2 months: [place]           DME Arranged: N/A DME Agency: NA  Prior Living Arrangements/Services Living arrangements for the past 2 months: Single Family Home Do you feel safe going back to the place where you live?: Yes      Need for Family Participation in Patient Care: Yes (Comment) (Patient has AMS at this time.) Care giver support system in place?: Yes (comment) Criminal Activity/Legal Involvement Pertinent to Current Situation/Hospitalization: No - Comment as needed  Activities of Daily Living Home Assistive Devices/Equipment: Cane (specify quad or straight), Walker (specify type), Wheelchair (walk in shower, bench seat in shower) ADL Screening (condition at time of  admission) Patient's cognitive ability adequate to safely complete daily activities?: No Is the patient deaf or have difficulty hearing?: No Does the patient have difficulty seeing, even when wearing glasses/contacts?: No Does the patient have difficulty concentrating, remembering, or making decisions?: Yes (has some dementia) Patient able to express need for assistance with ADLs?: No Does the patient have difficulty dressing or bathing?: Yes Independently performs ADLs?: No Communication: Needs assistance Is this a change from baseline?: Pre-admission baseline Dressing (OT): Needs assistance Is this a change from baseline?: Pre-admission baseline Grooming: Needs assistance Is this a change from baseline?: Pre-admission baseline Feeding: Needs assistance Is this a change from baseline?: Change from baseline, expected to last >3 days Bathing: Needs assistance Is this a change from baseline?: Pre-admission baseline Toileting: Needs assistance Is this a change from baseline?: Pre-admission baseline In/Out Bed: Needs assistance Is this a change from baseline?: Pre-admission baseline Walks in Home: Dependent Is this a change from baseline?: Change from baseline, expected to last >3 days Does the patient have difficulty walking or climbing stairs?: Yes Weakness of Legs: Both Weakness of Arms/Hands: Both  Permission Sought/Granted Permission sought to share information with : Facility Art therapist granted to share information with : Yes, Verbal Permission Granted Permission granted to share info w AGENCY: SNFs  Emotional Assessment Appearance:: Appears stated age Attitude/Demeanor/Rapport: Unable to Assess Affect (typically observed): Unable to Assess Orientation: : Oriented to Self Alcohol / Substance Use: Not Applicable Psych Involvement: No (comment)  Admission diagnosis:  Hypokalemia [E87.6] 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) [D59.12] Cold  agglutinin disease ([place]) [D59.12] Jaundice, hemolytic (HCC) [D59.9] Hypoxia [R09.02] Pleural effusion, bilateral [J90] Symptomatic anemia [D64.9] Sepsis due to pneumonia ([place]) [J18.9,  A41.9] Patient Active Problem List   Diagnosis Date Noted   Sepsis due to pneumonia ([place]) 08/10/2021   Sepsis due to undetermined organism ([place]) 08/09/2021   Aortic atherosclerosis ([place]) 08/09/2021   COPD (chronic obstructive pulmonary disease) (HCC)    Elevated transaminase level    Symptomatic anemia 05/28/2018   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 ([place]) 02/04/2018   Occult blood in stools 02/04/2018   Ureteral calculus 11/12/2015   Breast cancer screening, high risk patient 08/29/2015   Acute delirium 08/07/2015   Postoperative anemia due to acute blood loss 08/06/2015   Protein-calorie malnutrition, severe 08/04/2015   Closed intertrochanteric fracture of left femur ([place]) 08/03/2015   Fall 08/03/2015   Closed right hip fracture ([place]) 08/03/2015   Hyperbilirubinemia 08/01/2013   Hypokalemia 07/30/2013   Vitamin B 12 deficiency 08/07/2012   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[place]) 06/10/2012   Tobacco abuse 06/10/2012   PCP:  [name], MD Pharmacy:   [id number] - [place], [place] [address] [place] [address] Phone: [phone] Fax: [phone]  Readmission Risk Interventions No flowsheet data found.

## 2021-08-18 LAB — CBC
HCT: 27.2 % — ABNORMAL LOW (ref 36.0–46.0)
Hemoglobin: 10 g/dL — ABNORMAL LOW (ref 12.0–15.0)
MCH: 36.4 pg — ABNORMAL HIGH (ref 26.0–34.0)
MCHC: 36.8 g/dL — ABNORMAL HIGH (ref 30.0–36.0)
MCV: 98.9 fL (ref 80.0–100.0)
Platelets: 660 10*3/uL — ABNORMAL HIGH (ref 150–400)
RBC: 2.75 MIL/uL — ABNORMAL LOW (ref 3.87–5.11)
RDW: 23 % — ABNORMAL HIGH (ref 11.5–15.5)
WBC: 12.2 10*3/uL — ABNORMAL HIGH (ref 4.0–10.5)
nRBC: 0.5 % — ABNORMAL HIGH (ref 0.0–0.2)

## 2021-08-18 NOTE — Progress Notes (Signed)
CPAP  held due to ongoing confusion and attempts to get out of bed.

## 2021-08-18 NOTE — TOC Progression Note (Addendum)
Transition of Care [facility]) - Progression Note    Patient Details  Name: KASHAY CAVENAUGH MRN: [id number] Date of Birth: 10-07-1940  Transition of Care [facility]) CM/SW Contact  Ross Ludwig, [place] Phone Number: 08/18/2021, 12:48 PM  Clinical Narrative:     CSW spoke to patient's sister [name] [phone], informed her that so far patient only has one bed offer which was [facility].  Patient's sister said she does not want that facility.  CSW informed her that there are still a few pending, and CSW will follow up at either tomorrow or Monday.  CSW explained to patient's sister, if she does not like any of the other options, the only other choice will be home with home health.  CSW to continue to follow patient's progress throughout discharge planning.   Expected Discharge Plan: [place] Barriers to Discharge: Continued Medical Work up, SNF Pending bed offer  Expected Discharge Plan and Services Expected Discharge Plan: [place] In-house Referral: Clinical Social Work   Post Acute Care Choice: [place] Living arrangements for the past 2 months: Single Family Home                 DME Arranged: N/A DME Agency: NA                   Social Determinants of Health (SDOH) Interventions    Readmission Risk Interventions No flowsheet data found.

## 2021-08-18 NOTE — Progress Notes (Signed)
PROGRESS NOTE    [name]  ASU:[id number] DOB: 06/20/1941 DOA: 08/09/2021 PCP: [name], MD    Brief Narrative:  This 80 yrs old female with PMH  significant of B12 deficiency, bilateral carotid artery stenosis,COPD, diverticulosis/diverticulitis of colon, COPD, former smoker, urolithiasis of the left kidney and ureter, history of idiopathic chronic cold agglutinin disease who was brought to the emergency department via EMS due to decreased mentation, generalized weakness and jaundice.  EMS found the patient to be hypoxic 88% on room air and placed her on supplemental oxygen. Patient admitted for sepsis and acute hypoxic respiratory failure secondary to pneumonia.  She has completed antibiotics for pneumonia.  She remains hypoxic , requiring  2 L of supplemental oxygen.  She is receiving multiple units of blood transfusion due to hemolytic anemia because of Cold antibody.  Heme-onc is consulted.  PT recommended SNF, TOC working on SNF placement.  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 to pneumonia: Patient presented hypoxic on arrival, 88% on room air, requiring 2 L of supplemental oxygen. Continue supplemental oxygen and wean as tolerated. She has completed antibiotics for pneumonia.  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 Hgb has trended down to 6.8 on 11/22, has received 1 unit warmed blood tx.  Continue cyanocobalamin and folate. Hematology consulted 11/17.  Appreciated input.   Recommended for CBC and all hemolytic labs to be collected in pre  warmed tubes at 37 degrees and run at 37 degrees for accurate results. All blood and fluids to be given through warmer Cont to transfuse for hgb <7 Consideration for Rituxan infusion which could be done as outpatient. Her hemoglobin again dropped to 6.8. on 11/24  Getting 1 unit of PRBC. Reached out to heme-onc to see if Rituxan infusion can be given inpatient. Hematologist responded Rituxan infusion would be given outpatient. Hemoglobin has back to normal. Hb 10.0   Sepsis could be secondary to PNA: Patient met sepsis criteria on arrival.  Tachycardia, tachypnea, fever, leukocytosis, lactic acidosis. Chest x-ray showed increased opacity at left lung base may reflect combination of pleural effusion and consolidation Patient has completed 5 days of Zithromax and Rocephin. Patient has developed posttransfusion hypoxia which improved with doses of IV Lasix Sepsis physiology has resolved.  Continue to wean oxygen as tolerated.  Hypokalemia : Replaced and resolved.    COPD: Continue Bronchodilators as needed. Stable. Wean O2 as tolerated, goal is room air.   Elevated liver enzymes: Suspect in the setting of hemolytic anemia. Labs reviewed, liver profile is improving, bilirubin up to 2.7 Repeat cmp in AM   Aortic atherosclerosis Not on medical therapy. Will defer starting statin at the moment. Recommend follow-up with PCP as an outpatient.   Metabolic encephalopathy with underlying dementia: Suspect could be related to sepsis. Family member reports steady and gradual decline prior to this hospitalization with more notable rapid decline leading to this admission. Mentation seems to be near baseline. Continue Haldol as needed.   DVT prophylaxis: SCDs Code Status: DNR Family Communication: No family at bed side. Disposition Plan:   Status is: Inpatient  Remains inpatient  appropriate because: Severity of Illness.  Hemolytic anemia requiring blood transfusions. PT recommended  SNF, TOC working on SNF placement.  Consultants:  PCCM Oncology  Procedures: None Antimicrobials:   Anti-infectives (From admission, onward)    Start     Dose/Rate Route Frequency Ordered Stop   08/09/21 2000  cefTRIAXone (ROCEPHIN) 2 g in sodium chloride 0.9 % 100 mL IVPB  Status:  Discontinued        2 g 200 mL/hr over 30 Minutes Intravenous Every 24 hours 08/09/21 1844 08/09/21 1931   08/09/21 2000  azithromycin (ZITHROMAX) 500 mg in sodium chloride 0.9 % 250 mL IVPB        500 mg 250 mL/hr over 60 Minutes Intravenous Every 24 hours 08/09/21 1844 08/13/21 2259   08/09/21 2000  cefTRIAXone (ROCEPHIN) 1 g in sodium chloride 0.9 % 100 mL IVPB        1 g 200 mL/hr over 30 Minutes Intravenous Every 24 hours 08/09/21 1931 08/13/21 2159        Subjective: Patient was seen and examined at bedside.  Overnight events noted.   She remains on 2.5 L of oxygen. She is lying comfortably on the bed.  Having breakfast.   Her hemoglobin has been stable now 10.0.   Objective: Vitals:   08/17/21 0533 08/17/21 1817 08/17/21 2155 08/18/21 0522  BP: (!) 143/54 (!) 129/48 (!) 121/42 (!) 117/50  Pulse: 75 81 74 73  Resp: '20 17 14 14  ' Temp: 97.8 F (36.6 C) 97.9 F (36.6 C) 97.7 F (36.5 C) 97.8 F (36.6 C)  TempSrc: Oral Oral Oral Oral  SpO2: 97% (!) 88% 95% 97%  Weight:      Height:        Intake/Output Summary (Last 24 hours) at 08/18/2021 1111 Last data filed at 08/18/2021 0259 Gross per 24 hour  Intake 240 ml  Output --  Net 240 ml   Filed Weights   08/09/21 1244 08/09/21 2107  Weight: 38 kg 35 kg    Examination:  General exam: Deconditioned, chronically ill looking, NAD, appears comfortable.   Respiratory system: Clear to auscultation bilaterally, respiratory effort normal, RR 14 Cardiovascular system: S1-S2 heard, regular rate and rhythm, no murmur.   Gastrointestinal system: Abdomen is soft, mildly distended, nontender, BS +. Central nervous system: Alert and  oriented x 2. No focal neurological deficits. Extremities: No edema, no cyanosis, no clubbing. Skin: No rashes, lesions or ulcers Psychiatry: Judgement and insight appear normal. Mood & affect appropriate.     Data Reviewed: I have personally reviewed following labs and imaging studies  CBC: Recent Labs  Lab 08/13/21 1126 08/14/21 0504 08/14/21 2012 08/15/21 0532 08/15/21 1208 08/16/21 0606 08/16/21 1737 08/17/21 0529 08/18/21 0607  WBC 13.1* 11.4*  --  12.9*  --  11.8*  --   --  12.2*  HGB 7.8* 6.4*   < > 7.1* 7.4* 6.5* 8.9* 8.0* 10.0*  HCT 24.0* 19.4*   < > 21.9* 23.1* 20.8* 25.7* 24.8* 27.2*  MCV 95.6 95.6  --  92.8  --  95.0  --   --  98.9  PLT 513* 471*  --  491*  --  567*  --   --  660*   < > = values in this interval not displayed.   Basic Metabolic Panel: Recent Labs  Lab 08/12/21 0258 08/13/21 0521 08/15/21 0532 08/16/21 0606  NA 141 137 138 138  K 3.3* 4.1 3.7 4.0  CL 95* 98 98 98  CO2  33* 30 31 32  GLUCOSE 98 100* 103* 85  BUN '23 13 15 13  ' CREATININE 0.52 0.48 0.48 0.36*  CALCIUM 7.9* 7.8* 8.0* 8.1*  MG  --   --   --  1.8  PHOS  --   --   --  3.0   GFR: Estimated Creatinine Clearance: 31.5 mL/min (A) (by C-G formula based on SCr of 0.36 mg/dL (L)). Liver Function Tests: Recent Labs  Lab 08/12/21 0258 08/13/21 0521 08/15/21 0532 08/16/21 0606  AST 39 '31 16 17  ' ALT 39 35 21 20  ALKPHOS 85 78 75 87  BILITOT 1.9* 1.5* 2.1* 2.7*  PROT 6.2* 5.4* 5.5* 5.6*  ALBUMIN 3.3* 2.8* 2.9* 3.0*   No results for input(s): LIPASE, AMYLASE in the last 168 hours. No results for input(s): AMMONIA in the last 168 hours.  Coagulation Profile: No results for input(s): INR, PROTIME in the last 168 hours.  Cardiac Enzymes: No results for input(s): CKTOTAL, CKMB, CKMBINDEX, TROPONINI in the last 168 hours. BNP (last 3 results) No results for input(s): PROBNP in the last 8760 hours. HbA1C: No results for input(s): HGBA1C in the last 72 hours. CBG: Recent Labs   Lab 08/11/21 1943  GLUCAP 96   Lipid Profile: No results for input(s): CHOL, HDL, LDLCALC, TRIG, CHOLHDL, LDLDIRECT in the last 72 hours. Thyroid Function Tests: No results for input(s): TSH, T4TOTAL, FREET4, T3FREE, THYROIDAB in the last 72 hours. Anemia Panel: No results for input(s): VITAMINB12, FOLATE, FERRITIN, TIBC, IRON, RETICCTPCT in the last 72 hours. Sepsis Labs: No results for input(s): PROCALCITON, LATICACIDVEN in the last 168 hours.   Recent Results (from the past 240 hour(s))  Culture, blood (routine x 2)     Status: None   Collection Time: 08/09/21 12:46 PM   Specimen: BLOOD  Result Value Ref Range Status   Specimen Description   Final    BLOOD RIGHT ANTECUBITAL Performed at [facility] [address] [place], [place] [address]    Special Requests   Final    BOTTLES DRAWN AEROBIC AND ANAEROBIC Blood Culture adequate volume Performed at [facility] [address], [place], [place] [address]    Culture   Final    NO GROWTH 5 DAYS Performed at [facility], [place] [address] [place],  [address]    Report Status 08/14/2021 FINAL  Final  Resp Panel by RT-PCR (Flu A&B, Covid) Nasopharyngeal Swab     Status: None   Collection Time: 08/09/21 12:47 PM   Specimen: Nasopharyngeal Swab; Nasopharyngeal(NP) swabs in vial transport medium  Result Value Ref Range Status   SARS Coronavirus 2 by RT PCR NEGATIVE NEGATIVE Final    Comment: (NOTE) SARS-CoV-2 target nucleic acids are NOT DETECTED.  The SARS-CoV-2 RNA is generally detectable in upper respiratory specimens during the acute phase of infection. The lowest concentration of SARS-CoV-2 viral copies this assay can detect is 138 copies/mL. A negative result does not preclude SARS-Cov-2 infection and should not be used as the sole basis for treatment or other patient management decisions. A negative result may occur with  improper specimen collection/handling,  submission of specimen other than nasopharyngeal swab, presence of viral mutation(s) within the areas targeted by this assay, and inadequate number of viral copies(<138 copies/mL). A negative result must be combined with clinical observations, patient history, and epidemiological information. The expected result is Negative.  Fact Sheet for Patients:  EntrepreneurPulse.com.au  Fact Sheet for Healthcare Providers:  IncredibleEmployment.be  This  test is no t yet approved or cleared by the [place] and  has been authorized for detection and/or diagnosis of SARS-CoV-2 by FDA under an Emergency Use Authorization (EUA). This EUA will remain  in effect (meaning this test can be used) for the duration of the COVID-19 declaration under Section 564(b)(1) of the Act, 21 U.S.C.section 360bbb-3(b)(1), unless the authorization is terminated  or revoked sooner.       Influenza A by PCR NEGATIVE NEGATIVE Final   Influenza B by PCR NEGATIVE NEGATIVE Final    Comment: (NOTE) The Xpert Xpress SARS-CoV-2/FLU/RSV plus assay is intended as an aid in the diagnosis of influenza from Nasopharyngeal swab specimens and should not be used as a sole basis for treatment. Nasal washings and aspirates are unacceptable for Xpert Xpress SARS-CoV-2/FLU/RSV testing.  Fact Sheet for Patients: EntrepreneurPulse.com.au  Fact Sheet for Healthcare Providers: IncredibleEmployment.be  This test is not yet approved or cleared by the [place] FDA and has been authorized for detection and/or diagnosis of SARS-CoV-2 by FDA under an Emergency Use Authorization (EUA). This EUA will remain in effect (meaning this test can be used) for the duration of the COVID-19 declaration under Section 564(b)(1) of the Act, 21 U.S.C. section 360bbb-3(b)(1), unless the authorization is terminated or revoked.  Performed at [facility], [place] [address], [place], [place] [address]   Culture, blood (routine x 2)     Status: None   Collection Time: 08/09/21 12:51 PM   Specimen: Right Antecubital; Blood  Result Value Ref Range Status   Specimen Description   Final    RIGHT ANTECUBITAL Performed at [facility] [address] [place], [place] [address]    Special Requests   Final    BOTTLES DRAWN AEROBIC AND ANAEROBIC Blood Culture results may not be optimal due to an inadequate volume of blood received in culture bottles Performed at [facility] [address] [place], [place] [address]    Culture   Final    NO GROWTH 5 DAYS Performed at [facility], [place] [address], [place],  [address]    Report Status 08/14/2021 FINAL  Final    Radiology Studies: No results found.  Scheduled Meds:  ALPRAZolam  0.25 mg Oral Daily   collagenase   Topical Daily   folic acid  2 mg Oral Daily   hydrocerin   Topical Daily   mouth rinse  15 mL Mouth Rinse BID   vitamin B-12  2,000 mcg Oral Daily   Continuous Infusions:  sodium chloride     lactated ringers       LOS: 8 days    Time spent: 25 mins    [name], MD [facility]   If 7PM-7AM, please contact night-coverage

## 2021-08-18 NOTE — Progress Notes (Signed)
Mobility Specialist - Progress Note    08/18/21 1059  Mobility  Activity Ambulated in hall  Level of Assistance Contact guard assist, steadying assist  Assistive Device Front wheel walker  Distance Ambulated (ft) 100 ft  Mobility Ambulated with assistance in hallway  Mobility Response Tolerated well  Mobility performed by Mobility specialist  $Mobility charge 1 Mobility   Pt agreeable to mobilizing and required no assistance to sit EOB or stand at EOB. Pt used RW to ambulate ~100 ft in hallway and required contact guard assist due to constant drifting. Pt did report feeling L leg pain, but gave no pain rating. 1 standing rest break taken during session. Pt returned to sit EOB after session and was left with call bell at side and sitter in room.   [place] Specialist Acute [facility] Services Phone: [phone] 08/18/21, 11:02 AM

## 2021-08-19 LAB — CBC
HCT: 22.8 % — ABNORMAL LOW (ref 36.0–46.0)
Hemoglobin: 8.3 g/dL — ABNORMAL LOW (ref 12.0–15.0)
MCH: 35.3 pg — ABNORMAL HIGH (ref 26.0–34.0)
MCHC: 36.4 g/dL — ABNORMAL HIGH (ref 30.0–36.0)
MCV: 97 fL (ref 80.0–100.0)
Platelets: 619 10*3/uL — ABNORMAL HIGH (ref 150–400)
RBC: 2.35 MIL/uL — ABNORMAL LOW (ref 3.87–5.11)
RDW: 20 % — ABNORMAL HIGH (ref 11.5–15.5)
WBC: 10.3 10*3/uL (ref 4.0–10.5)
nRBC: 0 % (ref 0.0–0.2)

## 2021-08-19 LAB — HEPATIC FUNCTION PANEL
ALT: 17 U/L (ref 0–44)
AST: 19 U/L (ref 15–41)
Albumin: 3.3 g/dL — ABNORMAL LOW (ref 3.5–5.0)
Alkaline Phosphatase: 95 U/L (ref 38–126)
Bilirubin, Direct: 0.4 mg/dL — ABNORMAL HIGH (ref 0.0–0.2)
Indirect Bilirubin: 1.9 mg/dL — ABNORMAL HIGH (ref 0.3–0.9)
Total Bilirubin: 2.3 mg/dL — ABNORMAL HIGH (ref 0.3–1.2)
Total Protein: 5.8 g/dL — ABNORMAL LOW (ref 6.5–8.1)

## 2021-08-19 NOTE — Progress Notes (Signed)
Pt unable to utilize CPAP QHS due to AMS.

## 2021-08-19 NOTE — Progress Notes (Signed)
PROGRESS NOTE    LONDAN COPLEN  WUJ:[id number] DOB: [birth year] DOA: 08/09/2021 PCP: [name], MD    Brief Narrative:  This 80 yrs old female with PMH  significant of B12 deficiency, bilateral carotid artery stenosis,COPD, diverticulosis/diverticulitis of colon, COPD, former smoker, urolithiasis of the left kidney and ureter, history of idiopathic chronic cold agglutinin disease who was brought to the emergency department via EMS due to decreased mentation, generalized weakness and jaundice.  EMS found the patient to be hypoxic 88% on room air and placed her on supplemental oxygen. Patient admitted for sepsis and acute hypoxic respiratory failure secondary to pneumonia.  She has completed antibiotics for pneumonia.  She remains hypoxic , requiring  2 L of supplemental oxygen.  She is receiving multiple units of blood transfusion due to hemolytic anemia because of Cold antibody.  Heme-onc is consulted.  PT recommended SNF, TOC working on SNF placement.  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 to pneumonia: Patient presented hypoxic on arrival, 88% on room air, now requiring 2 L of supplemental oxygen. Continue supplemental oxygen and wean as tolerated. She has completed antibiotics for pneumonia.  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 Hgb has trended down to 6.8 on 11/22, has received 1 unit warmed blood tx.  Continue cyanocobalamin and folate. Hematology consulted 11/17.  Appreciated input.   Recommended for CBC and all hemolytic labs to be collected in  pre warmed tubes at 37 degrees and run at 37 degrees for accurate results. All blood and fluids to be given through warmer Cont to transfuse for hgb <7 Consideration for Rituxan infusion which could be done as outpatient. Her hemoglobin again dropped to 6.8. on 11/24  Getting 1 unit of PRBC. Reached out to heme-onc to see if Rituxan infusion can be given inpatient. Hematologist responded Rituxan infusion would be given outpatient. Hemoglobin has back to normal. Hb 10.0 > 8.3   Sepsis could be secondary to PNA: Patient met sepsis criteria on arrival.  Tachycardia, tachypnea, fever, leukocytosis, lactic acidosis. Chest x-ray showed increased opacity at left lung base may reflect combination of pleural effusion and consolidation Patient has completed 5 days of Zithromax and Rocephin. Patient has developed posttransfusion hypoxia which improved with doses of IV Lasix Sepsis physiology has resolved.  Continue to wean oxygen as tolerated.  Hypokalemia : Replaced .  Continue to monitor    COPD: Continue Bronchodilators as needed. Stable. Wean O2 as tolerated, goal is room air.   Elevated liver enzymes: Suspect in the setting of hemolytic anemia. Labs reviewed, liver profile is improving, bilirubin up to 2.7>2.3 Repeat cmp in AM   Aortic atherosclerosis Not on medical therapy. Will defer starting statin at the moment. Recommend follow-up with PCP as an outpatient.   Metabolic encephalopathy with underlying dementia: Suspect could be related to sepsis. Family member reports steady and gradual decline prior to this hospitalization with more notable rapid decline leading to this admission. Mentation seems to be near baseline. Continue Haldol as needed.   DVT prophylaxis: SCDs Code Status: DNR Family Communication: No family at bed side. Disposition Plan:  Status is: Inpatient  Remains inpatient appropriate because: Severity of Illness.  Hemolytic anemia requiring blood  transfusions. PT recommended SNF, TOC working on SNF placement.  Consultants:  PCCM Oncology  Procedures: None Antimicrobials:   Anti-infectives (From admission, onward)    Start     Dose/Rate Route Frequency Ordered Stop   08/09/21 2000  cefTRIAXone (ROCEPHIN) 2 g in sodium chloride 0.9 % 100 mL IVPB  Status:  Discontinued        2 g 200 mL/hr over 30 Minutes Intravenous Every 24 hours 08/09/21 1844 08/09/21 1931   08/09/21 2000  azithromycin (ZITHROMAX) 500 mg in sodium chloride 0.9 % 250 mL IVPB        500 mg 250 mL/hr over 60 Minutes Intravenous Every 24 hours 08/09/21 1844 08/13/21 2259   08/09/21 2000  cefTRIAXone (ROCEPHIN) 1 g in sodium chloride 0.9 % 100 mL IVPB        1 g 200 mL/hr over 30 Minutes Intravenous Every 24 hours 08/09/21 1931 08/13/21 2159        Subjective: Patient was seen and examined at bedside.  Overnight events noted.   She is lying comfortably in the bed.She still remains on 2.5 L of supplemental oxygen to maintain saturation above 94%. Her hemoglobin slightly down to 8.3, remains improved.   Objective: Vitals:   08/18/21 0522 08/18/21 1725 08/18/21 2111 08/19/21 0646  BP: (!) 117/50 (!) 124/52 (!) 124/43 (!) 131/47  Pulse: 73 75 73 76  Resp: _0 Temp: 97.8 F (36.6 C) 97.6 F (36.4 C) 97.7 F (36.5 C) (!) 97.4 F (36.3 C)  TempSrc: Oral Oral Oral Oral  SpO2: 97% 98% 97% 95%  Weight:      Height:        Intake/Output Summary (Last 24 hours) at 08/19/2021 1242 Last data filed at 08/19/2021 0930 Gross per 24 hour  Intake 360 ml  Output 1000 ml  Net -640 ml   Filed Weights   08/09/21 1244 08/09/21 2107  Weight: 38 kg 35 kg    Examination:  General exam: Appears chronically ill looking, deconditioned, not in any acute distress. Respiratory system: CTA bilaterally, respiratory effort normal, RR 14 Cardiovascular system: S1-S2 heard, regular rate and rhythm, no murmur.   Gastrointestinal system: Abdomen is soft, mildly  distended, nontender, BS +. Central nervous system: Alert and oriented x 2. No focal neurological deficits. Extremities: No edema, no cyanosis, no clubbing. Skin: No rashes, lesions or ulcers Psychiatry: Judgement and insight appear normal. Mood & affect appropriate.     Data Reviewed: I have personally reviewed following labs and imaging studies  CBC: Recent Labs  Lab 08/14/21 0504 08/14/21 2012 08/15/21 0532 08/15/21 1208 08/16/21 0606 08/16/21 1737 08/17/21 0529 08/18/21 0607 08/19/21 0544  WBC 11.4*  --  12.9*  --  11.8*  --   --  12.2* 10.3  HGB 6.4*   < > 7.1*   < > 6.5* 8.9* 8.0* 10.0* 8.3*  HCT 19.4*   < > 21.9*   < > 20.8* 25.7* 24.8* 27.2* 22.8*  MCV 95.6  --  92.8  --  95.0  --   --  98.9 97.0  PLT 471*  --  491*  --  567*  --   --  660* 619*   < > = values in this interval not displayed.   Basic Metabolic Panel: Recent Labs  Lab 08/13/21 0521 08/15/21 0532 08/16/21 0606  NA 137 138 138  K 4.1  3.7 4.0  CL 98 98 98  CO2 30 31 32  GLUCOSE 100* 103* 85  BUN _0 CREATININE 0.48 0.48 0.36*  CALCIUM 7.8* 8.0* 8.1*  MG  --   --  1.8  PHOS  --   --  3.0   GFR: Estimated Creatinine Clearance: 31.5 mL/min (A) (by C-G formula based on SCr of 0.36 mg/dL (L)). Liver Function Tests: Recent Labs  Lab 08/13/21 0521 08/15/21 0532 08/16/21 0606 08/19/21 0544  AST _1 ALT 35 _2 ALKPHOS 78 75 87 95  BILITOT 1.5* 2.1* 2.7* 2.3*  PROT 5.4* 5.5* 5.6* 5.8*  ALBUMIN 2.8* 2.9* 3.0* 3.3*   No results for input(s): LIPASE, AMYLASE in the last 168 hours. No results for input(s): AMMONIA in the last 168 hours.  Coagulation Profile: No results for input(s): INR, PROTIME in the last 168 hours.  Cardiac Enzymes: No results for input(s): CKTOTAL, CKMB, CKMBINDEX, TROPONINI in the last 168 hours. BNP (last 3 results) No results for input(s): PROBNP in the last 8760 hours. HbA1C: No results for input(s): HGBA1C in the last 72 hours. CBG: No  results for input(s): GLUCAP in the last 168 hours.  Lipid Profile: No results for input(s): CHOL, HDL, LDLCALC, TRIG, CHOLHDL, LDLDIRECT in the last 72 hours. Thyroid Function Tests: No results for input(s): TSH, T4TOTAL, FREET4, T3FREE, THYROIDAB in the last 72 hours. Anemia Panel: No results for input(s): VITAMINB12, FOLATE, FERRITIN, TIBC, IRON, RETICCTPCT in the last 72 hours. Sepsis Labs: No results for input(s): PROCALCITON, LATICACIDVEN in the last 168 hours.   Recent Results (from the past 240 hour(s))  Culture, blood (routine x 2)     Status: None   Collection Time: 08/09/21 12:46 PM   Specimen: BLOOD  Result Value Ref Range Status   Specimen Description   Final    BLOOD RIGHT ANTECUBITAL Performed at [facility] [address], [place], [place] [address]    Special Requests   Final    BOTTLES DRAWN AEROBIC AND ANAEROBIC Blood Culture adequate volume Performed at [facility] [address] [place], [place] [address]    Culture   Final    NO GROWTH 5 DAYS Performed at [facility], [place] [address], [place], [place] [address]    Report Status 08/14/2021 FINAL  Final  Resp Panel by RT-PCR (Flu A&B, Covid) Nasopharyngeal Swab     Status: None   Collection Time: 08/09/21 12:47 PM   Specimen: Nasopharyngeal Swab; Nasopharyngeal(NP) swabs in vial transport medium  Result Value Ref Range Status   SARS Coronavirus 2 by RT PCR NEGATIVE NEGATIVE Final    Comment: (NOTE) SARS-CoV-2 target nucleic acids are NOT DETECTED.  The SARS-CoV-2 RNA is generally detectable in upper respiratory specimens during the acute phase of infection. The lowest concentration of SARS-CoV-2 viral copies this assay can detect is 138 copies/mL. A negative result does not preclude SARS-Cov-2 infection and should not be used as the sole basis for treatment or other patient management decisions. A negative result may occur with  improper specimen  collection/handling, submission of specimen other than nasopharyngeal swab, presence of viral mutation(s) within the areas targeted by this assay, and inadequate number of viral copies(<138 copies/mL). A negative result must be combined with clinical observations, patient history, and epidemiological information. The expected result is Negative.  Fact Sheet for Patients:  EntrepreneurPulse.com.au  Fact Sheet for Healthcare Providers:  IncredibleEmployment.be  This test is  no t yet approved or cleared by the [place] and  has been authorized for detection and/or diagnosis of SARS-CoV-2 by FDA under an Emergency Use Authorization (EUA). This EUA will remain  in effect (meaning this test can be used) for the duration of the COVID-19 declaration under Section 564(b)(1) of the Act, 21 U.S.C.section 360bbb-3(b)(1), unless the authorization is terminated  or revoked sooner.       Influenza A by PCR NEGATIVE NEGATIVE Final   Influenza B by PCR NEGATIVE NEGATIVE Final    Comment: (NOTE) The Xpert Xpress SARS-CoV-2/FLU/RSV plus assay is intended as an aid in the diagnosis of influenza from Nasopharyngeal swab specimens and should not be used as a sole basis for treatment. Nasal washings and aspirates are unacceptable for Xpert Xpress SARS-CoV-2/FLU/RSV testing.  Fact Sheet for Patients: EntrepreneurPulse.com.au  Fact Sheet for Healthcare Providers: IncredibleEmployment.be  This test is not yet approved or cleared by the [place] FDA and has been authorized for detection and/or diagnosis of SARS-CoV-2 by FDA under an Emergency Use Authorization (EUA). This EUA will remain in effect (meaning this test can be used) for the duration of the COVID-19 declaration under Section 564(b)(1) of the Act, 21 U.S.C. section 360bbb-3(b)(1), unless the authorization is terminated or revoked.  Performed at [facility], [place] [address], [place], [place] [address]   Culture, blood (routine x 2)     Status: None   Collection Time: 08/09/21 12:51 PM   Specimen: Right Antecubital; Blood  Result Value Ref Range Status   Specimen Description   Final    RIGHT ANTECUBITAL Performed at [facility] [address], [place], [place] [address]    Special Requests   Final    BOTTLES DRAWN AEROBIC AND ANAEROBIC Blood Culture results may not be optimal due to an inadequate volume of blood received in culture bottles Performed at [facility] [address] [place], [place] [address]    Culture   Final    NO GROWTH 5 DAYS Performed at [facility], [place] [address] [place], [place] [address]    Report Status 08/14/2021 FINAL  Final    Radiology Studies: No results found.  Scheduled Meds:  ALPRAZolam  0.25 mg Oral Daily   collagenase   Topical Daily   folic acid  2 mg Oral Daily   hydrocerin   Topical Daily   mouth rinse  15 mL Mouth Rinse BID   vitamin B-12  2,000 mcg Oral Daily   Continuous Infusions:  sodium chloride     lactated ringers       LOS: 9 days    Time spent: 25 mins    [name], MD [facility]   If 7PM-7AM, please contact night-coverage

## 2021-08-19 NOTE — Progress Notes (Signed)
   08/19/21 1000  Mobility  Activity Refused mobility   Pt refused mobility today. Stated she is not feeling well, with general malaise and some hip pain. Will check back with her tomorrow.   [place] Specialist Acute Rehab Services Office: [phone]

## 2021-08-20 LAB — CBC
HCT: 21.9 % — ABNORMAL LOW (ref 36.0–46.0)
Hemoglobin: 7.8 g/dL — ABNORMAL LOW (ref 12.0–15.0)
MCH: 34.8 pg — ABNORMAL HIGH (ref 26.0–34.0)
MCHC: 35.6 g/dL (ref 30.0–36.0)
MCV: 97.8 fL (ref 80.0–100.0)
Platelets: 597 10*3/uL — ABNORMAL HIGH (ref 150–400)
RBC: 2.24 MIL/uL — ABNORMAL LOW (ref 3.87–5.11)
RDW: 20.8 % — ABNORMAL HIGH (ref 11.5–15.5)
WBC: 7.7 10*3/uL (ref 4.0–10.5)
nRBC: 0 % (ref 0.0–0.2)

## 2021-08-20 MED ORDER — HALOPERIDOL LACTATE 5 MG/ML IJ SOLN
1.0000 mg | Freq: Once | INTRAMUSCULAR | Status: AC
Start: 2021-08-20 — End: 2021-08-20
  Administered 2021-08-20: 21:00:00 1 mg via INTRAMUSCULAR
  Filled 2021-08-20: qty 1

## 2021-08-20 MED ORDER — CYANOCOBALAMIN 2000 MCG PO TABS: 2000.0000 ug | ORAL_TABLET | Freq: Every day | ORAL | 2 refills | Status: AC

## 2021-08-20 NOTE — Progress Notes (Signed)
Patient is confused and unable to wear cpap at this time.

## 2021-08-20 NOTE — Progress Notes (Signed)
Physical Therapy Treatment Patient Details Name: LUNDEN STIEBER MRN: [id number] DOB: [birth year] Today's Date: 08/20/2021   History of Present Illness 80 y.o. female with medical history significant of B12 deficiency, bilateral carotid artery gnosis, COPD, diverticulosis/diverticulitis of colon, COPD, former smoker, urolithiasis of the left kidney and ureter, history of idiopathic chronic cold agglutinin disease who was brought to the emergency department via EMS due to decreased mentation, generalized weakness and jaundice. 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    PT Comments    Pt remains pleasantly confused. She was concerned about missing some appointments outside of hospital. She was able to be redirected. She reported some fatigue and declined hallway ambulation on today. However, she was agreeable to getting up to use the bathroom and taking a few steps around the room. Pt continues to exhibit general weakness and impaired balance and safety with functional mobility activities. PT recommendation is for ST SNF rehab.    Recommendations for follow up therapy are one component of a multi-disciplinary discharge planning process, led by the attending physician.  Recommendations may be updated based on patient status, additional functional criteria and insurance authorization.  Follow Up Recommendations  Skilled nursing-short term rehab (<3 hours/day)     Assistance Recommended at Discharge Frequent or constant Supervision/Assistance  Equipment Recommendations  None recommended by PT    Recommendations for Other Services       Precautions / Restrictions Precautions Precautions: Fall Precaution Comments: incontinent Restrictions Weight Bearing Restrictions: No     Mobility  Bed Mobility Overal bed mobility: Needs Assistance Bed Mobility: Supine to Sit;Sit to Supine     Supine to sit: Min assist Sit to supine: Min assist   General bed  mobility comments: light assist/guidance to elevate trunk and to assist LEs back onto bed. increased time. cues provided.    Transfers Overall transfer level: Needs assistance Equipment used: Rolling walker (2 wheels) Transfers: Sit to/from Stand Sit to Stand: Min assist     Step pivot transfers: Min assist     General transfer comment: x 3. cues for safety, technique, posture. assist to rise, steady/balance. increased time.    Ambulation/Gait Ambulation/Gait assistance: Min assist Gait Distance (Feet): 5 Feet (x2) Assistive device: 1 person hand held assist Gait Pattern/deviations: Step-to pattern;Trunk flexed       General Gait Details: 1 HHA provided for short walk across room to/from bsc ~ 5 feet x 2. Unsteady and reaching out for objects in environment to "furniture walk". assist to stabilize required throughout distance. cues provided for safety, technique.   Stairs             Wheelchair Mobility    Modified Rankin (Stroke Patients Only)       Balance Overall balance assessment: Needs assistance         Standing balance support: Single extremity supported;During functional activity Standing balance-Leahy Scale: Poor                              Cognition Arousal/Alertness: Awake/alert Behavior During Therapy: WFL for tasks assessed/performed Overall Cognitive Status: History of cognitive impairments - at baseline Area of Impairment: Orientation;Following commands;Safety/judgement;Problem solving                 Orientation Level: Disoriented to;Place;Time;Situation   Memory: Decreased short-term memory Following Commands: Follows one step commands consistently Safety/Judgement: Decreased awareness of safety   Problem Solving: Requires verbal cues General Comments:  pleasant and cooperative, continues to be oriented only to self. can be redirected when necessary        Exercises      General Comments        Pertinent  Vitals/Pain Pain Assessment: Faces Faces Pain Scale: No hurt    Home Living                          Prior Function            PT Goals (current goals can now be found in the care plan section) Progress towards PT goals: Progressing toward goals    Frequency    Min 2X/week      PT Plan Current plan remains appropriate    Co-evaluation              AM-PAC PT "6 Clicks" Mobility   Outcome Measure  Help needed turning from your back to your side while in a flat bed without using bedrails?: A Little Help needed moving from lying on your back to sitting on the side of a flat bed without using bedrails?: A Little Help needed moving to and from a bed to a chair (including a wheelchair)?: A Little Help needed standing up from a chair using your arms (e.g., wheelchair or bedside chair)?: A Little Help needed to walk in hospital room?: A Little Help needed climbing 3-5 steps with a railing? : A Lot 6 Click Score: 17    End of Session Equipment Utilized During Treatment: Gait belt Activity Tolerance: Patient tolerated treatment well Patient left: in bed;with call bell/phone within reach;with bed alarm set   PT Visit Diagnosis: Muscle weakness (generalized) (M62.81);Unsteadiness on feet (R26.81)     Time: 1426-1440 PT Time Calculation (min) (ACUTE ONLY): 14 min  Charges:  $Gait Training: 8-22 mins                         Doreatha Massed, PT Acute Rehabilitation  Office: [phone] Pager: [phone]

## 2021-08-20 NOTE — TOC Progression Note (Addendum)
Transition of Care [facility]) - Progression Note    Patient Details  Name: [name] MRN: [id number] Date of Birth: 1941-03-03  [facility] CM/SW Contact  Lisa Milian, Marjie Skiff, RN Phone Number: 08/20/2021, 2:27 PM  Clinical Narrative:    Spoke with sister [name] to provide additional SNF bed offers. [name] chose [facility]. [facility] will have a bed available for pt tomorrow. SNF auth will need to be started with [facility] once new PT note has been documented. TOC will continue to follow.  1527 Addendum: SNF auth started on [facility] portal.  Expected Discharge Plan: [place] Barriers to Discharge: Continued Medical Work up, SNF Pending bed offer  Expected Discharge Plan and Services Expected Discharge Plan: [place] In-house Referral: Clinical Social Work   Post Acute Care Choice: [place] Living arrangements for the past 2 months: Single Family Home                 DME Arranged: N/A DME Agency: NA        Social Determinants of Health (SDOH) Interventions    Readmission Risk Interventions Readmission Risk Prevention Plan 08/20/2021  Transportation Screening Complete  PCP or Specialist Appt within 5-7 Days Complete  Home Care Screening Complete  Medication Review (RN CM) Complete  Some recent data might be hidden

## 2021-08-20 NOTE — Discharge Instructions (Signed)
Advised to follow-up with primary care physician in 1 week. Advised to follow-up with hematologist as scheduled.   Patient discharged to skilled nursing facility for rehab.

## 2021-08-20 NOTE — Progress Notes (Signed)
PROGRESS NOTE    [name]  PFX:[id number] DOB: June 21, 1941 DOA: 08/09/2021 PCP: [name], MD    Brief Narrative:  This 80 yrs old female with PMH  significant of B12 deficiency, bilateral carotid artery stenosis,COPD, diverticulosis/diverticulitis of colon, COPD, former smoker, urolithiasis of the left kidney and ureter, history of idiopathic chronic cold agglutinin disease who was brought to the emergency department via EMS due to decreased mentation, generalized weakness and jaundice.  EMS found the patient to be hypoxic 88% on room air and placed her on supplemental oxygen. Patient admitted for sepsis and acute hypoxic respiratory failure secondary to pneumonia.  She has completed antibiotics for pneumonia.  She remains hypoxic , requiring  2 L of supplemental oxygen.  She is receiving multiple units of blood transfusion due to hemolytic anemia because of Cold antibody.  Heme-onc is consulted.  PT recommended SNF, TOC working on SNF placement.  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 to pneumonia: Patient presented hypoxic on arrival, 88% on room air, now requiring 2 L of supplemental oxygen. Continue supplemental oxygen and wean as tolerated. She has completed antibiotics for pneumonia.  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 Hgb has trended down to 6.8 on 11/22, has received 1 unit warmed blood tx.  Continue cyanocobalamin and folate. Hematology consulted 11/17.  Appreciated input.   Recommended for CBC and all hemolytic labs to be collected in  pre warmed tubes at 37 degrees and run at 37 degrees for accurate results. All blood and fluids to be given through warmer Cont to transfuse for hgb <7 Consideration for Rituxan infusion which could be done as outpatient. Her hemoglobin again dropped to 6.8. on 11/24  Has received  1 unit of PRBC. Reached out to heme-onc to see if Rituxan infusion can be given inpatient. Hematologist responded Rituxan infusion would be given outpatient. Hemoglobin has back to normal. Hb 10.0 > 8.3 >7.8   Sepsis could be secondary to PNA: Patient met sepsis criteria on arrival.  Tachycardia, tachypnea, fever, leukocytosis, lactic acidosis. Chest x-ray showed increased opacity at left lung base may reflect combination of pleural effusion and consolidation Patient has completed 5 days of Zithromax and Rocephin. Patient has developed posttransfusion hypoxia which improved with doses of IV Lasix Sepsis physiology has resolved.  Continue to wean oxygen as tolerated.  Hypokalemia : Replaced .  Continue to monitor    COPD: Continue Bronchodilators as needed. Stable. Wean O2 as tolerated, goal is room air.   Elevated liver enzymes: Suspect in the setting of hemolytic anemia. Labs reviewed, liver profile is improving, bilirubin up to 2.7>2.3 Repeat cmp in AM   Aortic atherosclerosis Not on medical therapy. Will defer starting statin at the moment. Recommend follow-up with PCP as an outpatient.   Metabolic encephalopathy with underlying dementia: Suspect could be related to sepsis. Family member reports steady and gradual decline prior to this hospitalization with more notable rapid decline leading to this admission. Mentation seems to be near baseline. Continue Haldol as needed.   DVT prophylaxis: SCDs Code Status: DNR Family Communication: No family at bed side. Disposition  Plan:   Status is: Inpatient  Remains inpatient appropriate because: Severity of Illness.  Hemolytic anemia requiring blood  transfusions. PT recommended SNF, TOC working on SNF placement.  Patient is medically clear : Yes  Consultants:  PCCM Oncology  Procedures: None Antimicrobials:   Anti-infectives (From admission, onward)    Start     Dose/Rate Route Frequency Ordered Stop   08/09/21 2000  cefTRIAXone (ROCEPHIN) 2 g in sodium chloride 0.9 % 100 mL IVPB  Status:  Discontinued        2 g 200 mL/hr over 30 Minutes Intravenous Every 24 hours 08/09/21 1844 08/09/21 1931   08/09/21 2000  azithromycin (ZITHROMAX) 500 mg in sodium chloride 0.9 % 250 mL IVPB        500 mg 250 mL/hr over 60 Minutes Intravenous Every 24 hours 08/09/21 1844 08/13/21 2259   08/09/21 2000  cefTRIAXone (ROCEPHIN) 1 g in sodium chloride 0.9 % 100 mL IVPB        1 g 200 mL/hr over 30 Minutes Intravenous Every 24 hours 08/09/21 1931 08/13/21 2159        Subjective: Patient was seen and examined at bedside.  Overnight events noted.   Patient is sitting comfortably in the recliner.  She still remains on 2.5 L of supplemental oxygen to maintain saturation above 94%. Her hemoglobin slightly down to 7.8 , remains improved. She denies any other concerns.   Objective: Vitals:   08/18/21 2111 08/19/21 0646 08/19/21 1416 08/20/21 0556  BP: (!) 124/43 (!) 131/47 (!) 112/52 (!) 120/45  Pulse: 73 76 66 63  Resp: '14 14 16 14  ' Temp: 97.7 F (36.5 C) (!) 97.4 F (36.3 C) 97.8 F (36.6 C) (!) 97.4 F (36.3 C)  TempSrc: Oral Oral Oral Oral  SpO2: 97% 95% 100% 100%  Weight:      Height:        Intake/Output Summary (Last 24 hours) at 08/20/2021 1131 Last data filed at 08/20/2021 0831 Gross per 24 hour  Intake 600 ml  Output 600 ml  Net 0 ml   Filed Weights   08/09/21 1244 08/09/21 2107  Weight: 38 kg 35 kg    Examination:  General exam: Chronically Ill looking, Thin built , deconditioned, not in any acute distress. Respiratory system: CTA bilaterally, respiratory effort normal, RR 16 Cardiovascular system: S1-S2 heard,  regular rate and rhythm, no murmur.   Gastrointestinal system: Abdomen is soft, mildly distended, nontender, BS +. Central nervous system: Alert and oriented x 2. No focal neurological deficits. Extremities: No edema, no cyanosis, no clubbing. Skin: No rashes, lesions or ulcers Psychiatry:. Mood & affect appropriate.     Data Reviewed: I have personally reviewed following labs and imaging studies  CBC: Recent Labs  Lab 08/15/21 0532 08/15/21 1208 08/16/21 0606 08/16/21 1737 08/17/21 0529 08/18/21 0607 08/19/21 0544 08/20/21 0531  WBC 12.9*  --  11.8*  --   --  12.2* 10.3 7.7  HGB 7.1*   < > 6.5* 8.9* 8.0* 10.0* 8.3* 7.8*  HCT 21.9*   < > 20.8* 25.7* 24.8* 27.2* 22.8* 21.9*  MCV 92.8  --  95.0  --   --  98.9 97.0 97.8  PLT 491*  --  567*  --   --  660* 619* 597*   < > = values in this interval not displayed.   Basic Metabolic Panel: Recent Labs  Lab 08/15/21 0532 08/16/21 0606  NA 138 138  K 3.7 4.0  CL 98 98  CO2 31 32  GLUCOSE 103* 85  BUN 15 13  CREATININE 0.48 0.36*  CALCIUM 8.0* 8.1*  MG  --  1.8  PHOS  --  3.0   GFR: Estimated Creatinine Clearance: 31.5 mL/min (A) (by C-G formula based on SCr of 0.36 mg/dL (L)). Liver Function Tests: Recent Labs  Lab 08/15/21 0532 08/16/21 0606 08/19/21 0544  AST '16 17 19  ' ALT '21 20 17  ' ALKPHOS 75 87 95  BILITOT 2.1* 2.7* 2.3*  PROT 5.5* 5.6* 5.8*  ALBUMIN 2.9* 3.0* 3.3*   No results for input(s): LIPASE, AMYLASE in the last 168 hours. No results for input(s): AMMONIA in the last 168 hours.  Coagulation Profile: No results for input(s): INR, PROTIME in the last 168 hours.  Cardiac Enzymes: No results for input(s): CKTOTAL, CKMB, CKMBINDEX, TROPONINI in the last 168 hours. BNP (last 3 results) No results for input(s): PROBNP in the last 8760 hours. HbA1C: No results for input(s): HGBA1C in the last 72 hours. CBG: No results for input(s): GLUCAP in the last 168 hours.  Lipid Profile: No results for  input(s): CHOL, HDL, LDLCALC, TRIG, CHOLHDL, LDLDIRECT in the last 72 hours. Thyroid Function Tests: No results for input(s): TSH, T4TOTAL, FREET4, T3FREE, THYROIDAB in the last 72 hours. Anemia Panel: No results for input(s): VITAMINB12, FOLATE, FERRITIN, TIBC, IRON, RETICCTPCT in the last 72 hours. Sepsis Labs: No results for input(s): PROCALCITON, LATICACIDVEN in the last 168 hours.   No results found for this or any previous visit (from the past 240 hour(s)).   Radiology Studies: No results found.  Scheduled Meds:  ALPRAZolam  0.25 mg Oral Daily   collagenase   Topical Daily   folic acid  2 mg Oral Daily   hydrocerin   Topical Daily   mouth rinse  15 mL Mouth Rinse BID   vitamin B-12  2,000 mcg Oral Daily   Continuous Infusions:  sodium chloride     lactated ringers       LOS: 10 days    Time spent: 25 mins    [name], MD [facility]   If 7PM-7AM, please contact night-coverage

## 2021-08-20 NOTE — Care Management Important Message (Signed)
Important Message  Patient Details IM Letter placed in Patients room. Name: BRITLEE SKOLNIK MRN: [id number] Date of Birth: March 05, 1941   Medicare Important Message Given:  Yes     Kerin Salen 08/20/2021, 10:57 AM

## 2021-08-20 NOTE — Progress Notes (Signed)
   08/20/21 1200  Mobility  Activity Refused mobility   Pt refused mobility today. Will check back tomorrow.   [facility] Specialist Acute Rehab Services Office: [phone]

## 2021-08-21 LAB — CBC
HCT: 25.7 % — ABNORMAL LOW (ref 36.0–46.0)
Hemoglobin: 8.2 g/dL — ABNORMAL LOW (ref 12.0–15.0)
MCH: 29.5 pg (ref 26.0–34.0)
MCHC: 31.9 g/dL (ref 30.0–36.0)
MCV: 92.4 fL (ref 80.0–100.0)
Platelets: 628 10*3/uL — ABNORMAL HIGH (ref 150–400)
RBC: 2.78 MIL/uL — ABNORMAL LOW (ref 3.87–5.11)
RDW: 17.7 % — ABNORMAL HIGH (ref 11.5–15.5)
WBC: 9.5 10*3/uL (ref 4.0–10.5)
nRBC: 0 % (ref 0.0–0.2)

## 2021-08-21 LAB — RESP PANEL BY RT-PCR (FLU A&B, COVID) ARPGX2
Influenza A by PCR: NEGATIVE
Influenza B by PCR: NEGATIVE
SARS Coronavirus 2 by RT PCR: NEGATIVE

## 2021-08-21 NOTE — TOC Transition Note (Signed)
Transition of Care [facility]) - CM/SW Discharge Note   Patient Details  Name: [name] MRN: [id number] Date of Birth: 20-Jul-1941  Transition of Care [facility]) CM/SW Contact:  Lynnell Catalan, RN Phone Number: 08/21/2021, 11:26 AM   Clinical Narrative:    Insurance auth received for SNF from [facility] [id number]. Next review date of 12/1. Daughter [name] contacted to inform of transfer today. Pt to transport to [facility] via [place]. She will go to room 34. RN to call report to [phone].   Final next level of care: Skilled Nursing Facility Barriers to Discharge: Continued Medical Work up, SNF Pending bed offer   Patient Goals and CMS Choice Patient states their goals for this hospitalization and ongoing recovery are:: Go to rehab CMS Medicare.gov Compare Post Acute Care list provided to:: Patient Represenative (must comment) [name] (sister)) Choice offered to / list presented to : Sibling  Discharge Placement              Patient chooses bed at: [facility] Patient to be transferred to facility by: [facility] Name of family member notified: [name] Patient and family notified of of transfer: 08/21/21  Discharge Plan and Services In-house Referral: Clinical Social Work   Post Acute Care Choice: [place]          DME Arranged: N/A DME Agency: NA                  Social Determinants of Health ([place]) Interventions     Readmission Risk Interventions Readmission Risk Prevention Plan 08/20/2021  Transportation Screening Complete  PCP or Specialist Appt within 5-7 Days Complete  Home Care Screening Complete  Medication Review (RN CM) Complete  Some recent data might be hidden

## 2021-08-21 NOTE — Progress Notes (Signed)
Report given to [facility]

## 2021-08-21 NOTE — Discharge Summary (Signed)
Physician Discharge Summary  [name] SWH:[id number] DOB: 1941-01-18 DOA: 08/09/2021  PCP: [name], MD  Admit date: 08/09/2021  Discharge date: 08/21/2021  Admitted From: Home.  Disposition: SNF  Recommendations for Outpatient Follow-up:  Follow up with PCP in 1-2 weeks Please obtain BMP/CBC in one week Follow-up with hematologist as an outpatient.  Home Health: None Equipment/Devices: Home oxygen @ 2l/m  Discharge Condition: Stable CODE STATUS:DNR Diet recommendation: Heart Healthy   Brief Summary / Hospital course: This 80 yrs old female with PMH  significant of  Vitamin B12 deficiency, bilateral carotid artery stenosis, COPD, diverticulosis/diverticulitis of colon, COPD, former smoker, urolithiasis of the left kidney and ureter, history of idiopathic chronic cold agglutinin disease who was brought to the emergency department via EMS due to decreased mentation, generalized weakness and jaundice.  EMS found the patient to be hypoxic 88% on room air and placed her on supplemental oxygen. Patient was admitted for sepsis and acute hypoxic respiratory failure secondary to pneumonia.  She has completed antibiotics for pneumonia.  She remains hypoxic , requiring  2 L of supplemental oxygen.  She has received multiple units of blood transfusion due to hemolytic anemia because of Cold antibody.  Heme-onc is consulted.  Patient has received so far 5 units of PRBCs.  Hemoglobin has finally remained stable.  Patient will follow up with oncology outpatient.  Patient is overall stable but remains severely deconditioned requiring physical therapy and rehab.  Patient is being discharged to SNF for rehab.  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 to pneumonia: Patient presented hypoxic on arrival, 88% on room air, now requiring 2 L of supplemental oxygen. Continue supplemental oxygen and wean as tolerated. She has completed antibiotics for pneumonia.   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 Continue cyanocobalamin and folate. Hematology consulted 11/17.  Appreciated input.   Recommended for CBC and all hemolytic labs to be collected in pre warmed tubes at 37 degrees and run at 37 degrees for accurate results. All blood and fluids to be given through warmer.   Cont to transfuse for hgb <7 Consideration for Rituxan infusion which could be done as outpatient. Her hemoglobin again dropped to 6.8. on 11/24  Has received  1 unit of PRBC. Reached out to heme-onc to see if Rituxan infusion can be given inpatient. Hematologist responded Rituxan infusion would be given outpatient. Hemoglobin has back to normal. Hb 10.0 > 8.3 >7.8 >8.4   Sepsis could be secondary to PNA: Patient met sepsis criteria on arrival.  Tachycardia, tachypnea, fever, leukocytosis, lactic acidosis. Chest x-ray showed increased opacity at left lung base may reflect combination of pleural effusion and consolidation Patient has completed 5 days of Zithromax and Rocephin. Patient has developed posttransfusion hypoxia which improved with doses of IV Lasix. Sepsis physiology has resolved.  Continue to wean oxygen as tolerated.  Hypokalemia : Replaced .  Resolved.    COPD: Continue Bronchodilators as needed. Stable. Wean O2 as tolerated, goal is room air.   Elevated liver enzymes: Suspect in the setting of hemolytic anemia. Labs reviewed, liver  profile is improving, bilirubin up to 2.7>2.3 Repeat CMP in 1 week.   Aortic atherosclerosis Not  on medical therapy. Will defer starting statin at the moment. Recommend follow-up with PCP as an outpatient.   Metabolic encephalopathy with underlying dementia: Suspect could be related to sepsis. Family member reports steady and gradual decline prior to this hospitalization with more notable rapid decline leading to this admission. Mentation seems to be near baseline. Continue Haldol as needed.    Discharge Instructions  Discharge Instructions     Call MD for:  difficulty breathing, headache or visual disturbances   Complete by: As directed    Call MD for:  persistant dizziness or light-headedness   Complete by: As directed    Call MD for:  persistant nausea and vomiting   Complete by: As directed    Call MD for:  temperature >100.4   Complete by: As directed    Diet - low sodium heart healthy   Complete by: As directed    Diet - low sodium heart healthy   Complete by: As directed    Diet Carb Modified   Complete by: As directed    Discharge instructions   Complete by: As directed    Advised to follow-up with primary care physician in 1 week. Advised to follow-up with hematologist as scheduled.   Patient discharged to skilled nursing facility for rehab.   Discharge wound care:   Complete by: As directed    Follow-up wound care nursing home.   Discharge wound care:   Complete by: As directed    Follow-up wound care at nursing home.   Increase activity slowly   Complete by: As directed    Increase activity slowly   Complete by: As directed       Allergies as of 08/21/2021       Reactions   Other Other (See Comments)   "Most antibiotics give yeast infection"   Shellfish Allergy Hives        Medication List     STOP taking these medications    docusate sodium 100 MG capsule Commonly known as: COLACE   ergocalciferol 1.25 MG (50000 UT) capsule Commonly known as: VITAMIN D2   folic acid 1 MG tablet Commonly known as: FOLVITE   multivitamin with  minerals Tabs tablet   senna-docusate 8.6-50 MG tablet Commonly known as: Senokot-S   traMADol 50 MG tablet Commonly known as: ULTRAM   Vitamin B-12 1000 MCG Subl Replaced by: cyanocobalamin 2000 MCG tablet       TAKE these medications    acetaminophen 500 MG tablet Commonly known as: TYLENOL Take 500 mg by mouth every 6 (six) hours as needed for moderate pain. What changed: Another medication with the same name was removed. Continue taking this medication, and follow the directions you see here.   ALPRAZolam 0.25 MG tablet Commonly known as: XANAX Take 0.25 mg by mouth daily.   cyanocobalamin 2000 MCG tablet Take 1 tablet (2,000 mcg total) by mouth daily. Replaces: Vitamin B-12 1000 MCG Subl               Discharge Care Instructions  (From admission, onward)           Start     Ordered   08/21/21 0000  Discharge wound care:       Comments: Follow-up wound care at nursing home.   08/21/21 1032   08/20/21 0000  Discharge wound care:       Comments: Follow-up wound  care nursing home.   08/20/21 1518            Follow-up Information     [name], MD Follow up in 1 week(s).   Specialty: Internal Medicine Contact information: [place], [place] [address] [phone]         Brunetta Genera, MD Follow up in 1 week(s).   Specialties: Hematology, Oncology Contact information: [place] [address] [phone]                Allergies  Allergen Reactions   Other Other (See Comments)    "Most antibiotics give yeast infection"   Shellfish Allergy Hives    Consultations: oncology   Procedures/Studies: CT ABDOMEN PELVIS W CONTRAST  Result Date: 08/09/2021 CLINICAL DATA:  Abdominal pain, jaundice EXAM: CT ABDOMEN AND PELVIS WITH CONTRAST TECHNIQUE: Multidetector CT imaging of the abdomen and pelvis was performed using the standard protocol following bolus administration of  intravenous contrast. CONTRAST:  59m OMNIPAQUE IOHEXOL 350 MG/ML SOLN COMPARISON:  CT abdomen/pelvis 05/28/2018, CT abdomen/pelvis 05/28/2018 FINDINGS: Lower chest: There are bilateral pleural effusions with adjacent atelectasis, left slightly larger than right. The imaged heart is unremarkable. Hepatobiliary: There are no focal liver lesions. The liver surface contour is mildly nodular. There is cholelithiasis without evidence of acute cholecystitis. There is no intra or extrahepatic biliary ductal dilatation. Pancreas: The pancreas is atrophic. There are no focal lesions or contour abnormalities. There is no main pancreatic ductal dilatation or peripancreatic inflammatory change. Spleen: Unremarkable. Adrenals/Urinary Tract: The adrenals are unremarkable. Slight contour abnormality in the left interpolar region is unchanged going back to 2019. A hypodense lesion in the right lower pole is too small to characterize but most likely reflects a small cyst. There is a 5 mm nonobstructing left renal stone. There is no hydronephrosis or hydroureter. The bladder is decompressed but grossly unremarkable. Stomach/Bowel: The stomach is unremarkable. There is no evidence of bowel obstruction. There is sigmoid diverticulosis without evidence of acute diverticulitis. There is no evidence of abnormal bowel wall thickening or inflammatory change. Vascular/Lymphatic: There is extensive vascular calcification throughout the nonaneurysmal abdominal aorta. The major branch vessels are patent. The main portal and splenic veins are patent. There is no abdominal or pelvic lymphadenopathy. Reproductive: The uterus is surgically absent. There is a 1.5 cm left adnexal cyst, present since at least 2019. There is no right adnexal mass. Other: There is trace free fluid in the pelvis in the presacral space. There is no upper abdominal ascites. Musculoskeletal: The patient is status post kyphoplasty at T12. There is compression deformity of  the T11, L1, L3, L4, and L5 vertebral bodies, overall progressed since 2019, particularly at L1 and L3. There is no evidence of acute fracture or aggressive osseous lesion. Postsurgical changes are partially imaged in the left femur. IMPRESSION: 1. Nodular surface contour of the liver suspicious for cirrhosis. 2. Cholelithiasis without evidence of acute cholecystitis. No biliary ductal dilatation. 3. 5 mm nonobstructing left renal stone. 4. Bilateral pleural effusions. 5. Trace free fluid in the presacral space, nonspecific. 6. Diverticulosis without evidence of acute diverticulitis. 7. Multiple lumbar compression deformities have overall progressed since 2019, particularly at L1 and L3. Post kyphoplasty changes are noted at T12. Aortic Atherosclerosis (ICD10-I70.0). Electronically Signed   By: PValetta MoleM.D.   On: 08/09/2021 14:35   DG CHEST PORT 1 VIEW  Result Date: 08/10/2021 CLINICAL DATA:  Hypoxia. EXAM: PORTABLE CHEST 1 VIEW COMPARISON:  Chest x-ray 08/09/2021.  FINDINGS: The heart is enlarged, unchanged. Diffuse central interstitial prominence and patchy perihilar opacities have increased. Small bilateral pleural effusions are present, increasing on the right. There is no evidence for pneumothorax. No acute fractures are seen. IMPRESSION: 1. Increasing moderate pulmonary edema. 2. Small pleural effusions, increasing on the right. 3. Stable cardiomegaly. Electronically Signed   By: Ronney Asters M.D.   On: 08/10/2021 15:13   DG Chest Portable 1 View  Result Date: 08/09/2021 CLINICAL DATA:  Shortness of breath EXAM: PORTABLE CHEST 1 VIEW COMPARISON:  01/10/2021 FINDINGS: Patient is slightly rotated. Mild cardiomegaly. Increased opacity at the left lung base may reflect a combination of pleural effusion and consolidation. Right lung field is clear. No pneumothorax. Bony structures appear demineralized. IMPRESSION: Increased opacity at the left lung base may reflect a combination of pleural effusion  and consolidation. Electronically Signed   By: Davina Poke D.O.   On: 08/09/2021 14:09      Subjective: Patient was seen and examined at bedside.  Overnight events noted.  Patient reports feeling much improved.   Patient seems at her baseline mental status but she is severely deconditioned requiring physical therapy and rehab. Patient is being discharged to skilled nursing facility for rehab.  Discharge Exam: Vitals:   08/20/21 2121 08/21/21 0701  BP: (!) 134/49 (!) 140/48  Pulse: 72 72  Resp: 17 17  Temp: 98.4 F (36.9 C) 98.4 F (36.9 C)  SpO2: 97% 90%   Vitals:   08/20/21 0556 08/20/21 1320 08/20/21 2121 08/21/21 0701  BP: (!) 120/45 (!) 125/49 (!) 134/49 (!) 140/48  Pulse: 63 66 72 72  Resp: _0 Temp: (!) 97.4 F (36.3 C) 98.4 F (36.9 C) 98.4 F (36.9 C) 98.4 F (36.9 C)  TempSrc: Oral  Oral Oral  SpO2: 100% 91% 97% 90%  Weight:      Height:        General: Pt is alert, awake, not in acute distress Cardiovascular: RRR, S1/S2 +, no rubs, no gallops Respiratory: CTA bilaterally, no wheezing, no rhonchi Abdominal: Soft, NT, ND, bowel sounds + Extremities: no edema, no cyanosis    The results of significant diagnostics from this hospitalization (including imaging, microbiology, ancillary and laboratory) are listed below for reference.     Microbiology: Recent Results (from the past 240 hour(s))  Resp Panel by RT-PCR (Flu A&B, Covid) Nasopharyngeal Swab     Status: None   Collection Time: 08/21/21  9:19 AM   Specimen: Nasopharyngeal Swab; Nasopharyngeal(NP) swabs in vial transport medium  Result Value Ref Range Status   SARS Coronavirus 2 by RT PCR NEGATIVE NEGATIVE Final    Comment: (NOTE) SARS-CoV-2 target nucleic acids are NOT DETECTED.  The SARS-CoV-2 RNA is generally detectable in upper respiratory specimens during the acute phase of infection. The lowest concentration of SARS-CoV-2 viral copies this assay can detect is 138 copies/mL. A  negative result does not preclude SARS-Cov-2 infection and should not be used as the sole basis for treatment or other patient management decisions. A negative result may occur with  improper specimen collection/handling, submission of specimen other than nasopharyngeal swab, presence of viral mutation(s) within the areas targeted by this assay, and inadequate number of viral copies(<138 copies/mL). A negative result must be combined with clinical observations, patient history, and epidemiological information. The expected result is Negative.  Fact Sheet for Patients:  EntrepreneurPulse.com.au  Fact Sheet for Healthcare Providers:  IncredibleEmployment.be  This test is no t yet approved or cleared by  the [employer] and  has been authorized for detection and/or diagnosis of SARS-CoV-2 by FDA under an Emergency Use Authorization (EUA). This EUA will remain  in effect (meaning this test can be used) for the duration of the COVID-19 declaration under Section 564(b)(1) of the Act, 21 U.S.C.section 360bbb-3(b)(1), unless the authorization is terminated  or revoked sooner.       Influenza A by PCR NEGATIVE NEGATIVE Final   Influenza B by PCR NEGATIVE NEGATIVE Final    Comment: (NOTE) The Xpert Xpress SARS-CoV-2/FLU/RSV plus assay is intended as an aid in the diagnosis of influenza from Nasopharyngeal swab specimens and should not be used as a sole basis for treatment. Nasal washings and aspirates are unacceptable for Xpert Xpress SARS-CoV-2/FLU/RSV testing.  Fact Sheet for Patients: EntrepreneurPulse.com.au  Fact Sheet for Healthcare Providers: IncredibleEmployment.be  This test is not yet approved or cleared by the [place] FDA and has been authorized for detection and/or diagnosis of SARS-CoV-2 by FDA under an Emergency Use Authorization (EUA). This EUA will remain in effect (meaning this test can  be used) for the duration of the COVID-19 declaration under Section 564(b)(1) of the Act, 21 U.S.C. section 360bbb-3(b)(1), unless the authorization is terminated or revoked.  Performed at [facility], [place] [address], [place], [place] [address]      Labs: BNP (last 3 results) No results for input(s): BNP in the last 8760 hours. Basic Metabolic Panel: Recent Labs  Lab 08/15/21 0532 08/16/21 0606  NA 138 138  K 3.7 4.0  CL 98 98  CO2 31 32  GLUCOSE 103* 85  BUN 15 13  CREATININE 0.48 0.36*  CALCIUM 8.0* 8.1*  MG  --  1.8  PHOS  --  3.0   Liver Function Tests: Recent Labs  Lab 08/15/21 0532 08/16/21 0606 08/19/21 0544  AST _0 ALT _1 ALKPHOS 75 87 95  BILITOT 2.1* 2.7* 2.3*  PROT 5.5* 5.6* 5.8*  ALBUMIN 2.9* 3.0* 3.3*   No results for input(s): LIPASE, AMYLASE in the last 168 hours. No results for input(s): AMMONIA in the last 168 hours. CBC: Recent Labs  Lab 08/16/21 0606 08/16/21 1737 08/17/21 0529 08/18/21 0607 08/19/21 0544 08/20/21 0531 08/21/21 0531  WBC 11.8*  --   --  12.2* 10.3 7.7 9.5  HGB 6.5*   < > 8.0* 10.0* 8.3* 7.8* 8.2*  HCT 20.8*   < > 24.8* 27.2* 22.8* 21.9* 25.7*  MCV 95.0  --   --  98.9 97.0 97.8 92.4  PLT 567*  --   --  660* 619* 597* 628*   < > = values in this interval not displayed.   Cardiac Enzymes: No results for input(s): CKTOTAL, CKMB, CKMBINDEX, TROPONINI in the last 168 hours. BNP: Invalid input(s): POCBNP CBG: No results for input(s): GLUCAP in the last 168 hours. D-Dimer No results for input(s): DDIMER in the last 72 hours. Hgb A1c No results for input(s): HGBA1C in the last 72 hours. Lipid Profile No results for input(s): CHOL, HDL, LDLCALC, TRIG, CHOLHDL, LDLDIRECT in the last 72 hours. Thyroid function studies No results for input(s): TSH, T4TOTAL, T3FREE, THYROIDAB in the last 72 hours.  Invalid input(s): FREET3 Anemia work up No results for input(s): VITAMINB12, FOLATE,  FERRITIN, TIBC, IRON, RETICCTPCT in the last 72 hours. Urinalysis    Component Value Date/Time   COLORURINE YELLOW 08/09/2021 [place] 08/09/2021 1246   LABSPEC >1.046 (H) 08/09/2021 1246  PHURINE 5.0 08/09/2021 1246   GLUCOSEU NEGATIVE 08/09/2021 1246   HGBUR SMALL (A) 08/09/2021 1246   BILIRUBINUR NEGATIVE 08/09/2021 1246   KETONESUR 5 (A) 08/09/2021 1246   PROTEINUR NEGATIVE 08/09/2021 1246   NITRITE NEGATIVE 08/09/2021 1246   LEUKOCYTESUR NEGATIVE 08/09/2021 1246   Sepsis Labs Invalid input(s): PROCALCITONIN,  WBC,  LACTICIDVEN Microbiology Recent Results (from the past 240 hour(s))  Resp Panel by RT-PCR (Flu A&B, Covid) Nasopharyngeal Swab     Status: None   Collection Time: 08/21/21  9:19 AM   Specimen: Nasopharyngeal Swab; Nasopharyngeal(NP) swabs in vial transport medium  Result Value Ref Range Status   SARS Coronavirus 2 by RT PCR NEGATIVE NEGATIVE Final    Comment: (NOTE) SARS-CoV-2 target nucleic acids are NOT DETECTED.  The SARS-CoV-2 RNA is generally detectable in upper respiratory specimens during the acute phase of infection. The lowest concentration of SARS-CoV-2 viral copies this assay can detect is 138 copies/mL. A negative result does not preclude SARS-Cov-2 infection and should not be used as the sole basis for treatment or other patient management decisions. A negative result may occur with  improper specimen collection/handling, submission of specimen other than nasopharyngeal swab, presence of viral mutation(s) within the areas targeted by this assay, and inadequate number of viral copies(<138 copies/mL). A negative result must be combined with clinical observations, patient history, and epidemiological information. The expected result is Negative.  Fact Sheet for Patients:  EntrepreneurPulse.com.au  Fact Sheet for Healthcare Providers:  IncredibleEmployment.be  This test is no t yet approved or  cleared by the [place] FDA and  has been authorized for detection and/or diagnosis of SARS-CoV-2 by FDA under an Emergency Use Authorization (EUA). This EUA will remain  in effect (meaning this test can be used) for the duration of the COVID-19 declaration under Section 564(b)(1) of the Act, 21 U.S.C.section 360bbb-3(b)(1), unless the authorization is terminated  or revoked sooner.       Influenza A by PCR NEGATIVE NEGATIVE Final   Influenza B by PCR NEGATIVE NEGATIVE Final    Comment: (NOTE) The Xpert Xpress SARS-CoV-2/FLU/RSV plus assay is intended as an aid in the diagnosis of influenza from Nasopharyngeal swab specimens and should not be used as a sole basis for treatment. Nasal washings and aspirates are unacceptable for Xpert Xpress SARS-CoV-2/FLU/RSV testing.  Fact Sheet for Patients: EntrepreneurPulse.com.au  Fact Sheet for Healthcare Providers: IncredibleEmployment.be  This test is not yet approved or cleared by the [place] FDA and has been authorized for detection and/or diagnosis of SARS-CoV-2 by FDA under an Emergency Use Authorization (EUA). This EUA will remain in effect (meaning this test can be used) for the duration of the COVID-19 declaration under Section 564(b)(1) of the Act, 21 U.S.C. section 360bbb-3(b)(1), unless the authorization is terminated or revoked.  Performed at [facility], [place] [address], [place], [place] [address]      Time coordinating discharge: Over 30 minutes  SIGNED:   [name], MD  [facility] 08/21/2021, 11:11 AM Pager   If 7PM-7AM, please contact night-coverage

## 2021-08-29 ENCOUNTER — Other Ambulatory Visit: Payer: Self-pay

## 2021-08-29 DIAGNOSIS — D591 Autoimmune hemolytic anemia, unspecified: Secondary | ICD-10-CM

## 2021-08-30 ENCOUNTER — Inpatient Hospital Stay: Payer: Medicare Other

## 2021-08-30 ENCOUNTER — Other Ambulatory Visit: Payer: Self-pay

## 2021-08-30 ENCOUNTER — Inpatient Hospital Stay: Payer: Medicare Other | Attending: Hematology | Admitting: Hematology

## 2021-08-30 VITALS — BP 135/42 | HR 67 | Temp 97.2°F | Resp 18 | Wt 85.7 lb

## 2021-08-30 DIAGNOSIS — I700 Atherosclerosis of aorta: Secondary | ICD-10-CM | POA: Diagnosis not present

## 2021-08-30 DIAGNOSIS — G4700 Insomnia, unspecified: Secondary | ICD-10-CM | POA: Insufficient documentation

## 2021-08-30 DIAGNOSIS — Z79899 Other long term (current) drug therapy: Secondary | ICD-10-CM | POA: Diagnosis not present

## 2021-08-30 DIAGNOSIS — F0390 Unspecified dementia without behavioral disturbance: Secondary | ICD-10-CM | POA: Diagnosis not present

## 2021-08-30 DIAGNOSIS — J90 Pleural effusion, not elsewhere classified: Secondary | ICD-10-CM | POA: Insufficient documentation

## 2021-08-30 DIAGNOSIS — J449 Chronic obstructive pulmonary disease, unspecified: Secondary | ICD-10-CM | POA: Diagnosis not present

## 2021-08-30 DIAGNOSIS — D591 Autoimmune hemolytic anemia, unspecified: Secondary | ICD-10-CM | POA: Diagnosis not present

## 2021-08-30 DIAGNOSIS — E538 Deficiency of other specified B group vitamins: Secondary | ICD-10-CM | POA: Insufficient documentation

## 2021-08-30 DIAGNOSIS — Z87891 Personal history of nicotine dependence: Secondary | ICD-10-CM | POA: Insufficient documentation

## 2021-08-30 DIAGNOSIS — D5912 Cold autoimmune hemolytic anemia: Secondary | ICD-10-CM | POA: Diagnosis not present

## 2021-08-30 DIAGNOSIS — D472 Monoclonal gammopathy: Secondary | ICD-10-CM | POA: Insufficient documentation

## 2021-08-30 DIAGNOSIS — I3139 Other pericardial effusion (noninflammatory): Secondary | ICD-10-CM | POA: Diagnosis not present

## 2021-08-30 DIAGNOSIS — D649 Anemia, unspecified: Secondary | ICD-10-CM | POA: Diagnosis not present

## 2021-08-30 LAB — SAMPLE TO BLOOD BANK

## 2021-08-30 LAB — CMP (CANCER CENTER ONLY)
ALT: 12 U/L (ref 0–44)
AST: 18 U/L (ref 15–41)
Albumin: 3.8 g/dL (ref 3.5–5.0)
Alkaline Phosphatase: 124 U/L (ref 38–126)
Anion gap: 10 (ref 5–15)
BUN: 12 mg/dL (ref 8–23)
CO2: 26 mmol/L (ref 22–32)
Calcium: 8.4 mg/dL — ABNORMAL LOW (ref 8.9–10.3)
Chloride: 104 mmol/L (ref 98–111)
Creatinine: 0.66 mg/dL (ref 0.44–1.00)
GFR, Estimated: >60 mL/min (ref >60)
Glucose, Bld: 81 mg/dL (ref 70–99)
Potassium: 3.4 mmol/L — ABNORMAL LOW (ref 3.5–5.1)
Sodium: 140 mmol/L (ref 135–145)
Total Bilirubin: 2.6 mg/dL — ABNORMAL HIGH (ref 0.3–1.2)
Total Protein: 6.5 g/dL (ref 6.5–8.1)

## 2021-08-30 LAB — CBC WITH DIFFERENTIAL (CANCER CENTER ONLY)
Abs Immature Granulocytes: 0.05 10*3/uL (ref 0.00–0.07)
Basophils Absolute: 0.1 10*3/uL (ref 0.0–0.1)
Basophils Relative: 1 %
Eosinophils Absolute: 0.1 10*3/uL (ref 0.0–0.5)
Eosinophils Relative: 1 %
HCT: 18.1 % — ABNORMAL LOW (ref 36.0–46.0)
Hemoglobin: 6.3 g/dL — CL (ref 12.0–15.0)
Immature Granulocytes: 1 %
Lymphocytes Relative: 23 %
Lymphs Abs: 2.4 10*3/uL (ref 0.7–4.0)
MCH: 33.3 pg (ref 26.0–34.0)
MCHC: 34.8 g/dL (ref 30.0–36.0)
MCV: 95.8 fL (ref 80.0–100.0)
Monocytes Absolute: 0.8 10*3/uL (ref 0.1–1.0)
Monocytes Relative: 7 %
Neutro Abs: 7.2 10*3/uL (ref 1.7–7.7)
Neutrophils Relative %: 67 %
Platelet Count: 719 10*3/uL — ABNORMAL HIGH (ref 150–400)
RBC: 1.89 MIL/uL — ABNORMAL LOW (ref 3.87–5.11)
RDW: 20.3 % — ABNORMAL HIGH (ref 11.5–15.5)
WBC Count: 10.6 10*3/uL — ABNORMAL HIGH (ref 4.0–10.5)
nRBC: 0 % (ref 0.0–0.2)

## 2021-08-30 LAB — LACTATE DEHYDROGENASE: LDH: 371 U/L — ABNORMAL HIGH (ref 98–192)

## 2021-08-30 LAB — PREPARE RBC (CROSSMATCH)

## 2021-08-30 MED ORDER — ACETAMINOPHEN 325 MG PO TABS
650.0000 mg | ORAL_TABLET | Freq: Once | ORAL | Status: AC
  Administered 2021-08-30: 650 mg via ORAL
  Filled 2021-08-30: qty 2

## 2021-08-30 MED ORDER — SODIUM CHLORIDE 0.9% IV SOLUTION
250.0000 mL | Freq: Once | INTRAVENOUS | Status: AC
  Administered 2021-08-30: 250 mL via INTRAVENOUS

## 2021-08-30 MED ORDER — METHYLPREDNISOLONE SODIUM SUCC 40 MG IJ SOLR
40.0000 mg | Freq: Once | INTRAMUSCULAR | Status: AC
  Administered 2021-08-30: 40 mg via INTRAVENOUS
  Filled 2021-08-30: qty 1

## 2021-08-30 NOTE — Patient Instructions (Signed)
Blood Transfusion, Adult, Care After This sheet gives you information about how to care for yourself after your procedure. Your doctor may also give you more specific instructions. If you have problems or questions, contact your doctor. What can I expect after the procedure? After the procedure, it is common to have: Bruising and soreness at the IV site. A headache. Follow these instructions at home: Insertion site care   Follow instructions from your doctor about how to take care of your insertion site. This is where an IV tube was put into your vein. Make sure you: Wash your hands with soap and water before and after you change your bandage (dressing). If you cannot use soap and water, use hand sanitizer. Change your bandage as told by your doctor. Check your insertion site every day for signs of infection. Check for: Redness, swelling, or pain. Bleeding from the site. Warmth. Pus or a bad smell. General instructions Take over-the-counter and prescription medicines only as told by your doctor. Rest as told by your doctor. Go back to your normal activities as told by your doctor. Keep all follow-up visits as told by your doctor. This is important. Contact a doctor if: You have itching or red, swollen areas of skin (hives). You feel worried or nervous (anxious). You feel weak after doing your normal activities. You have redness, swelling, warmth, or pain around the insertion site. You have blood coming from the insertion site, and the blood does not stop with pressure. You have pus or a bad smell coming from the insertion site. Get help right away if: You have signs of a serious reaction. This may be coming from an allergy or the body's defense system (immune system). Signs include: Trouble breathing or shortness of breath. Swelling of the face or feeling warm (flushed). Fever or chills. Head, chest, or back pain. Dark pee (urine) or blood in the pee. Widespread rash. Fast  heartbeat. Feeling dizzy or light-headed. You may receive your blood transfusion in an outpatient setting. If so, you will be told whom to contact to report any reactions. These symptoms may be an emergency. Do not wait to see if the symptoms will go away. Get medical help right away. Call your local emergency services (911 in the [place] Do not drive yourself to the hospital. Summary Bruising and soreness at the IV site are common. Check your insertion site every day for signs of infection. Rest as told by your doctor. Go back to your normal activities as told by your doctor. Get help right away if you have signs of a serious reaction. This information is not intended to replace advice given to you by your health care provider. Make sure you discuss any questions you have with your health care provider. Document Revised: 01/04/2021 Document Reviewed: 03/04/2019 Elsevier Patient Education  2022 [employer]  

## 2021-08-31 LAB — TYPE AND SCREEN
ABO/RH(D): B POS
Antibody Screen: NEGATIVE
Unit division: 0
Unit division: 0

## 2021-08-31 LAB — BPAM RBC
Blood Product Expiration Date: 202301092359
Blood Product Expiration Date: 202301092359
ISSUE DATE / TIME: 202212081333
ISSUE DATE / TIME: 202212081333
Unit Type and Rh: 5100
Unit Type and Rh: 5100

## 2021-09-03 ENCOUNTER — Telehealth: Payer: Self-pay | Admitting: Hematology

## 2021-09-03 NOTE — Telephone Encounter (Signed)
Scheduled follow-up appointments per 12/8 los. Patient's sister is aware.

## 2021-09-04 ENCOUNTER — Other Ambulatory Visit: Payer: Self-pay | Admitting: Hematology

## 2021-09-04 ENCOUNTER — Other Ambulatory Visit: Payer: Self-pay

## 2021-09-04 DIAGNOSIS — D591 Autoimmune hemolytic anemia, unspecified: Secondary | ICD-10-CM

## 2021-09-04 NOTE — Progress Notes (Signed)
OFF PATHWAY REGIMEN - [Other Dx]  No Change  Continue With Treatment as Ordered.  Original Decision Date/Time: 02/25/2018 01:52   [id number]:Rituximab (Weekly):   Administer weekly:     Rituximab   **Always confirm dose/schedule in your pharmacy ordering system**  Patient Characteristics: Intent of Therapy: Non-Curative / Palliative Intent, Discussed with Patient

## 2021-09-05 ENCOUNTER — Other Ambulatory Visit: Payer: Self-pay

## 2021-09-05 ENCOUNTER — Inpatient Hospital Stay: Payer: Medicare Other

## 2021-09-05 ENCOUNTER — Encounter: Payer: Self-pay | Admitting: Hematology

## 2021-09-05 DIAGNOSIS — D591 Autoimmune hemolytic anemia, unspecified: Secondary | ICD-10-CM

## 2021-09-05 DIAGNOSIS — D5912 Cold autoimmune hemolytic anemia: Secondary | ICD-10-CM | POA: Diagnosis not present

## 2021-09-05 LAB — CBC WITH DIFFERENTIAL (CANCER CENTER ONLY)
Abs Immature Granulocytes: 0.03 10*3/uL (ref 0.00–0.07)
Basophils Absolute: 0.1 10*3/uL (ref 0.0–0.1)
Basophils Relative: 1 %
Eosinophils Absolute: 0.2 10*3/uL (ref 0.0–0.5)
Eosinophils Relative: 1 %
HCT: 25.4 % — ABNORMAL LOW (ref 36.0–46.0)
Hemoglobin: 8.5 g/dL — ABNORMAL LOW (ref 12.0–15.0)
Immature Granulocytes: 0 %
Lymphocytes Relative: 17 %
Lymphs Abs: 1.8 10*3/uL (ref 0.7–4.0)
MCH: 33.5 pg (ref 26.0–34.0)
MCHC: 33.5 g/dL (ref 30.0–36.0)
MCV: 100 fL (ref 80.0–100.0)
Monocytes Absolute: 0.7 10*3/uL (ref 0.1–1.0)
Monocytes Relative: 7 %
Neutro Abs: 8 10*3/uL — ABNORMAL HIGH (ref 1.7–7.7)
Neutrophils Relative %: 74 %
Platelet Count: 397 10*3/uL (ref 150–400)
RBC: 2.54 MIL/uL — ABNORMAL LOW (ref 3.87–5.11)
RDW: 18.6 % — ABNORMAL HIGH (ref 11.5–15.5)
WBC Count: 10.7 10*3/uL — ABNORMAL HIGH (ref 4.0–10.5)
nRBC: 0 % (ref 0.0–0.2)

## 2021-09-05 LAB — SAMPLE TO BLOOD BANK

## 2021-09-05 LAB — CMP (CANCER CENTER ONLY)
ALT: 14 U/L (ref 0–44)
AST: 18 U/L (ref 15–41)
Albumin: 3.7 g/dL (ref 3.5–5.0)
Alkaline Phosphatase: 139 U/L — ABNORMAL HIGH (ref 38–126)
Anion gap: 11 (ref 5–15)
BUN: 15 mg/dL (ref 8–23)
CO2: 27 mmol/L (ref 22–32)
Calcium: 8.4 mg/dL — ABNORMAL LOW (ref 8.9–10.3)
Chloride: 104 mmol/L (ref 98–111)
Creatinine: 0.59 mg/dL (ref 0.44–1.00)
GFR, Estimated: >60 mL/min (ref >60)
Glucose, Bld: 92 mg/dL (ref 70–99)
Potassium: 3.5 mmol/L (ref 3.5–5.1)
Sodium: 142 mmol/L (ref 135–145)
Total Bilirubin: 2.5 mg/dL — ABNORMAL HIGH (ref 0.3–1.2)
Total Protein: 6.7 g/dL (ref 6.5–8.1)

## 2021-09-05 LAB — LACTATE DEHYDROGENASE: LDH: 306 U/L — ABNORMAL HIGH (ref 98–192)

## 2021-09-05 NOTE — Progress Notes (Addendum)
.. HEMATOLOGY ONCOLOGY PROGRESS NOTE  Date of service: 09/06/21   Patient Care Team: [name], MD as PCP - General (Internal Medicine) Brunetta Genera, MD as Consulting Physician (Hematology)   Chief Complaint: Posthospitalization follow-up for cold agglutinin related hemolytic anemia.   INTERVAL HISTORY:   [name] is here for hematology follow-up for continued management of her cold agglutinins related hemolytic anemia. She was recently admitted to the hospital for sepsis from pneumonia and symptomatic anemia due to worsening hemolysis related to her cold agglutinin disease She received about 5 units of PRBC transfusion while hospitalized. We discussed that her hemolysis from her cold agglutinin disease has been worsening despite attempts at cold avoidance. Labs today show hemoglobin is back down to 6.3 with WBC count of 10.6k and platelets of 719k, CMP shows potassium of 3.4, bilirubin of 2.6 with normal creatinine and other electrolytes. LDH 371  Discussed use of Rituxan retreatment which was previously effective in making her transfusion independent for a few years. Patient and her sister who accompanies her agree to this plan.  She recently returned home from skilled nursing facility. Has been having issues with dementia and insomnia.  REVIEW OF SYSTEMS:   .10 Point review of Systems was done is negative except as noted above.  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 Past Surgical History:  Procedure Laterality Date   ABDOMINAL  HYSTERECTOMY  1986   APPENDECTOMY  1967   BREAST EXCISIONAL BIOPSY Right 1984   CATARACT EXTRACTION W/ INTRAOCULAR LENS IMPLANT Right 1990's   congential cataract   CYSTOSCOPY W/ URETERAL STENT PLACEMENT  11/12/2015   Procedure: CYSTOSCOPY WITH RETROGRADE PYELOGRAM/URETERAL STENT PLACEMENT;  Surgeon: [name], MD;  Location: [facility];  Service: Urology;;   CYSTOSCOPY W/ URETERAL STENT REMOVAL Left 11/22/2015   Procedure: CYSTOSCOPY WITH STENT REMOVAL;  Surgeon: [name], MD;  Location: [facility];  Service: Urology;  Laterality: Left;   CYSTOSCOPY/URETEROSCOPY/HOLMIUM LASER Left 11/22/2015   Procedure: CYSTOSCOPY/URETEROSCOPY/HOLMIUM LASER;  Surgeon: [name], MD;  [facility];  Service: Urology;  Laterality: Left;  FLEX URETEROSCOPY   FEMUR IM NAIL Left 08/04/2015   Procedure: INTRAMEDULLARY (IM) NAIL FEMORAL;  Surgeon: [name], MD;  Location: [place];  Service: Orthopedics;  Laterality: Left;   IR KYPHO THORACIC WITH BONE BIOPSY  06/22/2018   TONSILLECTOMY  age 44    . Social History   Tobacco Use   Smoking status: Former    Packs/day: 1.00    Types: Cigarettes    Quit date: 08/03/2015    Years since quitting: 6.0   Smokeless tobacco: Never  Vaping Use   Vaping Use: Never used  Substance Use Topics   Alcohol use: Yes    Comment: occ   Drug use: Never    ALLERGIES:  is allergic to other and shellfish allergy.  MEDICATIONS:  Current Outpatient Medications  Medication Sig Dispense Refill   acetaminophen (TYLENOL) 500 MG tablet Take  500 mg by mouth every 6 (six) hours as needed for moderate pain.     ALPRAZolam (XANAX) 0.25 MG tablet Take 0.25 mg by mouth daily.     traZODone (DESYREL) 50 MG tablet Take 50 mg by mouth at bedtime. 1/2 tablet at bedtime     vitamin B-12 2000 MCG tablet Take 1 tablet (2,000 mcg total) by mouth daily. 30 tablet 2   No current facility-administered medications for this visit.    PHYSICAL  EXAMINATION: ECOG PERFORMANCE STATUS: 2  Vitals:   08/30/21 0930  BP: (!) 135/42  Pulse: 67  Resp: 18  Temp: (!) 97.2 F (36.2 C)  SpO2: 100%   Filed Weights   08/30/21 0930  Weight: 85 lb 11.2 oz (38.9 kg)   Body mass index is 16.19 kg/m.   Exam was given in a chair.   GENERAL:alert, in no acute distress and comfortable SKIN: no acute rashes, no significant lesions EYES: conjunctiva are pink and non-injected, sclera anicteric OROPHARYNX: MMM, no exudates, no oropharyngeal erythema or ulceration NECK: supple, no JVD LYMPH:  no palpable lymphadenopathy in the cervical, axillary or inguinal regions LUNGS: clear to auscultation b/l with normal respiratory effort HEART: regular rate & rhythm ABDOMEN:  normoactive bowel sounds , non tender, not distended. Extremity: no pedal edema PSYCH: alert & oriented x 3 with fluent speech NEURO: no focal motor/sensory deficits   LABORATORY DATA:   I have reviewed the data as listed  . CBC Latest Ref Rng & Units 08/30/2021 08/21/2021  WBC 4.0 - 10.5 K/uL 10.6(H) 9.5  Hemoglobin 12.0 - 15.0 g/dL 6.3(LL) 8.2(L)  Hematocrit 36.0 - 46.0 % 18.1(L) 25.7(L)  Platelets 150 - 400 K/uL 719(H) 628(H)    CMP Latest Ref Rng & Units 08/30/2021 08/19/2021  Glucose 70 - 99 mg/dL 81 -  BUN 8 - 23 mg/dL 12 -  Creatinine 0.44 - 1.00 mg/dL 0.66 -  Sodium 135 - 145 mmol/L 140 -  Potassium 3.5 - 5.1 mmol/L 3.4(L) -  Chloride 98 - 111 mmol/L 104 -  CO2 22 - 32 mmol/L 26 -  Calcium 8.9 - 10.3 mg/dL 8.4(L) -  Total Protein 6.5 - 8.1 g/dL 6.5 5.8(L)  Total Bilirubin 0.3 - 1.2 mg/dL 2.6(H) 2.3(H)  Alkaline Phos 38 - 126 U/L 124 95  AST 15 - 41 U/L 18 19  ALT 0 - 44 U/L 12 17   . Lab Results  Component Value Date   LDH 306 (H) 09/05/2021          RADIOGRAPHIC STUDIES: I have personally reviewed the radiological images as listed and agreed with the findings in the report. CT ABDOMEN PELVIS W CONTRAST  Result Date: 08/09/2021 CLINICAL  DATA:  Abdominal pain, jaundice EXAM: CT ABDOMEN AND PELVIS WITH CONTRAST TECHNIQUE: Multidetector CT imaging of the abdomen and pelvis was performed using the standard protocol following bolus administration of intravenous contrast. CONTRAST:  68mL OMNIPAQUE IOHEXOL 350 MG/ML SOLN COMPARISON:  CT abdomen/pelvis 05/28/2018, CT abdomen/pelvis 05/28/2018 FINDINGS: Lower chest: There are bilateral pleural effusions with adjacent atelectasis, left slightly larger than right. The imaged heart is unremarkable. Hepatobiliary: There are no focal liver lesions. The liver surface contour is mildly nodular. There is cholelithiasis without evidence of acute cholecystitis. There is no intra or extrahepatic biliary ductal dilatation. Pancreas: The pancreas is atrophic. There are no focal lesions or contour abnormalities. There is no main pancreatic ductal dilatation or peripancreatic inflammatory change. Spleen: Unremarkable. Adrenals/Urinary Tract: The adrenals are unremarkable. Slight contour abnormality  in the left interpolar region is unchanged going back to 2019. A hypodense lesion in the right lower pole is too small to characterize but most likely reflects a small cyst. There is a 5 mm nonobstructing left renal stone. There is no hydronephrosis or hydroureter. The bladder is decompressed but grossly unremarkable. Stomach/Bowel: The stomach is unremarkable. There is no evidence of bowel obstruction. There is sigmoid diverticulosis without evidence of acute diverticulitis. There is no evidence of abnormal bowel wall thickening or inflammatory change. Vascular/Lymphatic: There is extensive vascular calcification throughout the nonaneurysmal abdominal aorta. The major branch vessels are patent. The main portal and splenic veins are patent. There is no abdominal or pelvic lymphadenopathy. Reproductive: The uterus is surgically absent. There is a 1.5 cm left adnexal cyst, present since at least 2019. There is no right adnexal  mass. Other: There is trace free fluid in the pelvis in the presacral space. There is no upper abdominal ascites. Musculoskeletal: The patient is status post kyphoplasty at T12. There is compression deformity of the T11, L1, L3, L4, and L5 vertebral bodies, overall progressed since 2019, particularly at L1 and L3. There is no evidence of acute fracture or aggressive osseous lesion. Postsurgical changes are partially imaged in the left femur. IMPRESSION: 1. Nodular surface contour of the liver suspicious for cirrhosis. 2. Cholelithiasis without evidence of acute cholecystitis. No biliary ductal dilatation. 3. 5 mm nonobstructing left renal stone. 4. Bilateral pleural effusions. 5. Trace free fluid in the presacral space, nonspecific. 6. Diverticulosis without evidence of acute diverticulitis. 7. Multiple lumbar compression deformities have overall progressed since 2019, particularly at L1 and L3. Post kyphoplasty changes are noted at T12. Aortic Atherosclerosis (ICD10-I70.0). Electronically Signed   By: Valetta Mole M.D.   On: 08/09/2021 14:35   DG CHEST PORT 1 VIEW  Result Date: 08/10/2021 CLINICAL DATA:  Hypoxia. EXAM: PORTABLE CHEST 1 VIEW COMPARISON:  Chest x-ray 08/09/2021. FINDINGS: The heart is enlarged, unchanged. Diffuse central interstitial prominence and patchy perihilar opacities have increased. Small bilateral pleural effusions are present, increasing on the right. There is no evidence for pneumothorax. No acute fractures are seen. IMPRESSION: 1. Increasing moderate pulmonary edema. 2. Small pleural effusions, increasing on the right. 3. Stable cardiomegaly. Electronically Signed   By: Ronney Asters M.D.   On: 08/10/2021 15:13   DG Chest Portable 1 View  Result Date: 08/09/2021 CLINICAL DATA:  Shortness of breath EXAM: PORTABLE CHEST 1 VIEW COMPARISON:  01/10/2021 FINDINGS: Patient is slightly rotated. Mild cardiomegaly. Increased opacity at the left lung base may reflect a combination of  pleural effusion and consolidation. Right lung field is clear. No pneumothorax. Bony structures appear demineralized. IMPRESSION: Increased opacity at the left lung base may reflect a combination of pleural effusion and consolidation. Electronically Signed   By: Davina Poke D.O.   On: 08/09/2021 14:09    ASSESSMENT & PLAN:   #1: Cold Agglutinin related chronic hemolytic anemia. Her hemoglobin tends to be lower during winter and has been in the 6-8 range. Overall stable hemoglobin levels in the 6-7 range with folic acid replacement and cold avoidance.  #2 IgM lambda MGUS - cannot rule out possibility of lymphoplasmacytic lymphoma though less likely given chronicity of her condition. Flow cytometry- no clonoal B cell lymphoma   #3 History of previous B12 deficiency  #4 hypokalemia- resolved. -Potassium, begin taking BID with orange juice, and pt completed her prednisone course so I expect her potassium levels to increase  -08/28/18 Potassium at 3.6  #5  Low back pain -compression fracture L4 -MRI L spine -Acute/subacute superior endplate fracture at L4 with loss of height of 10-20%. No retropulsed bone. This is quite likely the cause of the low back pain. This was probably present on the radiographs of 01/20/2018, but not diagnosable because of the spinal curvature and unlevel endplates. Old healed superior endplate fracture at H21 Plan -controlled discomfort -prn Tramadol for back pain   #6 Spiculated Lung nodule in right upper lobe  First seen on 05/28/18 CTA Chest Hasn't changed over 6 month interval with repeat CT Chest in February 2020  10/26/18 CT Chest which revealed There is a spiculated solid nodule of the right upper lobe, measuring 5-6 mm and stable from prior examination. Recommend additional follow-up in 12 months to ensure ongoing stability. 2.  Emphysema. 3. Increased height loss deformities of T11 and T12.  08/13/2019 CT Chest ([id number]) revealed "4 mm posterior right  upper lobe pulmonary nodule, unchanged. Dedicated follow-up imaging is not required per Fleischner Society guidelines. This recommendation follows the consensus statement Guidelines for Management of Small Pulmonary Nodules Detected on CT Images: From the Fleischner Society 2017; Radiology 2017; 284:228-243. Aortic Atherosclerosis (ICD10-I70.0) and Emphysema (ICD10-J43.9)."  #7 Vitamin D deficiency December 2019 Vitamin D at 9.6 improved to 39.8  (05/20/2020)  #8 Patient Active Problem List   Diagnosis Date Noted   Sepsis due to pneumonia ([place]) 08/10/2021   Sepsis due to undetermined organism ([place]) 08/09/2021   Aortic atherosclerosis ([place]) 08/09/2021   COPD (chronic obstructive pulmonary disease) ([facility])    Elevated transaminase level    Symptomatic anemia 05/28/2018   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 ([place]) 02/04/2018   Occult blood in stools 02/04/2018   Ureteral calculus 11/12/2015   Breast cancer screening, high risk patient 08/29/2015   Acute delirium 08/07/2015   Postoperative anemia due to acute blood loss 08/06/2015   Protein-calorie malnutrition, severe 08/04/2015   Closed intertrochanteric fracture of left femur ([place]) 08/03/2015   Fall 08/03/2015   Closed right hip fracture ([place]) 08/03/2015   Hyperbilirubinemia 08/01/2013   Hypokalemia 07/30/2013   Vitamin B 12 deficiency 08/07/2012   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   PLAN: -Patient's labs were reviewed with her in detail.  Her hemoglobin is back down to 6.3 and she is significantly symptomatic from this. -Recent hospitalization for pneumonia with sepsis and symptomatic anemia requiring 5 units of PRBCs. -Given increasing  transfusion needs despite cold avoidance we discussed option for retreatment with Rituxan which she tolerated the last time and had good response with transfusion dependence. -Patient is agreeable with this and we shall order and set her up for weekly Rituxan x4 doses with appropriate supportive medications. -We will need to transfuse related to PRBC today and monitor PRBC tomorrow for symptomatic anemia. -I again stressed with her sister in clinic to maintain cold avoidance strictly. -Continue B12, B complex and vitamin D replacement. -given dementia and tendency to delirium and other anemia symptoms would need PRBC transfusion for hgb<7 at this time.  FOLLOW UP: 1 unit of PRBC today 1 unit of PRBC tomorrow Please schedule to start Rituxan weekly x4 with labs as soon as possible Please schedule I unit of PRBC with labs the afternoon before weekly x4 MD  visit 09/20/2021   . The total time spent in the appointment was 35 minutes reviewing her recent hospitalization and course, discussion and consented for PRBC transfusion, discussion regarding treatment with Rituxan, placing orders and management of Rituxan therapy.  All of the patient's questions were answered with apparent satisfaction. The patient knows to call the clinic with any problems, questions or concerns.   Sullivan Lone MD [place] AAHIVMS [facility] Hematology/Oncology Physician [facility]

## 2021-09-06 ENCOUNTER — Telehealth: Payer: Self-pay

## 2021-09-06 ENCOUNTER — Ambulatory Visit: Payer: Medicare Other

## 2021-09-06 NOTE — Telephone Encounter (Signed)
Pt's sister called in to report pt is having diarrhea. Pt has had multiple instances of diarrhea this am. Pt is supposed to have first time Rituxan today. Decision made to start Rituxan next week. Per Dr Irene Limbo pt to be seen by PCP or call us back if diarrhea does not get better or gets worse. Pt's sister acknowledged and verbalized understanding.

## 2021-09-11 ENCOUNTER — Other Ambulatory Visit: Payer: Self-pay

## 2021-09-11 DIAGNOSIS — D591 Autoimmune hemolytic anemia, unspecified: Secondary | ICD-10-CM

## 2021-09-12 ENCOUNTER — Other Ambulatory Visit: Payer: Self-pay

## 2021-09-12 ENCOUNTER — Inpatient Hospital Stay: Payer: Medicare Other

## 2021-09-12 ENCOUNTER — Other Ambulatory Visit: Payer: Self-pay | Admitting: Hematology and Oncology

## 2021-09-12 DIAGNOSIS — D591 Autoimmune hemolytic anemia, unspecified: Secondary | ICD-10-CM

## 2021-09-12 DIAGNOSIS — D5912 Cold autoimmune hemolytic anemia: Secondary | ICD-10-CM | POA: Diagnosis not present

## 2021-09-12 LAB — CBC WITH DIFFERENTIAL (CANCER CENTER ONLY)
Abs Immature Granulocytes: 0.04 10*3/uL (ref 0.00–0.07)
Basophils Absolute: 0.1 10*3/uL (ref 0.0–0.1)
Basophils Relative: 1 %
Eosinophils Absolute: 0.1 10*3/uL (ref 0.0–0.5)
Eosinophils Relative: 1 %
HCT: 20.8 % — ABNORMAL LOW (ref 36.0–46.0)
Hemoglobin: 6.9 g/dL — CL (ref 12.0–15.0)
Immature Granulocytes: 0 %
Lymphocytes Relative: 19 %
Lymphs Abs: 2.1 10*3/uL (ref 0.7–4.0)
MCH: 32.7 pg (ref 26.0–34.0)
MCHC: 33.2 g/dL (ref 30.0–36.0)
MCV: 98.6 fL (ref 80.0–100.0)
Monocytes Absolute: 0.5 10*3/uL (ref 0.1–1.0)
Monocytes Relative: 5 %
Neutro Abs: 7.8 10*3/uL — ABNORMAL HIGH (ref 1.7–7.7)
Neutrophils Relative %: 74 %
Platelet Count: 624 10*3/uL — ABNORMAL HIGH (ref 150–400)
RBC: 2.11 MIL/uL — ABNORMAL LOW (ref 3.87–5.11)
RDW: 18 % — ABNORMAL HIGH (ref 11.5–15.5)
WBC Count: 10.7 10*3/uL — ABNORMAL HIGH (ref 4.0–10.5)
nRBC: 0 % (ref 0.0–0.2)

## 2021-09-12 LAB — SAMPLE TO BLOOD BANK

## 2021-09-12 LAB — CMP (CANCER CENTER ONLY)
ALT: 10 U/L (ref 0–44)
AST: 17 U/L (ref 15–41)
Albumin: 3.6 g/dL (ref 3.5–5.0)
Alkaline Phosphatase: 130 U/L — ABNORMAL HIGH (ref 38–126)
Anion gap: 9 (ref 5–15)
BUN: 9 mg/dL (ref 8–23)
CO2: 29 mmol/L (ref 22–32)
Calcium: 8.2 mg/dL — ABNORMAL LOW (ref 8.9–10.3)
Chloride: 103 mmol/L (ref 98–111)
Creatinine: 0.51 mg/dL (ref 0.44–1.00)
GFR, Estimated: >60 mL/min (ref >60)
Glucose, Bld: 88 mg/dL (ref 70–99)
Potassium: 3.5 mmol/L (ref 3.5–5.1)
Sodium: 141 mmol/L (ref 135–145)
Total Bilirubin: 3 mg/dL — ABNORMAL HIGH (ref 0.3–1.2)
Total Protein: 6.1 g/dL — ABNORMAL LOW (ref 6.5–8.1)

## 2021-09-12 LAB — PREPARE RBC (CROSSMATCH)

## 2021-09-12 MED ORDER — METHYLPREDNISOLONE SODIUM SUCC 40 MG IJ SOLR
40.0000 mg | Freq: Once | INTRAMUSCULAR | Status: AC
  Administered 2021-09-12: 13:00:00 40 mg via INTRAVENOUS
  Filled 2021-09-12: qty 1

## 2021-09-12 MED ORDER — ACETAMINOPHEN 325 MG PO TABS
650.0000 mg | ORAL_TABLET | Freq: Once | ORAL | Status: AC
  Administered 2021-09-12: 13:00:00 650 mg via ORAL
  Filled 2021-09-12: qty 2

## 2021-09-12 MED ORDER — SODIUM CHLORIDE 0.9% IV SOLUTION: 250.0000 mL | Freq: Once | INTRAVENOUS | Status: DC

## 2021-09-12 NOTE — Patient Instructions (Signed)
Blood Transfusion, Adult, Care After This sheet gives you information about how to care for yourself after your procedure. Your doctor may also give you more specific instructions. If you have problems or questions, contact your doctor. What can I expect after the procedure? After the procedure, it is common to have: Bruising and soreness at the IV site. A headache. Follow these instructions at home: Insertion site care   Follow instructions from your doctor about how to take care of your insertion site. This is where an IV tube was put into your vein. Make sure you: Wash your hands with soap and water before and after you change your bandage (dressing). If you cannot use soap and water, use hand sanitizer. Change your bandage as told by your doctor. Check your insertion site every day for signs of infection. Check for: Redness, swelling, or pain. Bleeding from the site. Warmth. Pus or a bad smell. General instructions Take over-the-counter and prescription medicines only as told by your doctor. Rest as told by your doctor. Go back to your normal activities as told by your doctor. Keep all follow-up visits as told by your doctor. This is important. Contact a doctor if: You have itching or red, swollen areas of skin (hives). You feel worried or nervous (anxious). You feel weak after doing your normal activities. You have redness, swelling, warmth, or pain around the insertion site. You have blood coming from the insertion site, and the blood does not stop with pressure. You have pus or a bad smell coming from the insertion site. Get help right away if: You have signs of a serious reaction. This may be coming from an allergy or the body's defense system (immune system). Signs include: Trouble breathing or shortness of breath. Swelling of the face or feeling warm (flushed). Fever or chills. Head, chest, or back pain. Dark pee (urine) or blood in the pee. Widespread rash. Fast  heartbeat. Feeling dizzy or light-headed. You may receive your blood transfusion in an outpatient setting. If so, you will be told whom to contact to report any reactions. These symptoms may be an emergency. Do not wait to see if the symptoms will go away. Get medical help right away. Call your local emergency services (911 in the [place] Do not drive yourself to the hospital. Summary Bruising and soreness at the IV site are common. Check your insertion site every day for signs of infection. Rest as told by your doctor. Go back to your normal activities as told by your doctor. Get help right away if you have signs of a serious reaction. This information is not intended to replace advice given to you by your health care provider. Make sure you discuss any questions you have with your health care provider. Document Revised: 01/04/2021 Document Reviewed: 03/04/2019 Elsevier Patient Education  2022 [employer]  

## 2021-09-13 ENCOUNTER — Inpatient Hospital Stay: Payer: Medicare Other

## 2021-09-13 VITALS — BP 146/67 | HR 69 | Temp 98.5°F | Resp 18 | Wt 96.0 lb

## 2021-09-13 DIAGNOSIS — D5912 Cold autoimmune hemolytic anemia: Secondary | ICD-10-CM

## 2021-09-13 LAB — TYPE AND SCREEN
ABO/RH(D): B POS
Antibody Screen: NEGATIVE
Unit division: 0
Unit division: 0

## 2021-09-13 LAB — BPAM RBC
Blood Product Expiration Date: 202301142359
Blood Product Expiration Date: 202301142359
ISSUE DATE / TIME: 202212211334
ISSUE DATE / TIME: 202212211511
Unit Type and Rh: 5100
Unit Type and Rh: 5100

## 2021-09-13 MED ORDER — METHYLPREDNISOLONE SODIUM SUCC 125 MG IJ SOLR
80.0000 mg | Freq: Once | INTRAMUSCULAR | Status: AC
  Administered 2021-09-13: 12:00:00 80 mg via INTRAVENOUS
  Filled 2021-09-13: qty 2

## 2021-09-13 MED ORDER — SODIUM CHLORIDE 0.9 % IV SOLN
375.0000 mg/m2 | Freq: Once | INTRAVENOUS | Status: AC
  Administered 2021-09-13: 14:00:00 500 mg via INTRAVENOUS
  Filled 2021-09-13: qty 50

## 2021-09-13 MED ORDER — SODIUM CHLORIDE 0.9 % IV SOLN: Freq: Once | INTRAVENOUS | Status: AC

## 2021-09-13 MED ORDER — DIPHENHYDRAMINE HCL 25 MG PO CAPS
25.0000 mg | ORAL_CAPSULE | Freq: Once | ORAL | Status: AC
  Administered 2021-09-13: 12:00:00 25 mg via ORAL
  Filled 2021-09-13: qty 1

## 2021-09-13 MED ORDER — ACETAMINOPHEN 325 MG PO TABS
650.0000 mg | ORAL_TABLET | Freq: Once | ORAL | Status: AC
  Administered 2021-09-13: 12:00:00 650 mg via ORAL
  Filled 2021-09-13: qty 2

## 2021-09-13 MED ORDER — FAMOTIDINE 20 MG IN NS 100 ML IVPB
20.0000 mg | Freq: Once | INTRAVENOUS | Status: AC
  Administered 2021-09-13: 13:00:00 20 mg via INTRAVENOUS
  Filled 2021-09-13: qty 100

## 2021-09-13 NOTE — Progress Notes (Signed)
Per Dr. Lorenso Courier & pharmacy OK to infuse Rituxan at room temp

## 2021-09-13 NOTE — Progress Notes (Signed)
Pt's HGB 6.9 on 09/13/21 , was tx with 2 units of blood. Pt ok for tx today.

## 2021-09-13 NOTE — Patient Instructions (Signed)
[facility]  Discharge Instructions: Thank you for choosing [facility] to provide your oncology and hematology care.   If you have a lab appointment with the [facility], please go directly to the [facility] and check in at the registration area.   Wear comfortable clothing and clothing appropriate for easy access to any Portacath or PICC line.   We strive to give you quality time with your provider. You may need to reschedule your appointment if you arrive late (15 or more minutes).  Arriving late affects you and other patients whose appointments are after yours.  Also, if you miss three or more appointments without notifying the office, you may be dismissed from the clinic at the providers discretion.      For prescription refill requests, have your pharmacy contact our office and allow 72 hours for refills to be completed.    Today you received the following chemotherapy and/or immunotherapy agents: Rituxan   To help prevent nausea and vomiting after your treatment, we encourage you to take your nausea medication as directed.  BELOW ARE SYMPTOMS THAT SHOULD BE REPORTED IMMEDIATELY: *FEVER GREATER THAN 100.4 F (38 C) OR HIGHER *CHILLS OR SWEATING *NAUSEA AND VOMITING THAT IS NOT CONTROLLED WITH YOUR NAUSEA MEDICATION *UNUSUAL SHORTNESS OF BREATH *UNUSUAL BRUISING OR BLEEDING *URINARY PROBLEMS (pain or burning when urinating, or frequent urination) *BOWEL PROBLEMS (unusual diarrhea, constipation, pain near the anus) TENDERNESS IN MOUTH AND THROAT WITH OR WITHOUT PRESENCE OF ULCERS (sore throat, sores in mouth, or a toothache) UNUSUAL RASH, SWELLING OR PAIN  UNUSUAL VAGINAL DISCHARGE OR ITCHING   Items with * indicate a potential emergency and should be followed up as soon as possible or go to the Emergency Department if any problems should occur.  Please show the CHEMOTHERAPY ALERT CARD or IMMUNOTHERAPY ALERT CARD at check-in to the  Emergency Department and triage nurse.  Should you have questions after your visit or need to cancel or reschedule your appointment, please contact [facility]  Dept: [phone]  and follow the prompts.  Office hours are 8:00 a.m. to 4:30 p.m. Monday - Friday. Please note that voicemails left after 4:00 p.m. may not be returned until the following business day.  We are closed weekends and major holidays. You have access to a nurse at all times for urgent questions. Please call the main number to the clinic Dept: [phone] and follow the prompts.   For any non-urgent questions, you may also contact your provider using MyChart. We now offer e-Visits for anyone 42 and older to request care online for non-urgent symptoms. For details visit [email or web address].   Also download the MyChart app! Go to the app store, search "MyChart", open the app, select [facility], and log in with your MyChart username and password.  Due to Covid, a mask is required upon entering the hospital/clinic. If you do not have a mask, one will be given to you upon arrival. For doctor visits, patients may have 1 support person aged 67 or older with them. For treatment visits, patients cannot have anyone with them due to current Covid guidelines and our immunocompromised population.   Rituximab Injection What is this medication? RITUXIMAB (ri TUX i mab) is a monoclonal antibody. It is used to treat certain types of cancer like non-Hodgkin lymphoma and chronic lymphocytic leukemia. It is also used to treat rheumatoid arthritis, granulomatosis with polyangiitis, microscopic polyangiitis, and pemphigus vulgaris. This medicine may be used  for other purposes; ask your health care provider or pharmacist if you have questions. COMMON BRAND NAME(S): RIABNI, Rituxan, RUXIENCE What should I tell my care team before I take this medication? They need to know if you have any of these conditions: chest  pain heart disease infection especially a viral infection such as chickenpox, cold sores, hepatitis B, or herpes immune system problems irregular heartbeat or rhythm kidney disease low blood counts (white cells, platelets, or red cells) lung disease recent or upcoming vaccine an unusual or allergic reaction to rituximab, other medicines, foods, dyes, or preservatives pregnant or trying to get pregnant breast-feeding How should I use this medication? This medicine is injected into a vein. It is given by a health care provider in a hospital or clinic setting. A special MedGuide will be given to you before each treatment. Be sure to read this information carefully each time. Talk to your health care provider about the use of this medicine in children. While this drug may be prescribed for children as young as 6 months for selected conditions, precautions do apply. Overdosage: If you think you have taken too much of this medicine contact a poison control center or emergency room at once. NOTE: This medicine is only for you. Do not share this medicine with others. What if I miss a dose? Keep appointments for follow-up doses. It is important not to miss your dose. Call your health care provider if you are unable to keep an appointment. What may interact with this medication? Do not take this medicine with any of the following medicines: live vaccines This medicine may also interact with the following medicines: cisplatin This list may not describe all possible interactions. Give your health care provider a list of all the medicines, herbs, non-prescription drugs, or dietary supplements you use. Also tell them if you smoke, drink alcohol, or use illegal drugs. Some items may interact with your medicine. What should I watch for while using this medication? Your condition will be monitored carefully while you are receiving this medicine. You may need blood work done while you are taking this  medicine. This medicine can cause serious infusion reactions. To reduce the risk your health care provider may give you other medicines to take before receiving this one. Be sure to follow the directions from your health care provider. This medicine may increase your risk of getting an infection. Call your health care provider for advice if you get a fever, chills, sore throat, or other symptoms of a cold or flu. Do not treat yourself. Try to avoid being around people who are sick. Call your health care provider if you are around anyone with measles, chickenpox, or if you develop sores or blisters that do not heal properly. Avoid taking medicines that contain aspirin, acetaminophen, ibuprofen, naproxen, or ketoprofen unless instructed by your health care provider. These medicines may hide a fever. This medicine may cause serious skin reactions. They can happen weeks to months after starting the medicine. Contact your health care provider right away if you notice fevers or flu-like symptoms with a rash. The rash may be red or purple and then turn into blisters or peeling of the skin. Or, you might notice a red rash with swelling of the face, lips or lymph nodes in your neck or under your arms. In some patients, this medicine may cause a serious brain infection that may cause death. If you have any problems seeing, thinking, speaking, walking, or standing, tell your healthcare professional right  away. If you cannot reach your healthcare professional, urgently seek other source of medical care. Do not become pregnant while taking this medicine or for at least 12 months after stopping it. Women should inform their health care provider if they wish to become pregnant or think they might be pregnant. There is potential for serious harm to an unborn child. Talk to your health care provider for more information. Women should use a reliable form of birth control while taking this medicine and for 12 months after  stopping it. Do not breast-feed while taking this medicine or for at least 6 months after stopping it. What side effects may I notice from receiving this medication? Side effects that you should report to your health care provider as soon as possible: allergic reactions (skin rash, itching or hives; swelling of the face, lips, or tongue) diarrhea edema (sudden weight gain; swelling of the ankles, feet, hands or other unusual swelling; trouble breathing) fast, irregular heartbeat heart attack (trouble breathing; pain or tightness in the chest, neck, back or arms; unusually weak or tired) infection (fever, chills, cough, sore throat, pain or trouble passing urine) kidney injury (trouble passing urine or change in the amount of urine) liver injury (dark yellow or brown urine; general ill feeling or flu-like symptoms; loss of appetite, right upper belly pain; unusually weak or tired, yellowing of the eyes or skin) low blood pressure (dizziness; feeling faint or lightheaded, falls; unusually weak or tired) low red blood cell counts (trouble breathing; feeling faint; lightheaded, falls; unusually weak or tired) mouth sores redness, blistering, peeling, or loosening of the skin, including inside the mouth stomach pain unusual bruising or bleeding wheezing (trouble breathing with loud or whistling sounds) vomiting Side effects that usually do not require medical attention (report to your health care provider if they continue or are bothersome): headache joint pain muscle cramps, pain nausea This list may not describe all possible side effects. Call your doctor for medical advice about side effects. You may report side effects to FDA at 1-800-FDA-1088. Where should I keep my medication? This medicine is given in a hospital or clinic. It will not be stored at home. NOTE: This sheet is a summary. It may not cover all possible information. If you have questions about this medicine, talk to your  doctor, pharmacist, or health care provider.  2022 [employer]vier/Gold Standard (2020-09-11 00:00:00)

## 2021-09-14 ENCOUNTER — Other Ambulatory Visit: Payer: Self-pay

## 2021-09-14 DIAGNOSIS — D5912 Cold autoimmune hemolytic anemia: Secondary | ICD-10-CM

## 2021-09-18 ENCOUNTER — Other Ambulatory Visit: Payer: Medicare Other

## 2021-09-20 ENCOUNTER — Inpatient Hospital Stay: Payer: Medicare Other

## 2021-09-20 ENCOUNTER — Other Ambulatory Visit: Payer: Self-pay

## 2021-09-20 ENCOUNTER — Inpatient Hospital Stay: Payer: Medicare Other | Admitting: Hematology

## 2021-09-20 ENCOUNTER — Other Ambulatory Visit: Payer: Self-pay | Admitting: Hematology

## 2021-09-20 VITALS — BP 138/42 | HR 65 | Temp 98.5°F | Resp 16 | Wt 95.0 lb

## 2021-09-20 DIAGNOSIS — D5912 Cold autoimmune hemolytic anemia: Secondary | ICD-10-CM | POA: Diagnosis not present

## 2021-09-20 DIAGNOSIS — R404 Transient alteration of awareness: Secondary | ICD-10-CM

## 2021-09-20 DIAGNOSIS — G4700 Insomnia, unspecified: Secondary | ICD-10-CM

## 2021-09-20 DIAGNOSIS — D591 Autoimmune hemolytic anemia, unspecified: Secondary | ICD-10-CM | POA: Diagnosis not present

## 2021-09-20 LAB — CMP (CANCER CENTER ONLY)
ALT: 7 U/L (ref 0–44)
AST: 12 U/L — ABNORMAL LOW (ref 15–41)
Albumin: 3.5 g/dL (ref 3.5–5.0)
Alkaline Phosphatase: 111 U/L (ref 38–126)
Anion gap: 6 (ref 5–15)
BUN: 11 mg/dL (ref 8–23)
CO2: 34 mmol/L — ABNORMAL HIGH (ref 22–32)
Calcium: 8.3 mg/dL — ABNORMAL LOW (ref 8.9–10.3)
Chloride: 102 mmol/L (ref 98–111)
Creatinine: 0.57 mg/dL (ref 0.44–1.00)
GFR, Estimated: >60 mL/min (ref >60)
Glucose, Bld: 106 mg/dL — ABNORMAL HIGH (ref 70–99)
Potassium: 3.2 mmol/L — ABNORMAL LOW (ref 3.5–5.1)
Sodium: 142 mmol/L (ref 135–145)
Total Bilirubin: 2.2 mg/dL — ABNORMAL HIGH (ref 0.3–1.2)
Total Protein: 6 g/dL — ABNORMAL LOW (ref 6.5–8.1)

## 2021-09-20 LAB — CBC WITH DIFFERENTIAL (CANCER CENTER ONLY)
Abs Immature Granulocytes: 0.03 10*3/uL (ref 0.00–0.07)
Basophils Absolute: 0.1 10*3/uL (ref 0.0–0.1)
Basophils Relative: 1 %
Eosinophils Absolute: 0.1 10*3/uL (ref 0.0–0.5)
Eosinophils Relative: 2 %
HCT: 24.6 % — ABNORMAL LOW (ref 36.0–46.0)
Hemoglobin: 8.1 g/dL — ABNORMAL LOW (ref 12.0–15.0)
Immature Granulocytes: 0 %
Lymphocytes Relative: 21 %
Lymphs Abs: 1.5 10*3/uL (ref 0.7–4.0)
MCH: 31.9 pg (ref 26.0–34.0)
MCHC: 32.9 g/dL (ref 30.0–36.0)
MCV: 96.9 fL (ref 80.0–100.0)
Monocytes Absolute: 0.7 10*3/uL (ref 0.1–1.0)
Monocytes Relative: 10 %
Neutro Abs: 4.7 10*3/uL (ref 1.7–7.7)
Neutrophils Relative %: 66 %
Platelet Count: 557 10*3/uL — ABNORMAL HIGH (ref 150–400)
RBC: 2.54 MIL/uL — ABNORMAL LOW (ref 3.87–5.11)
RDW: 18.5 % — ABNORMAL HIGH (ref 11.5–15.5)
WBC Count: 7.2 10*3/uL (ref 4.0–10.5)
nRBC: 0 % (ref 0.0–0.2)

## 2021-09-20 LAB — SAMPLE TO BLOOD BANK

## 2021-09-20 MED ORDER — ACETAMINOPHEN 325 MG PO TABS
650.0000 mg | ORAL_TABLET | Freq: Once | ORAL | Status: AC
  Administered 2021-09-20: 14:00:00 650 mg via ORAL
  Filled 2021-09-20: qty 2

## 2021-09-20 MED ORDER — SODIUM CHLORIDE 0.9 % IV SOLN: Freq: Once | INTRAVENOUS | Status: AC

## 2021-09-20 MED ORDER — DIPHENHYDRAMINE HCL 25 MG PO CAPS
25.0000 mg | ORAL_CAPSULE | Freq: Once | ORAL | Status: AC
  Administered 2021-09-20: 14:00:00 25 mg via ORAL
  Filled 2021-09-20: qty 1

## 2021-09-20 MED ORDER — METHYLPREDNISOLONE SODIUM SUCC 125 MG IJ SOLR
80.0000 mg | Freq: Every day | INTRAMUSCULAR | Status: DC
  Administered 2021-09-20: 14:00:00 80 mg via INTRAVENOUS
  Filled 2021-09-20: qty 2

## 2021-09-20 MED ORDER — FAMOTIDINE 20 MG IN NS 100 ML IVPB
20.0000 mg | Freq: Once | INTRAVENOUS | Status: AC
  Administered 2021-09-20: 14:00:00 20 mg via INTRAVENOUS
  Filled 2021-09-20: qty 100

## 2021-09-20 MED ORDER — QUETIAPINE FUMARATE 25 MG PO TABS
25.0000 mg | ORAL_TABLET | Freq: Every day | ORAL | 0 refills | Status: DC
Start: 2021-09-20 — End: 2021-10-09

## 2021-09-20 MED ORDER — SODIUM CHLORIDE 0.9 % IV SOLN
375.0000 mg/m2 | Freq: Once | INTRAVENOUS | Status: AC
  Administered 2021-09-20: 15:00:00 500 mg via INTRAVENOUS
  Filled 2021-09-20: qty 50

## 2021-09-20 NOTE — Patient Instructions (Signed)
[name]  Discharge Instructions: Thank you for choosing [facility] to provide your oncology and hematology care.   If you have a lab appointment with the Cancer Center, please go directly to the Cancer Center and check in at the registration area.   Wear comfortable clothing and clothing appropriate for easy access to any Portacath or PICC line.   We strive to give you quality time with your provider. You may need to reschedule your appointment if you arrive late (15 or more minutes).  Arriving late affects you and other patients whose appointments are after yours.  Also, if you miss three or more appointments without notifying the office, you may be dismissed from the clinic at the provider's discretion.      For prescription refill requests, have your pharmacy contact our office and allow 72 hours for refills to be completed.    Today you received the following chemotherapy and/or immunotherapy agents Rituxan      To help prevent nausea and vomiting after your treatment, we encourage you to take your nausea medication as directed.  BELOW ARE SYMPTOMS THAT SHOULD BE REPORTED IMMEDIATELY: *FEVER GREATER THAN 100.4 F (38 C) OR HIGHER *CHILLS OR SWEATING *NAUSEA AND VOMITING THAT IS NOT CONTROLLED WITH YOUR NAUSEA MEDICATION *UNUSUAL SHORTNESS OF BREATH *UNUSUAL BRUISING OR BLEEDING *URINARY PROBLEMS (pain or burning when urinating, or frequent urination) *BOWEL PROBLEMS (unusual diarrhea, constipation, pain near the anus) TENDERNESS IN MOUTH AND THROAT WITH OR WITHOUT PRESENCE OF ULCERS (sore throat, sores in mouth, or a toothache) UNUSUAL RASH, SWELLING OR PAIN  UNUSUAL VAGINAL DISCHARGE OR ITCHING   Items with * indicate a potential emergency and should be followed up as soon as possible or go to the Emergency Department if any problems should occur.  Please show the CHEMOTHERAPY ALERT CARD or IMMUNOTHERAPY ALERT CARD at check-in to the  Emergency Department and triage nurse.  Should you have questions after your visit or need to cancel or reschedule your appointment, please contact [facility]  Dept: [phone]  and follow the prompts.  Office hours are 8:00 a.m. to 4:30 p.m. Monday - Friday. Please note that voicemails left after 4:00 p.m. may not be returned until the following business day.  We are closed weekends and major holidays. You have access to a nurse at all times for urgent questions. Please call the main number to the clinic Dept: [phone] and follow the prompts.   For any non-urgent questions, you may also contact your provider using MyChart. We now offer e-Visits for anyone 18 and older to request care online for non-urgent symptoms. For details visit [email or web address].   Also download the MyChart app! Go to the app store, search "MyChart", open the app, select [facility], and log in with your MyChart username and password.  Due to Covid, a mask is required upon entering the hospital/clinic. If you do not have a mask, one will be given to you upon arrival. For doctor visits, patients may have 1 support person aged 18 or older with them. For treatment visits, patients cannot have anyone with them due to current Covid guidelines and our immunocompromised population.   

## 2021-09-20 NOTE — Progress Notes (Signed)
While RN was taking vitals for 2nd bump up for rituxan (200 mg/hr), patient appeared to be extremely lethargic and minimally responsive. Vitals assessed and were stable, rituxan was paused, and Dr. Irene Limbo to see patient in infusion. CBG was taken per verbal order Dr. Irene Limbo and was 95 at 1600 and 3L O2 placed. Per pt's sister, patient has not slept in several days due to dx of dementia. Patient became slightly more responsive and vitals remained stable. Dr. Irene Limbo advised to proceed with rituxan infusion as patient was stable. Once rituxan was resumed at 1618, IV was occluded and had to be removed. RN attempted to [place] patient without success. Dr. Irene Limbo notified and decided to discontinue infusion for today. Patient ate a honeybun and drank some coffee and was fully responsive upon leaving infusion room. Per Dr. Irene Limbo, patient safe to be discharged home as she is in stable condition.

## 2021-09-20 NOTE — Progress Notes (Signed)
MD has reviewed labs from today. MD aware Bili is 2.2 and is giving ok for pt's tx today.

## 2021-09-21 LAB — GLUCOSE, CAPILLARY: Glucose-Capillary: 95 mg/dL (ref 70–99)

## 2021-09-23 ENCOUNTER — Encounter: Payer: Self-pay | Admitting: Hematology

## 2021-09-25 ENCOUNTER — Other Ambulatory Visit: Payer: Self-pay

## 2021-09-25 ENCOUNTER — Encounter: Payer: Self-pay | Admitting: Hematology

## 2021-09-25 DIAGNOSIS — D5912 Cold autoimmune hemolytic anemia: Secondary | ICD-10-CM

## 2021-09-26 ENCOUNTER — Inpatient Hospital Stay: Payer: Medicare Other

## 2021-09-26 ENCOUNTER — Encounter: Payer: Self-pay | Admitting: Hematology

## 2021-09-26 ENCOUNTER — Other Ambulatory Visit: Payer: Self-pay

## 2021-09-26 ENCOUNTER — Inpatient Hospital Stay: Payer: Medicare Other | Attending: Hematology

## 2021-09-26 DIAGNOSIS — F0390 Unspecified dementia without behavioral disturbance: Secondary | ICD-10-CM | POA: Diagnosis not present

## 2021-09-26 DIAGNOSIS — E538 Deficiency of other specified B group vitamins: Secondary | ICD-10-CM | POA: Insufficient documentation

## 2021-09-26 DIAGNOSIS — I2510 Atherosclerotic heart disease of native coronary artery without angina pectoris: Secondary | ICD-10-CM | POA: Diagnosis not present

## 2021-09-26 DIAGNOSIS — Z8719 Personal history of other diseases of the digestive system: Secondary | ICD-10-CM | POA: Insufficient documentation

## 2021-09-26 DIAGNOSIS — Z87891 Personal history of nicotine dependence: Secondary | ICD-10-CM | POA: Insufficient documentation

## 2021-09-26 DIAGNOSIS — D5912 Cold autoimmune hemolytic anemia: Secondary | ICD-10-CM | POA: Insufficient documentation

## 2021-09-26 DIAGNOSIS — M545 Low back pain, unspecified: Secondary | ICD-10-CM | POA: Diagnosis not present

## 2021-09-26 DIAGNOSIS — G4700 Insomnia, unspecified: Secondary | ICD-10-CM | POA: Insufficient documentation

## 2021-09-26 DIAGNOSIS — D472 Monoclonal gammopathy: Secondary | ICD-10-CM | POA: Insufficient documentation

## 2021-09-26 DIAGNOSIS — I700 Atherosclerosis of aorta: Secondary | ICD-10-CM | POA: Diagnosis not present

## 2021-09-26 DIAGNOSIS — Z79899 Other long term (current) drug therapy: Secondary | ICD-10-CM | POA: Insufficient documentation

## 2021-09-26 LAB — CMP (CANCER CENTER ONLY)
ALT: 8 U/L (ref 0–44)
AST: 16 U/L (ref 15–41)
Albumin: 3.8 g/dL (ref 3.5–5.0)
Alkaline Phosphatase: 130 U/L — ABNORMAL HIGH (ref 38–126)
Anion gap: 8 (ref 5–15)
BUN: 8 mg/dL (ref 8–23)
CO2: 36 mmol/L — ABNORMAL HIGH (ref 22–32)
Calcium: 8.3 mg/dL — ABNORMAL LOW (ref 8.9–10.3)
Chloride: 96 mmol/L — ABNORMAL LOW (ref 98–111)
Creatinine: 0.57 mg/dL (ref 0.44–1.00)
GFR, Estimated: >60 mL/min (ref >60)
Glucose, Bld: 105 mg/dL — ABNORMAL HIGH (ref 70–99)
Potassium: 3.9 mmol/L (ref 3.5–5.1)
Sodium: 140 mmol/L (ref 135–145)
Total Bilirubin: 2 mg/dL — ABNORMAL HIGH (ref 0.3–1.2)
Total Protein: 6.5 g/dL (ref 6.5–8.1)

## 2021-09-26 LAB — CBC WITH DIFFERENTIAL (CANCER CENTER ONLY)
Abs Immature Granulocytes: 0.08 10*3/uL — ABNORMAL HIGH (ref 0.00–0.07)
Basophils Absolute: 0.1 10*3/uL (ref 0.0–0.1)
Basophils Relative: 1 %
Eosinophils Absolute: 0.2 10*3/uL (ref 0.0–0.5)
Eosinophils Relative: 1 %
HCT: 24.3 % — ABNORMAL LOW (ref 36.0–46.0)
Hemoglobin: 8.5 g/dL — ABNORMAL LOW (ref 12.0–15.0)
Immature Granulocytes: 1 %
Lymphocytes Relative: 15 %
Lymphs Abs: 2.2 10*3/uL (ref 0.7–4.0)
MCH: 34.8 pg — ABNORMAL HIGH (ref 26.0–34.0)
MCHC: 35 g/dL (ref 30.0–36.0)
MCV: 99.6 fL (ref 80.0–100.0)
Monocytes Absolute: 1 10*3/uL (ref 0.1–1.0)
Monocytes Relative: 7 %
Neutro Abs: 10.7 10*3/uL — ABNORMAL HIGH (ref 1.7–7.7)
Neutrophils Relative %: 75 %
Platelet Count: 733 10*3/uL — ABNORMAL HIGH (ref 150–400)
RBC: 2.44 MIL/uL — ABNORMAL LOW (ref 3.87–5.11)
RDW: 19.6 % — ABNORMAL HIGH (ref 11.5–15.5)
WBC Count: 14.2 10*3/uL — ABNORMAL HIGH (ref 4.0–10.5)
nRBC: 0 % (ref 0.0–0.2)

## 2021-09-26 NOTE — Progress Notes (Signed)
.. HEMATOLOGY ONCOLOGY PROGRESS NOTE  Date of service: .09/20/2021   Patient Care Team: [name], MD as PCP - General (Internal Medicine) Brunetta Genera, MD as Consulting Physician (Hematology)   Chief Complaint: Follow-up prior to her second dose of weekly Rituxan for cold agglutinin related hemolytic anemia.  INTERVAL HISTORY:   Ms Shan is here for follow-up of her second weekly dose of Rituxan.  She is accompanied by her sister for this visit.  Patient and sister note no acute toxicities from her first dose of Rituxan 1 week ago. She is still having challenges with her significant dementia with confusion and has had wandering and severe insomnia.  Patient's sister notes that Sameria has not slept for the last 4 days despite using sedatives including Xanax and trazodone prescribed by her primary care physician.  Patient sister requests alternative sedative to help her sleep.  Patient few hours into her infusion fell asleep and was very difficult to arouse.  Her fingerstick blood sugar was within normal limits EKG was within normal limits vitals were stable.  It appears that the patient was just very deep sleep and difficult to arouse.  She subsequently did wake up and was able to drink tea and coffee and was verbal and back to her baseline prior to discharge.  She was only able to complete part of her dose of Rituxan today since this was held when she became less responsive.  I continued goals of care discussion with her sister.  I did discuss that Ms. Lisanne Ponce will need 24/7 supervision.  She noted the patient's mother who lives with her was resistant to having someone stay overnight or to home care but might be coming around to excepting 24/7 cares at home or potentially moving to a skilled nursing facility with memory care unit.  Patient and her sister are not planning to continue her weekly Rituxan at this time Given a prescription of low-dose Seroquel 25 mg  p.o. at bedtime instead of the trazodone and Xanax.  Patient notes no fever no chills no night sweats no unexpected weight loss.  REVIEW OF SYSTEMS:   .10 Point review of Systems was done is negative except as noted above.   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 Past Surgical History:  Procedure Laterality Date   ABDOMINAL HYSTERECTOMY  1986   APPENDECTOMY  1967   BREAST EXCISIONAL BIOPSY Right 1984   CATARACT EXTRACTION W/ INTRAOCULAR LENS IMPLANT Right 1990's   congential cataract   CYSTOSCOPY W/ URETERAL STENT PLACEMENT  11/12/2015   Procedure: CYSTOSCOPY WITH RETROGRADE PYELOGRAM/URETERAL STENT PLACEMENT;  Surgeon: [name], MD;  Location: [facility];  Service: Urology;;   CYSTOSCOPY W/ URETERAL STENT REMOVAL Left 11/22/2015   Procedure: CYSTOSCOPY WITH STENT REMOVAL;  Surgeon: [name], MD;  Location: [facility];  Service: Urology;  Laterality: Left;   CYSTOSCOPY/URETEROSCOPY/HOLMIUM LASER Left 11/22/2015   Procedure: CYSTOSCOPY/URETEROSCOPY/HOLMIUM LASER;  Surgeon: [name], MD;  Location: [facility]  [name];  Service: Urology;  Laterality: Left;  FLEX URETEROSCOPY   FEMUR IM NAIL Left 08/04/2015   Procedure: INTRAMEDULLARY (IM) NAIL FEMORAL;  Surgeon: [name], MD;  Location: [place];  Service: Orthopedics;  Laterality: Left;   IR KYPHO THORACIC WITH BONE BIOPSY  06/22/2018   TONSILLECTOMY  age 22    . Social History   Tobacco Use   Smoking status: Former    Packs/day: 1.00    Types: Cigarettes    Quit date:  08/03/2015    Years since quitting: 6.1   Smokeless tobacco: Never  Vaping Use   Vaping Use: Never used  Substance Use Topics   Alcohol use: Yes    Comment: occ   Drug use: Never    ALLERGIES:  is allergic to other and shellfish allergy.  MEDICATIONS:  Current Outpatient Medications  Medication Sig Dispense Refill   QUEtiapine (SEROQUEL) 25 MG tablet Take 1 tablet (25 mg total) by mouth at bedtime. 30 tablet 0   acetaminophen (TYLENOL) 500 MG tablet Take 500 mg by mouth every 6 (six) hours as needed for moderate pain.     vitamin B-12 2000 MCG tablet Take 1 tablet (2,000 mcg total) by mouth daily. 30 tablet 2   No current facility-administered medications for this visit.    PHYSICAL EXAMINATION: ECOG PERFORMANCE STATUS: 2 Vital signs reviewed Exam was given in a chair. Marland Kitchen GENERAL:alert, in no acute distress and comfortable SKIN: no acute rashes, no significant lesions EYES: conjunctiva are pink and non-injected, sclera anicteric OROPHARYNX: MMM, no exudates, no oropharyngeal erythema or ulceration NECK: supple, no JVD LYMPH:  no palpable lymphadenopathy in the cervical, axillary or inguinal regions LUNGS: clear to auscultation b/l with normal respiratory effort HEART: regular rate & rhythm ABDOMEN:  normoactive bowel sounds , non tender, not distended. Extremity: no pedal edema PSYCH: alert & oriented x 3 with fluent speech NEURO: no focal motor/sensory deficits    LABORATORY DATA:   I have reviewed the data as listed . CBC Latest Ref Rng & Units 09/26/2021 09/20/2021 09/12/2021  WBC 4.0 - 10.5 K/uL 14.2(H) 7.2 10.7(H)  Hemoglobin 12.0 - 15.0 g/dL 8.5(L) 8.1(L) 6.9(LL)  Hematocrit 36.0 - 46.0 % 24.3(L) 24.6(L) 20.8(L)  Platelets 150 - 400 K/uL 733(H) 557(H) 624(H)    . CMP Latest Ref Rng & Units 09/26/2021 09/20/2021 09/12/2021  Glucose 70 - 99 mg/dL 105(H) 106(H) 88  BUN 8 - 23 mg/dL 8 11 9   Creatinine 0.44 - 1.00 mg/dL 0.57 0.57 0.51  Sodium 135 - 145 mmol/L  140 142 141  Potassium 3.5 - 5.1 mmol/L 3.9 3.2(L) 3.5  Chloride 98 - 111 mmol/L 96(L) 102 103  CO2 22 - 32 mmol/L 36(H) 34(H) 29  Calcium 8.9 - 10.3 mg/dL 8.3(L) 8.3(L) 8.2(L)  Total Protein 6.5 - 8.1 g/dL 6.5 6.0(L) 6.1(L)  Total Bilirubin 0.3 - 1.2 mg/dL 2.0(H) 2.2(H) 3.0(H)  Alkaline Phos 38 - 126 U/L 130(H) 111 130(H)  AST 15 - 41 U/L 16 12(L) 17  ALT 0 - 44 U/L 8 7 10            RADIOGRAPHIC STUDIES: I have personally reviewed the radiological images as listed and agreed with the findings in the report. No results found.  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 Her hemoglobin tends to be lower during winter and has been in the 6-8 range. Overall stable hemoglobin levels in the 6-7 range with folic acid replacement and cold  avoidance.  #2 IgM lambda MGUS - cannot rule out possibility of lymphoplasmacytic lymphoma though less likely given chronicity of her condition. Flow cytometry- no clonoal B cell lymphoma   #3 History of previous B12 deficiency  #4 hypokalemia- resolved. -Potassium, begin taking BID with orange juice, and pt completed her prednisone course so I expect her potassium levels to increase  -08/28/18 Potassium at 3.6  #5 Low back pain -compression fracture L4 -MRI L spine -Acute/subacute superior endplate fracture at L4 with loss of height of 10-20%. No retropulsed bone. This is quite likely the cause of the low back pain. This was probably present on the radiographs of 01/20/2018, but not diagnosable because of the spinal curvature and unlevel endplates. Old healed superior endplate fracture at O53 Plan -controlled discomfort -prn Tramadol for back pain   #6 Spiculated Lung nodule in right upper lobe  First seen on 05/28/18 CTA Chest Hasn't changed over 6 month interval with repeat CT Chest in February 2020  10/26/18 CT Chest which revealed There is a spiculated solid nodule of the right upper lobe, measuring 5-6 mm and  stable from prior examination. Recommend additional follow-up in 12 months to ensure ongoing stability. 2.  Emphysema. 3. Increased height loss deformities of T11 and T12.  08/13/2019 CT Chest ([id number]) revealed "4 mm posterior right upper lobe pulmonary nodule, unchanged. Dedicated follow-up imaging is not required per Fleischner Society guidelines. This recommendation follows the consensus statement Guidelines for Management of Small Pulmonary Nodules Detected on CT Images: From the Fleischner Society 2017; Radiology 2017; 284:228-243. 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 ([place]) 02/04/2018   Occult blood in stools 02/04/2018   Ureteral calculus 11/12/2015   Breast cancer screening, high risk patient 08/29/2015   Acute delirium 08/07/2015   Postoperative anemia due to acute blood loss 08/06/2015   Protein-calorie malnutrition, severe 08/04/2015   Closed intertrochanteric fracture of left femur ([place]) 08/03/2015   Fall 08/03/2015   Closed right hip fracture ([place]) 08/03/2015   Hyperbilirubinemia 08/01/2013   Hypokalemia 07/30/2013   Vitamin B 12 deficiency 08/07/2012   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   PLAN: -Patient's labs were  discussed with her in detail today. -No indication for PRBC transfusion. -Tolerated first dose of Rituxan without any hypersensitivity reactions or other prohibitive toxicities. -Patient appropriate to proceed with second cycle of Rituxan today. -About halfway through her Rituxan infusion patient was less responsive likely due to reduced sleep having not slept for 4 days.  Did eventually wake up and was back to her baseline. -She is having pretty severe progressive dementia with wandering and insomnia.  High risk for falls.  Had a long goals of care discussion again with the patient's sister at bedside.  Patient is not safe to function at home by herself and needs  24/7 supervision due to confusion and wandering and risk of falls in the context of significant dementia.  I have already given with our social workers contact information to the patient's sister to help with placement. -I also encouraged the patient sister to get in touch with the patient's primary care physician for their input regarding managing this.  She might potentially need to be on dementia medications. -We were unable to finish her dose of Rituxan today due to altered mental status. -Continue B12, B complex and vitamin D replacement. -given dementia and tendency to delirium and other anemia symptoms would need PRBC transfusion for hgb<7 at this time.  FOLLOW UP: Return to clinic for third weekly dose of Rituxan with labs and MD visit   . The total time spent in the appointment was 36 minutes on lab review, evaluation and management of Rituxan, evaluation of altered mental status, goals of care discussion.   All of the patient's questions were answered with apparent satisfaction. The patient knows to call the clinic with any problems, questions or concerns.   Sullivan Lone MD [place] AAHIVMS [facility] Hematology/Oncology Physician [facility]

## 2021-09-27 ENCOUNTER — Other Ambulatory Visit: Payer: Self-pay | Admitting: Hematology

## 2021-09-27 ENCOUNTER — Inpatient Hospital Stay: Payer: Medicare Other | Admitting: Hematology

## 2021-09-27 ENCOUNTER — Inpatient Hospital Stay: Payer: Medicare Other

## 2021-09-27 VITALS — BP 122/46 | HR 72 | Temp 98.8°F | Resp 16 | Wt 92.2 lb

## 2021-09-27 DIAGNOSIS — Z5112 Encounter for antineoplastic immunotherapy: Secondary | ICD-10-CM

## 2021-09-27 DIAGNOSIS — D5912 Cold autoimmune hemolytic anemia: Secondary | ICD-10-CM

## 2021-09-27 MED ORDER — LORATADINE 10 MG PO TABS
10.0000 mg | ORAL_TABLET | Freq: Once | ORAL | Status: AC
  Administered 2021-09-27: 10 mg via ORAL
  Filled 2021-09-27: qty 1

## 2021-09-27 MED ORDER — FAMOTIDINE 20 MG IN NS 100 ML IVPB
20.0000 mg | Freq: Once | INTRAVENOUS | Status: AC
  Administered 2021-09-27: 20 mg via INTRAVENOUS
  Filled 2021-09-27: qty 100

## 2021-09-27 MED ORDER — METHYLPREDNISOLONE SODIUM SUCC 125 MG IJ SOLR
80.0000 mg | Freq: Once | INTRAMUSCULAR | Status: AC
  Administered 2021-09-27: 80 mg via INTRAVENOUS
  Filled 2021-09-27: qty 2

## 2021-09-27 MED ORDER — SODIUM CHLORIDE 0.9 % IV SOLN: Freq: Once | INTRAVENOUS | Status: AC

## 2021-09-27 MED ORDER — ACETAMINOPHEN 325 MG PO TABS
650.0000 mg | ORAL_TABLET | Freq: Once | ORAL | Status: AC
  Administered 2021-09-27: 650 mg via ORAL
  Filled 2021-09-27: qty 2

## 2021-09-27 MED ORDER — SODIUM CHLORIDE 0.9 % IV SOLN
375.0000 mg/m2 | Freq: Once | INTRAVENOUS | Status: AC
  Administered 2021-09-27: 500 mg via INTRAVENOUS
  Filled 2021-09-27: qty 50

## 2021-09-27 NOTE — Patient Instructions (Signed)
[facility] MEDICAL ONCOLOGY   Discharge Instructions: Thank you for choosing [facility] to provide your oncology and hematology care.   If you have a lab appointment with the Cancer Center, please go directly to the Cancer Center and check in at the registration area.   Wear comfortable clothing and clothing appropriate for easy access to any Portacath or PICC line.   We strive to give you quality time with your provider. You may need to reschedule your appointment if you arrive late (15 or more minutes).  Arriving late affects you and other patients whose appointments are after yours.  Also, if you miss three or more appointments without notifying the office, you may be dismissed from the clinic at the provider's discretion.      For prescription refill requests, have your pharmacy contact our office and allow 72 hours for refills to be completed.    Today you received the following chemotherapy and/or immunotherapy agents: Rituximab (Rituxan)      To help prevent nausea and vomiting after your treatment, we encourage you to take your nausea medication as directed.  BELOW ARE SYMPTOMS THAT SHOULD BE REPORTED IMMEDIATELY: *FEVER GREATER THAN 100.4 F (38 C) OR HIGHER *CHILLS OR SWEATING *NAUSEA AND VOMITING THAT IS NOT CONTROLLED WITH YOUR NAUSEA MEDICATION *UNUSUAL SHORTNESS OF BREATH *UNUSUAL BRUISING OR BLEEDING *URINARY PROBLEMS (pain or burning when urinating, or frequent urination) *BOWEL PROBLEMS (unusual diarrhea, constipation, pain near the anus) TENDERNESS IN MOUTH AND THROAT WITH OR WITHOUT PRESENCE OF ULCERS (sore throat, sores in mouth, or a toothache) UNUSUAL RASH, SWELLING OR PAIN  UNUSUAL VAGINAL DISCHARGE OR ITCHING   Items with * indicate a potential emergency and should be followed up as soon as possible or go to the Emergency Department if any problems should occur.  Please show the CHEMOTHERAPY ALERT CARD or IMMUNOTHERAPY ALERT CARD at  check-in to the Emergency Department and triage nurse.  Should you have questions after your visit or need to cancel or reschedule your appointment, please contact [facility] MEDICAL ONCOLOGY  Dept: [phone]  and follow the prompts.  Office hours are 8:00 a.m. to 4:30 p.m. Monday - Friday. Please note that voicemails left after 4:00 p.m. may not be returned until the following business day.  We are closed weekends and major holidays. You have access to a nurse at all times for urgent questions. Please call the main number to the clinic Dept: [phone] and follow the prompts.   For any non-urgent questions, you may also contact your provider using MyChart. We now offer e-Visits for anyone 18 and older to request care online for non-urgent symptoms. For details visit [email or web address].   Also download the MyChart app! Go to the app store, search "MyChart", open the app, select [facility], and log in with your MyChart username and password.  Due to Covid, a mask is required upon entering the hospital/clinic. If you do not have a mask, one will be given to you upon arrival. For doctor visits, patients may have 1 support person aged 18 or older with them. For treatment visits, patients cannot have anyone with them due to current Covid guidelines and our immunocompromised population.  

## 2021-10-02 ENCOUNTER — Inpatient Hospital Stay (HOSPITAL_COMMUNITY): Payer: Medicare Other

## 2021-10-02 ENCOUNTER — Other Ambulatory Visit: Payer: Self-pay | Admitting: Hematology

## 2021-10-02 ENCOUNTER — Emergency Department (HOSPITAL_COMMUNITY): Payer: Medicare Other

## 2021-10-02 ENCOUNTER — Encounter (HOSPITAL_COMMUNITY): Payer: Self-pay

## 2021-10-02 ENCOUNTER — Other Ambulatory Visit: Payer: Self-pay

## 2021-10-02 ENCOUNTER — Telehealth: Payer: Self-pay | Admitting: Hematology

## 2021-10-02 ENCOUNTER — Inpatient Hospital Stay (HOSPITAL_COMMUNITY)
Admission: EM | Admit: 2021-10-02 | Discharge: 2021-10-09 | DRG: 480 | Disposition: A | Discharge status: Skilled Nursing Facility | Payer: Medicare Other | Attending: Internal Medicine | Admitting: Internal Medicine

## 2021-10-02 DIAGNOSIS — F0390 Unspecified dementia without behavioral disturbance: Secondary | ICD-10-CM | POA: Diagnosis present

## 2021-10-02 DIAGNOSIS — Z66 Do not resuscitate: Secondary | ICD-10-CM | POA: Diagnosis present

## 2021-10-02 DIAGNOSIS — G4700 Insomnia, unspecified: Secondary | ICD-10-CM

## 2021-10-02 DIAGNOSIS — S064X0A Epidural hemorrhage without loss of consciousness, initial encounter: Secondary | ICD-10-CM | POA: Diagnosis present

## 2021-10-02 DIAGNOSIS — W1830XA Fall on same level, unspecified, initial encounter: Secondary | ICD-10-CM | POA: Diagnosis present

## 2021-10-02 DIAGNOSIS — D72828 Other elevated white blood cell count: Secondary | ICD-10-CM | POA: Diagnosis present

## 2021-10-02 DIAGNOSIS — S72001A Fracture of unspecified part of neck of right femur, initial encounter for closed fracture: Secondary | ICD-10-CM | POA: Diagnosis present

## 2021-10-02 DIAGNOSIS — D5912 Cold autoimmune hemolytic anemia: Secondary | ICD-10-CM

## 2021-10-02 DIAGNOSIS — Z87891 Personal history of nicotine dependence: Secondary | ICD-10-CM | POA: Diagnosis not present

## 2021-10-02 DIAGNOSIS — S2232XA Fracture of one rib, left side, initial encounter for closed fracture: Secondary | ICD-10-CM | POA: Diagnosis present

## 2021-10-02 DIAGNOSIS — W19XXXA Unspecified fall, initial encounter: Secondary | ICD-10-CM | POA: Diagnosis present

## 2021-10-02 DIAGNOSIS — D649 Anemia, unspecified: Secondary | ICD-10-CM | POA: Diagnosis present

## 2021-10-02 DIAGNOSIS — Y92009 Unspecified place in unspecified non-institutional (private) residence as the place of occurrence of the external cause: Secondary | ICD-10-CM | POA: Diagnosis present

## 2021-10-02 DIAGNOSIS — J449 Chronic obstructive pulmonary disease, unspecified: Secondary | ICD-10-CM | POA: Diagnosis present

## 2021-10-02 DIAGNOSIS — Z09 Encounter for follow-up examination after completed treatment for conditions other than malignant neoplasm: Secondary | ICD-10-CM

## 2021-10-02 DIAGNOSIS — Z90710 Acquired absence of both cervix and uterus: Secondary | ICD-10-CM | POA: Diagnosis not present

## 2021-10-02 DIAGNOSIS — E538 Deficiency of other specified B group vitamins: Secondary | ICD-10-CM | POA: Diagnosis present

## 2021-10-02 DIAGNOSIS — I615 Nontraumatic intracerebral hemorrhage, intraventricular: Secondary | ICD-10-CM

## 2021-10-02 DIAGNOSIS — Y92008 Other place in unspecified non-institutional (private) residence as the place of occurrence of the external cause: Secondary | ICD-10-CM | POA: Diagnosis not present

## 2021-10-02 DIAGNOSIS — M80851A Other osteoporosis with current pathological fracture, right femur, initial encounter for fracture: Principal | ICD-10-CM | POA: Diagnosis present

## 2021-10-02 DIAGNOSIS — D500 Iron deficiency anemia secondary to blood loss (chronic): Secondary | ICD-10-CM | POA: Diagnosis present

## 2021-10-02 DIAGNOSIS — Z20822 Contact with and (suspected) exposure to covid-19: Secondary | ICD-10-CM | POA: Diagnosis present

## 2021-10-02 DIAGNOSIS — S72009A Fracture of unspecified part of neck of unspecified femur, initial encounter for closed fracture: Secondary | ICD-10-CM | POA: Insufficient documentation

## 2021-10-02 DIAGNOSIS — S72142A Displaced intertrochanteric fracture of left femur, initial encounter for closed fracture: Secondary | ICD-10-CM | POA: Diagnosis not present

## 2021-10-02 DIAGNOSIS — J410 Simple chronic bronchitis: Secondary | ICD-10-CM | POA: Diagnosis not present

## 2021-10-02 DIAGNOSIS — R531 Weakness: Secondary | ICD-10-CM | POA: Diagnosis present

## 2021-10-02 DIAGNOSIS — S72001S Fracture of unspecified part of neck of right femur, sequela: Secondary | ICD-10-CM | POA: Diagnosis not present

## 2021-10-02 DIAGNOSIS — D591 Autoimmune hemolytic anemia, unspecified: Secondary | ICD-10-CM | POA: Diagnosis present

## 2021-10-02 DIAGNOSIS — Z803 Family history of malignant neoplasm of breast: Secondary | ICD-10-CM | POA: Diagnosis not present

## 2021-10-02 DIAGNOSIS — Z419 Encounter for procedure for purposes other than remedying health state, unspecified: Secondary | ICD-10-CM

## 2021-10-02 DIAGNOSIS — F05 Delirium due to known physiological condition: Secondary | ICD-10-CM | POA: Diagnosis not present

## 2021-10-02 DIAGNOSIS — Z79899 Other long term (current) drug therapy: Secondary | ICD-10-CM | POA: Diagnosis not present

## 2021-10-02 DIAGNOSIS — R0602 Shortness of breath: Secondary | ICD-10-CM

## 2021-10-02 LAB — COMPREHENSIVE METABOLIC PANEL
ALT: 21 U/L (ref 0–44)
AST: 29 U/L (ref 15–41)
Albumin: 3.1 g/dL — ABNORMAL LOW (ref 3.5–5.0)
Alkaline Phosphatase: 150 U/L — ABNORMAL HIGH (ref 38–126)
Anion gap: 9 (ref 5–15)
BUN: 15 mg/dL (ref 8–23)
CO2: 32 mmol/L (ref 22–32)
Calcium: 7.8 mg/dL — ABNORMAL LOW (ref 8.9–10.3)
Chloride: 94 mmol/L — ABNORMAL LOW (ref 98–111)
Creatinine, Ser: 0.79 mg/dL (ref 0.44–1.00)
GFR, Estimated: >60 mL/min (ref >60)
Glucose, Bld: 107 mg/dL — ABNORMAL HIGH (ref 70–99)
Potassium: 4.6 mmol/L (ref 3.5–5.1)
Sodium: 135 mmol/L (ref 135–145)
Total Bilirubin: 3.1 mg/dL — ABNORMAL HIGH (ref 0.3–1.2)
Total Protein: 6.2 g/dL — ABNORMAL LOW (ref 6.5–8.1)

## 2021-10-02 LAB — CBC WITH DIFFERENTIAL/PLATELET
Abs Immature Granulocytes: 0.29 10*3/uL — ABNORMAL HIGH (ref 0.00–0.07)
Basophils Absolute: 0.1 10*3/uL (ref 0.0–0.1)
Basophils Relative: 1 %
Eosinophils Absolute: 0.1 10*3/uL (ref 0.0–0.5)
Eosinophils Relative: 0 %
HCT: 20.9 % — ABNORMAL LOW (ref 36.0–46.0)
Hemoglobin: 7.3 g/dL — ABNORMAL LOW (ref 12.0–15.0)
Immature Granulocytes: 2 %
Lymphocytes Relative: 13 %
Lymphs Abs: 2.3 10*3/uL (ref 0.7–4.0)
MCH: 33.5 pg (ref 26.0–34.0)
MCHC: 34.9 g/dL (ref 30.0–36.0)
MCV: 95.9 fL (ref 80.0–100.0)
Monocytes Absolute: 0.8 10*3/uL (ref 0.1–1.0)
Monocytes Relative: 4 %
Neutro Abs: 14.6 10*3/uL — ABNORMAL HIGH (ref 1.7–7.7)
Neutrophils Relative %: 80 %
Platelets: 659 10*3/uL — ABNORMAL HIGH (ref 150–400)
RBC: 2.18 MIL/uL — ABNORMAL LOW (ref 3.87–5.11)
RDW: 18.4 % — ABNORMAL HIGH (ref 11.5–15.5)
Smear Review: NORMAL
WBC: 18.7 10*3/uL — ABNORMAL HIGH (ref 4.0–10.5)
nRBC: 0.3 % — ABNORMAL HIGH (ref 0.0–0.2)

## 2021-10-02 LAB — RESP PANEL BY RT-PCR (FLU A&B, COVID) ARPGX2
Influenza A by PCR: NEGATIVE
Influenza B by PCR: NEGATIVE
SARS Coronavirus 2 by RT PCR: NEGATIVE

## 2021-10-02 LAB — TROPONIN I (HIGH SENSITIVITY): Troponin I (High Sensitivity): 8 ng/L (ref <18)

## 2021-10-02 MED ORDER — VITAMIN B-12 1000 MCG PO TABS
2000.0000 ug | ORAL_TABLET | Freq: Every day | ORAL | Status: DC
  Administered 2021-10-03 – 2021-10-09 (×7): 2000 ug via ORAL
  Filled 2021-10-02 (×7): qty 2

## 2021-10-02 MED ORDER — HALOPERIDOL LACTATE 5 MG/ML IJ SOLN
2.0000 mg | Freq: Once | INTRAMUSCULAR | Status: AC
Start: 2021-10-02 — End: 2021-10-02
  Administered 2021-10-02: 2 mg via INTRAVENOUS
  Filled 2021-10-02: qty 1

## 2021-10-02 MED ORDER — MORPHINE SULFATE (PF) 2 MG/ML IV SOLN
0.5000 mg | INTRAVENOUS | Status: DC | PRN
  Administered 2021-10-03 – 2021-10-04 (×7): 0.5 mg via INTRAVENOUS
  Filled 2021-10-02 (×7): qty 1

## 2021-10-02 MED ORDER — QUETIAPINE FUMARATE 25 MG PO TABS
25.0000 mg | ORAL_TABLET | Freq: Every day | ORAL | Status: DC
  Filled 2021-10-02: qty 1

## 2021-10-02 NOTE — Telephone Encounter (Signed)
Sch per 1/9 inbasket, pt aware °

## 2021-10-02 NOTE — H&P (Addendum)
History and Physical    TEMPLE EWART HWE:[id number] DOB: 1941/01/27 DOA: 10/02/2021  PCP: [name], MD  Patient coming from: Home.  Chief Complaint: Right hip pain and chest pain.  After fall.  HPI: [name] is a 81 y.o. female with history of dementia and cold agglutinin related chronic hemolytic anemia being followed by oncologist was brought to the ER after patient was complaining of pain in the right hip area and also left-sided chest pain after a fall about 48 hours ago at home.  The exact circumstances around the fall is not clear since was trying to reach patient's sister with whom patient lives and was unable to reach.  ED Course: In the ER patient is alert and awake oriented to name only.  X-rays revealed right hip fracture and left-sided ninth rib fracture.  Labs show leukocytosis with hemoglobin at baseline and troponins are negative EKG nonspecific findings.  CT head is pending.  On-call orthopedic surgeon Dr. Lucia Gaskins was consulted.  COVID test was negative.  Review of Systems: As per HPI, rest all negative.   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[place])    montiored by dr Marko Plume   Renal and ureteric calculus    left    Past Surgical History:  Procedure Laterality Date   ABDOMINAL HYSTERECTOMY  1986   APPENDECTOMY  1967   BREAST EXCISIONAL BIOPSY Right 1984   CATARACT EXTRACTION W/ INTRAOCULAR LENS IMPLANT Right 1990's   congential cataract   CYSTOSCOPY W/ URETERAL STENT PLACEMENT  11/12/2015   Procedure: CYSTOSCOPY WITH RETROGRADE PYELOGRAM/URETERAL STENT PLACEMENT;  Surgeon: [name], MD;   Location: [facility];  Service: Urology;;   CYSTOSCOPY W/ URETERAL STENT REMOVAL Left 11/22/2015   Procedure: CYSTOSCOPY WITH STENT REMOVAL;  Surgeon: [name], MD;  Location: [facility];  Service: Urology;  Laterality: Left;   CYSTOSCOPY/URETEROSCOPY/HOLMIUM LASER Left 11/22/2015   Procedure: CYSTOSCOPY/URETEROSCOPY/HOLMIUM LASER;  Surgeon: [name], MD;  Location: [facility];  Service: Urology;  Laterality: Left;  FLEX URETEROSCOPY   FEMUR IM NAIL Left 08/04/2015   Procedure: INTRAMEDULLARY (IM) NAIL FEMORAL;  Surgeon: [name], MD;  Location: [place];  Service: Orthopedics;  Laterality: Left;   IR KYPHO THORACIC WITH BONE BIOPSY  06/22/2018   TONSILLECTOMY  age 20     reports that she quit smoking about 6 years ago. Her smoking use included cigarettes. She smoked an average of 1 pack per day. She has never used smokeless tobacco. She reports current alcohol use. She reports that she does not use drugs.  Allergies  Allergen Reactions   Other Other (See Comments)    "Most antibiotics give yeast infection"   Shellfish Allergy Hives    Family History  Problem Relation Age of Onset   Breast cancer Cousin     Prior to Admission medications   Medication Sig Start Date End Date Taking? Authorizing Provider  acetaminophen (TYLENOL) 500 MG tablet Take 500 mg by mouth every 6 (six) hours as needed for moderate pain.    [provider]  QUEtiapine (SEROQUEL)  25 MG tablet Take 1 tablet (25 mg total) by mouth at bedtime. 09/20/21   Brunetta Genera, MD  vitamin B-12 2000 MCG tablet Take 1 tablet (2,000 mcg total) by mouth daily. 08/21/21   [name], MD    Physical Exam: Constitutional: Moderately built and nourished. Vitals:   10/02/21 1845 10/02/21 1900 10/02/21 2045 10/02/21 2145  BP: (!) 131/59 (!) 133/53 (!) 131/53 133/85  Pulse: 78 80 83 99  Resp:  16 (!) 23 (!) 26  Temp:      TempSrc:      SpO2: 95% 94% 92% 94%  Weight:       Height:       Eyes: Anicteric no pallor. ENMT: No discharge from the ears eyes nose and mouth. Neck: No mass felt.  No neck rigidity. Respiratory: No rhonchi or crepitations. Cardiovascular: S1-S2 heard. Abdomen: Soft nontender bowel sound present. Musculoskeletal: Pain on moving right hip and less tenderness of the left side of the chest around the rib area. Skin: Chronic skin changes. Neurologic: Alert awake oriented to her name.  Moving all extremities. Psychiatric: Oriented to her name.   Labs on Admission: I have personally reviewed following labs and imaging studies  CBC: Recent Labs  Lab 09/26/21 0918 10/02/21 1844  WBC 14.2* 18.7*  NEUTROABS 10.7* 14.6*  HGB 8.5* 7.3*  HCT 24.3* 20.9*  MCV 99.6 95.9  PLT 733* 992*   Basic Metabolic Panel: Recent Labs  Lab 09/26/21 0918 10/02/21 1844  NA 140 135  K 3.9 4.6  CL 96* 94*  CO2 36* 32  GLUCOSE 105* 107*  BUN 8 15  CREATININE 0.57 0.79  CALCIUM 8.3* 7.8*   GFR: Estimated Creatinine Clearance: 36.9 mL/min (by C-G formula based on SCr of 0.79 mg/dL). Liver Function Tests: Recent Labs  Lab 09/26/21 0918 10/02/21 1844  AST 16 29  ALT 8 21  ALKPHOS 130* 150*  BILITOT 2.0* 3.1*  PROT 6.5 6.2*  ALBUMIN 3.8 3.1*   No results for input(s): LIPASE, AMYLASE in the last 168 hours. No results for input(s): AMMONIA in the last 168 hours. Coagulation Profile: No results for input(s): INR, PROTIME in the last 168 hours. Cardiac Enzymes: No results for input(s): CKTOTAL, CKMB, CKMBINDEX, TROPONINI in the last 168 hours. BNP (last 3 results) No results for input(s): PROBNP in the last 8760 hours. HbA1C: No results for input(s): HGBA1C in the last 72 hours. CBG: No results for input(s): GLUCAP in the last 168 hours. Lipid Profile: No results for input(s): CHOL, HDL, LDLCALC, TRIG, CHOLHDL, LDLDIRECT in the last 72 hours. Thyroid Function Tests: No results for input(s): TSH, T4TOTAL, FREET4, T3FREE, THYROIDAB in  the last 72 hours. Anemia Panel: No results for input(s): VITAMINB12, FOLATE, FERRITIN, TIBC, IRON, RETICCTPCT in the last 72 hours. Urine analysis:    Component Value Date/Time   COLORURINE YELLOW 08/09/2021 [place] 08/09/2021 1246   LABSPEC >1.046 (H) 08/09/2021 1246   PHURINE 5.0 08/09/2021 1246   GLUCOSEU NEGATIVE 08/09/2021 1246   HGBUR SMALL (A) 08/09/2021 1246   BILIRUBINUR NEGATIVE 08/09/2021 1246   KETONESUR 5 (A) 08/09/2021 1246   PROTEINUR NEGATIVE 08/09/2021 1246   NITRITE NEGATIVE 08/09/2021 1246   LEUKOCYTESUR NEGATIVE 08/09/2021 1246   Sepsis Labs: @LABRCNTIP (procalcitonin:4,lacticidven:4) )No results found for this or any previous visit (from the past 240 hour(s)).   Radiological Exams on Admission: DG Ribs Unilateral W/Chest Left  Result Date: 10/02/2021 CLINICAL DATA:  Fall, chest pain. EXAM: LEFT RIBS AND CHEST -  3+ VIEW COMPARISON:  Chest x-ray 08/10/2021. FINDINGS: Cardiomediastinal silhouette is stable, the heart is mildly enlarged. There are minimal patchy opacities in the left costophrenic angle. There may be a small left pleural effusion. There is no evidence for pneumothorax. There are atherosclerotic calcifications of the aorta. There is an acute left lateral ninth rib fracture. IMPRESSION: 1. Acute fracture left ninth rib. 2. Minimal left basilar atelectasis/airspace disease with questionable small left pleural effusion. Electronically Signed   By: Ronney Asters M.D.   On: 10/02/2021 20:26   DG Knee Complete 4 Views Right  Result Date: 10/02/2021 CLINICAL DATA:  Fall. EXAM: RIGHT KNEE - COMPLETE 4+ VIEW COMPARISON:  None. FINDINGS: There is no acute fracture or dislocation of the right knee. The bones are osteopenic. There is moderate arthritic changes. No significant joint effusion. There is diffuse subcutaneous edema. IMPRESSION: No acute fracture or dislocation of the right knee. Electronically Signed   By: Anner Crete M.D.   On:  10/02/2021 20:27   DG Hip Unilat W or Wo Pelvis 2-3 Views Right  Result Date: 10/02/2021 CLINICAL DATA:  Fall. EXAM: DG HIP (WITH OR WITHOUT PELVIS) 2-3V RIGHT COMPARISON:  None. FINDINGS: The bones are osteopenic. There is an acute comminuted right femoral intratrochanteric fracture with superolateral dislocation of the distal fracture fragment. There is no evidence for dislocation. Left-sided hip screw is partially visualized and appears uncomplicated. IMPRESSION: 1. Displaced comminuted right femoral intratrochanteric fracture. Electronically Signed   By: Ronney Asters M.D.   On: 10/02/2021 20:27    EKG: Independently reviewed.  Normal sinus rhythm LVH.  Assessment/Plan Principal Problem:   Hip fracture (HCC) Active Problems:   Closed right hip fracture (HCC)   Autoimmune hemolytic anemia (HCC)   COPD (chronic obstructive pulmonary disease) (HCC)    Right hip fracture and left-sided ninth rib fracture status post fall for which Dr. Lucia Gaskins orthopedic surgeon has been consulted.  We will follow the recommendations.  Pain medications.  CT of the head is pending. Leukocytosis with a rib series showing infiltrates we will get a formal chest x-ray and UA for now we will keep patient on doxycycline.  Follow CBC. History of cold agglutinin related to chronic hemolytic anemia.  Hemoglobin appears to be at baseline.  Follow CBC.   History of dementia.  No acute issues at this time closely monitor for any changes. Thrombocytosis follow CBC.  Since patient has right hip fracture will need close monitoring and may need further procedures and inpatient status.   Addendum -CT of the head showed a small intraventricular hemorrhage.  Discussed with Dr. Venetia Constable neurosurgery.  Neurosurgery at this time feels no intervention required.  Observation.   DVT prophylaxis: SCDs.  Avoiding anticoagulation with possible need for surgery. Code Status: DNR. Family Communication: Unable to reach patient's  sister with whom patient lives. Disposition Plan: May need rehab. Consults called: Orthopedics and neurosurgery. Admission status: Inpatient.   Rise Patience MD [facility] Pager [phone].  If 7PM-7AM, please contact night-coverage [email or web address] Password [email or web address]  10/02/2021, 10:18 PM

## 2021-10-02 NOTE — Plan of Care (Signed)

## 2021-10-02 NOTE — ED Provider Notes (Signed)
[facility] EMERGENCY DEPARTMENT Provider Note   CSN: [id number] Arrival date & time: 10/02/21  1750     History  Chief Complaint  Patient presents with   Chest Pain    [name] is a 81 y.o. female.   Chest Pain Associated symptoms: no shortness of breath and no weakness   Patient presents after a fall.  Reportedly fell.  Has had dull chest pain since it.  On the left side of chest.  Worse with certain movements.  Reportedly fell on Sunday.  Right knee pain right hip pain.  Swelling of both lower legs.  Does have history of some dementia.  Able to answer questions for me.    Home Medications Prior to Admission medications   Medication Sig Start Date End Date Taking? Authorizing Provider  acetaminophen (TYLENOL) 500 MG tablet Take 500 mg by mouth every 6 (six) hours as needed for moderate pain.    [provider]  QUEtiapine (SEROQUEL) 25 MG tablet Take 1 tablet (25 mg total) by mouth at bedtime. 09/20/21   Brunetta Genera, MD  vitamin B-12 2000 MCG tablet Take 1 tablet (2,000 mcg total) by mouth daily. 08/21/21   [name], MD      Allergies    Other and Shellfish allergy    Review of Systems   Review of Systems  Constitutional:  Negative for appetite change.  HENT:  Negative for congestion.   Respiratory:  Negative for shortness of breath.   Cardiovascular:  Positive for chest pain and leg swelling.  Musculoskeletal:        Right knee and right hip pain.  Neurological:  Negative for weakness.  Psychiatric/Behavioral:  Negative for confusion.    Physical Exam Updated Vital Signs BP (!) 110/47 (BP Location: Left Arm)    Pulse 80    Temp 98.4 F (36.9 C) (Oral)    Resp 20    Ht 5\' 1"  (1.549 m)    Wt 41.7 kg    SpO2 90%    BMI 17.38 kg/m  Physical Exam Vitals reviewed.  Eyes:     Pupils: Pupils are equal, round, and reactive to light.  Cardiovascular:     Rate and Rhythm: Normal rate.  Pulmonary:     Breath sounds:  No decreased breath sounds or wheezing.  Chest:     Chest wall: Tenderness present.     Comments: Tenderness to left lateral lower chest wall.  No ecchymosis. Musculoskeletal:     Comments: Edema bilateral extremities.  Tenderness to right hip and right knee.  Swelling of right right knee particularly.  Edema both legs.  Patient is pale.  Skin:    Coloration: Skin is pale.  Neurological:     Mental Status: She is alert.    ED Results / Procedures / Treatments   Labs (all labs ordered are listed, but only abnormal results are displayed) Labs Reviewed  COMPREHENSIVE METABOLIC PANEL - Abnormal; Notable for the following components:      Result Value   Chloride 94 (*)    Glucose, Bld 107 (*)    Calcium 7.8 (*)    Total Protein 6.2 (*)    Albumin 3.1 (*)    Alkaline Phosphatase 150 (*)    Total Bilirubin 3.1 (*)    All other components within normal limits  CBC WITH DIFFERENTIAL/PLATELET - Abnormal; Notable for the following components:   WBC 18.7 (*)    RBC 2.18 (*)    Hemoglobin  7.3 (*)    HCT 20.9 (*)    RDW 18.4 (*)    Platelets 659 (*)    nRBC 0.3 (*)    Neutro Abs 14.6 (*)    Abs Immature Granulocytes 0.29 (*)    All other components within normal limits  RESP PANEL BY RT-PCR (FLU A&B, COVID) ARPGX2  CBC  BASIC METABOLIC PANEL  TYPE AND SCREEN  TROPONIN I (HIGH SENSITIVITY)  TROPONIN I (HIGH SENSITIVITY)    EKG None  Radiology DG Ribs Unilateral W/Chest Left  Result Date: 10/02/2021 CLINICAL DATA:  Fall, chest pain. EXAM: LEFT RIBS AND CHEST - 3+ VIEW COMPARISON:  Chest x-ray 08/10/2021. FINDINGS: Cardiomediastinal silhouette is stable, the heart is mildly enlarged. There are minimal patchy opacities in the left costophrenic angle. There may be a small left pleural effusion. There is no evidence for pneumothorax. There are atherosclerotic calcifications of the aorta. There is an acute left lateral ninth rib fracture. IMPRESSION: 1. Acute fracture left ninth rib. 2.  Minimal left basilar atelectasis/airspace disease with questionable small left pleural effusion. Electronically Signed   By: Ronney Asters M.D.   On: 10/02/2021 20:26   DG Knee Complete 4 Views Right  Result Date: 10/02/2021 CLINICAL DATA:  Fall. EXAM: RIGHT KNEE - COMPLETE 4+ VIEW COMPARISON:  None. FINDINGS: There is no acute fracture or dislocation of the right knee. The bones are osteopenic. There is moderate arthritic changes. No significant joint effusion. There is diffuse subcutaneous edema. IMPRESSION: No acute fracture or dislocation of the right knee. Electronically Signed   By: Anner Crete M.D.   On: 10/02/2021 20:27   DG Hip Unilat W or Wo Pelvis 2-3 Views Right  Result Date: 10/02/2021 CLINICAL DATA:  Fall. EXAM: DG HIP (WITH OR WITHOUT PELVIS) 2-3V RIGHT COMPARISON:  None. FINDINGS: The bones are osteopenic. There is an acute comminuted right femoral intratrochanteric fracture with superolateral dislocation of the distal fracture fragment. There is no evidence for dislocation. Left-sided hip screw is partially visualized and appears uncomplicated. IMPRESSION: 1. Displaced comminuted right femoral intratrochanteric fracture. Electronically Signed   By: Ronney Asters M.D.   On: 10/02/2021 20:27    Procedures Procedures    Medications Ordered in ED Medications  vitamin B-12 (CYANOCOBALAMIN) tablet 2,000 mcg (has no administration in time range)  morphine 2 MG/ML injection 0.5 mg (has no administration in time range)  haloperidol lactate (HALDOL) injection 2 mg (2 mg Intravenous Given 10/02/21 2153)    ED Course/ Medical Decision Making/ A&P                           Medical Decision Making Problems Addressed: Anemia, unspecified type: chronic illness or injury that poses a threat to life or bodily functions Closed displaced intertrochanteric fracture of left femur, initial encounter [facility]): acute illness or injury that poses a threat to life or bodily functions Closed  fracture of one rib of left side, initial encounter: acute illness or injury  Amount and/or Complexity of Data Reviewed External Data Reviewed: notes. Labs:  Decision-making details documented in ED Course. Radiology: independent interpretation performed. Decision-making details documented in ED Course.   Patient presents after fall.  Reportedly fell a couple days ago.  Initially mental status is pretty good but then worsened.  Does have history of dementia and this appears to be more near her baseline.  Tenderness to hip and knee.  X-ray showed intertrochanteric fracture.  Discussed with Ortho, who will see  patient.  Also found to have left rib fracture.  CT scan head added after more confusion.  Can be followed by hospitalist.  Also has worsening anemia.  History of cold agglutinin hemolytic anemia.  Has required transfusions recently.  Admit to hospitalist.          Final Clinical Impression(s) / ED Diagnoses Final diagnoses:  Closed displaced intertrochanteric fracture of left femur, initial encounter ([place])  Anemia, unspecified type  Closed fracture of one rib of left side, initial encounter    Rx / DC Orders ED Discharge Orders     None         Davonna Belling, MD 10/02/21 2344

## 2021-10-02 NOTE — ED Triage Notes (Signed)
Pt BIBA from home. Pt c/o chest pain since this AM- dull pressure. Pain worse with movement. Pt had a fall Sunday. Pt c/o right leg pain. Pt has bilateral lower leg swelling, right knee particularly swollen.
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
## 2021-10-02 NOTE — ED Notes (Signed)
Received verbal report from Donnel Saxon RN at this time

## 2021-10-02 NOTE — ED Notes (Signed)
Per Dr. Hal Hope pt can go to the floor without telemetry

## 2021-10-02 NOTE — ED Notes (Signed)
Admit provider at bedside at this time. Pt has been stating that she hasn't fallen, that she hasn't seen a provider. She is also talking about insurance and paying for things.

## 2021-10-02 NOTE — ED Notes (Signed)
Attempted IV x2 unsuccessfully 

## 2021-10-02 NOTE — ED Notes (Signed)
Pt is sitting up in bed pulling the monitor and everything off of her. Attempting to get a safety sitter at this time

## 2021-10-03 ENCOUNTER — Inpatient Hospital Stay (HOSPITAL_COMMUNITY): Payer: Medicare Other

## 2021-10-03 ENCOUNTER — Encounter (HOSPITAL_COMMUNITY): Payer: Self-pay | Admitting: Internal Medicine

## 2021-10-03 ENCOUNTER — Inpatient Hospital Stay (HOSPITAL_COMMUNITY): Payer: Medicare Other | Admitting: Anesthesiology

## 2021-10-03 ENCOUNTER — Ambulatory Visit: Payer: Medicare Other

## 2021-10-03 ENCOUNTER — Other Ambulatory Visit: Payer: Medicare Other

## 2021-10-03 ENCOUNTER — Ambulatory Visit: Payer: Medicare Other | Admitting: Hematology

## 2021-10-03 ENCOUNTER — Other Ambulatory Visit: Payer: Self-pay

## 2021-10-03 ENCOUNTER — Encounter: Payer: Self-pay | Admitting: Hematology

## 2021-10-03 DIAGNOSIS — J410 Simple chronic bronchitis: Secondary | ICD-10-CM

## 2021-10-03 DIAGNOSIS — D649 Anemia, unspecified: Secondary | ICD-10-CM | POA: Diagnosis not present

## 2021-10-03 DIAGNOSIS — S72142A Displaced intertrochanteric fracture of left femur, initial encounter for closed fracture: Secondary | ICD-10-CM | POA: Diagnosis not present

## 2021-10-03 DIAGNOSIS — S72001A Fracture of unspecified part of neck of right femur, initial encounter for closed fracture: Secondary | ICD-10-CM | POA: Diagnosis not present

## 2021-10-03 DIAGNOSIS — S2232XA Fracture of one rib, left side, initial encounter for closed fracture: Secondary | ICD-10-CM | POA: Diagnosis not present

## 2021-10-03 DIAGNOSIS — F0390 Unspecified dementia without behavioral disturbance: Secondary | ICD-10-CM | POA: Diagnosis present

## 2021-10-03 DIAGNOSIS — I615 Nontraumatic intracerebral hemorrhage, intraventricular: Secondary | ICD-10-CM

## 2021-10-03 HISTORY — DX: Nontraumatic intracerebral hemorrhage, intraventricular: I61.5

## 2021-10-03 LAB — BASIC METABOLIC PANEL
Anion gap: 11 (ref 5–15)
BUN: 14 mg/dL (ref 8–23)
CO2: 28 mmol/L (ref 22–32)
Calcium: 7.7 mg/dL — ABNORMAL LOW (ref 8.9–10.3)
Chloride: 95 mmol/L — ABNORMAL LOW (ref 98–111)
Creatinine, Ser: 0.59 mg/dL (ref 0.44–1.00)
GFR, Estimated: >60 mL/min (ref >60)
Glucose, Bld: 117 mg/dL — ABNORMAL HIGH (ref 70–99)
Potassium: 4.8 mmol/L (ref 3.5–5.1)
Sodium: 134 mmol/L — ABNORMAL LOW (ref 135–145)

## 2021-10-03 LAB — CBC
HCT: 20.5 % — ABNORMAL LOW (ref 36.0–46.0)
Hemoglobin: 6.9 g/dL — CL (ref 12.0–15.0)
MCH: 33.5 pg (ref 26.0–34.0)
MCHC: 33.7 g/dL (ref 30.0–36.0)
MCV: 99.5 fL (ref 80.0–100.0)
Platelets: 568 10*3/uL — ABNORMAL HIGH (ref 150–400)
RBC: 2.06 MIL/uL — ABNORMAL LOW (ref 3.87–5.11)
RDW: 19.9 % — ABNORMAL HIGH (ref 11.5–15.5)
WBC: 18.4 10*3/uL — ABNORMAL HIGH (ref 4.0–10.5)
nRBC: 0.3 % — ABNORMAL HIGH (ref 0.0–0.2)

## 2021-10-03 LAB — TYPE AND SCREEN: Antibody Screen: NEGATIVE

## 2021-10-03 LAB — TROPONIN I (HIGH SENSITIVITY): Troponin I (High Sensitivity): 7 ng/L (ref <18)

## 2021-10-03 LAB — PREPARE RBC (CROSSMATCH)

## 2021-10-03 MED ORDER — SODIUM CHLORIDE 0.9% IV SOLUTION: Freq: Once | INTRAVENOUS | Status: AC

## 2021-10-03 MED ORDER — SODIUM CHLORIDE 0.9 % IV SOLN
100.0000 mg | Freq: Two times a day (BID) | INTRAVENOUS | Status: DC
  Administered 2021-10-03 – 2021-10-05 (×4): 100 mg via INTRAVENOUS
  Filled 2021-10-03 (×7): qty 100

## 2021-10-03 MED ORDER — FENTANYL CITRATE (PF) 100 MCG/2ML IJ SOLN
INTRAMUSCULAR | Status: AC
  Filled 2021-10-03: qty 2

## 2021-10-03 MED ORDER — ROPIVACAINE HCL 5 MG/ML IJ SOLN
INTRAMUSCULAR | Status: DC | PRN
  Administered 2021-10-03: 25 mL via PERINEURAL

## 2021-10-03 MED ORDER — ADULT MULTIVITAMIN W/MINERALS CH
1.0000 | ORAL_TABLET | Freq: Every day | ORAL | Status: DC
  Administered 2021-10-03 – 2021-10-09 (×7): 1 via ORAL
  Filled 2021-10-03 (×7): qty 1

## 2021-10-03 MED ORDER — FOLIC ACID 1 MG PO TABS
1.0000 mg | ORAL_TABLET | Freq: Every day | ORAL | Status: DC
  Administered 2021-10-03 – 2021-10-08 (×6): 1 mg via ORAL
  Filled 2021-10-03 (×6): qty 1

## 2021-10-03 MED ORDER — PREDNISONE 20 MG PO TABS
60.0000 mg | ORAL_TABLET | Freq: Every day | ORAL | Status: DC
  Administered 2021-10-03: 60 mg via ORAL
  Filled 2021-10-03: qty 3

## 2021-10-03 MED ORDER — FENTANYL CITRATE PF 50 MCG/ML IJ SOSY
25.0000 ug | PREFILLED_SYRINGE | Freq: Once | INTRAMUSCULAR | Status: AC
  Administered 2021-10-03: 25 ug via INTRAVENOUS

## 2021-10-03 MED ORDER — CLONIDINE HCL (ANALGESIA) 100 MCG/ML EP SOLN
EPIDURAL | Status: DC | PRN
  Administered 2021-10-03: 50 ug

## 2021-10-03 MED ORDER — ENSURE ENLIVE PO LIQD
237.0000 mL | Freq: Two times a day (BID) | ORAL | Status: DC
  Administered 2021-10-03 – 2021-10-09 (×10): 237 mL via ORAL

## 2021-10-03 MED ORDER — PANTOPRAZOLE SODIUM 40 MG PO TBEC
40.0000 mg | DELAYED_RELEASE_TABLET | Freq: Every day | ORAL | Status: DC
  Administered 2021-10-03 – 2021-10-09 (×7): 40 mg via ORAL
  Filled 2021-10-03 (×7): qty 1

## 2021-10-03 NOTE — Progress Notes (Signed)
Patient returned to 5n08. Accepting nurse in room. I informed accepting nurse that patient has current order for tele monitoring and that the patient needs a tele box. Accepting nurse acknowledged current order and went to get a tele box. Safety sitter in room. All vital signs taken. See flow sheet for vital signs.

## 2021-10-03 NOTE — Assessment & Plan Note (Addendum)
-   Patient has a history of idiopathic chronic cold agglutinin disease and hemolytic anemia.  Has been following with Dr. Irene Limbo -Status post 3 units of packed RBCs, 2 on 1/11, 1 on 1/15 -Hemoglobin 7.6, discussed with hematology Dr Irene Limbo, no further need of transfusion if patient does not bleeding.  -Patient needs to stay warm and please check CBC and any hemolysis labs in prewarmed tubes at 37 degrees

## 2021-10-03 NOTE — Anesthesia Pain Management Evaluation Note (Signed)
°  Anesthesia Pain Consult Note  Patient: [name], 81 y.o., female  Consult Requested by: Mendel Corning, MD  Reason for Consult: Right Hip Fx  Level of Consciousness: alert , confused. Pain: Unable to assess as pt confused.   Last Vitals:  Vitals:   10/03/21 0033 10/03/21 0606  BP: (!) 131/53 (!) 131/48  Pulse: 90 86  Resp: 18 16  Temp:  37.1 C  SpO2: 93% 96%    Plan: Peripheral nerve block for pain control  Risks of wet tap, epidural hematoma and spinal cord injury explained to:   Consent:Risks of procedure as well as the alternatives and risks of each were explained to the (patient/caregiver).  Consent for procedure obtained.  Advance Directive:Patient not able to name a surrogate decision maker or provide an advance care plan.   Allergies  Allergen Reactions   Other Other (See Comments)    "Most antibiotics give yeast infection"   Shellfish Allergy Hives    Physical exam: PULM normal  CARDIO Heart sounds are normal.  Regular rate and rhythm without murmur, gallop or rub.  OTHER    I have reviewed the patient's medications listed below.  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[place])    montiored by dr Marko Plume   Renal and ureteric calculus    left   Past Surgical History:  Procedure Laterality Date   ABDOMINAL HYSTERECTOMY  1986   APPENDECTOMY  1967   BREAST EXCISIONAL BIOPSY Right 1984   CATARACT EXTRACTION W/ INTRAOCULAR LENS IMPLANT Right 1990's   congential cataract   CYSTOSCOPY W/  URETERAL STENT PLACEMENT  11/12/2015   Procedure: CYSTOSCOPY WITH RETROGRADE PYELOGRAM/URETERAL STENT PLACEMENT;  Surgeon: [name], MD;  Location: [facility];  Service: Urology;;   CYSTOSCOPY W/ URETERAL STENT REMOVAL Left 11/22/2015   Procedure: CYSTOSCOPY WITH STENT REMOVAL;  Surgeon: [name], MD;  Location: [facility];  Service: Urology;  Laterality: Left;   CYSTOSCOPY/URETEROSCOPY/HOLMIUM LASER Left 11/22/2015   Procedure: CYSTOSCOPY/URETEROSCOPY/HOLMIUM LASER;  Surgeon: [name], MD;  Location: [facility];  Service: Urology;  Laterality: Left;  FLEX URETEROSCOPY   FEMUR IM NAIL Left 08/04/2015   Procedure: INTRAMEDULLARY (IM) NAIL FEMORAL;  Surgeon: [name], MD;  Location: [place];  Service: Orthopedics;  Laterality: Left;   IR KYPHO THORACIC WITH BONE BIOPSY  06/22/2018   TONSILLECTOMY  age 31    reports that she quit smoking about 6 years ago. Her smoking use included cigarettes. She smoked an average of 1 pack per day. She has never used smokeless tobacco. She reports current alcohol use. She reports that she does not use drugs.    [name] 10/03/2021

## 2021-10-03 NOTE — Consult Note (Signed)
Reason for Consult:Right hip fx Referring Physician: Ripudeep [name] Time called: [id number] Time at bedside: [name] is an 81 y.o. female.  HPI: [name] fell at home 2-3 days ago. She was able to soldier on but was brought to the ED when she was still c/o hip and chest pain 2d later. X-rays showed a right hip fx and orthopedic surgery was consulted. Pt is demented and can only contribute somewhat to history.  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[place])    montiored by dr Marko Plume   Renal and ureteric calculus    left    Past Surgical History:  Procedure Laterality Date   ABDOMINAL HYSTERECTOMY  1986   APPENDECTOMY  1967   BREAST EXCISIONAL BIOPSY Right 1984   CATARACT EXTRACTION W/ INTRAOCULAR LENS IMPLANT Right 1990's   congential cataract   CYSTOSCOPY W/ URETERAL STENT PLACEMENT  11/12/2015   Procedure: CYSTOSCOPY WITH RETROGRADE PYELOGRAM/URETERAL STENT PLACEMENT;  Surgeon: [name], MD;  Location: [facility];  Service: Urology;;   CYSTOSCOPY W/ URETERAL STENT REMOVAL Left 11/22/2015   Procedure: CYSTOSCOPY WITH STENT REMOVAL;  Surgeon: [name], MD;  Location: [facility];  Service: Urology;  Laterality: Left;   CYSTOSCOPY/URETEROSCOPY/HOLMIUM LASER Left 11/22/2015   Procedure: CYSTOSCOPY/URETEROSCOPY/HOLMIUM LASER;  Surgeon: [name], MD;  Location: [facility];  Service: Urology;  Laterality: Left;  FLEX URETEROSCOPY   FEMUR IM NAIL Left 08/04/2015   Procedure: INTRAMEDULLARY (IM) NAIL FEMORAL;  Surgeon: [name], MD;  Location: [facility];  Service: Orthopedics;  Laterality: Left;   IR  KYPHO THORACIC WITH BONE BIOPSY  06/22/2018   TONSILLECTOMY  age 19    Family History  Problem Relation Age of Onset   Breast cancer Cousin     Social History:  reports that she quit smoking about 6 years ago. Her smoking use included cigarettes. She smoked an average of 1 pack per day. She has never used smokeless tobacco. She reports current alcohol use. She reports that she does not use drugs.  Allergies:  Allergies  Allergen Reactions   Other Other (See Comments)    "Most antibiotics give yeast infection"   Shellfish Allergy Hives    Medications: I have reviewed the patient's current medications.  Results for orders placed or performed during the hospital encounter of 10/02/21 (from the past 48 hour(s))  Comprehensive metabolic panel     Status: Abnormal   Collection Time: 10/02/21  6:44 PM  Result Value Ref Range   Sodium 135 135 - 145 mmol/L   Potassium 4.6 3.5 - 5.1 mmol/L   Chloride 94 (L) 98 - 111 mmol/L   CO2 32 22 - 32 mmol/L   Glucose, Bld 107 (H) 70 - 99 mg/dL    Comment: Glucose reference range applies only to samples taken after fasting for at least 8 hours.   BUN 15 8 - 23 mg/dL   Creatinine, Ser 0.79 0.44 - 1.00 mg/dL   Calcium 7.8 (L) 8.9 - 10.3 mg/dL   Total Protein 6.2 (L) 6.5 - 8.1 g/dL  Albumin 3.1 (L) 3.5 - 5.0 g/dL   AST 29 15 - 41 U/L   ALT 21 0 - 44 U/L   Alkaline Phosphatase 150 (H) 38 - 126 U/L   Total Bilirubin 3.1 (H) 0.3 - 1.2 mg/dL   GFR, Estimated >60 >60 mL/min    Comment: (NOTE) Calculated using the CKD-EPI Creatinine Equation (2021)    Anion gap 9 5 - 15    Comment: Performed at [facility] [address] [place], [place] [address]  CBC with Differential     Status: Abnormal   Collection Time: 10/02/21  6:44 PM  Result Value Ref Range   WBC 18.7 (H) 4.0 - 10.5 K/uL   RBC 2.18 (L) 3.87 - 5.11 MIL/uL   Hemoglobin 7.3 (L) 12.0 - 15.0 g/dL   HCT 20.9 (L) 36.0 - 46.0 %   MCV 95.9 80.0 - 100.0 fL   MCH 33.5 26.0 - 34.0 pg    MCHC 34.9 30.0 - 36.0 g/dL    Comment: REPEATED TO VERIFY CORRECTED FOR COLD AGGLUTININS    RDW 18.4 (H) 11.5 - 15.5 %   Platelets 659 (H) 150 - 400 K/uL   nRBC 0.3 (H) 0.0 - 0.2 %   Neutrophils Relative % 80 %   Neutro Abs 14.6 (H) 1.7 - 7.7 K/uL   Lymphocytes Relative 13 %   Lymphs Abs 2.3 0.7 - 4.0 K/uL   Monocytes Relative 4 %   Monocytes Absolute 0.8 0.1 - 1.0 K/uL   Eosinophils Relative 0 %   Eosinophils Absolute 0.1 0.0 - 0.5 K/uL   Basophils Relative 1 %   Basophils Absolute 0.1 0.0 - 0.1 K/uL   WBC Morphology TOXIC GRANULATION    Smear Review Normal platelet morphology    Immature Granulocytes 2 %   Abs Immature Granulocytes 0.29 (H) 0.00 - 0.07 K/uL   Tear Drop Cells PRESENT     Comment: Performed at [facility], [place] [address] [place], [place] [address]  Troponin I (High Sensitivity)     Status: None   Collection Time: 10/02/21  6:44 PM  Result Value Ref Range   Troponin I (High Sensitivity) 8 <18 ng/L    Comment: (NOTE) Elevated high sensitivity troponin I (hsTnI) values and significant  changes across serial measurements may suggest ACS but many other  chronic and acute conditions are known to elevate hsTnI results.  Refer to the "Links" section for chest pain algorithms and additional  guidance. Performed at [facility], [place] [address], [place], [place] [address]   Resp Panel by RT-PCR (Flu A&B, Covid) Nasopharyngeal Swab     Status: None   Collection Time: 10/02/21  9:34 PM   Specimen: Nasopharyngeal Swab; Nasopharyngeal(NP) swabs in vial transport medium  Result Value Ref Range   SARS Coronavirus 2 by RT PCR NEGATIVE NEGATIVE    Comment: (NOTE) SARS-CoV-2 target nucleic acids are NOT DETECTED.  The SARS-CoV-2 RNA is generally detectable in upper respiratory specimens during the acute phase of infection. The lowest concentration of SARS-CoV-2 viral copies this assay can detect is 138 copies/mL. A negative result does not preclude  SARS-Cov-2 infection and should not be used as the sole basis for treatment or other patient management decisions. A negative result may occur with  improper specimen collection/handling, submission of specimen other than nasopharyngeal swab, presence of viral mutation(s) within the areas targeted by this assay, and inadequate number of viral copies(<138 copies/mL). A negative result must be combined  with clinical observations, patient history, and epidemiological information. The expected result is Negative.  Fact Sheet for Patients:  EntrepreneurPulse.com.au  Fact Sheet for Healthcare Providers:  IncredibleEmployment.be  This test is no t yet approved or cleared by the [place] FDA and  has been authorized for detection and/or diagnosis of SARS-CoV-2 by FDA under an Emergency Use Authorization (EUA). This EUA will remain  in effect (meaning this test can be used) for the duration of the COVID-19 declaration under Section 564(b)(1) of the Act, 21 U.S.C.section 360bbb-3(b)(1), unless the authorization is terminated  or revoked sooner.       Influenza A by PCR NEGATIVE NEGATIVE   Influenza B by PCR NEGATIVE NEGATIVE    Comment: (NOTE) The Xpert Xpress SARS-CoV-2/FLU/RSV plus assay is intended as an aid in the diagnosis of influenza from Nasopharyngeal swab specimens and should not be used as a sole basis for treatment. Nasal washings and aspirates are unacceptable for Xpert Xpress SARS-CoV-2/FLU/RSV testing.  Fact Sheet for Patients: EntrepreneurPulse.com.au  Fact Sheet for Healthcare Providers: IncredibleEmployment.be  This test is not yet approved or cleared by the [place] FDA and has been authorized for detection and/or diagnosis of SARS-CoV-2 by FDA under an Emergency Use Authorization (EUA). This EUA will remain in effect (meaning this test can be used) for the duration of the COVID-19  declaration under Section 564(b)(1) of the Act, 21 U.S.C. section 360bbb-3(b)(1), unless the authorization is terminated or revoked.  Performed at [facility], [place] [address], [place], [place] [address]   Troponin I (High Sensitivity)     Status: None   Collection Time: 10/03/21  2:34 AM  Result Value Ref Range   Troponin I (High Sensitivity) 7 <18 ng/L    Comment: (NOTE) Elevated high sensitivity troponin I (hsTnI) values and significant  changes across serial measurements may suggest ACS but many other  chronic and acute conditions are known to elevate hsTnI results.  Refer to the "Links" section for chest pain algorithms and additional  guidance. Performed at [facility], [place] [address], [place], [place] [address]   Type and screen [place]     Status: None (Preliminary result)   Collection Time: 10/03/21  2:34 AM  Result Value Ref Range   ABO/RH(D) B POS    Antibody Screen NEG    Sample Expiration 10/06/2021,2359    DAT, IgG      NEG Performed at [facility] [address] [place], [place] [address]    Unit Number [id number]    Blood Component Type RED CELLS,LR    Unit division 00    Status of Unit ALLOCATED    Transfusion Status OK TO TRANSFUSE    Crossmatch Result COMPATIBLE    Unit Number [id number]    Blood Component Type RED CELLS,LR    Unit division 00    Status of Unit ALLOCATED    Transfusion Status OK TO TRANSFUSE    Crossmatch Result COMPATIBLE   Basic metabolic panel     Status: Abnormal   Collection Time: 10/03/21  2:34 AM  Result Value Ref Range   Sodium 134 (L) 135 - 145 mmol/L   Potassium 4.8 3.5 - 5.1 mmol/L   Chloride 95 (L) 98 - 111 mmol/L   CO2 28 22 - 32 mmol/L   Glucose, Bld 117 (H) 70 - 99 mg/dL    Comment: Glucose reference range applies only to samples taken after fasting for at least 8 hours.  BUN 14 8 - 23 mg/dL   Creatinine, Ser 0.59 0.44 - 1.00 mg/dL   Calcium 7.7 (L) 8.9 - 10.3  mg/dL   GFR, Estimated >60 >60 mL/min    Comment: (NOTE) Calculated using the CKD-EPI Creatinine Equation (2021)    Anion gap 11 5 - 15    Comment: Performed at [facility] [address] [place], [place] [address]  CBC     Status: Abnormal   Collection Time: 10/03/21  6:55 AM  Result Value Ref Range   WBC 18.4 (H) 4.0 - 10.5 K/uL   RBC 2.06 (L) 3.87 - 5.11 MIL/uL    Comment: CORRECTED FOR COLD AGGLUTININS   Hemoglobin 6.9 (LL) 12.0 - 15.0 g/dL    Comment: This critical result has verified and been called to [facility] by Karie Fetch on 01 11 2023 at 0852, and has been read back.    HCT 20.5 (L) 36.0 - 46.0 %   MCV 99.5 80.0 - 100.0 fL   MCH 33.5 26.0 - 34.0 pg   MCHC 33.7 30.0 - 36.0 g/dL    Comment: REPEATED TO VERIFY CORRECTED FOR COLD AGGLUTININS    RDW 19.9 (H) 11.5 - 15.5 %   Platelets 568 (H) 150 - 400 K/uL   nRBC 0.3 (H) 0.0 - 0.2 %    Comment: Performed at [facility] [address], [place], [place] [address]    DG Ribs Unilateral W/Chest Left  Result Date: 10/02/2021 CLINICAL DATA:  Fall, chest pain. EXAM: LEFT RIBS AND CHEST - 3+ VIEW COMPARISON:  Chest x-ray 08/10/2021. FINDINGS: Cardiomediastinal silhouette is stable, the heart is mildly enlarged. There are minimal patchy opacities in the left costophrenic angle. There may be a small left pleural effusion. There is no evidence for pneumothorax. There are atherosclerotic calcifications of the aorta. There is an acute left lateral ninth rib fracture. IMPRESSION: 1. Acute fracture left ninth rib. 2. Minimal left basilar atelectasis/airspace disease with questionable small left pleural effusion. Electronically Signed   By: Ronney Asters M.D.   On: 10/02/2021 20:26   CT HEAD WO CONTRAST (5MM)  Result Date: 10/03/2021 CLINICAL DATA:  Initial evaluation for acute head trauma. EXAM: CT HEAD WITHOUT CONTRAST TECHNIQUE: Contiguous axial images were obtained from the base of the skull through the vertex  without intravenous contrast. RADIATION DOSE REDUCTION: This exam was performed according to the departmental dose-optimization program which includes automated exposure control, adjustment of the mA and/or kV according to patient size and/or use of iterative reconstruction technique. COMPARISON:  None available. FINDINGS: Brain: Moderately advanced age-related cerebral atrophy with chronic small vessel ischemic disease. Small volume acute intraventricular hemorrhage centered about the foramen of Monro measures up to 12 mm (series 4, image 15). Additional possible trace hemorrhage within the left temporal horn (series 4, image 11). Mild ventricular prominence related to global parenchymal volume loss without hydrocephalus or trapping. No other acute intracranial hemorrhage. No acute large vessel territory infarct. No mass lesion or midline shift. No extra-axial fluid collection. Vascular: No hyperdense vessel. Scattered vascular calcifications noted within the carotid siphons. Skull: Scalp soft tissues within normal limits.  Calvarium intact. Sinuses/Orbits: Globes and orbital soft tissues demonstrate no acute finding. Paranasal sinuses are clear. No significant mastoid effusion. Other: None. IMPRESSION: 1. Small volume acute intraventricular hemorrhage measuring up to 12 mm, primarily centered about the foramen of Monro. No hydrocephalus or trapping. 2. Moderately advanced age-related cerebral atrophy with chronic small vessel ischemic disease. Critical Value/emergent  results were called by telephone at the time of interpretation on 10/03/2021 at 1:40 am to provider [facility] , who verbally acknowledged these results. Electronically Signed   By: Jeannine Boga M.D.   On: 10/03/2021 01:42   DG CHEST PORT 1 VIEW  Result Date: 10/03/2021 CLINICAL DATA:  Shortness of breath, hip fracture, COPD EXAM: PORTABLE CHEST 1 VIEW COMPARISON:  Chest radiograph dated October 02, 2021 FINDINGS: The heart is  enlarged. Atherosclerotic calcification of aortic arch. Elevation of the left hemidiaphragm with left basilar atelectasis and/or small left pleural effusion. The right lung is clear. Vertebroplasty of the lower thoracic vertebral body. Previously noted left ninth rib fracture is again noted. IMPRESSION: 1. Stable cardiomegaly. 2. Elevation of the left hemidiaphragm with left basilar atelectasis and/or small pleural effusion. Electronically Signed   By: Keane Police D.O.   On: 10/03/2021 08:37   DG Knee Complete 4 Views Right  Result Date: 10/02/2021 CLINICAL DATA:  Fall. EXAM: RIGHT KNEE - COMPLETE 4+ VIEW COMPARISON:  None. FINDINGS: There is no acute fracture or dislocation of the right knee. The bones are osteopenic. There is moderate arthritic changes. No significant joint effusion. There is diffuse subcutaneous edema. IMPRESSION: No acute fracture or dislocation of the right knee. Electronically Signed   By: Anner Crete M.D.   On: 10/02/2021 20:27   DG Hip Unilat W or Wo Pelvis 2-3 Views Right  Result Date: 10/02/2021 CLINICAL DATA:  Fall. EXAM: DG HIP (WITH OR WITHOUT PELVIS) 2-3V RIGHT COMPARISON:  None. FINDINGS: The bones are osteopenic. There is an acute comminuted right femoral intratrochanteric fracture with superolateral dislocation of the distal fracture fragment. There is no evidence for dislocation. Left-sided hip screw is partially visualized and appears uncomplicated. IMPRESSION: 1. Displaced comminuted right femoral intratrochanteric fracture. Electronically Signed   By: Ronney Asters M.D.   On: 10/02/2021 20:27    Review of Systems  Unable to perform ROS: Dementia  Blood pressure (!) 142/44, pulse 88, temperature 98.7 F (37.1 C), temperature source Oral, resp. rate 20, height 5\' 1"  (1.549 m), weight 41.7 kg, SpO2 98 %. Physical Exam Constitutional:      General: She is not in acute distress.    Appearance: She is well-developed. She is not diaphoretic.  HENT:     Head:  Normocephalic and atraumatic.  Eyes:     General: No scleral icterus.       Right eye: No discharge.        Left eye: No discharge.     Conjunctiva/sclera: Conjunctivae normal.  Cardiovascular:     Rate and Rhythm: Normal rate and regular rhythm.  Pulmonary:     Effort: Pulmonary effort is normal. No respiratory distress.  Musculoskeletal:     Cervical back: Normal range of motion.     Comments: RLE No traumatic wounds, ecchymosis, or rash  Mod TTP hip  No knee or ankle effusion  Knee stable to varus/ valgus and anterior/posterior stress  Sens DPN, SPN, TN intact  Motor EHL, ext, flex, evers 5/5  DP 2+, PT 1+, No significant edema  Skin:    General: Skin is warm and dry.  Neurological:     Mental Status: She is alert.  Psychiatric:        Mood and Affect: Mood normal.        Behavior: Behavior normal.    Assessment/Plan: Right hip fx -- Plan IMN tomorrow with Dr. Mable Fill. Please keep NPO after MN. Multiple medical problems including dementia and  cold agglutinin related chronic hemolytic anemia -- per primary service. Is getting blood transfusion today. Will order CBC for AM.    Lisette Abu, PA-C Orthopedic Surgery [phone] 10/03/2021, 9:32 AM

## 2021-10-03 NOTE — Progress Notes (Addendum)
.. 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 Patient was noted to have significant somnolence with her last Rituxan after not having slept for 4 days.  We switched her Benadryl to Claritin and the patient tolerated her Rituxan today well. She is continuing to have insomnia issues and wandering at home and remains at high risk for falls.  I again discussed this issue with her sister to have a family meeting to have a plan in place.  Patient ideally needs a skilled nursing facility with memory care.  The sister is already talked to her social worker about this but notes that placing the patient has been difficult.  I recommended a younger family member stay with her overnight and or that they hired private help to supervise her overnight since her elderly mother who is more than 43 years old cannot manage this.  Patient had no other acute issues with her treatment today. Has been eating well. Labs yesterday 09/26/2021 shows hemoglobin of 8.5 which is improved from 8.1.  Platelet counts remain elevated at 733k with increased WBC count of 14.2k.   REVIEW OF SYSTEMS:   Continues to have wandering and insomnia. Insomnia being managed by primary care physician.     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 Past Surgical History:  Procedure Laterality Date   ABDOMINAL HYSTERECTOMY  1986   APPENDECTOMY  1967   BREAST EXCISIONAL BIOPSY Right 1984   CATARACT EXTRACTION W/ INTRAOCULAR LENS IMPLANT Right 1990's   congential cataract   CYSTOSCOPY W/ URETERAL STENT PLACEMENT  11/12/2015   Procedure: CYSTOSCOPY WITH RETROGRADE PYELOGRAM/URETERAL STENT PLACEMENT;  Surgeon: [name], MD;  Location: [facility];  Service: Urology;;   CYSTOSCOPY W/ URETERAL STENT REMOVAL Left 11/22/2015   Procedure: CYSTOSCOPY WITH STENT REMOVAL;  Surgeon: [name], MD;  Location: [facility];  Service: Urology;  Laterality: Left;   CYSTOSCOPY/URETEROSCOPY/HOLMIUM LASER Left 11/22/2015   Procedure: CYSTOSCOPY/URETEROSCOPY/HOLMIUM LASER;  Surgeon: [name], MD;  Location: [facility];  Service: Urology;  Laterality: Left;  FLEX URETEROSCOPY   FEMUR IM NAIL Left 08/04/2015   Procedure: INTRAMEDULLARY (IM) NAIL FEMORAL;  Surgeon: [name], MD;  Location: [facility];  Service: Orthopedics;  Laterality: Left;   INTRAMEDULLARY (IM) NAIL INTERTROCHANTERIC Right 10/04/2021   Procedure: INTRAMEDULLARY (IM) NAIL INTERTROCHANTRIC;  Surgeon: [name], MD;  Location: [facility];  Service: Orthopedics;  Laterality: Right;   IR KYPHO THORACIC WITH BONE BIOPSY  06/22/2018   TONSILLECTOMY  age 81    .  Social History   Tobacco Use   Smoking status: Former    Packs/day: 1.00    Types: Cigarettes    Quit date: 08/03/2015    Years since quitting: 6.1   Smokeless tobacco: Never  Vaping Use   Vaping Use: Never used  Substance Use Topics   Alcohol use: Yes    Comment: occ   Drug use: Never    ALLERGIES:  is allergic to other and shellfish  allergy.  MEDICATIONS:  No current facility-administered medications for this visit.   No current outpatient medications on file.   Facility-Administered Medications Ordered in Other Visits  Medication Dose Route Frequency Provider Last Rate Last Admin   acetaminophen (TYLENOL) tablet 325-650 mg  325-650 mg Oral Q6H PRN [name], MD       docusate sodium (COLACE) capsule 100 mg  100 mg Oral BID [name], MD   100 mg at 10/07/21 1128   doxycycline (VIBRA-TABS) tablet 100 mg  100 mg Oral BID Pham, Minh Q, RPH-CPP   100 mg at 10/07/21 1128   enoxaparin (LOVENOX) injection 30 mg  30 mg Subcutaneous Q12H [name], MD   30 mg at 10/07/21 1128   feeding supplement (ENSURE ENLIVE / ENSURE PLUS) liquid 237 mL  237 mL Oral BID BM [name], Ripudeep K, MD   237 mL at 92/42/68 3419   folic acid (FOLVITE) tablet 1 mg  1 mg Oral Daily [name], Ripudeep K, MD   1 mg at 10/07/21 1128   HYDROcodone-acetaminophen (NORCO) 7.5-325 MG per tablet 1-2 tablet  1-2 tablet Oral Q4H PRN [name], MD   1 tablet at 10/07/21 0432   HYDROcodone-acetaminophen (NORCO/VICODIN) 5-325 MG per tablet 1-2 tablet  1-2 tablet Oral Q4H PRN [name], MD   1 tablet at 10/07/21 1709   melatonin tablet 3 mg  3 mg Oral QHS [name], Ripudeep K, MD       menthol-cetylpyridinium (CEPACOL) lozenge 3 mg  1 lozenge Oral PRN [name], MD       Or   phenol (CHLORASEPTIC) mouth spray 1 spray  1 spray Mouth/Throat PRN [name], MD       morphine 2 MG/ML injection 0.5-1 mg  0.5-1 mg Intravenous Q2H PRN [name], MD   1 mg at 10/07/21 1951   multivitamin with minerals tablet 1 tablet  1 tablet Oral Daily [name], Ripudeep K, MD   1 tablet at 10/07/21 1128   mupirocin ointment (BACTROBAN) 2 % 1 application  1 application Nasal BID Lisette Abu, PA-C   1 application at 62/22/97 1127   ondansetron (ZOFRAN) tablet 4 mg  4 mg Oral Q6H PRN [name], MD       Or   ondansetron [facility]) injection 4 mg  4 mg  Intravenous Q6H PRN [name], MD       pantoprazole (PROTONIX) EC tablet 40 mg  40 mg Oral Q0600 [name], Ripudeep K, MD   40 mg at 10/07/21 0554   QUEtiapine (SEROQUEL) tablet 25 mg  25 mg Oral QHS [name], Ripudeep K, MD   25 mg at 10/06/21 2120   tranexamic acid (CYKLOKAPRON) IVPB 1,000 mg  1,000 mg Intravenous Once [name], MD       vitamin B-12 (CYANOCOBALAMIN) tablet 2,000 mcg  2,000 mcg Oral Daily Rise Patience, MD   2,000 mcg at 10/07/21 1128    PHYSICAL EXAMINATION: ECOG PERFORMANCE STATUS: 2 Vital signs reviewed Exam was given in a chair. Marland Kitchen GENERAL:alert, in no  acute distress and comfortable SKIN: no acute rashes, no significant lesions EYES: conjunctiva are pink and non-injected, sclera anicteric OROPHARYNX: MMM, no exudates, no oropharyngeal erythema or ulceration NECK: supple, no JVD LYMPH:  no palpable lymphadenopathy in the cervical, axillary or inguinal regions LUNGS: clear to auscultation b/l with normal respiratory effort HEART: regular rate & rhythm ABDOMEN:  normoactive bowel sounds , non tender, not distended. Extremity: no pedal edema PSYCH: alert & oriented x 3 with fluent speech NEURO: no focal motor/sensory deficits     LABORATORY DATA:   I have reviewed the data as listed . Component     Latest Ref Rng & Units 09/26/2021  WBC     4.0 - 10.5 K/uL 14.2 (H)  RBC     3.87 - 5.11 MIL/uL 2.44 (L)  Hemoglobin     12.0 - 15.0 g/dL 8.5 (L)  HCT     36.0 - 46.0 % 24.3 (L)  MCV     80.0 - 100.0 fL 99.6  MCH     26.0 - 34.0 pg 34.8 (H)  MCHC     30.0 - 36.0 g/dL 35.0  RDW     11.5 - 15.5 % 19.6 (H)  Platelets     150 - 400 K/uL 733 (H)  nRBC     0.0 - 0.2 % 0.0  Neutrophils     % 75  NEUT#     1.7 - 7.7 K/uL 10.7 (H)  Lymphocytes     % 15  Lymphocyte #     0.7 - 4.0 K/uL 2.2  Monocytes Relative     % 7  Monocyte #     0.1 - 1.0 K/uL 1.0  Eosinophil     % 1  Eosinophils Absolute     0.0 - 0.5 K/uL 0.2  Basophil     % 1   Basophils Absolute     0.0 - 0.1 K/uL 0.1  Immature Granulocytes     % 1  Abs Immature Granulocytes     0.00 - 0.07 K/uL 0.08 (H)  Sodium     135 - 145 mmol/L 140  Potassium     3.5 - 5.1 mmol/L 3.9  Chloride     98 - 111 mmol/L 96 (L)  CO2     22 - 32 mmol/L 36 (H)  Glucose     70 - 99 mg/dL 105 (H)  BUN     8 - 23 mg/dL 8  Creatinine     0.44 - 1.00 mg/dL 0.57  Calcium     8.9 - 10.3 mg/dL 8.3 (L)  Total Protein     6.5 - 8.1 g/dL 6.5  Albumin     3.5 - 5.0 g/dL 3.8  AST     15 - 41 U/L 16  ALT     0 - 44 U/L 8  Alkaline Phosphatase     38 - 126 U/L 130 (H)  Total Bilirubin     0.3 - 1.2 mg/dL 2.0 (H)  GFR, Est Non African American     >60 mL/min >60  Anion gap     5 - 15 8           RADIOGRAPHIC STUDIES: I have personally reviewed the radiological images as listed and agreed with the findings in the report. DG Ribs Unilateral W/Chest Left  Result Date: 10/02/2021 CLINICAL DATA:  Fall, chest pain. EXAM: LEFT RIBS AND CHEST - 3+ VIEW COMPARISON:  Chest x-ray 08/10/2021. FINDINGS:  Cardiomediastinal silhouette is stable, the heart is mildly enlarged. There are minimal patchy opacities in the left costophrenic angle. There may be a small left pleural effusion. There is no evidence for pneumothorax. There are atherosclerotic calcifications of the aorta. There is an acute left lateral ninth rib fracture. IMPRESSION: 1. Acute fracture left ninth rib. 2. Minimal left basilar atelectasis/airspace disease with questionable small left pleural effusion. Electronically Signed   By: Ronney Asters M.D.   On: 10/02/2021 20:26   CT HEAD WO CONTRAST (5MM)  Result Date: 10/03/2021 CLINICAL DATA:  Initial evaluation for acute head trauma. EXAM: CT HEAD WITHOUT CONTRAST TECHNIQUE: Contiguous axial images were obtained from the base of the skull through the vertex without intravenous contrast. RADIATION DOSE REDUCTION: This exam was performed according to the departmental  dose-optimization program which includes automated exposure control, adjustment of the mA and/or kV according to patient size and/or use of iterative reconstruction technique. COMPARISON:  None available. FINDINGS: Brain: Moderately advanced age-related cerebral atrophy with chronic small vessel ischemic disease. Small volume acute intraventricular hemorrhage centered about the foramen of Monro measures up to 12 mm (series 4, image 15). Additional possible trace hemorrhage within the left temporal horn (series 4, image 11). Mild ventricular prominence related to global parenchymal volume loss without hydrocephalus or trapping. No other acute intracranial hemorrhage. No acute large vessel territory infarct. No mass lesion or midline shift. No extra-axial fluid collection. Vascular: No hyperdense vessel. Scattered vascular calcifications noted within the carotid siphons. Skull: Scalp soft tissues within normal limits.  Calvarium intact. Sinuses/Orbits: Globes and orbital soft tissues demonstrate no acute finding. Paranasal sinuses are clear. No significant mastoid effusion. Other: None. IMPRESSION: 1. Small volume acute intraventricular hemorrhage measuring up to 12 mm, primarily centered about the foramen of Monro. No hydrocephalus or trapping. 2. Moderately advanced age-related cerebral atrophy with chronic small vessel ischemic disease. Critical Value/emergent results were called by telephone at the time of interpretation on 10/03/2021 at 1:40 am to provider [facility] , who verbally acknowledged these results. Electronically Signed   By: Jeannine Boga M.D.   On: 10/03/2021 01:42   DG CHEST PORT 1 VIEW  Result Date: 10/03/2021 CLINICAL DATA:  Shortness of breath, hip fracture, COPD EXAM: PORTABLE CHEST 1 VIEW COMPARISON:  Chest radiograph dated October 02, 2021 FINDINGS: The heart is enlarged. Atherosclerotic calcification of aortic arch. Elevation of the left hemidiaphragm with left basilar  atelectasis and/or small left pleural effusion. The right lung is clear. Vertebroplasty of the lower thoracic vertebral body. Previously noted left ninth rib fracture is again noted. IMPRESSION: 1. Stable cardiomegaly. 2. Elevation of the left hemidiaphragm with left basilar atelectasis and/or small pleural effusion. Electronically Signed   By: Keane Police D.O.   On: 10/03/2021 08:37   DG Knee Complete 4 Views Right  Result Date: 10/02/2021 CLINICAL DATA:  Fall. EXAM: RIGHT KNEE - COMPLETE 4+ VIEW COMPARISON:  None. FINDINGS: There is no acute fracture or dislocation of the right knee. The bones are osteopenic. There is moderate arthritic changes. No significant joint effusion. There is diffuse subcutaneous edema. IMPRESSION: No acute fracture or dislocation of the right knee. Electronically Signed   By: Anner Crete M.D.   On: 10/02/2021 20:27   DG C-Arm 1-60 Min-No Report  Result Date: 10/04/2021 Fluoroscopy was utilized by the requesting physician.  No radiographic interpretation.   DG C-Arm 1-60 Min-No Report  Result Date: 10/04/2021 Fluoroscopy was utilized by the requesting physician.  No radiographic interpretation.   DG  C-Arm 1-60 Min-No Report  Result Date: 10/04/2021 Fluoroscopy was utilized by the requesting physician.  No radiographic interpretation.   DG Hip Unilat W or Wo Pelvis 2-3 Views Right  Result Date: 10/02/2021 CLINICAL DATA:  Fall. EXAM: DG HIP (WITH OR WITHOUT PELVIS) 2-3V RIGHT COMPARISON:  None. FINDINGS: The bones are osteopenic. There is an acute comminuted right femoral intratrochanteric fracture with superolateral dislocation of the distal fracture fragment. There is no evidence for dislocation. Left-sided hip screw is partially visualized and appears uncomplicated. IMPRESSION: 1. Displaced comminuted right femoral intratrochanteric fracture. Electronically Signed   By: Ronney Asters M.D.   On: 10/02/2021 20:27   DG FEMUR, MIN 2 VIEWS RIGHT  Result Date:  10/05/2021 CLINICAL DATA:  Postop EXAM: RIGHT FEMUR 2 VIEWS COMPARISON:  10/02/2021 FINDINGS: Internal fixation across the proximal right femoral intertrochanteric fracture. No hardware complicating feature. Mild residual displacement across the fracture fragments. No subluxation or dislocation. IMPRESSION: Internal fixation with mild continued displacement. No hardware complicating feature. Electronically Signed   By: Rolm Baptise M.D.   On: 10/05/2021 00:24   DG FEMUR, MIN 2 VIEWS RIGHT  Result Date: 10/04/2021 CLINICAL DATA:  Intramedullary nail. EXAM: RIGHT FEMUR 2 VIEWS COMPARISON:  Preoperative radiographs 10/02/2021 FINDINGS: Six fluoroscopic spot views of the right femur obtained in the operating room in frontal and lateral projections. Lung intramedullary nail with distal locking and trans trochanteric screw fixation of intertrochanteric femur fracture. Improved fracture alignment from preoperative imaging. Fluoroscopy time 4 minutes 41 seconds. Dose 14.13 mGy. IMPRESSION: Intraoperative fluoroscopy during right femur fracture ORIF. Electronically Signed   By: Keith Rake M.D.   On: 10/04/2021 16:47    ASSESSMENT & PLAN:   #1: Cold Agglutinin related chronic hemolytic anemia. Her hemoglobin tends to be lower during winter and has been in the 6-8 range. Overall stable hemoglobin levels in the 6-7 range with folic acid replacement and cold avoidance.  #2 IgM lambda MGUS - cannot rule out possibility of lymphoplasmacytic lymphoma though less likely given chronicity of her condition. Flow cytometry- no clonoal B cell lymphoma   #3 History of previous B12 deficiency  #4 hypokalemia- resolved. -Potassium, begin taking BID with orange juice, and pt completed her prednisone course so I expect her potassium levels to increase  -08/28/18 Potassium at 3.6  #5 Low back pain -compression fracture L4 -MRI L spine -Acute/subacute superior endplate fracture at L4 with loss of height of 10-20%. No  retropulsed bone. This is quite likely the cause of the low back pain. This was probably present on the radiographs of 01/20/2018, but not diagnosable because of the spinal curvature and unlevel endplates. Old healed superior endplate fracture at A35 Plan -controlled discomfort -prn Tramadol for back pain   #6 Spiculated Lung nodule in right upper lobe  First seen on 05/28/18 CTA Chest Hasn't changed over 6 month interval with repeat CT Chest in February 2020  10/26/18 CT Chest which revealed There is a spiculated solid nodule of the right upper lobe, measuring 5-6 mm and stable from prior examination. Recommend additional follow-up in 12 months to ensure ongoing stability. 2.  Emphysema. 3. Increased height loss deformities of T11 and T12.  08/13/2019 CT Chest ([id number]) revealed "4 mm posterior right upper lobe pulmonary nodule, unchanged. Dedicated follow-up imaging is not required per Fleischner Society guidelines. This recommendation follows the consensus statement Guidelines for Management of Small Pulmonary Nodules Detected on CT Images: From the Fleischner Society 2017; Radiology 2017; 284:228-243. Aortic Atherosclerosis (ICD10-I70.0) and Emphysema (ICD10-J43.9)."  #  7 Vitamin D deficiency -continue Vit D  #8 Patient Active Problem List   Diagnosis Date Noted   Left 9th rib fracture 10/05/2021   Dementia ([place]) 10/03/2021   Intraventricular hemorrhage ([place]) 10/03/2021   Hip fracture ([place]) 10/02/2021   Sepsis due to pneumonia ([place]) 08/10/2021   Sepsis due to undetermined organism ([place]) 08/09/2021   Aortic atherosclerosis ([place]) 08/09/2021   COPD (chronic obstructive pulmonary disease) ([facility])    Elevated transaminase level    Symptomatic anemia 05/28/2018   Autoimmune hemolytic anemia ([place]) 02/25/2018   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 ([place]) 02/04/2018   Occult blood in stools 02/04/2018   Ureteral calculus 11/12/2015   Breast cancer screening, high risk patient 08/29/2015    Acute delirium 08/07/2015   Postoperative anemia due to acute blood loss 08/06/2015   Protein-calorie malnutrition, severe 08/04/2015   Closed intertrochanteric fracture of left femur ([place]) 08/03/2015   Fall 08/03/2015   Closed right hip fracture ([place]) 08/03/2015   Hyperbilirubinemia 08/01/2013   Hypokalemia 07/30/2013   Vitamin B 12 deficiency 08/07/2012   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   PLAN: -Since labs from 09/26/2021 were discussed in detail.  Her hemoglobin is improved to 8.5. -No indication for PRBC transfusion at this time. -We discussed importance of continued cold avoidance that sometimes has been a challenge due to her dementia and wandering. -We changed her Benadryl to Claritin with her third dose of Rituxan today and she tolerated it better. -No issues with tolerating her third dose of Rituxan today. -She continues to have concerns with significant progressive dementia with a tendency to having delirium wandering at night.  Goals of care and concerns for risk of falls and injuries were discussed in detail with her sister at bedside.  The family has continued to discuss potential options.  I recommended that they have a younger family member (not her 30+-year-old mother) or private paid help stay with the patient overnight due to her wanting to reduce the risk of falls. -Continue B12, B complex and vitamin D replacement. -Transfuse PRBCs as needed for hemoglobin less than 7  FOLLOW UP: Return to clinic with Dr. Irene Limbo with labs for 4th dose of Rituxan in 1 week.   All of the patient's questions were answered with apparent satisfaction. The patient knows to call the clinic with any problems, questions or concerns.   Sullivan Lone  MD Elaine AAHIVMS [facility] Hematology/Oncology Physician [facility].Marland Kitchen

## 2021-10-03 NOTE — Progress Notes (Signed)
Did the best I could with trying to document.  Patient is really only oriented to self and even that is questionable.  No answer at the phone number listed for her to try and clarify some of these questions.   No visitors in the room or available.

## 2021-10-03 NOTE — Assessment & Plan Note (Addendum)
-   Patient was much more drowsy after Seroquel was increased to 50 mg   -Continue Seroquel 25 mg daily qPM, melatonin as needed for sleeping

## 2021-10-03 NOTE — Assessment & Plan Note (Addendum)
-   Secondary to mechanical fall - Status post ORIF, on 10/04/2021 -Minimize sedatives and narcotics, continue PT OT.  -Per orthopedics recommendations aspirin 325 mg daily for 5 weeks for DVT prophylaxis and follow-up with patient in 2 weeks. -Weightbearing as tolerated on her right lower extremity

## 2021-10-03 NOTE — Consult Note (Signed)
Neurosurgery Consultation  Reason for Consult: Intraventricular hemorrhage Referring Physician: [name]  CC: Hip pain  HPI: This is a 81 y.o. woman with a history of hemolytic anemia, dementia s/p fall with right hip fracture. She had some confusion while admitted, which prompted a CTH and showed IVH. Today, she complains of right hip pain, but no headaches / N / V, feels like she is at her neurologic baseline. No recent use of anti-platelet or anti-coagulant medications.   ROS: A 14 point ROS was performed and is negative except as noted in the HPI.   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 Her smoking use included cigarettes. She smoked an average of 1 pack per day. She has never used smokeless tobacco. She reports current alcohol use. She reports that she does not use drugs.  Exam: Vital signs in last 24 hours: Temp:  [97.5 F (36.4 C)-99.2 F (37.3 C)] 97.8 F (36.6 C) (01/12 0723) Pulse Rate:  [74-92] 74 (01/12 0723) Resp:  [16-18] 17 (01/12 0723) BP: (118-143)/(30-71) 128/52 (01/12 0723) SpO2:  [93 %-100 %] 99 % (01/12 0723) General: Awake, alert, cooperative, lying in bed in NAD Head: Normocephalic and atruamatic HEENT: Neck supple Pulmonary: breathing room air comfortably, no evidence of increased work of breathing - has her nasal cannula prongs in her left hand Cardiac:  RRR Abdomen: S NT ND Extremities: Warm and well perfused x4, right foot externally rotated Neuro: Aox1 (year '1942', location: 'virus'), PERRL, EOMI, FS Strength 5/5 x4 but I did not do testing of the right hip and limited testing of the distal RLE due to her hip fracture, [id number]   Assessment and Plan: 81 y.o. woman s/p fall with right hip fracture, acute on chronic confusion. [place] personally reviewed, which shows trace supratentorial intraventricular hemorrhage, patent flow around the IVH, no ventriculomegaly.   -no acute neurosurgical intervention indicated at this time -I don't see any outpatient medications that would produce a coagulopathy. Given the hip fracture, if she requires anticoagulation then I would wait 3 days and repeat the CT head before starting it. If stable or improved, okay to start anticoagulation. Obviously high risk from a neurosurgical perspective for repeat hemorrhage given the falls and recent hemorrhage. -no need for scheduled follow up with me unless she develops new neurologic symptoms or concerns -please call with any concerns or questions  Judith Part, MD 10/04/21 9:59 AM [facility]

## 2021-10-03 NOTE — Assessment & Plan Note (Addendum)
-   No wheezing, currently stable -Chest x-ray showed stable cardiomegaly, left basilar atelectasis and/or small pleural effusion

## 2021-10-03 NOTE — Progress Notes (Signed)
Date and time results received: 10/03/21 0901 (use smartphrase ".now" to insert current time)  Test: hemoglobin  Critical Value: 6.9  Name of Provider Notified: Dr. Tana Coast  Orders Received? Or Actions Taken?: Paged- Dr. Tana Coast putting in orders

## 2021-10-03 NOTE — Progress Notes (Signed)
Initial Nutrition Assessment  DOCUMENTATION CODES:   Severe malnutrition in context of chronic illness, Underweight  INTERVENTION:  - Encourage PO intake - Ensure Enlive po BID, each supplement provides 350 kcal and 20 grams of protein - MVI with minerals daily  NUTRITION DIAGNOSIS:   Severe Malnutrition related to chronic illness (dementia, hemolytic anemia) as evidenced by severe fat depletion, severe muscle depletion.  GOAL:   Patient will meet greater than or equal to 90% of their needs  MONITOR:   PO intake, Supplement acceptance, Labs, Weight trends  REASON FOR ASSESSMENT:   Consult Hip fracture protocol  ASSESSMENT:   Pt admitted from home with R hip fracture and L ninth rib fracture after a fall. 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  Noted plans for R hip IMN tomorrow.  Spoke with pt at bedside. She provided limited history as pt noted to have dementia, she reports eating 3 meals per day but unable to recall what she eats except she enjoys desserts such as cake.   Per review of chart, noted pt with weight loss of 3% in the last month.   Medications: folvite, protonix, prednisone, Vitamin B12, IV abx  Labs: sodium 134, corrected calcium 8.42, hemoglobin 6.9  NUTRITION - FOCUSED PHYSICAL EXAM:  Flowsheet Row Most Recent Value  Orbital Region Moderate depletion  Upper Arm Region Severe depletion  Thoracic and Lumbar Region Severe depletion  Buccal Region Mild depletion  Temple Region Moderate depletion  Clavicle Bone Region Severe depletion  Clavicle and Acromion Bone Region Severe depletion  Scapular Bone Region Severe depletion  Dorsal Hand Severe depletion  Patellar Region Severe depletion  Anterior Thigh Region Severe depletion  Posterior Calf Region Severe depletion  [bilateral scabbing]  Edema (RD Assessment) Mild  [BLE]  Hair Reviewed  Eyes Reviewed  Mouth Reviewed  Skin Reviewed   Nails Reviewed       Diet Order:   Diet Order             Diet NPO time specified Except for: Sips with Meds  Diet effective midnight           Diet regular Room service appropriate? Yes; Fluid consistency: Thin  Diet effective now                   EDUCATION NEEDS:   Education needs have been addressed  Skin:  Skin Assessment: Reviewed RN Assessment  Last BM:  PTA  Height:   Ht Readings from Last 1 Encounters:  10/02/21 5\' 1"  (1.549 m)    Weight:   Wt Readings from Last 1 Encounters:  10/02/21 41.7 kg   BMI:  Body mass index is 17.38 kg/m.  Estimated Nutritional Needs:   Kcal:  1450-1650  Protein:  70-85g  Fluid:  >/=1.5L  Clayborne Dana, RDN, LDN Clinical Nutrition

## 2021-10-03 NOTE — Progress Notes (Signed)
Orthopedic Tech Progress Note Patient Details:  [name] [birth year] [id number]  Patient ID: [name], female   DOB: 01-01-1941, 81 y.o.   MRN: [id number] No OHF; pt over 35.  Vernona Rieger 10/03/2021, 7:23 AM

## 2021-10-03 NOTE — TOC CAGE-AID Note (Signed)
Transition of Care [facility]) - CAGE-AID Screening   Patient Details  Name: [name] MRN: [id number] Date of Birth: 05/06/1941  Transition of Care [facility]) CM/SW Contact:    Treyce Spillers C Tarpley-Carter, [place] Phone Number: 10/03/2021, 12:44 PM   Clinical Narrative: Pt is unable to participate in Cage Aid.  Pt is experiencing dementia.  Pt is not a good candidate for assessment.  Viyan Rosamond Tarpley-Carter, MSW, LCSW-A Pronouns:  She/Her/[place] Transitions of Care Clinical Social Worker Direct Number:  [phone] [email or web address]    CAGE-AID Screening: Substance Abuse Screening unable to be completed due to: : Patient unable to participate             Substance Abuse Education Offered: No

## 2021-10-03 NOTE — Anesthesia Procedure Notes (Signed)
Anesthesia Regional Block: Femoral nerve block   Pre-Anesthetic Checklist: , timeout performed,  Correct Patient, Correct Site, Correct Laterality,  Correct Procedure, Correct Position, site marked,  Risks and benefits discussed,  Surgical consent,  Pre-op evaluation,  At surgeon's request and post-op pain management  Laterality: Right  Prep: chloraprep       Needles:  Injection technique: Single-shot  Needle Type: Echogenic Stimulator Needle     Needle Length: 9cm  Needle Gauge: 21     Additional Needles:   Procedures:, nerve stimulator,,, ultrasound used (permanent image in chart),,     Nerve Stimulator or Paresthesia:  Response: quad, 0.5 mA  Additional Responses:   Narrative:  Start time: 10/03/2021 8:55 AM End time: 10/03/2021 9:03 AM Injection made incrementally with aspirations every 5 mL.  Performed by: Personally  Anesthesiologist: Suzette Battiest, MD

## 2021-10-03 NOTE — Assessment & Plan Note (Addendum)
-   Hemoglobin 6.9, on 1/11 possibly due to the fracture, intraventricular hemorrhage, discussed with hematology, Dr Irene Limbo. S/p 2 units packed RBCs. - received 3 units packed RBCs 2 on 1/11, 1 on 1/15.   -Hemoglobin currently stable 7.6, no further need of transfusion per hematology.  - Continue folic acid 2 mg daily -DVT prophylaxis with aspirin 325 mg daily for 5 weeks

## 2021-10-03 NOTE — Hospital Course (Addendum)
Patient is a 81 year old female with history of chronic cold agglutinin hemolytic anemia, dementia presented with fall 3 days ago.  Patient was complaining of hip and chest pain 2 days later.  X-ray showed right hip fracture.  CT head also showed small intraventricular hemorrhage Dr. Hal Hope discussed with Dr. Venetia Constable, neurosurgery, at this time no neurosurgical interventions needed.  Patient was admitted for further work-up, underwent ORIF on 10/04/2021.  Hospital course was complicated due to symptomatic anemia requiring multiple transfusions.

## 2021-10-03 NOTE — Progress Notes (Signed)
Pt arrived to floor agitated to be there, wanting to go home and stating, "you are hitting my head" when no one was standing by patients head. Pt tolerated being transferred from stretcher to bed with minimal issues. She continues to be confused, only knowing her name and repeatedly asking to go home to her family. She is constantly attempting to get out of the bed to "walk home from here" Safety sitter at bedside, not able to reorient but able to keep patient safe in bed and room. Pt is not following commands and forgets she asked and was answered questions within 5-10 minutes. Pt tolerated CT scan and was able to state, "my foot hurts" Using PAIND system pt did show signs of pain and Morphine was given. She refused to keep telemetry on, just continued to take leads off along with O2 sensor for continuous monitoring. She began kicking her left leg when SCD's were attempted to be applied to her. She continues to state, "I did not break my hip, I have no reason to be here, you can't keep me here"

## 2021-10-03 NOTE — Plan of Care (Signed)

## 2021-10-03 NOTE — Progress Notes (Signed)
°  Progress Note   Patient: [name] DHR:[id number] DOB: 03-Nov-1940 DOA: 10/02/2021     1 DOS: the patient was seen and examined on 10/03/2021   Brief hospital course: Patient is a 81 year old female with history of chronic cold agglutinin hemolytic anemia, dementia presented with fall 3 days ago.  Patient was complaining of hip and chest pain 2 days later.  X-ray showed right hip fracture.  CT head also showed small intraventricular hemorrhage Dr. Hal Hope discussed with Dr. Venetia Constable, neurosurgery, at this time no neurosurgical interventions needed. Plan for orthopedic surgery on 1/12.  Assessment and Plan * Closed right hip fracture ([place])- (present on admission) - Secondary to mechanical fall -Orthopedics consulted, plan for intramedullary nail surgery tomorrow, 1/12  Intraventricular hemorrhage ([place]) - Due to fall.  CT head showed small intraventricular hemorrhage.  Admitting physician, Dr. Hal Hope discussed with Dr. Venetia Constable (neurosurgery) who recommended no intervention needed at this time.   Symptomatic anemia- (present on admission) - Hemoglobin 6.9, possibly due to the fracture, intraventricular hemorrhage, discussed with hematology, Dr Irene Limbo -Has chronic cold agglutinin hemolytic anemia.  Recommended 2 units packed RBC transfusion, to keep hemoglobin above 9, warmed.  Placed on folic acid 1 mg daily, had placed on prednisone 60 mg however Dr. 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  Has been following with Dr. Irene Limbo -Discussed with Dr. Irene Limbo, recommended blood transfusion, warmed, to keep hemoglobin ~9, will follow.   COPD (chronic obstructive pulmonary disease) ([place])- (present on admission) - Currently stable, no wheezing. -Chest x-ray showed  stable cardiomegaly, left basilar atelectasis and/or small pleural effusion  Fall- (present on admission) - PT OT evaluation after surgery  Dementia ([place])- (present on admission) - Currently appears to be stable.  Follow closely, high risk for delirium while inpatient. -Cautious use of sedating or pain medications    Subjective: No new complaints, right hip pain, awaiting surgery.  No fevers or chills, no nausea vomiting or diarrhea.  Objective Vital signs were reviewed and unremarkable. BP (!) 130/30    Pulse 81    Temp 97.9 F (36.6 C) (Oral)    Resp 16    Ht 5\' 1"  (1.549 m)    Wt 41.7 kg    SpO2 100%    BMI 17.38 kg/m    Physical Exam General: Alert and oriented x 3, NAD Cardiovascular: S1 S2 clear, RRR. No pedal edema b/l Respiratory: CTAB, no wheezing, rales or rhonchi Gastrointestinal: Soft, nontender, nondistended, NBS Ext: no pedal edema bilaterally Psych: Normal affect and demeanor, alert and oriented x3   Data Reviewed:  My review of labs, imaging, notes and other tests shows no new significant findings.  Except hemoglobin of 6.9, leukocytosis 18.4  Family Communication: Discussed with patient  Disposition: Status is: Inpatient  Remains inpatient appropriate because: Plan for hip surgery in a.m.   Time spent: 35 minutes  Author: Estill Cotta, MD 10/03/2021 2:13 PM  For on call review [email or web address].

## 2021-10-03 NOTE — Assessment & Plan Note (Addendum)
-   Due to fall.  CT head showed small intraventricular hemorrhage.  Admitting physician, Dr. Hal Hope discussed with Dr. Venetia Constable (neurosurgery) who recommended no intervention needed at this time.  -No acute issues, alert, pleasantly confused, no new FND's, appears close to her baseline -Outpatient follow-up with Dr. Venetia Constable as needed

## 2021-10-03 NOTE — Assessment & Plan Note (Addendum)
-   PT evaluation done, recommended SNF.  Due to advanced dementia, frail, medical comorbidities, recent fracture, very high risk for falls.  Discussed with orthopedics, Dr. Mable Fill recommended aspirin 325 mg daily for 5 weeks

## 2021-10-04 ENCOUNTER — Inpatient Hospital Stay (HOSPITAL_COMMUNITY): Payer: Medicare Other | Admitting: General Practice

## 2021-10-04 ENCOUNTER — Inpatient Hospital Stay (HOSPITAL_COMMUNITY): Payer: Medicare Other

## 2021-10-04 ENCOUNTER — Encounter (HOSPITAL_COMMUNITY): Payer: Self-pay | Admitting: Internal Medicine

## 2021-10-04 ENCOUNTER — Encounter (HOSPITAL_COMMUNITY)
Admission: EM | Disposition: A | Discharge status: Skilled Nursing Facility | Payer: Self-pay | Source: Home / Self Care | Attending: Internal Medicine

## 2021-10-04 DIAGNOSIS — D649 Anemia, unspecified: Secondary | ICD-10-CM | POA: Diagnosis not present

## 2021-10-04 DIAGNOSIS — S2232XA Fracture of one rib, left side, initial encounter for closed fracture: Secondary | ICD-10-CM | POA: Diagnosis not present

## 2021-10-04 DIAGNOSIS — S72142A Displaced intertrochanteric fracture of left femur, initial encounter for closed fracture: Secondary | ICD-10-CM | POA: Diagnosis not present

## 2021-10-04 DIAGNOSIS — S72001A Fracture of unspecified part of neck of right femur, initial encounter for closed fracture: Secondary | ICD-10-CM | POA: Diagnosis not present

## 2021-10-04 HISTORY — PX: INTRAMEDULLARY (IM) NAIL INTERTROCHANTERIC: SHX5875

## 2021-10-04 LAB — CBC
HCT: 29.2 % — ABNORMAL LOW (ref 36.0–46.0)
Hemoglobin: 10.9 g/dL — ABNORMAL LOW (ref 12.0–15.0)
MCH: 36.2 pg — ABNORMAL HIGH (ref 26.0–34.0)
MCHC: 37.3 g/dL — ABNORMAL HIGH (ref 30.0–36.0)
MCV: 97 fL (ref 80.0–100.0)
Platelets: 438 10*3/uL — ABNORMAL HIGH (ref 150–400)
RBC: 3.01 MIL/uL — ABNORMAL LOW (ref 3.87–5.11)
RDW: 19.9 % — ABNORMAL HIGH (ref 11.5–15.5)
WBC: 20.2 10*3/uL — ABNORMAL HIGH (ref 4.0–10.5)
nRBC: 0.2 % (ref 0.0–0.2)

## 2021-10-04 LAB — TYPE AND SCREEN
ABO/RH(D): B POS
Antibody Screen: NEGATIVE
DAT, IgG: NEGATIVE
Unit division: 0
Unit division: 0

## 2021-10-04 LAB — BPAM RBC
Blood Product Expiration Date: 202302152359
Blood Product Expiration Date: 202302152359
ISSUE DATE / TIME: 202301111235
ISSUE DATE / TIME: 202301111604
Unit Type and Rh: 5100
Unit Type and Rh: 5100

## 2021-10-04 LAB — SURGICAL PCR SCREEN
MRSA, PCR: NEGATIVE
Staphylococcus aureus: POSITIVE — AB

## 2021-10-04 SURGERY — FIXATION, FRACTURE, INTERTROCHANTERIC, WITH INTRAMEDULLARY ROD
Anesthesia: Spinal | Laterality: Right

## 2021-10-04 MED ORDER — CHLORHEXIDINE GLUCONATE 4 % EX LIQD
60.0000 mL | Freq: Once | CUTANEOUS | Status: DC
  Filled 2021-10-04: qty 60

## 2021-10-04 MED ORDER — ROCURONIUM BROMIDE 100 MG/10ML IV SOLN
INTRAVENOUS | Status: DC | PRN
  Administered 2021-10-04: 40 mg via INTRAVENOUS
  Administered 2021-10-04: 20 mg via INTRAVENOUS

## 2021-10-04 MED ORDER — FENTANYL CITRATE (PF) 250 MCG/5ML IJ SOLN
INTRAMUSCULAR | Status: AC
  Filled 2021-10-04: qty 5

## 2021-10-04 MED ORDER — MUPIROCIN 2 % EX OINT
1.0000 "application " | TOPICAL_OINTMENT | Freq: Two times a day (BID) | CUTANEOUS | Status: AC
  Administered 2021-10-04 – 2021-10-08 (×8): 1 via NASAL
  Filled 2021-10-04 (×4): qty 22

## 2021-10-04 MED ORDER — QUETIAPINE FUMARATE 25 MG PO TABS
25.0000 mg | ORAL_TABLET | ORAL | Status: DC
  Administered 2021-10-04: 25 mg via ORAL
  Filled 2021-10-04: qty 1

## 2021-10-04 MED ORDER — CHLORHEXIDINE GLUCONATE 0.12 % MT SOLN
OROMUCOSAL | Status: AC
  Administered 2021-10-04: 15 mL via OROMUCOSAL
  Filled 2021-10-04: qty 15

## 2021-10-04 MED ORDER — VANCOMYCIN HCL 1000 MG IV SOLR
INTRAVENOUS | Status: AC
  Filled 2021-10-04: qty 20

## 2021-10-04 MED ORDER — DOCUSATE SODIUM 100 MG PO CAPS
100.0000 mg | ORAL_CAPSULE | Freq: Two times a day (BID) | ORAL | Status: DC
  Administered 2021-10-04 – 2021-10-09 (×10): 100 mg via ORAL
  Filled 2021-10-04 (×10): qty 1

## 2021-10-04 MED ORDER — PROPOFOL 10 MG/ML IV BOLUS
INTRAVENOUS | Status: DC | PRN
Start: 2021-10-04 — End: 2021-10-04
  Administered 2021-10-04: 100 mg via INTRAVENOUS

## 2021-10-04 MED ORDER — EPHEDRINE SULFATE-NACL 50-0.9 MG/10ML-% IV SOSY
PREFILLED_SYRINGE | INTRAVENOUS | Status: DC | PRN
  Administered 2021-10-04 (×3): 2.5 mg via INTRAVENOUS

## 2021-10-04 MED ORDER — MENTHOL 3 MG MT LOZG: 1.0000 | LOZENGE | OROMUCOSAL | Status: DC | PRN

## 2021-10-04 MED ORDER — EPHEDRINE 5 MG/ML INJ
INTRAVENOUS | Status: AC
  Filled 2021-10-04: qty 5

## 2021-10-04 MED ORDER — HYDROCODONE-ACETAMINOPHEN 7.5-325 MG PO TABS
1.0000 | ORAL_TABLET | ORAL | Status: DC | PRN
  Administered 2021-10-05 – 2021-10-07 (×2): 1 via ORAL
  Filled 2021-10-04 (×2): qty 1

## 2021-10-04 MED ORDER — ONDANSETRON HCL 4 MG/2ML IJ SOLN
INTRAMUSCULAR | Status: AC
  Filled 2021-10-04: qty 2

## 2021-10-04 MED ORDER — 0.9 % SODIUM CHLORIDE (POUR BTL) OPTIME
TOPICAL | Status: DC | PRN
  Administered 2021-10-04: 1000 mL

## 2021-10-04 MED ORDER — ACETAMINOPHEN 500 MG PO TABS
500.0000 mg | ORAL_TABLET | Freq: Four times a day (QID) | ORAL | Status: AC
  Administered 2021-10-05 (×2): 500 mg via ORAL
  Filled 2021-10-04 (×3): qty 1

## 2021-10-04 MED ORDER — LIDOCAINE 2% (20 MG/ML) 5 ML SYRINGE
INTRAMUSCULAR | Status: AC
  Filled 2021-10-04: qty 5

## 2021-10-04 MED ORDER — PROPOFOL 500 MG/50ML IV EMUL
INTRAVENOUS | Status: DC | PRN
  Administered 2021-10-04: 75 ug/kg/min via INTRAVENOUS

## 2021-10-04 MED ORDER — PROPOFOL 10 MG/ML IV BOLUS
INTRAVENOUS | Status: AC
  Filled 2021-10-04: qty 20

## 2021-10-04 MED ORDER — FENTANYL CITRATE (PF) 100 MCG/2ML IJ SOLN: 25.0000 ug | INTRAMUSCULAR | Status: DC | PRN

## 2021-10-04 MED ORDER — DEXAMETHASONE SODIUM PHOSPHATE 10 MG/ML IJ SOLN
INTRAMUSCULAR | Status: AC
  Filled 2021-10-04: qty 1

## 2021-10-04 MED ORDER — VANCOMYCIN HCL 1000 MG IV SOLR
INTRAVENOUS | Status: DC | PRN
Start: 2021-10-04 — End: 2021-10-04
  Administered 2021-10-04: 1000 mg

## 2021-10-04 MED ORDER — ONDANSETRON HCL 4 MG PO TABS: 4.0000 mg | ORAL_TABLET | Freq: Four times a day (QID) | ORAL | Status: DC | PRN

## 2021-10-04 MED ORDER — LIDOCAINE 2% (20 MG/ML) 5 ML SYRINGE
INTRAMUSCULAR | Status: DC | PRN
  Administered 2021-10-04: 60 mg via INTRAVENOUS

## 2021-10-04 MED ORDER — ENOXAPARIN SODIUM 30 MG/0.3ML IJ SOSY
30.0000 mg | PREFILLED_SYRINGE | Freq: Two times a day (BID) | INTRAMUSCULAR | Status: DC
  Administered 2021-10-05 – 2021-10-07 (×5): 30 mg via SUBCUTANEOUS
  Filled 2021-10-04 (×6): qty 0.3

## 2021-10-04 MED ORDER — HYDROCODONE-ACETAMINOPHEN 5-325 MG PO TABS
1.0000 | ORAL_TABLET | ORAL | Status: DC | PRN
  Administered 2021-10-06 – 2021-10-08 (×3): 1 via ORAL
  Filled 2021-10-04 (×3): qty 1

## 2021-10-04 MED ORDER — MORPHINE SULFATE (PF) 2 MG/ML IV SOLN
0.5000 mg | INTRAVENOUS | Status: DC | PRN
  Administered 2021-10-07: 1 mg via INTRAVENOUS
  Filled 2021-10-04: qty 1

## 2021-10-04 MED ORDER — CHLORHEXIDINE GLUCONATE 0.12 % MT SOLN: 15.0000 mL | Freq: Once | OROMUCOSAL | Status: AC

## 2021-10-04 MED ORDER — TRANEXAMIC ACID-NACL 1000-0.7 MG/100ML-% IV SOLN: 1000.0000 mg | Freq: Once | INTRAVENOUS | Status: DC

## 2021-10-04 MED ORDER — ORAL CARE MOUTH RINSE: 15.0000 mL | Freq: Once | OROMUCOSAL | Status: AC

## 2021-10-04 MED ORDER — PHENOL 1.4 % MT LIQD: 1.0000 | OROMUCOSAL | Status: DC | PRN

## 2021-10-04 MED ORDER — SUGAMMADEX SODIUM 200 MG/2ML IV SOLN
INTRAVENOUS | Status: DC | PRN
  Administered 2021-10-04: 200 mg via INTRAVENOUS

## 2021-10-04 MED ORDER — ROCURONIUM BROMIDE 10 MG/ML (PF) SYRINGE
PREFILLED_SYRINGE | INTRAVENOUS | Status: AC
  Filled 2021-10-04: qty 10

## 2021-10-04 MED ORDER — PHENYLEPHRINE HCL-NACL 20-0.9 MG/250ML-% IV SOLN
INTRAVENOUS | Status: DC | PRN
  Administered 2021-10-04: 40 ug/min via INTRAVENOUS

## 2021-10-04 MED ORDER — DEXAMETHASONE SODIUM PHOSPHATE 10 MG/ML IJ SOLN
INTRAMUSCULAR | Status: DC | PRN
Start: 2021-10-04 — End: 2021-10-04
  Administered 2021-10-04: 5 mg via INTRAVENOUS

## 2021-10-04 MED ORDER — ONDANSETRON HCL 4 MG/2ML IJ SOLN: 4.0000 mg | Freq: Four times a day (QID) | INTRAMUSCULAR | Status: DC | PRN

## 2021-10-04 MED ORDER — CEFAZOLIN SODIUM-DEXTROSE 2-4 GM/100ML-% IV SOLN
2.0000 g | INTRAVENOUS | Status: AC
  Administered 2021-10-04: 2 g via INTRAVENOUS

## 2021-10-04 MED ORDER — ACETAMINOPHEN 325 MG PO TABS
325.0000 mg | ORAL_TABLET | Freq: Four times a day (QID) | ORAL | Status: DC | PRN
  Administered 2021-10-08: 650 mg via ORAL
  Filled 2021-10-04: qty 2

## 2021-10-04 MED ORDER — LACTATED RINGERS IV SOLN: INTRAVENOUS | Status: DC

## 2021-10-04 MED ORDER — CEFAZOLIN SODIUM-DEXTROSE 2-4 GM/100ML-% IV SOLN
INTRAVENOUS | Status: AC
  Filled 2021-10-04: qty 100

## 2021-10-04 MED ORDER — PROPOFOL 1000 MG/100ML IV EMUL
INTRAVENOUS | Status: AC
  Filled 2021-10-04: qty 100

## 2021-10-04 MED ORDER — TRANEXAMIC ACID-NACL 1000-0.7 MG/100ML-% IV SOLN
INTRAVENOUS | Status: AC
  Filled 2021-10-04: qty 100

## 2021-10-04 MED ORDER — ONDANSETRON HCL 4 MG/2ML IJ SOLN
INTRAMUSCULAR | Status: DC | PRN
  Administered 2021-10-04: 4 mg via INTRAVENOUS

## 2021-10-04 MED ORDER — FENTANYL CITRATE (PF) 250 MCG/5ML IJ SOLN
INTRAMUSCULAR | Status: DC | PRN
  Administered 2021-10-04 (×2): 50 ug via INTRAVENOUS
  Administered 2021-10-04: 25 ug via INTRAVENOUS

## 2021-10-04 MED ORDER — CEFAZOLIN SODIUM-DEXTROSE 2-4 GM/100ML-% IV SOLN
2.0000 g | Freq: Four times a day (QID) | INTRAVENOUS | Status: AC
  Administered 2021-10-04 – 2021-10-05 (×2): 2 g via INTRAVENOUS
  Filled 2021-10-04 (×2): qty 100

## 2021-10-04 MED ORDER — TRANEXAMIC ACID-NACL 1000-0.7 MG/100ML-% IV SOLN
INTRAVENOUS | Status: DC | PRN
  Administered 2021-10-04: 1000 mg via INTRAVENOUS

## 2021-10-04 MED ORDER — POVIDONE-IODINE 10 % EX SWAB: 2.0000 "application " | Freq: Once | CUTANEOUS | Status: DC

## 2021-10-04 SURGICAL SUPPLY — 51 items
ADH SKN CLS APL DERMABOND .7 (GAUZE/BANDAGES/DRESSINGS) ×2
BAG COUNTER SPONGE SURGICOUNT (BAG) ×3 IMPLANT
BAG SPNG CNTER NS LX DISP (BAG) ×1
BIT DRILL CANN 16 HIP (BIT) ×1 IMPLANT
BIT DRILL CANN STP 6/9 HIP (BIT) ×1 IMPLANT
BIT DRILL SHORT 4.2 (BIT) IMPLANT
BNDG COHESIVE 6X5 TAN NS LF (GAUZE/BANDAGES/DRESSINGS) ×3 IMPLANT
CANISTER WOUNDNEG PRESSURE 500 (CANNISTER) ×1 IMPLANT
COVER PERINEAL POST (MISCELLANEOUS) ×3 IMPLANT
COVER SURGICAL LIGHT HANDLE (MISCELLANEOUS) ×3 IMPLANT
DERMABOND ADVANCED (GAUZE/BANDAGES/DRESSINGS) ×2
DERMABOND ADVANCED .7 DNX12 (GAUZE/BANDAGES/DRESSINGS) ×4 IMPLANT
DRAPE C-ARM 42X72 X-RAY (DRAPES) ×3 IMPLANT
DRAPE C-ARMOR (DRAPES) ×3 IMPLANT
DRAPE STERI IOBAN 125X83 (DRAPES) ×3 IMPLANT
DRESSING PREVENA PLUS CUSTOM (GAUZE/BANDAGES/DRESSINGS) IMPLANT
DRILL BIT SHORT 4.2 (BIT) ×2
DRSG MEPILEX BORDER 4X8 (GAUZE/BANDAGES/DRESSINGS) ×4 IMPLANT
DRSG PREVENA PLUS CUSTOM (GAUZE/BANDAGES/DRESSINGS) ×2
ELECT REM PT RETURN 9FT ADLT (ELECTROSURGICAL) ×2
ELECTRODE REM PT RTRN 9FT ADLT (ELECTROSURGICAL) ×2 IMPLANT
GLOVE SRG 8 PF TXTR STRL LF DI (GLOVE) ×2 IMPLANT
GLOVE SURG ORTHO LTX SZ7.5 (GLOVE) ×6 IMPLANT
GLOVE SURG UNDER POLY LF SZ8 (GLOVE) ×2
GOWN STRL REUS W/ TWL LRG LVL3 (GOWN DISPOSABLE) ×2 IMPLANT
GOWN STRL REUS W/ TWL XL LVL3 (GOWN DISPOSABLE) ×2 IMPLANT
GOWN STRL REUS W/TWL LRG LVL3 (GOWN DISPOSABLE) ×2
GOWN STRL REUS W/TWL XL LVL3 (GOWN DISPOSABLE) ×2
GUIDEWIRE 3.2X400 (WIRE) ×1 IMPLANT
KIT BASIN OR (CUSTOM PROCEDURE TRAY) ×3 IMPLANT
KIT DRSG PREVENA PLUS 7DAY 125 (MISCELLANEOUS) ×1 IMPLANT
NAIL CANN 10X360MM RT STERILE (Nail) ×1 IMPLANT
NS IRRIG 1000ML POUR BTL (IV SOLUTION) ×3 IMPLANT
PACK GENERAL/GYN (CUSTOM PROCEDURE TRAY) ×3 IMPLANT
PAD ARMBOARD 7.5X6 YLW CONV (MISCELLANEOUS) ×6 IMPLANT
REAMER ROD 3.8 BALL TIP 3X950 (ORTHOPEDIC DISPOSABLE SUPPLIES) ×1 IMPLANT
SCREW LAG TFNA 90 HIP (Screw) ×1 IMPLANT
SCREW LOCK IM 5X36 (Screw) ×2 IMPLANT
SCREW LOCK IM 5X44 (Screw) ×1 IMPLANT
SCREW LOCK X25 36X5X IM NL (Screw) IMPLANT
SPONGE T-LAP 18X18 ~~LOC~~+RFID (SPONGE) ×6 IMPLANT
SUT FIBERWIRE #5 38 CONV NDL (SUTURE) ×4
SUT MNCRL+ AB 3-0 CT1 36 (SUTURE) ×4 IMPLANT
SUT MON AB 2-0 CT1 36 (SUTURE) ×7 IMPLANT
SUT MONOCRYL AB 3-0 CT1 36IN (SUTURE) ×2
SUT PDS AB 1 CT  36 (SUTURE) ×4
SUT PDS AB 1 CT 36 (SUTURE) ×4 IMPLANT
SUT VIC AB 0 CT1 27 (SUTURE) ×4
SUT VIC AB 0 CT1 27XBRD ANBCTR (SUTURE) ×4 IMPLANT
SUTURE FIBERWR #5 38 CONV NDL (SUTURE) IMPLANT
TOWEL GREEN STERILE (TOWEL DISPOSABLE) ×6 IMPLANT

## 2021-10-04 NOTE — Anesthesia Procedure Notes (Addendum)
Procedure Name: Intubation Date/Time: 10/04/2021 1:59 PM Performed by: Amadeo Garnet, CRNA Pre-anesthesia Checklist: Patient identified, Emergency Drugs available, Suction available and Patient being monitored Patient Re-evaluated:Patient Re-evaluated prior to induction Oxygen Delivery Method: Circle system utilized Preoxygenation: Pre-oxygenation with 100% oxygen Induction Type: IV induction Ventilation: Mask ventilation without difficulty Laryngoscope Size: Glidescope and 4 Grade View: Grade I Tube type: Oral Tube size: 7.0 mm Number of attempts: 1 Airway Equipment and Method: Stylet and Oral airway Placement Confirmation: ETT inserted through vocal cords under direct vision, positive ETCO2 and breath sounds checked- equal and bilateral Secured at: 21 cm Tube secured with: Tape Dental Injury: Teeth and Oropharynx as per pre-operative assessment  Comments: DL x1 with MAC3, unable to visualize cords or arytenoids, decision made to use glidescope.

## 2021-10-04 NOTE — Anesthesia Procedure Notes (Signed)
Spinal  Patient location during procedure: OR Start time: 10/04/2021 1:55 PM End time: 10/04/2021 2:04 PM Reason for block: surgical anesthesia Staffing Performed: anesthesiologist  Anesthesiologist: Lyn Hollingshead, MD Preanesthetic Checklist Completed: patient identified, IV checked, site marked, risks and benefits discussed, surgical consent, monitors and equipment checked, pre-op evaluation and timeout performed Spinal Block Patient position: sitting Prep: DuraPrep and site prepped and draped Patient monitoring: continuous pulse ox and blood pressure Approach: midline Location: L3-4 Injection technique: single-shot Needle Needle type: Pencan  Needle gauge: 24 G Needle length: 10 cm Needle insertion depth: 6 cm Assessment Events: failed spinal

## 2021-10-04 NOTE — Discharge Instructions (Addendum)
Discharge instructions for Dr. [name], M.D., Orthopaedic Surgeon, [place]:  Diet: As you were doing prior to hospitalization, unless instructed otherwise by medical team, dietary/nutrition team, etc. Shower:  Unless otherwise specified, may shower but keep the wounds dry, use an occlusive plastic wrap, NO SOAKING IN TUB.  If the bandage gets wet, change with a clean dry gauze.  Unless otherwise specified, after 5 days dressing(s) should be removed and wound(s) may get wet in the shower by allowing water to gently run over.  Again, no soaking in tub, and do NOT submerge for at least 2 weeks!!! Dressing:  The PREVENA incisional VAC (iVAC) should be left on for 7 days and then removed.  After that the wounds may be dressed with dry sterile gauze dressing and Tegaderm (clear adhesive plastic squares).  If the PREVENA hose is connected to a large VAC machine in the hospital, this should be switched to the small portable PREVENA unit prior to discharge.  Please make sure a responsible party understands how to operate the [facility] unit before discharge, including how to charge the unit.  Please note that the [facility] dressing will ONLY work if connected to suction!  If the [facility] unit fails and cannot be fixed/restored to normal operation to restore suction, the sponge and plastic dressing should be removed and replaced with gauze/Tegaderm.  If the dressing loses suction due to a leak attempt to seal the leak with Tegaderm or remove the sponge plastic dressing replace with gauze/Tegaderm. Activity:  Increase activity slowly as tolerated.  If you right leg is injured or immobilized, no driving for 6 weeks or until discussed with your surgeon.  If you have an injury or immobilization of the left lower extremity you may not operate a clutch. Please note that driving with any kind of immobilization for the upper extremity (sling, shoulder brace, splint, cast, etc.) may also be  considered impaired driving and should not be attempted. Weight Bearing: WEIGHTBEARING AS TOLERATED (WBAT) on the RIGHT LOWER EXTREMITY. To prevent constipation: You may use over-the-counter stool softener(s) such as Colace (over the counter) 100 mg by mouth twice a day and/or Miralax (over the counter) for constipation as needed.  Drink plenty of fluids (prune juice may be helpful) and high fiber foods.  Itching:  If you experience itching with your medications, try taking only a single pain pill, or even half a pain pill at a time.  You can also use benadryl over the counter for itching or also to help with sleep.  Precautions:  If you experience chest pain or shortness of breath - call 911 immediately for transfer to the hospital emergency department!! Medications: Please contact the clinic during office hours (Monday through Friday, 0800 to 1600) if you need a refill on any medications.  Please monitor medications and allow 24 to 48 hours to process refill request!!!!  Please note that only medications directly related to the surgery can be prescribed.  For other medications (e.g. blood pressure medicines, sleeping medicines, etc.), please contact the prescribing physician or your primary care provider. DVT Prophylaxis: Lovenox 30 mg BID x6 weeks  If you develop a fever greater that 101.1 deg F, purulent drainage from wound, increased redness or drainage from wound, or calf pain -- Call the office at [phone].

## 2021-10-04 NOTE — Transfer of Care (Signed)
Immediate Anesthesia Transfer of Care Note  Patient: [name]  Procedure(s) Performed: INTRAMEDULLARY (IM) NAIL INTERTROCHANTRIC (Right)  Patient Location: PACU  Anesthesia Type:General  Level of Consciousness: awake  Airway & Oxygen Therapy: Patient Spontanous Breathing  Post-op Assessment: Report given to RN and Post -op Vital signs reviewed and stable  Post vital signs: Reviewed and stable  Last Vitals:  Vitals Value Taken Time  BP 125/94 10/04/21 1757  Temp    Pulse 95 10/04/21 1800  Resp 25 10/04/21 1800  SpO2 95 % 10/04/21 1800  Vitals shown include unvalidated device data.  Last Pain:  Vitals:   10/04/21 1304  TempSrc:   PainSc: 0-No pain         Complications: No notable events documented.

## 2021-10-04 NOTE — Progress Notes (Signed)
°  Progress Note   Patient: [name]:[id number] DOB: 1941/06/30 DOA: 10/02/2021     2 DOS: the patient was seen and examined on 10/04/2021   Brief hospital course: Patient is a 81 year old female with history of chronic cold agglutinin hemolytic anemia, dementia presented with fall 3 days ago.  Patient was complaining of hip and chest pain 2 days later.  X-ray showed right hip fracture.  CT head also showed small intraventricular hemorrhage Dr. Hal Hope discussed with Dr. Venetia Constable, neurosurgery, at this time no neurosurgical interventions needed.  1/11: Received 2 units packed RBCs (warm) for hemoglobin of 6.9 1/12: Overnight agitated, will resume Seroquel.  H&H stable, plan for or today  Assessment and Plan * Closed right hip fracture ([place])- (present on admission) - Secondary to mechanical fall -- Plan for the OR today -Continue pain control, DVT prophylaxis, PT evaluation after surgery  Intraventricular hemorrhage ([place]) - Due to fall.  CT head showed small intraventricular hemorrhage.  Admitting physician, Dr. Hal Hope discussed with Dr. Venetia Constable (neurosurgery) who recommended no intervention needed at this time.  -Stable  Symptomatic anemia- (present on admission) - Hemoglobin 6.9, on 1/11 possibly due to the fracture, intraventricular hemorrhage, discussed with hematology, Dr Irene Limbo -Has chronic cold agglutinin hemolytic anemia.  Recommended 2 units packed RBC transfusion, to keep hemoglobin above 9, warmed.  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  Has been following with Dr. Irene Limbo -Status post 2 units packed RBC transfusion, hemoglobin stable at 10.9 today  COPD (chronic obstructive pulmonary disease) ([place])- (present on admission) - No wheezing, currently stable -Chest x-ray showed  stable cardiomegaly, left basilar atelectasis and/or small pleural effusion  Fall- (present on admission) - PT OT evaluation after surgery  Dementia ([place])- (present on admission) - Overnight had agitation, did not sleep, sitter at the bedside today. -Resume Seroquel 25 mg at bedtime (8PM nightly)    Subjective: Pleasantly confused, sitter at the bedside, states she did not sleep well last night.  No acute issues.  Objective BP (!) 142/39    Pulse 73    Temp 98 F (36.7 C) (Oral)    Resp 17    Ht 5\' 1"  (1.549 m)    Wt 42.6 kg    SpO2 96%    BMI 17.76 kg/m    Physical Exam General: Alert and oriented to self, NAD, pleasantly confused pleasantly can Cardiovascular: S1 S2 clear, RRR. No pedal edema b/l Respiratory: CTAB, no wheezing, rales or rhonchi Gastrointestinal: Soft, nontender, nondistended, NBS Ext: no pedal edema bilaterally   Data Reviewed:  My review of labs, imaging, notes and other tests shows no new significant findings.   Family Communication: called patient's sister, [name] on phone and updated in detail. Lives at home PTA, was at [facility] from previous admission and was released from there on Dec 7th.     Disposition: Status is: Inpatient  Remains inpatient appropriate because: OR today,  DVT prophylaxis: SCDs   Time spent: 35 minutes  Author: Estill Cotta, MD 10/04/2021 2:23 PM  For on call review [email or web address].

## 2021-10-04 NOTE — Anesthesia Preprocedure Evaluation (Addendum)
Anesthesia Evaluation  Patient identified by MRN, date of birth, ID band Patient awake    Reviewed: Allergy & Precautions, NPO status , Patient's Chart, lab work & pertinent test results  Airway Mallampati: I       Dental no notable dental hx.    Pulmonary COPD, former smoker,    Pulmonary exam normal        Cardiovascular + Peripheral Vascular Disease  Normal cardiovascular exam     Neuro/Psych PSYCHIATRIC DISORDERS Dementia negative neurological ROS     GI/Hepatic negative GI ROS, Neg liver ROS,   Endo/Other  negative endocrine ROS  Renal/GU   negative genitourinary   Musculoskeletal   Abdominal Normal abdominal exam  (+)   Peds  Hematology  (+) anemia ,   Anesthesia Other Findings   Reproductive/Obstetrics                            Anesthesia Physical Anesthesia Plan  ASA: 2  Anesthesia Plan: Spinal and General   Post-op Pain Management:    Induction: Intravenous  PONV Risk Score and Plan: Propofol infusion  Airway Management Planned: Natural Airway, Simple Face Mask and Oral ETT  Additional Equipment: None  Intra-op Plan:   Post-operative Plan: Extubation in OR  Informed Consent: I have reviewed the patients History and Physical, chart, labs and discussed the procedure including the risks, benefits and alternatives for the proposed anesthesia with the patient or authorized representative who has indicated his/her understanding and acceptance.     Dental advisory given and Consent reviewed with POA  Plan Discussed with: CRNA  Anesthesia Plan Comments:       Anesthesia Quick Evaluation

## 2021-10-04 NOTE — H&P (Signed)
PREOPERATIVE H&P  HPI: [name] is a 81 y.o. female who has presented today for surgery, with the diagnosis of right peritrochanteric femur fracture.  The various methods of treatment have been discussed with the patient and family.  After consideration of risks, benefits, and other options for treatment, the patient has consented to INTRAMEDULLARY (IM) NAIL INTERTROCHANTRIC as a surgical intervention.  The patient's history has been reviewed, patient examined, no change in status, stable for surgery.  I have reviewed the patient's chart and labs.  Questions were answered to the patient's satisfaction.    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   Current Outpatient Medications on File Prior to Encounter  Medication Sig Dispense Refill   acetaminophen (TYLENOL) 500 MG tablet Take 500 mg by mouth every 6 (six) hours as needed for moderate pain.     ALPRAZolam (XANAX) 0.5 MG tablet Take 0.5 mg by mouth at bedtime.     furosemide (LASIX) 20 MG tablet Take 20 mg by mouth daily.     KLOR-CON M20 20 MEQ tablet Take 20 mEq by mouth daily.     loratadine (CLARITIN) 10 MG tablet Take 10 mg by mouth daily as needed for allergies.     vitamin B-12 2000 MCG tablet Take 1 tablet (2,000 mcg total) by mouth daily. 30 tablet 2   QUEtiapine (SEROQUEL) 25 MG tablet Take 1 tablet (25 mg total) by mouth at bedtime. (Patient not taking:  Reported on 10/03/2021) 30 tablet 0   Allergies  Allergen Reactions   Other Other (See Comments)    "Most antibiotics give yeast infection"   Shellfish Allergy Hives     PSH: Past Surgical History:  Procedure Laterality Date   ABDOMINAL HYSTERECTOMY  1986   APPENDECTOMY  1967   BREAST EXCISIONAL BIOPSY Right 1984   CATARACT EXTRACTION W/ INTRAOCULAR LENS IMPLANT Right 1990's   congential cataract   CYSTOSCOPY W/ URETERAL STENT PLACEMENT  11/12/2015   Procedure: CYSTOSCOPY WITH RETROGRADE PYELOGRAM/URETERAL STENT PLACEMENT;  Surgeon: [name], MD;  Location: [facility];  Service: Urology;;   CYSTOSCOPY W/ URETERAL STENT REMOVAL Left 11/22/2015   Procedure: CYSTOSCOPY WITH STENT REMOVAL;  Surgeon: [name], MD;  Location[facility];  Service: Urology;  Laterality: Left;   CYSTOSCOPY/URETEROSCOPY/HOLMIUM LASER Left 11/22/2015   Procedure: CYSTOSCOPY/URETEROSCOPY/HOLMIUM LASER;  Surgeon: [name], MD;  Location: [facility];  Service: Urology;  Laterality: Left;  FLEX URETEROSCOPY   FEMUR IM NAIL Left 08/04/2015   Procedure: INTRAMEDULLARY (IM) NAIL FEMORAL;  Surgeon: [name], MD;  Location: [place];  Service: Orthopedics;  Laterality: Left;   IR KYPHO THORACIC WITH BONE BIOPSY  06/22/2018   TONSILLECTOMY  age 23     Family History Social History  Family History  Problem Relation Age of Onset   Breast cancer Cousin  Social History   Socioeconomic History   Marital status: Divorced    Spouse name: Not on file   Number of children: Not on file   Years of education: Not on file   Highest education level: Not on file  Occupational History   Not on file  Tobacco Use   Smoking status: Former    Packs/day: 1.00    Types: Cigarettes    Quit date: 08/03/2015    Years since quitting: 6.1   Smokeless tobacco: Never  Vaping Use   Vaping Use: Never used  Substance and Sexual Activity   Alcohol use: Yes    Comment: occ   Drug use: Never    Sexual activity: Not Currently  Other Topics Concern   Not on file  Social History Narrative   Not on file   Social Determinants of Health   Financial Resource Strain: Not on file  Food Insecurity: Not on file  Transportation Needs: Not on file  Physical Activity: Not on file  Stress: Not on file  Social Connections: Not on file     Review of Systems: MSK: As noted per HPI above GI: No current Nausea/vomiting ENT: Denies sore throat, epistaxis CV: Denies chest pain Resp: No current shortness of breath  Other than mentioned above, there are no Constitutional, Neurological, Psychiatric, ENT, Ophthalmological, Cardiovascular, Respiratory, GI, GU, Musculoskeletal, Integumentary, Lymphatic, Endocrine or Allergic issues.   Physical Examination: CV: Normal distal pulses Lungs: Unlabored respirations RLE: Shortened.  Pain with PROM at hip.  Unable to participate with motor or sensory exam due to mental status.  Assessment/Plan: INTRAMEDULLARY (IM) NAIL INTERTROCHANTRIC    [name] M.D. Orthopaedic Surgery [facility]  Review of this patient's medications prescribed by other providers does not in any way constitute an endorsement by this clinician of their use, indications, dosage, route, efficacy, interactions, or other clinical parameters.  Portions of the record have been created with voice recognition software.  Grammatical and punctuation errors, random word insertions, wrong-word or "sound-a-like" substitutions, pronoun errors (inaccuracies and/or substitutions), and/or incomplete sentences may have occurred due to the inherent limitations of voice recognition software.  Not all errors are caught or corrected.  Although every attempt is made to root out erroneous and incomplete transcription, the note may still not fully represent the intent or opinion of the author.  Read the chart carefully and recognize, using context, where  errors/substitutions have occurred.  Any questions or concerns about the content of this note or information contained within the body of this dictation should be addressed directly with the author for clarification.

## 2021-10-04 NOTE — Op Note (Signed)
OPERATIVE NOTE  [name] female 81 y.o. 10/04/2021  PREOPERATIVE DIAGNOSIS: Right comminuted peritrochanteric femur fracture Insufficiency fracture of right proximal femur due to osteoporosis  POSTOPERATIVE DIAGNOSIS: Right comminuted peritrochanteric femur fracture Insufficiency fracture of right proximal femur due to osteoporosis  PROCEDURE(S): Open treatment of right peritrochanteric femur fracture with cephalomedullary implant (53976) Placement negative pressure wound therapy device (73419) Operative use of fluoroscopy for above procedure(s) (37902)   SURGEON: [name], M.D.  ASSISTANT(S): None  ANESTHESIA: Choice  FINDINGS: Preoperative Examination: Previously ambulatory patient with dementia unable to bear weight after mechanical fall.  Unable to tolerate any passive motion of the right hip due to pain.  Unable to participate in sensorimotor exam due to mental status.  Multiple blisters and excoriations present on the right leg, with some chronic skin changes.  Operative Findings: Extensively comminuted unstable peritrochanteric fracture of the right proximal femur.  Very soft osteoporotic bone.  Edematous, weepy tissues.  Restoration of neck-shaft alignment relationship, maintained with cephalomedullary fixation including 2 distal interlock screws.  IMPLANTS: Implant Name Type Inv. Item Serial No. Manufacturer Lot No. LRB No. Used Action  NAIL CANN 10X360MM RT STERILE - [id number] Nail NAIL CANN 10X360MM RT STERILE  [employer] [id number] Right 1 Implanted  SCREW LAG TFNA 90 HIP - [id number] Screw SCREW LAG TFNA 90 HIP  [employer]PU[employer]TH[employer]  Right 1 Implanted  SCREW LOCK IM 5X44 - [id number] Screw SCREW LOCK IM 5X44  [employer]PU[employer]TH[employer] [id number] Right 1 Implanted  SCREW LOCK IM 5X36 - [id number] Screw SCREW LOCK IM 5X36  [employer]PU[employer]TH[employer] [id number] Right 1 Implanted    INDICATIONS:  The patient is a 81 y.o. female who according to her sister was ambulatory  before her most recent injury.  She sustained a mechanical fall and was unable to bear weight on the right lower extremity following the injury.  She is brought to the emergency room where she was found to have a comminuted right peritrochanteric fracture.  In order to promote mobility and decreased risks [place] aided with prolonged bedrest immobilization, she was recommended to undergo open treatment with cephalomedullary nail fixation.  Consent was obtained from the patient's sister.  She understood the risks, benefits and alternatives to surgery which include but are not limited to bleeding, wound healing complications, infection, damage to surrounding structures, persistent pain, stiffness, lack of improvement, potential for subsequent arthritis or worsening of pre-existing arthritis, nonunion, malunion, and need for further surgery, as well as complications related to anesthesia, cardiovascular complications, and death.  She also understood the potential for continued pain in that there were no guarantees of acceptable outcome.  After weighing these risks the patient opted to proceed with surgery.  TECHNIQUE: Patient was identified in the preoperative holding area.  The right hip and thigh was marked by myself.  Consent was signed by myself and the patient.  No block was performed by anesthesia in the preoperative holding area.  Patient was taken to the operative suite and placed supine on the operative table.  Anesthesia was induced by the anesthesia team.  The patient was positioned appropriately for the procedure and all bony prominences were well padded.  A tourniquet was not used.  Preoperative antibiotics were given. The extremity was prepped and draped in the usual sterile fashion and surgical timeout was performed.  Close reduction was attempted on the [place] table, but complete reduction of the comminuted fracture was not possible through ligamentotaxis, as there was too much comminution.  Limited  lateral exposure was  made on the lateral femur in order to allow access to the fracture.  Skin was incised sharply.  Underlying subcutaneous tissues were dissected with electrocautery.  Fascia was divided sharply in line with fibers.  Percutaneous reduction was attempted through a limited lateral exposure; however, there were many comminuted fragments which were not amenable to reduction by this means, as the reduction instruments resulted in further fracture of the fragments.  Using a commendation of traction and some reduction of the more stout fragments through the lateral exposure, the neck-shaft alignment relationship was restored and confirmed radiographically on orthogonal views.  The lateral exposure was extended proximally.  Skin was incised sharply.  Underlying subcutaneous tissues were dissected with electrocautery.  Fascia was incised sharply lateral fibers.  Comminution extends up to the trochanteric starting point, so the entry awl was used to cannulate the region of the starting point and passed the starting wire down the proximal femur.  Intramedullary position of the wire was confirmed on orthogonal AP and lateral views.  The proximal femur was cannulated at the trochanteric starting point with the flexible starting reamer.  Starting reamer and starting wire were withdrawn, and a ball-tipped guidewire was guided down the center of the intramedullary canal down to the level of the knee.  Measurement for the nail was obtained.  Femur was sequentially reamed over the guidewire up to 11.5 mm in order to accommodate a 10 mm diameter nail.  There was good chatter at the isthmus.  Nail was impacted into position over the guidewire, the guidewire was withdrawn.  The triple sleeve trocar for the head screw was advanced into position on the lateral femur.  Head screw guidewire was advanced up the femoral neck into the femoral head, avoiding subchondral penetration.  Appropriate position was confirmed on  orthogonal AP and lateral views fluoroscopically.  Cannulated drill was advanced over the guidewire for the screw, and a 90 mm screw was placed over the wire.  Given the extent of the comminution proximally, I elected to place 2 interlocking screws distally.  This was performed with perfect circle technique in the static holes.  The region statical's was localized on the skin using the perfect circle technique fluoroscopically and skin was incised sharply.  The IT band was incised sharply in line with these incisions, and 2 distal interlock screws were placed through the nail.  Wounds were copiously irrigated and hemostasis was obtained.  Proximally, there were several bony shell fragments of the peritrochanteric region which appeared to have been avulsed by muscular or other soft tissue attachments.  These were sutured back around the proximal femur with #5 FiberWire.  Vancomycin powder was placed in all the wounds.  Fascia was closed with interrupted figure-of-eight #1 PDS suture.  Proximally the deep fat was closed with inverted interrupted simple 0 Vicryl.  Deep dermal layer was closed with interrupted simple 2-0 Monocryl sutures.  Skin was closed with running 3-0 Monocryl subcuticular with knots tied outside of the wounds.  Given the edematous appearance of the tissues intraoperatively, I elected to place an incisional VAC over the wounds in order to promote granulation and healing.  Patient was awakened from anesthesia and transferred to PACU in stable condition.  She tolerated procedure well.  There were no complications.  POST OPERATIVE INSTRUCTIONS: Mobility: Out of bed with PT/OT Pain control: Continue to wean/titrate to appropriate oral regimen DVT Prophylaxis: Lovenox 30 mg twice daily x6 weeks Further surgical plans: None RUE: No restrictions LUE: No restrictions RLE: Weightbearing  as tolerated LLE: No restrictions Disposition: Per primary team when medically appropriate Dressing care: The  PREVENA incisional VAC (iVAC) should be left on for 7 days and then removed.  After that the wounds may be dressed with dry sterile gauze dressing and Tegaderm (clear adhesive plastic squares).  If the PREVENA hose is connected to a large VAC machine in the hospital, this should be switched to the small portable PREVENA unit prior to discharge.  Please make sure a responsible party understands how to operate the [facility] unit before discharge, including how to charge the unit.  Please note that the [facility] dressing will ONLY work if connected to suction!  If the [facility] unit fails and cannot be fixed/restored to normal operation to restore suction, the sponge and plastic dressing should be removed and replaced with gauze/Tegaderm.  If the dressing loses suction due to a leak attempt to seal the leak with Tegaderm or remove the sponge plastic dressing replace with gauze/Tegaderm. Follow-up: Please call [facility] [phone]) to schedule follow-op appointment for 2 weeks after surgery. I have verified that my discharge instructions and follow-up information has been entered in the Discharge Navigator in Epic.  These should automatically populate in the AVS.  Please print the AVS in its entirety and ensure that the patient or a responsible party has a complete copy of the AVS before they are discharged.  If there are questions regarding discharge instructions or follow-up before the AVS is generated, please check the Discharge Navigator before attempting to contact the surgeon/office.  If unsure how to access the Discharge Navigator or the information contained in the Discharge Navigator, or how to generate/print the AVS, please contact the appropriate Nurse, learning disability.   TOURNIQUET TIME: N/A  BLOOD LOSS: 150 mL         DRAINS: none         SPECIMEN: none       COMPLICATIONS:  * No complications entered in OR log *         DISPOSITION: PACU - hemodynamically stable.          CONDITION: stable   [name] M.D. Orthopaedic Surgery [facility]   Portions of the record have been created with voice recognition software.  Grammatical and punctuation errors, random word insertions, wrong-word or "sound-a-like" substitutions, pronoun errors (inaccuracies and/or substitutions), and/or incomplete sentences may have occurred due to the inherent limitations of voice recognition software.  Not all errors are caught or corrected.  Although every attempt is made to root out erroneous and incomplete transcription, the note may still not fully represent the intent or opinion of the author.  Read the chart carefully and recognize, using context, where errors/substitutions have occurred.  Any questions or concerns about the content of this note or information contained within the body of this dictation should be addressed directly with the author for clarification.

## 2021-10-04 NOTE — Progress Notes (Signed)
Administed Mupirocin due to shell fish allergy.

## 2021-10-04 NOTE — TOC Initial Note (Addendum)
Transition of Care [facility]) - Initial/Assessment Note    Patient Details  Name: [name] MRN: [id number] Date of Birth: 04/28/1941  Transition of [facility]) CM/SW Contact:    Sharin Mons, RN Phone Number: 10/04/2021, 11:56 AM  Clinical Narrative:        Admitted with R hip fx , s/p fall. NCM @ bedside with pt  and pt's sister [name]. Pt confused , hx of dementia.  Per [name] pt is from home with 43 y/o mother who is pt's caregiver.  [name] states  she [name]) is  pt's POC  for all medical concerns.  [name] states pt with recent SNF visit @ [facility],and was d/c on 12/7 to home. States family would like pt to transition back to [facility] if needed @ d/c. States pt's mother will not be able to manage pt's care once d/c.  Plan : IMN with Dr. Mable Fill today  [facility] team following and will assist with TOC  needs.   10/05/2021 POD # 1, S/P Open treatment of right peritrochanteric femur fracture with cephalomedullary implant. PT evaluation pending.... TOC team will continue to monitor and assist with needs.  Expected Discharge Plan: Skilled Nursing Facility Barriers to Discharge: Continued Medical Work up   Patient Goals and CMS Choice        Expected Discharge Plan and Services Expected Discharge Plan: [place]   Discharge Planning Services: CM Consult   Living arrangements for the past 2 months: Single Family Home                                      Prior Living Arrangements/Services Living arrangements for the past 2 months: Single Family Home Lives with:: Parents (33 y/o mother) Patient language and need for interpreter reviewed:: Yes        Need for Family Participation in Patient Care: Yes (Comment) Care giver support system in place?: No (comment)   Criminal Activity/Legal Involvement Pertinent to Current Situation/Hospitalization: No - Comment as needed  Activities of Daily Living Home Assistive Devices/Equipment:  None (patient is unreliable reporter) ADL Screening (condition at time of admission) Patient's cognitive ability adequate to safely complete daily activities?: No Is the patient deaf or have difficulty hearing?: No Does the patient have difficulty seeing, even when wearing glasses/contacts?: No Does the patient have difficulty concentrating, remembering, or making decisions?: Yes Patient able to express need for assistance with ADLs?: No Does the patient have difficulty dressing or bathing?: Yes Independently performs ADLs?: No Communication: Independent Dressing (OT): Needs assistance Is this a change from baseline?: Pre-admission baseline Grooming: Needs assistance Is this a change from baseline?: Pre-admission baseline Feeding: Independent Bathing: Needs assistance Is this a change from baseline?: Pre-admission baseline Toileting: Needs assistance Is this a change from baseline?: Pre-admission baseline In/Out Bed: Needs assistance Is this a change from baseline?: Pre-admission baseline Walks in Home: Needs assistance Is this a change from baseline?: Pre-admission baseline Does the patient have difficulty walking or climbing stairs?: Yes Weakness of Legs: Both Weakness of Arms/Hands: Both  Permission Sought/Granted                  Emotional Assessment Appearance:: Appears stated age     Orientation: : Oriented to Self Alcohol / Substance Use: Not Applicable Psych Involvement: No (comment)  Admission diagnosis:  Hip fracture ([place]) [S72.009A] Patient Active Problem List   Diagnosis Date Noted  Dementia ([place]) 10/03/2021   Intraventricular hemorrhage ([place]) 10/03/2021   Hip fracture ([place]) 10/02/2021   Sepsis due to pneumonia ([place]) 08/10/2021   Sepsis due to undetermined organism ([place]) 08/09/2021   Aortic atherosclerosis ([place]) 08/09/2021   COPD (chronic obstructive pulmonary disease) ([facility])    Elevated transaminase level    Symptomatic anemia 05/28/2018    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   Occult blood in stools 02/04/2018   Ureteral calculus 11/12/2015   Breast cancer screening, high risk patient 08/29/2015   Acute delirium 08/07/2015   Postoperative anemia due to acute blood loss 08/06/2015   Protein-calorie malnutrition, severe 08/04/2015   Closed intertrochanteric fracture of left femur ([place]) 08/03/2015   Fall 08/03/2015   Closed right hip fracture ([place]) 08/03/2015   Hyperbilirubinemia 08/01/2013   Hypokalemia 07/30/2013   Vitamin B 12 deficiency 08/07/2012   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

## 2021-10-05 ENCOUNTER — Encounter (HOSPITAL_COMMUNITY): Payer: Self-pay | Admitting: Orthopedic Surgery

## 2021-10-05 DIAGNOSIS — S2232XA Fracture of one rib, left side, initial encounter for closed fracture: Secondary | ICD-10-CM | POA: Diagnosis present

## 2021-10-05 LAB — CBC
HCT: UNDETERMINED % (ref 36.0–46.0)
Hemoglobin: 8.8 g/dL — ABNORMAL LOW (ref 12.0–15.0)
MCH: UNDETERMINED pg (ref 26.0–34.0)
MCHC: UNDETERMINED g/dL (ref 30.0–36.0)
MCV: UNDETERMINED fL (ref 80.0–100.0)
Platelets: 569 10*3/uL — ABNORMAL HIGH (ref 150–400)
RBC: UNDETERMINED MIL/uL (ref 3.87–5.11)
RDW: UNDETERMINED % (ref 11.5–15.5)
WBC: 22.3 10*3/uL — ABNORMAL HIGH (ref 4.0–10.5)
nRBC: 0.3 % — ABNORMAL HIGH (ref 0.0–0.2)

## 2021-10-05 LAB — BASIC METABOLIC PANEL
Anion gap: 10 (ref 5–15)
BUN: 20 mg/dL (ref 8–23)
CO2: 27 mmol/L (ref 22–32)
Calcium: 7.7 mg/dL — ABNORMAL LOW (ref 8.9–10.3)
Chloride: 98 mmol/L (ref 98–111)
Creatinine, Ser: 0.71 mg/dL (ref 0.44–1.00)
GFR, Estimated: >60 mL/min (ref >60)
Glucose, Bld: 116 mg/dL — ABNORMAL HIGH (ref 70–99)
Potassium: 4.9 mmol/L (ref 3.5–5.1)
Sodium: 135 mmol/L (ref 135–145)

## 2021-10-05 MED ORDER — DOXYCYCLINE HYCLATE 100 MG PO TABS
100.0000 mg | ORAL_TABLET | Freq: Two times a day (BID) | ORAL | Status: DC
  Administered 2021-10-06 – 2021-10-08 (×5): 100 mg via ORAL
  Filled 2021-10-05 (×6): qty 1

## 2021-10-05 MED ORDER — QUETIAPINE FUMARATE 25 MG PO TABS
50.0000 mg | ORAL_TABLET | Freq: Every evening | ORAL | Status: DC
  Administered 2021-10-05: 50 mg via ORAL
  Filled 2021-10-05: qty 2

## 2021-10-05 NOTE — Progress Notes (Signed)
PHARMACIST - PHYSICIAN COMMUNICATION DR:   [place] CONCERNING: Antibiotic IV to Oral Route Change Policy  RECOMMENDATION: This patient is receiving Doxy by the intravenous route.  Based on criteria approved by the Pharmacy and Therapeutics Committee, the antibiotic(s) is/are being converted to the equivalent oral dose form(s).   DESCRIPTION: These criteria include: Patient being treated for a respiratory tract infection, urinary tract infection, cellulitis or clostridium difficile associated diarrhea if on metronidazole The patient is not neutropenic and does not exhibit a GI malabsorption state The patient is eating (either orally or via tube) and/or has been taking other orally administered medications for a least 24 hours The patient is improving clinically and has a Tmax < 100.5  If you have questions about this conversion, please contact the Pharmacy Department  []   [phone] )  [facility] []   [phone] )  [facility] [x]   [phone] )  [facility] []   [phone] )  [facility] []   [phone] )  [facility]

## 2021-10-05 NOTE — Progress Notes (Signed)
Progress Note   Patient: [name] WLN:[id number] DOB: 11/29/1940 DOA: 10/02/2021     3 DOS: the patient was seen and examined on 10/05/2021   Brief hospital course: Patient is a 81 year old female with history of chronic cold agglutinin hemolytic anemia, dementia presented with fall 3 days ago.  Patient was complaining of hip and chest pain 2 days later.  X-ray showed right hip fracture.  CT head also showed small intraventricular hemorrhage Dr. Hal Hope discussed with Dr. Venetia Constable, neurosurgery, at this time no neurosurgical interventions needed.  1/11: Received 2 units packed RBCs (warm) for hemoglobin of 6.9 1/12: Overnight agitated, will resume Seroquel.  H&H stable, underwent ORIF 1/13: H&H stable, dementia with confusion   Assessment and Plan * Closed right hip fracture ([place])- (present on admission) - Secondary to mechanical fall -- Status post ORIF, on 10/04/2021, postop day #1 -Continue pain control, DVT prophylaxis, PT evaluation after surgery  Left 9th rib fracture- (present on admission) - Denies any pain, no hypoxia.  Continue incentive spirometry if patient is able to comply.  Intraventricular hemorrhage (HCC) - Due to fall.  CT head showed small intraventricular hemorrhage.  Admitting physician, Dr. Hal Hope discussed with Dr. Venetia Constable (neurosurgery) who recommended no intervention needed at this time.  -Stable  Symptomatic anemia- (present on admission) - Hemoglobin 6.9, on 1/11 possibly due to the fracture, intraventricular hemorrhage, discussed with hematology, Dr Irene Limbo. S/p 2 units packed RBCs. 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  Has been following with Dr. Irene Limbo - s/p 2 u pRBC's on 1/11, H&H remains stable,  8.8  COPD (chronic obstructive pulmonary disease) ([place])- (present on admission) - No wheezing, currently stable -Chest x-ray showed stable cardiomegaly, left basilar atelectasis and/or small pleural effusion  Fall- (present on admission) -  will start PT evaluation, will need SNF  Dementia ([place])- (present on admission) - Pleasantly confused, has dementia, not sleeping well, sitter at the bedside - will increase Seroquel to 50 mg daily     Subjective: Pleasantly confused, sitter at the bedside.  Did not sleep at night.  Does not appear to be in any pain.  Objective BP (!) 112/43 (BP Location: Right Arm)    Pulse 93    Temp 97.7 F (36.5 C)    Resp 18    Ht 5\' 1"  (1.549 m)    Wt 42.6 kg    SpO2 100%    BMI 17.76 kg/m   Physical Exam General: Alert and oriented x self, pleasantly confused dementia Cardiovascular: S1 S2 clear, RRR. No pedal edema b/l Respiratory: CTAB, no wheezing Gastrointestinal: Soft, nontender, nondistended, NBS Ext: no pedal edema bilaterally Neuro: no new deficits Psych:, pleasantly confused   Data Reviewed:  CBC Latest Ref Rng & Units 10/05/2021 10/04/2021 10/03/2021  WBC 4.0 - 10.5 K/uL 22.3(H) 20.2(H) 18.4(H)  Hemoglobin 12.0 - 15.0 g/dL 8.8(L) 10.9(L) 6.9(LL)  Hematocrit 36.0 - 46.0 % Unable to determine due to a cold agglutinin 29.2(L) 20.5(L)  Platelets 150 - 400 K/uL 569(H) 438(H) 568(H)     Family Communication: Discussed with the patient's sister on 1/12  Disposition: Status is: Inpatient  Remains inpatient appropriate because: Will need skilled nursing facility.  DVT prophylaxis: Lovenox   Time spent: 25 minutes  Author: Estill Cotta, MD 10/05/2021 3:43 PM  For on call review  [email or web address]

## 2021-10-05 NOTE — Progress Notes (Addendum)
Orthopaedics Daily Progress Note   10/05/2021   8:07 AM  [name] is a 81 y.o. female 1 Day Post-Op s/p INTRAMEDULLARY (IM) NAIL INTERTROCHANTRIC  Subjective Resting comfortably. Pain apparently well controled.   Objective Vitals:   10/05/21 0400 10/05/21 0714  BP: (!) 135/48 (!) 134/42  Pulse: 97 92  Resp:  17  Temp: 98.1 F (36.7 C) 98 F (36.7 C)  SpO2: 100% 97%    Intake/Output Summary (Last 24 hours) at 10/05/2021 9470 Last data filed at 10/05/2021 0600 Gross per 24 hour  Intake 4585.42 ml  Output 975 ml  Net 3610.42 ml    Physical Exam RLE: iVAC intact, holding suction Unable to comply with motor exam due to mental status Unable to comply with sensory exam due to mental status WWP distally  Assessment 81 y.o. female s/p Procedure(s) (LRB): INTRAMEDULLARY (IM) NAIL INTERTROCHANTRIC (Right)  Plan Mobility: Out of bed with PT/OT Pain control: Continue to wean/titrate to appropriate oral regimen DVT Prophylaxis: Lovenox 30 mg twice daily x6 weeks Would defer to hematology/Dr. Irene Limbo and primary medical service if additional or alternative recommendations in the setting of cold agglutinin disease Further surgical plans: None RUE: No restrictions LUE: No restrictions RLE: Weightbearing as tolerated LLE: No restrictions Disposition: Per primary team when medically appropriate Dressing care: The PREVENA incisional VAC (iVAC) should be left on for 7 days and then removed.  After that the wounds may be dressed with dry sterile gauze dressing and Tegaderm (clear adhesive plastic squares).  If the PREVENA hose is connected to a large VAC machine in the hospital, this should be switched to the small portable PREVENA unit prior to discharge.  Please make sure a responsible party understands how to operate the [facility] unit before discharge, including how to charge the unit.  Please note that the [facility] dressing will ONLY work if connected to suction!  If the [facility]  unit fails and cannot be fixed/restored to normal operation to restore suction, the sponge and plastic dressing should be removed and replaced with gauze/Tegaderm.  If the dressing loses suction due to a leak attempt to seal the leak with Tegaderm or remove the sponge plastic dressing replace with gauze/Tegaderm. Follow-up: Please call [place] [phone]) to schedule follow-op appointment for 2 weeks after surgery. I have verified that my discharge instructions and follow-up information has been entered in the Discharge Navigator in Epic.  These should automatically populate in the AVS.  Please print the AVS in its entirety and ensure that the patient or a responsible party has a complete copy of the AVS before they are discharged.  If there are questions regarding discharge instructions or follow-up before the AVS is generated, please check the Discharge Navigator before attempting to contact the surgeon/office.  If unsure how to access the Discharge Navigator or the information contained in the Discharge Navigator, or how to generate/print the AVS, please contact the appropriate Nurse, learning disability.    [name] M.D. Orthopaedic Surgery [facility]

## 2021-10-05 NOTE — Assessment & Plan Note (Addendum)
-   Continue incentive spirometry if patient is able to comply.

## 2021-10-05 NOTE — NC FL2 (Signed)
Home LEVEL OF CARE SCREENING TOOL     IDENTIFICATION  Patient Name: [name] Birthdate: [birth year] Sex: female Admission Date (Current Location): 10/02/2021  [facility] and [place] Number:  Herbalist and Address:  The [place], [place] [address] [place], [place] [address]      Provider Number: [id number]  Attending Physician Name and Address:  Mendel Corning, MD  Relative Name and Phone Number:  Allena Katz [phone]  [phone]    Current Level of Care: Hospital Recommended Level of Care: [place] Prior Approval Number:    Date Approved/Denied:   PASRR Number: [id number]  Discharge Plan: SNF    Current Diagnoses: Patient Active Problem List   Diagnosis Date Noted   Left 9th rib fracture 10/05/2021   Dementia ([place]) 10/03/2021   Intraventricular hemorrhage ([place]) 10/03/2021   Hip fracture ([place]) 10/02/2021   Sepsis due to pneumonia ([place]) 08/10/2021   Sepsis due to undetermined organism ([place]) 08/09/2021   Aortic atherosclerosis ([place]) 08/09/2021   COPD (chronic obstructive pulmonary disease) ([place])    Elevated transaminase level    Symptomatic anemia 05/28/2018   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) 02/04/2018   Occult blood in stools 02/04/2018   Ureteral calculus 11/12/2015   Breast cancer screening, high risk patient 08/29/2015   Acute delirium 08/07/2015   Postoperative anemia due to acute blood loss 08/06/2015   Protein-calorie malnutrition, severe 08/04/2015   Closed intertrochanteric fracture of left femur ([place]) 08/03/2015   Fall 08/03/2015   Closed right hip fracture ([place]) 08/03/2015   Hyperbilirubinemia 08/01/2013   Hypokalemia 07/30/2013   Vitamin B 12 deficiency 08/07/2012   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[place]) 06/10/2012   Tobacco abuse 06/10/2012    Orientation RESPIRATION BLADDER Height & Weight      Self, Place  Normal Continent, External catheter Weight: 94 lb (42.6 kg) Height:  5\' 1"  (154.9 cm)  BEHAVIORAL SYMPTOMS/MOOD NEUROLOGICAL BOWEL NUTRITION STATUS      Continent Diet (see discharge summary)  AMBULATORY STATUS COMMUNICATION OF NEEDS Skin   Total Care Verbally Surgical wounds                       Personal Care Assistance Level of Assistance  Bathing, Feeding, Dressing Bathing Assistance: Maximum assistance Feeding assistance: Limited assistance Dressing Assistance: Maximum assistance     Functional Limitations Info  Sight, Hearing, Speech Sight Info: Adequate Hearing Info: Adequate Speech Info: Adequate    SPECIAL CARE FACTORS FREQUENCY  PT (By licensed PT), OT (By licensed OT)     PT Frequency: 5x week OT Frequency: 5x week            Contractures Contractures Info: Not present    Additional Factors Info  Code Status, Allergies Code Status Info: DNR Allergies Info: Other, Shellfish Allergy           Current Medications (10/05/2021):  This is the current hospital active medication list Current Facility-Administered Medications  Medication Dose Route Frequency Provider Last Rate Last Admin   acetaminophen (TYLENOL) tablet 325-650 mg  325-650 mg Oral Q6H PRN [name], MD       acetaminophen (TYLENOL) tablet 500 mg  500 mg Oral Q6H [name], MD   500 mg at 10/05/21 1218   docusate sodium (COLACE) capsule 100 mg  100 mg Oral BID [name], MD   100 mg at 10/05/21 [phone]  doxycycline (VIBRA-TABS) tablet 100 mg  100 mg Oral BID Pham, Minh Q, RPH-CPP       enoxaparin (LOVENOX) injection 30 mg  30 mg Subcutaneous Q12H [name], MD   30 mg at 10/05/21 0853   feeding supplement (ENSURE ENLIVE / ENSURE PLUS) liquid 237 mL  237 mL Oral BID BM [name], Ripudeep K, MD   237 mL at 00/86/76 1950   folic acid (FOLVITE) tablet 1 mg  1 mg Oral Daily [name], Ripudeep K, MD   1 mg at 10/05/21 0854   HYDROcodone-acetaminophen (NORCO) 7.5-325 MG  per tablet 1-2 tablet  1-2 tablet Oral Q4H PRN [name], MD   1 tablet at 10/05/21 0013   HYDROcodone-acetaminophen (NORCO/VICODIN) 5-325 MG per tablet 1-2 tablet  1-2 tablet Oral Q4H PRN [name], MD       menthol-cetylpyridinium (CEPACOL) lozenge 3 mg  1 lozenge Oral PRN [name], MD       Or   phenol (CHLORASEPTIC) mouth spray 1 spray  1 spray Mouth/Throat PRN [name], MD       morphine 2 MG/ML injection 0.5-1 mg  0.5-1 mg Intravenous Q2H PRN [name], MD       multivitamin with minerals tablet 1 tablet  1 tablet Oral Daily [name], Ripudeep K, MD   1 tablet at 10/05/21 0853   mupirocin ointment (BACTROBAN) 2 % 1 application  1 application Nasal BID Lisette Abu, PA-C   1 application at 93/26/71 0853   ondansetron (ZOFRAN) tablet 4 mg  4 mg Oral Q6H PRN [name], MD       Or   ondansetron [facility]) injection 4 mg  4 mg Intravenous Q6H PRN [name], MD       pantoprazole (PROTONIX) EC tablet 40 mg  40 mg Oral Q0600 [name], Ripudeep K, MD   40 mg at 10/05/21 2458   QUEtiapine (SEROQUEL) tablet 25 mg  25 mg Oral Q24H [name], Ripudeep K, MD   25 mg at 10/04/21 2157   tranexamic acid (CYKLOKAPRON) IVPB 1,000 mg  1,000 mg Intravenous Once [name], MD       vitamin B-12 (CYANOCOBALAMIN) tablet 2,000 mcg  2,000 mcg Oral Daily Rise Patience, MD   2,000 mcg at 10/05/21 0998     Discharge Medications: Please see discharge summary for a list of discharge medications.  Relevant Imaging Results:  Relevant Lab Results:   Additional Information SSN: [id number]  Joanne Chars, LCSW

## 2021-10-05 NOTE — Anesthesia Postprocedure Evaluation (Signed)
Anesthesia Post Note  Patient: [name]  Procedure(s) Performed: INTRAMEDULLARY (IM) NAIL INTERTROCHANTRIC (Right)     Patient location during evaluation: PACU Anesthesia Type: General Level of consciousness: awake and alert Pain management: pain level controlled Vital Signs Assessment: post-procedure vital signs reviewed and stable Respiratory status: spontaneous breathing, nonlabored ventilation, respiratory function stable and patient connected to nasal cannula oxygen Cardiovascular status: blood pressure returned to baseline and stable Postop Assessment: no apparent nausea or vomiting Anesthetic complications: no   No notable events documented.  Last Vitals:  Vitals:   10/05/21 0400 10/05/21 0714  BP: (!) 135/48 (!) 134/42  Pulse: 97 92  Resp:  17  Temp: 36.7 C 36.7 C  SpO2: 100% 97%    Last Pain:  Vitals:   10/05/21 0400  TempSrc: Tympanic  PainSc:                  [name]

## 2021-10-05 NOTE — Evaluation (Signed)
Physical Therapy Evaluation Patient Details Name: [name] MRN: [id number] DOB: 1941/06/17 Today's Date: 10/05/2021  History of Present Illness  Patient is a 81 year old female admitted 1/10 after a fall.    X-ray showed right hip fracture.  CT head also showed small intraventricular hemorrhage.  Pt underwent right IM nail 1/12.  PMH: chronic cold agglutinin hemolytic anemia, dementia .  Clinical Impression  Pt admitted with above diagnosis. Pt needed +2 max assist for moving bed to chair. Pt very confused and this hinders her ability to follow commands fully.  Pt very pleasant.  Will need sNF at d/c for therapy.  Pt currently with functional limitations due to the deficits listed below (see PT Problem List). Pt will benefit from skilled PT to increase their independence and safety with mobility to allow discharge to the venue listed below.          Recommendations for follow up therapy are one component of a multi-disciplinary discharge planning process, led by the attending physician.  Recommendations may be updated based on patient status, additional functional criteria and insurance authorization.  Follow Up Recommendations Skilled nursing-short term rehab (<3 hours/day)    Assistance Recommended at Discharge Frequent or constant Supervision/Assistance  Patient can return home with the following  Two people to help with walking and/or transfers;Two people to help with bathing/dressing/bathroom    Equipment Recommendations Other (comment) (TBA)  Recommendations for Other Services       Functional Status Assessment Patient has had a recent decline in their functional status and demonstrates the ability to make significant improvements in function in a reasonable and predictable amount of time.     Precautions / Restrictions Precautions Precautions: Fall Precaution Comments: VAC to right LE Restrictions Weight Bearing Restrictions: Yes RLE Weight Bearing: Weight bearing  as tolerated      Mobility  Bed Mobility Overal bed mobility: Needs Assistance Bed Mobility: Supine to Sit     Supine to sit: Mod assist;+2 for physical assistance;HOB elevated     General bed mobility comments: Assist for LEs to EOB as well as for elevatin of trunk.    Transfers Overall transfer level: Needs assistance Equipment used: 2 person hand held assist Transfers: Sit to/from Stand;Bed to chair/wheelchair/BSC Sit to Stand: Max assist;+2 physical assistance;From elevated surface   Step pivot transfers: Max assist;+2 physical assistance       General transfer comment: Pt needed max assist to stand and pivot to the recliner with pt bearing weight on bil LEs however incr weight on the left. did not stand fully upright.    Ambulation/Gait                  Stairs            Wheelchair Mobility    Modified Rankin (Stroke Patients Only)       Balance Overall balance assessment: Needs assistance Sitting-balance support: No upper extremity supported;Feet supported Sitting balance-Leahy Scale: Poor Sitting balance - Comments: leans left but was able to get her balance at times with min guard assist for only seconds and most of the time needing min to mod assist to sit eOB. Postural control: Posterior lean;Left lateral lean Standing balance support: Bilateral upper extremity supported;During functional activity Standing balance-Leahy Scale: Zero Standing balance comment: Max assist of 2 persons to come to partial stand.  Pertinent Vitals/Pain Pain Assessment: Faces Faces Pain Scale: Hurts even more Breathing: occasional labored breathing, short period of hyperventilation Negative Vocalization: occasional moan/groan, low speech, negative/disapproving quality Facial Expression: sad, frightened, frown Body Language: tense, distressed pacing, fidgeting Consolability: distracted or reassured by voice/touch PAINAD  Score: 5 Pain Location: right hip Pain Descriptors / Indicators: Grimacing;Guarding Pain Intervention(s): Limited activity within patient's tolerance;Monitored during session;Repositioned    Home Living Family/patient expects to be discharged to:: Unsure Living Arrangements: Other relatives                 Additional Comments: Per chart pt lived at home PTA.  She was in SNF in Dec but had been home since Dec 7.    Prior Function Prior Level of Function : Other (comment) (unsure)                     Hand Dominance   Dominant Hand: Right    Extremity/Trunk Assessment   Upper Extremity Assessment Upper Extremity Assessment: Defer to OT evaluation    Lower Extremity Assessment Lower Extremity Assessment: RLE deficits/detail RLE: Unable to fully assess due to pain    Cervical / Trunk Assessment Cervical / Trunk Assessment: Kyphotic  Communication   Communication: No difficulties  Cognition Arousal/Alertness: Awake/alert Behavior During Therapy: Flat affect;Impulsive Overall Cognitive Status: History of cognitive impairments - at baseline                                 General Comments: Pt wtih dementia, 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        General Comments General comments (skin integrity, edema, etc.): VAC in place right LE    Exercises General Exercises - Lower Extremity Ankle Circles/Pumps: AROM;Both;5 reps;Supine Long Arc Quad: AROM;Both;5 reps;Supine   Assessment/Plan    PT Assessment Patient needs continued PT services  PT Problem List Decreased activity tolerance;Decreased balance;Decreased mobility;Decreased strength;Decreased range of motion;Decreased knowledge of use of DME;Decreased safety awareness;Decreased knowledge of precautions;Decreased cognition;Pain       PT Treatment Interventions DME instruction;Gait training;Functional mobility  training;Stair training;Therapeutic activities;Therapeutic exercise;Balance training;Patient/family education;Wheelchair mobility training    PT Goals (Current goals can be found in the Care Plan section)  Acute Rehab PT Goals Patient Stated Goal: unable to state PT Goal Formulation: Patient unable to participate in goal setting Time For Goal Achievement: 10/19/21 Potential to Achieve Goals: Fair    Frequency Min 3X/week     Co-evaluation               AM-PAC PT "6 Clicks" Mobility  Outcome Measure Help needed turning from your back to your side while in a flat bed without using bedrails?: Total Help needed moving from lying on your back to sitting on the side of a flat bed without using bedrails?: Total Help needed moving to and from a bed to a chair (including a wheelchair)?: Total Help needed standing up from a chair using your arms (e.g., wheelchair or bedside chair)?: Total Help needed to walk in hospital room?: Total Help needed climbing 3-5 steps with a railing? : Total 6 Click Score: 6    End of Session Equipment Utilized During Treatment: Gait belt Activity Tolerance: Patient limited by fatigue Patient left: in chair;with call bell/phone within reach;with chair alarm set;with nursing/sitter in room Nurse Communication: Mobility status;Need for  lift equipment (Stedy vs Maximove) PT Visit Diagnosis: Muscle weakness (generalized) (M62.81);Pain;Unsteadiness on feet (R26.81) Pain - Right/Left: Right Pain - part of body: Hip    Time: 0479-9872 PT Time Calculation (min) (ACUTE ONLY): 18 min   Charges:   PT Evaluation $PT Eval Moderate Complexity: 1 Mod          Rohnan Bartleson M,PT Acute Rehab Services [phone] (pager)   Alvira Philips 10/05/2021, 1:26 PM

## 2021-10-05 NOTE — Care Management Important Message (Signed)
Important Message  Patient Details  Name: [name] MRN: [id number] Date of Birth: 01-11-1941   Medicare Important Message Given:  Yes     [name] 10/05/2021, 3:35 PM

## 2021-10-05 NOTE — Progress Notes (Signed)
Mobility Specialist Progress Note   10/05/21 1700  Pain Assessment  Pain Assessment Faces  Faces Pain Scale 6  Negative Vocalization 2  Facial Expression 1  Pain Location right hip  Pain Descriptors / Indicators Grimacing;Guarding  Pain Intervention(s) Limited activity within patient's tolerance  Mobility  Activity Transferred:  Chair to bed  Level of Assistance Maximum assist, patient does 25-49% (+2)  Assistive Device  (HHA)  RLE Weight Bearing WBAT  Mobility Response Tolerated poorly  Mobility performed by Mobility specialist;Nurse tech  $Mobility charge 1 Mobility   Received pt in chair having no complaints requiring max encouragement for transfer. Initial objective was to use the Stedy to tx pt but pt was too disoriented and unable to follow verbal cues for process. Second objective was then to SPT pt to EOB, pt resisting to support themselves w/ there LE's bc of pain and confusion and resisting [place] at assisting them at the same time. Eventually picked pt up and placed on EOB w/ +2 assist. Left w/ call bell by side and NT in room.   Holland Falling Mobility Specialist Phone Number [phone]

## 2021-10-05 NOTE — TOC Progression Note (Signed)
Transition of Care [facility]) - Progression Note    Patient Details  Name: [name] MRN: [id number] Date of Birth: [birth year]  [facility] CM/SW Contact  Joanne Chars, LCSW Phone Number: 10/05/2021, 2:40 PM  Clinical Narrative:   CSW spoke with pt and sister [name] in room, discussed PT recommendation for SNF.  Pt participated, did not give accurate information, corrected by sister.  They do want SNF, pt was at [facility] recently and that would be first choice.  Permission given to send out referral in hub, which was done.      Expected Discharge Plan: [place] Barriers to Discharge: Continued Medical Work up  Expected Discharge Plan and Services Expected Discharge Plan: [place]   Discharge Planning Services: CM Consult   Living arrangements for the past 2 months: Single Family Home                                       Social Determinants of Health (SDOH) Interventions    Readmission Risk Interventions Readmission Risk Prevention Plan 08/20/2021  Transportation Screening Complete  PCP or Specialist Appt within 5-7 Days Complete  Home Care Screening Complete  Medication Review (RN CM) Complete  Some recent data might be hidden

## 2021-10-06 DIAGNOSIS — S72001S Fracture of unspecified part of neck of right femur, sequela: Secondary | ICD-10-CM

## 2021-10-06 MED ORDER — QUETIAPINE FUMARATE 25 MG PO TABS
25.0000 mg | ORAL_TABLET | Freq: Every day | ORAL | Status: DC
  Administered 2021-10-06 – 2021-10-08 (×3): 25 mg via ORAL
  Filled 2021-10-06 (×3): qty 1

## 2021-10-06 NOTE — Progress Notes (Signed)
Progress Note   Patient: [name]:[id number] DOB: February 26, 1941 DOA: 10/02/2021     4 DOS: the patient was seen and examined on 10/06/2021   Brief hospital course: Patient is a 81 year old female with history of chronic cold agglutinin hemolytic anemia, dementia presented with fall 3 days ago.  Patient was complaining of hip and chest pain 2 days later.  X-ray showed right hip fracture.  CT head also showed small intraventricular hemorrhage Dr. Hal Hope discussed with Dr. Venetia Constable, neurosurgery, at this time no neurosurgical interventions needed.  1/11: Received 2 units packed RBCs (warm) for hemoglobin of 6.9 1/12: Overnight agitated, will resume Seroquel.  H&H stable, underwent ORIF 1/13: H&H stable, dementia with confusion, Seroquel increased to 50 mg nightly 1/14: Sound asleep, sitter at the bedside, stated that she slept around 7:30 AM   Assessment and Plan * Closed right hip fracture ([place])- (present on admission) - Secondary to mechanical fall -- Status post ORIF, on 10/04/2021 -Try to minimize narcotics or sedating meds so patient can participate in PT  Left 9th rib fracture- (present on admission) - Continue incentive spirometry if patient is able to comply.  Intraventricular hemorrhage (HCC) - Due to fall.  CT head showed small intraventricular hemorrhage.  Admitting physician, Dr. Hal Hope discussed with Dr. Venetia Constable (neurosurgery) who recommended no intervention needed at this time.  -Stable, no acute issues  Symptomatic anemia- (present on admission) - Hemoglobin 6.9, on 1/11 possibly due to the fracture, intraventricular hemorrhage, discussed with hematology, Dr Irene Limbo. S/p 2 units packed RBCs. 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  Has been following with  Dr. Irene Limbo - s/p 2 u pRBC's on 1/11, H&H remains stable, 8.8 on 1/13 -  Follow H&H today, labs still pending  COPD (chronic obstructive pulmonary disease) ([place])- (present on admission) - No wheezing, currently stable -Chest x-ray showed stable cardiomegaly, left basilar atelectasis and/or small pleural effusion  Fall- (present on admission) -Await PT evaluation, will likely need SNF for rehab  Dementia [facility])- (present on admission) - Patient has been confused and not sleeping since admission, Seroquel was increased to 50 mg at bedtime on 1/13.   -This a.m., sleeping soundly on exam, will reassess if Seroquel needs to be decreased back to 25 mg nightly    Subjective: Sleeping at the time of my examination this AM.  Per the sitter at the bedside she just had fallen asleep at 7:30 AM.  Overnight did sleep for 4 hours.  No other acute issues.  Objective Vitals:   10/06/21 0400 10/06/21 0724  BP: 102/61 (!) 101/54  Pulse: 80 83  Resp: 16 17  Temp: 99.6 F (37.6 C) 99 F (37.2 C)  SpO2: 100% 100%    Physical Exam General: Patient was sleepy at the time of my examination early in the morning, had just fallen asleep Cardiovascular: S1 S2 clear, RRR. No pedal edema b/l Respiratory: CTAB, Gastrointestinal: Soft, nondistended, NBS Ext: no pedal edema bilaterally   Data Reviewed:  There are no new results to review at this time.  Family Communication: No family at the bedside  Disposition: Status is: Inpatient  Remains inpatient appropriate because: Needs PT and skilled nursing facility  DVT prophylaxis Lovenox   Time spent: 35 minutes   Author: Estill Cotta, MD 10/06/2021 2:54 PM  For on call review [email or web address].

## 2021-10-06 NOTE — Progress Notes (Signed)
Subjective: 2 Days Post-Op Procedure(s) (LRB): INTRAMEDULLARY (IM) NAIL INTERTROCHANTRIC (Right) Patient reports pain as mild.  Patient has a Actuary at the bedside.  She is pleasantly confused.  The sitter reports that she did get out of bed to the chair yesterday with assistance.  Objective: Vital signs in last 24 hours: Temp:  [97.7 F (36.5 C)-99.6 F (37.6 C)] 99 F (37.2 C) (01/14 0724) Pulse Rate:  [80-114] 83 (01/14 0724) Resp:  [16-18] 17 (01/14 0724) BP: (101-125)/(43-61) 101/54 (01/14 0724) SpO2:  [99 %-100 %] 100 % (01/14 0724)  Intake/Output from previous day: 01/13 0701 - 01/14 0700 In: 440 [P.O.:440] Out: -  Intake/Output this shift: No intake/output data recorded.  Recent Labs    10/04/21 0343 10/05/21 0240  HGB 10.9* 8.8*   Recent Labs    10/04/21 0343 10/05/21 0240  WBC 20.2* 22.3*  RBC 3.01* Unable to determine due to a cold agglutinin  HCT 29.2* Unable to determine due to a cold agglutinin  PLT 438* 569*   Recent Labs    10/05/21 0240  NA 135  K 4.9  CL 98  CO2 27  BUN 20  CREATININE 0.71  GLUCOSE 116*  CALCIUM 7.7*   No results for input(s): LABPT, INR in the last 72 hours. Right hip/leg exam: Prevena/VAC dressing intact to right lateral hip.  Appears to be working well. Minimal swelling of right thigh.  Moves foot actively.  Able to actively plantar and dorsiflex her right foot.  Right calf is soft and nontender.    Assessment/Plan: 2 Days Post-Op Procedure(s) (LRB): INTRAMEDULLARY (IM) NAIL INTERTROCHANTRIC (Right) Plan: Continue Lovenox 30 mg twice daily for DVT prophylaxis. Out of bed to chair daily weightbearing as tolerated on right.  No need for any type of hip precautions. Will need transfer to SNF when medically stable.  We will leave this up to the medical team. We will follow the patient along.   Erlene Senters 10/06/2021, 9:02 AM

## 2021-10-07 LAB — BASIC METABOLIC PANEL
Anion gap: 5 (ref 5–15)
BUN: 15 mg/dL (ref 8–23)
CO2: 31 mmol/L (ref 22–32)
Calcium: 7.8 mg/dL — ABNORMAL LOW (ref 8.9–10.3)
Chloride: 99 mmol/L (ref 98–111)
Creatinine, Ser: 0.56 mg/dL (ref 0.44–1.00)
GFR, Estimated: >60 mL/min (ref >60)
Glucose, Bld: 110 mg/dL — ABNORMAL HIGH (ref 70–99)
Potassium: 4.3 mmol/L (ref 3.5–5.1)
Sodium: 135 mmol/L (ref 135–145)

## 2021-10-07 LAB — CBC
HCT: 18.8 % — ABNORMAL LOW (ref 36.0–46.0)
Hemoglobin: 6.3 g/dL — CL (ref 12.0–15.0)
MCH: 33.3 pg (ref 26.0–34.0)
MCHC: 33.5 g/dL (ref 30.0–36.0)
MCV: 99.5 fL (ref 80.0–100.0)
Platelets: 552 10*3/uL — ABNORMAL HIGH (ref 150–400)
RBC: 1.89 MIL/uL — ABNORMAL LOW (ref 3.87–5.11)
RDW: 17.4 % — ABNORMAL HIGH (ref 11.5–15.5)
WBC: 15.6 10*3/uL — ABNORMAL HIGH (ref 4.0–10.5)
nRBC: 0 % (ref 0.0–0.2)

## 2021-10-07 LAB — HEMOGLOBIN AND HEMATOCRIT, BLOOD
HCT: 20.5 % — ABNORMAL LOW (ref 36.0–46.0)
Hemoglobin: 7.3 g/dL — ABNORMAL LOW (ref 12.0–15.0)

## 2021-10-07 LAB — PREPARE RBC (CROSSMATCH)

## 2021-10-07 MED ORDER — MELATONIN 3 MG PO TABS
3.0000 mg | ORAL_TABLET | Freq: Every day | ORAL | Status: DC
  Administered 2021-10-07 – 2021-10-08 (×2): 3 mg via ORAL
  Filled 2021-10-07 (×2): qty 1

## 2021-10-07 MED ORDER — SODIUM CHLORIDE 0.9% IV SOLUTION: Freq: Once | INTRAVENOUS | Status: AC

## 2021-10-07 NOTE — Progress Notes (Signed)
Progress Note   Patient: [name] ZOX:[id number] DOB: 11-Apr-1941 DOA: 10/02/2021     5 DOS: the patient was seen and examined on 10/07/2021   Brief hospital course: Patient is a 81 year old female with history of chronic cold agglutinin hemolytic anemia, dementia presented with fall 3 days ago.  Patient was complaining of hip and chest pain 2 days later.  X-ray showed right hip fracture.  CT head also showed small intraventricular hemorrhage Dr. Hal Hope discussed with Dr. Venetia Constable, neurosurgery, at this time no neurosurgical interventions needed.  1/11: Received 2 units packed RBCs (warm) for hemoglobin of 6.9 1/12: Overnight agitated, will resume Seroquel.  H&H stable, underwent ORIF 1/13: H&H stable, dementia with confusion, Seroquel increased to 50 mg nightly 1/14: Sound asleep, sitter at the bedside, stated that she slept around 7:30 AM 1/15: Hemoglobin 6.3, transfuse 1 unit packed RBCs  Assessment and Plan * Closed right hip fracture ([place])- (present on admission) - Secondary to mechanical fall -- Status post ORIF, on 10/04/2021 -Try to minimize narcotics or sedating meds so patient can participate in PT  Left 9th rib fracture- (present on admission) - Continue incentive spirometry if patient is able to comply.  Intraventricular hemorrhage (HCC) - Due to fall.  CT head showed small intraventricular hemorrhage.  Admitting physician, Dr. Hal Hope discussed with Dr. Venetia Constable (neurosurgery) who recommended no intervention needed at this time.  -No acute issues, alert, pleasantly confused, no new FND's, appears close to her baseline  Symptomatic anemia- (present on admission) - Hemoglobin 6.9, on 1/11 possibly due to the fracture, intraventricular hemorrhage, discussed with hematology, Dr Irene Limbo. S/p 2 units packed RBCs. --Continue folic acid 1 daily - Postop anemia, hemoglobin 6.3 today, transfuse 1 unit  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[place])- (present on  admission) - Patient has a history of idiopathic chronic cold agglutinin disease and hemolytic anemia.  Has been following with Dr. Irene Limbo - s/p 2 u pRBC's on 1/11, hemoglobin down to 6.3 today, will transfuse 1 unit packed RBCs (warm)  COPD (chronic obstructive pulmonary disease) ([place])- (present on admission) - No wheezing, currently stable -Chest x-ray showed stable cardiomegaly, left basilar atelectasis and/or small pleural effusion  Fall- (present on admission) - PT evaluation done, recommended SNF  Dementia ([place])- (present on admission) - Patient was much more drowsy after Seroquel was increased to 50 mg however had not slept for 2 days before.  -Seroquel decreased back to 25 mg daily, will add melatonin for sleeping.     Subjective: Alert and awake, pleasantly confused, denies any pain.  Objective Vitals:   10/07/21 1206 10/07/21 1245  BP: (!) 140/53   Pulse: 89 77  Resp:    Temp: 99 F (37.2 C) 98.9 F (37.2 C)  SpO2: 99% 99%    Physical Exam General: Alert and oriented x self, NAD, pleasant Cardiovascular: S1 S2 clear, RRR. No pedal edema b/l Respiratory: CTAB, no wheezing, rales or rhonchi Gastrointestinal: Soft, nontender, nondistended, NBS Ext: no pedal edema bilaterally Neuro: no new deficits Psych: dementia, pleasantly confused   Data Reviewed: CBC Latest Ref Rng & Units 10/07/2021 10/05/2021 10/04/2021  WBC 4.0 - 10.5 K/uL 15.6(H) 22.3(H) 20.2(H)  Hemoglobin 12.0 - 15.0 g/dL 6.3(LL) 8.8(L) 10.9(L)  Hematocrit 36.0 - 46.0 % 18.8(L) Unable to determine due to a cold agglutinin 29.2(L)  Platelets 150 - 400 K/uL 552(H) 569(H) 438(H)     BMP Latest Ref Rng & Units 10/07/2021 10/05/2021 10/03/2021  Glucose 70 - 99 mg/dL 110(H) 116(H) 117(H)  BUN 8 -  23 mg/dL 15 20 14   Creatinine 0.44 - 1.00 mg/dL 0.56 0.71 0.59  Sodium 135 - 145 mmol/L 135 135 134(L)  Potassium 3.5 - 5.1 mmol/L 4.3 4.9 4.8  Chloride 98 - 111 mmol/L 99 98 95(L)  CO2 22 - 32 mmol/L 31 27 28    Calcium 8.9 - 10.3 mg/dL 7.8(L) 7.7(L) 7.7(L)    Family Communication:   Disposition: Status is: Inpatient  Remains inpatient appropriate because: Needs skilled nursing facility, transition to     Time spent: 36 minutes  Author: Estill Cotta, MD 10/07/2021 12:58 PM  For on call review [email or web address].

## 2021-10-07 NOTE — Progress Notes (Addendum)
Subjective: 3 Days Post-Op Procedure(s) (LRB): INTRAMEDULLARY (IM) NAIL INTERTROCHANTRIC (Right) Patient reports pain as mild.  The patient's sister is at the bedside.  The patient is at her usual mental status today.  She is without complaints.  She tells me that she has not been out of bed.  Objective: Vital signs in last 24 hours: Temp:  [97.9 F (36.6 C)-99 F (37.2 C)] 97.9 F (36.6 C) (01/15 1456) Pulse Rate:  [72-99] 72 (01/15 1456) Resp:  [14-17] 17 (01/15 1456) BP: (106-140)/(32-53) 122/44 (01/15 1456) SpO2:  [97 %-100 %] 99 % (01/15 1456)  Intake/Output from previous day: 01/14 0701 - 01/15 0700 In: 480 [P.O.:480] Out: 1000 [Urine:1000] Intake/Output this shift: No intake/output data recorded.  Recent Labs    10/05/21 0240 10/07/21 0233  HGB 8.8* 6.3*   Recent Labs    10/05/21 0240 10/07/21 0233  WBC 22.3* 15.6*  RBC Unable to determine due to a cold agglutinin 1.89*  HCT Unable to determine due to a cold agglutinin 18.8*  PLT 569* 552*   Recent Labs    10/05/21 0240 10/07/21 0233  NA 135 135  K 4.9 4.3  CL 98 99  CO2 27 31  BUN 20 15  CREATININE 0.71 0.56  GLUCOSE 116* 110*  CALCIUM 7.7* 7.8*   No results for input(s): LABPT, INR in the last 72 hours. Right hip/thigh exam: Prevena dressing intact and functioning well. No significant thigh swelling.  Mild hip tenderness.  She has good ankle plantar and dorsiflexion.  Right calf is soft and nontender    Assessment/Plan: 3 Days Post-Op Procedure(s) (LRB): INTRAMEDULLARY (IM) NAIL INTERTROCHANTRIC (Right) Plan: Needs daily physical therapy to get her out of bed to a chair. With her history of significant anemia and the need for packed RBC transfusion earlier today along with the intraventricular hemorrhage shown on the CT of her head on admission we wonder if it would be prudent/reasonable to either decrease her Lovenox dose to 30 mg daily or even consider using aspirin 81 mg or 325 mg  enteric-coated twice daily for DVT prophylaxis along with the PAS compression hose.  Certainly we will leave this up to the medical team. Dr. Berenice Primas will try to contact Dr.[name] about this She may weight-bear as tolerated on the right lower extremity. Will need skilled nursing facility and her sister is interested in pursuing this as soon as it can be arranged. I discussed all of this with the patient's sister. OT consult ordered.   Erlene Senters 10/07/2021, 4:36 PM

## 2021-10-07 NOTE — TOC Progression Note (Addendum)
Transition of Care [facility]) - Progression Note    Patient Details  Name: LADAVIA LINDENBAUM MRN: [id number] Date of Birth: [birth year]  [facility] CM/SW Contact  Joanne Chars, LCSW Phone Number: 10/07/2021, 2:50 PM  Clinical Narrative:   CSW met with pt and sister [name], presented bed offers.  [name] still requesting response from [facility], also interested in [facility].  [name] not excited about any current bed offers with the exception of possibly [facility].  CSW reached out to Elyse Hsu at [facility], [place] noted that [facility] did actually respond and does not have bed availability.   CSW reached out to [place] at [facility], no bed availability.  Auth initiated in [place] with facility choice pending.      Expected Discharge Plan: [place] Barriers to Discharge: Continued Medical Work up  Expected Discharge Plan and Services Expected Discharge Plan: [place]   Discharge Planning Services: CM Consult   Living arrangements for the past 2 months: Single Family Home                                       Social Determinants of Health (SDOH) Interventions    Readmission Risk Interventions Readmission Risk Prevention Plan 08/20/2021  Transportation Screening Complete  PCP or Specialist Appt within 5-7 Days Complete  Home Care Screening Complete  Medication Review (RN CM) Complete  Some recent data might be hidden

## 2021-10-08 ENCOUNTER — Encounter (HOSPITAL_COMMUNITY): Payer: Self-pay | Admitting: Internal Medicine

## 2021-10-08 DIAGNOSIS — D5912 Cold autoimmune hemolytic anemia: Secondary | ICD-10-CM | POA: Diagnosis not present

## 2021-10-08 LAB — CBC
HCT: 21.3 % — ABNORMAL LOW (ref 36.0–46.0)
Hemoglobin: 7 g/dL — ABNORMAL LOW (ref 12.0–15.0)
MCH: 33.3 pg (ref 26.0–34.0)
MCHC: 32.9 g/dL (ref 30.0–36.0)
MCV: 101.4 fL — ABNORMAL HIGH (ref 80.0–100.0)
Platelets: 562 10*3/uL — ABNORMAL HIGH (ref 150–400)
RBC: 2.1 MIL/uL — ABNORMAL LOW (ref 3.87–5.11)
RDW: 20.5 % — ABNORMAL HIGH (ref 11.5–15.5)
WBC: 17.8 10*3/uL — ABNORMAL HIGH (ref 4.0–10.5)
nRBC: 0.2 % (ref 0.0–0.2)

## 2021-10-08 MED ORDER — FOLIC ACID 1 MG PO TABS
2.0000 mg | ORAL_TABLET | Freq: Every day | ORAL | Status: DC
  Administered 2021-10-09: 2 mg via ORAL
  Filled 2021-10-08: qty 2

## 2021-10-08 MED ORDER — ENOXAPARIN SODIUM 30 MG/0.3ML IJ SOSY
30.0000 mg | PREFILLED_SYRINGE | INTRAMUSCULAR | Status: DC
  Administered 2021-10-08: 30 mg via SUBCUTANEOUS
  Filled 2021-10-08: qty 0.3

## 2021-10-08 MED ORDER — TRAMADOL HCL 50 MG PO TABS: 50.0000 mg | ORAL_TABLET | Freq: Four times a day (QID) | ORAL | Status: DC | PRN

## 2021-10-08 MED ORDER — HYDROCODONE-ACETAMINOPHEN 7.5-325 MG PO TABS: 1.0000 | ORAL_TABLET | Freq: Four times a day (QID) | ORAL | Status: DC | PRN

## 2021-10-08 NOTE — Progress Notes (Signed)
Orthopaedics Daily Progress Note   10/08/2021   11:24 AM  [name] is a 81 y.o. female 4 Days Post-Op s/p INTRAMEDULLARY (IM) NAIL INTERTROCHANTRIC  Subjective Resting comfortably. Pain apparently well controled.    Objective Vitals:   10/08/21 0850 10/08/21 1121  BP: (!) 147/50 (!) 106/41  Pulse: 84 60  Resp: 17 16  Temp: 98 F (36.7 C) 98 F (36.7 C)  SpO2: 96% 99%    Intake/Output Summary (Last 24 hours) at 10/08/2021 1124 Last data filed at 10/08/2021 0700 Gross per 24 hour  Intake 395 ml  Output 0 ml  Net 395 ml     Physical Exam Resting comfortably RLE: iVAC intact, holding suction WWP distally  Assessment 81 y.o. female s/p Procedure(s) (LRB): INTRAMEDULLARY (IM) NAIL INTERTROCHANTRIC (Right)  Plan Mobility: Out of bed with PT/OT Pain control: Continue to wean/titrate to appropriate oral regimen DVT Prophylaxis: Lovenox 30 mg twice daily x6 weeks Would defer to hematology/Dr. Irene Limbo and primary medical service for additional or alternative recommendations in the setting of cold agglutinin disease Further surgical plans: None RUE: No restrictions LUE: No restrictions RLE: Weightbearing as tolerated LLE: No restrictions Disposition: Per primary team when medically appropriate Dressing care: The PREVENA incisional VAC (iVAC) should be left on for 7 days and then removed (I.e. on POD7, on 10/11/2021).  After that the wounds may be dressed with dry sterile gauze dressing and Tegaderm (clear adhesive plastic squares).  If the PREVENA hose is connected to a large VAC machine in the hospital, this should be switched to the small portable PREVENA unit prior to discharge.  Please make sure a responsible party understands how to operate the [facility] unit before discharge, including how to charge the unit.  Please note that the [facility] [place] dressing will ONLY work if connected to suction!  If the [facility] unit fails and cannot be fixed/restored to normal operation to  restore suction, the sponge and plastic dressing should be removed and replaced with gauze/Tegaderm.  If the dressing loses suction due to a leak attempt to seal the leak with Tegaderm or remove the sponge plastic dressing replace with gauze/Tegaderm. Follow-up: Please call [place] [phone]) to schedule follow-op appointment for 2 weeks after surgery. I have verified that my discharge instructions and follow-up information has been entered in the Discharge Navigator in Epic.  These should automatically populate in the AVS.  Please print the AVS in its entirety and ensure that the patient or a responsible party has a complete copy of the AVS before they are discharged.  If there are questions regarding discharge instructions or follow-up before the AVS is generated, please check the Discharge Navigator before attempting to contact the surgeon/office.  If unsure how to access the Discharge Navigator or the information contained in the Discharge Navigator, or how to generate/print the AVS, please contact the appropriate Nurse, learning disability.    [name] M.D. Orthopaedic Surgery [facility]

## 2021-10-08 NOTE — Progress Notes (Signed)
Physical Therapy Treatment Patient Details Name: [name] MRN: [id number] DOB: 1940/09/27 Today's Date: 10/08/2021   History of Present Illness Patient is a 81 year old female admitted 1/10 after a fall.    X-ray showed right hip fracture.  CT head also showed small intraventricular hemorrhage.  Pt underwent right IM nail 1/12.  PMH: chronic cold agglutinin hemolytic anemia, dementia .    PT Comments    Pt admitted with above diagnosis. Pt needing +2 max assist to squat pivot to chair as she cannot functionally weight bear for standing currently.  Pt does appear to have pain on right with attempts to stand.  Exercises and transfer only.  Pt currently with functional limitations due to balance and endurance deficits. Pt will benefit from skilled PT to increase their independence and safety with mobility to allow discharge to the venue listed below.      Recommendations for follow up therapy are one component of a multi-disciplinary discharge planning process, led by the attending physician.  Recommendations may be updated based on patient status, additional functional criteria and insurance authorization.  Follow Up Recommendations  Skilled nursing-short term rehab (<3 hours/day)     Assistance Recommended at Discharge Frequent or constant Supervision/Assistance  Patient can return home with the following Two people to help with walking and/or transfers;Two people to help with bathing/dressing/bathroom   Equipment Recommendations  Other (comment) (TBA)    Recommendations for Other Services       Precautions / Restrictions Precautions Precautions: Fall Precaution Comments: VAC to right LE Restrictions Weight Bearing Restrictions: No RLE Weight Bearing: Weight bearing as tolerated     Mobility  Bed Mobility Overal bed mobility: Needs Assistance Bed Mobility: Supine to Sit     Supine to sit: +2 for physical assistance;Total assist     General bed mobility  comments: assist for all aspects    Transfers Overall transfer level: Needs assistance Equipment used: 2 person hand held assist Transfers: Bed to chair/wheelchair/BSC Sit to Stand: Max assist;+2 physical assistance;From elevated surface   Squat pivot transfers: +2 physical assistance;Total assist       General transfer comment: attempted use of stedy, pt unable to tolerate weight on R LE, squat pivot to chair with use of bed pad under hips    Ambulation/Gait                   Stairs             Wheelchair Mobility    Modified Rankin (Stroke Patients Only)       Balance Overall balance assessment: Needs assistance Sitting-balance support: No upper extremity supported;Feet supported Sitting balance-Leahy Scale: Zero Sitting balance - Comments: mod assist Postural control: Left lateral lean   Standing balance-Leahy Scale: Zero                              Cognition Arousal/Alertness: Awake/alert Behavior During Therapy: Flat affect Overall Cognitive Status: History of cognitive impairments - at baseline                                 General Comments: off topic comments throughout session, word finding deficits        Exercises General Exercises - Lower Extremity Ankle Circles/Pumps: AROM;Both;5 reps;Supine Long Arc Quad: AROM;Both;5 reps;Supine    General Comments General comments (skin integrity, edema, etc.): Desat to 84% on  RA and replaced 2LO2 with sats 88% and >      Pertinent Vitals/Pain Pain Assessment: Faces Faces Pain Scale: Hurts even more Pain Location: right hip Pain Descriptors / Indicators: Grimacing;Guarding Pain Intervention(s): Limited activity within patient's tolerance;Monitored during session;Repositioned    Home Living Family/patient expects to be discharged to:: Skilled nursing facility Living Arrangements: Parent                 Additional Comments: was living with her mother, per  last admission, pt was walking with RW and min assist    Prior Function            PT Goals (current goals can now be found in the care plan section) Acute Rehab PT Goals Patient Stated Goal: unable to state Progress towards PT goals: Progressing toward goals    Frequency    Min 3X/week      PT Plan Current plan remains appropriate    Co-evaluation PT/OT/SLP Co-Evaluation/Treatment: Yes Reason for Co-Treatment: Complexity of the patient's impairments (multi-system involvement);For patient/therapist safety PT goals addressed during session: Mobility/safety with mobility        AM-PAC PT "6 Clicks" Mobility   Outcome Measure  Help needed turning from your back to your side while in a flat bed without using bedrails?: Total Help needed moving from lying on your back to sitting on the side of a flat bed without using bedrails?: Total Help needed moving to and from a bed to a chair (including a wheelchair)?: Total Help needed standing up from a chair using your arms (e.g., wheelchair or bedside chair)?: Total Help needed to walk in hospital room?: Total Help needed climbing 3-5 steps with a railing? : Total 6 Click Score: 6    End of Session Equipment Utilized During Treatment: Gait belt;Oxygen Activity Tolerance: Patient limited by fatigue Patient left: in chair;with call bell/phone within reach;with chair alarm set Nurse Communication: Mobility status;Need for lift equipment (Maximove) PT Visit Diagnosis: Muscle weakness (generalized) (M62.81);Pain;Unsteadiness on feet (R26.81) Pain - Right/Left: Right Pain - part of body: Hip     Time: 1212-1241 PT Time Calculation (min) (ACUTE ONLY): 29 min  Charges:  $Therapeutic Activity: 8-22 mins                     [name] M,PT Acute Rehab Services [phone] (pager)    [name] 10/08/2021, 2:28 PM

## 2021-10-08 NOTE — Consult Note (Addendum)
[place]  [phone] [phone]    HEMATOLOGY CONSULTATION  Referring MD: Dr. 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  Currently admitted following a fall.  Found to have a right hip fracture and fracture of the left sided ninth ribs.  Additionally, imaging obtained on admission showed a small volume acute intraventricular hemorrhage measuring up to 12 mm.  Results were discussed with neurosurgery who did not advise any intervention at that time.  Underwent right ORIF on 10/04/2021.  Has been anemic this admission.  She has received 3 units PRBC so far.  For the patient's cold agglutinin autoimmune hemolytic anemia, she had been receiving weekly rituximab.  Received her third dose on 09/27/2021.  Missed a dose on 10/03/2021 secondary to hospitalization.  I met with [name] in her hospital room.  No family at the bedside.  She is sitting up in the recliner.  She states that she has some right hip pain.  She has not noticed any bleeding.  She offers no other complaints today and specifically denies headaches, dizziness, chest pain, shortness of breath, abdominal pain, nausea, vomiting, bleeding.  Hematology was asked to see the patient for recommendations regarding her cold agglutinin autoimmune hemolytic anemia.  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[place])    montiored by dr Marko Plume   Intraventricular hemorrhage  ([place]) 10/03/2021   Renal and ureteric calculus    left  :     Past Surgical History:  Procedure Laterality Date   [place] EXCISIONAL BIOPSY Right 1984   CATARACT EXTRACTION W/ INTRAOCULAR LENS IMPLANT Right 1990's   congential cataract   CYSTOSCOPY W/ URETERAL STENT PLACEMENT  11/12/2015   Procedure: CYSTOSCOPY WITH RETROGRADE PYELOGRAM/URETERAL STENT PLACEMENT;  Surgeon: [name], MD;  Location: [facility];  Service: Urology;;   CYSTOSCOPY W/ URETERAL STENT REMOVAL Left 11/22/2015   Procedure: CYSTOSCOPY WITH STENT REMOVAL;  Surgeon: [name], MD;  Location[facility];  Service: Urology;  Laterality: Left;   CYSTOSCOPY/URETEROSCOPY/HOLMIUM LASER Left 11/22/2015   Procedure: CYSTOSCOPY/URETEROSCOPY/HOLMIUM LASER;  Surgeon: [name], MD;  Location: [facility];  Service: Urology;  Laterality: Left;  FLEX URETEROSCOPY   FEMUR IM NAIL Left 08/04/2015   Procedure: INTRAMEDULLARY (IM) NAIL FEMORAL;  Surgeon: [name], MD;  Location: [place];  Service: Orthopedics;  Laterality: Left;   INTRAMEDULLARY (IM) NAIL INTERTROCHANTERIC Right 10/04/2021   Procedure: INTRAMEDULLARY (IM) NAIL INTERTROCHANTRIC;  Surgeon: [name], MD;  Location: [place];  Service: Orthopedics;  Laterality: Right;   IR KYPHO THORACIC WITH BONE BIOPSY  06/22/2018   TONSILLECTOMY  age 22  :   CURRENT MEDS: Current Facility-Administered Medications  Medication Dose Route Frequency Provider Last Rate Last Admin   acetaminophen (TYLENOL) tablet 325-650 mg  325-650 mg Oral Q6H PRN [name], MD  docusate sodium (COLACE) capsule 100 mg  100 mg Oral BID [name], MD   100 mg at 10/08/21 0865   doxycycline (VIBRA-TABS) tablet 100 mg  100 mg Oral BID Pham, Minh Q, RPH-CPP   100 mg at 10/08/21 0915   enoxaparin (LOVENOX) injection 30 mg  30 mg Subcutaneous Q24H [name], Ripudeep K, MD       feeding supplement (ENSURE ENLIVE  / ENSURE PLUS) liquid 237 mL  237 mL Oral BID BM [name], Ripudeep K, MD   237 mL at 78/46/96 2952   folic acid (FOLVITE) tablet 1 mg  1 mg Oral Daily [name], Ripudeep K, MD   1 mg at 10/08/21 0916   HYDROcodone-acetaminophen (NORCO) 7.5-325 MG per tablet 1-2 tablet  1-2 tablet Oral Q6H PRN [name], MD       melatonin tablet 3 mg  3 mg Oral QHS [name], Ripudeep K, MD   3 mg at 10/07/21 2042   menthol-cetylpyridinium (CEPACOL) lozenge 3 mg  1 lozenge Oral PRN [name], MD       Or   phenol (CHLORASEPTIC) mouth spray 1 spray  1 spray Mouth/Throat PRN [name], MD       morphine 2 MG/ML injection 0.5-1 mg  0.5-1 mg Intravenous Q2H PRN [name], MD   1 mg at 10/07/21 1951   multivitamin with minerals tablet 1 tablet  1 tablet Oral Daily [name], Ripudeep K, MD   1 tablet at 10/08/21 0916   mupirocin ointment (BACTROBAN) 2 % 1 application  1 application Nasal BID Lisette Abu, PA-C   1 application at 84/13/24 0916   ondansetron (ZOFRAN) tablet 4 mg  4 mg Oral Q6H PRN [name], MD       Or   ondansetron [facility]) injection 4 mg  4 mg Intravenous Q6H PRN [name], MD       pantoprazole (PROTONIX) EC tablet 40 mg  40 mg Oral Q0600 [name], Ripudeep K, MD   40 mg at 10/08/21 0527   QUEtiapine (SEROQUEL) tablet 25 mg  25 mg Oral QHS [name], Ripudeep K, MD   25 mg at 10/07/21 2041   traMADol (ULTRAM) tablet 50 mg  50 mg Oral Q6H PRN [name], MD       tranexamic acid (CYKLOKAPRON) IVPB 1,000 mg  1,000 mg Intravenous Once [name], MD       vitamin B-12 (CYANOCOBALAMIN) tablet 2,000 mcg  2,000 mcg Oral Daily Rise Patience, MD   2,000 mcg at 10/08/21 4010       Allergies  Allergen Reactions   Other Other (See Comments)    "Most antibiotics give yeast infection"   Shellfish Allergy Hives  :   Family History  Problem Relation Age of Onset   Breast cancer Cousin   :   Social History   Socioeconomic History   Marital status: Divorced    Spouse name: Not  on file   Number of children: Not on file   Years of education: Not on file   Highest education level: Not on file  Occupational History   Not on file  Tobacco Use   Smoking status: Former    Packs/day: 1.00    Types: Cigarettes    Quit date: 08/03/2015    Years since quitting: 6.1   Smokeless tobacco: Never  Vaping Use   Vaping Use: Never used  Substance and Sexual Activity   Alcohol use: Yes    Comment: occ   Drug use: Never  Sexual activity: Not Currently  Other Topics Concern   Not on file  Social History Narrative   Not on file   Social Determinants of Health   Financial Resource Strain: Not on file  Food Insecurity: Not on file  Transportation Needs: Not on file  Physical Activity: Not on file  Stress: Not on file  Social Connections: Not on file  Intimate Partner Violence: Not on file  :  REVIEW OF SYSTEMS:  A comprehensive 14 point review of systems was negative except as noted in the HPI.    Exam: Patient Vitals for the past 24 hrs:  BP Temp Temp src Pulse Resp SpO2  10/08/21 1121 (!) 106/41 98 F (36.7 C) Oral 60 16 99 %  10/08/21 0850 (!) 147/50 98 F (36.7 C) Oral 84 17 96 %  10/08/21 0532 (!) 132/42 98.3 F (36.8 C) Oral 86 17 96 %  10/08/21 0500 -- -- -- -- 17 --  10/07/21 1952 (!) 129/52 98.4 F (36.9 C) Oral -- 17 100 %  10/07/21 1948 (!) 109/46 -- -- 67 16 --  10/07/21 1948 (!) 109/46 98.3 F (36.8 C) Oral 66 20 100 %  10/07/21 1456 (!) 122/44 97.9 F (36.6 C) Oral 72 17 99 %    Physical Exam Constitutional:      General: She is not in acute distress. HENT:     Head: Normocephalic.     Mouth/Throat:     Pharynx: No oropharyngeal exudate.  Eyes:     General: No scleral icterus. Cardiovascular:     Rate and Rhythm: Normal rate and regular rhythm.  Pulmonary:     Effort: Pulmonary effort is normal. No respiratory distress.     Breath sounds: Normal breath sounds.  Abdominal:     General: Bowel sounds are normal.  Skin:     General: Skin is warm and dry.  Neurological:     Mental Status: She is alert.     Comments: Mild confusion   LABS:  Lab Results  Component Value Date   WBC 17.8 (H) 10/08/2021   HGB 7.0 (L) 10/08/2021   HCT 21.3 (L) 10/08/2021   PLT 562 (H) 10/08/2021   GLUCOSE 110 (H) 10/07/2021   ALT 21 10/02/2021   AST 29 10/02/2021   NA 135 10/07/2021   K 4.3 10/07/2021   CL 99 10/07/2021   CREATININE 0.56 10/07/2021   BUN 15 10/07/2021   CO2 31 10/07/2021   INR 1.3 (H) 08/09/2021    DG Ribs Unilateral W/Chest Left  Result Date: 10/02/2021 CLINICAL DATA:  Fall, chest pain. EXAM: LEFT RIBS AND CHEST - 3+ VIEW COMPARISON:  Chest x-ray 08/10/2021. FINDINGS: Cardiomediastinal silhouette is stable, the heart is mildly enlarged. There are minimal patchy opacities in the left costophrenic angle. There may be a small left pleural effusion. There is no evidence for pneumothorax. There are atherosclerotic calcifications of the aorta. There is an acute left lateral ninth rib fracture. IMPRESSION: 1. Acute fracture left ninth rib. 2. Minimal left basilar atelectasis/airspace disease with questionable small left pleural effusion. Electronically Signed   By: Ronney Asters M.D.   On: 10/02/2021 20:26   CT HEAD WO CONTRAST (5MM)  Result Date: 10/03/2021 CLINICAL DATA:  Initial evaluation for acute head trauma. EXAM: CT HEAD WITHOUT CONTRAST TECHNIQUE: Contiguous axial images were obtained from the base of the skull through the vertex without intravenous contrast. RADIATION DOSE REDUCTION: This exam was performed according to the departmental dose-optimization program which includes automated  exposure control, adjustment of the mA and/or kV according to patient size and/or use of iterative reconstruction technique. COMPARISON:  None available. FINDINGS: Brain: Moderately advanced age-related cerebral atrophy with chronic small vessel ischemic disease. Small volume acute intraventricular hemorrhage centered about  the foramen of Monro measures up to 12 mm (series 4, image 15). Additional possible trace hemorrhage within the left temporal horn (series 4, image 11). Mild ventricular prominence related to global parenchymal volume loss without hydrocephalus or trapping. No other acute intracranial hemorrhage. No acute large vessel territory infarct. No mass lesion or midline shift. No extra-axial fluid collection. Vascular: No hyperdense vessel. Scattered vascular calcifications noted within the carotid siphons. Skull: Scalp soft tissues within normal limits.  Calvarium intact. Sinuses/Orbits: Globes and orbital soft tissues demonstrate no acute finding. Paranasal sinuses are clear. No significant mastoid effusion. Other: None. IMPRESSION: 1. Small volume acute intraventricular hemorrhage measuring up to 12 mm, primarily centered about the foramen of Monro. No hydrocephalus or trapping. 2. Moderately advanced age-related cerebral atrophy with chronic small vessel ischemic disease. Critical Value/emergent results were called by telephone at the time of interpretation on 10/03/2021 at 1:40 am to provider [facility] , who verbally acknowledged these results. Electronically Signed   By: Jeannine Boga M.D.   On: 10/03/2021 01:42   DG CHEST PORT 1 VIEW  Result Date: 10/03/2021 CLINICAL DATA:  Shortness of breath, hip fracture, COPD EXAM: PORTABLE CHEST 1 VIEW COMPARISON:  Chest radiograph dated October 02, 2021 FINDINGS: The heart is enlarged. Atherosclerotic calcification of aortic arch. Elevation of the left hemidiaphragm with left basilar atelectasis and/or small left pleural effusion. The right lung is clear. Vertebroplasty of the lower thoracic vertebral body. Previously noted left ninth rib fracture is again noted. IMPRESSION: 1. Stable cardiomegaly. 2. Elevation of the left hemidiaphragm with left basilar atelectasis and/or small pleural effusion. Electronically Signed   By: Keane Police D.O.   On: 10/03/2021  08:37   DG Knee Complete 4 Views Right  Result Date: 10/02/2021 CLINICAL DATA:  Fall. EXAM: RIGHT KNEE - COMPLETE 4+ VIEW COMPARISON:  None. FINDINGS: There is no acute fracture or dislocation of the right knee. The bones are osteopenic. There is moderate arthritic changes. No significant joint effusion. There is diffuse subcutaneous edema. IMPRESSION: No acute fracture or dislocation of the right knee. Electronically Signed   By: Anner Crete M.D.   On: 10/02/2021 20:27   DG C-Arm 1-60 Min-No Report  Result Date: 10/04/2021 Fluoroscopy was utilized by the requesting physician.  No radiographic interpretation.   DG C-Arm 1-60 Min-No Report  Result Date: 10/04/2021 Fluoroscopy was utilized by the requesting physician.  No radiographic interpretation.   DG C-Arm 1-60 Min-No Report  Result Date: 10/04/2021 Fluoroscopy was utilized by the requesting physician.  No radiographic interpretation.   DG Hip Unilat W or Wo Pelvis 2-3 Views Right  Result Date: 10/02/2021 CLINICAL DATA:  Fall. EXAM: DG HIP (WITH OR WITHOUT PELVIS) 2-3V RIGHT COMPARISON:  None. FINDINGS: The bones are osteopenic. There is an acute comminuted right femoral intratrochanteric fracture with superolateral dislocation of the distal fracture fragment. There is no evidence for dislocation. Left-sided hip screw is partially visualized and appears uncomplicated. IMPRESSION: 1. Displaced comminuted right femoral intratrochanteric fracture. Electronically Signed   By: Ronney Asters M.D.   On: 10/02/2021 20:27   DG FEMUR, MIN 2 VIEWS RIGHT  Result Date: 10/05/2021 CLINICAL DATA:  Postop EXAM: RIGHT FEMUR 2 VIEWS COMPARISON:  10/02/2021 FINDINGS: Internal fixation across the proximal right  femoral intertrochanteric fracture. No hardware complicating feature. Mild residual displacement across the fracture fragments. No subluxation or dislocation. IMPRESSION: Internal fixation with mild continued displacement. No hardware complicating  feature. Electronically Signed   By: Rolm Baptise M.D.   On: 10/05/2021 00:24   DG FEMUR, MIN 2 VIEWS RIGHT  Result Date: 10/04/2021 CLINICAL DATA:  Intramedullary nail. EXAM: RIGHT FEMUR 2 VIEWS COMPARISON:  Preoperative radiographs 10/02/2021 FINDINGS: Six fluoroscopic spot views of the right femur obtained in the operating room in frontal and lateral projections. Lung intramedullary nail with distal locking and trans trochanteric screw fixation of intertrochanteric femur fracture. Improved fracture alignment from preoperative imaging. Fluoroscopy time 4 minutes 41 seconds. Dose 14.13 mGy. IMPRESSION: Intraoperative fluoroscopy during right femur fracture ORIF. Electronically Signed   By: Keith Rake M.D.   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   -CBC from this morning showed a WBC of 17.8, hemoglobin 7.0, MCV 101.4, platelets 562,000.  T bili was elevated on admission at 3.1 which is overall consistent with prior values.  -Unclear if labs are being drawn and prewarmed tubes and being transported at 37 degrees for processing.  If this is not happening, this may lead to an underestimation of her actual hemoglobin level. -Would recommend CBC and all hemolytic labs to be collected in prewarmed tubes at 37 degrees and samples to be run at 37C to get accurate results. -Would recommend continuing B12 2000 mcg daily and folic acid 2 mg p.o. daily. -The patient needs to be kept in a warm room and be dressed and warm cloths from head to toe limiting any open skin. -All IV fluids and blood products need to be given through a blood warmer. -Transfuse to maintain hemoglobin more than 7 -Plan to continue  Rituxan as an outpatient (patient received 3 out of 4 weekly doses of Rituxan) .Marland Kitchen  We shall follow-up with the patient as outpatient to determine if the fourth weekly dose of Rituxan is warranted based on her goals of care. -We anticipate some of the anemia is related to blood loss with surgery and hematoma from her femoral fracture.  Also surgery and inflammation might up regulate her cold agglutinins temporarily. -Continue to keep the patient's room warm and cover as much of her skin with warm clothing as possible. -Her cold agglutinin disease does not change recommendations regarding postoperative Lovenox thromboprophylaxis.  However, we will defer risk stratification of CNS bleed to hospitalist/neurologist. -Would strongly recommend goals of care discussion and palliative care input regarding plan for long-term management of her advanced dementia and goals of care.  Mikey Bussing, DNP, AGPCNP-BC, AOCNP    ADDENDUM .Patient was Personally and independently interviewed, examined and relevant elements of the history of present illness were reviewed in details and an assessment and plan was created. All elements of the patient's history of present illness , assessment and plan were discussed in details with Mikey Bussing, DNP, AGPCNP-BC, AOCNP  . The above documentation reflects our combined findings assessment and plan. We shall continue to see the patient as outpatient for continued transfusion needs and chronic management of her cold agglutinin disease . -Please set her up to see [place] with repeat labs and  appointment for 1 unit of PRBCs as outpatient in 1 to 2 weeks after hospital discharge.  Sullivan Lone MD MS

## 2021-10-08 NOTE — Evaluation (Signed)
Occupational Therapy Evaluation Patient Details Name: [name] MRN: [id number] DOB: Oct 21, 1940 Today's Date: 10/08/2021   History of Present Illness Patient is a 81 year old female admitted 1/10 after a fall.    X-ray showed right hip fracture.  CT head also showed small intraventricular hemorrhage.  Pt underwent right IM nail 1/12.  PMH: chronic cold agglutinin hemolytic anemia, dementia .   Clinical Impression   Per chart review from last admission, pt was walking with RW and min assist. She was living with her mother who was her caregiver. Pt presents with significant cognitive deficits from dementia, generalized weakness, pain and poor sitting balance requiring mod assist. Pt requires +2 total assist to squat pivot to chair. She needed total assist wash her hands and min assist for self feeding. Pt needs max to total assist for bathing, dressing and toileting. Recommending SNF. Will defer OT to SNF.     Recommendations for follow up therapy are one component of a multi-disciplinary discharge planning process, led by the attending physician.  Recommendations may be updated based on patient status, additional functional criteria and insurance authorization.   Follow Up Recommendations  Skilled nursing-short term rehab (<3 hours/day)    Assistance Recommended at Discharge Frequent or constant Supervision/Assistance  Patient can return home with the following Two people to help with walking and/or transfers;Two people to help with bathing/dressing/bathroom;Direct supervision/assist for medications management;Direct supervision/assist for financial management;Assist for transportation;Help with stairs or ramp for entrance;Assistance with cooking/housework;Assistance with feeding;A lot of help with bathing/dressing/bathroom    Functional Status Assessment  Patient has had a recent decline in their functional status and/or demonstrates limited ability to make significant improvements  in function in a reasonable and predictable amount of time  Equipment Recommendations  None recommended by OT (defer to next venue)    Recommendations for Other Services       Precautions / Restrictions Precautions Precautions: Fall Precaution Comments: VAC to right LE Restrictions Weight Bearing Restrictions: No RLE Weight Bearing: Weight bearing as tolerated      Mobility Bed Mobility Overal bed mobility: Needs Assistance Bed Mobility: Supine to Sit     Supine to sit: +2 for physical assistance;Total assist     General bed mobility comments: assist for all aspects    Transfers Overall transfer level: Needs assistance Equipment used: 2 person hand held assist Transfers: Bed to chair/wheelchair/BSC     Squat pivot transfers: +2 physical assistance;Total assist       General transfer comment: attempted use of stedy, pt unable to tolerate weight on R LE, squat pivot to chair with use of bed pad under hips      Balance Overall balance assessment: Needs assistance Sitting-balance support: No upper extremity supported;Feet supported Sitting balance-Leahy Scale: Zero Sitting balance - Comments: mod assist Postural control: Left lateral lean   Standing balance-Leahy Scale: Zero                             ADL either performed or assessed with clinical judgement   ADL Overall ADL's : Needs assistance/impaired Eating/Feeding: Minimal assistance;Sitting   Grooming: Wash/dry hands;Sitting;Total assistance Grooming Details (indicate cue type and reason): attempted to put hand wipe in her mouth Upper Body Bathing: Total assistance;Sitting   Lower Body Bathing: Total assistance;Bed level   Upper Body Dressing : Maximal assistance;Sitting   Lower Body Dressing: Total assistance;Bed level       Toileting- Clothing Manipulation and Hygiene: Total assistance;Bed level  Vision Ability to See in Adequate Light: 0 Adequate Patient  Visual Report: No change from baseline       Perception     Praxis      Pertinent Vitals/Pain Pain Assessment: Faces Faces Pain Scale: Hurts even more Pain Location: right hip Pain Descriptors / Indicators: Grimacing;Guarding Pain Intervention(s): Monitored during session;Repositioned     Hand Dominance Right   Extremity/Trunk Assessment Upper Extremity Assessment Upper Extremity Assessment: Overall WFL for tasks assessed;Generalized weakness   Lower Extremity Assessment Lower Extremity Assessment: Defer to PT evaluation   Cervical / Trunk Assessment Cervical / Trunk Assessment: Kyphotic   Communication Communication Communication: Expressive difficulties   Cognition Arousal/Alertness: Awake/alert Behavior During Therapy: Flat affect Overall Cognitive Status: History of cognitive impairments - at baseline                                 General Comments: off topic comments throughout session, word finding deficits     General Comments       Exercises     Shoulder Instructions      Home Living Family/patient expects to be discharged to:: Skilled nursing facility Living Arrangements: Parent                               Additional Comments: was living with her mother, per last admission, pt was walking with RW and min assist      Prior Functioning/Environment                          OT Problem List: Decreased strength;Impaired balance (sitting and/or standing);Decreased cognition;Decreased knowledge of use of DME or AE;Pain      OT Treatment/Interventions:      OT Goals(Current goals can be found in the care plan section)    OT Frequency:      Co-evaluation              AM-PAC OT "6 Clicks" Daily Activity     Outcome Measure Help from another person eating meals?: A Little Help from another person taking care of personal grooming?: Total Help from another person toileting, which includes using toliet,  bedpan, or urinal?: Total Help from another person bathing (including washing, rinsing, drying)?: Total Help from another person to put on and taking off regular upper body clothing?: A Lot Help from another person to put on and taking off regular lower body clothing?: Total 6 Click Score: 9   End of Session Nurse Communication: Mobility status  Activity Tolerance: Patient tolerated treatment well Patient left: in chair;with call bell/phone within reach;with chair alarm set  OT Visit Diagnosis: Unsteadiness on feet (R26.81);Pain;Other symptoms and signs involving cognitive function                Time: 1212-1308 OT Time Calculation (min): 56 min Charges:  OT General Charges $OT Visit: 1 Visit OT Evaluation $OT Eval Moderate Complexity: 1 Mod OT Treatments $Self Care/Home Management : 8-22 mins  Nestor Lewandowsky, OTR/L Acute Rehabilitation Services Pager: [phone] Office: [phone]   Malka So 10/08/2021, 1:39 PM

## 2021-10-08 NOTE — Progress Notes (Signed)
This nurse notified MD [name], Ripudeep about pt MAP at 75.

## 2021-10-08 NOTE — Progress Notes (Signed)
Progress Note   Patient: [name] WUJ:[id number] DOB: 01-22-1941 DOA: 10/02/2021     6 DOS: the patient was seen and examined on 10/08/2021   Brief hospital course: Patient is a 81 year old female with history of chronic cold agglutinin hemolytic anemia, dementia presented with fall 3 days ago.  Patient was complaining of hip and chest pain 2 days later.  X-ray showed right hip fracture.  CT head also showed small intraventricular hemorrhage Dr. Hal Hope discussed with Dr. Venetia Constable, neurosurgery, at this time no neurosurgical interventions needed.  1/11: Received 2 units packed RBCs (warm) for hemoglobin of 6.9 1/12: Overnight agitated, will resume Seroquel.  H&H stable, underwent ORIF 1/13: H&H stable, dementia with confusion, Seroquel increased to 50 mg nightly 1/14: Sound asleep, sitter at the bedside, stated that she slept around 7:30 AM 1/15: Hemoglobin 6.3, transfuse 1 unit packed RBCs 1/16: Hemoglobin 7.0, requested hematology consult  Assessment and Plan * Closed right hip fracture ([place])- (present on admission) - Secondary to mechanical fall - Status post ORIF, on 10/04/2021 -Management per orthopedics, Lovenox decreased to 30 mg daily per orthopedic recommendation on 1/15.  Left 9th rib fracture- (present on admission) - Continue incentive spirometry if patient is able to comply.  Intraventricular hemorrhage ([facility]) - Due to fall.  CT head showed small intraventricular hemorrhage.  Admitting physician, Dr. Hal Hope discussed with Dr. Venetia Constable (neurosurgery) who recommended no intervention needed at this time.  -No acute issues, alert, pleasantly confused, no new FND's, appears close to her baseline  Symptomatic anemia- (present on admission) - Hemoglobin 6.9, on 1/11 possibly due to the fracture, intraventricular hemorrhage, discussed with hematology, Dr Irene Limbo. S/p 2 units packed RBCs. 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  Has been following with Dr. Irene Limbo -Status post 3 units of packed RBCs, 2 on 1/11, 1 on 1/15 -Hemoglobin trended up to 7.3 now again down to 7.0.   - Requested for formal consultation from hem-oncology, Dr Irene Limbo   COPD (chronic obstructive pulmonary disease) ([place])- (present on admission) - No wheezing, currently stable -Chest x-ray showed stable cardiomegaly, left basilar atelectasis and/or small pleural effusion  Fall- (present on admission) - PT evaluation done, recommended SNF  Dementia ([place])- (present on admission) - Patient was much more drowsy after Seroquel was increased to 50 mg however had not slept for 2 days before.  -Continue Seroquel 25 mg daily qPM, melatonin for sleeping     Subjective: No acute complaints, pain controlled.  Fairly alert, appears close to her baseline.  Objective Vitals:   10/08/21 0850 10/08/21 1121  BP: (!) 147/50 (!) 106/41  Pulse: 84 60  Resp: 17 16  Temp: 98 F (36.7 C) 98 F (36.7 C)  SpO2: 96% 99%    Physical Exam General: Alert and oriented x self, NAD, pleasantly confused, dementia Cardiovascular: S1 S2 clear, RRR. No pedal edema b/l Respiratory: CTAB Gastrointestinal: Soft, nontender, nondistended, NBS Ext: no pedal edema bilaterally Neuro: no new FND's Psych: Normal affect and demeanor, alert and oriented x3    Data Reviewed: CBC Latest Ref Rng & Units 10/08/2021 10/07/2021 10/07/2021  WBC 4.0 - 10.5 K/uL 17.8(H) - 15.6(H)  Hemoglobin 12.0 - 15.0 g/dL 7.0(L) 7.3(L) 6.3(LL)  Hematocrit 36.0 - 46.0 % 21.3(L) 20.5(L) 18.8(L)  Platelets 150 - 400 K/uL 562(H) - 552(H)     Family Communication:   Disposition: Status is: Inpatient  Remains inpatient appropriate because:  Awaiting skilled nursing facility.  Hopefully DC in a.m. if H&H remained stable.    Time spent: 35 minutes  Author: Estill Cotta, MD 10/08/2021 2:45 PM  For on call review [email or web address].

## 2021-10-08 NOTE — Progress Notes (Signed)
Verbal order from MD [name], Ripudeep to place purewick for this pt.

## 2021-10-08 NOTE — Progress Notes (Addendum)
Oncology Discharge Planning Admission Note  [facility] at [facility] Address: [address], [place], [place] [address] Hours of Operation:  8am - 5pm, Monday - Friday  Clinic Contact Information:  [phone]) [phone]  Oncology Care Team: Medical Oncologist:  Dr. Sullivan Lone  Dr. Irene Limbo is aware of this hospital admission dated 09/1021 and has assessed at the bedsid.  The cancer center will follow [facility] inpatient care to assist with discharge planning as indicated by the oncologist.  We will reach out closer to discharge date to arrange follow up care.  Disclaimer:  This [place] note does not imply a formal consult request has been made by the admitting attending for this admission or there will be an inpatient consult completed by oncology.  Please request oncology consults as per standard process as indicated.

## 2021-10-09 LAB — RESP PANEL BY RT-PCR (FLU A&B, COVID) ARPGX2
Influenza A by PCR: NEGATIVE
Influenza B by PCR: NEGATIVE
SARS Coronavirus 2 by RT PCR: NEGATIVE

## 2021-10-09 LAB — CBC
HCT: 21.8 % — ABNORMAL LOW (ref 36.0–46.0)
Hemoglobin: 7.6 g/dL — ABNORMAL LOW (ref 12.0–15.0)
MCH: 33.6 pg (ref 26.0–34.0)
MCHC: 34.9 g/dL (ref 30.0–36.0)
MCV: 96.5 fL (ref 80.0–100.0)
Platelets: 560 10*3/uL — ABNORMAL HIGH (ref 150–400)
RBC: 2.26 MIL/uL — ABNORMAL LOW (ref 3.87–5.11)
RDW: 19.3 % — ABNORMAL HIGH (ref 11.5–15.5)
WBC: 13.7 10*3/uL — ABNORMAL HIGH (ref 4.0–10.5)
nRBC: 0 % (ref 0.0–0.2)

## 2021-10-09 MED ORDER — ASPIRIN EC 325 MG PO TBEC: 325.0000 mg | DELAYED_RELEASE_TABLET | Freq: Every day | ORAL | 0 refills | Status: DC

## 2021-10-09 MED ORDER — TRAMADOL HCL 50 MG PO TABS: 50.0000 mg | ORAL_TABLET | Freq: Four times a day (QID) | ORAL | 0 refills | Status: DC | PRN

## 2021-10-09 MED ORDER — FOLIC ACID 1 MG PO TABS: 2.0000 mg | ORAL_TABLET | Freq: Every day | ORAL | Status: AC

## 2021-10-09 MED ORDER — ADULT MULTIVITAMIN W/MINERALS CH: 1.0000 | ORAL_TABLET | Freq: Every day | ORAL | Status: DC

## 2021-10-09 MED ORDER — DOCUSATE SODIUM 100 MG PO CAPS: 100.0000 mg | ORAL_CAPSULE | Freq: Two times a day (BID) | ORAL | 0 refills | Status: AC

## 2021-10-09 MED ORDER — QUETIAPINE FUMARATE 25 MG PO TABS: 25.0000 mg | ORAL_TABLET | Freq: Every day | ORAL | 0 refills | Status: DC

## 2021-10-09 MED ORDER — PANTOPRAZOLE SODIUM 40 MG PO TBEC: 40.0000 mg | DELAYED_RELEASE_TABLET | Freq: Every day | ORAL | Status: AC

## 2021-10-09 MED ORDER — MELATONIN 3 MG PO TABS: 3.0000 mg | ORAL_TABLET | Freq: Every evening | ORAL | 0 refills | Status: DC | PRN

## 2021-10-09 NOTE — TOC Progression Note (Signed)
Transition of Care [facility]) - Progression Note    Patient Details  Name: [name] MRN: [id number] Date of Birth: [birth year]  [facility] CM/SW Contact  Joanne Chars, LCSW Phone Number: 10/09/2021, 11:42 AM  Clinical Narrative:     CSW spoke with pt sister [name] about bed offers: [facility] does not have available bed, [facility] does not either.  [name] will accept [facility] bed offer.  Janey at [facility] confirmed they can accept pt and asked that [name] come at 10am tomorrow for paperwork.  Facility choice updated in [place] approved, XMD#[id number], 3 days, 1/16-1/18.  Status says "approved-hold"  CSW contacted [employer] regarding this and was told [place] does this and the facility has to respond when the pt arrives for the full auth.  Nothing more needed on hospital end.        Expected Discharge Plan: [place] Barriers to Discharge: Continued Medical Work up  Expected Discharge Plan and Services Expected Discharge Plan: [place]   Discharge Planning Services: CM Consult   Living arrangements for the past 2 months: Single Family Home                                       Social Determinants of Health (SDOH) Interventions    Readmission Risk Interventions Readmission Risk Prevention Plan 08/20/2021  Transportation Screening Complete  PCP or Specialist Appt within 5-7 Days Complete  Home Care Screening Complete  Medication Review (RN CM) Complete  Some recent data might be hidden

## 2021-10-09 NOTE — Progress Notes (Signed)
Dsicharge summary packet with pertinent documents provided to PTAR. Pt d/c to [facility] as ordered. Pt remains alert/oriented to self only in no apparent distress.

## 2021-10-09 NOTE — Progress Notes (Signed)
Report given to [facility] nurse at [facility] Dressing care/Prevena has been extensively discussed. All questions and concerns were fully answered.

## 2021-10-09 NOTE — Discharge Summary (Signed)
Physician Discharge Summary   Patient: [name] MRN: [id number] DOB: [birth year]  Admit date:     10/02/2021  Discharge date: 10/09/21  Discharge Physician: Estill Cotta   PCP: [name], MD   Recommendations at discharge:   Aspirin 325 mg daily for 5 weeks Weightbearing as tolerated right lower extremity Fall precautions, out of bed with PT OT High risk for falls and readmissions due to advanced age, dementia, medical comorbidities Please check all labs (CBC, any hemolysis labs ) in prewarmed tubes at 37 degrees.  Patient needs to stay warm due to history of idiopathic chronic cold agglutinin hemolytic disease.  Discharge Diagnoses    Closed right hip fracture ([place])   Dementia ([place]) with sundowning    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H[facility])   Symptomatic anemia   Intraventricular hemorrhage (HCC)   Left 9th rib fracture   Fall   COPD [facility]chronic [facility] pulmonary [facility]) [facility])     Hospital Course   Patient is a 81 year old female with history of chronic cold agglutinin hemolytic anemia, dementia presented with fall 3 days ago.  Patient was complaining of hip and chest pain 2 days later.  X-ray showed right hip fracture.  CT head also showed small intraventricular hemorrhage Dr. Hal Hope discussed with Dr. Venetia Constable, neurosurgery, at this time no neurosurgical interventions needed.  Patient was admitted for further work-up, underwent ORIF on 10/04/2021.  Hospital course was complicated due to symptomatic anemia requiring multiple transfusions.   * Closed right hip fracture ([place])- (present on admission) - Secondary to mechanical fall - Status post ORIF, on 10/04/2021 -Minimize sedatives and narcotics, continue PT OT.  -Per orthopedics recommendations aspirin 325 mg daily for 5 weeks for DVT prophylaxis and follow-up with patient in 2 weeks. -Weightbearing as tolerated on her right lower extremity  Left 9th rib fracture- (present on admission) -  Continue incentive spirometry if patient is able to comply.  Intraventricular hemorrhage (HCC) - Due to fall.  CT head showed small intraventricular hemorrhage.  Admitting physician, Dr. Hal Hope discussed with Dr. Venetia Constable (neurosurgery) who recommended no intervention needed at this time.  -No acute issues, alert, pleasantly confused, no new FND's, appears close to her baseline -Outpatient follow-up with Dr. Venetia Constable as needed  Symptomatic anemia- (present on admission) - Hemoglobin 6.9, on 1/11 possibly due to the fracture, intraventricular hemorrhage, discussed with hematology, Dr Irene Limbo. S/p 2 units packed RBCs. - received 3 units packed RBCs 2 on 1/11, 1 on 1/15.   -Hemoglobin currently stable 7.6, no further need of transfusion per hematology.  - Continue folic acid 2 mg daily -DVT prophylaxis with aspirin 325 mg daily for 5 weeks  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[place])- (present on admission) - Patient has a history of idiopathic chronic cold agglutinin disease and hemolytic anemia.  Has been following with Dr. Irene Limbo -Status post 3 units of packed RBCs, 2 on 1/11, 1 on 1/15 -Hemoglobin 7.6, discussed with hematology Dr Irene Limbo, no further need of transfusion if patient does not bleeding.  -Patient needs to stay warm and please check CBC and any hemolysis labs in prewarmed tubes at 37 degrees  COPD (chronic obstructive pulmonary disease) ([place])- (present on admission) - No wheezing, currently stable -Chest x-ray showed stable cardiomegaly, left basilar atelectasis and/or small pleural effusion  Fall- (present on admission) - PT evaluation done, recommended SNF.  Due to advanced dementia, frail, medical comorbidities, recent fracture, very high risk for falls.  Discussed with orthopedics, Dr. Mable Fill recommended aspirin 325 mg daily for  5 weeks  Dementia ([place])- (present on admission) - Patient was much more drowsy after Seroquel was increased to 50 mg   -Continue Seroquel  25 mg daily qPM, melatonin as needed for sleeping  No  Pain control - [place] Controlled Substance Reporting System database was reviewed. and patient was instructed, not to drive, operate heavy machinery, perform activities at heights, swimming or participation in water activities or provide baby-sitting services while on Pain, Sleep and Anxiety Medications; until their outpatient Physician has advised to do so again. Also recommended to not to take more than prescribed Pain, Sleep and Anxiety Medications.   Consultants Orthopedics Neurosurgery Hematology oncology, Dr. Irene Limbo   Procedures performed:  ORIF right hip Disposition: Skilled nursing facility Diet recommendation: Cardiac diet  DISCHARGE MEDICATION: Allergies as of 10/09/2021       Reactions   Other Other (See Comments)   "Most antibiotics give yeast infection"   Shellfish Allergy Hives        Medication List     STOP taking these medications    ALPRAZolam 0.5 MG tablet Commonly known as: XANAX   furosemide 20 MG tablet Commonly known as: LASIX   Klor-Con M20 20 MEQ tablet Generic drug: potassium chloride SA       TAKE these medications    acetaminophen 500 MG tablet Commonly known as: TYLENOL Take 500 mg by mouth every 6 (six) hours as needed for moderate pain.   aspirin EC 325 MG tablet Take 1 tablet (325 mg total) by mouth daily. X 5 weeks for DVT prophylaxis   cyanocobalamin 2000 MCG tablet Take 1 tablet (2,000 mcg total) by mouth daily.   docusate sodium 100 MG capsule Commonly known as: COLACE Take 1 capsule (100 mg total) by mouth 2 (two) times daily.   folic acid 1 MG tablet Commonly known as: FOLVITE Take 2 tablets (2 mg total) by mouth daily. Start taking on: October 10, 2021   loratadine 10 MG tablet Commonly known as: CLARITIN Take 10 mg by mouth daily as needed for allergies.   melatonin 3 MG Tabs tablet Take 1 tablet (3 mg total) by mouth at bedtime as needed  (insomnia).   multivitamin with minerals Tabs tablet Take 1 tablet by mouth daily. Start taking on: October 10, 2021   pantoprazole 40 MG tablet Commonly known as: PROTONIX Take 1 tablet (40 mg total) by mouth daily at 6 (six) AM. Start taking on: October 10, 2021   QUEtiapine 25 MG tablet Commonly known as: SEROquel Take 1 tablet (25 mg total) by mouth at bedtime.   traMADol 50 MG tablet Commonly known as: ULTRAM Take 1 tablet (50 mg total) by mouth every 6 (six) hours as needed for moderate pain or severe pain.               Discharge Care Instructions  (From admission, onward)           Start     Ordered   10/09/21 0000  If the dressing is still on your incision site when you go home, remove it on the third day after your surgery date. Remove dressing if it begins to fall off, or if it is dirty or damaged before the third day.        10/09/21 1342            Contact information for follow-up providers     [name], MD. Schedule an appointment as soon as possible for a visit in 2 week(s).  Specialty: Orthopedic Surgery Contact information: [address] [place] [address] [phone]         [name], MD. Schedule an appointment as soon as possible for a visit in 2 week(s).   Specialty: Internal Medicine Why: for hospital follow-up Contact information: [place], [place] [address] [phone]         Brunetta Genera, MD. Schedule an appointment as soon as possible for a visit in 2 week(s).   Specialties: Hematology, Oncology Why: obtain labs, CBC Contact information: [place] [address] [phone]              Contact information for after-discharge care     [facility] .   Service: Skilled Nursing Contact information: [place] [phone]                      Discharge Exam: Danley Danker Weights   10/02/21 1758 10/04/21 1254  Weight: 41.7 kg 42.6 kg   S: Pleasantly confused, no acute issues  BP (!) 117/38 (BP Location: Right Arm)    Pulse 60    Temp 98.1 F (36.7 C) (Oral)    Resp 16    Ht 5\' 1"  (1.549 m)    Wt 42.6 kg    SpO2 100%    BMI 17.76 kg/m     Physical Exam General: Alert and awake, appears comfortable Cardiovascular: S1 S2 clear, RRR. Respiratory: CTAB, no wheezing, rales or rhonchi Gastrointestinal: Soft, nontender, nondistended, NBS Ext: no pedal edema bilaterally Psych: confused, dementia, oriented to herself   Condition at discharge: fair  The results of significant diagnostics from this hospitalization (including imaging, microbiology, ancillary and laboratory) are listed below for reference.   Imaging Studies: DG Ribs Unilateral W/Chest Left  Result Date: 10/02/2021 CLINICAL DATA:  Fall, chest pain. EXAM: LEFT RIBS AND CHEST - 3+ VIEW COMPARISON:  Chest x-ray 08/10/2021. FINDINGS: Cardiomediastinal silhouette is stable, the heart is mildly enlarged. There are minimal patchy opacities in the left costophrenic angle. There may be a small left pleural effusion. There is no evidence for pneumothorax. There are atherosclerotic calcifications of the aorta. There is an acute left lateral ninth rib fracture. IMPRESSION: 1. Acute fracture left ninth rib. 2. Minimal left basilar atelectasis/airspace disease with questionable small left pleural effusion. Electronically Signed   By: Ronney Asters M.D.   On: 10/02/2021 20:26   CT HEAD WO CONTRAST (5MM)  Result Date: 10/03/2021 CLINICAL DATA:  Initial evaluation for acute head trauma. EXAM: CT HEAD WITHOUT CONTRAST TECHNIQUE: Contiguous axial images were obtained from the base of the skull through the vertex without intravenous contrast. RADIATION DOSE REDUCTION: This exam was performed according to the departmental dose-optimization program which includes automated exposure  control, adjustment of the mA and/or kV according to patient size and/or use of iterative reconstruction technique. COMPARISON:  None available. FINDINGS: Brain: Moderately advanced age-related cerebral atrophy with chronic small vessel ischemic disease. Small volume acute intraventricular hemorrhage centered about the foramen of Monro measures up to 12 mm (series 4, image 15). Additional possible trace hemorrhage within the left temporal horn (series 4, image 11). Mild ventricular prominence related to global parenchymal volume loss without hydrocephalus or trapping. No other acute intracranial hemorrhage. No acute large vessel territory infarct. No mass lesion or midline shift. No extra-axial fluid collection. Vascular: No hyperdense vessel. Scattered vascular calcifications noted within the carotid  siphons. Skull: Scalp soft tissues within normal limits.  Calvarium intact. Sinuses/Orbits: Globes and orbital soft tissues demonstrate no acute finding. Paranasal sinuses are clear. No significant mastoid effusion. Other: None. IMPRESSION: 1. Small volume acute intraventricular hemorrhage measuring up to 12 mm, primarily centered about the foramen of Monro. No hydrocephalus or trapping. 2. Moderately advanced age-related cerebral atrophy with chronic small vessel ischemic disease. Critical Value/emergent results were called by telephone at the time of interpretation on 10/03/2021 at 1:40 am to provider [facility] , who verbally acknowledged these results. Electronically Signed   By: Jeannine Boga M.D.   On: 10/03/2021 01:42   DG CHEST PORT 1 VIEW  Result Date: 10/03/2021 CLINICAL DATA:  Shortness of breath, hip fracture, COPD EXAM: PORTABLE CHEST 1 VIEW COMPARISON:  Chest radiograph dated October 02, 2021 FINDINGS: The heart is enlarged. Atherosclerotic calcification of aortic arch. Elevation of the left hemidiaphragm with left basilar atelectasis and/or small left pleural effusion. The right lung is  clear. Vertebroplasty of the lower thoracic vertebral body. Previously noted left ninth rib fracture is again noted. IMPRESSION: 1. Stable cardiomegaly. 2. Elevation of the left hemidiaphragm with left basilar atelectasis and/or small pleural effusion. Electronically Signed   By: Keane Police D.O.   On: 10/03/2021 08:37   DG Knee Complete 4 Views Right  Result Date: 10/02/2021 CLINICAL DATA:  Fall. EXAM: RIGHT KNEE - COMPLETE 4+ VIEW COMPARISON:  None. FINDINGS: There is no acute fracture or dislocation of the right knee. The bones are osteopenic. There is moderate arthritic changes. No significant joint effusion. There is diffuse subcutaneous edema. IMPRESSION: No acute fracture or dislocation of the right knee. Electronically Signed   By: Anner Crete M.D.   On: 10/02/2021 20:27   DG C-Arm 1-60 Min-No Report  Result Date: 10/04/2021 Fluoroscopy was utilized by the requesting physician.  No radiographic interpretation.   DG C-Arm 1-60 Min-No Report  Result Date: 10/04/2021 Fluoroscopy was utilized by the requesting physician.  No radiographic interpretation.   DG C-Arm 1-60 Min-No Report  Result Date: 10/04/2021 Fluoroscopy was utilized by the requesting physician.  No radiographic interpretation.   DG Hip Unilat W or Wo Pelvis 2-3 Views Right  Result Date: 10/02/2021 CLINICAL DATA:  Fall. EXAM: DG HIP (WITH OR WITHOUT PELVIS) 2-3V RIGHT COMPARISON:  None. FINDINGS: The bones are osteopenic. There is an acute comminuted right femoral intratrochanteric fracture with superolateral dislocation of the distal fracture fragment. There is no evidence for dislocation. Left-sided hip screw is partially visualized and appears uncomplicated. IMPRESSION: 1. Displaced comminuted right femoral intratrochanteric fracture. Electronically Signed   By: Ronney Asters M.D.   On: 10/02/2021 20:27   DG FEMUR, MIN 2 VIEWS RIGHT  Result Date: 10/05/2021 CLINICAL DATA:  Postop EXAM: RIGHT FEMUR 2 VIEWS  COMPARISON:  10/02/2021 FINDINGS: Internal fixation across the proximal right femoral intertrochanteric fracture. No hardware complicating feature. Mild residual displacement across the fracture fragments. No subluxation or dislocation. IMPRESSION: Internal fixation with mild continued displacement. No hardware complicating feature. Electronically Signed   By: Rolm Baptise M.D.   On: 10/05/2021 00:24   DG FEMUR, MIN 2 VIEWS RIGHT  Result Date: 10/04/2021 CLINICAL DATA:  Intramedullary nail. EXAM: RIGHT FEMUR 2 VIEWS COMPARISON:  Preoperative radiographs 10/02/2021 FINDINGS: Six fluoroscopic spot views of the right femur obtained in the operating room in frontal and lateral projections. Lung intramedullary nail with distal locking and trans trochanteric screw fixation of intertrochanteric femur fracture. Improved fracture alignment from preoperative imaging. Fluoroscopy time 4  minutes 41 seconds. Dose 14.13 mGy. IMPRESSION: Intraoperative fluoroscopy during right femur fracture ORIF. Electronically Signed   By: Keith Rake M.D.   On: 10/04/2021 16:47    Microbiology: Results for orders placed or performed during the hospital encounter of 10/02/21  Resp Panel by RT-PCR (Flu A&B, Covid) Nasopharyngeal Swab     Status: None   Collection Time: 10/02/21  9:34 PM   Specimen: Nasopharyngeal Swab; Nasopharyngeal(NP) swabs in vial transport medium  Result Value Ref Range Status   SARS Coronavirus 2 by RT PCR NEGATIVE NEGATIVE Final    Comment: (NOTE) SARS-CoV-2 target nucleic acids are NOT DETECTED.  The SARS-CoV-2 RNA is generally detectable in upper respiratory specimens during the acute phase of infection. The lowest concentration of SARS-CoV-2 viral copies this assay can detect is 138 copies/mL. A negative result does not preclude SARS-Cov-2 infection and should not be used as the sole basis for treatment or other patient management decisions. A negative result may occur with  improper specimen  collection/handling, submission of specimen other than nasopharyngeal swab, presence of viral mutation(s) within the areas targeted by this assay, and inadequate number of viral copies(<138 copies/mL). A negative result must be combined with clinical observations, patient history, and epidemiological information. The expected result is Negative.  Fact Sheet for Patients:  EntrepreneurPulse.com.au  Fact Sheet for Healthcare Providers:  IncredibleEmployment.be  This test is no t yet approved or cleared by the [place] FDA and  has been authorized for detection and/or diagnosis of SARS-CoV-2 by FDA under an Emergency Use Authorization (EUA). This EUA will remain  in effect (meaning this test can be used) for the duration of the COVID-19 declaration under Section 564(b)(1) of the Act, 21 U.S.C.section 360bbb-3(b)(1), unless the authorization is terminated  or revoked sooner.       Influenza A by PCR NEGATIVE NEGATIVE Final   Influenza B by PCR NEGATIVE NEGATIVE Final    Comment: (NOTE) The Xpert Xpress SARS-CoV-2/FLU/RSV plus assay is intended as an aid in the diagnosis of influenza from Nasopharyngeal swab specimens and should not be used as a sole basis for treatment. Nasal washings and aspirates are unacceptable for Xpert Xpress SARS-CoV-2/FLU/RSV testing.  Fact Sheet for Patients: EntrepreneurPulse.com.au  Fact Sheet for Healthcare Providers: IncredibleEmployment.be  This test is not yet approved or cleared by the [place] FDA and has been authorized for detection and/or diagnosis of SARS-CoV-2 by FDA under an Emergency Use Authorization (EUA). This EUA will remain in effect (meaning this test can be used) for the duration of the COVID-19 declaration under Section 564(b)(1) of the Act, 21 U.S.C. section 360bbb-3(b)(1), unless the authorization is terminated or revoked.  Performed at [facility], [place] [address], [place], [place] [address]   Surgical PCR screen     Status: Abnormal   Collection Time: 10/04/21 12:25 PM   Specimen: Nasal Mucosa; Nasal Swab  Result Value Ref Range Status   MRSA, PCR NEGATIVE NEGATIVE Final   Staphylococcus aureus POSITIVE (A) NEGATIVE Final    Comment: (NOTE) The Xpert SA Assay (FDA approved for NASAL specimens in patients 19 years of age and older), is one component of a comprehensive surveillance program. It is not intended to diagnose infection nor to guide or monitor treatment. Performed at [facility], [place] [address], [place], [place] [address]   Resp Panel by RT-PCR (Flu A&B, Covid) Nasopharyngeal Swab     Status: None   Collection Time: 10/09/21 11:23 AM   Specimen: Nasopharyngeal  Swab; Nasopharyngeal(NP) swabs in vial transport medium  Result Value Ref Range Status   SARS Coronavirus 2 by RT PCR NEGATIVE NEGATIVE Final    Comment: (NOTE) SARS-CoV-2 target nucleic acids are NOT DETECTED.  The SARS-CoV-2 RNA is generally detectable in upper respiratory specimens during the acute phase of infection. The lowest concentration of SARS-CoV-2 viral copies this assay can detect is 138 copies/mL. A negative result does not preclude SARS-Cov-2 infection and should not be used as the sole basis for treatment or other patient management decisions. A negative result may occur with  improper specimen collection/handling, submission of specimen other than nasopharyngeal swab, presence of viral mutation(s) within the areas targeted by this assay, and inadequate number of viral copies(<138 copies/mL). A negative result must be combined with clinical observations, patient history, and epidemiological information. The expected result is Negative.  Fact Sheet for Patients:  EntrepreneurPulse.com.au  Fact Sheet for Healthcare Providers:  IncredibleEmployment.be  This test is no t yet approved  or cleared by the [place] FDA and  has been authorized for detection and/or diagnosis of SARS-CoV-2 by FDA under an Emergency Use Authorization (EUA). This EUA will remain  in effect (meaning this test can be used) for the duration of the COVID-19 declaration under Section 564(b)(1) of the Act, 21 U.S.C.section 360bbb-3(b)(1), unless the authorization is terminated  or revoked sooner.       Influenza A by PCR NEGATIVE NEGATIVE Final   Influenza B by PCR NEGATIVE NEGATIVE Final    Comment: (NOTE) The Xpert Xpress SARS-CoV-2/FLU/RSV plus assay is intended as an aid in the diagnosis of influenza from Nasopharyngeal swab specimens and should not be used as a sole basis for treatment. Nasal washings and aspirates are unacceptable for Xpert Xpress SARS-CoV-2/FLU/RSV testing.  Fact Sheet for Patients: EntrepreneurPulse.com.au  Fact Sheet for Healthcare Providers: IncredibleEmployment.be  This test is not yet approved or cleared by the [place] FDA and has been authorized for detection and/or diagnosis of SARS-CoV-2 by FDA under an Emergency Use Authorization (EUA). This EUA will remain in effect (meaning this test can be used) for the duration of the COVID-19 declaration under Section 564(b)(1) of the Act, 21 U.S.C. section 360bbb-3(b)(1), unless the authorization is terminated or revoked.  Performed at [facility], [place] [address], [place], [place] [address]     Labs: CBC: Recent Labs  Lab 10/02/21 1844 10/03/21 0655 10/04/21 0343 10/05/21 0240 10/07/21 0233 10/07/21 1835 10/08/21 0120 10/09/21 0716  WBC 18.7*   < > 20.2* 22.3* 15.6*  --  17.8* 13.7*  NEUTROABS 14.6*  --   --   --   --   --   --   --   HGB 7.3*   < > 10.9* 8.8* 6.3* 7.3* 7.0* 7.6*  HCT 20.9*   < > 29.2* Unable to determine due to a cold agglutinin 18.8* 20.5* 21.3* 21.8*  MCV 95.9   < > 97.0 Unable to determine due to a cold agglutinin 99.5  --   101.4* 96.5  PLT 659*   < > 438* 569* 552*  --  562* 560*   < > = values in this interval not displayed.   Basic Metabolic Panel: Recent Labs  Lab 10/02/21 1844 10/03/21 0234 10/05/21 0240 10/07/21 0233  NA 135 134* 135 135  K 4.6 4.8 4.9 4.3  CL 94* 95* 98 99  CO2 32 28 27 31   GLUCOSE 107* 117* 116* 110*  BUN 15 14 20 15   CREATININE 0.79  0.59 0.71 0.56  CALCIUM 7.8* 7.7* 7.7* 7.8*   Liver Function Tests: Recent Labs  Lab 10/02/21 1844  AST 29  ALT 21  ALKPHOS 150*  BILITOT 3.1*  PROT 6.2*  ALBUMIN 3.1*   CBG: No results for input(s): GLUCAP in the last 168 hours.  Discharge time spent: greater than 30 minutes.  Signed: Estill Cotta, MD [facility] 10/09/2021

## 2021-10-09 NOTE — Progress Notes (Signed)
Orthopaedics Daily Progress Note   10/09/2021   1:41 PM  [name] is a 81 y.o. female 5 Days Post-Op s/p INTRAMEDULLARY (IM) NAIL INTERTROCHANTRIC  Subjective Resting comfortably. Pain apparently well controled.    Objective Vitals:   10/08/21 2125 10/09/21 0817  BP: 104/70 (!) 117/38  Pulse: 82 60  Resp: 17 16  Temp: 98.5 F (36.9 C) 98.1 F (36.7 C)  SpO2:  100%    Intake/Output Summary (Last 24 hours) at 10/09/2021 1341 Last data filed at 10/08/2021 2225 Gross per 24 hour  Intake --  Output 300 ml  Net -300 ml     Physical Exam Resting comfortably RLE: iVAC intact, holding suction WWP distally  Assessment 81 y.o. female s/p Procedure(s) (LRB): INTRAMEDULLARY (IM) NAIL INTERTROCHANTRIC (Right)  Plan Mobility: Out of bed with PT/OT Pain control: Continue to wean/titrate to appropriate oral regimen DVT Prophylaxis: ASA 325 mg daily x5 weeks Further surgical plans: None RUE: No restrictions LUE: No restrictions RLE: Weightbearing as tolerated LLE: No restrictions Disposition: Per primary team when medically appropriate Dressing care: The PREVENA incisional VAC (iVAC) should be left on for 7 days and then removed (I.e. on POD7, on 10/11/2021).  After that the wounds may be dressed with dry sterile gauze dressing and Tegaderm (clear adhesive plastic squares).  If the PREVENA hose is connected to a large VAC machine in the hospital, this should be switched to the small portable PREVENA unit prior to discharge.  Please make sure a responsible party understands how to operate the [facility] unit before discharge, including how to charge the unit.  Please note that the [facility] dressing will ONLY work if connected to suction!  If the [facility] unit fails and cannot be fixed/restored to normal operation to restore suction, the sponge and plastic dressing should be removed and replaced with gauze/Tegaderm.  If the dressing loses suction due to a leak attempt to seal the leak  with Tegaderm or remove the sponge plastic dressing replace with gauze/Tegaderm. Follow-up: Please call [facility] [phone]) to schedule follow-op appointment for 2 weeks after surgery. I have verified that my discharge instructions and follow-up information has been entered in the Discharge Navigator in Epic.  These should automatically populate in the AVS.  Please print the AVS in its entirety and ensure that the patient or a responsible party has a complete copy of the AVS before they are discharged.  If there are questions regarding discharge instructions or follow-up before the AVS is generated, please check the Discharge Navigator before attempting to contact the surgeon/office.  If unsure how to access the Discharge Navigator or the information contained in the Discharge Navigator, or how to generate/print the AVS, please contact the appropriate Nurse, learning disability.    [name] M.D. Orthopaedic Surgery [facility]

## 2021-10-09 NOTE — Care Management Important Message (Signed)
Important Message  Patient Details  Name: [name] MRN: [id number] Date of Birth: [birth year]   Medicare Important Message Given:  Yes     [name] 10/09/2021, 4:24 PM

## 2021-10-09 NOTE — TOC Transition Note (Signed)
Transition of Care [facility]) - CM/SW Discharge Note   Patient Details  Name: [name] MRN: [id number] Date of Birth: [birth year]  [facility] CM/SW Contact:  Joanne Chars, LCSW Phone Number: 10/09/2021, 2:12 PM   Clinical Narrative:   Pt discharging to [facility] room 209.  RN call report to [phone]. Sister kay requesting call when pt is picked up if possible.     Final next level of care: Skilled Nursing Facility Barriers to Discharge: Barriers Resolved   Patient Goals and CMS Choice        Discharge Placement              Patient chooses bed at:  ([facility]) Patient to be transferred to facility by: [facility] Name of family member notified: sister [name] Patient and family notified of of transfer: 10/09/21  Discharge Plan and Services   Discharge Planning Services: CM Consult                                 Social Determinants of Health ([place]) Interventions     Readmission Risk Interventions Readmission Risk Prevention Plan 08/20/2021  Transportation Screening Complete  PCP or Specialist Appt within 5-7 Days Complete  Home Care Screening Complete  Medication Review (RN CM) Complete  Some recent data might be hidden

## 2021-10-10 ENCOUNTER — Encounter: Payer: Self-pay | Admitting: *Deleted

## 2021-10-10 ENCOUNTER — Telehealth: Payer: Self-pay | Admitting: Hematology

## 2021-10-10 NOTE — Progress Notes (Unsigned)
Oncology Discharge Planning Note  [facility] Address: [address], [place], [place] [address] Hours of Operation:  Nena Polio, Monday - Friday  Clinic Contact Information:  [phone]) [phone]  Oncology Care Team: Medical Oncologist:  Dr. Irene Limbo  Patient Details: Name:  [name], [name] MRN:   [id number] DOB:   08-08-1941 Reason for Current Admission: @PPROB @  Discharge Planning Narrative: Discharge follow-up appointments for oncology will be made for 7-10 days with labs/visit and 1 unit PRBC, per Myrtha Mantis, NP.  Will follow up to ensure these appointments have been made. Upon discharge from the hospital, hematology/oncology's post discharge plan of care for the outpatient setting is: outlined above.   Franziska Podgurski will be called within two business days after discharge to review hematology/oncology's plan of care for full understanding.    Outpatient Oncology Specific Care Only: Oncology appointment transportation needs addressed?:  not applicable Oncology medication management for symptom management addressed?:  not applicable Chemo Alert Card reviewed?:  not applicable Immunotherapy Alert Card reviewed?:  not applicable

## 2021-10-10 NOTE — Telephone Encounter (Signed)
Scheduled appointment per 1/17 scheduling message. Spoke with Ms.Rhonda. Ms.Rhonda called in earlier this about getting appointments made for the patient. Ms.Rhonda is aware of patients upcoming appointments.

## 2021-10-11 LAB — BPAM RBC
Blood Product Expiration Date: 202302032359
Blood Product Expiration Date: 202302032359
ISSUE DATE / TIME: 202301151157
Unit Type and Rh: 5100
Unit Type and Rh: 5100

## 2021-10-11 LAB — TYPE AND SCREEN
ABO/RH(D): B POS
Antibody Screen: NEGATIVE
DAT, IgG: NEGATIVE
Unit division: 0
Unit division: 0

## 2021-10-16 ENCOUNTER — Other Ambulatory Visit: Payer: Self-pay

## 2021-10-16 ENCOUNTER — Inpatient Hospital Stay: Payer: Medicare Other

## 2021-10-16 ENCOUNTER — Inpatient Hospital Stay (HOSPITAL_BASED_OUTPATIENT_CLINIC_OR_DEPARTMENT_OTHER): Payer: Medicare Other | Admitting: Hematology

## 2021-10-16 ENCOUNTER — Telehealth: Payer: Self-pay

## 2021-10-16 VITALS — BP 101/37 | HR 80 | Temp 97.5°F | Resp 18

## 2021-10-16 DIAGNOSIS — Z79899 Other long term (current) drug therapy: Secondary | ICD-10-CM | POA: Diagnosis not present

## 2021-10-16 DIAGNOSIS — D472 Monoclonal gammopathy: Secondary | ICD-10-CM | POA: Diagnosis not present

## 2021-10-16 DIAGNOSIS — D5912 Cold autoimmune hemolytic anemia: Secondary | ICD-10-CM

## 2021-10-16 DIAGNOSIS — Z87891 Personal history of nicotine dependence: Secondary | ICD-10-CM | POA: Diagnosis not present

## 2021-10-16 DIAGNOSIS — I2510 Atherosclerotic heart disease of native coronary artery without angina pectoris: Secondary | ICD-10-CM | POA: Diagnosis not present

## 2021-10-16 DIAGNOSIS — M545 Low back pain, unspecified: Secondary | ICD-10-CM | POA: Diagnosis not present

## 2021-10-16 DIAGNOSIS — E538 Deficiency of other specified B group vitamins: Secondary | ICD-10-CM | POA: Diagnosis not present

## 2021-10-16 DIAGNOSIS — F0390 Unspecified dementia without behavioral disturbance: Secondary | ICD-10-CM | POA: Diagnosis not present

## 2021-10-16 DIAGNOSIS — Z8719 Personal history of other diseases of the digestive system: Secondary | ICD-10-CM | POA: Diagnosis not present

## 2021-10-16 DIAGNOSIS — G4700 Insomnia, unspecified: Secondary | ICD-10-CM | POA: Diagnosis not present

## 2021-10-16 DIAGNOSIS — I700 Atherosclerosis of aorta: Secondary | ICD-10-CM | POA: Diagnosis not present

## 2021-10-16 LAB — CBC WITH DIFFERENTIAL (CANCER CENTER ONLY)
Abs Immature Granulocytes: 0.43 10*3/uL — ABNORMAL HIGH (ref 0.00–0.07)
Basophils Absolute: 0.1 10*3/uL (ref 0.0–0.1)
Basophils Relative: 0 %
Eosinophils Absolute: 0 10*3/uL (ref 0.0–0.5)
Eosinophils Relative: 0 %
HCT: 14.6 % — ABNORMAL LOW (ref 36.0–46.0)
Hemoglobin: 5 g/dL — CL (ref 12.0–15.0)
Immature Granulocytes: 2 %
Lymphocytes Relative: 11 %
Lymphs Abs: 2.5 10*3/uL (ref 0.7–4.0)
MCH: 32.1 pg (ref 26.0–34.0)
MCHC: 34.2 g/dL (ref 30.0–36.0)
MCV: 93.6 fL (ref 80.0–100.0)
Monocytes Absolute: 1.2 10*3/uL — ABNORMAL HIGH (ref 0.1–1.0)
Monocytes Relative: 6 %
Neutro Abs: 17.9 10*3/uL — ABNORMAL HIGH (ref 1.7–7.7)
Neutrophils Relative %: 81 %
Platelet Count: 1167 10*3/uL (ref 150–400)
RBC: 1.56 MIL/uL — ABNORMAL LOW (ref 3.87–5.11)
RDW: 17.2 % — ABNORMAL HIGH (ref 11.5–15.5)
WBC Count: 22.1 10*3/uL — ABNORMAL HIGH (ref 4.0–10.5)
nRBC: 3.5 % — ABNORMAL HIGH (ref 0.0–0.2)

## 2021-10-16 LAB — CMP (CANCER CENTER ONLY)
ALT: 21 U/L (ref 0–44)
AST: 40 U/L (ref 15–41)
Albumin: 3.7 g/dL (ref 3.5–5.0)
Alkaline Phosphatase: 227 U/L — ABNORMAL HIGH (ref 38–126)
Anion gap: 10 (ref 5–15)
BUN: 16 mg/dL (ref 8–23)
CO2: 32 mmol/L (ref 22–32)
Calcium: 8.3 mg/dL — ABNORMAL LOW (ref 8.9–10.3)
Chloride: 97 mmol/L — ABNORMAL LOW (ref 98–111)
Creatinine: 0.81 mg/dL (ref 0.44–1.00)
GFR, Estimated: >60 mL/min (ref >60)
Glucose, Bld: 106 mg/dL — ABNORMAL HIGH (ref 70–99)
Potassium: 3.3 mmol/L — ABNORMAL LOW (ref 3.5–5.1)
Sodium: 139 mmol/L (ref 135–145)
Total Bilirubin: 2.5 mg/dL — ABNORMAL HIGH (ref 0.3–1.2)
Total Protein: 6.4 g/dL — ABNORMAL LOW (ref 6.5–8.1)

## 2021-10-16 LAB — SAMPLE TO BLOOD BANK

## 2021-10-16 LAB — PREPARE RBC (CROSSMATCH)

## 2021-10-16 MED ORDER — SODIUM CHLORIDE 0.9% IV SOLUTION
250.0000 mL | Freq: Once | INTRAVENOUS | Status: AC
  Administered 2021-10-16: 14:00:00 250 mL via INTRAVENOUS

## 2021-10-16 MED ORDER — METHYLPREDNISOLONE SODIUM SUCC 40 MG IJ SOLR
40.0000 mg | Freq: Once | INTRAMUSCULAR | Status: DC
  Filled 2021-10-16: qty 1

## 2021-10-16 MED ORDER — ACETAMINOPHEN 325 MG PO TABS
650.0000 mg | ORAL_TABLET | Freq: Once | ORAL | Status: DC
  Filled 2021-10-16: qty 2

## 2021-10-16 NOTE — Progress Notes (Signed)
While saline was infusing, IV pump was beeping "occluded on pt side". RN assessed site and found it to be wet. IV fluids paused and dressing removed. Upon removing dressing, a 0.5 cm skin tear was found right at the IV site insertion. No blood return was noted, IV needle was removed and Tegaderm was placed on IV site after being cleansed with normal saline. IV site was slightly swollen and cool to the touch, patient denied pain.   Due to antibodies present in patient's blood, blood was not able to be given during patient's visit today. Patient is to come to infusion room tomorrow morning for 2 units PRBCs. Patient's sister was made aware and verbalized understanding that patient has to have her blue armband for blood to be administered.

## 2021-10-16 NOTE — Telephone Encounter (Signed)
CRITICAL VALUE STICKER  CRITICAL VALUE: Hgb = 5.0 and Platelets = 1,167  RECEIVER (on-site recipient of call): Yetta Glassman, CMA  DATE & TIME NOTIFIED: 10/16/21 at 10:33am  MESSENGER (representative from lab): Lauren  MD NOTIFIED: Irene Limbo  TIME OF NOTIFICATION: 10/16/21 at 10:38am  RESPONSE: Notification given to Ilda Foil., RN for follow-up with pt and provider.

## 2021-10-17 ENCOUNTER — Inpatient Hospital Stay: Payer: Medicare Other

## 2021-10-17 ENCOUNTER — Other Ambulatory Visit: Payer: Self-pay

## 2021-10-17 DIAGNOSIS — D5912 Cold autoimmune hemolytic anemia: Secondary | ICD-10-CM

## 2021-10-17 LAB — PREPARE RBC (CROSSMATCH)

## 2021-10-17 MED ORDER — SODIUM CHLORIDE 0.9% IV SOLUTION
250.0000 mL | Freq: Once | INTRAVENOUS | Status: AC
  Administered 2021-10-17: 10:00:00 250 mL via INTRAVENOUS

## 2021-10-17 MED ORDER — METHYLPREDNISOLONE SODIUM SUCC 40 MG IJ SOLR
40.0000 mg | Freq: Once | INTRAMUSCULAR | Status: AC
  Administered 2021-10-17: 10:00:00 40 mg via INTRAVENOUS
  Filled 2021-10-17: qty 1

## 2021-10-17 MED ORDER — ACETAMINOPHEN 325 MG PO TABS
650.0000 mg | ORAL_TABLET | Freq: Once | ORAL | Status: AC
  Administered 2021-10-17: 10:00:00 650 mg via ORAL
  Filled 2021-10-17: qty 2

## 2021-10-17 NOTE — Patient Instructions (Signed)
Blood Transfusion, Adult °A blood transfusion is a procedure in which you receive blood through an IV tube. You may need this procedure because of: °A bleeding disorder. °An illness. °An injury. °A surgery. °The blood may come from someone else (a donor). You may also be able to donate blood for yourself. The blood given in a transfusion is made up of different types of cells. You may get: °Red blood cells. These carry oxygen to the cells in the body. °White blood cells. These help you fight infections. °Platelets. These help your blood to clot. °Plasma. This is the liquid part of your blood. It carries proteins and other substances through the body. °If you have a clotting disorder, you may also get other types of blood products. °Tell your doctor about: °Any blood disorders you have. °Any reactions you have had during a blood transfusion in the past. °Any allergies you have. °All medicines you are taking, including vitamins, herbs, eye drops, creams, and over-the-counter medicines. °Any surgeries you have had. °Any medical conditions you have. This includes any recent fever or cold symptoms. °Whether you are pregnant or may be pregnant. °What are the risks? °Generally, this is a safe procedure. However, problems may occur. °The most common problems include: °A mild allergic reaction. This includes red, swollen areas of skin (hives) and itching. °Fever or chills. This may be the body's response to new blood cells received. This may happen during or up to 4 hours after the transfusion. °More serious problems may include: °Too much fluid in the lungs. This may cause breathing problems. °A serious allergic reaction. This includes breathing trouble or swelling around the face and lips. °Lung injury. This causes breathing trouble and low oxygen in the blood. This can happen within hours of the transfusion or days later. °Too much iron. This can happen after getting many blood transfusions over a period of time. °An  infection or virus passed through the blood. This is rare. Donated blood is carefully tested before it is given. °Your body's defense system (immune system) trying to attack the new blood cells. This is rare. Symptoms may include fever, chills, nausea, low blood pressure, and low back or chest pain. °Donated cells attacking healthy tissues. This is rare. °What happens before the procedure? °Medicines °Ask your doctor about: °Changing or stopping your normal medicines. This is important. °Taking aspirin and ibuprofen. Do not take these medicines unless your doctor tells you to take them. °Taking over-the-counter medicines, vitamins, herbs, and supplements. °General instructions °Follow instructions from your doctor about what you cannot eat or drink. °You will have a blood test to find out your blood type. The test also finds out what type of blood your body will accept and matches it to the donor type. °If you are going to have a planned surgery, you may be able to donate your own blood. This may be done in case you need a transfusion. °You will have your temperature, blood pressure, and pulse checked. °You may receive medicine to help prevent an allergic reaction. This may be done if you have had a reaction to a transfusion before. This medicine may be given to you by mouth or through an IV tube. °This procedure lasts about 1-4 hours. Plan for the time you need. °What happens during the procedure? ° °An IV tube will be put into one of your veins. °The bag of donated blood will be attached to your IV tube. Then, the blood will enter through your vein. °Your temperature,   blood pressure, and pulse will be checked often. This is done to find early signs of a transfusion reaction. °Tell your nurse right away if you have any of these symptoms: °Shortness of breath or trouble breathing. °Chest or back pain. °Fever or chills. °Red, swollen areas of skin or itching. °If you have any signs or symptoms of a reaction, your  transfusion will be stopped. You may also be given medicine. °When the transfusion is finished, your IV tube will be taken out. °Pressure may be put on the IV site for a few minutes. °A bandage (dressing) will be put on the IV site. °The procedure may vary among doctors and hospitals. °What happens after the procedure? °You will be monitored until you leave the hospital or clinic. This includes checking your temperature, blood pressure, pulse, breathing rate, and blood oxygen level. °Your blood may be tested to see how you are responding to the transfusion. °You may be warmed with fluids or blankets. This is done to keep the temperature of your body normal. °If you have your procedure in an outpatient setting, you will be told whom to contact to report any reactions. °Where to find more information °To learn more, visit the [employer]: redcross.org °Summary °A blood transfusion is a procedure in which you are given blood through an IV tube. °The blood may come from someone else (a donor). You may also be able to donate blood for yourself. °The blood you are given is made up of different blood cells. You may receive red blood cells, platelets, plasma, or white blood cells. °Your temperature, blood pressure, and pulse will be checked often. °After the procedure, your blood may be tested to see how you are responding. °This information is not intended to replace advice given to you by your health care provider. Make sure you discuss any questions you have with your health care provider. °Document Revised: 03/04/2019 Document Reviewed: 03/04/2019 °Elsevier Patient Education © 2022 [employer] ° °

## 2021-10-18 LAB — TYPE AND SCREEN
ABO/RH(D): B POS
Antibody Screen: POSITIVE
Unit division: 0
Unit division: 0

## 2021-10-18 LAB — BPAM RBC
Blood Product Expiration Date: 202302212359
Blood Product Expiration Date: 202302212359
ISSUE DATE / TIME: 202301251012
ISSUE DATE / TIME: 202301251012
Unit Type and Rh: 5100
Unit Type and Rh: 5100

## 2021-10-22 ENCOUNTER — Encounter: Payer: Self-pay | Admitting: Hematology

## 2021-10-22 NOTE — Progress Notes (Addendum)
.. 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  She is accompanied by her daughter-in-law. She is noted to have significant symptomatic anemia with a hemoglobin of 5 today in the context of ongoing hemolysis and blood loss with surgery. I discussed the options of sending her to the emergency room for urgent PRBC transfusion or trying to do scheduled PRBC transfusion as outpatient with monitoring. Patient declines option of going to the emergency room for further evaluation and management. We discussed completing her 2 remaining Rituxan infusions and the patient is agreeable with this. We again discussed importance of cold avoidance. No other overt bleeding noted. Labs reviewed today. Discussed goals of care in detail.  REVIEW OF SYSTEMS:   .10 Point review of Systems was done is negative except as noted above.    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 Past Surgical History:  Procedure Laterality Date   ABDOMINAL  HYSTERECTOMY  1986   APPENDECTOMY  1967   BREAST EXCISIONAL BIOPSY Right 1984   CATARACT EXTRACTION W/ INTRAOCULAR LENS IMPLANT Right 1990's   congential cataract   CYSTOSCOPY W/ URETERAL STENT PLACEMENT  11/12/2015   Procedure: CYSTOSCOPY WITH RETROGRADE PYELOGRAM/URETERAL STENT PLACEMENT;  Surgeon: [name], MD;  Location: [facility];  Service: Urology;;   CYSTOSCOPY W/ URETERAL STENT REMOVAL Left 11/22/2015   Procedure: CYSTOSCOPY WITH STENT REMOVAL;  Surgeon: [name], MD;  Location: [facility];  Service: Urology;  Laterality: Left;   CYSTOSCOPY/URETEROSCOPY/HOLMIUM LASER Left 11/22/2015   Procedure: CYSTOSCOPY/URETEROSCOPY/HOLMIUM LASER;  Surgeon: [name], MD;  Location: [facility];  Service: Urology;  Laterality: Left;  FLEX URETEROSCOPY   FEMUR IM NAIL Left 08/04/2015   Procedure: INTRAMEDULLARY (IM) NAIL FEMORAL;  Surgeon: [name], MD;  Location: [place];  Service: Orthopedics;  Laterality: Left;   INTRAMEDULLARY (IM) NAIL INTERTROCHANTERIC Right 10/04/2021   Procedure: INTRAMEDULLARY (IM) NAIL INTERTROCHANTRIC;  Surgeon: [name], MD;  Location: [place];  Service: Orthopedics;  Laterality: Right;   IR KYPHO THORACIC WITH BONE BIOPSY  06/22/2018   TONSILLECTOMY  age 3    . Social History   Tobacco Use   Smoking status: Former    Packs/day: 1.00    Types: Cigarettes    Quit date: 08/03/2015    Years since quitting: 6.2   Smokeless tobacco: Never  Vaping Use   Vaping Use: Never used  Substance Use Topics   Alcohol use: Yes    Comment: occ   Drug use: Never  ALLERGIES:  is allergic to other and shellfish allergy.  MEDICATIONS:  Current Outpatient Medications  Medication Sig Dispense Refill   acetaminophen (TYLENOL) 500 MG tablet Take 500 mg by mouth every 6 (six) hours as needed for moderate pain.     aspirin EC 325 MG tablet Take 1 tablet (325 mg total) by mouth daily. X 5 weeks for DVT prophylaxis 30 tablet 0   docusate  sodium (COLACE) 100 MG capsule Take 1 capsule (100 mg total) by mouth 2 (two) times daily. 10 capsule 0   folic acid (FOLVITE) 1 MG tablet Take 2 tablets (2 mg total) by mouth daily.     loratadine (CLARITIN) 10 MG tablet Take 10 mg by mouth daily as needed for allergies.     melatonin 3 MG TABS tablet Take 1 tablet (3 mg total) by mouth at bedtime as needed (insomnia).  0   Multiple Vitamin (MULTIVITAMIN WITH MINERALS) TABS tablet Take 1 tablet by mouth daily.     pantoprazole (PROTONIX) 40 MG tablet Take 1 tablet (40 mg total) by mouth daily at 6 (six) AM.     QUEtiapine (SEROQUEL) 25 MG tablet Take 1 tablet (25 mg total) by mouth at bedtime. 20 tablet 0   traMADol (ULTRAM) 50 MG tablet Take 1 tablet (50 mg total) by mouth every 6 (six) hours as needed for moderate pain or severe pain. 20 tablet 0   vitamin B-12 2000 MCG tablet Take 1 tablet (2,000 mcg total) by mouth daily. 30 tablet 2   No current facility-administered medications for this visit.    PHYSICAL EXAMINATION: ECOG PERFORMANCE STATUS: 2 .BP (!) 101/37    Pulse 80    Temp (!) 97.5 F (36.4 C)    Resp 18    SpO2 100%   GENERAL:alert, in no acute distress and comfortable SKIN: no acute rashes, no significant lesions EYES: conjunctiva are pink and non-injected, sclera anicteric OROPHARYNX: MMM, no exudates, no oropharyngeal erythema or ulceration NECK: supple, no JVD LYMPH:  no palpable lymphadenopathy in the cervical, axillary or inguinal regions LUNGS: clear to auscultation b/l with normal respiratory effort HEART: regular rate & rhythm ABDOMEN:  normoactive bowel sounds , non tender, not distended. Extremity: no pedal edema PSYCH: alert & oriented x 3 with fluent speech NEURO: no focal motor/sensory deficits     LABORATORY DATA:  . CBC Latest Ref Rng & Units 10/16/2021 10/09/2021 10/08/2021  WBC 4.0 - 10.5 K/uL 22.1(H) 13.7(H) 17.8(H)  Hemoglobin 12.0 - 15.0 g/dL 5.0(LL) 7.6(L) 7.0(L)  Hematocrit 36.0 - 46.0 %  14.6(L) 21.8(L) 21.3(L)  Platelets 150 - 400 K/uL 1,167(HH) 560(H) 562(H)   . CMP Latest Ref Rng & Units 10/16/2021 10/07/2021 10/05/2021  Glucose 70 - 99 mg/dL 106(H) 110(H) 116(H)  BUN 8 - 23 mg/dL 16 15 20   Creatinine 0.44 - 1.00 mg/dL 0.81 0.56 0.71  Sodium 135 - 145 mmol/L 139 135 135  Potassium 3.5 - 5.1 mmol/L 3.3(L) 4.3 4.9  Chloride 98 - 111 mmol/L 97(L) 99 98  CO2 22 - 32 mmol/L 32 31 27  Calcium 8.9 - 10.3 mg/dL 8.3(L) 7.8(L) 7.7(L)  Total Protein 6.5 - 8.1 g/dL 6.4(L) - -  Total Bilirubin 0.3 - 1.2 mg/dL 2.5(H) - -  Alkaline Phos 38 - 126 U/L 227(H) - -  AST 15 - 41 U/L 40 - -  ALT 0 - 44 U/L 21 - -             RADIOGRAPHIC STUDIES: I have personally reviewed  the radiological images as listed and agreed with the findings in the report. DG Ribs Unilateral W/Chest Left  Result Date: 10/02/2021 CLINICAL DATA:  Fall, chest pain. EXAM: LEFT RIBS AND CHEST - 3+ VIEW COMPARISON:  Chest x-ray 08/10/2021. FINDINGS: Cardiomediastinal silhouette is stable, the heart is mildly enlarged. There are minimal patchy opacities in the left costophrenic angle. There may be a small left pleural effusion. There is no evidence for pneumothorax. There are atherosclerotic calcifications of the aorta. There is an acute left lateral ninth rib fracture. IMPRESSION: 1. Acute fracture left ninth rib. 2. Minimal left basilar atelectasis/airspace disease with questionable small left pleural effusion. Electronically Signed   By: Ronney Asters M.D.   On: 10/02/2021 20:26   CT HEAD WO CONTRAST (5MM)  Result Date: 10/03/2021 CLINICAL DATA:  Initial evaluation for acute head trauma. EXAM: CT HEAD WITHOUT CONTRAST TECHNIQUE: Contiguous axial images were obtained from the base of the skull through the vertex without intravenous contrast. RADIATION DOSE REDUCTION: This exam was performed according to the departmental dose-optimization program which includes automated exposure control, adjustment of the mA  and/or kV according to patient size and/or use of iterative reconstruction technique. COMPARISON:  None available. FINDINGS: Brain: Moderately advanced age-related cerebral atrophy with chronic small vessel ischemic disease. Small volume acute intraventricular hemorrhage centered about the foramen of Monro measures up to 12 mm (series 4, image 15). Additional possible trace hemorrhage within the left temporal horn (series 4, image 11). Mild ventricular prominence related to global parenchymal volume loss without hydrocephalus or trapping. No other acute intracranial hemorrhage. No acute large vessel territory infarct. No mass lesion or midline shift. No extra-axial fluid collection. Vascular: No hyperdense vessel. Scattered vascular calcifications noted within the carotid siphons. Skull: Scalp soft tissues within normal limits.  Calvarium intact. Sinuses/Orbits: Globes and orbital soft tissues demonstrate no acute finding. Paranasal sinuses are clear. No significant mastoid effusion. Other: None. IMPRESSION: 1. Small volume acute intraventricular hemorrhage measuring up to 12 mm, primarily centered about the foramen of Monro. No hydrocephalus or trapping. 2. Moderately advanced age-related cerebral atrophy with chronic small vessel ischemic disease. Critical Value/emergent results were called by telephone at the time of interpretation on 10/03/2021 at 1:40 am to provider [facility] , who verbally acknowledged these results. Electronically Signed   By: Jeannine Boga M.D.   On: 10/03/2021 01:42   DG CHEST PORT 1 VIEW  Result Date: 10/03/2021 CLINICAL DATA:  Shortness of breath, hip fracture, COPD EXAM: PORTABLE CHEST 1 VIEW COMPARISON:  Chest radiograph dated October 02, 2021 FINDINGS: The heart is enlarged. Atherosclerotic calcification of aortic arch. Elevation of the left hemidiaphragm with left basilar atelectasis and/or small left pleural effusion. The right lung is clear. Vertebroplasty of the  lower thoracic vertebral body. Previously noted left ninth rib fracture is again noted. IMPRESSION: 1. Stable cardiomegaly. 2. Elevation of the left hemidiaphragm with left basilar atelectasis and/or small pleural effusion. Electronically Signed   By: Keane Police D.O.   On: 10/03/2021 08:37   DG Knee Complete 4 Views Right  Result Date: 10/02/2021 CLINICAL DATA:  Fall. EXAM: RIGHT KNEE - COMPLETE 4+ VIEW COMPARISON:  None. FINDINGS: There is no acute fracture or dislocation of the right knee. The bones are osteopenic. There is moderate arthritic changes. No significant joint effusion. There is diffuse subcutaneous edema. IMPRESSION: No acute fracture or dislocation of the right knee. Electronically Signed   By: Anner Crete M.D.   On: 10/02/2021 20:27   DG C-Arm 1-60  Min-No Report  Result Date: 10/04/2021 Fluoroscopy was utilized by the requesting physician.  No radiographic interpretation.   DG C-Arm 1-60 Min-No Report  Result Date: 10/04/2021 Fluoroscopy was utilized by the requesting physician.  No radiographic interpretation.   DG C-Arm 1-60 Min-No Report  Result Date: 10/04/2021 Fluoroscopy was utilized by the requesting physician.  No radiographic interpretation.   DG Hip Unilat W or Wo Pelvis 2-3 Views Right  Result Date: 10/02/2021 CLINICAL DATA:  Fall. EXAM: DG HIP (WITH OR WITHOUT PELVIS) 2-3V RIGHT COMPARISON:  None. FINDINGS: The bones are osteopenic. There is an acute comminuted right femoral intratrochanteric fracture with superolateral dislocation of the distal fracture fragment. There is no evidence for dislocation. Left-sided hip screw is partially visualized and appears uncomplicated. IMPRESSION: 1. Displaced comminuted right femoral intratrochanteric fracture. Electronically Signed   By: Ronney Asters M.D.   On: 10/02/2021 20:27   DG FEMUR, MIN 2 VIEWS RIGHT  Result Date: 10/05/2021 CLINICAL DATA:  Postop EXAM: RIGHT FEMUR 2 VIEWS COMPARISON:  10/02/2021 FINDINGS:  Internal fixation across the proximal right femoral intertrochanteric fracture. No hardware complicating feature. Mild residual displacement across the fracture fragments. No subluxation or dislocation. IMPRESSION: Internal fixation with mild continued displacement. No hardware complicating feature. Electronically Signed   By: Rolm Baptise M.D.   On: 10/05/2021 00:24   DG FEMUR, MIN 2 VIEWS RIGHT  Result Date: 10/04/2021 CLINICAL DATA:  Intramedullary nail. EXAM: RIGHT FEMUR 2 VIEWS COMPARISON:  Preoperative radiographs 10/02/2021 FINDINGS: Six fluoroscopic spot views of the right femur obtained in the operating room in frontal and lateral projections. Lung intramedullary nail with distal locking and trans trochanteric screw fixation of intertrochanteric femur fracture. Improved fracture alignment from preoperative imaging. Fluoroscopy time 4 minutes 41 seconds. Dose 14.13 mGy. IMPRESSION: Intraoperative fluoroscopy during right femur fracture ORIF. Electronically Signed   By: Keith Rake M.D.   On: 10/04/2021 16:47    ASSESSMENT & PLAN:   #1: Cold Agglutinin related chronic hemolytic anemia. Her hemoglobin tends to be lower during winter and has been in the 6-8 range. Overall stable hemoglobin levels in the 6-7 range with folic acid replacement and cold avoidance.  #2 IgM lambda MGUS - cannot rule out possibility of lymphoplasmacytic lymphoma though less likely given chronicity of her condition. Flow cytometry- no clonal B cell lymphoma   #3 History of previous B12 deficiency  #4 Low back pain -compression fracture L4 -MRI L spine -Acute/subacute superior endplate fracture at L4 with loss of height of 10-20%. No retropulsed bone. This is quite likely the cause of the low back pain. This was probably present on the radiographs of 01/20/2018, but not diagnosable because of the spinal curvature and unlevel endplates. Old healed superior endplate fracture at L89  #5 Spiculated Lung nodule in  right upper lobe  First seen on 05/28/18 CTA Chest Hasn't changed over 6 month interval with repeat CT Chest in February 2020  10/26/18 CT Chest which revealed There is a spiculated solid nodule of the right upper lobe, measuring 5-6 mm and stable from prior examination. Recommend additional follow-up in 12 months to ensure ongoing stability. 2.  Emphysema. 3. Increased height loss deformities of T11 and T12.  08/13/2019 CT Chest ([id number]) revealed "4 mm posterior right upper lobe pulmonary nodule, unchanged. Dedicated follow-up imaging is not required per Fleischner Society guidelines. This recommendation follows the consensus statement Guidelines for Management of Small Pulmonary Nodules Detected on CT Images: From the Fleischner Society 2017; Radiology 2017; 284:228-243. Aortic Atherosclerosis (ICD10-I70.0)  and Emphysema (ICD10-J43.9)."  #7 Vitamin D deficiency -continue Vit D  #8 Patient Active Problem List   Diagnosis Date Noted   Left 9th rib fracture 10/05/2021   Dementia ([place]) 10/03/2021   Intraventricular hemorrhage ([place]) 10/03/2021   Hip fracture ([place]) 10/02/2021   Sepsis due to pneumonia ([place]) 08/10/2021   Sepsis due to undetermined organism ([place]) 08/09/2021   Aortic atherosclerosis ([place]) 08/09/2021   COPD (chronic obstructive pulmonary disease) ([facility])    Elevated transaminase level    Symptomatic anemia 05/28/2018   Autoimmune hemolytic anemia ([place]) 02/25/2018   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 ([place]) 02/04/2018   Occult blood in stools 02/04/2018   Ureteral calculus 11/12/2015   Breast cancer screening, high risk patient 08/29/2015   Acute delirium 08/07/2015   Postoperative anemia due to acute blood loss 08/06/2015   Protein-calorie malnutrition, severe 08/04/2015   Closed intertrochanteric fracture of left femur ([place]) 08/03/2015   Fall 08/03/2015   Closed right hip fracture ([place]) 08/03/2015   Hyperbilirubinemia 08/01/2013   Hypokalemia 07/30/2013   Vitamin B 12  deficiency 08/07/2012   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   PLAN: -Patient noted to have symptomatic anemia today with a hemoglobin of 5 in the context of her cold agglutinin disease related hemolysis and blood loss from recent hip surgery. -Patient declined option to be transferred to the emergency room for severe anemia. -We will set her up as soon as possible for 2 units of PRBCs through a blood warmer. -Discussed with patient and she is agreeable to be scheduled for her remaining 2 doses of weekly IV Rituxan for her cold agglutinin hemolytic anemia. -If her hemolysis remains uncontrolled despite Rituxan might need to consider the use of complement directed therapies (Sutimlimab)-depending on her goals of care. -Discussed goals of care in detail.  Patient does want to continue to pursue directed therapies for her cold agglutinin disease. -Dementia significant and we discussed that once she is discharged from her skilled nursing facility she would have an option to go to hospice or go back home with additional support.  She will need 24/7 cares at home and we discussed that she will likely need paid help in addition to home care services.  Patient notes that she does have the resources to hire private help if needed.  Family is supportive.  FOLLOW UP:  Please schedule for labs and 2 units of PRBC transfusions weekly x4 Please schedule for weekly Rituxan x2 doses with labs  MD visit with Rituxan treatments    All of the patient's questions were answered with apparent satisfaction. The patient knows to call the clinic with any problems, questions or concerns.  The total time spent in the appointment was 32 minutes during medical records, reviewing  labs with the patient, discussion of goals of care, reordering Rituxan infusions, ordering and management of blood transfusion support and documentation.  Sullivan Lone MD Elaine AAHIVMS [facility] Hematology/Oncology Physician [facility].Marland Kitchen

## 2021-10-23 ENCOUNTER — Other Ambulatory Visit: Payer: Self-pay

## 2021-10-23 ENCOUNTER — Inpatient Hospital Stay: Payer: Medicare Other

## 2021-10-23 DIAGNOSIS — D5912 Cold autoimmune hemolytic anemia: Secondary | ICD-10-CM | POA: Diagnosis not present

## 2021-10-23 LAB — SAMPLE TO BLOOD BANK

## 2021-10-23 LAB — CBC WITH DIFFERENTIAL (CANCER CENTER ONLY)
Abs Immature Granulocytes: 0.06 10*3/uL (ref 0.00–0.07)
Basophils Absolute: 0.1 10*3/uL (ref 0.0–0.1)
Basophils Relative: 1 %
Eosinophils Absolute: 0.1 10*3/uL (ref 0.0–0.5)
Eosinophils Relative: 1 %
HCT: 19 % — ABNORMAL LOW (ref 36.0–46.0)
Hemoglobin: 6.3 g/dL — CL (ref 12.0–15.0)
Immature Granulocytes: 1 %
Lymphocytes Relative: 15 %
Lymphs Abs: 1.9 10*3/uL (ref 0.7–4.0)
MCH: 30.6 pg (ref 26.0–34.0)
MCHC: 33.2 g/dL (ref 30.0–36.0)
MCV: 92.2 fL (ref 80.0–100.0)
Monocytes Absolute: 0.9 10*3/uL (ref 0.1–1.0)
Monocytes Relative: 7 %
Neutro Abs: 9.9 10*3/uL — ABNORMAL HIGH (ref 1.7–7.7)
Neutrophils Relative %: 75 %
Platelet Count: 482 10*3/uL — ABNORMAL HIGH (ref 150–400)
RBC: 2.06 MIL/uL — ABNORMAL LOW (ref 3.87–5.11)
RDW: 21.8 % — ABNORMAL HIGH (ref 11.5–15.5)
WBC Count: 12.9 10*3/uL — ABNORMAL HIGH (ref 4.0–10.5)
nRBC: 0 % (ref 0.0–0.2)

## 2021-10-23 LAB — CMP (CANCER CENTER ONLY)
ALT: 17 U/L (ref 0–44)
AST: 37 U/L (ref 15–41)
Albumin: 3.7 g/dL (ref 3.5–5.0)
Alkaline Phosphatase: 207 U/L — ABNORMAL HIGH (ref 38–126)
Anion gap: 7 (ref 5–15)
BUN: 25 mg/dL — ABNORMAL HIGH (ref 8–23)
CO2: 31 mmol/L (ref 22–32)
Calcium: 8.6 mg/dL — ABNORMAL LOW (ref 8.9–10.3)
Chloride: 100 mmol/L (ref 98–111)
Creatinine: 0.52 mg/dL (ref 0.44–1.00)
GFR, Estimated: >60 mL/min (ref >60)
Glucose, Bld: 92 mg/dL (ref 70–99)
Potassium: 3.5 mmol/L (ref 3.5–5.1)
Sodium: 138 mmol/L (ref 135–145)
Total Bilirubin: 3.1 mg/dL — ABNORMAL HIGH (ref 0.3–1.2)
Total Protein: 6.4 g/dL — ABNORMAL LOW (ref 6.5–8.1)

## 2021-10-23 LAB — PREPARE RBC (CROSSMATCH)

## 2021-10-23 LAB — LACTATE DEHYDROGENASE: LDH: 705 U/L — ABNORMAL HIGH (ref 98–192)

## 2021-10-24 ENCOUNTER — Other Ambulatory Visit: Payer: Medicare Other

## 2021-10-24 ENCOUNTER — Inpatient Hospital Stay: Payer: Medicare Other

## 2021-10-24 ENCOUNTER — Inpatient Hospital Stay: Payer: Medicare Other | Attending: Hematology

## 2021-10-24 DIAGNOSIS — D5912 Cold autoimmune hemolytic anemia: Secondary | ICD-10-CM | POA: Diagnosis present

## 2021-10-24 DIAGNOSIS — E559 Vitamin D deficiency, unspecified: Secondary | ICD-10-CM | POA: Insufficient documentation

## 2021-10-24 DIAGNOSIS — Z79899 Other long term (current) drug therapy: Secondary | ICD-10-CM | POA: Diagnosis not present

## 2021-10-24 DIAGNOSIS — I700 Atherosclerosis of aorta: Secondary | ICD-10-CM | POA: Diagnosis not present

## 2021-10-24 DIAGNOSIS — Z5111 Encounter for antineoplastic chemotherapy: Secondary | ICD-10-CM | POA: Insufficient documentation

## 2021-10-24 DIAGNOSIS — D472 Monoclonal gammopathy: Secondary | ICD-10-CM | POA: Diagnosis not present

## 2021-10-24 DIAGNOSIS — Z86010 Personal history of colonic polyps: Secondary | ICD-10-CM | POA: Diagnosis not present

## 2021-10-24 DIAGNOSIS — E538 Deficiency of other specified B group vitamins: Secondary | ICD-10-CM | POA: Insufficient documentation

## 2021-10-24 DIAGNOSIS — Z87891 Personal history of nicotine dependence: Secondary | ICD-10-CM | POA: Diagnosis not present

## 2021-10-24 DIAGNOSIS — J449 Chronic obstructive pulmonary disease, unspecified: Secondary | ICD-10-CM | POA: Insufficient documentation

## 2021-10-24 DIAGNOSIS — I6523 Occlusion and stenosis of bilateral carotid arteries: Secondary | ICD-10-CM | POA: Diagnosis not present

## 2021-10-24 LAB — COLD AGGLUTININ TITER: Cold Agglutinin Titer: >1:4096 {titer}

## 2021-10-24 MED ORDER — ACETAMINOPHEN 325 MG PO TABS
650.0000 mg | ORAL_TABLET | Freq: Once | ORAL | Status: AC
  Administered 2021-10-24: 650 mg via ORAL
  Filled 2021-10-24: qty 2

## 2021-10-24 MED ORDER — SODIUM CHLORIDE 0.9% IV SOLUTION: 250.0000 mL | Freq: Once | INTRAVENOUS | Status: DC

## 2021-10-24 MED ORDER — METHYLPREDNISOLONE SODIUM SUCC 40 MG IJ SOLR
40.0000 mg | Freq: Once | INTRAMUSCULAR | Status: AC
  Administered 2021-10-24: 40 mg via INTRAVENOUS
  Filled 2021-10-24: qty 1

## 2021-10-24 NOTE — Patient Instructions (Signed)
Blood Transfusion, Adult, Care After This sheet gives you information about how to care for yourself after your procedure. Your doctor may also give you more specific instructions. If you have problems or questions, contact your doctor. What can I expect after the procedure? After the procedure, it is common to have: Bruising and soreness at the IV site. A headache. Follow these instructions at home: Insertion site care   Follow instructions from your doctor about how to take care of your insertion site. This is where an IV tube was put into your vein. Make sure you: Wash your hands with soap and water before and after you change your bandage (dressing). If you cannot use soap and water, use hand sanitizer. Change your bandage as told by your doctor. Check your insertion site every day for signs of infection. Check for: Redness, swelling, or pain. Bleeding from the site. Warmth. Pus or a bad smell. General instructions Take over-the-counter and prescription medicines only as told by your doctor. Rest as told by your doctor. Go back to your normal activities as told by your doctor. Keep all follow-up visits as told by your doctor. This is important. Contact a doctor if: You have itching or red, swollen areas of skin (hives). You feel worried or nervous (anxious). You feel weak after doing your normal activities. You have redness, swelling, warmth, or pain around the insertion site. You have blood coming from the insertion site, and the blood does not stop with pressure. You have pus or a bad smell coming from the insertion site. Get help right away if: You have signs of a serious reaction. This may be coming from an allergy or the body's defense system (immune system). Signs include: Trouble breathing or shortness of breath. Swelling of the face or feeling warm (flushed). Fever or chills. Head, chest, or back pain. Dark pee (urine) or blood in the pee. Widespread rash. Fast  heartbeat. Feeling dizzy or light-headed. You may receive your blood transfusion in an outpatient setting. If so, you will be told whom to contact to report any reactions. These symptoms may be an emergency. Do not wait to see if the symptoms will go away. Get medical help right away. Call your local emergency services (911 in the [place] Do not drive yourself to the hospital. Summary Bruising and soreness at the IV site are common. Check your insertion site every day for signs of infection. Rest as told by your doctor. Go back to your normal activities as told by your doctor. Get help right away if you have signs of a serious reaction. This information is not intended to replace advice given to you by your health care provider. Make sure you discuss any questions you have with your health care provider. Document Revised: 01/04/2021 Document Reviewed: 03/04/2019 Elsevier Patient Education  2022 [employer]  

## 2021-10-24 NOTE — Progress Notes (Signed)
During IV administration patient obtained a skin tear to her right forearm. Cleaned and dressed with Vaseline gauze and tegaderm. Dr. Irene Limbo made aware. Patient c/o lower leg discomfort. Right lower leg mild edema, greater than left, warm and red. Dr. Irene Limbo RN [name] made aware and communicated to Dr. Irene Limbo. Per [name] RN patient will get doppler study on 2/2 when she returns for treatment.

## 2021-10-25 ENCOUNTER — Inpatient Hospital Stay: Payer: Medicare Other

## 2021-10-25 ENCOUNTER — Inpatient Hospital Stay (HOSPITAL_BASED_OUTPATIENT_CLINIC_OR_DEPARTMENT_OTHER): Payer: Medicare Other | Admitting: Hematology

## 2021-10-25 ENCOUNTER — Other Ambulatory Visit: Payer: Self-pay

## 2021-10-25 VITALS — BP 99/33 | HR 67 | Temp 98.2°F | Resp 17

## 2021-10-25 DIAGNOSIS — D5912 Cold autoimmune hemolytic anemia: Secondary | ICD-10-CM

## 2021-10-25 DIAGNOSIS — M7989 Other specified soft tissue disorders: Secondary | ICD-10-CM

## 2021-10-25 LAB — TYPE AND SCREEN
ABO/RH(D): B POS
Antibody Screen: NEGATIVE
DAT, IgG: NEGATIVE
Unit division: 0
Unit division: 0

## 2021-10-25 LAB — BPAM RBC
Blood Product Expiration Date: 202303062359
Blood Product Expiration Date: 202303062359
ISSUE DATE / TIME: 202302010832
ISSUE DATE / TIME: 202302010832
Unit Type and Rh: 5100
Unit Type and Rh: 5100

## 2021-10-25 MED ORDER — LORATADINE 10 MG PO TABS
10.0000 mg | ORAL_TABLET | Freq: Every day | ORAL | Status: DC
  Administered 2021-10-25: 10 mg via ORAL
  Filled 2021-10-25: qty 1

## 2021-10-25 MED ORDER — SACCHAROMYCES BOULARDII 250 MG PO CAPS: 250.0000 mg | ORAL_CAPSULE | Freq: Two times a day (BID) | ORAL | 0 refills | Status: DC

## 2021-10-25 MED ORDER — METHYLPREDNISOLONE SODIUM SUCC 125 MG IJ SOLR
80.0000 mg | Freq: Every day | INTRAMUSCULAR | Status: DC
  Administered 2021-10-25: 80 mg via INTRAVENOUS
  Filled 2021-10-25: qty 2

## 2021-10-25 MED ORDER — SODIUM CHLORIDE 0.9 % IV SOLN
375.0000 mg/m2 | Freq: Once | INTRAVENOUS | Status: AC
  Administered 2021-10-25: 500 mg via INTRAVENOUS
  Filled 2021-10-25: qty 50

## 2021-10-25 MED ORDER — SODIUM CHLORIDE 0.9 % IV SOLN: Freq: Once | INTRAVENOUS | Status: AC

## 2021-10-25 MED ORDER — AMOXICILLIN-POT CLAVULANATE 875-125 MG PO TABS: 1.0000 | ORAL_TABLET | Freq: Two times a day (BID) | ORAL | 0 refills | Status: DC

## 2021-10-25 MED ORDER — FAMOTIDINE IN NACL 20-0.9 MG/50ML-% IV SOLN
20.0000 mg | Freq: Once | INTRAVENOUS | Status: AC
  Administered 2021-10-25: 20 mg via INTRAVENOUS
  Filled 2021-10-25: qty 50

## 2021-10-25 MED ORDER — ACETAMINOPHEN 325 MG PO TABS
650.0000 mg | ORAL_TABLET | Freq: Once | ORAL | Status: AC
  Administered 2021-10-25: 650 mg via ORAL
  Filled 2021-10-25: qty 2

## 2021-10-25 MED ORDER — DOXYCYCLINE HYCLATE 100 MG PO TABS: 100.0000 mg | ORAL_TABLET | Freq: Two times a day (BID) | ORAL | 0 refills | Status: AC

## 2021-10-25 NOTE — Patient Instructions (Signed)
[facility] MEDICAL ONCOLOGY  Discharge Instructions: Thank you for choosing [facility] to provide your oncology and hematology care.   If you have a lab appointment with the Cancer Center, please go directly to the Cancer Center and check in at the registration area.   Wear comfortable clothing and clothing appropriate for easy access to any Portacath or PICC line.   We strive to give you quality time with your provider. You may need to reschedule your appointment if you arrive late (15 or more minutes).  Arriving late affects you and other patients whose appointments are after yours.  Also, if you miss three or more appointments without notifying the office, you may be dismissed from the clinic at the provider's discretion.      For prescription refill requests, have your pharmacy contact our office and allow 72 hours for refills to be completed.    Today you received the following chemotherapy and/or immunotherapy agents Rituxan      To help prevent nausea and vomiting after your treatment, we encourage you to take your nausea medication as directed.  BELOW ARE SYMPTOMS THAT SHOULD BE REPORTED IMMEDIATELY: *FEVER GREATER THAN 100.4 F (38 C) OR HIGHER *CHILLS OR SWEATING *NAUSEA AND VOMITING THAT IS NOT CONTROLLED WITH YOUR NAUSEA MEDICATION *UNUSUAL SHORTNESS OF BREATH *UNUSUAL BRUISING OR BLEEDING *URINARY PROBLEMS (pain or burning when urinating, or frequent urination) *BOWEL PROBLEMS (unusual diarrhea, constipation, pain near the anus) TENDERNESS IN MOUTH AND THROAT WITH OR WITHOUT PRESENCE OF ULCERS (sore throat, sores in mouth, or a toothache) UNUSUAL RASH, SWELLING OR PAIN  UNUSUAL VAGINAL DISCHARGE OR ITCHING   Items with * indicate a potential emergency and should be followed up as soon as possible or go to the Emergency Department if any problems should occur.  Please show the CHEMOTHERAPY ALERT CARD or IMMUNOTHERAPY ALERT CARD at check-in to the  Emergency Department and triage nurse.  Should you have questions after your visit or need to cancel or reschedule your appointment, please contact [facility]ICAL [facility]COL[facility]  Dept: [phone]  and follow the prompts.  Office hours are 8:00 a.m. to 4:30 p.m. Monday - Friday. Please note that voicemails left after 4:00 p.m. may not be returned until the following business day.  We are closed weekends and major holidays. You have access to a nurse at all times for urgent questions. Please call the main number to the clinic Dept: [phone] and follow the prompts.   For any non-urgent questions, you may also contact your provider using MyChart. We now offer e-Visits for anyone 18 and older to request care online for non-urgent symptoms. For details visit [email or web address].   Also download the MyChart app! Go to the app store, search "MyChart", open the app, select [place], and log in with your MyChart username and password.  Due to Covid, a mask is required upon entering the hospital/clinic. If you do not have a mask, one will be given to you upon arrival. For doctor visits, patients may have 1 support person aged 18 or older with them. For treatment visits, patients cannot have anyone with them due to current Covid guidelines and our immunocompromised population.   

## 2021-10-26 ENCOUNTER — Other Ambulatory Visit: Payer: Self-pay

## 2021-10-26 DIAGNOSIS — D5912 Cold autoimmune hemolytic anemia: Secondary | ICD-10-CM

## 2021-10-29 ENCOUNTER — Other Ambulatory Visit: Payer: Self-pay

## 2021-10-29 ENCOUNTER — Inpatient Hospital Stay: Payer: Medicare Other

## 2021-10-29 DIAGNOSIS — D5912 Cold autoimmune hemolytic anemia: Secondary | ICD-10-CM

## 2021-10-29 DIAGNOSIS — J189 Pneumonia, unspecified organism: Secondary | ICD-10-CM | POA: Diagnosis not present

## 2021-10-29 LAB — CBC WITH DIFFERENTIAL (CANCER CENTER ONLY)
Abs Immature Granulocytes: 0.07 10*3/uL (ref 0.00–0.07)
Basophils Absolute: 0 10*3/uL (ref 0.0–0.1)
Basophils Relative: 0 %
Eosinophils Absolute: 0 10*3/uL (ref 0.0–0.5)
Eosinophils Relative: 0 %
HCT: 23.1 % — ABNORMAL LOW (ref 36.0–46.0)
Hemoglobin: 7.5 g/dL — ABNORMAL LOW (ref 12.0–15.0)
Immature Granulocytes: 1 %
Lymphocytes Relative: 14 %
Lymphs Abs: 1.4 10*3/uL (ref 0.7–4.0)
MCH: 29.5 pg (ref 26.0–34.0)
MCHC: 32.5 g/dL (ref 30.0–36.0)
MCV: 90.9 fL (ref 80.0–100.0)
Monocytes Absolute: 0.4 10*3/uL (ref 0.1–1.0)
Monocytes Relative: 3 %
Neutro Abs: 8.3 10*3/uL — ABNORMAL HIGH (ref 1.7–7.7)
Neutrophils Relative %: 82 %
Platelet Count: 413 10*3/uL — ABNORMAL HIGH (ref 150–400)
RBC: 2.54 MIL/uL — ABNORMAL LOW (ref 3.87–5.11)
RDW: 20.5 % — ABNORMAL HIGH (ref 11.5–15.5)
WBC Count: 10.2 10*3/uL (ref 4.0–10.5)
nRBC: 0.2 % (ref 0.0–0.2)

## 2021-10-29 LAB — CMP (CANCER CENTER ONLY)
ALT: 14 U/L (ref 0–44)
AST: 50 U/L — ABNORMAL HIGH (ref 15–41)
Albumin: 3.5 g/dL (ref 3.5–5.0)
Alkaline Phosphatase: 195 U/L — ABNORMAL HIGH (ref 38–126)
Anion gap: 8 (ref 5–15)
BUN: 15 mg/dL (ref 8–23)
CO2: 29 mmol/L (ref 22–32)
Calcium: 7.9 mg/dL — ABNORMAL LOW (ref 8.9–10.3)
Chloride: 99 mmol/L (ref 98–111)
Creatinine: 0.49 mg/dL (ref 0.44–1.00)
GFR, Estimated: >60 mL/min (ref >60)
Glucose, Bld: 97 mg/dL (ref 70–99)
Potassium: 4.2 mmol/L (ref 3.5–5.1)
Sodium: 136 mmol/L (ref 135–145)
Total Bilirubin: 3.1 mg/dL — ABNORMAL HIGH (ref 0.3–1.2)
Total Protein: 6 g/dL — ABNORMAL LOW (ref 6.5–8.1)

## 2021-10-29 LAB — PREPARE RBC (CROSSMATCH)

## 2021-10-29 LAB — SAMPLE TO BLOOD BANK

## 2021-10-30 ENCOUNTER — Ambulatory Visit (HOSPITAL_BASED_OUTPATIENT_CLINIC_OR_DEPARTMENT_OTHER)
Admission: RE | Admit: 2021-10-30 | Discharge: 2021-10-30 | Disposition: A | Discharge status: Home / Self Care | Payer: Medicare Other | Source: Ambulatory Visit | Attending: Hematology | Admitting: Hematology

## 2021-10-30 ENCOUNTER — Emergency Department (HOSPITAL_COMMUNITY): Payer: Medicare Other

## 2021-10-30 ENCOUNTER — Inpatient Hospital Stay: Payer: Medicare Other | Admitting: Dietician

## 2021-10-30 ENCOUNTER — Other Ambulatory Visit: Payer: Self-pay | Admitting: Hematology

## 2021-10-30 ENCOUNTER — Other Ambulatory Visit: Payer: Self-pay

## 2021-10-30 ENCOUNTER — Inpatient Hospital Stay (HOSPITAL_COMMUNITY)
Admission: EM | Admit: 2021-10-30 | Discharge: 2021-11-06 | DRG: 193 | Disposition: A | Discharge status: Skilled Nursing Facility | Payer: Medicare Other | Source: Ambulatory Visit | Attending: Family Medicine | Admitting: Family Medicine

## 2021-10-30 ENCOUNTER — Inpatient Hospital Stay: Payer: Medicare Other

## 2021-10-30 ENCOUNTER — Encounter (HOSPITAL_COMMUNITY): Payer: Self-pay | Admitting: Emergency Medicine

## 2021-10-30 VITALS — BP 131/67 | HR 76 | Temp 97.8°F | Resp 18

## 2021-10-30 DIAGNOSIS — E538 Deficiency of other specified B group vitamins: Secondary | ICD-10-CM | POA: Diagnosis present

## 2021-10-30 DIAGNOSIS — Z66 Do not resuscitate: Secondary | ICD-10-CM | POA: Diagnosis present

## 2021-10-30 DIAGNOSIS — Z79899 Other long term (current) drug therapy: Secondary | ICD-10-CM

## 2021-10-30 DIAGNOSIS — S72141D Displaced intertrochanteric fracture of right femur, subsequent encounter for closed fracture with routine healing: Secondary | ICD-10-CM

## 2021-10-30 DIAGNOSIS — J90 Pleural effusion, not elsewhere classified: Secondary | ICD-10-CM

## 2021-10-30 DIAGNOSIS — E43 Unspecified severe protein-calorie malnutrition: Secondary | ICD-10-CM | POA: Diagnosis present

## 2021-10-30 DIAGNOSIS — R911 Solitary pulmonary nodule: Secondary | ICD-10-CM

## 2021-10-30 DIAGNOSIS — M7989 Other specified soft tissue disorders: Secondary | ICD-10-CM | POA: Insufficient documentation

## 2021-10-30 DIAGNOSIS — J440 Chronic obstructive pulmonary disease with acute lower respiratory infection: Secondary | ICD-10-CM | POA: Diagnosis present

## 2021-10-30 DIAGNOSIS — Z7982 Long term (current) use of aspirin: Secondary | ICD-10-CM

## 2021-10-30 DIAGNOSIS — J189 Pneumonia, unspecified organism: Principal | ICD-10-CM | POA: Diagnosis present

## 2021-10-30 DIAGNOSIS — Z9889 Other specified postprocedural states: Secondary | ICD-10-CM

## 2021-10-30 DIAGNOSIS — D5912 Cold autoimmune hemolytic anemia: Secondary | ICD-10-CM

## 2021-10-30 DIAGNOSIS — Z993 Dependence on wheelchair: Secondary | ICD-10-CM

## 2021-10-30 DIAGNOSIS — J9600 Acute respiratory failure, unspecified whether with hypoxia or hypercapnia: Secondary | ICD-10-CM

## 2021-10-30 DIAGNOSIS — L03115 Cellulitis of right lower limb: Secondary | ICD-10-CM | POA: Diagnosis present

## 2021-10-30 DIAGNOSIS — R0902 Hypoxemia: Secondary | ICD-10-CM

## 2021-10-30 DIAGNOSIS — Z87891 Personal history of nicotine dependence: Secondary | ICD-10-CM

## 2021-10-30 DIAGNOSIS — J9601 Acute respiratory failure with hypoxia: Secondary | ICD-10-CM | POA: Diagnosis present

## 2021-10-30 DIAGNOSIS — E876 Hypokalemia: Secondary | ICD-10-CM | POA: Diagnosis not present

## 2021-10-30 DIAGNOSIS — R0603 Acute respiratory distress: Secondary | ICD-10-CM

## 2021-10-30 DIAGNOSIS — Z681 Body mass index (BMI) 19 or less, adult: Secondary | ICD-10-CM

## 2021-10-30 DIAGNOSIS — K5641 Fecal impaction: Secondary | ICD-10-CM | POA: Diagnosis present

## 2021-10-30 DIAGNOSIS — S2232XD Fracture of one rib, left side, subsequent encounter for fracture with routine healing: Secondary | ICD-10-CM

## 2021-10-30 DIAGNOSIS — Z91013 Allergy to seafood: Secondary | ICD-10-CM

## 2021-10-30 DIAGNOSIS — R9389 Abnormal findings on diagnostic imaging of other specified body structures: Secondary | ICD-10-CM

## 2021-10-30 DIAGNOSIS — Z7401 Bed confinement status: Secondary | ICD-10-CM

## 2021-10-30 DIAGNOSIS — F0390 Unspecified dementia without behavioral disturbance: Secondary | ICD-10-CM | POA: Diagnosis present

## 2021-10-30 DIAGNOSIS — Z803 Family history of malignant neoplasm of breast: Secondary | ICD-10-CM

## 2021-10-30 DIAGNOSIS — D472 Monoclonal gammopathy: Secondary | ICD-10-CM | POA: Diagnosis present

## 2021-10-30 DIAGNOSIS — Z20822 Contact with and (suspected) exposure to covid-19: Secondary | ICD-10-CM | POA: Diagnosis present

## 2021-10-30 LAB — COMPREHENSIVE METABOLIC PANEL
ALT: 18 U/L (ref 0–44)
AST: 51 U/L — ABNORMAL HIGH (ref 15–41)
Albumin: 3.2 g/dL — ABNORMAL LOW (ref 3.5–5.0)
Alkaline Phosphatase: 174 U/L — ABNORMAL HIGH (ref 38–126)
Anion gap: 9 (ref 5–15)
BUN: 18 mg/dL (ref 8–23)
CO2: 27 mmol/L (ref 22–32)
Calcium: 7.5 mg/dL — ABNORMAL LOW (ref 8.9–10.3)
Chloride: 99 mmol/L (ref 98–111)
Creatinine, Ser: 0.57 mg/dL (ref 0.44–1.00)
GFR, Estimated: >60 mL/min (ref >60)
Glucose, Bld: 95 mg/dL (ref 70–99)
Potassium: 4.4 mmol/L (ref 3.5–5.1)
Sodium: 135 mmol/L (ref 135–145)
Total Bilirubin: 2.9 mg/dL — ABNORMAL HIGH (ref 0.3–1.2)
Total Protein: 6 g/dL — ABNORMAL LOW (ref 6.5–8.1)

## 2021-10-30 LAB — URINALYSIS, ROUTINE W REFLEX MICROSCOPIC
Bacteria, UA: NONE SEEN
Bilirubin Urine: NEGATIVE
Glucose, UA: NEGATIVE mg/dL
Ketones, ur: NEGATIVE mg/dL
Leukocytes,Ua: NEGATIVE
Nitrite: NEGATIVE
Protein, ur: NEGATIVE mg/dL
Specific Gravity, Urine: 1.003 — ABNORMAL LOW (ref 1.005–1.030)
pH: 5 (ref 5.0–8.0)

## 2021-10-30 LAB — CBC
HCT: 32 % — ABNORMAL LOW (ref 36.0–46.0)
Hemoglobin: 10.8 g/dL — ABNORMAL LOW (ref 12.0–15.0)
MCH: 31.6 pg (ref 26.0–34.0)
MCHC: 33.8 g/dL (ref 30.0–36.0)
MCV: 93.6 fL (ref 80.0–100.0)
Platelets: 410 10*3/uL — ABNORMAL HIGH (ref 150–400)
RBC: 3.42 MIL/uL — ABNORMAL LOW (ref 3.87–5.11)
RDW: 20.8 % — ABNORMAL HIGH (ref 11.5–15.5)
WBC: 5 10*3/uL (ref 4.0–10.5)
nRBC: 0 % (ref 0.0–0.2)

## 2021-10-30 LAB — BLOOD GAS, ARTERIAL
Acid-Base Excess: 4.8 mmol/L — ABNORMAL HIGH (ref 0.0–2.0)
Bicarbonate: 29.3 mmol/L — ABNORMAL HIGH (ref 20.0–28.0)
Drawn by: 25770
FIO2: 40
O2 Saturation: 98.4 %
Patient temperature: 98
pCO2 arterial: 45.6 mmHg (ref 32.0–48.0)
pH, Arterial: 7.422 (ref 7.350–7.450)
pO2, Arterial: 97.5 mmHg (ref 83.0–108.0)

## 2021-10-30 LAB — RESP PANEL BY RT-PCR (FLU A&B, COVID) ARPGX2
Influenza A by PCR: NEGATIVE
Influenza B by PCR: NEGATIVE
SARS Coronavirus 2 by RT PCR: NEGATIVE

## 2021-10-30 LAB — JAK2 (INCLUDING V617F AND EXON 12), MPL,& CALR-NEXT GEN SEQ

## 2021-10-30 MED ORDER — AZITHROMYCIN 500 MG IV SOLR
500.0000 mg | Freq: Once | INTRAVENOUS | Status: AC
  Administered 2021-10-30: 500 mg via INTRAVENOUS
  Filled 2021-10-30: qty 5

## 2021-10-30 MED ORDER — ACETAMINOPHEN 325 MG PO TABS
650.0000 mg | ORAL_TABLET | Freq: Four times a day (QID) | ORAL | Status: DC | PRN
  Administered 2021-11-02 – 2021-11-05 (×3): 650 mg via ORAL
  Filled 2021-10-30 (×3): qty 2

## 2021-10-30 MED ORDER — ASPIRIN EC 325 MG PO TBEC
325.0000 mg | DELAYED_RELEASE_TABLET | Freq: Every day | ORAL | Status: DC
  Administered 2021-10-31 – 2021-11-06 (×7): 325 mg via ORAL
  Filled 2021-10-30 (×7): qty 1

## 2021-10-30 MED ORDER — PANTOPRAZOLE SODIUM 40 MG PO TBEC
40.0000 mg | DELAYED_RELEASE_TABLET | Freq: Every day | ORAL | Status: DC
  Administered 2021-10-31 – 2021-11-06 (×7): 40 mg via ORAL
  Filled 2021-10-30 (×7): qty 1

## 2021-10-30 MED ORDER — SODIUM CHLORIDE 0.9 % IV SOLN
1.0000 g | Freq: Once | INTRAVENOUS | Status: AC
  Administered 2021-10-30: 1 g via INTRAVENOUS
  Filled 2021-10-30: qty 10

## 2021-10-30 MED ORDER — ACETAMINOPHEN 325 MG PO TABS
650.0000 mg | ORAL_TABLET | Freq: Once | ORAL | Status: AC
  Administered 2021-10-30: 650 mg via ORAL
  Filled 2021-10-30: qty 2

## 2021-10-30 MED ORDER — ONDANSETRON HCL 4 MG/2ML IJ SOLN
4.0000 mg | Freq: Four times a day (QID) | INTRAMUSCULAR | Status: DC | PRN
  Administered 2021-10-31: 4 mg via INTRAVENOUS
  Filled 2021-10-30: qty 2

## 2021-10-30 MED ORDER — VITAMIN B-12 1000 MCG PO TABS
2000.0000 ug | ORAL_TABLET | Freq: Every day | ORAL | Status: DC
  Administered 2021-10-31 – 2021-11-06 (×7): 2000 ug via ORAL
  Filled 2021-10-30 (×7): qty 2

## 2021-10-30 MED ORDER — ADULT MULTIVITAMIN W/MINERALS CH
1.0000 | ORAL_TABLET | Freq: Every day | ORAL | Status: DC
  Administered 2021-10-31 – 2021-11-06 (×7): 1 via ORAL
  Filled 2021-10-30 (×7): qty 1

## 2021-10-30 MED ORDER — QUETIAPINE FUMARATE 25 MG PO TABS
25.0000 mg | ORAL_TABLET | Freq: Every day | ORAL | Status: DC
  Administered 2021-10-31 – 2021-11-05 (×7): 25 mg via ORAL
  Filled 2021-10-30 (×7): qty 1

## 2021-10-30 MED ORDER — SENNOSIDES-DOCUSATE SODIUM 8.6-50 MG PO TABS: 1.0000 | ORAL_TABLET | Freq: Every evening | ORAL | Status: DC | PRN

## 2021-10-30 MED ORDER — SODIUM CHLORIDE 0.9 % IV SOLN
500.0000 mg | INTRAVENOUS | Status: AC
  Administered 2021-10-31 – 2021-11-03 (×4): 500 mg via INTRAVENOUS
  Filled 2021-10-30 (×4): qty 5

## 2021-10-30 MED ORDER — DOCUSATE SODIUM 100 MG PO CAPS
100.0000 mg | ORAL_CAPSULE | Freq: Two times a day (BID) | ORAL | Status: DC
  Administered 2021-10-31 – 2021-11-06 (×12): 100 mg via ORAL
  Filled 2021-10-30 (×13): qty 1

## 2021-10-30 MED ORDER — MELATONIN 3 MG PO TABS
3.0000 mg | ORAL_TABLET | Freq: Every evening | ORAL | Status: DC | PRN
  Administered 2021-10-31 – 2021-11-05 (×6): 3 mg via ORAL
  Filled 2021-10-30 (×6): qty 1

## 2021-10-30 MED ORDER — ONDANSETRON HCL 4 MG PO TABS: 4.0000 mg | ORAL_TABLET | Freq: Four times a day (QID) | ORAL | Status: DC | PRN

## 2021-10-30 MED ORDER — ALBUTEROL SULFATE (2.5 MG/3ML) 0.083% IN NEBU: 2.5000 mg | INHALATION_SOLUTION | RESPIRATORY_TRACT | Status: DC | PRN

## 2021-10-30 MED ORDER — FOLIC ACID 1 MG PO TABS
2.0000 mg | ORAL_TABLET | Freq: Every day | ORAL | Status: DC
  Administered 2021-10-31 – 2021-11-06 (×7): 2 mg via ORAL
  Filled 2021-10-30 (×7): qty 2

## 2021-10-30 MED ORDER — ACETAMINOPHEN 650 MG RE SUPP: 650.0000 mg | Freq: Four times a day (QID) | RECTAL | Status: DC | PRN

## 2021-10-30 MED ORDER — FUROSEMIDE 10 MG/ML IJ SOLN
20.0000 mg | Freq: Once | INTRAMUSCULAR | Status: AC
  Administered 2021-10-30: 20 mg via INTRAVENOUS
  Filled 2021-10-30: qty 2

## 2021-10-30 MED ORDER — SODIUM CHLORIDE 0.9 % IV SOLN
2.0000 g | INTRAVENOUS | Status: AC
  Administered 2021-10-31 – 2021-11-03 (×4): 2 g via INTRAVENOUS
  Filled 2021-10-30 (×4): qty 20

## 2021-10-30 MED ORDER — SODIUM CHLORIDE 0.9% IV SOLUTION
250.0000 mL | Freq: Once | INTRAVENOUS | Status: AC
  Administered 2021-10-30: 250 mL via INTRAVENOUS

## 2021-10-30 MED ORDER — METHYLPREDNISOLONE SODIUM SUCC 40 MG IJ SOLR
40.0000 mg | Freq: Once | INTRAMUSCULAR | Status: AC
  Administered 2021-10-30: 40 mg via INTRAVENOUS
  Filled 2021-10-30: qty 1

## 2021-10-30 NOTE — Assessment & Plan Note (Addendum)
Follows with hematology, Dr. Irene Limbo.  Has been receiving Rituxan and frequent PRBC transfusions.  Received 1 unit PRBC at cancer center day of admission on 10/30/2021. Hemoglobin 10.8 on admission Stable to 9 today at discharge Appreciate oncology assistance (2/10 note) - follow up with Dr. Irene Limbo after discharge to reevaluate resuming rituximab, B12 2023 mcg daily, folic acid 2 mg  **Would recommend CBC andallhemolytic labs to be collected in prewarmed tubes at 37 degreesand samples to be run at 37C to get accurate results** **The patient needs to be kept in [place] [facility] warm room and be dressed and warm cloths from head to toe limiting any open skin**. **All IV fluids and blood products need to be given through [employer] blood warmer**

## 2021-10-30 NOTE — Progress Notes (Incomplete)
Notified Lab that ABG being sent for analysis. Placed PT on 5 LPM nasal cannula- Sp02 100%. Decreased to 3 LPM (PT does not use supplemental 02 at home.

## 2021-10-30 NOTE — H&P (Signed)
History and Physical    [name] GGY:[id number] DOB: 05-10-1941 DOA: 10/30/2021  PCP: [name], MD  Patient coming from: SNF  I have personally briefly reviewed patient's old medical records in [place]  Chief Complaint: Hypoxia  HPI: [name] is a 81 y.o. 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  History limited from patient due to dementia and is otherwise obtained by EP, chart review, and patient's sister at bedside.  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  Per documentation, she was noted to be hypoxic with SPO2 53% on room air.  She was placed on 5 L O2 via [place] with SPO2 improved to 100%.  She received 1 unit PRBC transfusion.  She remained hypoxic and was transferred to the ED on nonrebreather at 5 L O2.  Patient reports intermittent cough occasionally productive of mucus.  She reports chest wall discomfort related to left ninth rib fracture last month.  She was also having some continued right hip pain from recent hip fracture.  She otherwise denies any dyspnea, nausea, vomiting, dysphagia, abdominal pain, dysuria, diarrhea.  Patient's sister notes that she had significant erythema to her right lower leg as well as some around her left ankle.  She was started on a course of Augmentin on 10/25/2021 with significant improvement in the erythema.  Sister states that patient is currently residing at [facility].  She is essentially bed and wheelchair bound, only transfers with assistance.  ED Course   Labs/Imaging on admission: I have personally reviewed following labs and  imaging studies.  Initial vitals showed BP 131/67, pulse 76, RR 18, temp 97.8 F, SPO2 90% on 5 L supplemental O2 via [place].  Labs show sodium 135, potassium 4.4, bicarb 27, BUN 18, creatinine 0.57, serum glucose 95, AST 51, ALT 18, alk phos 174, total bilirubin 2.9, WBC 5.0, hemoglobin 10.8, platelets 410,000.  SARS-CoV-2 and influenza PCR negative.  Blood cultures in process.  Urinalysis negative for UTI.  ABG shows pH 7.422, PCO2 45.6, PO2 97.5 on 40% FiO2.  Chest x-ray shows chronic elevation of the left hemidiaphragm with increased left basilar consolidation and effusion compared to prior.  Patient was given IV ceftriaxone and azithromycin.  The hospitalist service was consulted to admit for further evaluation and management.  Review of Systems:  All systems reviewed and are negative except as documented in history of present illness above.   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[place])    montiored by dr Marko Plume   Intraventricular hemorrhage ([place]) 10/03/2021   Renal and ureteric calculus    left    Past Surgical History:  Procedure Laterality Date   [place]   BREAST EXCISIONAL BIOPSY Right 1984   CATARACT EXTRACTION W/  INTRAOCULAR LENS IMPLANT Right 1990's   congential cataract   CYSTOSCOPY W/ URETERAL STENT PLACEMENT  11/12/2015   Procedure: CYSTOSCOPY WITH RETROGRADE PYELOGRAM/URETERAL STENT PLACEMENT;  Surgeon: [name], MD;  Location: [facility];  Service: Urology;;   CYSTOSCOPY W/ URETERAL STENT REMOVAL Left 11/22/2015   Procedure: CYSTOSCOPY WITH STENT REMOVAL;  Surgeon: [name], MD;   Location: [facility];  Service: Urology;  Laterality: Left;   CYSTOSCOPY/URETEROSCOPY/HOLMIUM LASER Left 11/22/2015   Procedure: CYSTOSCOPY/URETEROSCOPY/HOLMIUM LASER;  Surgeon: [name], MD;  Location: [facility];  Service: Urology;  Laterality: Left;  FLEX URETEROSCOPY   FEMUR IM NAIL Left 08/04/2015   Procedure: INTRAMEDULLARY (IM) NAIL FEMORAL;  Surgeon: [name], MD;  Location: [facility];  Service: Orthopedics;  Laterality: Left;   INTRAMEDULLARY (IM) NAIL INTERTROCHANTERIC Right 10/04/2021   Procedure: INTRAMEDULLARY (IM) NAIL INTERTROCHANTRIC;  Surgeon: [name], MD;  Location: [facility];  Service: Orthopedics;  Laterality: Right;   IR KYPHO THORACIC WITH BONE BIOPSY  06/22/2018   TONSILLECTOMY  age 69    Social History:  reports that she quit smoking about 6 years ago. Her smoking use included cigarettes. She smoked an average of 1 pack per day. She has never used smokeless tobacco. She reports current alcohol use. She reports that she does not use drugs.  Allergies  Allergen Reactions   Other Other (See Comments)    "Most antibiotics give yeast infection"   Shellfish Allergy Hives    Family History  Problem Relation Age of Onset   Breast cancer Cousin      Prior to Admission medications   Medication Sig Start Date End Date Taking? Authorizing Provider  acetaminophen (TYLENOL) 500 MG tablet Take 500 mg by mouth every 6 (six) hours as needed for moderate pain.    [provider]  amoxicillin-clavulanate (AUGMENTIN) 875-125 MG tablet Take 1 tablet by mouth 2 (two) times daily for 10 days. 10/25/21 11/04/21  Brunetta Genera, MD  aspirin EC 325 MG tablet Take 1 tablet (325 mg total) by mouth daily. X 5 weeks for DVT prophylaxis 10/09/21   [name], [name], MD  docusate sodium (COLACE) 100 MG capsule Take 1 capsule (100 mg total) by mouth 2 (two) times daily. 10/09/21   [name], [name], MD  doxycycline (VIBRA-TABS) 100 MG tablet Take 1  tablet (100 mg total) by mouth 2 (two) times daily for 5 days. 10/25/21 10/30/21  Brunetta Genera, MD  folic acid (FOLVITE) 1 MG tablet Take 2 tablets (2 mg total) by mouth daily. 10/10/21   [name], [name], MD  loratadine (CLARITIN) 10 MG tablet Take 10 mg by mouth daily as needed for allergies.    [provider]  melatonin 3 MG TABS tablet Take 1 tablet (3 mg total) by mouth at bedtime as needed (insomnia). 10/09/21   [name], [name], MD  Multiple Vitamin (MULTIVITAMIN WITH MINERALS) TABS tablet Take 1 tablet by mouth daily. 10/10/21   [name], [name], MD  pantoprazole (PROTONIX) 40 MG tablet Take 1 tablet (40 mg total) by mouth daily at 6 (six) AM. 10/10/21   [name], Ripudeep K, MD  QUEtiapine (SEROQUEL) 25 MG tablet Take 1 tablet (25 mg total) by mouth at bedtime. 10/09/21   [name], [name], MD  saccharomyces boulardii (FLORASTOR) 250 MG capsule Take 1 capsule (250 mg total) by mouth 2 (two) times daily for 15 days. 10/25/21 11/09/21  Brunetta Genera, MD  traMADol (ULTRAM) 50 MG tablet Take 1  tablet (50 mg total) by mouth every 6 (six) hours as needed for moderate pain or severe pain. 10/09/21   [name], [name], MD  vitamin B-12 2000 MCG tablet Take 1 tablet (2,000 mcg total) by mouth daily. 08/21/21   [name], MD    Physical Exam: Vitals:   10/30/21 2030 10/30/21 2045 10/30/21 2100 10/30/21 2115  BP: (!) 121/49 (!) 124/52 (!) 124/47 (!) 117/53  Pulse: (!) 59 63 62 (!) 57  Resp: (!) '22 20 19 18  ' Temp:      TempSrc:      SpO2: 97% 96% 98% 99%  Weight:      Height:       Constitutional: Chronically ill-appearing thin elderly woman resting in bed in the left lateral decubitus position, NAD, calm, comfortable Eyes: PERRL, lids and conjunctivae normal ENMT: Mucous membranes are moist. Posterior pharynx clear of any exudate or lesions.Normal dentition.  Neck: normal, supple, no masses. Respiratory: clear to auscultation bilaterally, no wheezing, no crackles. Normal respiratory  effort while on 3 L O2 via [place]. No accessory muscle use.  Cardiovascular: Regular rate and rhythm, no murmurs / rubs / gallops. No extremity edema. 2+ pedal pulses. Abdomen: no tenderness, no masses palpated. No hepatosplenomegaly. Bowel sounds positive.  Musculoskeletal: no clubbing / cyanosis. No joint deformity upper and lower extremities.  Skin: Erythema distal right lower extremity and right above the left ankle, improved appearance per family. No induration Neurologic: CN 2-12 grossly intact. Sensation intact. Strength 5/5 in all 4.  Psychiatric: Alert and oriented x 3.  EKG: Personally reviewed. Sinus bradycardia, rate 57, low voltage.  Rate is slower when compared to prior.  Assessment/Plan Principal Problem:   Acute respiratory failure with hypoxia (HCC) Active Problems:   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)   Cellulitis of right lower extremity   Dementia (HCC)   [name] is a 81 y.o. 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  Assessment and Plan: * Acute respiratory failure with hypoxia ([place])- (present on admission) SPO2 as low as 53% on room air, requiring 3 L O2 via [place] on admission.  CXR shows increased left basilar pneumonia versus effusion. -Continue empiric antibiotics with IV ceftriaxone and azithromycin -Follow blood cultures, sputum culture, strep pneumonia urinary antigen -Continue supplemental oxygen as needed -No escalation to BiPAP per family wishes  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 [facility])- (present on admission) Follows with hematology, Dr. Irene Limbo.  Has been receiving Rituxan and frequent PRBC transfusions.  Received 1 unit PRBC at cancer center  day of admission on 10/30/2021. -Hemoglobin 10.8 on admission, much improved from baseline Hgb of 6-7s. -Repeat CBC in a.m.  Cellulitis of right lower extremity- (present on admission) Improving with oral antibiotics per family.  She is on IV ceftriaxone as above.  Dementia ([place])- (present on admission) Chronic dementia at baseline.  Continue Seroquel and delirium precautions.  DVT prophylaxis: SCDs Start: 10/30/21 2015 Code Status: DNR/DNI Family Communication: Discussed with patient's sister at bedside Disposition Plan: From SNF and likely discharge to SNF pending clinical progress Consults called: None Severity of Illness: The appropriate patient status for this patient is OBSERVATION. Observation status is judged to be reasonable and necessary in order to provide the required intensity of service to  ensure the patient's safety. The patient's presenting symptoms, physical exam findings, and initial radiographic and laboratory data in the context of their medical condition is felt to place them at decreased risk for further clinical deterioration. Furthermore, it is anticipated that the patient will be medically stable for discharge from the hospital within 2 midnights of admission.   Zada Finders MD [facility]  If 7PM-7AM, please contact night-coverage [email or web address]  10/30/2021, 9:47 PM

## 2021-10-30 NOTE — Progress Notes (Signed)
Pt transferred to [facility] ED per instructions from [facility] MD. Pt transferred on continuous O2 monitoring on 5L on a nonrebreather. Report given to charge nurse and receiving nurse in the ED.

## 2021-10-30 NOTE — ED Triage Notes (Signed)
Pt brought in from cancer center d/t low oxygen saturations. Placed on 4 liters during blood transfusion. Received 1 unit PRBCs. Hx of cold agglutination disease and dementia.  Sats 98% on non rebreather. Rhonchi lung sounds.

## 2021-10-30 NOTE — ED Provider Notes (Signed)
°  Provider Note MRN:  [id number]  Arrival date & time: 10/31/21    ED Course and Medical Decision Making  Assumed care from Dr Jeanell Sparrow at shift change.  See not from prior team for complete details, in brief: 81 year old female history of dementia, right hip fracture, sent from cancer center for hypoxia.  Symptoms did not improve following blood transfusion and was sent to the ER for evaluation.  No home oxygen use  Chest x-ray is concerning for pneumonia, broad-spectrum antibiotics started, culture sent.  Patient is requiring supplemental oxygen.  Confirmed DNR status no BiPAP or intubation.  On 3L [place]  She is HDS  Respiratory Distress likely secondary to pneumonia.  Recommend admission, pt/family is agreeable.   D/w Dr Posey Pronto who accepts pt for admission  .Critical Care Performed by: Jeanell Sparrow, DO Authorized by: Jeanell Sparrow, DO   Critical care provider statement:    Critical care time (minutes):  33   Critical care time was exclusive of:  Separately billable procedures and treating other patients   Critical care was necessary to treat or prevent imminent or life-threatening deterioration of the following conditions:  Respiratory failure   Critical care was time spent personally by me on the following activities:  Development of treatment plan with patient or surrogate, discussions with consultants, evaluation of patient's response to treatment, examination of patient, ordering and review of laboratory studies, ordering and review of radiographic studies, ordering and performing treatments and interventions, pulse oximetry, re-evaluation of patient's condition, review of old charts and obtaining history from patient or surrogate   Care discussed with: admitting provider    Final Clinical Impressions(s) / ED Diagnoses     ICD-10-CM   1. Respiratory distress  R06.03     2. Community acquired pneumonia, unspecified laterality  J18.9     3. Acute respiratory failure, unspecified  whether with hypoxia or hypercapnia (HCC)  J96.00     4. Hypoxia  R09.02       ED Discharge Orders     None       Discharge Instructions   None        Jeanell Sparrow, DO 10/31/21 0120

## 2021-10-30 NOTE — Progress Notes (Signed)
Pt satting at 53% on room air with good pleth upon intake. [place] initiated at 5L. Saturation quickly recovered to 100%. MD [facility] notified. MD arrived for bedside assessment. MD Titrated O2 down, witnessed further desatting in the 98s. MD Advised that pt be on 2L  during treatment today.

## 2021-10-30 NOTE — ED Notes (Signed)
Coming from cancer center, states he O2 levels low-sending to ED for eval

## 2021-10-30 NOTE — Hospital Course (Addendum)
[name] is [name] 81 y.o. 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  She's improved with IV antibiotics.  She had CT scan which showed effusion which was drained by IR.  Plan is for follow up cytology and final culture with oncology outpatient.  She's intermittently required 1-2 L oxygen and will discharge with oxygen as needed to maintain sats over 90%.  She had consolidative/collapse changes in the lingula and left lower lobe which will need to be followed outpatient as well as her exudative effusion.  See below for additional details

## 2021-10-30 NOTE — Assessment & Plan Note (Signed)
Chronic dementia at baseline.  Continue Seroquel and delirium precautions.

## 2021-10-30 NOTE — ED Provider Notes (Signed)
[place] [facility] DEPT Provider Note   CSN: [id number] Arrival date & time: 10/30/21  1411     History  Chief Complaint  Patient presents with   Respiratory Distress    ELLANORA RAYBORN is a 81 y.o. female.  HPI 81 year old female history of dementia, history of recent right hip fracture, sent today from cancer center with reports of low oxygen saturations sent to ed for evaluation.  HO obtained through patient's sister, at bedside, who is healthcare power of attorney.  Also obtained through notes.  Reviewed ED triage nursing notes.  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  Patient's sister states that since her hip fracture, she has been at a rehab facility.  She has not noted any significant change from baseline.    Home Medications Prior to Admission medications   Medication Sig Start Date End Date Taking? Authorizing Provider  acetaminophen (TYLENOL) 500 MG tablet Take 500 mg by mouth every 6 (six) hours as needed for moderate pain.    [provider]  amoxicillin-clavulanate (AUGMENTIN) 875-125 MG tablet Take 1 tablet by mouth 2 (two) times daily for 10 days. 10/25/21 11/04/21  Brunetta Genera, MD  aspirin EC 325 MG tablet Take 1 tablet (325 mg total) by mouth daily. X 5 weeks for DVT prophylaxis 10/09/21   [name], [name], MD  docusate sodium (COLACE) 100 MG capsule Take 1 capsule (100 mg total) by mouth 2 (two) times daily. 10/09/21   [name], [name], MD  doxycycline (VIBRA-TABS) 100 MG tablet Take 1 tablet (100 mg total) by mouth 2 (two) times daily for 5 days. 10/25/21 10/30/21  Brunetta Genera, MD  folic acid (FOLVITE) 1 MG tablet Take 2 tablets (2 mg total) by mouth daily. 10/10/21   [name], [name], MD  loratadine (CLARITIN) 10 MG tablet Take 10 mg by mouth daily as needed for allergies.    [provider]  melatonin 3 MG TABS tablet Take 1 tablet (3 mg total) by mouth at bedtime as needed  (insomnia). 10/09/21   [name], [name], MD  Multiple Vitamin (MULTIVITAMIN WITH MINERALS) TABS tablet Take 1 tablet by mouth daily. 10/10/21   [name], [name], MD  pantoprazole (PROTONIX) 40 MG tablet Take 1 tablet (40 mg total) by mouth daily at 6 (six) AM. 10/10/21   [name], Ripudeep K, MD  QUEtiapine (SEROQUEL) 25 MG tablet Take 1 tablet (25 mg total) by mouth at bedtime. 10/09/21   [name], [name], MD  saccharomyces boulardii (FLORASTOR) 250 MG capsule Take 1 capsule (250 mg total) by mouth 2 (two) times daily for 15 days. 10/25/21 11/09/21  Brunetta Genera, MD  traMADol (ULTRAM) 50 MG tablet Take 1 tablet (50 mg total) by mouth every 6 (six) hours as needed for moderate pain or severe pain. 10/09/21   [name], [name], MD  vitamin B-12 2000 MCG tablet Take 1 tablet (2,000 mcg total) by mouth daily. 08/21/21   [name], MD      Allergies    Other and Shellfish allergy    Review of Systems   Review of Systems  All other systems reviewed and are negative.  Physical Exam Updated Vital Signs BP (!) 124/45    Pulse 61    Temp 98.3 F (36.8 C) (Rectal)    Resp 14    Ht 1.549 m (5\' 1" )    Wt 42.6 kg    SpO2 98%    BMI 17.75 kg/m  Physical Exam Vitals and nursing note reviewed.  HENT:     Head: Normocephalic and atraumatic.     Right Ear: External ear normal.     Left Ear: External ear normal.     Nose: Nose normal.     Mouth/Throat:     Mouth: Mucous membranes are dry.     Comments: Nonrebreather mask in place Cardiovascular:     Rate and Rhythm: Normal rate.     Comments: Decreased pulses bilateral lower extremities Pulmonary:     Effort: Pulmonary effort is normal.     Breath sounds: Normal breath sounds.  Abdominal:     Comments: Suprapubic distention No tenderness  Musculoskeletal:     Cervical back: Normal range of motion.     Comments: Bilateral lower extremity erythema right greater than left Right hip held somewhat in flexion with healing surgical site no signs of  inflammation, redness, or fluctuance  Skin:    General: Skin is warm and dry.     Capillary Refill: Capillary refill takes less than 2 seconds.  Neurological:     Mental Status: She is alert.     Comments: Voices pain when palpating lower extremities.  She voices pain with rectal exam.  Most of the time she is lying with eyes closed and is not participating in her history. There is not appear to be any focal, lateralized neurological deficits.    ED Results / Procedures / Treatments   Labs (all labs ordered are listed, but only abnormal results are displayed) Labs Reviewed  BLOOD GAS, ARTERIAL - Abnormal; Notable for the following components:      Result Value   Bicarbonate 29.3 (*)    Acid-Base Excess 4.8 (*)    All other components within normal limits  CULTURE, BLOOD (ROUTINE X 2)  CULTURE, BLOOD (ROUTINE X 2)  CBC  COMPREHENSIVE METABOLIC PANEL  URINALYSIS, ROUTINE W REFLEX MICROSCOPIC    EKG EKG Interpretation  Date/Time:  Tuesday October 30 2021 15:18:44 EST Ventricular Rate:  57 PR Interval:  148 QRS Duration: 98 QT Interval:  438 QTC Calculation: 427 R Axis:   98 Text Interpretation: Sinus rhythm Right axis deviation Borderline low voltage, extremity leads Confirmed by Pattricia Boss [phone]) on 10/30/2021 3:49:48 PM  Radiology DG Chest Port 1 View  Result Date: 10/30/2021 CLINICAL DATA:  Short of breath, respiratory distress EXAM: PORTABLE CHEST 1 VIEW COMPARISON:  10/03/2021 FINDINGS: Single frontal view of the chest was obtained with the patient rotated markedly toward the left. The cardiac silhouette is stable. There is chronic elevation of the left hemidiaphragm, with increased left basilar consolidation and effusion since prior exam. Minimal hypoventilatory changes at the right base. No pneumothorax. No acute bony abnormalities. IMPRESSION: 1. Progressive left basilar consolidation and/or effusion. Electronically Signed   By: Randa Ngo M.D.   On: 10/30/2021  15:33   VAS [place] LOWER EXTREMITY VENOUS (DVT)  Result Date: 10/30/2021  Lower Venous DVT Study Patient Name:  DELANE STALLING  Date of Exam:   10/30/2021 Medical Rec #: [id number]             Accession #:    [id number] Date of Birth: 10-Aug-1941            Patient Gender: F Patient Age:   47 years Exam Location:  [facility] Procedure:      VAS [place] LOWER EXTREMITY VENOUS (DVT) Referring Phys: Sullivan Lone --------------------------------------------------------------------------------  Indications: Swelling.  Risk Factors: Cancer. Limitations: Poor ultrasound/tissue interface.  Comparison Study: No prior studies. Performing Technologist: Oliver Hum RVT  Examination Guidelines: A complete evaluation includes B-mode imaging, spectral Doppler, color Doppler, and power Doppler as needed of all accessible portions of each vessel. Bilateral testing is considered an integral part of a complete examination. Limited examinations for reoccurring indications may be performed as noted. The reflux portion of the exam is performed with the patient in reverse Trendelenburg.  +---------+---------------+---------+-----------+----------+--------------+  RIGHT     Compressibility Phasicity Spontaneity Properties Thrombus Aging  +---------+---------------+---------+-----------+----------+--------------+  CFV       Full            Yes       Yes                                    +---------+---------------+---------+-----------+----------+--------------+  SFJ       Full                                                             +---------+---------------+---------+-----------+----------+--------------+  FV Prox   Full                                                             +---------+---------------+---------+-----------+----------+--------------+  FV Mid    Full                                                             +---------+---------------+---------+-----------+----------+--------------+  FV Distal Full                                                              +---------+---------------+---------+-----------+----------+--------------+  PFV       Full                                                             +---------+---------------+---------+-----------+----------+--------------+  POP       Full            Yes       Yes                                    +---------+---------------+---------+-----------+----------+--------------+  PTV       Full                                                             +---------+---------------+---------+-----------+----------+--------------+  PERO      Full                                                             +---------+---------------+---------+-----------+----------+--------------+   +---------+---------------+---------+-----------+----------+--------------+  LEFT      Compressibility Phasicity Spontaneity Properties Thrombus Aging  +---------+---------------+---------+-----------+----------+--------------+  CFV       Full            Yes       Yes                                    +---------+---------------+---------+-----------+----------+--------------+  SFJ       Full                                                             +---------+---------------+---------+-----------+----------+--------------+  FV Prox   Full                                                             +---------+---------------+---------+-----------+----------+--------------+  FV Mid    Full                                                             +---------+---------------+---------+-----------+----------+--------------+  FV Distal Full                                                             +---------+---------------+---------+-----------+----------+--------------+  PFV       Full                                                             +---------+---------------+---------+-----------+----------+--------------+  POP       Full            Yes       Yes                                     +---------+---------------+---------+-----------+----------+--------------+  PTV       Full                                                             +---------+---------------+---------+-----------+----------+--------------+  PERO      Full                                                             +---------+---------------+---------+-----------+----------+--------------+    Summary: RIGHT: - There is no evidence of deep vein thrombosis in the lower extremity. However, portions of this examination were limited- see technologist comments above.  - No cystic structure found in the popliteal fossa.  LEFT: - There is no evidence of deep vein thrombosis in the lower extremity.  - No cystic structure found in the popliteal fossa.  *See table(s) above for measurements and observations.    Preliminary     Procedures Procedures    Medications Ordered in ED Medications  cefTRIAXone (ROCEPHIN) 1 g in sodium chloride 0.9 % 100 mL IVPB (has no administration in time range)  azithromycin (ZITHROMAX) 500 mg in sodium chloride 0.9 % 250 mL IVPB (has no administration in time range)    ED Course/ Medical Decision Making/ A&P                           Medical Decision Making 81 yo female recent hip fx sent in with respiratory distress with new oxygen requirement on nrb 5 l/m Patient with baseline dementia POA sister states no intubation, bipap, pressors or cpr    Amount and/or Complexity of Data Reviewed Independent Historian: caregiver    Details: Sister poa at bedside Labs: ordered. Decision-making details documented in ED Course.    Details: Hemglobin of 7.5 yesterday.  Patient has been receiving blood transfusions.  She does not appear to be acutely compensated today Radiology:  Decision-making details documented in ED Course. ECG/medicine tests:  Decision-making details documented in ED Course. Discussion of management or test interpretation with external provider(s): Patient with increased  respiratory distress-increased infiltrate on cxr- rocephin and zithromax given Baseline anemia appears stable DNR status per sister  Plan admission for treatment of cap, respiratory failure, anemia              Final Clinical Impression(s) / ED Diagnoses Final diagnoses:  None    Rx / DC Orders ED Discharge Orders     None         Pattricia Boss, MD 10/30/21 1721

## 2021-10-30 NOTE — Progress Notes (Signed)
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 She is receiving Rituxan q7 days.   Past medical history includes B12 deficiency, bilateral carotid artery stenosis, COPD, intraventricular hemorrhage, vit D deficiency, dementia, severe PCM -recent hospital admission (1/10-1/17) for right hip fracture and left ninth rib fracture after fall at home. She is s/p ORIF on 10/04/21.   Met with patient during infusion. She is resting this morning, but is awakened for vital check by RN and agreeable to visit. Patient is a poor historian, caregiver at bedside reading. Patient reports she has a good appetite. She had eggs and bacon for breakfast this morning. Patient reports she cooked this herself, however caregiver reminds patient that she did not prepare this meal. Patient typically eats 3 meals day. Caregiver reports patient has consumed Ensure in the past. Patient denies nausea, vomiting, diarrhea, constipation.   Nutrition Focused Physical Exam: unable to complete, noted moderate/severe fat and muscle depletions per inpatient RD exam completed 10/03/21. Patient diagnosed with severe malnutrition related to dementia, hemolytic anemia as evidenced by severe fat/muscle depletion.   Medications: colace, folvite, melatonin, MVI, protonix, seroquel, tramadol, B12   Labs: reviewed    Anthropometrics:   Height: 5' Weight: 94 lb (10/04/21) UBW: 96 lb (09/13/21) BMI: 18.4   NUTRITION DIAGNOSIS: Severe malnutrition ongoing   INTERVENTION:  Educated on increased calories and protein to support post-op healing Encouraged high calorie, high protein foods - handout with ideas provided Recommend drinking 2 Ensure Plus/equivalent daily - coupons and samples provided for pt to try  Contact information given    MONITORING, EVALUATION, GOAL: Patient will tolerate  increased calories and protein to promote weight gain   Next Visit: To be scheduled as needed with treatment

## 2021-10-30 NOTE — Assessment & Plan Note (Addendum)
Improving with oral antibiotics per family.   Seems improved, s/p 5 days abx here Follow outpatient

## 2021-10-30 NOTE — Progress Notes (Signed)
Bilateral lower extremity venous duplex has been completed. Preliminary results can be found in CV Proc through chart review.  Results were given to Dr. Irene Limbo.  10/30/21 2:41 PM [name] RVT

## 2021-10-30 NOTE — Assessment & Plan Note (Addendum)
At presentation SPO2 as low as 53% on room air, requiring 3 L O2 via [place] on admission.  CXR shows increased left basilar pneumonia and/or effusion. Continue empiric antibiotics with IV ceftriaxone and azithromycin (2/7-2/11) Follow blood cultures (NGx5), sputum culture (not collected), strep pneumonia urinary antigen - negative CT without PE, but did have collapse/consolidative changes in lingula and LLL with attenuation of LLL airway.  Moderate L effusion and trace R effusion.   Now s/p thora, exudative effusion Follow culture and cytology outpatient with Dr. Irene Limbo Continues to intermittently require 1-2 L, but currently on 1 L

## 2021-10-30 NOTE — Patient Instructions (Signed)

## 2021-10-31 ENCOUNTER — Inpatient Hospital Stay: Payer: Medicare Other

## 2021-10-31 ENCOUNTER — Inpatient Hospital Stay: Payer: Medicare Other | Admitting: Hematology

## 2021-10-31 ENCOUNTER — Encounter: Payer: Self-pay | Admitting: Hematology

## 2021-10-31 DIAGNOSIS — D5912 Cold autoimmune hemolytic anemia: Secondary | ICD-10-CM | POA: Diagnosis present

## 2021-10-31 DIAGNOSIS — Z66 Do not resuscitate: Secondary | ICD-10-CM | POA: Diagnosis present

## 2021-10-31 DIAGNOSIS — E876 Hypokalemia: Secondary | ICD-10-CM | POA: Diagnosis not present

## 2021-10-31 DIAGNOSIS — J189 Pneumonia, unspecified organism: Secondary | ICD-10-CM | POA: Diagnosis present

## 2021-10-31 DIAGNOSIS — S72141D Displaced intertrochanteric fracture of right femur, subsequent encounter for closed fracture with routine healing: Secondary | ICD-10-CM | POA: Diagnosis not present

## 2021-10-31 DIAGNOSIS — J9601 Acute respiratory failure with hypoxia: Secondary | ICD-10-CM | POA: Diagnosis present

## 2021-10-31 DIAGNOSIS — Z803 Family history of malignant neoplasm of breast: Secondary | ICD-10-CM | POA: Diagnosis not present

## 2021-10-31 DIAGNOSIS — Z91013 Allergy to seafood: Secondary | ICD-10-CM | POA: Diagnosis not present

## 2021-10-31 DIAGNOSIS — S2232XD Fracture of one rib, left side, subsequent encounter for fracture with routine healing: Secondary | ICD-10-CM | POA: Diagnosis not present

## 2021-10-31 DIAGNOSIS — Z7401 Bed confinement status: Secondary | ICD-10-CM | POA: Diagnosis not present

## 2021-10-31 DIAGNOSIS — E43 Unspecified severe protein-calorie malnutrition: Secondary | ICD-10-CM | POA: Diagnosis present

## 2021-10-31 DIAGNOSIS — D472 Monoclonal gammopathy: Secondary | ICD-10-CM | POA: Diagnosis present

## 2021-10-31 DIAGNOSIS — F0390 Unspecified dementia without behavioral disturbance: Secondary | ICD-10-CM | POA: Diagnosis present

## 2021-10-31 DIAGNOSIS — Z993 Dependence on wheelchair: Secondary | ICD-10-CM | POA: Diagnosis not present

## 2021-10-31 DIAGNOSIS — J440 Chronic obstructive pulmonary disease with acute lower respiratory infection: Secondary | ICD-10-CM | POA: Diagnosis present

## 2021-10-31 DIAGNOSIS — Z681 Body mass index (BMI) 19 or less, adult: Secondary | ICD-10-CM | POA: Diagnosis not present

## 2021-10-31 DIAGNOSIS — L03115 Cellulitis of right lower limb: Secondary | ICD-10-CM | POA: Diagnosis present

## 2021-10-31 DIAGNOSIS — Z7982 Long term (current) use of aspirin: Secondary | ICD-10-CM | POA: Diagnosis not present

## 2021-10-31 DIAGNOSIS — E538 Deficiency of other specified B group vitamins: Secondary | ICD-10-CM | POA: Diagnosis present

## 2021-10-31 DIAGNOSIS — Z20822 Contact with and (suspected) exposure to covid-19: Secondary | ICD-10-CM | POA: Diagnosis present

## 2021-10-31 DIAGNOSIS — Z79899 Other long term (current) drug therapy: Secondary | ICD-10-CM | POA: Diagnosis not present

## 2021-10-31 DIAGNOSIS — K5641 Fecal impaction: Secondary | ICD-10-CM | POA: Diagnosis present

## 2021-10-31 DIAGNOSIS — Z87891 Personal history of nicotine dependence: Secondary | ICD-10-CM | POA: Diagnosis not present

## 2021-10-31 LAB — TYPE AND SCREEN
ABO/RH(D): B POS
Antibody Screen: POSITIVE
Unit division: 0

## 2021-10-31 LAB — BASIC METABOLIC PANEL
Anion gap: 9 (ref 5–15)
BUN: 20 mg/dL (ref 8–23)
CO2: 28 mmol/L (ref 22–32)
Calcium: 7.4 mg/dL — ABNORMAL LOW (ref 8.9–10.3)
Chloride: 98 mmol/L (ref 98–111)
Creatinine, Ser: 0.39 mg/dL — ABNORMAL LOW (ref 0.44–1.00)
GFR, Estimated: >60 mL/min (ref >60)
Glucose, Bld: 84 mg/dL (ref 70–99)
Potassium: 3.4 mmol/L — ABNORMAL LOW (ref 3.5–5.1)
Sodium: 135 mmol/L (ref 135–145)

## 2021-10-31 LAB — BPAM RBC
Blood Product Expiration Date: 202303112359
ISSUE DATE / TIME: 202302071103
Unit Type and Rh: 5100

## 2021-10-31 LAB — CBC
HCT: 27.1 % — ABNORMAL LOW (ref 36.0–46.0)
Hemoglobin: 9.1 g/dL — ABNORMAL LOW (ref 12.0–15.0)
MCH: 31 pg (ref 26.0–34.0)
MCHC: 33.6 g/dL (ref 30.0–36.0)
MCV: 92.2 fL (ref 80.0–100.0)
Platelets: 419 10*3/uL — ABNORMAL HIGH (ref 150–400)
RBC: 2.94 MIL/uL — ABNORMAL LOW (ref 3.87–5.11)
RDW: 18.8 % — ABNORMAL HIGH (ref 11.5–15.5)
WBC: 5.5 10*3/uL (ref 4.0–10.5)
nRBC: 0 % (ref 0.0–0.2)

## 2021-10-31 LAB — STREP PNEUMONIAE URINARY ANTIGEN: Strep Pneumo Urinary Antigen: NEGATIVE

## 2021-10-31 MED ORDER — ENSURE ENLIVE PO LIQD
237.0000 mL | Freq: Two times a day (BID) | ORAL | Status: DC
  Administered 2021-10-31 – 2021-11-06 (×5): 237 mL via ORAL

## 2021-10-31 NOTE — ED Notes (Signed)
Pt calling out for help, pt stating that she doesn't feel right. Pt is able to tell staff what is wrong. To states "I feel woozy and have pain in my stomach." States she might vomit, given PRN zofran

## 2021-10-31 NOTE — ED Notes (Signed)
Attempted to titrate off O2 pt started to destat, returned to 3L [place]  Pt denies increased shortness of breath when O2 was lowered.

## 2021-10-31 NOTE — Progress Notes (Addendum)
.. HEMATOLOGY ONCOLOGY PROGRESS NOTE  Date of service: .10/25/2021   Patient Care Team: [name], MD as PCP - General (Internal Medicine) Brunetta Genera, MD as Consulting Physician (Hematology)   Chief Complaint: Follow-up for continued management of cold agglutinin related hemolytic anemia INTERVAL HISTORY:   [name] is here for continued follow-up of her cold agglutinin related autoimmune hemolytic anemia.  She is still at skilled nursing facility undergoing physical therapy for her fractured hip. She notes bilateral lower extremity redness swelling and warmth concerning for cellulitis. She was started on Augmentin and doxycycline. Labs from 10/23/2021 still showed symptomatic anemia with a hemoglobin of 6.3.  She is needing regular PRBC transfusions. Patient would like to continue with Rituxan symptomatic cold agglutinin disease. We discussed goals of care.   REVIEW OF SYSTEMS:   10 Point review of Systems was done is negative except as noted above.   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 Past Surgical History:  Procedure Laterality Date   ABDOMINAL HYSTERECTOMY  1986   APPENDECTOMY  1967   BREAST EXCISIONAL BIOPSY Right 1984   CATARACT EXTRACTION W/ INTRAOCULAR LENS IMPLANT Right 1990's   congential cataract   CYSTOSCOPY W/ URETERAL STENT PLACEMENT  11/12/2015   Procedure: CYSTOSCOPY WITH RETROGRADE PYELOGRAM/URETERAL STENT PLACEMENT;  Surgeon: [name], MD;  Location: [facility];  Service:  Urology;;   CYSTOSCOPY W/ URETERAL STENT REMOVAL Left 11/22/2015   Procedure: CYSTOSCOPY WITH STENT REMOVAL;  Surgeon: [name], MD;  Location[facility];  Service: Urology;  Laterality: Left;   CYSTOSCOPY/URETEROSCOPY/HOLMIUM LASER Left 11/22/2015   Procedure: CYSTOSCOPY/URETEROSCOPY/HOLMIUM LASER;  Surgeon: [name], MD;  Location: [facility];  Service: Urology;  Laterality: Left;  FLEX URETEROSCOPY   FEMUR IM NAIL Left 08/04/2015   Procedure: INTRAMEDULLARY (IM) NAIL FEMORAL;  Surgeon: [name], MD;  Location: [facility];  Service: Orthopedics;  Laterality: Left;   INTRAMEDULLARY (IM) NAIL INTERTROCHANTERIC Right 10/04/2021   Procedure: INTRAMEDULLARY (IM) NAIL INTERTROCHANTRIC;  Surgeon: [name], MD;  Location: [facility];  Service: Orthopedics;  Laterality: Right;   IR KYPHO THORACIC WITH BONE BIOPSY  06/22/2018   TONSILLECTOMY  age 81    . Social History   Tobacco Use   Smoking status: Former    Packs/day: 1.00    Types: Cigarettes    Quit date: 08/03/2015    Years since quitting: 6.2   Smokeless tobacco: Never  Vaping Use   Vaping Use: Never used  Substance Use Topics   Alcohol use: Yes    Comment: occ   Drug use: Never    ALLERGIES:  is allergic to other and shellfish allergy.  MEDICATIONS:  Current Outpatient Medications  Medication Sig Dispense Refill   acetaminophen (TYLENOL) 500 MG tablet Take 500 mg by mouth every 6 (six) hours as needed for moderate pain. (Patient not taking: Reported on 10/31/2021)     aspirin EC 325 MG tablet  Take 1 tablet (325 mg total) by mouth daily. X 5 weeks for DVT prophylaxis (Patient taking differently: Take 325 mg by mouth daily. For DVT Prophylaxis) 30 tablet 0   docusate sodium (COLACE) 100 MG capsule Take 1 capsule (100 mg total) by mouth 2 (two) times daily. 10 capsule 0   folic acid (FOLVITE) 1 MG tablet Take 2 tablets (2 mg total) by mouth daily.     loratadine (CLARITIN) 10 MG tablet Take 10  mg by mouth daily as needed for allergies. (Patient not taking: Reported on 10/31/2021)     Multiple Vitamin (MULTIVITAMIN WITH MINERALS) TABS tablet Take 1 tablet by mouth daily.     OXYGEN Inhale 2 L into the lungs See admin instructions. To maintain SAT greater than 90%     pantoprazole (PROTONIX) 40 MG tablet Take 1 tablet (40 mg total) by mouth daily at 6 (six) AM.     polyethylene glycol (MIRALAX) 17 g packet Take 17 g by mouth daily. 14 each 0   PRESCRIPTION MEDICATION Take 90 mLs by mouth daily. Med Pass 2.0     QUEtiapine (SEROQUEL) 25 MG tablet Take 1 tablet (25 mg total) by mouth at bedtime. 20 tablet 0   sertraline (ZOLOFT) 25 MG tablet Take 25 mg by mouth daily.     vitamin B-12 2000 MCG tablet Take 1 tablet (2,000 mcg total) by mouth daily. 30 tablet 2   No current facility-administered medications for this visit.    PHYSICAL EXAMINATION: ECOG PERFORMANCE STATUS: 3 NAD GENERAL:alert, in no acute distress and comfortable SKIN: no acute rashes, no significant lesions EYES: conjunctiva are pink and non-injected, sclera anicteric OROPHARYNX: MMM, no exudates, no oropharyngeal erythema or ulceration NECK: supple, no JVD LYMPH:  no palpable lymphadenopathy in the cervical, axillary or inguinal regions LUNGS: clear to auscultation b/l with normal respiratory effort HEART: regular rate & rhythm ABDOMEN:  normoactive bowel sounds , non tender, not distended. Extremity: Redness swelling and warmth bilateral lower extremity PSYCH: alert & oriented x 3 with fluent speech NEURO: no focal motor/sensory deficits   LABORATORY DATA:  Labs results reviewed          RADIOGRAPHIC STUDIES: I have personally reviewed the radiological images as listed and agreed with the findings in the report.  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 Her hemoglobin tends to be lower during winter and has been in the 6-8 range. Overall stable hemoglobin levels  in the 6-7 range with folic acid replacement and cold avoidance.  #2 IgM lambda MGUS - cannot rule out possibility of lymphoplasmacytic lymphoma though less likely given chronicity of her condition. Flow cytometry- no clonal B cell lymphoma   #3 History of previous B12 deficiency  #4 Low back pain -compression fracture L4 -MRI L spine -Acute/subacute superior endplate fracture at L4 with loss of height of 10-20%. No retropulsed bone. This is quite likely the cause of the low back pain. This was probably present on the radiographs of 01/20/2018, but not diagnosable because of the spinal curvature and unlevel endplates. Old healed superior endplate fracture at F16  #5 Spiculated Lung nodule in right upper lobe  First seen on 05/28/18 CTA Chest Hasn't changed over 6 month interval with repeat CT Chest in February 2020  10/26/18 CT Chest which revealed There is a spiculated solid nodule of the right upper lobe, measuring 5-6 mm and stable from prior examination. Recommend additional follow-up in 12 months to ensure ongoing stability. 2.  Emphysema. 3. Increased height loss deformities of T11 and T12.  08/13/2019 CT Chest ([id number]) revealed "4 mm posterior right upper lobe pulmonary nodule, unchanged. Dedicated follow-up imaging is not required per Fleischner Society guidelines. This recommendation follows the consensus statement Guidelines for Management of Small Pulmonary Nodules Detected on CT Images: From the Fleischner Society 2017; Radiology 2017; 284:228-243. Aortic Atherosclerosis (ICD10-I70.0) and Emphysema (ICD10-J43.9)."  #7 Vitamin D deficiency -continue Vit D  #8 Patient Active Problem List   Diagnosis Date Noted   Pulmonary nodule 11/06/2021   Exudative pleural effusion 11/05/2021   Fecal impaction ([place]) 11/05/2021   Abnormal CT scan 11/05/2021   Pneumonia 10/31/2021   Acute respiratory failure with hypoxia ([place]) 10/30/2021   Cellulitis of right lower extremity 10/30/2021    Left 9th rib fracture 10/05/2021   Dementia ([place]) 10/03/2021   Intraventricular hemorrhage ([place]) 10/03/2021   Hip fracture ([place]) 10/02/2021   CAP (community acquired pneumonia) 08/10/2021   Sepsis due to undetermined organism ([place]) 08/09/2021   Aortic atherosclerosis ([place]) 08/09/2021   COPD (chronic obstructive pulmonary disease) ([facility])    Elevated transaminase level    Symptomatic anemia 05/28/2018   Autoimmune hemolytic anemia ([place]) 02/25/2018   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 ([place]) 02/04/2018   Occult blood in stools 02/04/2018   Ureteral calculus 11/12/2015   Breast cancer screening, high risk patient 08/29/2015   Acute delirium 08/07/2015   Postoperative anemia due to acute blood loss 08/06/2015   Protein-calorie malnutrition, severe 08/04/2015   Closed intertrochanteric fracture of left femur ([place]) 08/03/2015   Fall 08/03/2015   Closed right hip fracture ([place]) 08/03/2015   Hyperbilirubinemia 08/01/2013   Hypokalemia 07/30/2013   Vitamin B 12 deficiency 08/07/2012   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   PLAN: -Recent labs reviewed with the patient and her sister. -Bilateral lower extremity cellulitis prescribed Augmentin plus doxycycline. -Ultrasound-guided lower extremity venous evaluation to rule out DVT-patient is only on aspirin after her recent hip surgery due to intracranial bleed. -Patient would like to proceed with rituximab to treat her symptomatic anemia from cold agglutinin disease.  FOLLOW UP:  Follow-up as per schedule appointments for transfusions last dose of Rituxan Ultrasound today to rule out DVT   Sullivan Lone MD Elaine AAHIVMS [facility] Hematology/Oncology Physician [facility].Marland KitchenMarland KitchenMarland Kitchen

## 2021-10-31 NOTE — Progress Notes (Signed)
PROGRESS NOTE    [name]  TDD:[id number] DOB: Oct 27, 1940 DOA: 10/30/2021 PCP: [name], MD  Chief Complaint  Patient presents with   Respiratory Distress    Brief Narrative:  CHAE SHUSTER is [name] 81 y.o. 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  CXR shows increased left basilar pneumonia and/or effusion. Continue empiric antibiotics with IV ceftriaxone and azithromycin Follow blood cultures, sputum culture, strep pneumonia urinary antigen Continue supplemental oxygen as needed and wean as tolerated No escalation to BiPAP per family wishes  Cellulitis of right lower extremity- (present on admission) Improving with oral antibiotics per family.  She is on IV ceftriaxone as above.  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 [facility])- (present on admission) Follows with hematology, Dr. Irene Limbo.  Has been receiving Rituxan and frequent PRBC transfusions.  Received 1 unit PRBC at cancer center day of admission on 10/30/2021. Hemoglobin 10.8 on admission 9.1 today, will trend Repeat CBC in Cheresa Siers.m.  Dementia ([place])- (present on admission) Chronic dementia at baseline.  Continue Seroquel and delirium precautions.   DVT prophylaxis: scd Code Status: dnr Family  Communication: none Disposition:   Status is: Observation The patient will require care spanning > 2 midnights and should be moved to inpatient because: need for IV abx, O2 need, etc         Consultants:  oncology  Procedures:  none  Antimicrobials:  Anti-infectives (From admission, onward)    Start     Dose/Rate Route Frequency Ordered Stop   10/31/21 1800  azithromycin (ZITHROMAX) 500 mg in sodium chloride 0.9 % 250 mL IVPB        500 mg 250 mL/hr over 60 Minutes Intravenous Every 24 hours 10/30/21 2015 11/04/21 1759   10/31/21 1700  cefTRIAXone (ROCEPHIN) 2 g in sodium chloride 0.9 % 100 mL IVPB        2 g 200 mL/hr over 30 Minutes Intravenous Every 24 hours 10/30/21 2015 11/04/21 1659   10/30/21 1600  cefTRIAXone (ROCEPHIN) 1 g in sodium chloride 0.9 % 100 mL IVPB        1 g 200 mL/hr over 30 Minutes Intravenous  Once 10/30/21 1548 10/30/21 1845   10/30/21 1600  azithromycin (ZITHROMAX) 500 mg in sodium chloride 0.9 % 250 mL IVPB        500 mg 250 mL/hr over 60 Minutes Intravenous  Once 10/30/21 1548 10/30/21 1858       Subjective: Sleeping, doesn't want to engage due to this  Objective: Vitals:   10/31/21 0800 10/31/21 0900 10/31/21 1000 10/31/21 1100  BP: (!) 109/42 (!) 101/52 (!) 118/45 (!) 110/38  Pulse: (!) 53 (!) 50 (!) 50 (!) 59  Resp: 12 15 15 20   Temp:      TempSrc:      SpO2: 100% 98% 100% 94%  Weight:      Height:        Intake/Output Summary (Last 24 hours) at 10/31/2021 1322 Last data filed at 10/30/2021 1858 Gross per 24 hour  Intake 350 ml  Output --  Net 350 ml   Filed Weights   10/30/21 1535  Weight: 42.6 kg    Examination:  General exam: Appears calm and comfortable  Respiratory system: Clear to auscultation. Respiratory effort normal. Cardiovascular system: RRR Gastrointestinal system: Abdomen is nondistended, soft and nontender.  Central nervous system: sleeping, says she's sleeping and wants to be left alone, limited mental  status assessment due to this Extremities: no LEE Skin: bilateral LE erythema, doesn't allow me to remove socks    Data Reviewed: I have personally reviewed following labs and imaging studies  CBC: Recent Labs  Lab 10/29/21 0921 10/30/21 1645 10/31/21 0535  WBC 10.2 5.0 5.5  NEUTROABS 8.3*  --   --   HGB 7.5* 10.8* 9.1*  HCT 23.1* 32.0* 27.1*  MCV 90.9 93.6 92.2  PLT 413* 410* 419*    Basic Metabolic Panel: Recent Labs  Lab 10/29/21 0921 10/30/21 1510 10/31/21 0535  NA 136 135 135  K 4.2 4.4 3.4*  CL 99 99 98  CO2 29 27 28   GLUCOSE 97 95 84  BUN 15 18 20   CREATININE 0.49 0.57 0.39*  CALCIUM 7.9* 7.5* 7.4*    GFR: Estimated Creatinine Clearance: 37.7 mL/min (Natasha Paulson) (by C-G formula based on SCr of 0.39 mg/dL (L)).  Liver Function Tests: Recent Labs  Lab 10/29/21 0921 10/30/21 1510  AST 50* 51*  ALT 14 18  ALKPHOS 195* 174*  BILITOT 3.1* 2.9*  PROT 6.0* 6.0*  ALBUMIN 3.5 3.2*    CBG: No results for input(s): GLUCAP in the last 168 hours.   Recent Results (from the past 240 hour(s))  Blood culture (routine x 2)     Status: None (Preliminary result)   Collection Time: 10/30/21  4:32 PM   Specimen: BLOOD RIGHT FOREARM  Result Value Ref Range Status   Specimen Description   Final    BLOOD RIGHT FOREARM Performed at [facility] [address], [place], [place] [address]    Special Requests   Final    BOTTLES DRAWN AEROBIC AND ANAEROBIC Blood Culture adequate volume Performed at [facility] [address] [place], [place] [address]    Culture   Final    NO GROWTH < 12 HOURS Performed at [facility] [address], [place], [place] [address]    Report Status PENDING  Incomplete  Blood culture (routine x 2)     Status: None (Preliminary result)   Collection Time: 10/30/21  4:45 PM   Specimen: BLOOD  Result Value Ref Range Status   Specimen Description   Final    BLOOD RIGHT ANTECUBITAL Performed at  [facility] [address] [place], [place] [address]    Special Requests   Final    BOTTLES DRAWN AEROBIC AND ANAEROBIC Blood Culture results may not be optimal due to an inadequate volume of blood received in culture bottles Performed at [facility] [address], [place], [place] [address]    Culture   Final    NO GROWTH < 12 HOURS Performed at [facility] [address] [place], [place] [address]    Report Status PENDING  Incomplete  Resp Panel by RT-PCR (Flu Hadyn Blanck&B, Covid) Nasopharyngeal Swab  Status: None   Collection Time: 10/30/21  5:02 PM   Specimen: Nasopharyngeal Swab; Nasopharyngeal(NP) swabs in vial transport medium  Result Value Ref Range Status   SARS Coronavirus 2 by RT PCR NEGATIVE NEGATIVE Final    Comment: (NOTE) SARS-CoV-2 target nucleic acids are NOT DETECTED.  The SARS-CoV-2 RNA is generally detectable in upper respiratory specimens during the acute phase of infection. The lowest concentration of SARS-CoV-2 viral copies this assay can detect is 138 copies/mL. Sharion Grieves negative result does not preclude SARS-Cov-2 infection and should not be used as the sole basis for treatment or other patient management decisions. Librado Guandique negative result may occur with  improper specimen collection/handling, submission of specimen other than nasopharyngeal swab, presence of viral mutation(s) within the areas targeted by this assay, and inadequate number of viral copies(<138 copies/mL). Macaila Tahir negative result must be combined with clinical observations, patient history, and epidemiological information. The expected result is Negative.  Fact Sheet for Patients:  EntrepreneurPulse.com.au  Fact Sheet for Healthcare Providers:  IncredibleEmployment.be  This test is no t yet approved or cleared by the [place] FDA and  has been authorized for detection and/or diagnosis of SARS-CoV-2 by FDA under an  Emergency Use Authorization (EUA). This EUA will remain  in effect (meaning this test can be used) for the duration of the COVID-19 declaration under Section 564(b)(1) of the Act, 21 U.S.C.section 360bbb-3(b)(1), unless the authorization is terminated  or revoked sooner.       Influenza Rasaan Brotherton by PCR NEGATIVE NEGATIVE Final   Influenza B by PCR NEGATIVE NEGATIVE Final    Comment: (NOTE) The Xpert Xpress SARS-CoV-2/FLU/RSV plus assay is intended as an aid in the diagnosis of influenza from Nasopharyngeal swab specimens and should not be used as Levia Waltermire sole basis for treatment. Nasal washings and aspirates are unacceptable for Xpert Xpress SARS-CoV-2/FLU/RSV testing.  Fact Sheet for Patients: EntrepreneurPulse.com.au  Fact Sheet for Healthcare Providers: IncredibleEmployment.be  This test is not yet approved or cleared by the [place] FDA and has been authorized for detection and/or diagnosis of SARS-CoV-2 by FDA under an Emergency Use Authorization (EUA). This EUA will remain in effect (meaning this test can be used) for the duration of the COVID-19 declaration under Section 564(b)(1) of the Act, 21 U.S.C. section 360bbb-3(b)(1), unless the authorization is terminated or revoked.  Performed at [facility], [place] [address], [place], [place] [address]          Radiology Studies: DG Chest Port 1 View  Result Date: 10/30/2021 CLINICAL DATA:  Short of breath, respiratory distress EXAM: PORTABLE CHEST 1 VIEW COMPARISON:  10/03/2021 FINDINGS: Single frontal view of the chest was obtained with the patient rotated markedly toward the left. The cardiac silhouette is stable. There is chronic elevation of the left hemidiaphragm, with increased left basilar consolidation and effusion since prior exam. Minimal hypoventilatory changes at the right base. No pneumothorax. No acute bony abnormalities. IMPRESSION: 1. Progressive left basilar  consolidation and/or effusion. Electronically Signed   By: Randa Ngo M.D.   On: 10/30/2021 15:33   VAS [place] LOWER EXTREMITY VENOUS (DVT)  Result Date: 10/30/2021  Lower Venous DVT Study Patient Name:  ZANI KYLLONEN  Date of Exam:   10/30/2021 Medical Rec #: [id number]             Accession #:    [id number] Date of Birth: 1940/10/25            Patient Gender: F Patient Age:   42 years Exam  Location:  [facility] Procedure:      VAS [place] LOWER EXTREMITY VENOUS (DVT) Referring Phys: Sullivan Lone --------------------------------------------------------------------------------  Indications: Swelling.  Risk Factors: Cancer. Limitations: Poor ultrasound/tissue interface. Comparison Study: No prior studies. Performing Technologist: Oliver Hum RVT  Examination Guidelines: Srihan Brutus complete evaluation includes B-mode imaging, spectral Doppler, color Doppler, and power Doppler as needed of all accessible portions of each vessel. Bilateral testing is considered an integral part of Kyleigha Markert complete examination. Limited examinations for reoccurring indications may be performed as noted. The reflux portion of the exam is performed with the patient in reverse Trendelenburg.  +---------+---------------+---------+-----------+----------+--------------+  RIGHT     Compressibility Phasicity Spontaneity Properties Thrombus Aging  +---------+---------------+---------+-----------+----------+--------------+  CFV       Full            Yes       Yes                                    +---------+---------------+---------+-----------+----------+--------------+  SFJ       Full                                                             +---------+---------------+---------+-----------+----------+--------------+  FV Prox   Full                                                             +---------+---------------+---------+-----------+----------+--------------+  FV Mid    Full                                                              +---------+---------------+---------+-----------+----------+--------------+  FV Distal Full                                                             +---------+---------------+---------+-----------+----------+--------------+  PFV       Full                                                             +---------+---------------+---------+-----------+----------+--------------+  POP       Full            Yes       Yes                                    +---------+---------------+---------+-----------+----------+--------------+  PTV       Full                                                             +---------+---------------+---------+-----------+----------+--------------+  PERO      Full                                                             +---------+---------------+---------+-----------+----------+--------------+   +---------+---------------+---------+-----------+----------+--------------+  LEFT      Compressibility Phasicity Spontaneity Properties Thrombus Aging  +---------+---------------+---------+-----------+----------+--------------+  CFV       Full            Yes       Yes                                    +---------+---------------+---------+-----------+----------+--------------+  SFJ       Full                                                             +---------+---------------+---------+-----------+----------+--------------+  FV Prox   Full                                                             +---------+---------------+---------+-----------+----------+--------------+  FV Mid    Full                                                             +---------+---------------+---------+-----------+----------+--------------+  FV Distal Full                                                             +---------+---------------+---------+-----------+----------+--------------+  PFV       Full                                                              +---------+---------------+---------+-----------+----------+--------------+  POP       Full            Yes       Yes                                    +---------+---------------+---------+-----------+----------+--------------+  PTV       Full                                                             +---------+---------------+---------+-----------+----------+--------------+  PERO      Full                                                             +---------+---------------+---------+-----------+----------+--------------+    Summary: RIGHT: - There is no evidence of deep vein thrombosis in the lower extremity. However, portions of this examination were limited- see technologist comments above.  - No cystic structure found in the popliteal fossa.  LEFT: - There is no evidence of deep vein thrombosis in the lower extremity.  - No cystic structure found in the popliteal fossa.  *See table(s) above for measurements and observations.    Preliminary         Scheduled Meds:  aspirin EC  325 mg Oral Daily   docusate sodium  100 mg Oral BID   folic acid  2 mg Oral Daily   multivitamin with minerals  1 tablet Oral Daily   pantoprazole  40 mg Oral Daily   QUEtiapine  25 mg Oral QHS   cyanocobalamin  2,000 mcg Oral Daily   Continuous Infusions:  azithromycin     cefTRIAXone (ROCEPHIN)  IV       LOS: 0 days    Time spent: over 30 min    Fayrene Helper, MD [facility]   To contact the attending provider between 7A-7P or the covering provider during after hours 7P-7A, please log into the web site [email or web address] and access using universal [place] password for that web site. If you do not have the password, please call the hospital operator.  10/31/2021, 1:22 PM

## 2021-11-01 DIAGNOSIS — J9601 Acute respiratory failure with hypoxia: Secondary | ICD-10-CM | POA: Diagnosis not present

## 2021-11-01 LAB — CBC WITH DIFFERENTIAL/PLATELET
Abs Immature Granulocytes: 0.16 10*3/uL — ABNORMAL HIGH (ref 0.00–0.07)
Basophils Absolute: 0.1 10*3/uL (ref 0.0–0.1)
Basophils Relative: 1 %
Eosinophils Absolute: 0 10*3/uL (ref 0.0–0.5)
Eosinophils Relative: 0 %
HCT: 27.3 % — ABNORMAL LOW (ref 36.0–46.0)
Hemoglobin: 9.1 g/dL — ABNORMAL LOW (ref 12.0–15.0)
Immature Granulocytes: 2 %
Lymphocytes Relative: 21 %
Lymphs Abs: 1.9 10*3/uL (ref 0.7–4.0)
MCH: 30.8 pg (ref 26.0–34.0)
MCHC: 33.3 g/dL (ref 30.0–36.0)
MCV: 92.5 fL (ref 80.0–100.0)
Monocytes Absolute: 0.9 10*3/uL (ref 0.1–1.0)
Monocytes Relative: 10 %
Neutro Abs: 5.8 10*3/uL (ref 1.7–7.7)
Neutrophils Relative %: 66 %
Platelets: 501 10*3/uL — ABNORMAL HIGH (ref 150–400)
RBC: 2.95 MIL/uL — ABNORMAL LOW (ref 3.87–5.11)
RDW: 19.7 % — ABNORMAL HIGH (ref 11.5–15.5)
WBC: 8.8 10*3/uL (ref 4.0–10.5)
nRBC: 0.2 % (ref 0.0–0.2)

## 2021-11-01 LAB — COMPREHENSIVE METABOLIC PANEL
ALT: 12 U/L (ref 0–44)
AST: 26 U/L (ref 15–41)
Albumin: 2.7 g/dL — ABNORMAL LOW (ref 3.5–5.0)
Alkaline Phosphatase: 140 U/L — ABNORMAL HIGH (ref 38–126)
Anion gap: 6 (ref 5–15)
BUN: 14 mg/dL (ref 8–23)
CO2: 31 mmol/L (ref 22–32)
Calcium: 7.4 mg/dL — ABNORMAL LOW (ref 8.9–10.3)
Chloride: 99 mmol/L (ref 98–111)
Creatinine, Ser: 0.46 mg/dL (ref 0.44–1.00)
GFR, Estimated: >60 mL/min (ref >60)
Glucose, Bld: 77 mg/dL (ref 70–99)
Potassium: 3.2 mmol/L — ABNORMAL LOW (ref 3.5–5.1)
Sodium: 136 mmol/L (ref 135–145)
Total Bilirubin: 0.7 mg/dL (ref 0.3–1.2)
Total Protein: 5 g/dL — ABNORMAL LOW (ref 6.5–8.1)

## 2021-11-01 LAB — PHOSPHORUS: Phosphorus: 2.4 mg/dL — ABNORMAL LOW (ref 2.5–4.6)

## 2021-11-01 LAB — MAGNESIUM: Magnesium: 1.4 mg/dL — ABNORMAL LOW (ref 1.7–2.4)

## 2021-11-01 MED ORDER — POTASSIUM CHLORIDE CRYS ER 20 MEQ PO TBCR
40.0000 meq | EXTENDED_RELEASE_TABLET | ORAL | Status: AC
  Administered 2021-11-01 (×2): 40 meq via ORAL
  Filled 2021-11-01 (×2): qty 2

## 2021-11-01 MED ORDER — MAGNESIUM SULFATE 4 GM/100ML IV SOLN
4.0000 g | Freq: Once | INTRAVENOUS | Status: AC
  Administered 2021-11-01: 4 g via INTRAVENOUS
  Filled 2021-11-01: qty 100

## 2021-11-01 NOTE — Progress Notes (Signed)
Initial Nutrition Assessment  DOCUMENTATION CODES:   Severe malnutrition in context of chronic illness, Underweight  INTERVENTION:   -Ensure Plus High Protein po BID, each supplement provides 350 kcal and 20 grams of protein.   -Multivitamin with minerals daily  NUTRITION DIAGNOSIS:   Severe Malnutrition related to chronic illness (dementia) as evidenced by severe fat depletion, severe muscle depletion.  GOAL:   Patient will meet greater than or equal to 90% of their needs  MONITOR:   PO intake, Supplement acceptance, Labs, Weight trends, I & O's, Skin  REASON FOR ASSESSMENT:   Malnutrition Screening Tool    ASSESSMENT:   81 y.o. 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  Patient in room, no visitors at bedside. Pt pleasantly confused, attempted to answer RD's questions but answers were not appropriate. Pt states she ate breakfast this morning but didn't want her banana (offered to RD).  Ensure in cup at bedside. Seemed about half consumed.  Pt was seen by a RD prior to her hip surgery on 10/03/21. Diagnosed with severe malnutrition at that time. Pt then followed up with [place] RD on 2/7. Caregiver at that time reported  pt has a good appetite and eats 3 meals a day typically.  Will continue Ensure supplements for additional kcals and protein.   Per weight records, no new weight measurements since 1/12. Weighed 94 lbs on 09/20/21. On 1/12, weighed 93 lbs.  Medications: Colace, Folic acid, Multivitamin with minerals daily, KLOR-CON, Vitamin B-12, IV Mg sulfate  Labs reviewed:  Low K, Mg, Phos  NUTRITION - FOCUSED PHYSICAL EXAM:  Flowsheet Row Most Recent Value   Orbital Region Mild depletion  Upper Arm Region Severe depletion  Thoracic and Lumbar Region Severe depletion  Buccal Region Moderate depletion  Temple Region Severe depletion  Clavicle Bone Region Severe depletion  Clavicle and Acromion Bone Region Severe depletion  Scapular Bone Region Severe depletion  Dorsal Hand Severe depletion  Patellar Region Severe depletion  Anterior Thigh Region Severe depletion  Posterior Calf Region Severe depletion  Edema (RD Assessment) None  Hair Reviewed  [thinning]  Eyes Reviewed  Mouth Reviewed  Skin Reviewed  Nails Reviewed  [brittle]       Diet Order:   Diet Order             Diet regular Room service appropriate? Yes; Fluid consistency: Thin  Diet effective now                   EDUCATION NEEDS:   No education needs have been identified at this time  Skin:  Skin Assessment: Skin Integrity Issues: Skin Integrity Issues:: Incisions Incisions: 1/12 right hip  Last BM:  2/ 9-type 5  Height:   Ht Readings from Last 1 Encounters:  10/30/21 5\' 1"  (1.549 m)    Weight:   Wt Readings from Last 1 Encounters:  10/30/21 42.6 kg    BMI:  Body mass index is 17.75 kg/m.  Estimated Nutritional Needs:   Kcal:  1450-1650  Protein:  70-85g  Fluid:  1.6L/day  Clayton Bibles, MS, RD, LDN Inpatient Clinical Dietitian Contact information available via Amion

## 2021-11-01 NOTE — TOC Initial Note (Signed)
Transition of Care [facility]) - Initial/Assessment Note   Patient Details  Name: [name] MRN: [id number] Date of Birth: 1940-12-01  Transition of Care [facility]) CM/SW Contact:    Sherie Don, LCSW Phone Number: 11/01/2021, 2:05 PM  Clinical Narrative: [facility] consulted as patient came from a facility. CSW spoke with patient's sister, [name], as patient has dementia and is only oriented to self. Per sister, the patient went to [facility] on 10/09/21 for rehab, but is now out of rehab days so the patient is private paying to live there. The plan is for the patient to return.  CSW confirmed with admissions at [facility] that the patient can return when medically ready and sister will need to sign readmit paperwork. TOC to follow.  Expected Discharge Plan: Skilled Nursing Facility Barriers to Discharge: Continued Medical Work up  Patient Goals and CMS Choice Patient states their goals for this hospitalization and ongoing recovery are:: Return to [employer] Medicare.gov Compare Post Acute Care list provided to:: Patient Represenative (must comment) [name] (sister)) Choice offered to / list presented to : Sibling  Expected Discharge Plan and Services Expected Discharge Plan: [place] In-house Referral: Clinical Social Work Living arrangements for the past 2 months: [place], [place]             DME Arranged: N/A DME Agency: NA  Prior Living Arrangements/Services Living arrangements for the past 2 months: [place], [place] Patient language and need for interpreter reviewed:: Yes Do you feel safe going back to the place where you live?: Yes      Need for Family Participation in Patient Care: Yes (Comment) (Patient has dementia and is oriented to self only.) Care giver support system in place?: Yes (comment) Criminal Activity/Legal Involvement Pertinent to Current Situation/Hospitalization: No - Comment  as needed  Activities of Daily Living Home Assistive Devices/Equipment: None ADL Screening (condition at time of admission) Patient's cognitive ability adequate to safely complete daily activities?: No Is the patient deaf or have difficulty hearing?: No Does the patient have difficulty seeing, even when wearing glasses/contacts?: No Does the patient have difficulty concentrating, remembering, or making decisions?: Yes Patient able to express need for assistance with ADLs?: No Does the patient have difficulty dressing or bathing?: Yes Independently performs ADLs?: No Communication: Independent Dressing (OT): Needs assistance Is this a change from baseline?: Pre-admission baseline Grooming: Needs assistance Is this a change from baseline?: Pre-admission baseline Feeding: Independent Bathing: Needs assistance Is this a change from baseline?: Pre-admission baseline Toileting: Needs assistance Is this a change from baseline?: Pre-admission baseline In/Out Bed: Needs assistance Is this a change from baseline?: Pre-admission baseline Walks in Home: Needs assistance Is this a change from baseline?: Pre-admission baseline Does the patient have difficulty walking or climbing stairs?: Yes Weakness of Legs: Both Weakness of Arms/Hands: Both  Permission Sought/Granted Permission sought to share information with : Facility Art therapist granted to share information with : Yes, Verbal Permission Granted Permission granted to share info w AGENCY: [facility]  Emotional Assessment Attitude/Demeanor/Rapport: Unable to Assess Affect (typically observed): Unable to Assess Orientation: : Oriented to Self Alcohol / Substance Use: Not Applicable Psych Involvement: No (comment)  Admission diagnosis:  Pneumonia [J18.9] Respiratory distress [R06.03] Hypoxia [R09.02] Acute respiratory failure with hypoxia (HCC) [J96.01] Acute respiratory failure, unspecified whether with  hypoxia or hypercapnia ([place]) [J96.00] Community acquired pneumonia, unspecified laterality [J18.9] Patient Active Problem List   Diagnosis Date Noted   Pneumonia 10/31/2021  Acute respiratory failure with hypoxia (HCC) 10/30/2021   Cellulitis of right lower extremity 10/30/2021   Left 9th rib fracture 10/05/2021   Dementia ([place]) 10/03/2021   Intraventricular hemorrhage ([place]) 10/03/2021   Hip fracture ([place]) 10/02/2021   CAP (community acquired pneumonia) 08/10/2021   Sepsis due to undetermined organism ([place]) 08/09/2021   Aortic atherosclerosis ([place]) 08/09/2021   COPD (chronic obstructive pulmonary disease) (HCC)    Elevated transaminase level    Symptomatic anemia 05/28/2018   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 ([place]) 02/04/2018   Occult blood in stools 02/04/2018   Ureteral calculus 11/12/2015   Breast cancer screening, high risk patient 08/29/2015   Acute delirium 08/07/2015   Postoperative anemia due to acute blood loss 08/06/2015   Protein-calorie malnutrition, severe 08/04/2015   Closed intertrochanteric fracture of left femur ([place]) 08/03/2015   Fall 08/03/2015   Closed right hip fracture ([place]) 08/03/2015   Hyperbilirubinemia 08/01/2013   Hypokalemia 07/30/2013   Vitamin B 12 deficiency 08/07/2012   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

## 2021-11-01 NOTE — Progress Notes (Signed)
PROGRESS NOTE    [name]  JIR:[id number] DOB: 17-Aug-1941 DOA: 10/30/2021 PCP: [name], MD  Chief Complaint  Patient presents with   Respiratory Distress    Brief Narrative:  [name] is [name] 81 y.o. 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  CXR shows increased left basilar pneumonia and/or effusion. Continue empiric antibiotics with IV ceftriaxone and azithromycin Follow blood cultures (NGx2), sputum culture, strep pneumonia urinary antigen - negative Continue supplemental oxygen as needed and wean as tolerated No escalation to BiPAP per family wishes  Cellulitis of right lower extremity- (present on admission) Improving with oral antibiotics per family.  She is on IV ceftriaxone as above.  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 [facility])- (present on admission) Follows with hematology, Dr. Irene Limbo.  Has been receiving Rituxan and frequent PRBC transfusions.  Received 1 unit PRBC at cancer center day of admission on 10/30/2021. Hemoglobin 10.8 on admission 9.1 stable today Repeat CBC in [place].m.  Dementia ([place])- (present on admission) Chronic dementia at baseline.  Continue Seroquel and delirium precautions.   DVT prophylaxis: scd Code Status:  dnr Family Communication: none- nephew clay at bedside Disposition:   Status is: Observation The patient will require care spanning > 2 midnights and should be moved to inpatient because: need for IV abx, O2 need, etc         Consultants:  oncology  Procedures:  none  Antimicrobials:  Anti-infectives (From admission, onward)    Start     Dose/Rate Route Frequency Ordered Stop   10/31/21 1800  azithromycin (ZITHROMAX) 500 mg in sodium chloride 0.9 % 250 mL IVPB        500 mg 250 mL/hr over 60 Minutes Intravenous Every 24 hours 10/30/21 2015 11/04/21 1759   10/31/21 1700  cefTRIAXone (ROCEPHIN) 2 g in sodium chloride 0.9 % 100 mL IVPB        2 g 200 mL/hr over 30 Minutes Intravenous Every 24 hours 10/30/21 2015 11/04/21 1659   10/30/21 1600  cefTRIAXone (ROCEPHIN) 1 g in sodium chloride 0.9 % 100 mL IVPB        1 g 200 mL/hr over 30 Minutes Intravenous  Once 10/30/21 1548 10/30/21 1845   10/30/21 1600  azithromycin (ZITHROMAX) 500 mg in sodium chloride 0.9 % 250 mL IVPB        500 mg 250 mL/hr over 60 Minutes Intravenous  Once 10/30/21 1548 10/30/21 1858       Subjective: No new complaints  Objective: Vitals:   10/31/21 1828 10/31/21 2228 11/01/21 0533 11/01/21 0850  BP: (!) 108/42 (!) 109/35 (!) 98/34 (!) 103/44  Pulse: (!) 57 63 (!) 57   Resp: 18 18 18 16   Temp: 97.8 F (36.6 C) (!) 97.4 F (36.3 C) 97.8 F (36.6 C) 97.8 F (  36.6 C)  TempSrc: Oral Oral Oral Oral  SpO2: 97% 99% 93% 96%  Weight:      Height:        Intake/Output Summary (Last 24 hours) at 11/01/2021 1444 Last data filed at 11/01/2021 1046 Gross per 24 hour  Intake 981.45 ml  Output 950 ml  Net 31.45 ml   Filed Weights   10/30/21 1535  Weight: 42.6 kg    Examination:  General: No acute distress. Cardiovascular: RRR Lungs: unlabored Abdomen: Soft, nontender, nondistended Neurological: pleasantly confused. Moves all extremities 4. Cranial nerves II through XII grossly  intact. Extremities: No clubbing or cyanosis. No edema.    Data Reviewed: I have personally reviewed following labs and imaging studies  CBC: Recent Labs  Lab 10/29/21 0921 10/30/21 1645 10/31/21 0535 11/01/21 0616  WBC 10.2 5.0 5.5 8.8  NEUTROABS 8.3*  --   --  5.8  HGB 7.5* 10.8* 9.1* 9.1*  HCT 23.1* 32.0* 27.1* 27.3*  MCV 90.9 93.6 92.2 92.5  PLT 413* 410* 419* 501*    Basic Metabolic Panel: Recent Labs  Lab 10/29/21 0921 10/30/21 1510 10/31/21 0535 11/01/21 0616  NA 136 135 135 136  K 4.2 4.4 3.4* 3.2*  CL 99 99 98 99  CO2 29 27 28 31   GLUCOSE 97 95 84 77  BUN 15 18 20 14   CREATININE 0.49 0.57 0.39* 0.46  CALCIUM 7.9* 7.5* 7.4* 7.4*  MG  --   --   --  1.4*  PHOS  --   --   --  2.4*    GFR: Estimated Creatinine Clearance: 37.7 mL/min (by C-G formula based on SCr of 0.46 mg/dL).  Liver Function Tests: Recent Labs  Lab 10/29/21 0921 10/30/21 1510 11/01/21 0616  AST 50* 51* 26  ALT 14 18 12   ALKPHOS 195* 174* 140*  BILITOT 3.1* 2.9* 0.7  PROT 6.0* 6.0* 5.0*  ALBUMIN 3.5 3.2* 2.7*    CBG: No results for input(s): GLUCAP in the last 168 hours.   Recent Results (from the past 240 hour(s))  Blood culture (routine x 2)     Status: None (Preliminary result)   Collection Time: 10/30/21  4:32 PM   Specimen: BLOOD RIGHT FOREARM  Result Value Ref Range Status   Specimen Description   Final    BLOOD RIGHT FOREARM Performed at [facility] [address] [place], [place] [address]    Special Requests   Final    BOTTLES DRAWN AEROBIC AND ANAEROBIC Blood Culture adequate volume Performed at [facility] [address] [place], [place] [address]    Culture   Final    NO GROWTH 2 DAYS Performed at [facility] [address], [place], [place] [address]    Report Status PENDING  Incomplete  Blood culture (routine x 2)     Status: None (Preliminary result)   Collection Time: 10/30/21  4:45 PM   Specimen:  BLOOD  Result Value Ref Range Status   Specimen Description   Final    BLOOD RIGHT ANTECUBITAL Performed at [facility] [address], [place], [place] [address]    Special Requests   Final    BOTTLES DRAWN AEROBIC AND ANAEROBIC Blood Culture results may not be optimal due to an inadequate volume of blood received in culture bottles Performed at [facility] [address] [place], [place] [address]    Culture   Final    NO GROWTH 2  DAYS Performed at [facility], [place] [address], [place], [place] [address]    Report Status PENDING  Incomplete  Resp Panel by RT-PCR (Flu Breeann Reposa&B, Covid) Nasopharyngeal Swab     Status: None   Collection Time: 10/30/21  5:02 PM   Specimen: Nasopharyngeal Swab; Nasopharyngeal(NP) swabs in vial transport medium  Result Value Ref Range Status   SARS Coronavirus 2 by RT PCR NEGATIVE NEGATIVE Final    Comment: (NOTE) SARS-CoV-2 target nucleic acids are NOT DETECTED.  The SARS-CoV-2 RNA is generally detectable in upper respiratory specimens during the acute phase of infection. The lowest concentration of SARS-CoV-2 viral copies this assay can detect is 138 copies/mL. Almir Botts negative result does not preclude SARS-Cov-2 infection and should not be used as the sole basis for treatment or other patient management decisions. Jibreel Fedewa negative result may occur with  improper specimen collection/handling, submission of specimen other than nasopharyngeal swab, presence of viral mutation(s) within the areas targeted by this assay, and inadequate number of viral copies(<138 copies/mL). Ngoc Daughtridge negative result must be combined with clinical observations, patient history, and epidemiological information. The expected result is Negative.  Fact Sheet for Patients:  EntrepreneurPulse.com.au  Fact Sheet for Healthcare Providers:  IncredibleEmployment.be  This test is no t yet approved or cleared by the  [place] FDA and  has been authorized for detection and/or diagnosis of SARS-CoV-2 by FDA under an Emergency Use Authorization (EUA). This EUA will remain  in effect (meaning this test can be used) for the duration of the COVID-19 declaration under Section 564(b)(1) of the Act, 21 U.S.C.section 360bbb-3(b)(1), unless the authorization is terminated  or revoked sooner.       Influenza [place] by PCR NEGATIVE NEGATIVE Final   Influenza B by PCR NEGATIVE NEGATIVE Final    Comment: (NOTE) The Xpert Xpress SARS-CoV-2/FLU/RSV plus assay is intended as an aid in the diagnosis of influenza from Nasopharyngeal swab specimens and should not be used as Shadi Larner sole basis for treatment. Nasal washings and aspirates are unacceptable for Xpert Xpress SARS-CoV-2/FLU/RSV testing.  Fact Sheet for Patients: EntrepreneurPulse.com.au  Fact Sheet for Healthcare Providers: IncredibleEmployment.be  This test is not yet approved or cleared by the [place] FDA and has been authorized for detection and/or diagnosis of SARS-CoV-2 by FDA under an Emergency Use Authorization (EUA). This EUA will remain in effect (meaning this test can be used) for the duration of the COVID-19 declaration under Section 564(b)(1) of the Act, 21 U.S.C. section 360bbb-3(b)(1), unless the authorization is terminated or revoked.  Performed at [facility], [place] [address], [place], [place] [address]          Radiology Studies: DG Chest Port 1 View  Result Date: 10/30/2021 CLINICAL DATA:  Short of breath, respiratory distress EXAM: PORTABLE CHEST 1 VIEW COMPARISON:  10/03/2021 FINDINGS: Single frontal view of the chest was obtained with the patient rotated markedly toward the left. The cardiac silhouette is stable. There is chronic elevation of the left hemidiaphragm, with increased left basilar consolidation and effusion since prior exam. Minimal hypoventilatory changes at  the right base. No pneumothorax. No acute bony abnormalities. IMPRESSION: 1. Progressive left basilar consolidation and/or effusion. Electronically Signed   By: Randa Ngo M.D.   On: 10/30/2021 15:33        Scheduled Meds:  aspirin EC  325 mg Oral Daily   docusate sodium  100 mg Oral BID   feeding supplement  237 mL Oral BID BM   folic acid  2 mg Oral Daily   multivitamin with minerals  1 tablet Oral Daily   pantoprazole  40 mg Oral Daily   QUEtiapine  25 mg Oral QHS   cyanocobalamin  2,000 mcg Oral Daily   Continuous Infusions:  azithromycin Stopped (10/31/21 1849)   cefTRIAXone (ROCEPHIN)  IV Stopped (10/31/21 1708)     LOS: 1 day    Time spent: over 30 min    Fayrene Helper, MD [facility]   To contact the attending provider between 7A-7P or the covering provider during after hours 7P-7A, please log into the web site [email or web address] and access using universal [place] password for that web site. If you do not have the password, please call the hospital operator.  11/01/2021, 2:44 PM

## 2021-11-02 ENCOUNTER — Other Ambulatory Visit: Payer: Self-pay

## 2021-11-02 DIAGNOSIS — D5912 Cold autoimmune hemolytic anemia: Secondary | ICD-10-CM

## 2021-11-02 DIAGNOSIS — J9601 Acute respiratory failure with hypoxia: Secondary | ICD-10-CM | POA: Diagnosis not present

## 2021-11-02 LAB — COMPREHENSIVE METABOLIC PANEL
ALT: 13 U/L (ref 0–44)
AST: 22 U/L (ref 15–41)
Albumin: 2.9 g/dL — ABNORMAL LOW (ref 3.5–5.0)
Alkaline Phosphatase: 155 U/L — ABNORMAL HIGH (ref 38–126)
Anion gap: 7 (ref 5–15)
BUN: 10 mg/dL (ref 8–23)
CO2: 31 mmol/L (ref 22–32)
Calcium: 7.9 mg/dL — ABNORMAL LOW (ref 8.9–10.3)
Chloride: 98 mmol/L (ref 98–111)
Creatinine, Ser: <0.3 mg/dL — ABNORMAL LOW (ref 0.44–1.00)
Glucose, Bld: 77 mg/dL (ref 70–99)
Potassium: 4.9 mmol/L (ref 3.5–5.1)
Sodium: 136 mmol/L (ref 135–145)
Total Bilirubin: 0.7 mg/dL (ref 0.3–1.2)
Total Protein: 5.3 g/dL — ABNORMAL LOW (ref 6.5–8.1)

## 2021-11-02 LAB — CBC WITH DIFFERENTIAL/PLATELET
Abs Immature Granulocytes: 0.02 10*3/uL (ref 0.00–0.07)
Basophils Absolute: 0 10*3/uL (ref 0.0–0.1)
Basophils Relative: 0 %
Eosinophils Absolute: 0 10*3/uL (ref 0.0–0.5)
Eosinophils Relative: 0 %
HCT: 26.2 % — ABNORMAL LOW (ref 36.0–46.0)
Hemoglobin: 8.4 g/dL — ABNORMAL LOW (ref 12.0–15.0)
Immature Granulocytes: 0 %
Lymphocytes Relative: 25 %
Lymphs Abs: 2.3 10*3/uL (ref 0.7–4.0)
MCH: 30.5 pg (ref 26.0–34.0)
MCHC: 32.1 g/dL (ref 30.0–36.0)
MCV: 95.3 fL (ref 80.0–100.0)
Monocytes Absolute: 0.9 10*3/uL (ref 0.1–1.0)
Monocytes Relative: 10 %
Neutro Abs: 5.9 10*3/uL (ref 1.7–7.7)
Neutrophils Relative %: 65 %
Platelets: 515 10*3/uL — ABNORMAL HIGH (ref 150–400)
RBC: 2.75 MIL/uL — ABNORMAL LOW (ref 3.87–5.11)
RDW: 19.4 % — ABNORMAL HIGH (ref 11.5–15.5)
WBC: 9.2 10*3/uL (ref 4.0–10.5)
nRBC: 0 % (ref 0.0–0.2)

## 2021-11-02 LAB — MAGNESIUM: Magnesium: 2.4 mg/dL (ref 1.7–2.4)

## 2021-11-02 LAB — PHOSPHORUS: Phosphorus: 2 mg/dL — ABNORMAL LOW (ref 2.5–4.6)

## 2021-11-02 MED ORDER — SERTRALINE HCL 25 MG PO TABS
25.0000 mg | ORAL_TABLET | Freq: Every day | ORAL | Status: DC
  Administered 2021-11-02 – 2021-11-06 (×5): 25 mg via ORAL
  Filled 2021-11-02 (×5): qty 1

## 2021-11-02 NOTE — Progress Notes (Signed)
PROGRESS NOTE    [name]  ATF:[id number] DOB: Dec 02, 1940 DOA: 10/30/2021 PCP: [name], MD  Chief Complaint  Patient presents with   Respiratory Distress    Brief Narrative:  [name] is [name] 81 y.o. 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)- (present on admission) SPO2 as low as 53% on room air, requiring 3 L O2 via [place] on admission.  CXR shows increased left basilar pneumonia and/or effusion. Continue empiric antibiotics with IV ceftriaxone and azithromycin Follow blood cultures (NGx2), sputum culture, strep pneumonia urinary antigen - negative Continue supplemental oxygen as needed and wean as tolerated - weaned to RA this afternoon, possible discharge 2/11? No escalation to BiPAP per family wishes  Cellulitis of right lower extremity- (present on admission) Improving with oral antibiotics per family.  She is on IV ceftriaxone as above.  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 [facility])- (present on admission) Follows with hematology, Dr. Irene Limbo.  Has been receiving Rituxan and frequent PRBC transfusions.  Received 1 unit PRBC at cancer center day of admission on 10/30/2021. Hemoglobin 10.8 on admission Mild decrease, 8.4 today, trend Repeat CBC in [name].m. Appreciate oncology assistance (2/10 note)  Dementia ([name])- (present on  admission) Chronic dementia at baseline.  Continue Seroquel and delirium precautions.   DVT prophylaxis: scd Code Status: dnr Family Communication: none- nephew clay at bedside Disposition:   Status is: Observation The patient will require care spanning > 2 midnights and should be moved to inpatient because: need for IV abx, O2 need, etc         Consultants:  oncology  Procedures:  none  Antimicrobials:  Anti-infectives (From admission, onward)    Start     Dose/Rate Route Frequency Ordered Stop   10/31/21 1800  azithromycin (ZITHROMAX) 500 mg in sodium chloride 0.9 % 250 mL IVPB        500 mg 250 mL/hr over 60 Minutes Intravenous Every 24 hours 10/30/21 2015 11/04/21 1759   10/31/21 1700  cefTRIAXone (ROCEPHIN) 2 g in sodium chloride 0.9 % 100 mL IVPB        2 g 200 mL/hr over 30 Minutes Intravenous Every 24 hours 10/30/21 2015 11/04/21 1659   10/30/21 1600  cefTRIAXone (ROCEPHIN) 1 g in sodium chloride 0.9 % 100 mL IVPB        1 g 200 mL/hr over 30 Minutes Intravenous  Once 10/30/21 1548 10/30/21 1845   10/30/21 1600  azithromycin (ZITHROMAX) 500 mg in sodium chloride 0.9 % 250 mL IVPB        500 mg 250 mL/hr over 60 Minutes Intravenous  Once 10/30/21 1548 10/30/21 1858       Subjective: No new complaints  Objective: Vitals:   11/01/21 1736 11/01/21 2106 11/02/21 0558 11/02/21 1505  BP: (!) 110/50 (!) 92/38 (!) 111/46 (!) 108/44  Pulse: 70 (!) 58 64 60  Resp: 18 17 17 17   Temp:  97.6 F (36.4 C) (!) 97.3 F (36.3 C) 97.8 F (36.6 C) 98 F (36.7 C)  TempSrc: Oral Oral Oral Oral  SpO2: 98% 93% 93% 97%  Weight:      Height:        Intake/Output Summary (Last 24 hours) at 11/02/2021 1757 Last data filed at 11/02/2021 0855 Gross per 24 hour  Intake 240 ml  Output 500 ml  Net -260 ml   Filed Weights   10/30/21 1535  Weight: 42.6 kg    Examination:  General: No acute distress. Cardiovascular: RRR Lungs: unlabored Abdomen: Soft, nontender,  nondistended  Neurological: pleasant, moving all extremities Skin: Warm and dry. No rashes or lesions. Extremities: No clubbing or cyanosis. No edema.   Data Reviewed: I have personally reviewed following labs and imaging studies  CBC: Recent Labs  Lab 10/29/21 0921 10/30/21 1645 10/31/21 0535 11/01/21 0616 11/02/21 0544  WBC 10.2 5.0 5.5 8.8 9.2  NEUTROABS 8.3*  --   --  5.8 5.9  HGB 7.5* 10.8* 9.1* 9.1* 8.4*  HCT 23.1* 32.0* 27.1* 27.3* 26.2*  MCV 90.9 93.6 92.2 92.5 95.3  PLT 413* 410* 419* 501* 515*    Basic Metabolic Panel: Recent Labs  Lab 10/29/21 0921 10/30/21 1510 10/31/21 0535 11/01/21 0616 11/02/21 0544  NA 136 135 135 136 136  K 4.2 4.4 3.4* 3.2* 4.9  CL 99 99 98 99 98  CO2 29 27 28 31 31   GLUCOSE 97 95 84 77 77  BUN 15 18 20 14 10   CREATININE 0.49 0.57 0.39* 0.46 <0.30*  CALCIUM 7.9* 7.5* 7.4* 7.4* 7.9*  MG  --   --   --  1.4* 2.4  PHOS  --   --   --  2.4* 2.0*    GFR: CrCl cannot be calculated (This lab value cannot be used to calculate CrCl because it is not Lus Kriegel number: <0.30).  Liver Function Tests: Recent Labs  Lab 10/29/21 0921 10/30/21 1510 11/01/21 0616 11/02/21 0544  AST 50* 51* 26 22  ALT 14 18 12 13   ALKPHOS 195* 174* 140* 155*  BILITOT 3.1* 2.9* 0.7 0.7  PROT 6.0* 6.0* 5.0* 5.3*  ALBUMIN 3.5 3.2* 2.7* 2.9*    CBG: No results for input(s): GLUCAP in the last 168 hours.   Recent Results (from the past 240 hour(s))  Blood culture (routine x 2)     Status: None (Preliminary result)   Collection Time: 10/30/21  4:32 PM   Specimen: BLOOD RIGHT FOREARM  Result Value Ref Range Status   Specimen Description   Final    BLOOD RIGHT FOREARM Performed at [facility] [address] [place], [place] [address]    Special Requests   Final    BOTTLES DRAWN AEROBIC AND ANAEROBIC Blood Culture adequate volume Performed at [facility] [address], [place], [place] [address]    Culture   Final     NO GROWTH 3 DAYS Performed at [facility], [place] [address], [place], [place] [address]    Report Status PENDING  Incomplete  Blood culture (routine x 2)     Status: None (Preliminary result)   Collection Time: 10/30/21  4:45 PM   Specimen: BLOOD  Result Value Ref Range Status   Specimen Description   Final    BLOOD RIGHT ANTECUBITAL Performed at [facility] [address] [place],  [address]    Special Requests   Final    BOTTLES  DRAWN AEROBIC AND ANAEROBIC Blood Culture results may not be optimal due to an inadequate volume of blood received in culture bottles Performed at [facility], [place] [address] [place], [place] [address]    Culture   Final    NO GROWTH 3 DAYS Performed at [facility], [place] [address] [place], [place] [address]    Report Status PENDING  Incomplete  Resp Panel by RT-PCR (Flu Timm Bonenberger&B, Covid) Nasopharyngeal Swab     Status: None   Collection Time: 10/30/21  5:02 PM   Specimen: Nasopharyngeal Swab; Nasopharyngeal(NP) swabs in vial transport medium  Result Value Ref Range Status   SARS Coronavirus 2 by RT PCR NEGATIVE NEGATIVE Final    Comment: (NOTE) SARS-CoV-2 target nucleic acids are NOT DETECTED.  The SARS-CoV-2 RNA is generally detectable in upper respiratory specimens during the acute phase of infection. The lowest concentration of SARS-CoV-2 viral copies this assay can detect is 138 copies/mL. Shabree Tebbetts negative result does not preclude SARS-Cov-2 infection and should not be used as the sole basis for treatment or other patient management decisions. Sabriel Borromeo negative result may occur with  improper specimen collection/handling, submission of specimen other than nasopharyngeal swab, presence of viral mutation(s) within the areas targeted by this assay, and inadequate number of viral copies(<138 copies/mL). Deepa Barthel negative result must be combined with clinical observations, patient history, and  epidemiological information. The expected result is Negative.  Fact Sheet for Patients:  EntrepreneurPulse.com.au  Fact Sheet for Healthcare Providers:  IncredibleEmployment.be  This test is no t yet approved or cleared by the [place] FDA and  has been authorized for detection and/or diagnosis of SARS-CoV-2 by FDA under an Emergency Use Authorization (EUA). This EUA will remain  in effect (meaning this test can be used) for the duration of the COVID-19 declaration under Section 564(b)(1) of the Act, 21 U.S.C.section 360bbb-3(b)(1), unless the authorization is terminated  or revoked sooner.       Influenza Daijah Scrivens by PCR NEGATIVE NEGATIVE Final   Influenza B by PCR NEGATIVE NEGATIVE Final    Comment: (NOTE) The Xpert Xpress SARS-CoV-2/FLU/RSV plus assay is intended as an aid in the diagnosis of influenza from Nasopharyngeal swab specimens and should not be used as Leahann Lempke sole basis for treatment. Nasal washings and aspirates are unacceptable for Xpert Xpress SARS-CoV-2/FLU/RSV testing.  Fact Sheet for Patients: EntrepreneurPulse.com.au  Fact Sheet for Healthcare Providers: IncredibleEmployment.be  This test is not yet approved or cleared by the [place] FDA and has been authorized for detection and/or diagnosis of SARS-CoV-2 by FDA under an Emergency Use Authorization (EUA). This EUA will remain in effect (meaning this test can be used) for the duration of the COVID-19 declaration under Section 564(b)(1) of the Act, 21 U.S.C. section 360bbb-3(b)(1), unless the authorization is terminated or revoked.  Performed at [facility], [place] [address] [place], [place] [address]          Radiology Studies: No results found.      Scheduled Meds:  aspirin EC  325 mg Oral Daily   docusate sodium  100 mg Oral BID   feeding supplement  237 mL Oral BID BM   folic acid  2 mg Oral Daily    multivitamin with minerals  1 tablet Oral Daily   pantoprazole  40 mg Oral Daily   QUEtiapine  25 mg Oral QHS   sertraline  25 mg Oral Daily   cyanocobalamin  2,000 mcg Oral Daily   Continuous Infusions:  azithromycin 500 mg (11/02/21 1627)   cefTRIAXone (ROCEPHIN)  IV 2 g (11/02/21 1554)     LOS: 2 days    Time spent: over 72 min    Fayrene Helper, MD [facility]   To contact the attending provider between 7A-7P or the covering provider during after hours 7P-7A, please log into the web site [email or web address] and access using universal [place] password for that web site. If you do not have the password, please call the hospital operator.  11/02/2021, 5:57 PM

## 2021-11-02 NOTE — Progress Notes (Signed)
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 -CBC from this morning has been reviewed and her WBC is normal, hemoglobin has drifted down to 8.4, and platelets are 515,000.  Her T. bili is normal today at 0.7. -Would recommend CBC and all hemolytic labs to be collected in prewarmed tubes at 37 degrees and samples to be run at 37C to get accurate results. -Would recommend continuing B12 2000 mcg daily and folic acid 2 mg p.o. daily. -The patient needs to be kept in a warm room and be dressed and warm cloths from head to toe limiting any open skin. -All IV fluids and blood products need to be given through a blood warmer. -Transfuse to maintain hemoglobin more than 7 -We will place rituximab on hold.  We will arrange for outpatient follow-up once discharged to reevaluate resuming rituximab. -Management of pneumonia, cellulitis, and other chronic medical conditions per hospitalist. -Per previous discussions regarding goals of care, the patient does not want to continue to pursue directed therapies for her cold agglutinin disease. -Her dementia is significant and we have discussed that when she is discharged from the skilled nursing facility, she has the option to go to hospice or back home with additional support.  She will require 24/7 care at home.  Family is supportive.  Mikey Bussing, DNP, AGPCNP-BC, AOCNP  SUBJECTIVE: [name] is followed by our office for cold agglutinin disease related hemolytic anemia.  She has been receiving weekly rituximab infusions.  Last rituximab was given on 10/25/2021.  She was at the cancer center on the day of  admission for a blood transfusion and found to be hypoxic with an O2 sat initially at 53% on room air.  She was placed on O2 at 5 L and saturation quickly recovered.  This was decreased down to 2 L and she received her unit of blood.  Due to continued hypoxia, she was transferred to the emergency department.  She has been admitted with acute hypoxic respiratory failure secondary to left lower lobe pneumonia.  Receiving IV antibiotics.  Continues to have anemia with a hemoglobin of 8.4 this morning.  No bleeding reported.  The patient offers no specific complaints today.  REVIEW OF SYSTEMS:   Unable to obtain a comprehensive review of systems secondary to dementia.  I have reviewed the past medical history, past surgical history, social history and family history with the patient and they are unchanged from previous note.  PHYSICAL EXAMINATION: ECOG PERFORMANCE STATUS: 2 - Symptomatic, <50% confined to bed  Vitals:   11/01/21 2106 11/02/21 0558  BP: (!) 92/38 (!) 111/46  Pulse: (!) 58 64  Resp: 17 17  Temp: (!) 97.3 F (36.3 C) 97.8 F (36.6 C)  SpO2: 93% 93%   Filed Weights   10/30/21 1535  Weight: 42.6 kg    Intake/Output from previous day: 02/09 0701 - 02/10 0700 In: 240 [P.O.:240] Out: 500 [Urine:500]  Physical Exam Vitals reviewed.  Constitutional:      General: She is not in acute distress. HENT:     Head: Normocephalic.  Eyes:     General: No scleral icterus.    Conjunctiva/sclera: Conjunctivae normal.  Cardiovascular:     Rate  and Rhythm: Normal rate and regular rhythm.  Pulmonary:     Effort: Pulmonary effort is normal. No respiratory distress.  Neurological:     Mental Status: She is alert. She is disoriented.    LABORATORY DATA:  I have reviewed the data as listed CMP Latest Ref Rng & Units 11/02/2021 11/01/2021 10/31/2021  Glucose 70 - 99 mg/dL 77 77 84  BUN 8 - 23 mg/dL 10 14 20   Creatinine 0.44 - 1.00 mg/dL <0.30(L) 0.46 0.39(L)  Sodium 135 - 145 mmol/L  136 136 135  Potassium 3.5 - 5.1 mmol/L 4.9 3.2(L) 3.4(L)  Chloride 98 - 111 mmol/L 98 99 98  CO2 22 - 32 mmol/L 31 31 28   Calcium 8.9 - 10.3 mg/dL 7.9(L) 7.4(L) 7.4(L)  Total Protein 6.5 - 8.1 g/dL 5.3(L) 5.0(L) -  Total Bilirubin 0.3 - 1.2 mg/dL 0.7 0.7 -  Alkaline Phos 38 - 126 U/L 155(H) 140(H) -  AST 15 - 41 U/L 22 26 -  ALT 0 - 44 U/L 13 12 -    Lab Results  Component Value Date   WBC 8.8 11/01/2021   HGB 9.1 (L) 11/01/2021   HCT 27.3 (L) 11/01/2021   MCV 92.5 11/01/2021   PLT 501 (H) 11/01/2021   NEUTROABS 5.8 11/01/2021    No results found for: CEA1, CEA, JHE174, CA125, PSA1  DG Chest Port 1 View  Result Date: 10/30/2021 CLINICAL DATA:  Short of breath, respiratory distress EXAM: PORTABLE CHEST 1 VIEW COMPARISON:  10/03/2021 FINDINGS: Single frontal view of the chest was obtained with the patient rotated markedly toward the left. The cardiac silhouette is stable. There is chronic elevation of the left hemidiaphragm, with increased left basilar consolidation and effusion since prior exam. Minimal hypoventilatory changes at the right base. No pneumothorax. No acute bony abnormalities. IMPRESSION: 1. Progressive left basilar consolidation and/or effusion. Electronically Signed   By: Randa Ngo M.D.   On: 10/30/2021 15:33   DG C-Arm 1-60 Min-No Report  Result Date: 10/04/2021 Fluoroscopy was utilized by the requesting physician.  No radiographic interpretation.   DG C-Arm 1-60 Min-No Report  Result Date: 10/04/2021 Fluoroscopy was utilized by the requesting physician.  No radiographic interpretation.   DG C-Arm 1-60 Min-No Report  Result Date: 10/04/2021 Fluoroscopy was utilized by the requesting physician.  No radiographic interpretation.   DG FEMUR, MIN 2 VIEWS RIGHT  Result Date: 10/05/2021 CLINICAL DATA:  Postop EXAM: RIGHT FEMUR 2 VIEWS COMPARISON:  10/02/2021 FINDINGS: Internal fixation across the proximal right femoral intertrochanteric fracture. No hardware  complicating feature. Mild residual displacement across the fracture fragments. No subluxation or dislocation. IMPRESSION: Internal fixation with mild continued displacement. No hardware complicating feature. Electronically Signed   By: Rolm Baptise M.D.   On: 10/05/2021 00:24   DG FEMUR, MIN 2 VIEWS RIGHT  Result Date: 10/04/2021 CLINICAL DATA:  Intramedullary nail. EXAM: RIGHT FEMUR 2 VIEWS COMPARISON:  Preoperative radiographs 10/02/2021 FINDINGS: Six fluoroscopic spot views of the right femur obtained in the operating room in frontal and lateral projections. Lung intramedullary nail with distal locking and trans trochanteric screw fixation of intertrochanteric femur fracture. Improved fracture alignment from preoperative imaging. Fluoroscopy time 4 minutes 41 seconds. Dose 14.13 mGy. IMPRESSION: Intraoperative fluoroscopy during right femur fracture ORIF. Electronically Signed   By: Keith Rake M.D.   On: 10/04/2021 16:47   VAS [place] LOWER EXTREMITY VENOUS (DVT)  Result Date: 10/31/2021  Lower Venous DVT Study Patient Name:  Tawni Millers  Date of Exam:  10/30/2021 Medical Rec #: [id number]             Accession #:    [id number] Date of Birth: October 06, 1940            Patient Gender: F Patient Age:   81 years Exam Location:  [facility] Procedure:      VAS [place] LOWER EXTREMITY VENOUS (DVT) Referring Phys: Sullivan Lone --------------------------------------------------------------------------------  Indications: Swelling.  Risk Factors: Cancer. Limitations: Poor ultrasound/tissue interface. Comparison Study: No prior studies. Performing Technologist: Oliver Hum RVT  Examination Guidelines: A complete evaluation includes B-mode imaging, spectral Doppler, color Doppler, and power Doppler as needed of all accessible portions of each vessel. Bilateral testing is considered an integral part of a complete examination. Limited examinations for reoccurring indications may be performed as noted. The  reflux portion of the exam is performed with the patient in reverse Trendelenburg.  +---------+---------------+---------+-----------+----------+--------------+  RIGHT     Compressibility Phasicity Spontaneity Properties Thrombus Aging  +---------+---------------+---------+-----------+----------+--------------+  CFV       Full            Yes       Yes                                    +---------+---------------+---------+-----------+----------+--------------+  SFJ       Full                                                             +---------+---------------+---------+-----------+----------+--------------+  FV Prox   Full                                                             +---------+---------------+---------+-----------+----------+--------------+  FV Mid    Full                                                             +---------+---------------+---------+-----------+----------+--------------+  FV Distal Full                                                             +---------+---------------+---------+-----------+----------+--------------+  PFV       Full                                                             +---------+---------------+---------+-----------+----------+--------------+  POP       Full            Yes       Yes                                    +---------+---------------+---------+-----------+----------+--------------+  PTV       Full                                                             +---------+---------------+---------+-----------+----------+--------------+  PERO      Full                                                             +---------+---------------+---------+-----------+----------+--------------+   +---------+---------------+---------+-----------+----------+--------------+  LEFT      Compressibility Phasicity Spontaneity Properties Thrombus Aging  +---------+---------------+---------+-----------+----------+--------------+  CFV       Full            Yes        Yes                                    +---------+---------------+---------+-----------+----------+--------------+  SFJ       Full                                                             +---------+---------------+---------+-----------+----------+--------------+  FV Prox   Full                                                             +---------+---------------+---------+-----------+----------+--------------+  FV Mid    Full                                                             +---------+---------------+---------+-----------+----------+--------------+  FV Distal Full                                                             +---------+---------------+---------+-----------+----------+--------------+  PFV       Full                                                             +---------+---------------+---------+-----------+----------+--------------+  POP       Full            Yes       Yes                                    +---------+---------------+---------+-----------+----------+--------------+  PTV       Full                                                             +---------+---------------+---------+-----------+----------+--------------+  PERO      Full                                                             +---------+---------------+---------+-----------+----------+--------------+     Summary: RIGHT: - There is no evidence of deep vein thrombosis in the lower extremity. However, portions of this examination were limited- see technologist comments above.  - No cystic structure found in the popliteal fossa.  LEFT: - There is no evidence of deep vein thrombosis in the lower extremity.  - No cystic structure found in the popliteal fossa.  *See table(s) above for measurements and observations. Electronically signed by Harold Barban MD on 10/31/2021 at 8:57:06 PM.    Final      Future Appointments  Date Time Provider [place]  11/05/2021  9:00 AM [facility] LAB [facility] None   11/06/2021 11:00 AM [facility] INFUSION [facility] None  11/08/2021 10:00 AM [facility] INFUSION [facility] None  11/12/2021  9:15 AM [facility] LAB [facility] None  11/13/2021  9:00 AM [facility] INFUSION [facility] None      LOS: 2 days

## 2021-11-03 DIAGNOSIS — J9601 Acute respiratory failure with hypoxia: Secondary | ICD-10-CM | POA: Diagnosis not present

## 2021-11-03 LAB — CBC
HCT: 24.5 % — ABNORMAL LOW (ref 36.0–46.0)
Hemoglobin: 8.1 g/dL — ABNORMAL LOW (ref 12.0–15.0)
MCH: 31.2 pg (ref 26.0–34.0)
MCHC: 33.1 g/dL (ref 30.0–36.0)
MCV: 94.2 fL (ref 80.0–100.0)
Platelets: 588 10*3/uL — ABNORMAL HIGH (ref 150–400)
RBC: 2.6 MIL/uL — ABNORMAL LOW (ref 3.87–5.11)
RDW: 19 % — ABNORMAL HIGH (ref 11.5–15.5)
WBC: 9.4 10*3/uL (ref 4.0–10.5)
nRBC: 0 % (ref 0.0–0.2)

## 2021-11-03 NOTE — [place] FL2 (Signed)
[place] LEVEL OF CARE SCREENING TOOL     IDENTIFICATION  Patient Name: [name] Birthdate: [birth year] Sex: female Admission Date (Current Location): 10/30/2021  [facility] and [place] Number:  Herbalist and Address:  [facility],  [place], [place]      Provider Number: [id number]  Attending Physician Name and Address:  Elodia Florence., *  Relative Name and Phone Number:  [name] (sister) Ph: [phone]    Current Level of Care: Hospital Recommended Level of Care: [place] Prior Approval Number:    Date Approved/Denied:   PASRR Number:    Discharge Plan:  ([place])    Current Diagnoses: Patient Active Problem List   Diagnosis Date Noted   Pneumonia 10/31/2021   Acute respiratory failure with hypoxia ([place]) 10/30/2021   Cellulitis of right lower extremity 10/30/2021   Left 9th rib fracture 10/05/2021   Dementia ([place]) 10/03/2021   Intraventricular hemorrhage ([place]) 10/03/2021   Hip fracture ([place]) 10/02/2021   CAP (community acquired pneumonia) 08/10/2021   Sepsis due to undetermined organism ([place]) 08/09/2021   Aortic atherosclerosis ([place]) 08/09/2021   COPD (chronic obstructive pulmonary disease) ([place])    Elevated transaminase level    Symptomatic anemia 05/28/2018   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 ([place]) 02/04/2018   Occult blood in stools 02/04/2018   Ureteral calculus 11/12/2015   Breast cancer screening, high risk patient 08/29/2015   Acute delirium 08/07/2015   Postoperative anemia due to acute blood loss 08/06/2015   Protein-calorie malnutrition, severe 08/04/2015   Closed intertrochanteric fracture of left femur ([place]) 08/03/2015   Fall 08/03/2015   Closed right hip fracture ([place]) 08/03/2015   Hyperbilirubinemia 08/01/2013   Hypokalemia 07/30/2013   Vitamin B 12 deficiency 08/07/2012   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[place]) 06/10/2012   Tobacco abuse 06/10/2012    Orientation RESPIRATION BLADDER Height & Weight     Self    Incontinent Weight: 42.6 kg Height:  5\' 1"  (154.9 cm)  BEHAVIORAL SYMPTOMS/MOOD NEUROLOGICAL BOWEL NUTRITION STATUS     (N/A) Incontinent Diet (Regular diet)  AMBULATORY STATUS COMMUNICATION OF NEEDS Skin     Verbally Skin abrasions, Other (Comment) (Abrasions: bilateral legs; Ecchymosis: bilateral arms)                       Personal Care Assistance Level of Assistance  Bathing, Feeding, Dressing           Functional Limitations Info  Sight, Hearing, Speech Sight Info: Adequate Hearing Info: Adequate Speech Info: Adequate    SPECIAL CARE FACTORS FREQUENCY                       Contractures Contractures Info: Not present    Additional Factors Info  Code Status, Allergies, Psychotropic Code Status Info: DNR Allergies Info: Other, Shellfish Allergy Psychotropic Info: Seroquel         Current Medications (11/03/2021):  This is the current hospital active medication list Current Facility-Administered Medications  Medication Dose Route Frequency Provider Last Rate Last Admin   acetaminophen (TYLENOL) tablet 650 mg  650 mg Oral Q6H PRN Lenore Cordia, MD   650 mg at 11/02/21 1257   Or   acetaminophen (TYLENOL) suppository 650 mg  650 mg Rectal Q6H PRN Lenore Cordia, MD       albuterol (PROVENTIL) (2.5 MG/3ML) 0.083% nebulizer solution  2.5 mg  2.5 mg Nebulization Q2H PRN Lenore Cordia, MD       aspirin EC tablet 325 mg  325 mg Oral Daily Zada Finders R, MD   325 mg at 11/03/21 1237   azithromycin (ZITHROMAX) 500 mg in sodium chloride 0.9 % 250 mL IVPB  500 mg Intravenous Q24H Lenore Cordia, MD   Stopped at 11/02/21 1727   cefTRIAXone (ROCEPHIN) 2 g in sodium chloride 0.9 % 100 mL IVPB  2 g Intravenous Q24H Lenore Cordia, MD   Stopped at 11/02/21 1624   docusate sodium (COLACE) capsule 100 mg  100 mg Oral BID Zada Finders R,  MD   100 mg at 11/02/21 0845   feeding supplement (ENSURE ENLIVE / ENSURE PLUS) liquid 237 mL  237 mL Oral BID BM Elodia Florence., MD   237 mL at 53/61/44 3154   folic acid (FOLVITE) tablet 2 mg  2 mg Oral Daily Zada Finders R, MD   2 mg at 11/03/21 1238   melatonin tablet 3 mg  3 mg Oral QHS PRN Lenore Cordia, MD   3 mg at 11/02/21 2103   multivitamin with minerals tablet 1 tablet  1 tablet Oral Daily Zada Finders R, MD   1 tablet at 11/03/21 1236   ondansetron (ZOFRAN) tablet 4 mg  4 mg Oral Q6H PRN Lenore Cordia, MD       Or   ondansetron (ZOFRAN) injection 4 mg  4 mg Intravenous Q6H PRN Zada Finders R, MD   4 mg at 10/31/21 1428   pantoprazole (PROTONIX) EC tablet 40 mg  40 mg Oral Daily Zada Finders R, MD   40 mg at 11/03/21 1239   QUEtiapine (SEROQUEL) tablet 25 mg  25 mg Oral QHS Zada Finders R, MD   25 mg at 11/02/21 2103   senna-docusate (Senokot-S) tablet 1 tablet  1 tablet Oral QHS PRN Lenore Cordia, MD       sertraline (ZOLOFT) tablet 25 mg  25 mg Oral Daily Elodia Florence., MD   25 mg at 11/03/21 1240   vitamin B-12 (CYANOCOBALAMIN) tablet 2,000 mcg  2,000 mcg Oral Daily Lenore Cordia, MD   2,000 mcg at 11/03/21 1237     Discharge Medications: Please see discharge summary for a list of discharge medications.  Relevant Imaging Results:  Relevant Lab Results:   Additional Information SSN: [id number]  Trish Mage, LCSW

## 2021-11-03 NOTE — TOC Progression Note (Signed)
Transition of Care [facility]) - Progression Note    Patient Details  Name: [name] MRN: [id number] Date of Birth: Feb 03, 1941  Transition of Care [facility]) CM/SW [place], [place] Phone Number: 11/03/2021, 1:54 PM  Clinical Narrative:   Patient who was originally OK'd for d/c today was given green light by [name]'s sub to return today.  MD then changed d/c to tomorrow as patient still needs to be fully weaned off of O2.  Contacted [name]'s sub [via [name]'s phone number] and gave her update.  She confirmed that they can receive patient back tomorrow.  No insurance auth needed.  FL2 is completed. TOC will continue to follow during the course of hospitalization.     Expected Discharge Plan: [name] Barriers to Discharge: Continued Medical Work up  Expected Discharge Plan and Services Expected Discharge Plan: [name] In-house Referral: Clinical Social Work     Living arrangements for the past 2 months: [name], [name]                 DME Arranged: N/A DME Agency: NA                   Social Determinants of Health ([place]) Interventions    Readmission Risk Interventions Readmission Risk Prevention Plan 08/20/2021  Transportation Screening Complete  PCP or Specialist Appt within 5-7 Days Complete  Home Care Screening Complete  Medication Review (RN CM) Complete  Some recent data might be hidden

## 2021-11-03 NOTE — Progress Notes (Signed)
PROGRESS NOTE    [name]  BHA:[id number] DOB: 03/26/1941 DOA: 10/30/2021 PCP: [name], MD  Chief Complaint  Patient presents with   Respiratory Distress    Brief Narrative:  [name] is [name] 81 y.o. 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  CXR shows increased left basilar pneumonia and/or effusion. Continue empiric antibiotics with IV ceftriaxone and azithromycin (2/6-present) Follow blood cultures (NGx2), sputum culture, strep pneumonia urinary antigen - negative Continue supplemental oxygen as needed and wean as tolerated - continued O2 requirement today, will wean as tolerated No escalation to BiPAP per family wishes  Cellulitis of right lower extremity- (present on admission) Improving with oral antibiotics per family.  She is on IV ceftriaxone as above.  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 [facility])- (present on admission) Follows with hematology, Dr. Irene Limbo.  Has been receiving Rituxan and frequent PRBC transfusions.  Received 1 unit PRBC at cancer center day of admission on 10/30/2021. Hemoglobin 10.8 on admission Mild decrease, 8.4 today, no labs today, will follow Appreciate oncology assistance (2/10 note)  Dementia ([place])-  (present on admission) Chronic dementia at baseline.  Continue Seroquel and delirium precautions.   DVT prophylaxis: scd Code Status: dnr Family Communication: none- nephew clay at bedside Disposition:   Status is: Observation The patient will require care spanning > 2 midnights and should be moved to inpatient because: need for IV abx, O2 need, etc         Consultants:  oncology  Procedures:  none  Antimicrobials:  Anti-infectives (From admission, onward)    Start     Dose/Rate Route Frequency Ordered Stop   10/31/21 1800  azithromycin (ZITHROMAX) 500 mg in sodium chloride 0.9 % 250 mL IVPB        500 mg 250 mL/hr over 60 Minutes Intravenous Every 24 hours 10/30/21 2015 11/04/21 1759   10/31/21 1700  cefTRIAXone (ROCEPHIN) 2 g in sodium chloride 0.9 % 100 mL IVPB        2 g 200 mL/hr over 30 Minutes Intravenous Every 24 hours 10/30/21 2015 11/03/21 1619   10/30/21 1600  cefTRIAXone (ROCEPHIN) 1 g in sodium chloride 0.9 % 100 mL IVPB        1 g 200 mL/hr over 30 Minutes Intravenous  Once 10/30/21 1548 10/30/21 1845   10/30/21 1600  azithromycin (ZITHROMAX) 500 mg in sodium chloride 0.9 % 250 mL IVPB        500 mg 250 mL/hr over 60 Minutes Intravenous  Once 10/30/21 1548 10/30/21 1858       Subjective: No new complaints confused  Objective: Vitals:   11/03/21 0502 11/03/21 1100 11/03/21 1317 11/03/21 1332  BP: (!) 123/42 132/69 (!) 142/60 (!) 128/43  Pulse: 64 64 72 64  Resp: 18  17 16   Temp:  98 F (36.7 C) (!) 96.9 F (36.1 C) 98.7 F (37.1 C) 97.8 F (36.6 C)  TempSrc: Oral Oral Oral   SpO2: 91% 94% 96% 96%  Weight:      Height:        Intake/Output Summary (Last 24 hours) at 11/03/2021 1643 Last data filed at 11/03/2021 1300 Gross per 24 hour  Intake 360 ml  Output 500 ml  Net -140 ml   Filed Weights   10/30/21 1535  Weight: 42.6 kg    Examination:  General: No acute distress. Cardiovascular: Heart sounds show [name] regular rate, and rhythm.   Lungs: Clear to auscultation bilaterally , unlabored, on 2 L Abdomen: Soft, nontender, nondistended Neurological: pleasantly confused. Moves all extremities 4 . Cranial nerves II through XII grossly intact. Skin: Warm and dry. No rashes or lesions. Extremities: No clubbing or cyanosis. No edema.   Data Reviewed: I have personally reviewed following labs and imaging studies  CBC: Recent Labs  Lab 10/29/21 0921 10/30/21 1645 10/31/21 0535 11/01/21 0616 11/02/21 0544  WBC 10.2 5.0 5.5 8.8 9.2  NEUTROABS 8.3*  --   --  5.8 5.9  HGB 7.5* 10.8* 9.1* 9.1* 8.4*  HCT 23.1* 32.0* 27.1* 27.3* 26.2*  MCV 90.9 93.6 92.2 92.5 95.3  PLT 413* 410* 419* 501* 515*    Basic Metabolic Panel: Recent Labs  Lab 10/29/21 0921 10/30/21 1510 10/31/21 0535 11/01/21 0616 11/02/21 0544  NA 136 135 135 136 136  K 4.2 4.4 3.4* 3.2* 4.9  CL 99 99 98 99 98  CO2 29 27 28 31 31   GLUCOSE 97 95 84 77 77  BUN 15 18 20 14 10   CREATININE 0.49 0.57 0.39* 0.46 <0.30*  CALCIUM 7.9* 7.5* 7.4* 7.4* 7.9*  MG  --   --   --  1.4* 2.4  PHOS  --   --   --  2.4* 2.0*    GFR: CrCl cannot be calculated (This lab value cannot be used to calculate CrCl because it is not [name] number: <0.30).  Liver Function Tests: Recent Labs  Lab 10/29/21 0921 10/30/21 1510 11/01/21 0616 11/02/21 0544  AST 50* 51* 26 22  ALT 14 18 12 13   ALKPHOS 195* 174* 140* 155*  BILITOT 3.1* 2.9* 0.7 0.7  PROT 6.0* 6.0* 5.0* 5.3*  ALBUMIN 3.5 3.2* 2.7* 2.9*    CBG: No results for input(s): GLUCAP in the last 168 hours.   Recent Results (from the past 240 hour(s))  Blood culture (routine x 2)     Status: None (Preliminary result)   Collection Time: 10/30/21  4:32 PM   Specimen: BLOOD RIGHT FOREARM  Result Value Ref Range Status   Specimen Description   Final    BLOOD RIGHT FOREARM Performed at [facility] [address], [place], [place] [address]    Special Requests   Final    BOTTLES DRAWN AEROBIC AND  ANAEROBIC Blood Culture adequate volume Performed at [facility] [address] [place], [place] [address]    Culture   Final    NO GROWTH 4 DAYS Performed at [facility], [place] [address], [place], [place] [address]    Report Status PENDING  Incomplete  Blood culture (routine x 2)     Status: None (Preliminary result)   Collection Time: 10/30/21  4:45 PM   Specimen: BLOOD  Result Value Ref Range Status   Specimen Description   Final    BLOOD RIGHT ANTECUBITAL  Performed at [facility], [place] [address] [place], [place] [address]    Special Requests   Final    BOTTLES DRAWN AEROBIC AND ANAEROBIC Blood Culture results may not be optimal due to an inadequate volume of blood received in culture bottles Performed at [facility] [address] [place], [place] [address]    Culture   Final    NO GROWTH 4 DAYS Performed at [facility], [place] [address], [place], [place] [address]    Report Status PENDING  Incomplete  Resp Panel by RT-PCR (Flu [name]&B, Covid) Nasopharyngeal Swab     Status: None   Collection Time: 10/30/21  5:02 PM   Specimen: Nasopharyngeal Swab; Nasopharyngeal(NP) swabs in vial transport medium  Result Value Ref Range Status   SARS Coronavirus 2 by RT PCR NEGATIVE NEGATIVE Final    Comment: (NOTE) SARS-CoV-2 target nucleic acids are NOT DETECTED.  The SARS-CoV-2 RNA is generally detectable in upper respiratory specimens during the acute phase of infection. The lowest concentration of SARS-CoV-2 viral copies this assay can detect is 138 copies/mL. [name] negative result does not preclude SARS-Cov-2 infection and should not be used as the sole basis for treatment or other patient management decisions. [name] negative result may occur with  improper specimen collection/handling, submission of specimen other than nasopharyngeal swab, presence of viral mutation(s) within the areas targeted by this assay, and  inadequate number of viral copies(<138 copies/mL). [name] negative result must be combined with clinical observations, patient history, and epidemiological information. The expected result is Negative.  Fact Sheet for Patients:  EntrepreneurPulse.com.au  Fact Sheet for Healthcare Providers:  IncredibleEmployment.be  This test is no t yet approved or cleared by the [place] FDA and  has been authorized for detection and/or diagnosis of SARS-CoV-2 by FDA under an Emergency Use Authorization (EUA). This EUA will remain  in effect (meaning this test can be used) for the duration of the COVID-19 declaration under Section 564(b)(1) of the Act, 21 U.S.C.section 360bbb-3(b)(1), unless the authorization is terminated  or revoked sooner.       Influenza [name] by PCR NEGATIVE NEGATIVE Final   Influenza B by PCR NEGATIVE NEGATIVE Final    Comment: (NOTE) The Xpert Xpress SARS-CoV-2/FLU/RSV plus assay is intended as an aid in the diagnosis of influenza from Nasopharyngeal swab specimens and should not be used as [name] sole basis for treatment. Nasal washings and aspirates are unacceptable for Xpert Xpress SARS-CoV-2/FLU/RSV testing.  Fact Sheet for Patients: EntrepreneurPulse.com.au  Fact Sheet for Healthcare Providers: IncredibleEmployment.be  This test is not yet approved or cleared by the [place] FDA and has been authorized for detection and/or diagnosis of SARS-CoV-2 by FDA under an Emergency Use Authorization (EUA). This EUA will remain in effect (meaning this test can be used) for the duration of the COVID-19 declaration under Section 564(b)(1) of the Act, 21 U.S.C. section 360bbb-3(b)(1), unless the authorization is terminated or revoked.  Performed at [facility], [place] [address], [place], [place] [address]          Radiology Studies: No results found.      Scheduled Meds:   aspirin EC  325 mg Oral Daily   docusate sodium  100 mg Oral BID   feeding supplement  237 mL Oral BID BM   folic acid  2 mg Oral Daily   multivitamin with minerals  1 tablet Oral Daily   pantoprazole  40 mg Oral Daily   QUEtiapine  25 mg Oral QHS   sertraline  25 mg Oral Daily   cyanocobalamin  2,000 mcg Oral Daily   Continuous Infusions:  azithromycin 500 mg (11/03/21 1635)     LOS: 3 days    Time spent: over 30 min    Fayrene Helper, MD [facility]   To contact the attending provider between 7A-7P or the covering provider during after hours 7P-7A, please log into the web site [email or web address] and access using universal [place] password for that web site. If you do not have the password, please call the hospital operator.  11/03/2021, 4:43 PM

## 2021-11-04 ENCOUNTER — Inpatient Hospital Stay (HOSPITAL_COMMUNITY): Payer: Medicare Other

## 2021-11-04 DIAGNOSIS — J9601 Acute respiratory failure with hypoxia: Secondary | ICD-10-CM | POA: Diagnosis not present

## 2021-11-04 LAB — CBC WITH DIFFERENTIAL/PLATELET
Abs Immature Granulocytes: 0.04 10*3/uL (ref 0.00–0.07)
Basophils Absolute: 0 10*3/uL (ref 0.0–0.1)
Basophils Relative: 0 %
Eosinophils Absolute: 0.1 10*3/uL (ref 0.0–0.5)
Eosinophils Relative: 1 %
HCT: 25.3 % — ABNORMAL LOW (ref 36.0–46.0)
Hemoglobin: 8.2 g/dL — ABNORMAL LOW (ref 12.0–15.0)
Immature Granulocytes: 1 %
Lymphocytes Relative: 27 %
Lymphs Abs: 1.9 10*3/uL (ref 0.7–4.0)
MCH: 30.5 pg (ref 26.0–34.0)
MCHC: 32.4 g/dL (ref 30.0–36.0)
MCV: 94.1 fL (ref 80.0–100.0)
Monocytes Absolute: 0.6 10*3/uL (ref 0.1–1.0)
Monocytes Relative: 8 %
Neutro Abs: 4.3 10*3/uL (ref 1.7–7.7)
Neutrophils Relative %: 63 %
Platelets: 458 10*3/uL — ABNORMAL HIGH (ref 150–400)
RBC: 2.69 MIL/uL — ABNORMAL LOW (ref 3.87–5.11)
RDW: 19 % — ABNORMAL HIGH (ref 11.5–15.5)
WBC: 7 10*3/uL (ref 4.0–10.5)
nRBC: 0 % (ref 0.0–0.2)

## 2021-11-04 LAB — COMPREHENSIVE METABOLIC PANEL
ALT: 14 U/L (ref 0–44)
AST: 24 U/L (ref 15–41)
Albumin: 3 g/dL — ABNORMAL LOW (ref 3.5–5.0)
Alkaline Phosphatase: 171 U/L — ABNORMAL HIGH (ref 38–126)
Anion gap: 5 (ref 5–15)
BUN: 7 mg/dL — ABNORMAL LOW (ref 8–23)
CO2: 34 mmol/L — ABNORMAL HIGH (ref 22–32)
Calcium: 8.1 mg/dL — ABNORMAL LOW (ref 8.9–10.3)
Chloride: 98 mmol/L (ref 98–111)
Creatinine, Ser: 0.38 mg/dL — ABNORMAL LOW (ref 0.44–1.00)
GFR, Estimated: >60 mL/min (ref >60)
Glucose, Bld: 76 mg/dL (ref 70–99)
Potassium: 4.1 mmol/L (ref 3.5–5.1)
Sodium: 137 mmol/L (ref 135–145)
Total Bilirubin: 0.9 mg/dL (ref 0.3–1.2)
Total Protein: 5.5 g/dL — ABNORMAL LOW (ref 6.5–8.1)

## 2021-11-04 LAB — RESP PANEL BY RT-PCR (FLU A&B, COVID) ARPGX2
Influenza A by PCR: NEGATIVE
Influenza B by PCR: NEGATIVE
SARS Coronavirus 2 by RT PCR: NEGATIVE

## 2021-11-04 LAB — CULTURE, BLOOD (ROUTINE X 2)
Culture: NO GROWTH
Culture: NO GROWTH
Special Requests: ADEQUATE

## 2021-11-04 LAB — MAGNESIUM: Magnesium: 2 mg/dL (ref 1.7–2.4)

## 2021-11-04 LAB — PHOSPHORUS: Phosphorus: 3.5 mg/dL (ref 2.5–4.6)

## 2021-11-04 MED ORDER — IOHEXOL 350 MG/ML SOLN
80.0000 mL | Freq: Once | INTRAVENOUS | Status: AC | PRN
  Administered 2021-11-04: 80 mL via INTRAVENOUS

## 2021-11-04 MED ORDER — SODIUM CHLORIDE (PF) 0.9 % IJ SOLN
INTRAMUSCULAR | Status: AC
  Filled 2021-11-04: qty 50

## 2021-11-04 NOTE — Discharge Summary (Addendum)
Physician Discharge Summary  CHANDEL ZAUN IPJ:[id number] DOB: 1941/06/20 DOA: 10/30/2021  PCP: [name], MD  Admit date: 10/30/2021 Discharge date: 11/04/2021  Time spent: 40 minutes  Recommendations for Outpatient Follow-up:  Follow outpatient CBC/CMP Follow outpatient CXR continue to follow with Dr. Irene Limbo and discuss goals of care and treatment options  Keep her in warm room and dressed with warm cloths from head to toe limiting open skin - recommend CBC and hemolytic labs be collected in prewarmed tubes at 37 degrees and samples be run at 37 degrees to get accurate results - all IV fluids and blood products should be given via blood warmer   Had been stable on RA, then desatting as low as 84%, will hold off on discharge, follow CT study.  She may need to discharge with supplemental oxygen.  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  She's improved with IV antibiotics and has weaned to room air.  Stable for discharge at this time.   See below for additional details  Hospital Course:  Assessment and Plan: * Acute respiratory failure with hypoxia ([place])- (present on admission) At presentation SPO2 as low as 53% on room air, requiring 3 L  O2 via  on admission.  CXR shows increased left basilar pneumonia and/or effusion. Continue empiric antibiotics with IV ceftriaxone and azithromycin (2/7-2/11) Follow blood cultures (NGx5), sputum culture (not collected), strep pneumonia urinary antigen - negative Weaned to RA today, ok for discharge Follow CXR outpatient  Cellulitis of right lower extremity- (present on admission) Improving with oral antibiotics per family.   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 Irene Limbo.  Has been receiving Rituxan and frequent PRBC transfusions.  Received 1 unit PRBC at cancer center day of admission on 10/30/2021. Hemoglobin 10.8 on admission Mild decrease, 8.2 today -> follow outpatient  Appreciate oncology assistance (2/10 note) - follow up with Dr. Irene Limbo after discharge to reevaluate resuming rituximab, B12 7341 mcg daily, folic acid 2 mg  **Would recommend CBC and all hemolytic labs to be collected in prewarmed tubes at 37 degrees and samples to be run at 37C to get accurate results** **The patient needs to be kept in Ariyon Gerstenberger warm room and be dressed and warm cloths from head to toe limiting any open skin**. **All IV fluids and blood products need to be given through Reem Fleury blood warmer**   Dementia ([place])- (present on admission) Chronic dementia at baseline.  Continue Seroquel and delirium precautions.   Procedures: LE [place] Summary:  RIGHT:  - There is no evidence of deep vein thrombosis in the lower extremity.  However, portions of this examination were limited- see technologist  comments above.     - No cystic structure  found in the popliteal fossa.     LEFT:  - There is no evidence of deep vein thrombosis in the lower extremity.     - No cystic structure found in the popliteal fossa.       Consultations: oncology  Discharge Exam: Vitals:   11/04/21 0548 11/04/21 0944  BP: (!)  154/53   Pulse: (!) 56   Resp: 14   Temp: (!) 97.3 F (36.3 C)   SpO2: 97% 97%   Pleasantly confused Says she wants to stay here because she likes it here  General: No acute distress. Cardiovascular: Heart sounds show Ariely Riddell regular rate, and rhythm.  Lungs: CTAB Abdomen: Soft, nontender, nondistended  Neurological: Alert and oriented 3. Moves all extremities 4 . Cranial nerves II through XII grossly intact. Skin: erythema to LE is mild, cellulitis appears improved Extremities: no LEE   Discharge Instructions   Discharge Instructions     Call MD for:  difficulty breathing, headache or visual disturbances   Complete by: As directed    Call MD for:  extreme fatigue   Complete by: As directed    Call MD for:  hives   Complete by: As directed    Call MD for:  persistant dizziness or light-headedness   Complete by: As directed    Call MD for:  persistant nausea and vomiting   Complete by: As directed    Call MD for:  redness, tenderness, or signs of infection (pain, swelling, redness, odor or green/yellow discharge around incision site)   Complete by: As directed    Call MD for:  severe uncontrolled pain   Complete by: As directed    Call MD for:  temperature >100.4   Complete by: As directed    Diet - low sodium heart healthy   Complete by: As directed    Discharge instructions   Complete by: As directed    You were seen for pneumonia.  You've gradually improved with antibiotics.  Follow up Ramelo Oetken repeat chest x ray with your PCP outpatient.   We'll send you back to the skilled nursing facility today.  Please follow up with Dr. Irene Limbo as planned and continue to discuss goals of care and future treatment.  Return for new, recurrent, or worsening symptoms.  Please ask your PCP to request records from this hospitalization so they know what was done and what the next steps will be.   Increase activity slowly   Complete by: As directed    No wound care   Complete by: As directed        Allergies as of 11/04/2021       Reactions   Other Other (See Comments)   "Most antibiotics cause yeast infections"   Shellfish Allergy Hives        Medication List     STOP taking these medications    amoxicillin-clavulanate 875-125 MG tablet Commonly known as: AUGMENTIN   doxycycline 100 MG tablet Commonly known as: VIBRA-TABS   melatonin 3 MG Tabs tablet   saccharomyces boulardii 250 MG capsule Commonly known as: FLORASTOR   traMADol 50 MG tablet Commonly known as: ULTRAM       TAKE these medications    acetaminophen 500 MG tablet Commonly known as: TYLENOL Take 500 mg by mouth every 6 (six) hours as needed for moderate pain.   aspirin EC 325 MG tablet Take 1 tablet (325 mg total) by mouth daily. X 5 weeks for DVT prophylaxis What changed: additional instructions  cyanocobalamin 2000 MCG tablet Take 1 tablet (2,000 mcg total) by mouth daily.   docusate sodium 100 MG capsule Commonly known as: COLACE Take 1 capsule (100 mg total) by mouth 2 (two) times daily.   folic acid 1 MG tablet Commonly known as: FOLVITE Take 2 tablets (2 mg total) by mouth daily.   loratadine 10 MG tablet Commonly known as: CLARITIN Take 10 mg by mouth daily as needed for allergies.   multivitamin with minerals Tabs tablet Take 1 tablet by mouth daily.   OXYGEN Inhale 2 L into the lungs See admin instructions. To maintain SAT greater than 90%   pantoprazole 40 MG tablet Commonly known as: PROTONIX Take 1 tablet (40 mg total) by mouth daily at 6 (six) AM.   PRESCRIPTION MEDICATION Take 90 mLs by mouth daily. Med Pass 2.0   QUEtiapine 25 MG tablet Commonly known as: SEROquel Take 1 tablet (25 mg total) by mouth at bedtime.   sertraline 25 MG tablet Commonly known as: ZOLOFT Take 25 mg by mouth daily.       Allergies  Allergen Reactions   Other Other (See Comments)    "Most antibiotics cause yeast infections"   Shellfish Allergy Hives      The  results of significant diagnostics from this hospitalization (including imaging, microbiology, ancillary and laboratory) are listed below for reference.    Significant Diagnostic Studies: DG Chest Port 1 View  Result Date: 10/30/2021 CLINICAL DATA:  Short of breath, respiratory distress EXAM: PORTABLE CHEST 1 VIEW COMPARISON:  10/03/2021 FINDINGS: Single frontal view of the chest was obtained with the patient rotated markedly toward the left. The cardiac silhouette is stable. There is chronic elevation of the left hemidiaphragm, with increased left basilar consolidation and effusion since prior exam. Minimal hypoventilatory changes at the right base. No pneumothorax. No acute bony abnormalities. IMPRESSION: 1. Progressive left basilar consolidation and/or effusion. Electronically Signed   By: Randa Ngo M.D.   On: 10/30/2021 15:33   VAS [place] LOWER EXTREMITY VENOUS (DVT)  Result Date: 10/31/2021  Lower Venous DVT Study Patient Name:  DEVONIA FARRO  Date of Exam:   10/30/2021 Medical Rec #: [id number]             Accession #:    [id number] Date of Birth: Apr 30, 1941            Patient Gender: F Patient Age:   26 years Exam Location:  [facility] Procedure:      VAS [place] LOWER EXTREMITY VENOUS (DVT) Referring Phys: Sullivan Lone --------------------------------------------------------------------------------  Indications: Swelling.  Risk Factors: Cancer. Limitations: Poor ultrasound/tissue interface. Comparison Study: No prior studies. Performing Technologist: Oliver Hum RVT  Examination Guidelines: Indalecio Malmstrom complete evaluation includes B-mode imaging, spectral Doppler, color Doppler, and power Doppler as needed of all accessible portions of each vessel. Bilateral testing is considered an integral part of Pamala Hayman complete examination. Limited examinations for reoccurring indications may be performed as noted. The reflux portion of the exam is performed with the patient in reverse Trendelenburg.   +---------+---------------+---------+-----------+----------+--------------+  RIGHT     Compressibility Phasicity Spontaneity Properties Thrombus Aging  +---------+---------------+---------+-----------+----------+--------------+  CFV       Full            Yes       Yes                                    +---------+---------------+---------+-----------+----------+--------------+  SFJ       Full                                                             +---------+---------------+---------+-----------+----------+--------------+  FV Prox   Full                                                             +---------+---------------+---------+-----------+----------+--------------+  FV Mid    Full                                                             +---------+---------------+---------+-----------+----------+--------------+  FV Distal Full                                                             +---------+---------------+---------+-----------+----------+--------------+  PFV       Full                                                             +---------+---------------+---------+-----------+----------+--------------+  POP       Full            Yes       Yes                                    +---------+---------------+---------+-----------+----------+--------------+  PTV       Full                                                             +---------+---------------+---------+-----------+----------+--------------+  PERO      Full                                                             +---------+---------------+---------+-----------+----------+--------------+   +---------+---------------+---------+-----------+----------+--------------+  LEFT      Compressibility Phasicity Spontaneity Properties Thrombus Aging  +---------+---------------+---------+-----------+----------+--------------+  CFV       Full            Yes       Yes                                     +---------+---------------+---------+-----------+----------+--------------+  SFJ       Full                                                             +---------+---------------+---------+-----------+----------+--------------+  FV Prox   Full                                                             +---------+---------------+---------+-----------+----------+--------------+  FV Mid    Full                                                             +---------+---------------+---------+-----------+----------+--------------+  FV Distal Full                                                             +---------+---------------+---------+-----------+----------+--------------+  PFV       Full                                                             +---------+---------------+---------+-----------+----------+--------------+  POP       Full            Yes       Yes                                    +---------+---------------+---------+-----------+----------+--------------+  PTV       Full                                                             +---------+---------------+---------+-----------+----------+--------------+  PERO      Full                                                             +---------+---------------+---------+-----------+----------+--------------+     Summary: RIGHT: - There is no evidence of deep vein thrombosis in the lower extremity. However, portions of this examination were limited- see technologist comments above.  - No cystic structure found in the popliteal fossa.  LEFT: - There is no evidence of deep vein thrombosis in the lower extremity.  - No  cystic structure found in the popliteal fossa.  *See table(s) above for measurements and observations. Electronically signed by Harold Barban MD on 10/31/2021 at 8:57:06 PM.    Final     Microbiology: Recent Results (from the past 240 hour(s))  Blood culture (routine x 2)     Status: None   Collection Time: 10/30/21  4:32 PM   Specimen:  BLOOD RIGHT FOREARM  Result Value Ref Range Status   Specimen Description   Final    BLOOD RIGHT FOREARM Performed at [facility], [place] [address], [place], [place] [address]    Special Requests   Final    BOTTLES DRAWN AEROBIC AND ANAEROBIC Blood Culture adequate volume Performed at [place] [address] [place], [place] [address]    Culture   Final    NO GROWTH 5 DAYS Performed at [facility], [place] [address] [place], [place] [address]    Report Status 11/04/2021 FINAL  Final  Blood culture (routine x 2)     Status: None   Collection Time: 10/30/21  4:45 PM   Specimen: BLOOD  Result Value Ref Range Status   Specimen Description   Final    BLOOD RIGHT ANTECUBITAL Performed at [place] [address], [place], [place] [address]    Special Requests   Final    BOTTLES DRAWN AEROBIC AND ANAEROBIC Blood Culture results may not be optimal due to an inadequate volume of blood received in culture bottles Performed at [place] [address], [place], [place] [address]    Culture   Final    NO GROWTH 5 DAYS Performed at [facility], [place] [address] [place], [place] [address]    Report Status 11/04/2021 FINAL  Final  Resp Panel by RT-PCR (Flu Hollister Wessler&B, Covid) Nasopharyngeal Swab     Status: None   Collection Time: 10/30/21  5:02 PM   Specimen: Nasopharyngeal Swab; Nasopharyngeal(NP) swabs in vial transport medium  Result Value Ref Range Status   SARS Coronavirus 2 by RT PCR NEGATIVE NEGATIVE Final    Comment: (NOTE) SARS-CoV-2 target nucleic acids are NOT DETECTED.  The SARS-CoV-2 RNA is generally detectable in upper respiratory specimens during the acute phase of infection. The lowest concentration of SARS-CoV-2 viral copies this assay can detect is 138 copies/mL. Elinora Weigand negative result does not preclude SARS-Cov-2 infection and should not be used as the sole basis for treatment  or other patient management decisions. Vonzell Lindblad negative result may occur with  improper specimen collection/handling, submission of specimen other than nasopharyngeal swab, presence of viral mutation(s) within the areas targeted by this assay, and inadequate number of viral copies(<138 copies/mL). Todd Jelinski negative result must be combined with clinical observations, patient history, and epidemiological information. The expected result is Negative.  Fact Sheet for Patients:  EntrepreneurPulse.com.au  Fact Sheet for Healthcare Providers:  IncredibleEmployment.be  This test is no t yet approved or cleared by the [place] FDA and  has been authorized for detection and/or diagnosis of SARS-CoV-2 by FDA under an Emergency Use Authorization (EUA). This EUA will remain  in effect (meaning this test can be used) for the duration of the COVID-19 declaration under Section 564(b)(1) of the Act, 21 U.S.C.section 360bbb-3(b)(1), unless the authorization is terminated  or revoked sooner.       Influenza Hollis Tuller by PCR NEGATIVE NEGATIVE Final   Influenza B by PCR NEGATIVE NEGATIVE Final    Comment: (NOTE) The Xpert Xpress  SARS-CoV-2/FLU/RSV plus assay is intended as an aid in the diagnosis of influenza from Nasopharyngeal swab specimens and should not be used as Carilyn Woolston sole basis for treatment. Nasal washings and aspirates are unacceptable for Xpert Xpress SARS-CoV-2/FLU/RSV testing.  Fact Sheet for Patients: EntrepreneurPulse.com.au  Fact Sheet for Healthcare Providers: IncredibleEmployment.be  This test is not yet approved or cleared by the [place] FDA and has been authorized for detection and/or diagnosis of SARS-CoV-2 by FDA under an Emergency Use Authorization (EUA). This EUA will remain in effect (meaning this test can be used) for the duration of the COVID-19 declaration under Section 564(b)(1) of the Act, 21 U.S.C. section  360bbb-3(b)(1), unless the authorization is terminated or revoked.  Performed at [facility], [place] [address], [place], [place] [address]      Labs: Basic Metabolic Panel: Recent Labs  Lab 10/30/21 1510 10/31/21 0535 11/01/21 0616 11/02/21 0544 11/04/21 0542  NA 135 135 136 136 137  K 4.4 3.4* 3.2* 4.9 4.1  CL 99 98 99 98 98  CO2 27 28 31 31  34*  GLUCOSE 95 84 77 77 76  BUN 18 20 14 10  7*  CREATININE 0.57 0.39* 0.46 <0.30* 0.38*  CALCIUM 7.5* 7.4* 7.4* 7.9* 8.1*  MG  --   --  1.4* 2.4 2.0  PHOS  --   --  2.4* 2.0* 3.5   Liver Function Tests: Recent Labs  Lab 10/29/21 0921 10/30/21 1510 11/01/21 0616 11/02/21 0544 11/04/21 0542  AST 50* 51* 26 22 24   ALT 14 18 12 13 14   ALKPHOS 195* 174* 140* 155* 171*  BILITOT 3.1* 2.9* 0.7 0.7 0.9  PROT 6.0* 6.0* 5.0* 5.3* 5.5*  ALBUMIN 3.5 3.2* 2.7* 2.9* 3.0*   No results for input(s): LIPASE, AMYLASE in the last 168 hours. No results for input(s): AMMONIA in the last 168 hours. CBC: Recent Labs  Lab 10/29/21 0921 10/30/21 1645 10/31/21 0535 11/01/21 0616 11/02/21 0544 11/03/21 1804 11/04/21 0542  WBC 10.2   < > 5.5 8.8 9.2 9.4 7.0  NEUTROABS 8.3*  --   --  5.8 5.9  --  4.3  HGB 7.5*   < > 9.1* 9.1* 8.4* 8.1* 8.2*  HCT 23.1*   < > 27.1* 27.3* 26.2* 24.5* 25.3*  MCV 90.9   < > 92.2 92.5 95.3 94.2 94.1  PLT 413*   < > 419* 501* 515* 588* 458*   < > = values in this interval not displayed.   Cardiac Enzymes: No results for input(s): CKTOTAL, CKMB, CKMBINDEX, TROPONINI in the last 168 hours. BNP: BNP (last 3 results) No results for input(s): BNP in the last 8760 hours.  ProBNP (last 3 results) No results for input(s): PROBNP in the last 8760 hours.  CBG: No results for input(s): GLUCAP in the last 168 hours.     Signed:  Fayrene Helper MD.  [facility] 11/04/2021, 11:08 AM

## 2021-11-05 ENCOUNTER — Inpatient Hospital Stay: Payer: Medicare Other

## 2021-11-05 ENCOUNTER — Inpatient Hospital Stay (HOSPITAL_COMMUNITY): Payer: Medicare Other

## 2021-11-05 DIAGNOSIS — J90 Pleural effusion, not elsewhere classified: Secondary | ICD-10-CM

## 2021-11-05 DIAGNOSIS — R9389 Abnormal findings on diagnostic imaging of other specified body structures: Secondary | ICD-10-CM

## 2021-11-05 DIAGNOSIS — K5641 Fecal impaction: Secondary | ICD-10-CM

## 2021-11-05 DIAGNOSIS — J9601 Acute respiratory failure with hypoxia: Secondary | ICD-10-CM | POA: Diagnosis not present

## 2021-11-05 LAB — PROTEIN, PLEURAL OR PERITONEAL FLUID: Total protein, fluid: <3 g/dL

## 2021-11-05 LAB — BODY FLUID CELL COUNT WITH DIFFERENTIAL
Eos, Fluid: 0 %
Lymphs, Fluid: 79 %
Monocyte-Macrophage-Serous Fluid: 13 % — ABNORMAL LOW (ref 50–90)
Neutrophil Count, Fluid: 8 % (ref 0–25)
Total Nucleated Cell Count, Fluid: 1446 cu mm — ABNORMAL HIGH (ref 0–1000)

## 2021-11-05 LAB — GLUCOSE, PLEURAL OR PERITONEAL FLUID: Glucose, Fluid: 83 mg/dL

## 2021-11-05 LAB — LACTATE DEHYDROGENASE, PLEURAL OR PERITONEAL FLUID: LD, Fluid: 211 U/L — ABNORMAL HIGH (ref 3–23)

## 2021-11-05 MED ORDER — SODIUM CHLORIDE (PF) 0.9 % IJ SOLN
INTRAMUSCULAR | Status: AC
  Filled 2021-11-05: qty 50

## 2021-11-05 MED ORDER — IOHEXOL 9 MG/ML PO SOLN
ORAL | Status: AC
  Administered 2021-11-05: 500 mL via ORAL
  Filled 2021-11-05: qty 1000

## 2021-11-05 MED ORDER — IOHEXOL 300 MG/ML  SOLN
75.0000 mL | Freq: Once | INTRAMUSCULAR | Status: AC | PRN
  Administered 2021-11-05: 75 mL via INTRAVENOUS

## 2021-11-05 MED ORDER — IOHEXOL 9 MG/ML PO SOLN
500.0000 mL | ORAL | Status: AC
  Administered 2021-11-05: 500 mL via ORAL

## 2021-11-05 MED ORDER — LIDOCAINE HCL 1 % IJ SOLN
INTRAMUSCULAR | Status: AC
  Administered 2021-11-05: 15 mL
  Filled 2021-11-05: qty 20

## 2021-11-05 NOTE — Assessment & Plan Note (Addendum)
Needs outpatient follow up - CT chest with concern for retroperitoneal lymphadenopathy -> CT abd pelvis recommended and did not show abdominopelvic adenopathy  Of note, collapse/consolidative changes in lingula and left lower lobe with attenuation of left lower lobe airway, needs close follow up central obstructing neoplasm can't be excluded

## 2021-11-05 NOTE — Procedures (Addendum)
Ultrasound-guided diagnostic and therapeutic left thoracentesis performed yielding 175 cc of yellow fluid. No immediate complications. Follow up US showed no sig residual free fluid.  Follow-up chest x-ray pending. The fluid was sent to the lab for preordered studies. EBL none.

## 2021-11-05 NOTE — Progress Notes (Signed)
PROGRESS NOTE    [name]  JQB:[id number] DOB: [birth year] DOA: 10/30/2021 PCP: [name], MD  Chief Complaint  Patient presents with   Respiratory Distress    Brief Narrative:  [name] is [name] 81 y.o. 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  She's improved with IV antibiotics and has weaned to room air.  Stable for discharge at this time.   See below for additional details    Assessment & Plan:   Principal Problem:   Acute respiratory failure with hypoxia ([place]) Active Problems:   CAP (community acquired pneumonia)   Exudative pleural effusion   Cellulitis of right lower extremity   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)   Dementia (HCC)   Fecal impaction (HCC)   Abnormal CT scan   Pneumonia   Assessment and Plan: * Acute respiratory failure with hypoxia ([place])- (present on admission) At presentation SPO2 as low as 53% on room air, requiring 3 L O2 via [place] on admission.  CXR shows increased left basilar pneumonia and/or effusion. Continue empiric antibiotics with IV ceftriaxone and azithromycin (2/7-2/11) Follow blood cultures (NGx5), sputum culture (not collected), strep pneumonia urinary antigen - negative CT without PE, but did have collapse/consolidative changes in lingula and LLL with attenuation of LLL airway.  Moderate L effusion and trace R effusion.   Now s/p thora, exudative effusion Follow culture and cytology  Exudative pleural effusion Follow labs Will need follow up  Cellulitis of right lower extremity- (present on admission) Improving with oral antibiotics per family.   Seems improved, s/p 5 days abx here Follow  outpatient  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 [facility])- (present on admission) Follows with hematology, Dr. Irene Limbo.  Has been receiving Rituxan and frequent PRBC transfusions.  Received 1 unit PRBC at cancer center day of admission on 10/30/2021. Hemoglobin 10.8 on admission Mild decrease, 8.2 today -> follow outpatient  Appreciate oncology assistance (2/10 note) - follow up with Dr. Irene Limbo after discharge to reevaluate resuming rituximab, B12 4097 mcg daily, folic acid 2 mg  **Would recommend CBC and all hemolytic labs to be collected in prewarmed tubes at 37 degrees and samples to be run at 37C to get accurate results** **The patient needs to be kept in [name] warm room and be dressed and warm cloths from head to toe limiting any open skin**. **All IV fluids and blood products need to be given through [facility] blood warmer**   Abnormal CT scan Needs outpatient follow up - CT chest with concern for retroperitoneal lymphadenopathy -> CT abd pelvis recommended and did not show abdominopelvic adenopathy  Of note concern for possible central obstructing neoplasm on CT chest, needs ooutpatient follow up  Fecal impaction ([place]) Disimpaction ordered  Dementia ([place])- (present on admission) Chronic dementia at baseline.  Continue Seroquel and delirium precautions.   DVT prophylaxis: scd Code Status: dnr Family Communication: none- nephew clay at bedside Disposition:   Status is: Observation The patient will require care spanning > 2 midnights and should be moved to inpatient because: need for IV abx, O2 need, etc         Consultants:  oncology  Procedures:  none  Antimicrobials:  Anti-infectives (From admission, onward)    Start     Dose/Rate Route  Frequency Ordered Stop   10/31/21 1800  azithromycin (ZITHROMAX) 500 mg in sodium chloride 0.9 % 250 mL IVPB        500 mg 250 mL/hr over 60 Minutes Intravenous Every 24 hours 10/30/21 2015 11/03/21 1735   10/31/21 1700  cefTRIAXone (ROCEPHIN) 2  g in sodium chloride 0.9 % 100 mL IVPB        2 g 200 mL/hr over 30 Minutes Intravenous Every 24 hours 10/30/21 2015 11/03/21 1619   10/30/21 1600  cefTRIAXone (ROCEPHIN) 1 g in sodium chloride 0.9 % 100 mL IVPB        1 g 200 mL/hr over 30 Minutes Intravenous  Once 10/30/21 1548 10/30/21 1845   10/30/21 1600  azithromycin (ZITHROMAX) 500 mg in sodium chloride 0.9 % 250 mL IVPB        500 mg 250 mL/hr over 60 Minutes Intravenous  Once 10/30/21 1548 10/30/21 1858       Subjective: No new complaints  Objective: Vitals:   11/05/21 0413 11/05/21 1314 11/05/21 1525 11/05/21 1557  BP: (!) 148/48 (!) 116/48 (!) 136/54 (!) 121/59  Pulse: 62 62    Resp: 16 16    Temp: 97.6 F (36.4 C) 97.8 F (36.6 C)    TempSrc: Oral Oral    SpO2: 96% 97%    Weight:      Height:        Intake/Output Summary (Last 24 hours) at 11/05/2021 2030 Last data filed at 11/05/2021 1100 Gross per 24 hour  Intake 780 ml  Output 400 ml  Net 380 ml   Filed Weights   10/30/21 1535  Weight: 42.6 kg    Examination:  General: No acute distress. Cardiovascular: RRR Lungs: unlabored Abdomen: Soft, nontender, nondistended Neurological: Pleasantly confused. Moves all extremities 4 . Cranial nerves II through XII grossly intact. Skin: Warm and dry. No rashes or lesions. Extremities: No clubbing or cyanosis. No edema.   Data Reviewed: I have personally reviewed following labs and imaging studies  CBC: Recent Labs  Lab 10/31/21 0535 11/01/21 0616 11/02/21 0544 11/03/21 1804 11/04/21 0542  WBC 5.5 8.8 9.2 9.4 7.0  NEUTROABS  --  5.8 5.9  --  4.3  HGB 9.1* 9.1* 8.4* 8.1* 8.2*  HCT 27.1* 27.3* 26.2* 24.5* 25.3*  MCV 92.2 92.5 95.3 94.2 94.1  PLT 419* 501* 515* 588* 458*    Basic Metabolic Panel: Recent Labs  Lab 10/30/21 1510 10/31/21 0535 11/01/21 0616 11/02/21 0544 11/04/21 0542  NA 135 135 136 136 137  K 4.4 3.4* 3.2* 4.9 4.1  CL 99 98 99 98 98  CO2 27 28 31 31  34*  GLUCOSE 95 84 77  77 76  BUN 18 20 14 10  7*  CREATININE 0.57 0.39* 0.46 <0.30* 0.38*  CALCIUM 7.5* 7.4* 7.4* 7.9* 8.1*  MG  --   --  1.4* 2.4 2.0  PHOS  --   --  2.4* 2.0* 3.5    GFR: Estimated Creatinine Clearance: 37.7 mL/min (Anaia Frith) (by C-G formula based on SCr of 0.38 mg/dL (L)).  Liver Function Tests: Recent Labs  Lab 10/30/21 1510 11/01/21 0616 11/02/21 0544 11/04/21 0542  AST 51* 26 22 24   ALT 18 12 13 14   ALKPHOS 174* 140* 155* 171*  BILITOT 2.9* 0.7 0.7 0.9  PROT 6.0* 5.0* 5.3* 5.5*  ALBUMIN 3.2* 2.7* 2.9* 3.0*    CBG: No results for input(s): GLUCAP in the last 168 hours.   Recent Results (from the past 240 hour(s))  Blood culture (routine x 2)     Status: None   Collection Time: 10/30/21  4:32 PM   Specimen: BLOOD RIGHT FOREARM  Result Value Ref Range Status   Specimen Description   Final    BLOOD RIGHT FOREARM Performed at [facility] [address], [place], [place] [address]    Special Requests   Final    BOTTLES DRAWN AEROBIC AND ANAEROBIC Blood Culture adequate volume Performed at [facility] [address], [place], [place] [address]    Culture   Final    NO GROWTH 5 DAYS Performed at [facility], [place] [address] [place], [place] [address]    Report Status 11/04/2021 FINAL  Final  Blood culture (routine x 2)     Status: None   Collection Time: 10/30/21  4:45 PM   Specimen: BLOOD  Result Value Ref Range Status   Specimen Description   Final    BLOOD RIGHT ANTECUBITAL Performed at [facility] [address] [place], [place] [address]    Special Requests   Final    BOTTLES DRAWN AEROBIC AND ANAEROBIC Blood Culture results may not be optimal due to an inadequate volume of blood received in culture bottles Performed at [facility] [address], [place], [place] [address]    Culture   Final    NO GROWTH 5 DAYS Performed at [facility], [place] [address],  [place], [place] [address]    Report Status 11/04/2021 FINAL  Final  Resp Panel by RT-PCR (Flu Blonnie Maske&B, Covid) Nasopharyngeal Swab     Status: None   Collection Time: 10/30/21  5:02 PM   Specimen: Nasopharyngeal Swab; Nasopharyngeal(NP) swabs in vial transport medium  Result Value Ref Range Status   SARS Coronavirus 2 by RT PCR NEGATIVE NEGATIVE Final    Comment: (NOTE) SARS-CoV-2 target nucleic acids are NOT DETECTED.  The SARS-CoV-2 RNA is generally detectable in upper respiratory specimens during the acute phase of infection. The lowest concentration of SARS-CoV-2 viral copies this assay can detect is 138 copies/mL. Audri Kozub negative result does not preclude SARS-Cov-2 infection and should not be used as the sole basis for treatment or other patient management decisions. Anjolina Byrer negative result may occur with  improper specimen collection/handling, submission of specimen other than nasopharyngeal swab, presence of viral mutation(s) within the areas targeted by this assay, and inadequate number of viral copies(<138 copies/mL). Mylasia Vorhees negative result must be combined with clinical observations, patient history, and epidemiological information. The expected result is Negative.  Fact Sheet for Patients:  EntrepreneurPulse.com.au  Fact Sheet for Healthcare Providers:  IncredibleEmployment.be  This test is no t yet approved or cleared by the [place] FDA and  has been authorized for detection and/or diagnosis of SARS-CoV-2 by FDA under an Emergency Use Authorization (EUA). This EUA will remain  in effect (meaning this test can be used) for the duration of the COVID-19 declaration under Section 564(b)(1) of the Act, 21 U.S.C.section 360bbb-3(b)(1), unless the authorization is terminated  or revoked sooner.       Influenza Rawad Bochicchio by PCR NEGATIVE NEGATIVE Final   Influenza B by PCR NEGATIVE NEGATIVE Final    Comment: (NOTE) The Xpert Xpress SARS-CoV-2/FLU/RSV plus assay is  intended as an aid in the diagnosis of influenza from Nasopharyngeal swab specimens and should not be used as Tajae Rybicki sole basis for treatment. Nasal washings and aspirates are unacceptable for Xpert Xpress SARS-CoV-2/FLU/RSV testing.  Fact Sheet for  Patients: EntrepreneurPulse.com.au  Fact Sheet for Healthcare Providers: IncredibleEmployment.be  This test is not yet approved or cleared by the [place] FDA and has been authorized for detection and/or diagnosis of SARS-CoV-2 by FDA under an Emergency Use Authorization (EUA). This EUA will remain in effect (meaning this test can be used) for the duration of the COVID-19 declaration under Section 564(b)(1) of the Act, 21 U.S.C. section 360bbb-3(b)(1), unless the authorization is terminated or revoked.  Performed at [facility], [place] [address], [place], [place] [address]   Resp Panel by RT-PCR (Flu Twana Wileman&B, Covid) Nasopharyngeal Swab     Status: None   Collection Time: 11/04/21 11:39 AM   Specimen: Nasopharyngeal Swab; Nasopharyngeal(NP) swabs in vial transport medium  Result Value Ref Range Status   SARS Coronavirus 2 by RT PCR NEGATIVE NEGATIVE Final    Comment: (NOTE) SARS-CoV-2 target nucleic acids are NOT DETECTED.  The SARS-CoV-2 RNA is generally detectable in upper respiratory specimens during the acute phase of infection. The lowest concentration of SARS-CoV-2 viral copies this assay can detect is 138 copies/mL. Alilah Mcmeans negative result does not preclude SARS-Cov-2 infection and should not be used as the sole basis for treatment or other patient management decisions. Imogean Ciampa negative result may occur with  improper specimen collection/handling, submission of specimen other than nasopharyngeal swab, presence of viral mutation(s) within the areas targeted by this assay, and inadequate number of viral copies(<138 copies/mL). Othello Dickenson negative result must be combined with clinical observations,  patient history, and epidemiological information. The expected result is Negative.  Fact Sheet for Patients:  EntrepreneurPulse.com.au  Fact Sheet for Healthcare Providers:  IncredibleEmployment.be  This test is no t yet approved or cleared by the [place] FDA and  has been authorized for detection and/or diagnosis of SARS-CoV-2 by FDA under an Emergency Use Authorization (EUA). This EUA will remain  in effect (meaning this test can be used) for the duration of the COVID-19 declaration under Section 564(b)(1) of the Act, 21 U.S.C.section 360bbb-3(b)(1), unless the authorization is terminated  or revoked sooner.       Influenza Jesscia Imm by PCR NEGATIVE NEGATIVE Final   Influenza B by PCR NEGATIVE NEGATIVE Final    Comment: (NOTE) The Xpert Xpress SARS-CoV-2/FLU/RSV plus assay is intended as an aid in the diagnosis of influenza from Nasopharyngeal swab specimens and should not be used as Evea Sheek sole basis for treatment. Nasal washings and aspirates are unacceptable for Xpert Xpress SARS-CoV-2/FLU/RSV testing.  Fact Sheet for Patients: EntrepreneurPulse.com.au  Fact Sheet for Healthcare Providers: IncredibleEmployment.be  This test is not yet approved or cleared by the [place] FDA and has been authorized for detection and/or diagnosis of SARS-CoV-2 by FDA under an Emergency Use Authorization (EUA). This EUA will remain in effect (meaning this test can be used) for the duration of the COVID-19 declaration under Section 564(b)(1) of the Act, 21 U.S.C. section 360bbb-3(b)(1), unless the authorization is terminated or revoked.  Performed at [facility], [place] [address] [place], [place] [address]          Radiology Studies: DG Chest 1 View  Result Date: 11/05/2021 CLINICAL DATA:  Status post left thoracentesis EXAM: CHEST  1 VIEW COMPARISON:  10/30/2021 FINDINGS: No significant  change in moderate left pleural effusion associated atelectasis or consolidation. No pneumothorax. The right lung is normally aerated. Cardiomegaly. IMPRESSION: No significant change in moderate left pleural effusion associated atelectasis or consolidation. No pneumothorax. Electronically Signed   By: Delanna Ahmadi M.D.   On:  11/05/2021 16:21   CT Angio Chest Pulmonary Embolism (PE) W or WO Contrast  Result Date: 11/04/2021 CLINICAL DATA:  Chest pain. EXAM: CT ANGIOGRAPHY CHEST WITH CONTRAST TECHNIQUE: Multidetector CT imaging of the chest was performed using the standard protocol during bolus administration of intravenous contrast. Multiplanar CT image reconstructions and MIPs were obtained to evaluate the vascular anatomy. RADIATION DOSE REDUCTION: This exam was performed according to the departmental dose-optimization program which includes automated exposure control, adjustment of the mA and/or kV according to patient size and/or use of iterative reconstruction technique. CONTRAST:  88mL OMNIPAQUE IOHEXOL 350 MG/ML SOLN COMPARISON:  Chest CT without contrast 08/13/2019 FINDINGS: Cardiovascular: The heart size is normal. No substantial pericardial effusion. Mild atherosclerotic calcification is noted in the wall of the thoracic aorta. There is no filling defect within the opacified pulmonary arteries to suggest the presence of an acute pulmonary embolus. Mediastinum/Nodes: No mediastinal lymphadenopathy. There is no hilar lymphadenopathy. The esophagus has normal imaging features. There is no axillary lymphadenopathy. Lungs/Pleura: Centrilobular and paraseptal emphysema evident. Collapse/consolidative changes seen in the lingula and left lower lobe with attenuation of the left lower lobe airway visible on 59/12 and lingular airway on 62/12. Moderate left pleural effusion evident. 9 mm pulmonary nodule identified posterior right upper lobe on image 41/12. Minimal atelectasis noted posterior right lung base  with trace right pleural effusion. Upper Abdomen: Soft tissue fullness noted in the para-aortic space of the upper abdomen (see image 83/6 and 87/6, features raising concern for retroperitoneal lymphadenopathy. Musculoskeletal: Bones are diffusely demineralized nonacute left-sided rib fractures evident. Status post vertebral augmentation at T12 with un treated T11 compression fracture noted. There is Marnell Mcdaniel comminuted fracture of the lower sternum, new since 08/13/2019 but associated bony callus suggests nonacute injury. Review of the MIP images confirms the above findings. IMPRESSION: 1. No CT evidence for acute pulmonary embolus. 2. Collapse/consolidative changes in the lingula and left lower lobe with attenuation of the left lower lobe airway. Close follow-up recommended as central obstructing neoplasm cannot be excluded. 3. Moderate left pleural effusion with trace right pleural effusion. 4. 9 mm pulmonary nodule posterior right upper lobe. Attention on follow-up recommended. 5. Soft tissue fullness in the para-aortic space of the upper abdomen, features raising concern for retroperitoneal lymphadenopathy. CT abdomen and pelvis with intravenous contrast suggested to further evaluate. 6. Diffuse osteopenia comminuted subacute sternal fracture and T11 superior endplate compression fracture with evidence of prior augmentation at T12. 7. Aortic Atherosclerosis (ICD10-I70.0) and Emphysema (ICD10-J43.9). Electronically Signed   By: Misty Stanley M.D.   On: 11/04/2021 17:54   CT ABDOMEN PELVIS W CONTRAST  Result Date: 11/05/2021 CLINICAL DATA:  Follow-up retroperitoneal adenopathy. Soft tissue fullness in the periaortic space. EXAM: CT ABDOMEN AND PELVIS WITH CONTRAST TECHNIQUE: Multidetector CT imaging of the abdomen and pelvis was performed using the standard protocol following bolus administration of intravenous contrast. RADIATION DOSE REDUCTION: This exam was performed according to the departmental  dose-optimization program which includes automated exposure control, adjustment of the mA and/or kV according to patient size and/or use of iterative reconstruction technique. CONTRAST:  17mL OMNIPAQUE IOHEXOL 300 MG/ML  SOLN COMPARISON:  CTA chest of 1 day prior. Abdominopelvic CT of 08/09/2021 FINDINGS: Lower chest: Left lower lobe consolidation. Persistent small left and trace right pleural effusions. Cardiomegaly. Hepatobiliary: Mildly irregular hepatic capsule. Small gallstones without acute cholecystitis or biliary duct dilatation. Pancreas: Normal for age, with atrophy but no duct dilatation. Spleen: Normal in size, without focal abnormality. Adrenals/Urinary Tract: Bilateral adrenal thickening.  Contrast within bilateral collecting systems presumably from yesterday's CT. No hydronephrosis. Normal urinary bladder. Stomach/Bowel: Proximal gastric underdistention. 8.3 cm stool ball in the rectum. Normal small bowel. Vascular/Lymphatic: Advanced aortic and branch vessel atherosclerosis. No abdominopelvic adenopathy. The abnormality on chest CTA was likely due to prominent diaphragmatic crura. Reproductive: Hysterectomy. 1.6 cm left ovarian residual follicle or cyst. Other: No significant free fluid.  No free intraperitoneal air. Musculoskeletal: Bilateral proximal femur fixation. Osteopenia. Vertebral augmentation at T12. Multiple thoracolumbar compression deformities are similar, without ventral canal encroachment. IMPRESSION: 1. No abdominopelvic adenopathy. The CTA chest finding was likely due to prominent diaphragmatic crura. 2. Stool ball in the rectum suggests fecal impaction. 3. Left larger than right pleural effusions with left base consolidation, similar to yesterday. 4. Cholelithiasis 5. Irregular hepatic capsule. Cannot exclude cirrhosis. Correlate with risk factors. Electronically Signed   By: Abigail Miyamoto M.D.   On: 11/05/2021 15:27   US THORACENTESIS ASP PLEURAL SPACE W/IMG GUIDE  Result Date:  11/05/2021 INDICATION: Patient with history of hemolytic anemia, COPD, dementia, dyspnea, left pleural effusion. Request received for diagnostic and therapeutic left thoracentesis. EXAM: ULTRASOUND GUIDED DIAGNOSTIC AND THERAPEUTIC LEFT THORACENTESIS MEDICATIONS: 8 mL 1% lidocaine COMPLICATIONS: None immediate. PROCEDURE: An ultrasound guided thoracentesis was thoroughly discussed with the patient/patient's sister and questions answered. The benefits, risks, alternatives and complications were also discussed. The patient understands and wishes to proceed with the procedure. Written consent was obtained. Ultrasound was performed to localize and mark an adequate pocket of fluid in the left chest. The area was then prepped and draped in the normal sterile fashion. 1% Lidocaine was used for local anesthesia. Under ultrasound guidance Kiowa Peifer 6 Fr Safe-T-Centesis catheter was introduced. Thoracentesis was performed. The catheter was removed and Trashawn Oquendo dressing applied. FINDINGS: Chelsee Hosie total of approximately 175 cc of yellow fluid was removed. Samples were sent to the laboratory as requested by the clinical team. IMPRESSION: Successful ultrasound guided diagnostic and therapeutic left thoracentesis yielding 175 cc of pleural fluid. Read by: Rowe Robert, PA-C Electronically Signed   By: Ruthann Cancer M.D.   On: 11/05/2021 16:06        Scheduled Meds:  aspirin EC  325 mg Oral Daily   docusate sodium  100 mg Oral BID   feeding supplement  237 mL Oral BID BM   folic acid  2 mg Oral Daily   multivitamin with minerals  1 tablet Oral Daily   pantoprazole  40 mg Oral Daily   QUEtiapine  25 mg Oral QHS   sertraline  25 mg Oral Daily   sodium chloride (PF)       cyanocobalamin  2,000 mcg Oral Daily   Continuous Infusions:     LOS: 5 days    Time spent: over 30 min    Fayrene Helper, MD [facility]   To contact the attending provider between 7A-7P or the covering provider during after hours 7P-7A, please  log into the web site [email or web address] and access using universal [place] password for that web site. If you do not have the password, please call the hospital operator.  11/05/2021, 8:30 PM

## 2021-11-05 NOTE — Assessment & Plan Note (Addendum)
She's having bowel movements Continue bowel regimen at discharge

## 2021-11-05 NOTE — Assessment & Plan Note (Addendum)
Follow labs Will need follow up with Dr. Irene Limbo outpatient, follow cytology and repeat chest imaging Culture pending, gram stain without organisms

## 2021-11-06 ENCOUNTER — Inpatient Hospital Stay: Payer: Medicare Other

## 2021-11-06 DIAGNOSIS — J9601 Acute respiratory failure with hypoxia: Secondary | ICD-10-CM | POA: Diagnosis not present

## 2021-11-06 DIAGNOSIS — R911 Solitary pulmonary nodule: Secondary | ICD-10-CM

## 2021-11-06 LAB — COMPREHENSIVE METABOLIC PANEL
ALT: 12 U/L (ref 0–44)
AST: 14 U/L — ABNORMAL LOW (ref 15–41)
Albumin: 3.2 g/dL — ABNORMAL LOW (ref 3.5–5.0)
Alkaline Phosphatase: 129 U/L — ABNORMAL HIGH (ref 38–126)
Anion gap: 8 (ref 5–15)
BUN: 8 mg/dL (ref 8–23)
CO2: 29 mmol/L (ref 22–32)
Calcium: 8 mg/dL — ABNORMAL LOW (ref 8.9–10.3)
Chloride: 96 mmol/L — ABNORMAL LOW (ref 98–111)
Creatinine, Ser: 0.34 mg/dL — ABNORMAL LOW (ref 0.44–1.00)
GFR, Estimated: >60 mL/min (ref >60)
Glucose, Bld: 71 mg/dL (ref 70–99)
Potassium: 4.1 mmol/L (ref 3.5–5.1)
Sodium: 133 mmol/L — ABNORMAL LOW (ref 135–145)
Total Bilirubin: 0.3 mg/dL (ref 0.3–1.2)
Total Protein: 5.4 g/dL — ABNORMAL LOW (ref 6.5–8.1)

## 2021-11-06 LAB — CBC WITH DIFFERENTIAL/PLATELET
Abs Immature Granulocytes: 0.08 10*3/uL — ABNORMAL HIGH (ref 0.00–0.07)
Basophils Absolute: 0.1 10*3/uL (ref 0.0–0.1)
Basophils Relative: 1 %
Eosinophils Absolute: 0.1 10*3/uL (ref 0.0–0.5)
Eosinophils Relative: 1 %
HCT: 27.6 % — ABNORMAL LOW (ref 36.0–46.0)
Hemoglobin: 9 g/dL — ABNORMAL LOW (ref 12.0–15.0)
Immature Granulocytes: 1 %
Lymphocytes Relative: 15 %
Lymphs Abs: 1.6 10*3/uL (ref 0.7–4.0)
MCH: 30.5 pg (ref 26.0–34.0)
MCHC: 32.6 g/dL (ref 30.0–36.0)
MCV: 93.6 fL (ref 80.0–100.0)
Monocytes Absolute: 0.7 10*3/uL (ref 0.1–1.0)
Monocytes Relative: 7 %
Neutro Abs: 8.5 10*3/uL — ABNORMAL HIGH (ref 1.7–7.7)
Neutrophils Relative %: 75 %
Platelets: 708 10*3/uL — ABNORMAL HIGH (ref 150–400)
RBC: 2.95 MIL/uL — ABNORMAL LOW (ref 3.87–5.11)
RDW: 19.9 % — ABNORMAL HIGH (ref 11.5–15.5)
WBC: 11.1 10*3/uL — ABNORMAL HIGH (ref 4.0–10.5)
nRBC: 0 % (ref 0.0–0.2)

## 2021-11-06 LAB — MAGNESIUM: Magnesium: 2.1 mg/dL (ref 1.7–2.4)

## 2021-11-06 LAB — PHOSPHORUS: Phosphorus: 2.3 mg/dL — ABNORMAL LOW (ref 2.5–4.6)

## 2021-11-06 MED ORDER — POLYETHYLENE GLYCOL 3350 17 G PO PACK: 17.0000 g | PACK | Freq: Every day | ORAL | 0 refills | Status: AC

## 2021-11-06 NOTE — Progress Notes (Signed)
Report called to [facility].

## 2021-11-06 NOTE — Care Management Important Message (Signed)
Important Message  Patient Details IM Letter placed in Patients room. Name: CELA NEWCOM MRN: [id number] Date of Birth: 16-Aug-1941   Medicare Important Message Given:  Yes     Kerin Salen 11/06/2021, 11:02 AM

## 2021-11-06 NOTE — TOC Transition Note (Signed)
Transition of Care [facility]) - CM/SW Discharge Note   Patient Details  Name: [name] MRN: [id number] Date of Birth: [birth year]  [facility] CM/SW Contact:  Lynnell Catalan, RN Phone Number: 11/06/2021, 1:46 PM   Clinical Narrative:     Pt to dc back to [facility] today. She will go via [facility]. Yellow DNR on the chart for transport. She will go to room 209. RN to call report to [phone]. Sister Mrs. Jones alerted of dc.    Barriers to Discharge: Continued Medical Work up   Patient Goals and CMS Choice Patient states their goals for this hospitalization and ongoing recovery are:: Return to [employer] Medicare.gov Compare Post Acute Care list provided to:: Patient Represenative (must comment) [name] (sister)) Choice offered to / list presented to : Sibling Discharge Plan and Services In-house Referral: Clinical Social Work              DME Arranged: N/A DME Agency: NA       Readmission Risk Interventions Readmission Risk Prevention Plan 08/20/2021  Transportation Screening Complete  PCP or Specialist Appt within 5-7 Days Complete  Home Care Screening Complete  Medication Review (RN CM) Complete  Some recent data might be hidden

## 2021-11-06 NOTE — Assessment & Plan Note (Signed)
Needs outpatient follow up

## 2021-11-06 NOTE — Discharge Summary (Signed)
Physician Discharge Summary  [name] UXL:[id number] DOB: 12/06/1940 DOA: 10/30/2021  PCP: Elaine Dy, MD  Admit date: 10/30/2021 Discharge date: 11/06/2021  Time spent: 40 minutes  Recommendations for Outpatient Follow-up:  Follow outpatient CBC/CMP  Continue 1-2 L O2 as needed to keep sats greater than 90% Follow pleural effusion outpatient, will need repeat imaging - follow cytology - of note, had L>R effusions in November as well - will refer to pulm Follow consolidative changes/collapse in lingula and left lower lobe with attenuation of left lower lobe airway - central obstructing neoplasm can't be excluded, follow outpatien t Follow pulm nodule outpatient  Follow with [name] outpatient to discuss goc and treatment options Keep her in warm room and dressed with warm cloths from head to toe limiting open skin - recommend CBC and hemolytic labs be collected in prewarmed tubes at 37 degrees and samples be run at 37 degrees to get accurate results - all IV fluids and blood products should be given via blood warmer   Discharge Diagnoses:  Principal Problem:   Acute respiratory failure with hypoxia ([place]) Active Problems:   CAP (community acquired pneumonia)   Exudative pleural effusion   Cellulitis of right lower extremity   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  She's improved with IV antibiotics.  She had CT scan which showed effusion which was drained by IR.  Plan is for follow up cytology and final culture with oncology outpatient.  She's intermittently required 1-2 L oxygen and will discharge with oxygen as needed to maintain sats over 90%.  She had consolidative/collapse changes in the lingula and left lower lobe which will need to be followed outpatient as well as her exudative effusion.  See below for additional details  Hospital Course:  Assessment and Plan: * Acute respiratory failure with hypoxia ([place])- (present on admission) At presentation SPO2 as low as 53% on room air, requiring 3 L O2 via [place] on admission.  CXR shows increased left basilar pneumonia and/or effusion. Continue empiric antibiotics with IV ceftriaxone and azithromycin (2/7-2/11) Follow blood cultures (NGx5), sputum culture (not collected), strep pneumonia urinary antigen - negative CT without PE, but did have collapse/consolidative changes in lingula and LLL with attenuation of LLL airway.  Moderate L effusion and trace R effusion.   Now s/p thora, exudative effusion Follow culture and cytology outpatient with [name] to intermittently require 1-2 L, but currently on 1 L  Exudative pleural effusion Follow labs Will need follow up with [name] outpatient, follow cytology and repeat chest imaging Culture pending, gram stain without organisms  Cellulitis of right lower extremity- (present on admission) Improving with oral antibiotics per family.   Seems improved, s/p 5 days abx here Follow outpatient  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 [facility])- (present on admission) Follows with hematology, [name].  Has been receiving Rituxan and frequent PRBC transfusions.  Received 1 unit PRBC at cancer center day of admission on 10/30/2021. Hemoglobin 10.8 on admission Stable to 9 today at discharge Appreciate oncology assistance (2/10 note) - follow up with  [name] after discharge to reevaluate resuming rituximab, B12 3845 mcg daily, folic acid 2 mg  **Would recommend CBC and all hemolytic labs to be collected in prewarmed tubes at 37 degrees and samples to be run at 37C to get accurate results** **The patient needs to be kept in [name] warm room and be dressed and warm cloths from head to toe limiting any open skin**. **All IV fluids and blood products need to be given through [name] blood warmer**   Abnormal CT scan Needs outpatient follow up - CT chest with concern for retroperitoneal lymphadenopathy -> CT abd pelvis recommended and did not show abdominopelvic adenopathy  Of note, collapse/consolidative changes in lingula and left lower lobe with attenuation of left lower lobe airway, needs close follow up central obstructing neoplasm can't be excluded  Fecal impaction (HCC) She's having bowel movements Continue bowel regimen at discharge  Dementia [facility])- (present on admission) Chronic dementia at baseline.  Continue Seroquel and delirium precautions.  Pulmonary nodule Needs outpatient follow up   Procedures: LE [place] Summary:  RIGHT:  - There is no evidence of deep vein thrombosis in the lower extremity.  However, portions of this examination were limited- see technologist  comments above.     - No cystic structure found in the popliteal fossa.     LEFT:  - There is no evidence of deep vein thrombosis in the lower extremity.     - No cystic structure found in the popliteal fossa.    IR thoracentesis  Consultations: Oncology IR  Discharge Exam: Vitals:   11/06/21 0600 11/06/21 0604  BP: (!) 112/36   Pulse: (!) 54   Resp: 14   Temp: 98.2 F (36.8 C)   SpO2: (!) 84% 97%   Pleasantly confused Discussed with sister, [name] regarding d/c plan  General: No acute distress. Cardiovascular: RRR Lungs: unlabored Abdomen: Soft, nontender, nondistended  Neurological: pleasantly confused, moving all extremities Skin: mild  redness to LE's, cellulitis appears improved Extremities: No clubbing or cyanosis. No edema.  Discharge Instructions   Discharge Instructions     Call MD for:  difficulty breathing, headache or visual disturbances   Complete by: As directed    Call MD for:  extreme fatigue   Complete by: As directed    Call MD for:  hives   Complete by: As directed    Call MD for:  persistant dizziness or light-headedness   Complete by: As directed    Call MD for:  persistant nausea and vomiting   Complete by: As directed    Call MD for:  redness, tenderness, or signs of infection (pain, swelling, redness, odor or green/yellow discharge around incision site)   Complete by: As directed    Call MD for:  severe uncontrolled pain   Complete by: As directed    Call MD for:  temperature >100.4   Complete by: As directed    Diet - low sodium heart healthy   Complete by: As directed    Discharge instructions   Complete by: As directed    You were seen for pneumonia.  You've gradually improved with antibiotics.  Follow up Bennet Kujawa repeat chest x ray with your PCP outpatient.   You had an abnormal CT scan and fluid on your lungs.  The final results of the  extra fluid on your lung should be followed up with [name] (follow up the cytology and final culture results).  You'll need repeat imaging to follow concern for Xzavior Reinig possible obstructing neoplasm and Briele Lagasse 9 mm pulmonary nodule.    You'll discharge with oxygen as needed.  You've fluctuated between needed no extra oxygen and 1-2 liters as needed.   You had Leandrea Ackley fecal impaction and should discharge on Bowdy Bair bowel regimen.  We'll send you back to the skilled nursing facility today.  Please follow up with [name] as planned and continue to discuss goals of care and future treatment.  Return for new, recurrent, or worsening symptoms.  Please ask your PCP to request records from this hospitalization so they know what was done and what the next steps will be.   Increase  activity slowly   Complete by: As directed    No wound care   Complete by: As directed       Allergies as of 11/06/2021       Reactions   Other Other (See Comments)   "Most antibiotics cause yeast infections"   Shellfish Allergy Hives        Medication List     STOP taking these medications    amoxicillin-clavulanate 875-125 MG tablet Commonly known as: AUGMENTIN   doxycycline 100 MG tablet Commonly known as: VIBRA-TABS   melatonin 3 MG Tabs tablet   saccharomyces boulardii 250 MG capsule Commonly known as: FLORASTOR   traMADol 50 MG tablet Commonly known as: ULTRAM       TAKE these medications    acetaminophen 500 MG tablet Commonly known as: TYLENOL Take 500 mg by mouth every 6 (six) hours as needed for moderate pain.   aspirin EC 325 MG tablet Take 1 tablet (325 mg total) by mouth daily. X 5 weeks for DVT prophylaxis What changed: additional instructions   cyanocobalamin 2000 MCG tablet Take 1 tablet (2,000 mcg total) by mouth daily.   docusate sodium 100 MG capsule Commonly known as: COLACE Take 1 capsule (100 mg total) by mouth 2 (two) times daily.   folic acid 1 MG tablet Commonly known as: FOLVITE Take 2 tablets (2 mg total) by mouth daily.   loratadine 10 MG tablet Commonly known as: CLARITIN Take 10 mg by mouth daily as needed for allergies.   multivitamin with minerals Tabs tablet Take 1 tablet by mouth daily.   OXYGEN Inhale 2 L into the lungs See admin instructions. To maintain SAT greater than 90%   pantoprazole 40 MG tablet Commonly known as: PROTONIX Take 1 tablet (40 mg total) by mouth daily at 6 (six) AM.   polyethylene glycol 17 g packet Commonly known as: MiraLax Take 17 g by mouth daily.   PRESCRIPTION MEDICATION Take 90 mLs by mouth daily. Med Pass 2.0   QUEtiapine 25 MG tablet Commonly known as: SEROquel Take 1 tablet (25 mg total) by mouth at bedtime.   sertraline 25 MG tablet Commonly known as: ZOLOFT Take  25 mg by mouth daily.       Allergies  Allergen Reactions   Other Other (See Comments)    "Most antibiotics cause yeast infections"   Shellfish Allergy Hives      The results of significant diagnostics from this hospitalization (including imaging, microbiology, ancillary and laboratory) are listed below for reference.    Significant Diagnostic Studies: DG Chest 1 View  Result Date: 11/05/2021 CLINICAL DATA:  Status post left thoracentesis EXAM: CHEST  1 VIEW  COMPARISON:  10/30/2021 FINDINGS: No significant change in moderate left pleural effusion associated atelectasis or consolidation. No pneumothorax. The right lung is normally aerated. Cardiomegaly. IMPRESSION: No significant change in moderate left pleural effusion associated atelectasis or consolidation. No pneumothorax. Electronically Signed   By: Delanna Ahmadi M.D.   On: 11/05/2021 16:21   CT Angio Chest Pulmonary Embolism (PE) W or WO Contrast  Result Date: 11/04/2021 CLINICAL DATA:  Chest pain. EXAM: CT ANGIOGRAPHY CHEST WITH CONTRAST TECHNIQUE: Multidetector CT imaging of the chest was performed using the standard protocol during bolus administration of intravenous contrast. Multiplanar CT image reconstructions and MIPs were obtained to evaluate the vascular anatomy. RADIATION DOSE REDUCTION: This exam was performed according to the departmental dose-optimization program which includes automated exposure control, adjustment of the mA and/or kV according to patient size and/or use of iterative reconstruction technique. CONTRAST:  51mL OMNIPAQUE IOHEXOL 350 MG/ML SOLN COMPARISON:  Chest CT without contrast 08/13/2019 FINDINGS: Cardiovascular: The heart size is normal. No substantial pericardial effusion. Mild atherosclerotic calcification is noted in the wall of the thoracic aorta. There is no filling defect within the opacified pulmonary arteries to suggest the presence of an acute pulmonary embolus. Mediastinum/Nodes: No mediastinal  lymphadenopathy. There is no hilar lymphadenopathy. The esophagus has normal imaging features. There is no axillary lymphadenopathy. Lungs/Pleura: Centrilobular and paraseptal emphysema evident. Collapse/consolidative changes seen in the lingula and left lower lobe with attenuation of the left lower lobe airway visible on 59/12 and lingular airway on 62/12. Moderate left pleural effusion evident. 9 mm pulmonary nodule identified posterior right upper lobe on image 41/12. Minimal atelectasis noted posterior right lung base with trace right pleural effusion. Upper Abdomen: Soft tissue fullness noted in the para-aortic space of the upper abdomen (see image 83/6 and 87/6, features raising concern for retroperitoneal lymphadenopathy. Musculoskeletal: Bones are diffusely demineralized nonacute left-sided rib fractures evident. Status post vertebral augmentation at T12 with un treated T11 compression fracture noted. There is Theodore Rahrig comminuted fracture of the lower sternum, new since 08/13/2019 but associated bony callus suggests nonacute injury. Review of the MIP images confirms the above findings. IMPRESSION: 1. No CT evidence for acute pulmonary embolus. 2. Collapse/consolidative changes in the lingula and left lower lobe with attenuation of the left lower lobe airway. Close follow-up recommended as central obstructing neoplasm cannot be excluded. 3. Moderate left pleural effusion with trace right pleural effusion. 4. 9 mm pulmonary nodule posterior right upper lobe. Attention on follow-up recommended. 5. Soft tissue fullness in the para-aortic space of the upper abdomen, features raising concern for retroperitoneal lymphadenopathy. CT abdomen and pelvis with intravenous contrast suggested to further evaluate. 6. Diffuse osteopenia comminuted subacute sternal fracture and T11 superior endplate compression fracture with evidence of prior augmentation at T12. 7. Aortic Atherosclerosis (ICD10-I70.0) and Emphysema (ICD10-J43.9).  Electronically Signed   By: Misty Stanley M.D.   On: 11/04/2021 17:54   CT ABDOMEN PELVIS W CONTRAST  Result Date: 11/05/2021 CLINICAL DATA:  Follow-up retroperitoneal adenopathy. Soft tissue fullness in the periaortic space. EXAM: CT ABDOMEN AND PELVIS WITH CONTRAST TECHNIQUE: Multidetector CT imaging of the abdomen and pelvis was performed using the standard protocol following bolus administration of intravenous contrast. RADIATION DOSE REDUCTION: This exam was performed according to the departmental dose-optimization program which includes automated exposure control, adjustment of the mA and/or kV according to patient size and/or use of iterative reconstruction technique. CONTRAST:  91mL OMNIPAQUE IOHEXOL 300 MG/ML  SOLN COMPARISON:  CTA chest of 1 day prior. Abdominopelvic CT of 08/09/2021  FINDINGS: Lower chest: Left lower lobe consolidation. Persistent small left and trace right pleural effusions. Cardiomegaly. Hepatobiliary: Mildly irregular hepatic capsule. Small gallstones without acute cholecystitis or biliary duct dilatation. Pancreas: Normal for age, with atrophy but no duct dilatation. Spleen: Normal in size, without focal abnormality. Adrenals/Urinary Tract: Bilateral adrenal thickening. Contrast within bilateral collecting systems presumably from yesterday's CT. No hydronephrosis. Normal urinary bladder. Stomach/Bowel: Proximal gastric underdistention. 8.3 cm stool ball in the rectum. Normal small bowel. Vascular/Lymphatic: Advanced aortic and branch vessel atherosclerosis. No abdominopelvic adenopathy. The abnormality on chest CTA was likely due to prominent diaphragmatic crura. Reproductive: Hysterectomy. 1.6 cm left ovarian residual follicle or cyst. Other: No significant free fluid.  No free intraperitoneal air. Musculoskeletal: Bilateral proximal femur fixation. Osteopenia. Vertebral augmentation at T12. Multiple thoracolumbar compression deformities are similar, without ventral canal  encroachment. IMPRESSION: 1. No abdominopelvic adenopathy. The CTA chest finding was likely due to prominent diaphragmatic crura. 2. Stool ball in the rectum suggests fecal impaction. 3. Left larger than right pleural effusions with left base consolidation, similar to yesterday. 4. Cholelithiasis 5. Irregular hepatic capsule. Cannot exclude cirrhosis. Correlate with risk factors. Electronically Signed   By: Abigail Miyamoto M.D.   On: 11/05/2021 15:27   DG Chest Port 1 View  Result Date: 10/30/2021 CLINICAL DATA:  Short of breath, respiratory distress EXAM: PORTABLE CHEST 1 VIEW COMPARISON:  10/03/2021 FINDINGS: Single frontal view of the chest was obtained with the patient rotated markedly toward the left. The cardiac silhouette is stable. There is chronic elevation of the left hemidiaphragm, with increased left basilar consolidation and effusion since prior exam. Minimal hypoventilatory changes at the right base. No pneumothorax. No acute bony abnormalities. IMPRESSION: 1. Progressive left basilar consolidation and/or effusion. Electronically Signed   By: Randa Ngo M.D.   On: 10/30/2021 15:33   VAS [place] LOWER EXTREMITY VENOUS (DVT)  Result Date: 10/31/2021  Lower Venous DVT Study Patient Name:  PARNIKA TWETEN  Date of Exam:   10/30/2021 Medical Rec #: [id number]             Accession #:    [id number] Date of Birth: 07/08/1941            Patient Gender: F Patient Age:   29 years Exam Location:  [facility] Procedure:      VAS [place] LOWER EXTREMITY VENOUS (DVT) Referring Phys: Sullivan Lone --------------------------------------------------------------------------------  Indications: Swelling.  Risk Factors: Cancer. Limitations: Poor ultrasound/tissue interface. Comparison Study: No prior studies. Performing Technologist: Oliver Hum RVT  Examination Guidelines: Aiva Miskell complete evaluation includes B-mode imaging, spectral Doppler, color Doppler, and power Doppler as needed of all accessible portions of  each vessel. Bilateral testing is considered an integral part of Dineen Conradt complete examination. Limited examinations for reoccurring indications may be performed as noted. The reflux portion of the exam is performed with the patient in reverse Trendelenburg.  +---------+---------------+---------+-----------+----------+--------------+  RIGHT     Compressibility Phasicity Spontaneity Properties Thrombus Aging  +---------+---------------+---------+-----------+----------+--------------+  CFV       Full            Yes       Yes                                    +---------+---------------+---------+-----------+----------+--------------+  SFJ       Full                                                             +---------+---------------+---------+-----------+----------+--------------+  FV Prox   Full                                                             +---------+---------------+---------+-----------+----------+--------------+  FV Mid    Full                                                             +---------+---------------+---------+-----------+----------+--------------+  FV Distal Full                                                             +---------+---------------+---------+-----------+----------+--------------+  PFV       Full                                                             +---------+---------------+---------+-----------+----------+--------------+  POP       Full            Yes       Yes                                    +---------+---------------+---------+-----------+----------+--------------+  PTV       Full                                                             +---------+---------------+---------+-----------+----------+--------------+  PERO      Full                                                             +---------+---------------+---------+-----------+----------+--------------+   +---------+---------------+---------+-----------+----------+--------------+  LEFT       Compressibility Phasicity Spontaneity Properties Thrombus Aging  +---------+---------------+---------+-----------+----------+--------------+  CFV       Full            Yes       Yes                                    +---------+---------------+---------+-----------+----------+--------------+  SFJ       Full                                                             +---------+---------------+---------+-----------+----------+--------------+  FV Prox   Full                                                             +---------+---------------+---------+-----------+----------+--------------+  FV Mid    Full                                                             +---------+---------------+---------+-----------+----------+--------------+  FV Distal Full                                                             +---------+---------------+---------+-----------+----------+--------------+  PFV       Full                                                             +---------+---------------+---------+-----------+----------+--------------+  POP       Full            Yes       Yes                                    +---------+---------------+---------+-----------+----------+--------------+  PTV       Full                                                             +---------+---------------+---------+-----------+----------+--------------+  PERO      Full                                                             +---------+---------------+---------+-----------+----------+--------------+     Summary: RIGHT: - There is no evidence of deep vein thrombosis in the lower extremity. However, portions of this examination were limited- see technologist comments above.  - No cystic structure found in the popliteal fossa.  LEFT: - There is no evidence of deep vein thrombosis in the lower extremity.  - No cystic structure found in the popliteal fossa.  *See table(s) above for measurements and observations. Electronically signed  by Harold Barban MD on 10/31/2021 at 8:57:06 PM.    Final    US THORACENTESIS ASP PLEURAL SPACE W/IMG GUIDE  Result Date: 11/05/2021 INDICATION: Patient with history of hemolytic anemia, COPD, dementia, dyspnea, left pleural effusion. Request received for diagnostic and therapeutic left thoracentesis. EXAM: ULTRASOUND GUIDED DIAGNOSTIC  AND THERAPEUTIC LEFT THORACENTESIS MEDICATIONS: 8 mL 1% lidocaine COMPLICATIONS: None immediate. PROCEDURE: An ultrasound guided thoracentesis was thoroughly discussed with the patient/patient's sister and questions answered. The benefits, risks, alternatives and complications were also discussed. The patient understands and wishes to proceed with the procedure. Written consent was obtained. Ultrasound was performed to localize and mark an adequate pocket of fluid in the left chest. The area was then prepped and draped in the normal sterile fashion. 1% Lidocaine was used for local anesthesia. Under ultrasound guidance Shamell Suarez 6 Fr Safe-T-Centesis catheter was introduced. Thoracentesis was performed. The catheter was removed and Carder Yin dressing applied. FINDINGS: Rilen Shukla total of approximately 175 cc of yellow fluid was removed. Samples were sent to the laboratory as requested by the clinical team. IMPRESSION: Successful ultrasound guided diagnostic and therapeutic left thoracentesis yielding 175 cc of pleural fluid. Read by: Rowe Robert, PA-C Electronically Signed   By: Ruthann Cancer M.D.   On: 11/05/2021 16:06    Microbiology: Recent Results (from the past 240 hour(s))  Blood culture (routine x 2)     Status: None   Collection Time: 10/30/21  4:32 PM   Specimen: BLOOD RIGHT FOREARM  Result Value Ref Range Status   Specimen Description   Final    BLOOD RIGHT FOREARM Performed at [facility] [address] [place], [place] [address]    Special Requests   Final    BOTTLES DRAWN AEROBIC AND ANAEROBIC Blood Culture adequate volume Performed at [facility] [address], [place], [place] [address]    Culture   Final    NO GROWTH 5 DAYS Performed at [facility], [place] [address] [place], [place] [address]    Report Status 11/04/2021 FINAL  Final  Blood culture (routine x 2)     Status: None   Collection Time: 10/30/21  4:45 PM   Specimen: BLOOD  Result Value Ref Range Status   Specimen Description   Final    BLOOD RIGHT ANTECUBITAL Performed at [facility] [address], [place], [place] [address]    Special Requests   Final    BOTTLES DRAWN AEROBIC AND ANAEROBIC Blood Culture results may not be optimal due to an inadequate volume of blood received in culture bottles Performed at [facility] [address], [place], [place] [address]    Culture   Final    NO GROWTH 5 DAYS Performed at [facility], [place] [address] [place], [place] [address]    Report Status 11/04/2021 FINAL  Final  Resp Panel by RT-PCR (Flu Tarahji Ramthun&B, Covid) Nasopharyngeal Swab     Status: None   Collection Time: 10/30/21  5:02 PM   Specimen: Nasopharyngeal Swab; Nasopharyngeal(NP) swabs in vial transport medium  Result Value Ref Range Status   SARS Coronavirus 2 by RT PCR NEGATIVE NEGATIVE Final    Comment: (NOTE) SARS-CoV-2 target nucleic acids are NOT DETECTED.  The SARS-CoV-2 RNA is generally detectable in upper respiratory specimens during the acute phase of infection. The lowest concentration of SARS-CoV-2 viral copies this assay can detect is 138 copies/mL. Exie Chrismer negative result does not preclude SARS-Cov-2 infection and should not be used as the sole basis for treatment or other patient management decisions. Oluwafemi Villella negative result may occur with  improper specimen collection/handling, submission of specimen other than nasopharyngeal swab, presence of viral mutation(s) within the areas targeted by this assay, and inadequate number of viral copies(<138 copies/mL). Bhavana Kady negative result must  be combined  with clinical observations, patient history, and epidemiological information. The expected result is Negative.  Fact Sheet for Patients:  EntrepreneurPulse.com.au  Fact Sheet for Healthcare Providers:  IncredibleEmployment.be  This test is no t yet approved or cleared by the [place] FDA and  has been authorized for detection and/or diagnosis of SARS-CoV-2 by FDA under an Emergency Use Authorization (EUA). This EUA will remain  in effect (meaning this test can be used) for the duration of the COVID-19 declaration under Section 564(b)(1) of the Act, 21 U.S.C.section 360bbb-3(b)(1), unless the authorization is terminated  or revoked sooner.       Influenza Maythe Deramo by PCR NEGATIVE NEGATIVE Final   Influenza B by PCR NEGATIVE NEGATIVE Final    Comment: (NOTE) The Xpert Xpress SARS-CoV-2/FLU/RSV plus assay is intended as an aid in the diagnosis of influenza from Nasopharyngeal swab specimens and should not be used as Fergus Throne sole basis for treatment. Nasal washings and aspirates are unacceptable for Xpert Xpress SARS-CoV-2/FLU/RSV testing.  Fact Sheet for Patients: EntrepreneurPulse.com.au  Fact Sheet for Healthcare Providers: IncredibleEmployment.be  This test is not yet approved or cleared by the [place] FDA and has been authorized for detection and/or diagnosis of SARS-CoV-2 by FDA under an Emergency Use Authorization (EUA). This EUA will remain in effect (meaning this test can be used) for the duration of the COVID-19 declaration under Section 564(b)(1) of the Act, 21 U.S.C. section 360bbb-3(b)(1), unless the authorization is terminated or revoked.  Performed at [facility], [place] [address] [place], [place] [address]   Resp Panel by RT-PCR (Flu Cisco Kindt&B, Covid) Nasopharyngeal Swab     Status: None   Collection Time: 11/04/21 11:39 AM   Specimen: Nasopharyngeal Swab; Nasopharyngeal(NP)  swabs in vial transport medium  Result Value Ref Range Status   SARS Coronavirus 2 by RT PCR NEGATIVE NEGATIVE Final    Comment: (NOTE) SARS-CoV-2 target nucleic acids are NOT DETECTED.  The SARS-CoV-2 RNA is generally detectable in upper respiratory specimens during the acute phase of infection. The lowest concentration of SARS-CoV-2 viral copies this assay can detect is 138 copies/mL. Mckynna Vanloan negative result does not preclude SARS-Cov-2 infection and should not be used as the sole basis for treatment or other patient management decisions. Shravya Wickwire negative result may occur with  improper specimen collection/handling, submission of specimen other than nasopharyngeal swab, presence of viral mutation(s) within the areas targeted by this assay, and inadequate number of viral copies(<138 copies/mL). Camdyn Beske negative result must be combined with clinical observations, patient history, and epidemiological information. The expected result is Negative.  Fact Sheet for Patients:  EntrepreneurPulse.com.au  Fact Sheet for Healthcare Providers:  IncredibleEmployment.be  This test is no t yet approved or cleared by the [place] FDA and  has been authorized for detection and/or diagnosis of SARS-CoV-2 by FDA under an Emergency Use Authorization (EUA). This EUA will remain  in effect (meaning this test can be used) for the duration of the COVID-19 declaration under Section 564(b)(1) of the Act, 21 U.S.C.section 360bbb-3(b)(1), unless the authorization is terminated  or revoked sooner.       Influenza Emersynn Deatley by PCR NEGATIVE NEGATIVE Final   Influenza B by PCR NEGATIVE NEGATIVE Final    Comment: (NOTE) The Xpert Xpress SARS-CoV-2/FLU/RSV plus assay is intended as an aid in the diagnosis of influenza from Nasopharyngeal swab specimens and should not be used as Zeniyah Peaster sole basis for treatment. Nasal washings and aspirates are unacceptable for Xpert Xpress  SARS-CoV-2/FLU/RSV testing.  Fact  Sheet for Patients: EntrepreneurPulse.com.au  Fact Sheet for Healthcare Providers: IncredibleEmployment.be  This test is not yet approved or cleared by the [place] FDA and has been authorized for detection and/or diagnosis of SARS-CoV-2 by FDA under an Emergency Use Authorization (EUA). This EUA will remain in effect (meaning this test can be used) for the duration of the COVID-19 declaration under Section 564(b)(1) of the Act, 21 U.S.C. section 360bbb-3(b)(1), unless the authorization is terminated or revoked.  Performed at [facility], [place] [address] [place], [place] [address]   Body fluid culture w Gram Stain     Status: None (Preliminary result)   Collection Time: 11/05/21  3:54 PM   Specimen: PATH Cytology Pleural fluid  Result Value Ref Range Status   Specimen Description   Final    FLUID LEFT PLEURAL Performed at [facility] [address] [place], [place] [address]    Special Requests   Final    NONE Performed at [facility], [place] [address], [place], [place] [address]    Gram Stain   Final    FEW WBC PRESENT, PREDOMINANTLY MONONUCLEAR NO ORGANISMS SEEN    Culture   Final    NO GROWTH < 24 HOURS Performed at [facility] [address] [place], [place] [address]    Report Status PENDING  Incomplete     Labs: Basic Metabolic Panel: Recent Labs  Lab 10/31/21 0535 11/01/21 0616 11/02/21 0544 11/04/21 0542 11/06/21 0543  NA 135 136 136 137 133*  K 3.4* 3.2* 4.9 4.1 4.1  CL 98 99 98 98 96*  CO2 28 31 31  34* 29  GLUCOSE 84 77 77 76 71  BUN 20 14 10  7* 8  CREATININE 0.39* 0.46 <0.30* 0.38* 0.34*  CALCIUM 7.4* 7.4* 7.9* 8.1* 8.0*  MG  --  1.4* 2.4 2.0 2.1  PHOS  --  2.4* 2.0* 3.5 2.3*   Liver Function Tests: Recent Labs  Lab 10/30/21 1510 11/01/21 0616 11/02/21 0544 11/04/21 0542 11/06/21 0543  AST 51*  26 22 24  14*  ALT 18 12 13 14 12   ALKPHOS 174* 140* 155* 171* 129*  BILITOT 2.9* 0.7 0.7 0.9 0.3  PROT 6.0* 5.0* 5.3* 5.5* 5.4*  ALBUMIN 3.2* 2.7* 2.9* 3.0* 3.2*   No results for input(s): LIPASE, AMYLASE in the last 168 hours. No results for input(s): AMMONIA in the last 168 hours. CBC: Recent Labs  Lab 11/01/21 0616 11/02/21 0544 11/03/21 1804 11/04/21 0542 11/06/21 0543  WBC 8.8 9.2 9.4 7.0 11.1*  NEUTROABS 5.8 5.9  --  4.3 8.5*  HGB 9.1* 8.4* 8.1* 8.2* 9.0*  HCT 27.3* 26.2* 24.5* 25.3* 27.6*  MCV 92.5 95.3 94.2 94.1 93.6  PLT 501* 515* 588* 458* 708*   Cardiac Enzymes: No results for input(s): CKTOTAL, CKMB, CKMBINDEX, TROPONINI in the last 168 hours. BNP: BNP (last 3 results) No results for input(s): BNP in the last 8760 hours.  ProBNP (last 3 results) No results for input(s): PROBNP in the last 8760 hours.  CBG: No results for input(s): GLUCAP in the last 168 hours.     Signed:  Fayrene Helper MD.  [facility] 11/06/2021, 12:02 PM

## 2021-11-07 ENCOUNTER — Ambulatory Visit: Payer: Medicare Other

## 2021-11-08 ENCOUNTER — Inpatient Hospital Stay: Payer: Medicare Other

## 2021-11-08 LAB — CYTOLOGY - NON PAP

## 2021-11-09 LAB — BODY FLUID CULTURE W GRAM STAIN: Culture: NO GROWTH

## 2021-11-12 ENCOUNTER — Telehealth: Payer: Self-pay

## 2021-11-12 ENCOUNTER — Inpatient Hospital Stay: Payer: Medicare Other

## 2021-11-12 ENCOUNTER — Other Ambulatory Visit: Payer: Self-pay

## 2021-11-12 DIAGNOSIS — Z5111 Encounter for antineoplastic chemotherapy: Secondary | ICD-10-CM | POA: Diagnosis not present

## 2021-11-12 DIAGNOSIS — D5912 Cold autoimmune hemolytic anemia: Secondary | ICD-10-CM

## 2021-11-12 DIAGNOSIS — E559 Vitamin D deficiency, unspecified: Secondary | ICD-10-CM | POA: Diagnosis not present

## 2021-11-12 DIAGNOSIS — Z87891 Personal history of nicotine dependence: Secondary | ICD-10-CM | POA: Diagnosis not present

## 2021-11-12 DIAGNOSIS — Z79899 Other long term (current) drug therapy: Secondary | ICD-10-CM | POA: Diagnosis not present

## 2021-11-12 DIAGNOSIS — J449 Chronic obstructive pulmonary disease, unspecified: Secondary | ICD-10-CM | POA: Diagnosis not present

## 2021-11-12 DIAGNOSIS — Z86010 Personal history of colonic polyps: Secondary | ICD-10-CM | POA: Diagnosis not present

## 2021-11-12 DIAGNOSIS — I700 Atherosclerosis of aorta: Secondary | ICD-10-CM | POA: Diagnosis not present

## 2021-11-12 DIAGNOSIS — I6523 Occlusion and stenosis of bilateral carotid arteries: Secondary | ICD-10-CM | POA: Diagnosis not present

## 2021-11-12 DIAGNOSIS — D472 Monoclonal gammopathy: Secondary | ICD-10-CM | POA: Diagnosis not present

## 2021-11-12 DIAGNOSIS — E538 Deficiency of other specified B group vitamins: Secondary | ICD-10-CM | POA: Diagnosis not present

## 2021-11-12 LAB — CMP (CANCER CENTER ONLY)
ALT: 16 U/L (ref 0–44)
AST: 29 U/L (ref 15–41)
Albumin: 3.7 g/dL (ref 3.5–5.0)
Alkaline Phosphatase: 234 U/L — ABNORMAL HIGH (ref 38–126)
Anion gap: 6 (ref 5–15)
BUN: 13 mg/dL (ref 8–23)
CO2: 34 mmol/L — ABNORMAL HIGH (ref 22–32)
Calcium: 8.7 mg/dL — ABNORMAL LOW (ref 8.9–10.3)
Chloride: 97 mmol/L — ABNORMAL LOW (ref 98–111)
Creatinine: 0.39 mg/dL — ABNORMAL LOW (ref 0.44–1.00)
GFR, Estimated: >60 mL/min (ref >60)
Glucose, Bld: 35 mg/dL — CL (ref 70–99)
Potassium: 3.7 mmol/L (ref 3.5–5.1)
Sodium: 137 mmol/L (ref 135–145)
Total Bilirubin: 2.2 mg/dL — ABNORMAL HIGH (ref 0.3–1.2)
Total Protein: 6.4 g/dL — ABNORMAL LOW (ref 6.5–8.1)

## 2021-11-12 LAB — CBC WITH DIFFERENTIAL (CANCER CENTER ONLY)
Abs Immature Granulocytes: 0.1 10*3/uL — ABNORMAL HIGH (ref 0.00–0.07)
Basophils Absolute: 0.1 10*3/uL (ref 0.0–0.1)
Basophils Relative: 1 %
Eosinophils Absolute: 0 10*3/uL (ref 0.0–0.5)
Eosinophils Relative: 0 %
HCT: 22.7 % — ABNORMAL LOW (ref 36.0–46.0)
Hemoglobin: 7.4 g/dL — ABNORMAL LOW (ref 12.0–15.0)
Immature Granulocytes: 1 %
Lymphocytes Relative: 15 %
Lymphs Abs: 2.4 10*3/uL (ref 0.7–4.0)
MCH: 30.1 pg (ref 26.0–34.0)
MCHC: 32.6 g/dL (ref 30.0–36.0)
MCV: 92.3 fL (ref 80.0–100.0)
Monocytes Absolute: 0.8 10*3/uL (ref 0.1–1.0)
Monocytes Relative: 5 %
Neutro Abs: 12.3 10*3/uL — ABNORMAL HIGH (ref 1.7–7.7)
Neutrophils Relative %: 78 %
Platelet Count: 1084 10*3/uL (ref 150–400)
RBC: 2.46 MIL/uL — ABNORMAL LOW (ref 3.87–5.11)
RDW: 18.6 % — ABNORMAL HIGH (ref 11.5–15.5)
WBC Count: 15.8 10*3/uL — ABNORMAL HIGH (ref 4.0–10.5)
nRBC: 0 % (ref 0.0–0.2)

## 2021-11-12 LAB — PREPARE RBC (CROSSMATCH)

## 2021-11-12 LAB — SAMPLE TO BLOOD BANK

## 2021-11-12 NOTE — Telephone Encounter (Signed)
CRITICAL VALUE STICKER  CRITICAL VALUE: PLT = 1084 and Hgb = 7.4  RECEIVER (on-site recipient of call): Yetta Glassman, [place] NOTIFIED: 11/12/21 at 10:02am  MESSENGER (representative from lab): Pam  MD NOTIFIED: Irene Limbo  TIME OF NOTIFICATION: 11/12/21 at 10:10am  RESPONSE: Notification given to Ilda Foil., RN for follow-up with pt and provider.

## 2021-11-12 NOTE — Telephone Encounter (Signed)
CRITICAL VALUE STICKER  CRITICAL VALUE: Glucose = 35  RECEIVER (on-site recipient of call): Yetta Glassman, CMA  DATE & TIME NOTIFIED: 11/12/21 at 10:39am  MESSENGER (representative from lab): Rosann Auerbach  MD NOTIFIED: Irene Limbo  TIME OF NOTIFICATION: 11/12/21 at 10:43am  RESPONSE: Notifcation given to Ilda Foil., RN for follow-up with pt and provider.

## 2021-11-12 NOTE — Progress Notes (Signed)
Called [facility] and spoke to [employer]. Reported Pt' s CBG from labs done at [facility] is 44. RN stated she would assess pt right away and get her /assist her to eat and drink.  Also notified pt's sister of lab results and that pt would need to come in tomorrow for blood. Orders placed for blood.

## 2021-11-13 ENCOUNTER — Inpatient Hospital Stay: Payer: Medicare Other

## 2021-11-13 ENCOUNTER — Inpatient Hospital Stay (HOSPITAL_BASED_OUTPATIENT_CLINIC_OR_DEPARTMENT_OTHER): Payer: Medicare Other | Admitting: Hematology

## 2021-11-13 DIAGNOSIS — D5912 Cold autoimmune hemolytic anemia: Secondary | ICD-10-CM | POA: Diagnosis not present

## 2021-11-13 DIAGNOSIS — D649 Anemia, unspecified: Secondary | ICD-10-CM

## 2021-11-13 MED ORDER — ACETAMINOPHEN 325 MG PO TABS
650.0000 mg | ORAL_TABLET | Freq: Once | ORAL | Status: AC
  Administered 2021-11-13: 650 mg via ORAL
  Filled 2021-11-13: qty 2

## 2021-11-13 MED ORDER — SODIUM CHLORIDE 0.9% IV SOLUTION
250.0000 mL | Freq: Once | INTRAVENOUS | Status: AC
  Administered 2021-11-13: 250 mL via INTRAVENOUS

## 2021-11-13 MED ORDER — METHYLPREDNISOLONE SODIUM SUCC 40 MG IJ SOLR
40.0000 mg | Freq: Once | INTRAMUSCULAR | Status: AC
  Administered 2021-11-13: 40 mg via INTRAVENOUS
  Filled 2021-11-13: qty 1

## 2021-11-13 NOTE — Patient Instructions (Signed)
Blood Transfusion, Adult, Care After This sheet gives you information about how to care for yourself after your procedure. Your doctor may also give you more specific instructions. If you have problems or questions, contact your doctor. What can I expect after the procedure? After the procedure, it is common to have: Bruising and soreness at the IV site. A headache. Follow these instructions at home: Insertion site care   Follow instructions from your doctor about how to take care of your insertion site. This is where an IV tube was put into your vein. Make sure you: Wash your hands with soap and water before and after you change your bandage (dressing). If you cannot use soap and water, use hand sanitizer. Change your bandage as told by your doctor. Check your insertion site every day for signs of infection. Check for: Redness, swelling, or pain. Bleeding from the site. Warmth. Pus or a bad smell. General instructions Take over-the-counter and prescription medicines only as told by your doctor. Rest as told by your doctor. Go back to your normal activities as told by your doctor. Keep all follow-up visits as told by your doctor. This is important. Contact a doctor if: You have itching or red, swollen areas of skin (hives). You feel worried or nervous (anxious). You feel weak after doing your normal activities. You have redness, swelling, warmth, or pain around the insertion site. You have blood coming from the insertion site, and the blood does not stop with pressure. You have pus or a bad smell coming from the insertion site. Get help right away if: You have signs of a serious reaction. This may be coming from an allergy or the body's defense system (immune system). Signs include: Trouble breathing or shortness of breath. Swelling of the face or feeling warm (flushed). Fever or chills. Head, chest, or back pain. Dark pee (urine) or blood in the pee. Widespread rash. Fast  heartbeat. Feeling dizzy or light-headed. You may receive your blood transfusion in an outpatient setting. If so, you will be told whom to contact to report any reactions. These symptoms may be an emergency. Do not wait to see if the symptoms will go away. Get medical help right away. Call your local emergency services (911 in the [place] Do not drive yourself to the hospital. Summary Bruising and soreness at the IV site are common. Check your insertion site every day for signs of infection. Rest as told by your doctor. Go back to your normal activities as told by your doctor. Get help right away if you have signs of a serious reaction. This information is not intended to replace advice given to you by your health care provider. Make sure you discuss any questions you have with your health care provider. Document Revised: 01/04/2021 Document Reviewed: 03/04/2019 Elsevier Patient Education  2022 [employer]  

## 2021-11-13 NOTE — Progress Notes (Signed)
.. 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  She is still at skilled nursing facility undergoing physical therapy for her fractured hip.  She denies shortness of breath, chest pain, bowel disturbances, dizziness. She reports she is not eating much.  Since her last visit, she was admitted to the hospital for pneumonia. CT angio chest on 11/04/2021 showed a 9 mm RUL pulmonary nodule and soft tissue fullness in para-aortic space of upper abdomen. This prompted CT A/P showing no abdominopelvic adenopathy.   REVIEW OF SYSTEMS:   10 Point review of Systems was done is negative except as noted above.   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 Past Surgical History:  Procedure Laterality Date   ABDOMINAL HYSTERECTOMY  1986   APPENDECTOMY  1967   BREAST EXCISIONAL BIOPSY Right 1984   CATARACT EXTRACTION W/ INTRAOCULAR LENS IMPLANT Right 1990's   congential cataract   CYSTOSCOPY W/ URETERAL STENT PLACEMENT  11/12/2015   Procedure: CYSTOSCOPY WITH RETROGRADE PYELOGRAM/URETERAL STENT PLACEMENT;  Surgeon: [name], MD;  Location: [facility];   Service: Urology;;   CYSTOSCOPY W/ URETERAL STENT REMOVAL Left 11/22/2015   Procedure: CYSTOSCOPY WITH STENT REMOVAL;  Surgeon: [name], MD;  Location[facility];  Service: Urology;  Laterality: Left;   CYSTOSCOPY/URETEROSCOPY/HOLMIUM LASER Left 11/22/2015   Procedure: CYSTOSCOPY/URETEROSCOPY/HOLMIUM LASER;  Surgeon: [name], MD;  Location: [facility];  Service: Urology;  Laterality: Left;  FLEX URETEROSCOPY   FEMUR IM NAIL Left 08/04/2015   Procedure: INTRAMEDULLARY (IM) NAIL FEMORAL;  Surgeon: [name], MD;  Location: [facility];  Service: Orthopedics;  Laterality: Left;   INTRAMEDULLARY (IM) NAIL INTERTROCHANTERIC Right 10/04/2021   Procedure: INTRAMEDULLARY (IM) NAIL INTERTROCHANTRIC;  Surgeon: [name], MD;  Location: [facility];  Service: Orthopedics;  Laterality: Right;   IR KYPHO THORACIC WITH BONE BIOPSY  06/22/2018   TONSILLECTOMY  age 17    . Social History   Tobacco Use   Smoking status: Former    Packs/day: 1.00    Types: Cigarettes    Quit date: 08/03/2015    Years since quitting: 6.2   Smokeless tobacco: Never  Vaping Use   Vaping Use: Never used  Substance Use Topics   Alcohol use: Yes    Comment: occ   Drug use: Never    ALLERGIES:  is allergic to other and shellfish allergy.  MEDICATIONS:  Current Outpatient Medications  Medication Sig Dispense Refill   acetaminophen (TYLENOL) 500 MG tablet Take 500 mg by mouth every 6 (six) hours as needed for moderate pain. (Patient not taking: Reported on 10/31/2021)  aspirin EC 325 MG tablet Take 1 tablet (325 mg total) by mouth daily. X 5 weeks for DVT prophylaxis (Patient taking differently: Take 325 mg by mouth daily. For DVT Prophylaxis) 30 tablet 0   docusate sodium (COLACE) 100 MG capsule Take 1 capsule (100 mg total) by mouth 2 (two) times daily. 10 capsule 0   folic acid (FOLVITE) 1 MG tablet Take 2 tablets (2 mg total) by mouth daily.     loratadine (CLARITIN) 10 MG tablet  Take 10 mg by mouth daily as needed for allergies. (Patient not taking: Reported on 10/31/2021)     Multiple Vitamin (MULTIVITAMIN WITH MINERALS) TABS tablet Take 1 tablet by mouth daily.     OXYGEN Inhale 2 L into the lungs See admin instructions. To maintain SAT greater than 90%     pantoprazole (PROTONIX) 40 MG tablet Take 1 tablet (40 mg total) by mouth daily at 6 (six) AM.     polyethylene glycol (MIRALAX) 17 g packet Take 17 g by mouth daily. 14 each 0   PRESCRIPTION MEDICATION Take 90 mLs by mouth daily. Med Pass 2.0     QUEtiapine (SEROQUEL) 25 MG tablet Take 1 tablet (25 mg total) by mouth at bedtime. 20 tablet 0   sertraline (ZOLOFT) 25 MG tablet Take 25 mg by mouth daily.     vitamin B-12 2000 MCG tablet Take 1 tablet (2,000 mcg total) by mouth daily. 30 tablet 2   No current facility-administered medications for this visit.    PHYSICAL EXAMINATION: ECOG PERFORMANCE STATUS: 3 NAD GENERAL:alert, in no acute distress and comfortable SKIN: no acute rashes, no significant lesions EYES: conjunctiva are pink and non-injected, sclera anicteric OROPHARYNX: MMM, no exudates, no oropharyngeal erythema or ulceration NECK: supple, no JVD LYMPH:  no palpable lymphadenopathy in the cervical, axillary or inguinal regions LUNGS: clear to auscultation b/l with normal respiratory effort HEART: regular rate & rhythm ABDOMEN:  normoactive bowel sounds , non tender, not distended. Extremity: Redness swelling and warmth bilateral lower extremity PSYCH: alert & oriented x 3 with fluent speech NEURO: no focal motor/sensory deficits   LABORATORY DATA:  Labs results reviewed          RADIOGRAPHIC STUDIES: I have personally reviewed the radiological images as listed and agreed with the findings in the report.  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 Her hemoglobin tends to be lower during winter and has been in the 6-8 range. Overall stable hemoglobin  levels in the 6-7 range with folic acid replacement and cold avoidance.  #2 IgM lambda MGUS - cannot rule out possibility of lymphoplasmacytic lymphoma though less likely given chronicity of her condition. Flow cytometry- no clonal B cell lymphoma   #3 History of previous B12 deficiency  #4 Low back pain -compression fracture L4 -MRI L spine -Acute/subacute superior endplate fracture at L4 with loss of height of 10-20%. No retropulsed bone. This is quite likely the cause of the low back pain. This was probably present on the radiographs of 01/20/2018, but not diagnosable because of the spinal curvature and unlevel endplates. Old healed superior endplate fracture at I50  #5 Spiculated Lung nodule in right upper lobe  First seen on 05/28/18 CTA Chest Hasn't changed over 6 month interval with repeat CT Chest in February 2020  10/26/18 CT Chest which revealed There is a spiculated solid nodule of the right upper lobe, measuring 5-6 mm and stable from prior examination. Recommend additional follow-up in 12 months  to ensure ongoing stability. 2.  Emphysema. 3. Increased height loss deformities of T11 and T12.  08/13/2019 CT Chest ([id number]) revealed "4 mm posterior right upper lobe pulmonary nodule, unchanged. Dedicated follow-up imaging is not required per Fleischner Society guidelines. This recommendation follows the consensus statement Guidelines for Management of Small Pulmonary Nodules Detected on CT Images: From the Fleischner Society 2017; Radiology 2017; 284:228-243. Aortic Atherosclerosis (ICD10-I70.0) and Emphysema (ICD10-J43.9)."  #7 Vitamin D deficiency -continue Vit D  #8 Patient Active Problem List   Diagnosis Date Noted   Pulmonary nodule 11/06/2021   Exudative pleural effusion 11/05/2021   Fecal impaction ([place]) 11/05/2021   Abnormal CT scan 11/05/2021   Pneumonia 10/31/2021   Acute respiratory failure with hypoxia ([place]) 10/30/2021   Cellulitis of right lower extremity 10/30/2021    Left 9th rib fracture 10/05/2021   Dementia ([place]) 10/03/2021   Intraventricular hemorrhage ([place]) 10/03/2021   Hip fracture ([place]) 10/02/2021   CAP (community acquired pneumonia) 08/10/2021   Sepsis due to undetermined organism ([place]) 08/09/2021   Aortic atherosclerosis ([place]) 08/09/2021   COPD (chronic obstructive pulmonary disease) ([facility])    Elevated transaminase level    Symptomatic anemia 05/28/2018   Autoimmune hemolytic anemia ([place]) 02/25/2018   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 ([place]) 02/04/2018   Occult blood in stools 02/04/2018   Ureteral calculus 11/12/2015   Breast cancer screening, high risk patient 08/29/2015   Acute delirium 08/07/2015   Postoperative anemia due to acute blood loss 08/06/2015   Protein-calorie malnutrition, severe 08/04/2015   Closed intertrochanteric fracture of left femur ([place]) 08/03/2015   Fall 08/03/2015   Closed right hip fracture ([place]) 08/03/2015   Hyperbilirubinemia 08/01/2013   Hypokalemia 07/30/2013   Vitamin B 12 deficiency 08/07/2012   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   PLAN: -Recent labs reviewed with the patient's sisters yesterday. -We again discussed goals of care and treatment options. Patient would like to continue transfusion support at this time -Holding last dose of Rituxan in the context of recent pneumonia; -we will monitor the RUL pulmonary nodule  FOLLOW UP: Please schedule for weekly labs and 1 unit of PRBC transfusion x 4  Return to clinic with Dr. Irene Limbo in 4 weeks   Sullivan Lone MD [place] AAHIVMS [facility] Hematology/Oncology Physician [facility]....  I, Wilburn Mylar, am acting as scribe for Sullivan Lone, MD.   .I have reviewed the above documentation for  accuracy and completeness, and I agree with the above.  Brunetta Genera MD

## 2021-11-13 NOTE — Progress Notes (Signed)
Blood warmer used for 1 unit of PRBC transfusion.

## 2021-11-14 LAB — BPAM RBC
Blood Product Expiration Date: 202303212359
ISSUE DATE / TIME: 202302211022
Unit Type and Rh: 5100

## 2021-11-14 LAB — TYPE AND SCREEN
ABO/RH(D): B POS
Antibody Screen: NEGATIVE
Unit division: 0

## 2021-11-16 ENCOUNTER — Other Ambulatory Visit: Payer: Self-pay

## 2021-11-16 DIAGNOSIS — D5912 Cold autoimmune hemolytic anemia: Secondary | ICD-10-CM

## 2021-11-19 ENCOUNTER — Telehealth: Payer: Self-pay | Admitting: Hematology

## 2021-11-19 ENCOUNTER — Inpatient Hospital Stay: Payer: Medicare Other

## 2021-11-19 ENCOUNTER — Other Ambulatory Visit: Payer: Self-pay

## 2021-11-19 ENCOUNTER — Encounter: Payer: Self-pay | Admitting: Hematology

## 2021-11-19 DIAGNOSIS — D5912 Cold autoimmune hemolytic anemia: Secondary | ICD-10-CM

## 2021-11-19 LAB — CBC WITH DIFFERENTIAL (CANCER CENTER ONLY)
Abs Immature Granulocytes: 0.06 10*3/uL (ref 0.00–0.07)
Basophils Absolute: 0.1 10*3/uL (ref 0.0–0.1)
Basophils Relative: 1 %
Eosinophils Absolute: 0.1 10*3/uL (ref 0.0–0.5)
Eosinophils Relative: 1 %
HCT: 19.9 % — ABNORMAL LOW (ref 36.0–46.0)
Hemoglobin: 6.7 g/dL — CL (ref 12.0–15.0)
Immature Granulocytes: 1 %
Lymphocytes Relative: 27 %
Lymphs Abs: 2.5 10*3/uL (ref 0.7–4.0)
MCH: 30.7 pg (ref 26.0–34.0)
MCHC: 33.7 g/dL (ref 30.0–36.0)
MCV: 91.3 fL (ref 80.0–100.0)
Monocytes Absolute: 0.6 10*3/uL (ref 0.1–1.0)
Monocytes Relative: 6 %
Neutro Abs: 6 10*3/uL (ref 1.7–7.7)
Neutrophils Relative %: 64 %
Platelet Count: 643 10*3/uL — ABNORMAL HIGH (ref 150–400)
RBC: 2.18 MIL/uL — ABNORMAL LOW (ref 3.87–5.11)
RDW: 19.3 % — ABNORMAL HIGH (ref 11.5–15.5)
WBC Count: 9.3 10*3/uL (ref 4.0–10.5)
nRBC: 0 % (ref 0.0–0.2)

## 2021-11-19 LAB — SAMPLE TO BLOOD BANK

## 2021-11-19 LAB — PREPARE RBC (CROSSMATCH)

## 2021-11-19 NOTE — Telephone Encounter (Signed)
Scheduled follow-up appointments per 2/21 los. Patient's sister is aware.

## 2021-11-20 ENCOUNTER — Inpatient Hospital Stay: Payer: Medicare Other

## 2021-11-20 ENCOUNTER — Other Ambulatory Visit: Payer: Self-pay

## 2021-11-20 DIAGNOSIS — D5912 Cold autoimmune hemolytic anemia: Secondary | ICD-10-CM | POA: Diagnosis not present

## 2021-11-20 LAB — TYPE AND SCREEN
ABO/RH(D): B POS
Antibody Screen: NEGATIVE
Unit division: 0
Unit division: 0

## 2021-11-20 LAB — BPAM RBC
Blood Product Expiration Date: 202303282359
Blood Product Expiration Date: 202303292359
ISSUE DATE / TIME: 202302281040
ISSUE DATE / TIME: 202302281040
Unit Type and Rh: 5100
Unit Type and Rh: 5100

## 2021-11-20 LAB — MISC LABCORP TEST (SEND OUT): Labcorp test code: 9985

## 2021-11-20 MED ORDER — ACETAMINOPHEN 325 MG PO TABS
650.0000 mg | ORAL_TABLET | Freq: Once | ORAL | Status: AC
  Administered 2021-11-20: 650 mg via ORAL
  Filled 2021-11-20: qty 2

## 2021-11-20 MED ORDER — SODIUM CHLORIDE 0.9% IV SOLUTION
250.0000 mL | Freq: Once | INTRAVENOUS | Status: AC
  Administered 2021-11-20: 250 mL via INTRAVENOUS

## 2021-11-20 MED ORDER — METHYLPREDNISOLONE SODIUM SUCC 40 MG IJ SOLR
40.0000 mg | Freq: Once | INTRAMUSCULAR | Status: AC
  Administered 2021-11-20: 40 mg via INTRAVENOUS
  Filled 2021-11-20: qty 1

## 2021-11-21 ENCOUNTER — Other Ambulatory Visit: Payer: Self-pay

## 2021-11-21 ENCOUNTER — Inpatient Hospital Stay: Payer: Medicare Other | Attending: Hematology

## 2021-11-21 DIAGNOSIS — D5912 Cold autoimmune hemolytic anemia: Secondary | ICD-10-CM

## 2021-11-21 DIAGNOSIS — M545 Low back pain, unspecified: Secondary | ICD-10-CM | POA: Insufficient documentation

## 2021-11-21 DIAGNOSIS — R911 Solitary pulmonary nodule: Secondary | ICD-10-CM | POA: Diagnosis not present

## 2021-11-21 DIAGNOSIS — Z87891 Personal history of nicotine dependence: Secondary | ICD-10-CM | POA: Diagnosis not present

## 2021-11-21 DIAGNOSIS — Z90710 Acquired absence of both cervix and uterus: Secondary | ICD-10-CM | POA: Insufficient documentation

## 2021-11-21 DIAGNOSIS — E538 Deficiency of other specified B group vitamins: Secondary | ICD-10-CM | POA: Diagnosis not present

## 2021-11-21 DIAGNOSIS — Z79899 Other long term (current) drug therapy: Secondary | ICD-10-CM | POA: Diagnosis not present

## 2021-11-21 DIAGNOSIS — F0390 Unspecified dementia without behavioral disturbance: Secondary | ICD-10-CM | POA: Insufficient documentation

## 2021-11-21 LAB — PREPARE RBC (CROSSMATCH)

## 2021-11-21 MED ORDER — METHYLPREDNISOLONE SODIUM SUCC 40 MG IJ SOLR
40.0000 mg | Freq: Once | INTRAMUSCULAR | Status: AC
  Administered 2021-11-21: 40 mg via INTRAVENOUS
  Filled 2021-11-21: qty 1

## 2021-11-21 MED ORDER — ACETAMINOPHEN 325 MG PO TABS
650.0000 mg | ORAL_TABLET | Freq: Once | ORAL | Status: AC
  Administered 2021-11-21: 650 mg via ORAL
  Filled 2021-11-21: qty 2

## 2021-11-21 MED ORDER — SODIUM CHLORIDE 0.9% IV SOLUTION
250.0000 mL | Freq: Once | INTRAVENOUS | Status: AC
  Administered 2021-11-21: 250 mL via INTRAVENOUS

## 2021-11-22 LAB — TYPE AND SCREEN
ABO/RH(D): B POS
Antibody Screen: NEGATIVE
Unit division: 0
Unit division: 0

## 2021-11-22 LAB — BPAM RBC
Blood Product Expiration Date: 202303282359
Blood Product Expiration Date: 202303292359
ISSUE DATE / TIME: 202303011058
ISSUE DATE / TIME: 202303011058
Unit Type and Rh: 5100
Unit Type and Rh: 5100

## 2021-11-23 ENCOUNTER — Other Ambulatory Visit: Payer: Self-pay

## 2021-11-23 DIAGNOSIS — D5912 Cold autoimmune hemolytic anemia: Secondary | ICD-10-CM

## 2021-11-26 ENCOUNTER — Inpatient Hospital Stay: Payer: Medicare Other

## 2021-11-26 ENCOUNTER — Other Ambulatory Visit: Payer: Self-pay

## 2021-11-26 DIAGNOSIS — D5912 Cold autoimmune hemolytic anemia: Secondary | ICD-10-CM

## 2021-11-26 LAB — CBC WITH DIFFERENTIAL (CANCER CENTER ONLY)
Abs Immature Granulocytes: 0.05 10*3/uL (ref 0.00–0.07)
Basophils Absolute: 0.1 10*3/uL (ref 0.0–0.1)
Basophils Relative: 1 %
Eosinophils Absolute: 0.2 10*3/uL (ref 0.0–0.5)
Eosinophils Relative: 1 %
HCT: 30.5 % — ABNORMAL LOW (ref 36.0–46.0)
Hemoglobin: 9.9 g/dL — ABNORMAL LOW (ref 12.0–15.0)
Immature Granulocytes: 0 %
Lymphocytes Relative: 11 %
Lymphs Abs: 1.4 10*3/uL (ref 0.7–4.0)
MCH: 31.3 pg (ref 26.0–34.0)
MCHC: 32.5 g/dL (ref 30.0–36.0)
MCV: 96.5 fL (ref 80.0–100.0)
Monocytes Absolute: 0.6 10*3/uL (ref 0.1–1.0)
Monocytes Relative: 5 %
Neutro Abs: 9.8 10*3/uL — ABNORMAL HIGH (ref 1.7–7.7)
Neutrophils Relative %: 82 %
Platelet Count: 480 10*3/uL — ABNORMAL HIGH (ref 150–400)
RBC: 3.16 MIL/uL — ABNORMAL LOW (ref 3.87–5.11)
RDW: 18.6 % — ABNORMAL HIGH (ref 11.5–15.5)
WBC Count: 12.1 10*3/uL — ABNORMAL HIGH (ref 4.0–10.5)
nRBC: 0 % (ref 0.0–0.2)

## 2021-11-26 LAB — CMP (CANCER CENTER ONLY)
ALT: 14 U/L (ref 0–44)
AST: 28 U/L (ref 15–41)
Albumin: 3.6 g/dL (ref 3.5–5.0)
Alkaline Phosphatase: 194 U/L — ABNORMAL HIGH (ref 38–126)
Anion gap: 4 — ABNORMAL LOW (ref 5–15)
BUN: 18 mg/dL (ref 8–23)
CO2: 34 mmol/L — ABNORMAL HIGH (ref 22–32)
Calcium: 8.5 mg/dL — ABNORMAL LOW (ref 8.9–10.3)
Chloride: 102 mmol/L (ref 98–111)
Creatinine: 0.44 mg/dL (ref 0.44–1.00)
GFR, Estimated: >60 mL/min (ref >60)
Glucose, Bld: 119 mg/dL — ABNORMAL HIGH (ref 70–99)
Potassium: 3.9 mmol/L (ref 3.5–5.1)
Sodium: 140 mmol/L (ref 135–145)
Total Bilirubin: 2.2 mg/dL — ABNORMAL HIGH (ref 0.3–1.2)
Total Protein: 6 g/dL — ABNORMAL LOW (ref 6.5–8.1)

## 2021-11-26 LAB — SAMPLE TO BLOOD BANK

## 2021-11-27 ENCOUNTER — Inpatient Hospital Stay: Payer: Medicare Other

## 2021-11-29 ENCOUNTER — Other Ambulatory Visit: Payer: Self-pay

## 2021-11-29 DIAGNOSIS — D5912 Cold autoimmune hemolytic anemia: Secondary | ICD-10-CM

## 2021-12-03 ENCOUNTER — Inpatient Hospital Stay: Payer: Medicare Other

## 2021-12-03 ENCOUNTER — Other Ambulatory Visit: Payer: Self-pay

## 2021-12-03 DIAGNOSIS — D5912 Cold autoimmune hemolytic anemia: Secondary | ICD-10-CM | POA: Diagnosis not present

## 2021-12-03 LAB — CBC WITH DIFFERENTIAL (CANCER CENTER ONLY)
Abs Immature Granulocytes: 0.03 10*3/uL (ref 0.00–0.07)
Basophils Absolute: 0.1 10*3/uL (ref 0.0–0.1)
Basophils Relative: 1 %
Eosinophils Absolute: 0.2 10*3/uL (ref 0.0–0.5)
Eosinophils Relative: 2 %
HCT: 24.1 % — ABNORMAL LOW (ref 36.0–46.0)
Hemoglobin: 8.2 g/dL — ABNORMAL LOW (ref 12.0–15.0)
Immature Granulocytes: 0 %
Lymphocytes Relative: 22 %
Lymphs Abs: 2.1 10*3/uL (ref 0.7–4.0)
MCH: 32.3 pg (ref 26.0–34.0)
MCHC: 34 g/dL (ref 30.0–36.0)
MCV: 94.9 fL (ref 80.0–100.0)
Monocytes Absolute: 0.5 10*3/uL (ref 0.1–1.0)
Monocytes Relative: 6 %
Neutro Abs: 6.3 10*3/uL (ref 1.7–7.7)
Neutrophils Relative %: 69 %
Platelet Count: 544 10*3/uL — ABNORMAL HIGH (ref 150–400)
RBC: 2.54 MIL/uL — ABNORMAL LOW (ref 3.87–5.11)
RDW: 18.6 % — ABNORMAL HIGH (ref 11.5–15.5)
WBC Count: 9.2 10*3/uL (ref 4.0–10.5)
nRBC: 0 % (ref 0.0–0.2)

## 2021-12-03 LAB — SAMPLE TO BLOOD BANK

## 2021-12-04 ENCOUNTER — Inpatient Hospital Stay: Payer: Medicare Other

## 2021-12-07 ENCOUNTER — Other Ambulatory Visit: Payer: Self-pay

## 2021-12-07 DIAGNOSIS — D5912 Cold autoimmune hemolytic anemia: Secondary | ICD-10-CM

## 2021-12-10 ENCOUNTER — Inpatient Hospital Stay: Payer: Medicare Other | Admitting: Hematology

## 2021-12-10 ENCOUNTER — Inpatient Hospital Stay: Payer: Medicare Other

## 2021-12-10 ENCOUNTER — Other Ambulatory Visit: Payer: Self-pay

## 2021-12-10 VITALS — BP 112/52 | HR 55 | Temp 97.7°F | Resp 16 | Ht 61.0 in

## 2021-12-10 DIAGNOSIS — D5912 Cold autoimmune hemolytic anemia: Secondary | ICD-10-CM | POA: Diagnosis not present

## 2021-12-10 LAB — CBC WITH DIFFERENTIAL (CANCER CENTER ONLY)
Abs Immature Granulocytes: 0.02 10*3/uL (ref 0.00–0.07)
Basophils Absolute: 0.1 10*3/uL (ref 0.0–0.1)
Basophils Relative: 1 %
Eosinophils Absolute: 0.2 10*3/uL (ref 0.0–0.5)
Eosinophils Relative: 3 %
HCT: 22.5 % — ABNORMAL LOW (ref 36.0–46.0)
Hemoglobin: 7.7 g/dL — ABNORMAL LOW (ref 12.0–15.0)
Immature Granulocytes: 0 %
Lymphocytes Relative: 29 %
Lymphs Abs: 2.2 10*3/uL (ref 0.7–4.0)
MCH: 32.8 pg (ref 26.0–34.0)
MCHC: 34.2 g/dL (ref 30.0–36.0)
MCV: 95.7 fL (ref 80.0–100.0)
Monocytes Absolute: 0.5 10*3/uL (ref 0.1–1.0)
Monocytes Relative: 7 %
Neutro Abs: 4.5 10*3/uL (ref 1.7–7.7)
Neutrophils Relative %: 60 %
Platelet Count: 501 10*3/uL — ABNORMAL HIGH (ref 150–400)
RBC: 2.35 MIL/uL — ABNORMAL LOW (ref 3.87–5.11)
RDW: 19.5 % — ABNORMAL HIGH (ref 11.5–15.5)
WBC Count: 7.5 10*3/uL (ref 4.0–10.5)
nRBC: 0 % (ref 0.0–0.2)

## 2021-12-10 LAB — SAMPLE TO BLOOD BANK

## 2021-12-11 ENCOUNTER — Inpatient Hospital Stay: Payer: Medicare Other

## 2021-12-12 ENCOUNTER — Telehealth: Payer: Self-pay | Admitting: Hematology

## 2021-12-12 NOTE — Telephone Encounter (Signed)
Scheduled follow-up appointments per 3/20 los. Patient's sister is aware. ?

## 2021-12-17 ENCOUNTER — Encounter: Payer: Self-pay | Admitting: Hematology

## 2021-12-17 NOTE — Progress Notes (Signed)
.. ?HEMATOLOGY ONCOLOGY PROGRESS NOTE ? ?Date of service: .12/10/2021 ? ? ? ?Patient Care Team: ?[name], MD as PCP - General (Internal Medicine) ?Brunetta Genera, MD as Consulting Physician (Hematology) ?  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 ? ?INTERVAL HISTORY:  ? ?Ms Zenker is here for continued evaluation and management of cold agglutinin related autoimmune hemolytic anemia.  She is accompanied by both her sisters.  Is continuing to have significant issues with dementia and with limited mobility and limited p.o. intake.  Continues to live at her nursing home.  Continues to have conservative goals of care. ?Notes no significant pain at the site of her recent hip surgery. ?No reported bleeding or abnormal bruising. ?No obvious infection issues. ?Labs done today reviewed in detail. ? ?REVIEW OF SYSTEMS:   ?10 Point review of Systems was done is negative except as noted above. ? ? ?Past Medical History:  ?Diagnosis Date  ? Aortic atherosclerosis ([place]) 08/09/2021  ? B12 deficiency   ? Bilateral carotid artery stenosis   ? right CCA and ICA 1-39%/    left ICA 40-59%  per last duplex  10-19-2015  ? COPD (chronic obstructive pulmonary disease) ([place])   ? Diverticulosis of colon   ? History of diverticulitis of colon   ? 2004  ? 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 montiored by dr Marko Plume  ? Intraventricular hemorrhage ([place]) 10/03/2021  ? Renal and ureteric calculus   ? left  ? ? ?. ?Past Surgical History:  ?Procedure Laterality Date  ? ABDOMINAL HYSTERECTOMY  1986  ? APPENDECTOMY  1967  ? BREAST EXCISIONAL BIOPSY Right 1984  ? CATARACT EXTRACTION W/ INTRAOCULAR LENS IMPLANT Right 1990's  ? congential cataract  ? CYSTOSCOPY W/ URETERAL STENT PLACEMENT  11/12/2015  ? Procedure: CYSTOSCOPY WITH RETROGRADE PYELOGRAM/URETERAL STENT PLACEMENT;  Surgeon: [name], MD;  Location: [facility];  Service: Urology;;  ? CYSTOSCOPY W/ URETERAL STENT REMOVAL Left  11/22/2015  ? Procedure: CYSTOSCOPY WITH STENT REMOVAL;  Surgeon: [name], MD;  Location: [facility];  Service: Urology;  Laterality: Left;  ? CYSTOSCOPY/URETEROSCOPY/HOLMIUM LASER Left 11/22/2015  ? Procedure: CYSTOSCOPY/URETEROSCOPY/HOLMIUM LASER;  Surgeon: [name], MD;  Location: [facility];  Service: Urology;  Laterality: Left;  FLEX URETEROSCOPY  ? FEMUR IM NAIL Left 08/04/2015  ? Procedure: INTRAMEDULLARY (IM) NAIL FEMORAL;  Surgeon: [name], MD;  Location: [facility];  Service: Orthopedics;  Laterality: Left;  ? INTRAMEDULLARY (IM) NAIL INTERTROCHANTERIC Right 10/04/2021  ? Procedure: INTRAMEDULLARY (IM) NAIL INTERTROCHANTRIC;  Surgeon: [name], MD;  Location: [facility];  Service: Orthopedics;  Laterality: Right;  ? IR KYPHO THORACIC WITH BONE BIOPSY  06/22/2018  ? TONSILLECTOMY  age 71  ? ? ?. ?Social History  ? ?Tobacco Use  ? Smoking status: Former  ?  Packs/day: 1.00  ?  Types: Cigarettes  ?  Quit date: 08/03/2015  ?  Years since quitting: 6.3  ? Smokeless tobacco: Never  ?Vaping Use  ? Vaping Use: Never used  ?Substance Use Topics  ? Alcohol use: Yes  ?  Comment: occ  ? Drug use: Never  ? ? ?ALLERGIES:  is allergic to other and shellfish allergy. ? ?MEDICATIONS:  ?Current Outpatient Medications  ?Medication Sig Dispense Refill  ? acetaminophen (TYLENOL) 500 MG tablet Take 500 mg by mouth every 6 (six) hours as needed for moderate pain. (Patient not taking: Reported on 10/31/2021)    ? aspirin EC 325 MG tablet Take  1 tablet (325 mg total) by mouth daily. X 5 weeks for DVT prophylaxis (Patient taking differently: Take 325 mg by mouth daily. For DVT Prophylaxis) 30 tablet 0  ? docusate sodium (COLACE) 100 MG capsule Take 1 capsule (100 mg total) by mouth 2 (two) times daily. 10 capsule 0  ? folic acid (FOLVITE) 1 MG tablet Take 2 tablets (2 mg total) by mouth daily.    ? loratadine (CLARITIN) 10 MG tablet Take 10 mg by mouth daily as needed for allergies. (Patient not  taking: Reported on 10/31/2021)    ? Multiple Vitamin (MULTIVITAMIN WITH MINERALS) TABS tablet Take 1 tablet by mouth daily.    ? OXYGEN Inhale 2 L into the lungs See admin instructions. To maintain SAT greater than 90%    ? pantoprazole (PROTONIX) 40 MG tablet Take 1 tablet (40 mg total) by mouth daily at 6 (six) AM.    ? PRESCRIPTION MEDICATION Take 90 mLs by mouth daily. Med Pass 2.0    ? QUEtiapine (SEROQUEL) 25 MG tablet Take 1 tablet (25 mg total) by mouth at bedtime. 20 tablet 0  ? sertraline (ZOLOFT) 25 MG tablet Take 25 mg by mouth daily.    ? vitamin B-12 2000 MCG tablet Take 1 tablet (2,000 mcg total) by mouth daily. 30 tablet 2  ? ?No current facility-administered medications for this visit.  ? ? ?PHYSICAL EXAMINATION: ?ECOG PERFORMANCE STATUS: 4 ?.BP (!) 112/52 (BP Location: Right Arm, Patient Position: Sitting)   Pulse (!) 55   Temp 97.7 ?F (36.5 ?C) (Temporal)   Resp 16   Ht '5\' 1"'$  (1.549 m)   SpO2 100%   BMI 17.75 kg/m?  ?NAD ?GENERAL:alert, in no acute distress and comfortable ?EYES: Conjunctival pallor noted ?LYMPH:  no palpable lymphadenopathy in the cervical, axillary or inguinal regions ?LUNGS: clear to auscultation b/l with normal respiratory effort ?HEART: regular rate & rhythm ?ABDOMEN:  normoactive bowel sounds , non tender, not distended. ?Extremity: Trace pedal edema ? ?LABORATORY DATA:  ?Labs results reviewed ? ? ? ? ? ? ? ? ?. ? ?  Latest Ref Rng & Units 12/10/2021  ?  9:34 AM 12/03/2021  ?  9:10 AM 11/26/2021  ?  8:57 AM  ?CBC  ?WBC 4.0 - 10.5 K/uL 7.5   9.2   12.1    ?Hemoglobin 12.0 - 15.0 g/dL 7.7   8.2   9.9    ?Hematocrit 36.0 - 46.0 % 22.5   24.1   30.5    ?Platelets 150 - 400 K/uL 501   544   480    ? ? ? ?  Latest Ref Rng & Units 11/26/2021  ?  8:57 AM 11/12/2021  ?  9:19 AM 11/06/2021  ?  5:43 AM  ?CMP  ?Glucose 70 - 99 mg/dL 119   35   71    ?BUN 8 - 23 mg/dL '18   13   8    '$ ?Creatinine 0.44 - 1.00 mg/dL 0.44   0.39   0.34    ?Sodium 135 - 145 mmol/L 140   137   133    ?Potassium  3.5 - 5.1 mmol/L 3.9   3.7   4.1    ?Chloride 98 - 111 mmol/L 102   97   96    ?CO2 22 - 32 mmol/L 34   34   29    ?Calcium 8.9 - 10.3 mg/dL 8.5   8.7   8.0    ?Total Protein 6.5 -  8.1 g/dL 6.0   6.4   5.4    ?Total Bilirubin 0.3 - 1.2 mg/dL 2.2   2.2   0.3    ?Alkaline Phos 38 - 126 U/L 194   234   129    ?AST 15 - 41 U/L '28   29   14    '$ ?ALT 0 - 44 U/L '14   16   12    '$ ? ? ? ?RADIOGRAPHIC STUDIES: ?I have personally reviewed the radiological images as listed and agreed with the findings in the report. ? ?ASSESSMENT & PLAN:  ? ?#1: Cold Agglutinin related chronic hemolytic anemia. ?Her hemoglobin tends to be lower during winter and has been in the 6-8 range. ?Overall stable hemoglobin levels in the 6-7 range with folic acid replacement and cold avoidance. ? ?#2 IgM lambda MGUS - cannot rule out possibility of lymphoplasmacytic lymphoma though less likely given chronicity of her condition. ?Flow cytometry- no clonal B cell lymphoma  ? ?#3 History of previous B12 deficiency ? ?#4 Low back pain -compression fracture L4 ?-MRI L spine -Acute/subacute superior endplate fracture at L4 with loss of height of 10-20%. No retropulsed bone. This is quite likely the cause of the low back pain. This was probably present on the radiographs of 01/20/2018, but not diagnosable because of the spinal curvature and unlevel endplates. Old healed superior endplate fracture at F64 ? ?#5 Spiculated Lung nodule in right upper lobe  ?First seen on 05/28/18 CTA Chest ?Hasn't changed over 6 month interval with repeat CT Chest in February 2020 ? ?08/13/2019 CT Chest ([id number]) revealed "4 mm posterior right upper lobe pulmonary nodule, unchanged. Dedicated follow-up imaging is not required per Fleischner Society guidelines. This recommendation follows the consensus statement Guidelines for Management of Small Pulmonary Nodules Detected on CT Images: From the Fleischner Society 2017; Radiology 2017; 284:228-243. Aortic Atherosclerosis  (ICD10-I70.0) and Emphysema (ICD10-J43.9)." ? ?#7 Vitamin D deficiency ?-continue Vit D ? ?#8 ?Patient Active Problem List  ? Diagnosis Date Noted  ? Pulmonary nodule 11/06/2021  ? Exudative pleural effusion 11/05/2021  ? Fe

## 2021-12-20 ENCOUNTER — Other Ambulatory Visit: Payer: Self-pay

## 2021-12-20 DIAGNOSIS — D5912 Cold autoimmune hemolytic anemia: Secondary | ICD-10-CM

## 2021-12-24 ENCOUNTER — Other Ambulatory Visit: Payer: Self-pay

## 2021-12-24 ENCOUNTER — Inpatient Hospital Stay: Payer: Medicare Other | Attending: Hematology

## 2021-12-24 DIAGNOSIS — D5912 Cold autoimmune hemolytic anemia: Secondary | ICD-10-CM | POA: Insufficient documentation

## 2021-12-24 LAB — CBC WITH DIFFERENTIAL (CANCER CENTER ONLY)
Abs Immature Granulocytes: 0.03 10*3/uL (ref 0.00–0.07)
Basophils Absolute: 0.1 10*3/uL (ref 0.0–0.1)
Basophils Relative: 1 %
Eosinophils Absolute: 0.2 10*3/uL (ref 0.0–0.5)
Eosinophils Relative: 2 %
HCT: 23.7 % — ABNORMAL LOW (ref 36.0–46.0)
Hemoglobin: 8.2 g/dL — ABNORMAL LOW (ref 12.0–15.0)
Immature Granulocytes: 0 %
Lymphocytes Relative: 24 %
Lymphs Abs: 1.9 10*3/uL (ref 0.7–4.0)
MCH: 36.3 pg — ABNORMAL HIGH (ref 26.0–34.0)
MCHC: 34.6 g/dL (ref 30.0–36.0)
MCV: 104.9 fL — ABNORMAL HIGH (ref 80.0–100.0)
Monocytes Absolute: 0.7 10*3/uL (ref 0.1–1.0)
Monocytes Relative: 9 %
Neutro Abs: 5.1 10*3/uL (ref 1.7–7.7)
Neutrophils Relative %: 64 %
Platelet Count: 436 10*3/uL — ABNORMAL HIGH (ref 150–400)
RBC: 2.26 MIL/uL — ABNORMAL LOW (ref 3.87–5.11)
RDW: 20 % — ABNORMAL HIGH (ref 11.5–15.5)
WBC Count: 8 10*3/uL (ref 4.0–10.5)
nRBC: 0 % (ref 0.0–0.2)

## 2021-12-24 LAB — CMP (CANCER CENTER ONLY)
ALT: 17 U/L (ref 0–44)
AST: 25 U/L (ref 15–41)
Albumin: 3.7 g/dL (ref 3.5–5.0)
Alkaline Phosphatase: 164 U/L — ABNORMAL HIGH (ref 38–126)
Anion gap: 6 (ref 5–15)
BUN: 17 mg/dL (ref 8–23)
CO2: 32 mmol/L (ref 22–32)
Calcium: 8.7 mg/dL — ABNORMAL LOW (ref 8.9–10.3)
Chloride: 102 mmol/L (ref 98–111)
Creatinine: 0.39 mg/dL — ABNORMAL LOW (ref 0.44–1.00)
GFR, Estimated: >60 mL/min (ref >60)
Glucose, Bld: 79 mg/dL (ref 70–99)
Potassium: 4.3 mmol/L (ref 3.5–5.1)
Sodium: 140 mmol/L (ref 135–145)
Total Bilirubin: 1.1 mg/dL (ref 0.3–1.2)
Total Protein: 6.1 g/dL — ABNORMAL LOW (ref 6.5–8.1)

## 2021-12-24 LAB — SAMPLE TO BLOOD BANK

## 2021-12-25 ENCOUNTER — Inpatient Hospital Stay: Payer: Medicare Other

## 2022-01-03 ENCOUNTER — Other Ambulatory Visit: Payer: Self-pay

## 2022-01-03 DIAGNOSIS — D5912 Cold autoimmune hemolytic anemia: Secondary | ICD-10-CM

## 2022-01-07 ENCOUNTER — Other Ambulatory Visit: Payer: Self-pay

## 2022-01-07 ENCOUNTER — Inpatient Hospital Stay: Payer: Medicare Other

## 2022-01-07 DIAGNOSIS — D5912 Cold autoimmune hemolytic anemia: Secondary | ICD-10-CM

## 2022-01-07 LAB — CBC WITH DIFFERENTIAL (CANCER CENTER ONLY)
Abs Immature Granulocytes: 0.05 10*3/uL (ref 0.00–0.07)
Basophils Absolute: 0.1 10*3/uL (ref 0.0–0.1)
Basophils Relative: 1 %
Eosinophils Absolute: 0.3 10*3/uL (ref 0.0–0.5)
Eosinophils Relative: 3 %
HCT: 22 % — ABNORMAL LOW (ref 36.0–46.0)
Hemoglobin: 7.6 g/dL — ABNORMAL LOW (ref 12.0–15.0)
Immature Granulocytes: 1 %
Lymphocytes Relative: 23 %
Lymphs Abs: 2.1 10*3/uL (ref 0.7–4.0)
MCH: 35.5 pg — ABNORMAL HIGH (ref 26.0–34.0)
MCHC: 34.5 g/dL (ref 30.0–36.0)
MCV: 102.8 fL — ABNORMAL HIGH (ref 80.0–100.0)
Monocytes Absolute: 0.5 10*3/uL (ref 0.1–1.0)
Monocytes Relative: 6 %
Neutro Abs: 6.4 10*3/uL (ref 1.7–7.7)
Neutrophils Relative %: 66 %
Platelet Count: 507 10*3/uL — ABNORMAL HIGH (ref 150–400)
RBC: 2.14 MIL/uL — ABNORMAL LOW (ref 3.87–5.11)
RDW: 17.2 % — ABNORMAL HIGH (ref 11.5–15.5)
WBC Count: 9.5 10*3/uL (ref 4.0–10.5)
nRBC: 0 % (ref 0.0–0.2)

## 2022-01-07 LAB — CMP (CANCER CENTER ONLY)
ALT: 14 U/L (ref 0–44)
AST: 24 U/L (ref 15–41)
Albumin: 3.9 g/dL (ref 3.5–5.0)
Alkaline Phosphatase: 146 U/L — ABNORMAL HIGH (ref 38–126)
Anion gap: 6 (ref 5–15)
BUN: 17 mg/dL (ref 8–23)
CO2: 30 mmol/L (ref 22–32)
Calcium: 8.6 mg/dL — ABNORMAL LOW (ref 8.9–10.3)
Chloride: 105 mmol/L (ref 98–111)
Creatinine: 0.42 mg/dL — ABNORMAL LOW (ref 0.44–1.00)
GFR, Estimated: >60 mL/min (ref >60)
Glucose, Bld: 80 mg/dL (ref 70–99)
Potassium: 3.8 mmol/L (ref 3.5–5.1)
Sodium: 141 mmol/L (ref 135–145)
Total Bilirubin: 1.7 mg/dL — ABNORMAL HIGH (ref 0.3–1.2)
Total Protein: 6.2 g/dL — ABNORMAL LOW (ref 6.5–8.1)

## 2022-01-07 LAB — SAMPLE TO BLOOD BANK

## 2022-01-07 NOTE — Progress Notes (Signed)
Contacted pt's sister and let her know no need for blood transfusion in am. Hgb 7.6. Per Dr Irene Limbo it is > 7.0 so no need to transfuse at this time.  ?

## 2022-01-08 ENCOUNTER — Inpatient Hospital Stay: Payer: Medicare Other

## 2022-01-18 ENCOUNTER — Other Ambulatory Visit: Payer: Self-pay

## 2022-01-18 DIAGNOSIS — D5912 Cold autoimmune hemolytic anemia: Secondary | ICD-10-CM

## 2022-01-21 ENCOUNTER — Inpatient Hospital Stay: Payer: Medicare Other | Attending: Hematology

## 2022-01-21 ENCOUNTER — Other Ambulatory Visit: Payer: Self-pay

## 2022-01-21 DIAGNOSIS — D5912 Cold autoimmune hemolytic anemia: Secondary | ICD-10-CM | POA: Insufficient documentation

## 2022-01-21 DIAGNOSIS — Z23 Encounter for immunization: Secondary | ICD-10-CM | POA: Diagnosis not present

## 2022-01-21 LAB — CMP (CANCER CENTER ONLY)
ALT: 36 U/L (ref 0–44)
AST: 30 U/L (ref 15–41)
Albumin: 4.1 g/dL (ref 3.5–5.0)
Alkaline Phosphatase: 157 U/L — ABNORMAL HIGH (ref 38–126)
Anion gap: 6 (ref 5–15)
BUN: 17 mg/dL (ref 8–23)
CO2: 31 mmol/L (ref 22–32)
Calcium: 8.6 mg/dL — ABNORMAL LOW (ref 8.9–10.3)
Chloride: 103 mmol/L (ref 98–111)
Creatinine: 0.43 mg/dL — ABNORMAL LOW (ref 0.44–1.00)
GFR, Estimated: >60 mL/min (ref >60)
Glucose, Bld: 89 mg/dL (ref 70–99)
Potassium: 4.2 mmol/L (ref 3.5–5.1)
Sodium: 140 mmol/L (ref 135–145)
Total Bilirubin: 1.6 mg/dL — ABNORMAL HIGH (ref 0.3–1.2)
Total Protein: 6.3 g/dL — ABNORMAL LOW (ref 6.5–8.1)

## 2022-01-21 LAB — CBC WITH DIFFERENTIAL (CANCER CENTER ONLY)
Abs Immature Granulocytes: 0.04 10*3/uL (ref 0.00–0.07)
Basophils Absolute: 0.1 10*3/uL (ref 0.0–0.1)
Basophils Relative: 1 %
Eosinophils Absolute: 0.3 10*3/uL (ref 0.0–0.5)
Eosinophils Relative: 4 %
HCT: 24.6 % — ABNORMAL LOW (ref 36.0–46.0)
Hemoglobin: 8.7 g/dL — ABNORMAL LOW (ref 12.0–15.0)
Immature Granulocytes: 1 %
Lymphocytes Relative: 26 %
Lymphs Abs: 2.3 10*3/uL (ref 0.7–4.0)
MCH: 36.7 pg — ABNORMAL HIGH (ref 26.0–34.0)
MCHC: 35.4 g/dL (ref 30.0–36.0)
MCV: 103.8 fL — ABNORMAL HIGH (ref 80.0–100.0)
Monocytes Absolute: 0.6 10*3/uL (ref 0.1–1.0)
Monocytes Relative: 7 %
Neutro Abs: 5.3 10*3/uL (ref 1.7–7.7)
Neutrophils Relative %: 61 %
Platelet Count: 336 10*3/uL (ref 150–400)
RBC: 2.37 MIL/uL — ABNORMAL LOW (ref 3.87–5.11)
RDW: 16.7 % — ABNORMAL HIGH (ref 11.5–15.5)
WBC Count: 8.6 10*3/uL (ref 4.0–10.5)
nRBC: 0 % (ref 0.0–0.2)

## 2022-01-21 LAB — SAMPLE TO BLOOD BANK

## 2022-01-22 ENCOUNTER — Inpatient Hospital Stay: Payer: Medicare Other

## 2022-01-25 ENCOUNTER — Other Ambulatory Visit: Payer: Self-pay

## 2022-01-25 DIAGNOSIS — D5912 Cold autoimmune hemolytic anemia: Secondary | ICD-10-CM

## 2022-01-29 ENCOUNTER — Other Ambulatory Visit: Payer: Self-pay

## 2022-01-29 ENCOUNTER — Observation Stay (HOSPITAL_COMMUNITY)
Admission: EM | Admit: 2022-01-29 | Discharge: 2022-01-30 | Disposition: A | Discharge status: Skilled Nursing Facility | Payer: Medicare Other | Attending: Internal Medicine | Admitting: Internal Medicine

## 2022-01-29 DIAGNOSIS — F03B11 Unspecified dementia, moderate, with agitation: Secondary | ICD-10-CM

## 2022-01-29 DIAGNOSIS — D591 Autoimmune hemolytic anemia, unspecified: Secondary | ICD-10-CM | POA: Diagnosis not present

## 2022-01-29 DIAGNOSIS — D649 Anemia, unspecified: Secondary | ICD-10-CM

## 2022-01-29 DIAGNOSIS — J410 Simple chronic bronchitis: Secondary | ICD-10-CM | POA: Diagnosis not present

## 2022-01-29 DIAGNOSIS — F0390 Unspecified dementia without behavioral disturbance: Secondary | ICD-10-CM | POA: Diagnosis not present

## 2022-01-29 DIAGNOSIS — Z7982 Long term (current) use of aspirin: Secondary | ICD-10-CM | POA: Insufficient documentation

## 2022-01-29 DIAGNOSIS — Z87891 Personal history of nicotine dependence: Secondary | ICD-10-CM | POA: Insufficient documentation

## 2022-01-29 DIAGNOSIS — D5912 Cold autoimmune hemolytic anemia: Principal | ICD-10-CM

## 2022-01-29 DIAGNOSIS — Z79899 Other long term (current) drug therapy: Secondary | ICD-10-CM | POA: Diagnosis not present

## 2022-01-29 DIAGNOSIS — J449 Chronic obstructive pulmonary disease, unspecified: Secondary | ICD-10-CM | POA: Insufficient documentation

## 2022-01-29 LAB — DIFFERENTIAL
Abs Immature Granulocytes: 0 10*3/uL (ref 0.00–0.07)
Basophils Absolute: 0 10*3/uL (ref 0.0–0.1)
Basophils Relative: 0 %
Eosinophils Absolute: 0.5 10*3/uL (ref 0.0–0.5)
Eosinophils Relative: 2 %
Lymphocytes Relative: 21 %
Lymphs Abs: 5.3 10*3/uL — ABNORMAL HIGH (ref 0.7–4.0)
Monocytes Absolute: 2.3 10*3/uL — ABNORMAL HIGH (ref 0.1–1.0)
Monocytes Relative: 9 %
Neutro Abs: 17.1 10*3/uL — ABNORMAL HIGH (ref 1.7–7.7)
Neutrophils Relative %: 68 %

## 2022-01-29 LAB — COMPREHENSIVE METABOLIC PANEL
ALT: 18 U/L (ref 0–44)
ALT: 18 U/L (ref 0–44)
AST: 48 U/L — ABNORMAL HIGH (ref 15–41)
AST: 43 U/L — ABNORMAL HIGH (ref 15–41)
Albumin: 3.1 g/dL — ABNORMAL LOW (ref 3.5–5.0)
Albumin: 3.1 g/dL — ABNORMAL LOW (ref 3.5–5.0)
Alkaline Phosphatase: 101 U/L (ref 38–126)
Alkaline Phosphatase: 99 U/L (ref 38–126)
Anion gap: 11 (ref 5–15)
Anion gap: 10 (ref 5–15)
BUN: 18 mg/dL (ref 8–23)
BUN: 16 mg/dL (ref 8–23)
CO2: 26 mmol/L (ref 22–32)
CO2: 28 mmol/L (ref 22–32)
Calcium: 7.5 mg/dL — ABNORMAL LOW (ref 8.9–10.3)
Calcium: 7.5 mg/dL — ABNORMAL LOW (ref 8.9–10.3)
Chloride: 95 mmol/L — ABNORMAL LOW (ref 98–111)
Chloride: 99 mmol/L (ref 98–111)
Creatinine, Ser: 0.54 mg/dL (ref 0.44–1.00)
Creatinine, Ser: 0.53 mg/dL (ref 0.44–1.00)
GFR, Estimated: >60 mL/min (ref >60)
GFR, Estimated: >60 mL/min (ref >60)
Glucose, Bld: 111 mg/dL — ABNORMAL HIGH (ref 70–99)
Glucose, Bld: 102 mg/dL — ABNORMAL HIGH (ref 70–99)
Potassium: 2.9 mmol/L — ABNORMAL LOW (ref 3.5–5.1)
Potassium: 2.8 mmol/L — ABNORMAL LOW (ref 3.5–5.1)
Sodium: 132 mmol/L — ABNORMAL LOW (ref 135–145)
Sodium: 137 mmol/L (ref 135–145)
Total Bilirubin: 2.5 mg/dL — ABNORMAL HIGH (ref 0.3–1.2)
Total Bilirubin: 2.4 mg/dL — ABNORMAL HIGH (ref 0.3–1.2)
Total Protein: 5.4 g/dL — ABNORMAL LOW (ref 6.5–8.1)
Total Protein: 5.4 g/dL — ABNORMAL LOW (ref 6.5–8.1)

## 2022-01-29 LAB — CBC WITH DIFFERENTIAL/PLATELET
Abs Immature Granulocytes: 0.3 10*3/uL — ABNORMAL HIGH (ref 0.00–0.07)
Basophils Absolute: 0.5 10*3/uL — ABNORMAL HIGH (ref 0.0–0.1)
Basophils Relative: 2 %
Eosinophils Absolute: 0 10*3/uL (ref 0.0–0.5)
Eosinophils Relative: 0 %
HCT: 11.4 % — ABNORMAL LOW (ref 36.0–46.0)
Hemoglobin: 3.8 g/dL — CL (ref 12.0–15.0)
Lymphocytes Relative: 11 %
Lymphs Abs: 2.9 10*3/uL (ref 0.7–4.0)
MCH: 33.6 pg (ref 26.0–34.0)
MCHC: 33.3 g/dL (ref 30.0–36.0)
MCV: 100.9 fL — ABNORMAL HIGH (ref 80.0–100.0)
Metamyelocytes Relative: 1 %
Monocytes Absolute: 0.8 10*3/uL (ref 0.1–1.0)
Monocytes Relative: 3 %
Neutro Abs: 21.7 10*3/uL — ABNORMAL HIGH (ref 1.7–7.7)
Neutrophils Relative %: 83 %
Platelets: 624 10*3/uL — ABNORMAL HIGH (ref 150–400)
RBC: 1.13 MIL/uL — ABNORMAL LOW (ref 3.87–5.11)
RDW: 14.9 % (ref 11.5–15.5)
WBC: 26.1 10*3/uL — ABNORMAL HIGH (ref 4.0–10.5)
nRBC: 1.1 % — ABNORMAL HIGH (ref 0.0–0.2)

## 2022-01-29 LAB — LACTATE DEHYDROGENASE: LDH: 726 U/L — ABNORMAL HIGH (ref 98–192)

## 2022-01-29 LAB — CBC
HCT: 10.6 % — ABNORMAL LOW (ref 36.0–46.0)
Hemoglobin: 3.8 g/dL — CL (ref 12.0–15.0)
MCH: 36.2 pg — ABNORMAL HIGH (ref 26.0–34.0)
MCHC: 35.8 g/dL (ref 30.0–36.0)
MCV: 101 fL — ABNORMAL HIGH (ref 80.0–100.0)
Platelets: 684 10*3/uL — ABNORMAL HIGH (ref 150–400)
RBC: 1.05 MIL/uL — ABNORMAL LOW (ref 3.87–5.11)
RDW: 14.8 % (ref 11.5–15.5)
WBC: 25.2 10*3/uL — ABNORMAL HIGH (ref 4.0–10.5)
nRBC: 1.6 % — ABNORMAL HIGH (ref 0.0–0.2)

## 2022-01-29 LAB — PREPARE RBC (CROSSMATCH)

## 2022-01-29 LAB — POC OCCULT BLOOD, ED: Fecal Occult Bld: POSITIVE — AB

## 2022-01-29 MED ORDER — SODIUM CHLORIDE 0.9 % IV SOLN: INTRAVENOUS | Status: AC

## 2022-01-29 MED ORDER — SODIUM CHLORIDE 0.9% FLUSH
3.0000 mL | Freq: Two times a day (BID) | INTRAVENOUS | Status: DC
  Administered 2022-01-29 – 2022-01-30 (×2): 3 mL via INTRAVENOUS

## 2022-01-29 MED ORDER — ACETAMINOPHEN 650 MG RE SUPP: 650.0000 mg | Freq: Four times a day (QID) | RECTAL | Status: DC | PRN

## 2022-01-29 MED ORDER — SERTRALINE HCL 50 MG PO TABS
25.0000 mg | ORAL_TABLET | Freq: Every day | ORAL | Status: DC
  Administered 2022-01-30: 25 mg via ORAL
  Filled 2022-01-29: qty 1

## 2022-01-29 MED ORDER — ACETAMINOPHEN 325 MG PO TABS
650.0000 mg | ORAL_TABLET | Freq: Four times a day (QID) | ORAL | Status: DC | PRN
Start: 2022-01-29 — End: 2022-01-30

## 2022-01-29 MED ORDER — POLYETHYLENE GLYCOL 3350 17 G PO PACK: 17.0000 g | PACK | Freq: Every day | ORAL | Status: DC | PRN

## 2022-01-29 MED ORDER — FOLIC ACID 1 MG PO TABS
2.0000 mg | ORAL_TABLET | Freq: Every day | ORAL | Status: DC
  Administered 2022-01-30: 2 mg via ORAL
  Filled 2022-01-29: qty 2

## 2022-01-29 MED ORDER — SODIUM CHLORIDE 0.9% IV SOLUTION: Freq: Once | INTRAVENOUS | Status: AC

## 2022-01-29 MED ORDER — VITAMIN B-12 1000 MCG PO TABS
2000.0000 ug | ORAL_TABLET | Freq: Every day | ORAL | Status: DC
  Administered 2022-01-30: 2000 ug via ORAL
  Filled 2022-01-29: qty 2

## 2022-01-29 MED ORDER — QUETIAPINE FUMARATE 50 MG PO TABS
25.0000 mg | ORAL_TABLET | Freq: Every day | ORAL | Status: DC
  Administered 2022-01-29: 25 mg via ORAL
  Filled 2022-01-29: qty 1

## 2022-01-29 MED ORDER — PANTOPRAZOLE SODIUM 40 MG PO TBEC
40.0000 mg | DELAYED_RELEASE_TABLET | Freq: Every day | ORAL | Status: DC
  Administered 2022-01-30: 40 mg via ORAL
  Filled 2022-01-29: qty 1

## 2022-01-29 MED ORDER — HALOPERIDOL LACTATE 5 MG/ML IJ SOLN
2.0000 mg | Freq: Once | INTRAMUSCULAR | Status: AC
  Administered 2022-01-29: 2 mg via INTRAVENOUS
  Filled 2022-01-29: qty 1

## 2022-01-29 MED ORDER — POTASSIUM CHLORIDE 20 MEQ PO PACK
60.0000 meq | PACK | Freq: Once | ORAL | Status: AC
  Administered 2022-01-29: 60 meq via ORAL
  Filled 2022-01-29: qty 3

## 2022-01-29 NOTE — ED Provider Notes (Signed)
?[facility] DEPT ?Provider Note ? ? ?CSN: [id number] ?Arrival date & time: 01/29/22  1525 ? ?  ? ?History ? ?Chief Complaint  ?Patient presents with  ? Abnormal Lab  ?  Hgb 3.3  ? ? ?[name] is a 81 y.o. female. ? ?81 yo F with a chief complaint of low hemoglobin.  This was checked at her skilled nursing facility earlier today.  Patient denies any symptoms significant with anemia.  Tells me that she feels at her baseline.  Denies any dark stool or blood in her stool.  Denies any abdominal discomfort.  Hemoglobin reportedly was 3.3.  She denies any blood thinner use. ? ? ?Abnormal Lab ? ?  ? ?Home Medications ?Prior to Admission medications   ?Medication Sig Start Date End Date Taking? Authorizing Provider  ?acetaminophen (TYLENOL) 500 MG tablet Take 500 mg by mouth every 6 (six) hours as needed for moderate pain.   Yes [provider]  ?docusate sodium (COLACE) 100 MG capsule Take 1 capsule (100 mg total) by mouth 2 (two) times daily. 10/09/21  Yes [name], Ripudeep K, MD  ?folic acid (FOLVITE) 1 MG tablet Take 2 tablets (2 mg total) by mouth daily. 10/10/21  Yes [name], Ripudeep K, MD  ?loratadine (CLARITIN) 10 MG tablet Take 10 mg by mouth daily as needed for allergies.   Yes [provider]  ?Multiple Vitamin (MULTIVITAMIN WITH MINERALS) TABS tablet Take 1 tablet by mouth daily. 10/10/21  Yes [name], Ripudeep K, MD  ?OXYGEN Inhale 2 L into the lungs See admin instructions. To maintain SAT greater than 90%   Yes [provider]  ?pantoprazole (PROTONIX) 40 MG tablet Take 1 tablet (40 mg total) by mouth daily at 6 (six) AM. 10/10/21  Yes [name], Ripudeep K, MD  ?polyethylene glycol (MIRALAX / GLYCOLAX) 17 g packet Take 17 g by mouth daily.   Yes [provider]  ?PRESCRIPTION MEDICATION Apply 1 application. topically See admin instructions. Medication: Lorazepam powder. Apply 1 mg gel topically to skin every 12 hours as needed for agitation   Yes [provider]  ?QUEtiapine (SEROQUEL) 25 MG tablet Take 1 tablet (25 mg total) by mouth at bedtime. 10/09/21  Yes [name], Ripudeep K, MD  ?risperiDONE (RISPERDAL) 0.5 MG tablet Take 0.5 mg by mouth 2 (two) times daily.   Yes [provider]  ?sertraline (ZOLOFT) 25 MG tablet Take 25 mg by mouth daily. 10/23/21  Yes [provider]  ?vitamin B-12 2000 MCG tablet Take 1 tablet (2,000 mcg total) by mouth daily. 08/21/21  Yes [name], MD  ?aspirin EC 325 MG tablet Take 1 tablet (325 mg total) by mouth daily. X 5 weeks for DVT prophylaxis ?Patient not taking: Reported on 01/29/2022 10/09/21   Mendel Corning, MD  ?PRESCRIPTION MEDICATION Take 90 mLs by mouth daily. Med Pass 2.0    [provider]  ?   ? ?Allergies    ?Other and Shellfish allergy   ? ?Review of Systems   ?Review of Systems ? ?Physical Exam ?Updated Vital Signs ?BP (!) 104/35   Pulse 82   Temp 98.7 ?F (37.1 ?C) (Oral)   Resp 16   Ht '5\' 1"'$  (1.549 m)   Wt 40.8 kg   SpO2 99%   BMI 17.01 kg/m?  ?Physical Exam ?Vitals and nursing note reviewed.  ?Constitutional:   ?   General: She is not in acute distress. ?   Appearance: She is well-developed. She is not  diaphoretic.  ?   Comments: Jaundiced  ?HENT:  ?   Head: Normocephalic and atraumatic.  ?Eyes:  ?   Pupils: Pupils are equal, round, and reactive to light.  ?Cardiovascular:  ?   Rate and Rhythm: Normal rate and regular rhythm.  ?   Heart sounds: No murmur heard. ?  No friction rub. No gallop.  ?Pulmonary:  ?   Effort: Pulmonary effort is normal.  ?   Breath sounds: No wheezing or rales.  ?Abdominal:  ?   General: There is no distension.  ?   Palpations: Abdomen is soft.  ?   Tenderness: There is no abdominal tenderness.  ?Musculoskeletal:     ?   General: No tenderness.  ?   Cervical back: Normal range of motion and neck supple.  ?Skin: ?   General: Skin is warm and dry.  ?Neurological:  ?   Mental Status: She is alert and oriented to person, place, and time.  ?Psychiatric:      ?   Behavior: Behavior normal.  ? ? ?ED Results / Procedures / Treatments   ?Labs ?(all labs ordered are listed, but only abnormal results are displayed) ?Labs Reviewed  ?COMPREHENSIVE METABOLIC PANEL - Abnormal; Notable for the following components:  ?    Result Value  ? Sodium 132 (*)   ? Potassium 2.9 (*)   ? Chloride 95 (*)   ? Glucose, Bld 111 (*)   ? Calcium 7.5 (*)   ? Total Protein 5.4 (*)   ? Albumin 3.1 (*)   ? AST 48 (*)   ? Total Bilirubin 2.5 (*)   ? All other components within normal limits  ?CBC - Abnormal; Notable for the following components:  ? WBC 25.2 (*)   ? RBC 1.05 (*)   ? Hemoglobin 3.8 (*)   ? HCT 10.6 (*)   ? MCV 101.0 (*)   ? MCH 36.2 (*)   ? Platelets 684 (*)   ? nRBC 1.6 (*)   ? All other components within normal limits  ?LACTATE DEHYDROGENASE - Abnormal; Notable for the following components:  ? LDH 726 (*)   ? All other components within normal limits  ?DIFFERENTIAL - Abnormal; Notable for the following components:  ? Neutro Abs 17.1 (*)   ? Lymphs Abs 5.3 (*)   ? Monocytes Absolute 2.3 (*)   ? All other components within normal limits  ?COMPREHENSIVE METABOLIC PANEL - Abnormal; Notable for the following components:  ? Potassium 2.8 (*)   ? Glucose, Bld 102 (*)   ? Calcium 7.5 (*)   ? Total Protein 5.4 (*)   ? Albumin 3.1 (*)   ? AST 43 (*)   ? Total Bilirubin 2.4 (*)   ? All other components within normal limits  ?CBC WITH DIFFERENTIAL/PLATELET - Abnormal; Notable for the following components:  ? WBC 26.1 (*)   ? RBC 1.13 (*)   ? Hemoglobin 3.8 (*)   ? HCT 11.4 (*)   ? MCV 100.9 (*)   ? Platelets 624 (*)   ? nRBC 1.1 (*)   ? Neutro Abs 21.7 (*)   ? Basophils Absolute 0.5 (*)   ? Abs Immature Granulocytes 0.30 (*)   ? All other components within normal limits  ?POC OCCULT BLOOD, ED - Abnormal; Notable for the following components:  ? Fecal Occult Bld POSITIVE (*)   ? All other components within normal limits  ?CBC WITH DIFFERENTIAL/PLATELET  ?CBC  ?COMPREHENSIVE METABOLIC PANEL   ?  MAGNESIUM  ?I-STAT CHEM 8, ED  ?TYPE AND SCREEN  ?PREPARE RBC (CROSSMATCH)  ? ? ?EKG ?None ? ?Radiology ?No results found. ? ?Procedures ?Procedures  ? ? ?Medications Ordered in ED ?Medications  ?0.9 %  sodium chloride infusion (Manually program via Guardrails IV Fluids) (has no administration in time range)  ?QUEtiapine (SEROQUEL) tablet 25 mg (has no administration in time range)  ?sertraline (ZOLOFT) tablet 25 mg (has no administration in time range)  ?pantoprazole (PROTONIX) EC tablet 40 mg (has no administration in time range)  ?folic acid (FOLVITE) tablet 2 mg (has no administration in time range)  ?sodium chloride flush (NS) 0.9 % injection 3 mL (has no administration in time range)  ?polyethylene glycol (MIRALAX / GLYCOLAX) packet 17 g (has no administration in time range)  ?acetaminophen (TYLENOL) tablet 650 mg (has no administration in time range)  ?  Or  ?acetaminophen (TYLENOL) suppository 650 mg (has no administration in time range)  ?0.9 %  sodium chloride infusion ( Intravenous New Bag/Given 01/29/22 2222)  ?potassium chloride (KLOR-CON) packet 60 mEq (has no administration in time range)  ?vitamin B-12 (CYANOCOBALAMIN) tablet 2,000 mcg (has no administration in time range)  ?haloperidol lactate (HALDOL) injection 2 mg (2 mg Intravenous Given 01/29/22 2220)  ? ? ?ED Course/ Medical Decision Making/ A&P ?  ?                        ?Medical Decision Making ?Amount and/or Complexity of Data Reviewed ?Labs: ordered. ? ?Risk ?Prescription drug management. ?Decision regarding hospitalization. ? ? ?59 y oF with a chief complaints of hemoglobin of 3.3.  Patient tells me that this was checked today and she is not sure exactly why.  Rechecked here and was 3.8.  Of note after which we realized that the patient has a history of a cold agglutinin.  She sees heme-onc for this.  They recommended their note that the blood be checked and warm to tubes or otherwise it might be falsely low.  Will recollect.  She also was  hypotensive here.  She was given a small bolus of IV fluids with some improvement. ? ?Patient's repeat hemoglobin is unchanged from initial.  We will transfuse.  Will discuss with medicine. ? ?CRITICAL CARE ?Per

## 2022-01-29 NOTE — ED Triage Notes (Signed)
Patient BIB EMS from SNF ([facility]) c/o hgb of 3.3. patient denies of N/V. No report of Blood in stool. Pt has DNR. ?

## 2022-01-29 NOTE — H&P (Signed)
?History and Physical  ? ?[name]:[id number] DOB: 07-Dec-1940 DOA: 01/29/2022 ? ?PCP: [name], MD  ? ?Patient coming from: Skilled nursing facility ? ?Chief Complaint: Abnormal hemoglobin ? ?HPI: [name] is a 81 y.o. female with medical history significant of cold agglutinin disease, autoimmune hemolytic anemia, COPD, dementia, diverticulosis presenting with low hemoglobin. ? ?History obtained assistance of chart review and family due to patient's dementia.  Hemoglobin was checked at facility today and hemoglobin was noted to be 3.3 patient sent to the ED for further evaluation.  Confirmed hemoglobin 3.8 in the ED.  Denies any significant symptoms possibly chills.  Patient resides at skilled nursing facility and does not ambulate much.  ? ?She reports some chills as above.  Denies fevers, chest pain, shortness of breath, abdominal pain, constipation, diarrhea,, vomiting. Denies any dark or bloody stools. ? ?ED Course: Vital signs in the ED significant for blood pressure in the 41P 379 systolic with low maps.  Lab work-up included CMP with potassium 2.8, calcium 7.5, protein 5.4, albumin 3.1, AST 43, T. bili 2.4 from baseline around 2.  Hemoglobin of 3.8 from baseline in the 7 range.  FOBT mildly positive per EDP LDH stable from 3 months ago at 700 but elevated from previous.  Patient had 2 units of blood ordered through blood warmer. ? ?Review of Systems: As per HPI otherwise all other systems reviewed and are negative. ? ?Past Medical History:  ?Diagnosis Date  ? Aortic atherosclerosis ([place]) 08/09/2021  ? B12 deficiency   ? Bilateral carotid artery stenosis   ? right CCA and ICA 1-39%/    left ICA 40-59%  per last duplex  10-19-2015  ? COPD (chronic obstructive pulmonary disease) ([place])   ? Diverticulosis of colon   ? History of diverticulitis of colon   ? 2004  ? 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 montiored by dr Marko Plume  ? Intraventricular hemorrhage ([place])  10/03/2021  ? Renal and ureteric calculus   ? left  ? ? ?Past Surgical History:  ?Procedure Laterality Date  ? ABDOMINAL HYSTERECTOMY  1986  ? APPENDECTOMY  1967  ? BREAST EXCISIONAL BIOPSY Right 1984  ? CATARACT EXTRACTION W/ INTRAOCULAR LENS IMPLANT Right 1990's  ? congential cataract  ? CYSTOSCOPY W/ URETERAL STENT PLACEMENT  11/12/2015  ? Procedure: CYSTOSCOPY WITH RETROGRADE PYELOGRAM/URETERAL STENT PLACEMENT;  Surgeon: [name], MD;  Location: [facility];  Service: Urology;;  ? CYSTOSCOPY W/ URETERAL STENT REMOVAL Left 11/22/2015  ? Procedure: CYSTOSCOPY WITH STENT REMOVAL;  Surgeon: [name], MD;  Location: [facility];  Service: Urology;  Laterality: Left;  ? CYSTOSCOPY/URETEROSCOPY/HOLMIUM LASER Left 11/22/2015  ? Procedure: CYSTOSCOPY/URETEROSCOPY/HOLMIUM LASER;  Surgeon: [name], MD;  Location: [facility];  Service: Urology;  Laterality: Left;  FLEX URETEROSCOPY  ? FEMUR IM NAIL Left 08/04/2015  ? Procedure: INTRAMEDULLARY (IM) NAIL FEMORAL;  Surgeon: [name], MD;  Location: [facility];  Service: Orthopedics;  Laterality: Left;  ? INTRAMEDULLARY (IM) NAIL INTERTROCHANTERIC Right 10/04/2021  ? Procedure: INTRAMEDULLARY (IM) NAIL INTERTROCHANTRIC;  Surgeon: [name], MD;  Location: [employer];  Service: Orthopedics;  Laterality: Right;  ? IR KYPHO THORACIC WITH BONE BIOPSY  06/22/2018  ? TONSILLECTOMY  age 1  ? ? ?Social History ? reports that she quit smoking about 6 years ago. Her smoking use included cigarettes. She smoked an average of 1 pack per day. She has never used smokeless tobacco. She reports current alcohol use. She reports  that she does not use drugs. ? ?Allergies  ?Allergen Reactions  ? Other Other (See Comments)  ?  "Most antibiotics cause yeast infections"  ? Shellfish Allergy Hives  ? ? ?Family History  ?Problem Relation Age of Onset  ? Breast cancer Cousin   ?Reviewed on admission ? ?Prior to Admission medications   ?Medication Sig Start Date End Date  Taking? Authorizing Provider  ?acetaminophen (TYLENOL) 500 MG tablet Take 500 mg by mouth every 6 (six) hours as needed for moderate pain. ?Patient not taking: Reported on 10/31/2021    [provider]  ?aspirin EC 325 MG tablet Take 1 tablet (325 mg total) by mouth daily. X 5 weeks for DVT prophylaxis ?Patient taking differently: Take 325 mg by mouth daily. For DVT Prophylaxis 10/09/21   [name], [name], MD  ?docusate sodium (COLACE) 100 MG capsule Take 1 capsule (100 mg total) by mouth 2 (two) times daily. 10/09/21   [name], [name], MD  ?folic acid (FOLVITE) 1 MG tablet Take 2 tablets (2 mg total) by mouth daily. 10/10/21   [name], [name], MD  ?loratadine (CLARITIN) 10 MG tablet Take 10 mg by mouth daily as needed for allergies. ?Patient not taking: Reported on 10/31/2021    [provider]  ?Multiple Vitamin (MULTIVITAMIN WITH MINERALS) TABS tablet Take 1 tablet by mouth daily. 10/10/21   [name], [name], MD  ?OXYGEN Inhale 2 L into the lungs See admin instructions. To maintain SAT greater than 90%    [provider]  ?pantoprazole (PROTONIX) 40 MG tablet Take 1 tablet (40 mg total) by mouth daily at 6 (six) AM. 10/10/21   [name], [name], MD  ?PRESCRIPTION MEDICATION Take 90 mLs by mouth daily. Med Pass 2.0    [provider]  ?QUEtiapine (SEROQUEL) 25 MG tablet Take 1 tablet (25 mg total) by mouth at bedtime. 10/09/21   [name], [name], MD  ?sertraline (ZOLOFT) 25 MG tablet Take 25 mg by mouth daily. 10/23/21   [provider]  ?vitamin B-12 2000 MCG tablet Take 1 tablet (2,000 mcg total) by mouth daily. 08/21/21   [name], MD  ? ? ?Physical Exam: ?Vitals:  ? 01/29/22 1900 01/29/22 1950 01/29/22 2000 01/29/22 2010  ?BP: (!) 143/102 (!) 96/37 (!) 94/34 (!) 104/35  ?Pulse: 89 81 78 82  ?Resp: 20 (!) 23 (!) 21 16  ?Temp:      ?TempSrc:      ?SpO2: 100% 100% 100% 99%  ?Weight:      ?Height:      ? ? ?Physical Exam ?Constitutional:   ?   General: She is not in acute  distress. ?   Appearance: Normal appearance.  ?HENT:  ?   Head: Normocephalic and atraumatic.  ?   Mouth/Throat:  ?   Mouth: Mucous membranes are moist.  ?   Pharynx: Oropharynx is clear.  ?Eyes:  ?   Extraocular Movements: Extraocular movements intact.  ?   Pupils: Pupils are equal, round, and reactive to light.  ?   Comments: Pale conjuctiva  ?Cardiovascular:  ?   Rate and Rhythm: Normal rate and regular rhythm.  ?   Pulses: Normal pulses.  ?   Heart sounds: Normal heart sounds.  ?Pulmonary:  ?   Effort: Pulmonary effort is normal. No respiratory distress.  ?   Breath sounds: Normal breath sounds.  ?Abdominal:  ?   General: Bowel sounds are normal. There is no distension.  ?   Palpations: Abdomen is  soft.  ?   Tenderness: There is no abdominal tenderness.  ?Musculoskeletal:     ?   General: No swelling or deformity.  ?Skin: ?   General: Skin is warm and dry.  ?   Coloration: Skin is pale.  ?Neurological:  ?   General: No focal deficit present.  ?   Mental Status: Mental status is at baseline.  ?   Comments: Chronic dementia  ? ?Labs on Admission: I have personally reviewed following labs and imaging studies ? ?CBC: ?Recent Labs  ?Lab 01/29/22 ?1543 01/29/22 ?1544 01/29/22 ?1825  ?WBC 25.2*  --  26.1*  ?NEUTROABS  --  17.1* 21.7*  ?HGB 3.8*  --  3.8*  ?HCT 10.6*  --  11.4*  ?MCV 101.0*  --  100.9*  ?PLT 684*  --  624*  ? ? ?Basic Metabolic Panel: ?Recent Labs  ?Lab 01/29/22 ?1544 01/29/22 ?1825  ?NA 132* 137  ?K 2.9* 2.8*  ?CL 95* 99  ?CO2 26 28  ?GLUCOSE 111* 102*  ?BUN 18 16  ?CREATININE 0.54 0.53  ?CALCIUM 7.5* 7.5*  ? ? ?GFR: ?Estimated Creatinine Clearance: 36.1 mL/min (by C-G formula based on SCr of 0.53 mg/dL). ? ?Liver Function Tests: ?Recent Labs  ?Lab 01/29/22 ?1544 01/29/22 ?1825  ?AST 48* 43*  ?ALT 18 18  ?ALKPHOS 101 99  ?BILITOT 2.5* 2.4*  ?PROT 5.4* 5.4*  ?ALBUMIN 3.1* 3.1*  ? ? ?Urine analysis: ?   ?Component Value Date/Time  ? [place] YELLOW 10/30/2021 1510  ? APPEARANCEUR CLEAR 10/30/2021 1510   ? LABSPEC 1.003 (L) 10/30/2021 1510  ? PHURINE 5.0 10/30/2021 1510  ? GLUCOSEU NEGATIVE 10/30/2021 1510  ? HGBUR SMALL (A) 10/30/2021 1510  ? [place] NEGATIVE 10/30/2021 1510  ? KETONESUR NEGATIVE 10/30/2021

## 2022-01-29 NOTE — ED Notes (Signed)
Lab has been notified to redraw HGB in warm tubes. They will come obtain. If transfusion is ordered, please use BLOOD WARMER. ?

## 2022-01-30 DIAGNOSIS — D591 Autoimmune hemolytic anemia, unspecified: Secondary | ICD-10-CM | POA: Diagnosis not present

## 2022-01-30 DIAGNOSIS — D649 Anemia, unspecified: Secondary | ICD-10-CM | POA: Diagnosis not present

## 2022-01-30 LAB — CBC
HCT: 23.6 % — ABNORMAL LOW (ref 36.0–46.0)
Hemoglobin: 8.7 g/dL — ABNORMAL LOW (ref 12.0–15.0)
MCH: 37.7 pg — ABNORMAL HIGH (ref 26.0–34.0)
MCHC: 36.9 g/dL — ABNORMAL HIGH (ref 30.0–36.0)
MCV: 102.2 fL — ABNORMAL HIGH (ref 80.0–100.0)
Platelets: 643 10*3/uL — ABNORMAL HIGH (ref 150–400)
RBC: 2.31 MIL/uL — ABNORMAL LOW (ref 3.87–5.11)
RDW: 21.4 % — ABNORMAL HIGH (ref 11.5–15.5)
WBC: 26 10*3/uL — ABNORMAL HIGH (ref 4.0–10.5)
nRBC: 1.1 % — ABNORMAL HIGH (ref 0.0–0.2)

## 2022-01-30 LAB — COMPREHENSIVE METABOLIC PANEL
ALT: 16 U/L (ref 0–44)
AST: 40 U/L (ref 15–41)
Albumin: 3.3 g/dL — ABNORMAL LOW (ref 3.5–5.0)
Alkaline Phosphatase: 109 U/L (ref 38–126)
Anion gap: 8 (ref 5–15)
BUN: 10 mg/dL (ref 8–23)
CO2: 27 mmol/L (ref 22–32)
Calcium: 7.7 mg/dL — ABNORMAL LOW (ref 8.9–10.3)
Chloride: 104 mmol/L (ref 98–111)
Creatinine, Ser: 0.33 mg/dL — ABNORMAL LOW (ref 0.44–1.00)
GFR, Estimated: >60 mL/min (ref >60)
Glucose, Bld: 92 mg/dL (ref 70–99)
Potassium: 3.7 mmol/L (ref 3.5–5.1)
Sodium: 139 mmol/L (ref 135–145)
Total Bilirubin: 3.6 mg/dL — ABNORMAL HIGH (ref 0.3–1.2)
Total Protein: 6 g/dL — ABNORMAL LOW (ref 6.5–8.1)

## 2022-01-30 LAB — MAGNESIUM: Magnesium: 1.9 mg/dL (ref 1.7–2.4)

## 2022-01-30 NOTE — ED Notes (Signed)
Pt is very confused, and pt keeps putting legs through the bed rails. Seizure pads were placed on bed rails for the safety of this pt.  ?

## 2022-01-30 NOTE — Discharge Summary (Signed)
?Physician Discharge Summary  ? ?[name] VFI:[id number] DOB: December 22, 1940 DOA: 01/29/2022 ? ?PCP: [name], MD ? ?Admit date: 01/29/2022 ?Discharge date: 01/30/2022 ? ?Admitted From: [name] ?Disposition:  [name] ?Discharging physician: Dwyane Dee, MD ? ?Recommendations for Outpatient Follow-up:  ?Repeat hemoglobin check by 02/04/22 ? ?Discharge Condition: stable ?CODE STATUS: DNR ?Diet recommendation:  ?Diet Orders (From admission, onward)  ? ?  Start     Ordered  ? 01/30/22 0000  Diet general       ? 01/30/22 1145  ? 01/29/22 2116  Diet regular Room service appropriate? Yes; Fluid consistency: Thin  Diet effective now       ?Question Answer Comment  ?Room service appropriate? Yes   ?Fluid consistency: Thin   ?  ? 01/29/22 2121  ? ?  ?  ? ?  ? ? ?Assessment and Plan: ? ?Symptomatic anemia ?Cold agglutinin disease ?Hemolytic anemia ?> Patient presenting with severe anemia and chills.  Hemoglobin noted to be 3.8 on repeat and was 3.3 at facility.  Baseline in the sevens. ?> Follows with hematology and receives as needed transfusions for her cold agglutinin hemolytic anemia.  Has had Rituxan in the past.  And prior admissions for symptomatic anemia. ?-Received 2 units PRBC on admission.  Repeat hemoglobin 8.7 g/dL after blood transfusions ?-Mentation also improved back to baseline in setting of dementia. ?-Also called and spoke with daughter who confirmed mentation back to baseline ?-Patient has upcoming repeat hemoglobin outpatient recheck planned for 02/04/2022, encouraged family to keep this check ? ? ?Dementia ?> Has history of delirium and sundowning. ?-Mentation back to baseline prior to discharge ?- Continue home quetiapine and sertraline ?  ?COPD ?-No signs or symptoms of exacerbation at this time ? ? ?Principal Diagnosis: Symptomatic anemia ? ?Discharge Diagnoses: ?Principal Problem: ?  Symptomatic anemia ?Active Problems: ?  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  Autoimmune  hemolytic anemia ([facility]) ?  COPD (chronic obstructive pulmonary disease) ([place]) ?  Dementia ([place]) ? ? ?Discharge Instructions   ? ? Diet general   Complete by: As directed ?  ? Discharge instructions   Complete by: As directed ?  ? Obtain a hemoglobin check by 02/04/22  ? ?  ? ?Allergies as of 01/30/2022   ? ?   Reactions  ? Other Other (See Comments)  ? "Most antibiotics cause yeast infections"  ? Shellfish Allergy Hives  ? ?  ? ?  ?Medication List  ?  ? ?STOP taking these medications   ? ?aspirin EC 325 MG tablet ?  ? ?  ? ?TAKE these medications   ? ?acetaminophen 500 MG tablet ?Commonly known as: TYLENOL ?Take 500 mg by mouth every 6 (six) hours as needed for moderate pain. ?  ?cyanocobalamin 2000 MCG tablet ?Take 1 tablet (2,000 mcg total) by mouth daily. ?  ?docusate sodium 100 MG capsule ?Commonly known as: COLACE ?Take 1 capsule (100 mg total) by mouth 2 (two) times daily. ?  ?folic acid 1 MG tablet ?Commonly known as: FOLVITE ?Take 2 tablets (2 mg total) by mouth daily. ?  ?loratadine 10 MG tablet ?Commonly known as: CLARITIN ?Take 10 mg by mouth daily as needed for allergies. ?  ?multivitamin with minerals Tabs tablet ?Take 1 tablet by mouth daily. ?  ?OXYGEN ?Inhale 2 L into the lungs See admin instructions. To maintain SAT greater than 90% ?  ?pantoprazole 40 MG tablet ?Commonly known as: PROTONIX ?Take 1 tablet (40 mg total) by mouth daily at 6 (  six) AM. ?  ?polyethylene glycol 17 g packet ?Commonly known as: MIRALAX / GLYCOLAX ?Take 17 g by mouth daily. ?  ?PRESCRIPTION MEDICATION ?Take 90 mLs by mouth daily. Med Pass 2.0 ?  ?PRESCRIPTION MEDICATION ?Apply 1 application. topically See admin instructions. Medication: Lorazepam powder. Apply 1 mg gel topically to skin every 12 hours as needed for agitation ?  ?QUEtiapine 25 MG tablet ?Commonly known as: SEROquel ?Take 1 tablet (25 mg total) by mouth at bedtime. ?  ?risperiDONE 0.5 MG tablet ?Commonly known as: RISPERDAL ?Take 0.5 mg by mouth 2 (two) times  daily. ?  ?sertraline 25 MG tablet ?Commonly known as: ZOLOFT ?Take 25 mg by mouth daily. ?  ? ?  ? ? ?Allergies  ?Allergen Reactions  ? Other Other (See Comments)  ?  "Most antibiotics cause yeast infections"  ? Shellfish Allergy Hives  ? ? ?Consultations: ? ? ?Discharge Exam: ?BP (!) 120/47   Pulse 61   Temp 98.6 ?F (37 ?C) (Oral)   Resp 15   Ht '5\' 1"'$  (1.549 m)   Wt 40.8 kg   SpO2 100%   BMI 17.01 kg/m?  ?Physical Exam ?Constitutional:   ?   General: She is not in acute distress. ?HENT:  ?   Head: Normocephalic and atraumatic.  ?   Mouth/Throat:  ?   Mouth: Mucous membranes are moist.  ?Eyes:  ?   Extraocular Movements: Extraocular movements intact.  ?Cardiovascular:  ?   Rate and Rhythm: Normal rate and regular rhythm.  ?Pulmonary:  ?   Effort: Pulmonary effort is normal.  ?   Breath sounds: Normal breath sounds.  ?Abdominal:  ?   General: Bowel sounds are normal. There is no distension.  ?   Palpations: Abdomen is soft.  ?   Tenderness: There is no abdominal tenderness.  ?Musculoskeletal:     ?   General: Normal range of motion.  ?   Cervical back: Normal range of motion and neck supple.  ?Skin: ?   General: Skin is warm and dry.  ?Neurological:  ?   Mental Status: She is alert. Mental status is at baseline.  ?Psychiatric:     ?   Mood and Affect: Mood normal.  ?  ? ?The results of significant diagnostics from this hospitalization (including imaging, microbiology, ancillary and laboratory) are listed below for reference.   ?Microbiology: ?No results found for this or any previous visit (from the past 240 hour(s)).  ? ?Labs: ?BNP (last 3 results) ?No results for input(s): BNP in the last 8760 hours. ?Basic Metabolic Panel: ?Recent Labs  ?Lab 01/29/22 ?1544 01/29/22 ?1825 01/29/22 ?2200 01/30/22 ?0855  ?NA 132* 137  --  139  ?K 2.9* 2.8*  --  3.7  ?CL 95* 99  --  104  ?CO2 26 28  --  27  ?GLUCOSE 111* 102*  --  92  ?BUN 18 16  --  10  ?CREATININE 0.54 0.53  --  0.33*  ?CALCIUM 7.5* 7.5*  --  7.7*  ?MG  --    --  1.9  --   ? ?Liver Function Tests: ?Recent Labs  ?Lab 01/29/22 ?1544 01/29/22 ?1825 01/30/22 ?0855  ?AST 48* 43* 40  ?ALT '18 18 16  '$ ?ALKPHOS 101 99 109  ?BILITOT 2.5* 2.4* 3.6*  ?PROT 5.4* 5.4* 6.0*  ?ALBUMIN 3.1* 3.1* 3.3*  ? ?No results for input(s): LIPASE, AMYLASE in the last 168 hours. ?No results for input(s): AMMONIA in the last 168 hours. ?CBC: ?Recent  Labs  ?Lab 01/29/22 ?1543 01/29/22 ?1544 01/29/22 ?1825 01/30/22 ?0855  ?WBC 25.2*  --  26.1* 26.0*  ?NEUTROABS  --  17.1* 21.7*  --   ?HGB 3.8*  --  3.8* 8.7*  ?HCT 10.6*  --  11.4* 23.6*  ?MCV 101.0*  --  100.9* 102.2*  ?PLT 684*  --  624* 643*  ? ?Cardiac Enzymes: ?No results for input(s): CKTOTAL, CKMB, CKMBINDEX, TROPONINI in the last 168 hours. ?BNP: ?Invalid input(s): POCBNP ?CBG: ?No results for input(s): GLUCAP in the last 168 hours. ?D-Dimer ?No results for input(s): DDIMER in the last 72 hours. ?Hgb A1c ?No results for input(s): HGBA1C in the last 72 hours. ?Lipid Profile ?No results for input(s): CHOL, HDL, LDLCALC, TRIG, CHOLHDL, LDLDIRECT in the last 72 hours. ?Thyroid function studies ?No results for input(s): TSH, T4TOTAL, T3FREE, THYROIDAB in the last 72 hours. ? ?Invalid input(s): FREET3 ?Anemia work up ?No results for input(s): VITAMINB12, FOLATE, FERRITIN, TIBC, IRON, RETICCTPCT in the last 72 hours. ?Urinalysis ?   ?Component Value Date/Time  ? [place] YELLOW 10/30/2021 1510  ? APPEARANCEUR CLEAR 10/30/2021 1510  ? LABSPEC 1.003 (L) 10/30/2021 1510  ? PHURINE 5.0 10/30/2021 1510  ? GLUCOSEU NEGATIVE 10/30/2021 1510  ? HGBUR SMALL (A) 10/30/2021 1510  ? [place] NEGATIVE 10/30/2021 1510  ? [place] NEGATIVE 10/30/2021 1510  ? PROTEINUR NEGATIVE 10/30/2021 1510  ? NITRITE NEGATIVE 10/30/2021 1510  ? LEUKOCYTESUR NEGATIVE 10/30/2021 1510  ? ?Sepsis Labs ?Invalid input(s): PROCALCITONIN,  WBC,  LACTICIDVEN ?Microbiology ?No results found for this or any previous visit (from the past 240 hour(s)). ? ?Procedures/Studies: ?No results  found. ? ? ?Time coordinating discharge: Over 30 minutes ? ? ? ?Dwyane Dee, MD  ?[facility] ?01/30/2022, 11:45 AM ?

## 2022-01-30 NOTE — ED Notes (Signed)
Patient toileted and linen changed. Patient provided with more warm blankets and a new clean gown. Patient is now resting comfortably in bed.  ?

## 2022-01-30 NOTE — ED Notes (Signed)
[facility] called and report given to Maudie Mercury at [facility]. ?

## 2022-01-31 LAB — TYPE AND SCREEN
ABO/RH(D): B POS
Antibody Screen: NEGATIVE
Unit division: 0
Unit division: 0

## 2022-01-31 LAB — BPAM RBC
Blood Product Expiration Date: 202306052359
Blood Product Expiration Date: 202306052359
ISSUE DATE / TIME: 202305092223
ISSUE DATE / TIME: 202305100238
Unit Type and Rh: 5100
Unit Type and Rh: 5100

## 2022-02-04 ENCOUNTER — Telehealth: Payer: Self-pay | Admitting: *Deleted

## 2022-02-04 ENCOUNTER — Encounter (HOSPITAL_COMMUNITY): Payer: Self-pay

## 2022-02-04 ENCOUNTER — Inpatient Hospital Stay (HOSPITAL_BASED_OUTPATIENT_CLINIC_OR_DEPARTMENT_OTHER): Payer: Medicare Other | Admitting: Hematology

## 2022-02-04 ENCOUNTER — Emergency Department (HOSPITAL_COMMUNITY): Payer: Medicare Other

## 2022-02-04 ENCOUNTER — Other Ambulatory Visit: Payer: Self-pay

## 2022-02-04 ENCOUNTER — Inpatient Hospital Stay: Payer: Medicare Other

## 2022-02-04 ENCOUNTER — Observation Stay (HOSPITAL_COMMUNITY)
Admission: EM | Admit: 2022-02-04 | Discharge: 2022-02-05 | Disposition: A | Discharge status: Skilled Nursing Facility | Payer: Medicare Other | Attending: Family Medicine | Admitting: Family Medicine

## 2022-02-04 VITALS — BP 128/43 | HR 78 | Temp 98.1°F | Resp 18

## 2022-02-04 DIAGNOSIS — D5912 Cold autoimmune hemolytic anemia: Secondary | ICD-10-CM

## 2022-02-04 DIAGNOSIS — R799 Abnormal finding of blood chemistry, unspecified: Secondary | ICD-10-CM | POA: Diagnosis present

## 2022-02-04 DIAGNOSIS — M25559 Pain in unspecified hip: Secondary | ICD-10-CM

## 2022-02-04 DIAGNOSIS — D649 Anemia, unspecified: Secondary | ICD-10-CM

## 2022-02-04 DIAGNOSIS — J449 Chronic obstructive pulmonary disease, unspecified: Secondary | ICD-10-CM | POA: Diagnosis not present

## 2022-02-04 DIAGNOSIS — W19XXXA Unspecified fall, initial encounter: Secondary | ICD-10-CM

## 2022-02-04 DIAGNOSIS — F0390 Unspecified dementia without behavioral disturbance: Secondary | ICD-10-CM | POA: Diagnosis not present

## 2022-02-04 DIAGNOSIS — Z79899 Other long term (current) drug therapy: Secondary | ICD-10-CM | POA: Insufficient documentation

## 2022-02-04 DIAGNOSIS — D591 Autoimmune hemolytic anemia, unspecified: Secondary | ICD-10-CM | POA: Diagnosis present

## 2022-02-04 DIAGNOSIS — Z87891 Personal history of nicotine dependence: Secondary | ICD-10-CM | POA: Insufficient documentation

## 2022-02-04 DIAGNOSIS — J9611 Chronic respiratory failure with hypoxia: Secondary | ICD-10-CM | POA: Diagnosis not present

## 2022-02-04 DIAGNOSIS — Y92009 Unspecified place in unspecified non-institutional (private) residence as the place of occurrence of the external cause: Secondary | ICD-10-CM

## 2022-02-04 DIAGNOSIS — E43 Unspecified severe protein-calorie malnutrition: Secondary | ICD-10-CM | POA: Diagnosis not present

## 2022-02-04 DIAGNOSIS — G4700 Insomnia, unspecified: Secondary | ICD-10-CM

## 2022-02-04 LAB — CMP (CANCER CENTER ONLY)
ALT: 14 U/L (ref 0–44)
AST: 51 U/L — ABNORMAL HIGH (ref 15–41)
Albumin: 3.8 g/dL (ref 3.5–5.0)
Alkaline Phosphatase: 92 U/L (ref 38–126)
Anion gap: 9 (ref 5–15)
BUN: 12 mg/dL (ref 8–23)
CO2: 31 mmol/L (ref 22–32)
Calcium: 8.5 mg/dL — ABNORMAL LOW (ref 8.9–10.3)
Chloride: 99 mmol/L (ref 98–111)
Creatinine: 0.43 mg/dL — ABNORMAL LOW (ref 0.44–1.00)
GFR, Estimated: >60 mL/min (ref >60)
Glucose, Bld: 104 mg/dL — ABNORMAL HIGH (ref 70–99)
Potassium: 4.3 mmol/L (ref 3.5–5.1)
Sodium: 139 mmol/L (ref 135–145)
Total Bilirubin: 3.8 mg/dL (ref 0.3–1.2)
Total Protein: 6.3 g/dL — ABNORMAL LOW (ref 6.5–8.1)

## 2022-02-04 LAB — CBC WITH DIFFERENTIAL (CANCER CENTER ONLY)
Abs Immature Granulocytes: 0 10*3/uL (ref 0.00–0.07)
Basophils Absolute: 0 10*3/uL (ref 0.0–0.1)
Basophils Relative: 0 %
Eosinophils Absolute: 0 10*3/uL (ref 0.0–0.5)
Eosinophils Relative: 0 %
HCT: 13.1 % — ABNORMAL LOW (ref 36.0–46.0)
Hemoglobin: 4.7 g/dL — CL (ref 12.0–15.0)
Lymphocytes Relative: 63 %
Lymphs Abs: 13.9 10*3/uL — ABNORMAL HIGH (ref 0.7–4.0)
MCH: 34.3 pg — ABNORMAL HIGH (ref 26.0–34.0)
MCHC: 35.9 g/dL (ref 30.0–36.0)
MCV: 95.6 fL (ref 80.0–100.0)
Monocytes Absolute: 0.7 10*3/uL (ref 0.1–1.0)
Monocytes Relative: 3 %
Neutro Abs: 7.5 10*3/uL (ref 1.7–7.7)
Neutrophils Relative %: 34 %
Platelet Count: 682 10*3/uL — ABNORMAL HIGH (ref 150–400)
RBC: 1.37 MIL/uL — ABNORMAL LOW (ref 3.87–5.11)
RDW: 15.5 % (ref 11.5–15.5)
WBC Count: 22 10*3/uL — ABNORMAL HIGH (ref 4.0–10.5)
nRBC: 0.1 % (ref 0.0–0.2)

## 2022-02-04 LAB — PREPARE RBC (CROSSMATCH)

## 2022-02-04 MED ORDER — ACETAMINOPHEN 650 MG RE SUPP: 650.0000 mg | Freq: Four times a day (QID) | RECTAL | Status: DC | PRN

## 2022-02-04 MED ORDER — ACETAMINOPHEN 325 MG PO TABS
650.0000 mg | ORAL_TABLET | Freq: Four times a day (QID) | ORAL | Status: DC | PRN
  Administered 2022-02-04 – 2022-02-05 (×2): 650 mg via ORAL
  Filled 2022-02-04 (×2): qty 2

## 2022-02-04 MED ORDER — SERTRALINE HCL 25 MG PO TABS
25.0000 mg | ORAL_TABLET | Freq: Every day | ORAL | Status: DC
  Administered 2022-02-04 – 2022-02-05 (×2): 25 mg via ORAL
  Filled 2022-02-04 (×2): qty 1

## 2022-02-04 MED ORDER — RISPERIDONE 0.5 MG PO TABS
0.5000 mg | ORAL_TABLET | Freq: Once | ORAL | Status: AC
  Administered 2022-02-04: 0.5 mg via ORAL
  Filled 2022-02-04: qty 1

## 2022-02-04 MED ORDER — ONDANSETRON HCL 4 MG/2ML IJ SOLN: 4.0000 mg | Freq: Four times a day (QID) | INTRAMUSCULAR | Status: DC | PRN

## 2022-02-04 MED ORDER — ENSURE ENLIVE PO LIQD
237.0000 mL | Freq: Two times a day (BID) | ORAL | Status: DC
  Administered 2022-02-05: 237 mL via ORAL

## 2022-02-04 MED ORDER — RISPERIDONE 0.25 MG PO TABS
0.5000 mg | ORAL_TABLET | Freq: Two times a day (BID) | ORAL | Status: DC
Start: 2022-02-04 — End: 2022-02-05
  Administered 2022-02-04 – 2022-02-05 (×2): 0.5 mg via ORAL
  Filled 2022-02-04 (×2): qty 2

## 2022-02-04 MED ORDER — QUETIAPINE FUMARATE 25 MG PO TABS
25.0000 mg | ORAL_TABLET | Freq: Every day | ORAL | Status: DC
  Administered 2022-02-04: 25 mg via ORAL
  Filled 2022-02-04: qty 1

## 2022-02-04 MED ORDER — SODIUM CHLORIDE 0.9 % IV SOLN
10.0000 mL/h | Freq: Once | INTRAVENOUS | Status: AC
  Administered 2022-02-04: 10 mL/h via INTRAVENOUS

## 2022-02-04 MED ORDER — ONDANSETRON HCL 4 MG PO TABS
4.0000 mg | ORAL_TABLET | Freq: Four times a day (QID) | ORAL | Status: DC | PRN
  Administered 2022-02-04: 4 mg via ORAL
  Filled 2022-02-04: qty 1

## 2022-02-04 NOTE — Telephone Encounter (Signed)
CRITICAL VALUE STICKER ? ?CRITICAL VALUE:HGB 4.7  ? ?RECEIVER (on-site recipient of call): Georgina Pillion, RN   ? ?DATE & TIME NOTIFIED: 02/04/22; 1586 ? ?MESSENGER (representative from lab):Rosann Auerbach ? ?MD NOTIFIED: Dr. Irene Limbo ? ?TIME OF NOTIFICATION:0950 ? ?RESPONSE: Information acknowledged ? ?

## 2022-02-04 NOTE — Progress Notes (Signed)
.. HEMATOLOGY ONCOLOGY PROGRESS NOTE  Date of service: 02/04/2022  Patient Care Team: [name], MD as PCP - General (Internal Medicine) Brunetta Genera, MD as Consulting Physician (Hematology)   Chief Complaint: Follow-up for cold agglutinin related hemolytic anemia.  INTERVAL HISTORY:  [name] is a 81 y.o. female is here for continued evaluation and management of cold agglutinin related autoimmune hemolytic anemia.  She is accompanied by both her sisters. She reports She reports that she is not feeling well today.  She reports that she is eating well. Her sister notes that Elaine. Cubbage is not eating well.  She notes that she feels cold and specifies her right hand.  Her sister reports that Elaine. Addis has been experiencing some severe left hip pain following a recent fall.   She Is continuing to have significant issues with dementia and with limited mobility and limited p.o. intake. She Continues to live at her nursing home.   She Continues to have conservative goals of care.  We discussed potential treatments and hospice moving forward. We further discussed sending her to ED which she was agreeable to.  We discussed getting updated vaccinations which she was agreeable to.  No reported bleeding or abnormal bruising. No obvious infection issues. No other new or acute focal symptoms.  Labs done today reviewed with the patient and her accompanying 2 sisters. CBC shows elevated WBC count of 22 K/uL and ALC of 13.9, RBC count of 1.37, Hgb of 4.7, thrombocytosis with platelets of 682 K/uL. CMP stable. Bilirubin elevated at 3.8.  REVIEW OF SYSTEMS:   10 Point review of Systems was done is negative except as noted above.   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 Past Surgical History:  Procedure Laterality Date   ABDOMINAL HYSTERECTOMY  1986   APPENDECTOMY  1967   BREAST EXCISIONAL BIOPSY Right 1984   CATARACT EXTRACTION W/ INTRAOCULAR LENS IMPLANT Right 1990's   congential cataract   CYSTOSCOPY W/ URETERAL STENT PLACEMENT  11/12/2015   Procedure: CYSTOSCOPY WITH RETROGRADE PYELOGRAM/URETERAL STENT PLACEMENT;  Surgeon: [name], MD;  Location: [facility];  Service: Urology;;   CYSTOSCOPY W/ URETERAL STENT REMOVAL Left 11/22/2015   Procedure: CYSTOSCOPY WITH STENT REMOVAL;  Surgeon: [name], MD;  Location: [facility];  Service: Urology;  Laterality: Left;   CYSTOSCOPY/URETEROSCOPY/HOLMIUM LASER Left 11/22/2015   Procedure: CYSTOSCOPY/URETEROSCOPY/HOLMIUM LASER;  Surgeon: [name], MD;  Location: [facility];  Service: Urology;  Laterality: Left;  FLEX URETEROSCOPY   FEMUR IM NAIL Left 08/04/2015   Procedure: INTRAMEDULLARY (IM) NAIL FEMORAL;  Surgeon: [name], MD;  Location: [place];  Service: Orthopedics;  Laterality: Left;   INTRAMEDULLARY (IM) NAIL INTERTROCHANTERIC Right 10/04/2021   Procedure: INTRAMEDULLARY (IM) NAIL INTERTROCHANTRIC;  Surgeon: [name], MD;  Location: [place];  Service: Orthopedics;  Laterality: Right;   IR KYPHO THORACIC WITH BONE BIOPSY  06/22/2018   TONSILLECTOMY  age 23    . Social History   Tobacco Use   Smoking status: Former    Packs/day: 1.00    Types: Cigarettes    Quit date: 08/03/2015    Years since quitting: 6.5   Smokeless tobacco: Never  Vaping Use   Vaping Use: Never used  Substance Use Topics   Alcohol use: Yes    Comment: occ   Drug use: Never    ALLERGIES:  is allergic  to other and shellfish allergy.  MEDICATIONS:  Current Outpatient Medications  Medication Sig Dispense Refill   acetaminophen (TYLENOL) 500 MG tablet Take 500 mg by mouth every 6 (six) hours as needed for moderate pain.     docusate sodium (COLACE) 100 MG capsule Take 1 capsule (100 mg total) by mouth 2 (two) times daily. 10 capsule 0   folic acid (FOLVITE) 1 MG tablet Take 2 tablets (2 mg total) by mouth daily.     loratadine (CLARITIN) 10 MG tablet Take 10 mg by mouth daily as needed for allergies.     Multiple Vitamin (MULTIVITAMIN WITH MINERALS) TABS tablet Take 1 tablet by mouth daily.     OXYGEN Inhale 2 L into the lungs See admin instructions. To maintain SAT greater than 90%     pantoprazole (PROTONIX) 40 MG tablet Take 1 tablet (40 mg total) by mouth daily at 6 (six) AM.     polyethylene glycol (MIRALAX / GLYCOLAX) 17 g packet Take 17 g by mouth daily.     PRESCRIPTION MEDICATION Take 90 mLs by mouth daily. Med Pass 2.0     PRESCRIPTION MEDICATION Apply 1 application. topically See admin instructions. Medication: Lorazepam powder. Apply 1 mg gel topically to skin every 12 hours as needed for agitation     QUEtiapine (SEROQUEL) 25 MG tablet Take 1 tablet (25 mg total) by mouth at bedtime. 20 tablet 0   risperiDONE (RISPERDAL) 0.5 MG tablet Take 0.5 mg by mouth 2 (two) times daily.     sertraline (ZOLOFT) 25 MG tablet Take 25 mg by mouth daily.     vitamin B-12 2000 MCG tablet Take 1 tablet (2,000 mcg total) by mouth daily. 30 tablet 2   No current facility-administered medications for this visit.    PHYSICAL EXAMINATION: ECOG PERFORMANCE STATUS: 4 .BP (!) 128/43   Pulse 78   Temp 98.1 F (36.7 C)   Resp 18   SpO2 100%  NAD GENERAL:alert, in no acute distress and comfortable SKIN: no acute rashes, no significant lesions EYES: conjunctival pallor noted, sclera icteric+ LYMPH:  no palpable lymphadenopathy in the cervical, axillary or inguinal regions LUNGS: clear to  auscultation b/l with normal respiratory effort HEART: regular rate & rhythm ABDOMEN:  normoactive bowel sounds , non tender, not distended. Extremity: no pedal edema   LABORATORY DATA:  Labs results reviewed         .    Latest Ref Rng & Units 01/30/2022    8:55 AM 01/29/2022    6:25 PM 01/29/2022    3:43 PM  CBC  WBC 4.0 - 10.5 K/uL 26.0   26.1   25.2    Hemoglobin 12.0 - 15.0 g/dL 8.7   3.8   3.8    Hematocrit 36.0 - 46.0 % 23.6   11.4   10.6    Platelets 150 - 400 K/uL 643   624   684         Latest Ref Rng & Units 01/30/2022    8:55 AM  01/29/2022    6:25 PM 01/29/2022    3:44 PM  CMP  Glucose 70 - 99 mg/dL 92   102   111    BUN 8 - 23 mg/dL '10   16   18    '$ Creatinine 0.44 - 1.00 mg/dL 0.33   0.53   0.54    Sodium 135 - 145 mmol/L 139   137   132    Potassium 3.5 - 5.1 mmol/L 3.7   2.8   2.9    Chloride 98 - 111 mmol/L 104   99   95    CO2 22 - 32 mmol/L '27   28   26    '$ Calcium 8.9 - 10.3 mg/dL 7.7   7.5   7.5    Total Protein 6.5 - 8.1 g/dL 6.0   5.4   5.4    Total Bilirubin 0.3 - 1.2 mg/dL 3.6   2.4   2.5    Alkaline Phos 38 - 126 U/L 109   99   101    AST 15 - 41 U/L 40   43   48    ALT 0 - 44 U/L '16   18   18       '$ RADIOGRAPHIC STUDIES: I have personally reviewed the radiological images as listed and agreed with the findings in the report.  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 Her hemoglobin tends to be lower during winter and has been in the 6-8 range. Overall stable hemoglobin levels in the 6-7 range with folic acid replacement and cold avoidance.  #2 IgM lambda MGUS - cannot rule out possibility of lymphoplasmacytic lymphoma though less likely given chronicity of her condition. Flow cytometry- no clonal B cell lymphoma   #3 History of previous B12 deficiency  #4 Low back pain -compression fracture L4 -MRI L spine -Acute/subacute superior endplate fracture at L4 with loss of height of 10-20%. No retropulsed bone.  This is quite likely the cause of the low back pain. This was probably present on the radiographs of 01/20/2018, but not diagnosable because of the spinal curvature and unlevel endplates. Old healed superior endplate fracture at V61  #5 Spiculated Lung nodule in right upper lobe  First seen on 05/28/18 CTA Chest Hasn't changed over 6 month interval with repeat CT Chest in February 2020  08/13/2019 CT Chest ([id number]) revealed "4 mm posterior right upper lobe pulmonary nodule, unchanged. Dedicated follow-up imaging is not required per Fleischner Society guidelines. This recommendation follows the consensus statement Guidelines for Management of Small Pulmonary Nodules Detected on CT Images: From the Fleischner Society 2017; Radiology 2017; 284:228-243. Aortic Atherosclerosis (ICD10-I70.0) and Emphysema (ICD10-J43.9)."  #7 Vitamin D deficiency -continue Vit D  #8 Patient Active Problem List   Diagnosis Date Noted   Pulmonary nodule 11/06/2021   Exudative pleural effusion 11/05/2021   Fecal impaction ([place]) 11/05/2021   Abnormal CT scan 11/05/2021   Pneumonia 10/31/2021   Acute respiratory failure with hypoxia ([place]) 10/30/2021   Cellulitis of right lower extremity 10/30/2021   Left 9th rib fracture 10/05/2021   Dementia ([place]) 10/03/2021   Intraventricular hemorrhage ([place]) 10/03/2021   Hip fracture ([place]) 10/02/2021   CAP (community acquired pneumonia) 08/10/2021   Sepsis due to undetermined organism ([place]) 08/09/2021   Aortic atherosclerosis ([place]) 08/09/2021   COPD (chronic obstructive pulmonary disease) ([facility])    Elevated transaminase level    Symptomatic anemia 05/28/2018   Autoimmune hemolytic anemia ([place]) 02/25/2018  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) 02/04/2018   Occult blood in stools 02/04/2018   Ureteral calculus 11/12/2015   Breast cancer screening, high risk patient 08/29/2015   Acute delirium 08/07/2015   Postoperative anemia due to acute blood loss 08/06/2015    Protein-calorie malnutrition, severe 08/04/2015   Closed intertrochanteric fracture of left femur ([place]) 08/03/2015   Fall 08/03/2015   Closed right hip fracture ([place]) 08/03/2015   Hyperbilirubinemia 08/01/2013   Hypokalemia 07/30/2013   Vitamin B 12 deficiency 08/07/2012   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   PLAN: -Labs done today reviewed with the patient and her accompanying 2 sisters. CBC shows elevated WBC count of 22 K/uL and ALC of 13.9, RBC count of 1.37, Hgb of 4.7, thrombocytosis with platelets of 682 K/uL. CMP stable. Bilirubin elevated at 3.8. -She continues to have delayed recovery from her hip surgery and is primarily bedbound at this time and is unable to ambulate.  Continues to be in a skilled nursing facility. -Severe dementia at this time -Goals of care are conservative management still wants to continue PRBC transfusion as needed. -With minimal activity she is not limited by symptoms of anemia at this time. -All blood test need to be done at 37 C for accurate results.  The lab tubes need to be pretty warm to 37 C for accurate results. -All PRBC transfusions have to use a blood warmer. -Transfuse as needed for hemoglobin less than 7.5 -absolute cold avoidance. -Patient has had recurrent issues with infections and pneumonias and is a challenging  candidate for continued Rituxan or other complement directed therapies.Patients sisters are going to think about complement directed treatments and let us know if they would want [place] to proceed.  FOLLOW UP: Patient sent to ED for symptomatic anemia and for urgent transfusion support.   The total time spent in the appointment was 32 minutes*.  All of the patient's questions were  answered with apparent satisfaction. The patient knows to call the clinic with any problems, questions or concerns.   Sullivan Lone MD Elaine AAHIVMS [facility] Hematology/Oncology Physician [facility]  .*Total Encounter Time as defined by the Centers for Medicare and Medicaid Services includes, in addition to the face-to-face time of a patient visit (documented in the note above) non-face-to-face time: obtaining and reviewing outside history, ordering and reviewing medications, tests or procedures, care coordination (communications with other health care professionals or caregivers) and documentation in the medical record.  I, Melene Muller, am acting as scribe for Dr. Sullivan Lone, MD. .I have reviewed the above documentation for accuracy and completeness, and I agree with the above. Brunetta Genera MD

## 2022-02-04 NOTE — H&P (Signed)
?History and Physical  ? ? ?Patient: [name] [id number] DOB: May 04, 1941 ?DOA: 02/04/2022 ?DOS: the patient was seen and examined on 02/04/2022 ?PCP: [name], MD  ?Patient coming from:  [place] ? ?Chief Complaint:  ?Chief Complaint  ?Patient presents with  ? Abnormal Lab  ? ?HPI: [name] is a 81 y.o. female with medical history significant of dementia, autoimmune hemolytic anemia, cold agglutinin disease. Presenting with anemia. She was seen in her hematology clinic today for follow up on her cold agglutinin disease today. During their workup, they found her to have a hemoglobin of 4.7. She denied any frank bleeding, hematemesis, dark stools, or hematuria. She reports generalized weakness and poor appetite. She denies any fevers. The cancer center recommended that she come to the ED for evaluation. She denies any other aggravating or alleviating factors.  ? ?Review of Systems: As mentioned in the history of present illness. All other systems reviewed and are negative. ?Past Medical History:  ?Diagnosis Date  ? Aortic atherosclerosis ([place]) 08/09/2021  ? B12 deficiency   ? Bilateral carotid artery stenosis   ? right CCA and ICA 1-39%/    left ICA 40-59%  per last duplex  10-19-2015  ? COPD (chronic obstructive pulmonary disease) ([place])   ? Diverticulosis of colon   ? History of diverticulitis of colon   ? 2004  ? 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 montiored by dr Marko Plume  ? Intraventricular hemorrhage ([place]) 10/03/2021  ? Renal and ureteric calculus   ? left  ? ?Past Surgical History:  ?Procedure Laterality Date  ? ABDOMINAL HYSTERECTOMY  1986  ? APPENDECTOMY  1967  ? BREAST EXCISIONAL BIOPSY Right 1984  ? CATARACT EXTRACTION W/ INTRAOCULAR LENS IMPLANT Right 1990's  ? congential cataract  ? CYSTOSCOPY W/ URETERAL STENT PLACEMENT  11/12/2015  ? Procedure: CYSTOSCOPY WITH RETROGRADE PYELOGRAM/URETERAL STENT PLACEMENT;  Surgeon: [name], MD;  [facility];   Service: Urology;;  ? CYSTOSCOPY W/ URETERAL STENT REMOVAL Left 11/22/2015  ? Procedure: CYSTOSCOPY WITH STENT REMOVAL;  Surgeon: [name], MD;  Location: [facility];  Service: Urology;  Laterality: Left;  ? CYSTOSCOPY/URETEROSCOPY/HOLMIUM LASER Left 11/22/2015  ? Procedure: CYSTOSCOPY/URETEROSCOPY/HOLMIUM LASER;  Surgeon: [name], MD;  Location: [facility];  Service: Urology;  Laterality: Left;  FLEX URETEROSCOPY  ? FEMUR IM NAIL Left 08/04/2015  ? Procedure: INTRAMEDULLARY (IM) NAIL FEMORAL;  Surgeon: [name], MD;  Location: [place];  Service: Orthopedics;  Laterality: Left;  ? INTRAMEDULLARY (IM) NAIL INTERTROCHANTERIC Right 10/04/2021  ? Procedure: INTRAMEDULLARY (IM) NAIL INTERTROCHANTRIC;  Surgeon: [name], MD;  Location: [place];  Service: Orthopedics;  Laterality: Right;  ? IR KYPHO THORACIC WITH BONE BIOPSY  06/22/2018  ? TONSILLECTOMY  age 42  ? ?Social History:  reports that she quit smoking about 6 years ago. Her smoking use included cigarettes. She smoked an average of 1 pack per day. She has never used smokeless tobacco. She reports current alcohol use. She reports that she does not use drugs. ? ?Allergies  ?Allergen Reactions  ? Other Other (See Comments)  ?  "Most antibiotics cause yeast infections"  ? Shellfish Allergy Hives  ? ? ?Family History  ?Problem Relation Age of Onset  ? Breast cancer Cousin   ? ? ?Prior to Admission medications   ?Medication Sig Start Date End Date Taking? Authorizing Provider  ?acetaminophen (TYLENOL) 500 MG tablet Take 500 mg by mouth every 6 (six) hours as needed  for moderate pain.    [provider]  ?docusate sodium (COLACE) 100 MG capsule Take 1 capsule (100 mg total) by mouth 2 (two) times daily. 10/09/21   [name], [name], MD  ?folic acid (FOLVITE) 1 MG tablet Take 2 tablets (2 mg total) by mouth daily. 10/10/21   [name], [name], MD  ?loratadine (CLARITIN) 10 MG tablet Take 10 mg by mouth daily as needed for  allergies.    [provider]  ?Multiple Vitamin (MULTIVITAMIN WITH MINERALS) TABS tablet Take 1 tablet by mouth daily. 10/10/21   [name], [name], MD  ?OXYGEN Inhale 2 L into the lungs See admin instructions. To maintain SAT greater than 90%    [provider]  ?pantoprazole (PROTONIX) 40 MG tablet Take 1 tablet (40 mg total) by mouth daily at 6 (six) AM. 10/10/21   [name], Ripudeep K, MD  ?polyethylene glycol (MIRALAX / GLYCOLAX) 17 g packet Take 17 g by mouth daily.    [provider]  ?PRESCRIPTION MEDICATION Take 90 mLs by mouth daily. Med Pass 2.0    [provider]  ?PRESCRIPTION MEDICATION Apply 1 application. topically See admin instructions. Medication: Lorazepam powder. Apply 1 mg gel topically to skin every 12 hours as needed for agitation    [provider]  ?QUEtiapine (SEROQUEL) 25 MG tablet Take 1 tablet (25 mg total) by mouth at bedtime. 10/09/21   [name], [name], MD  ?risperiDONE (RISPERDAL) 0.5 MG tablet Take 0.5 mg by mouth 2 (two) times daily.    [provider]  ?sertraline (ZOLOFT) 25 MG tablet Take 25 mg by mouth daily. 10/23/21   [provider]  ?vitamin B-12 2000 MCG tablet Take 1 tablet (2,000 mcg total) by mouth daily. 08/21/21   [name], MD  ? ? ?Physical Exam: ?Vitals:  ? 02/04/22 1145 02/04/22 1200 02/04/22 1215 02/04/22 1230  ?BP: 128/60 (!) 138/58 (!) 127/52 (!) 122/48  ?Pulse: 79 79 77 77  ?Resp: '18 18  18  '$ ?Temp:      ?TempSrc:      ?SpO2: 100% 100% 100% 100%  ? ?General: 81 y.o. female resting in bed in NAD ?Eyes: PERRL, normal sclera ?ENMT: Nares patent w/o discharge, orophaynx clear, dentition normal, ears w/o discharge/lesions/ulcers, jaundiced ?Neck: Supple, trachea midline ?Cardiovascular: RRR, +S1, S2, no m/g/r, equal pulses throughout ?Respiratory: CTABL, no w/r/r, normal WOB ?GI: BS+, NDNT, no masses noted, no organomegaly noted ?MSK: No e/c/c ?Neuro: A&O x name only, no focal deficits ?Psyc: Appropriate  interaction and affect, calm/cooperative ? ?Data Reviewed: ? ?Na+  139 ?Scr 0.43 ?AST 51 ?ALT 14 ?T bili  3.8 ?WBC 22.0 ?Hgb  4.7 ?Plt  682 ? ?XR left hip ?Status post bilateral cephalomedullary nail placement without evidence of acute periprosthetic fracture or loosening. ? ?Assessment and Plan: ?Symptomatic anemia ?Cold agglutinin autoimmune hemolytic anemia ?    - place in obs, tele ?    - 2 units warmed pRBCs ordered ?    - labs as above ?    - onco following appreciate assistance ?    - trend H&H; transfuse for Hgb < 7.5 ?    - poor candidate for rituxan ?    - defer need for plasmapheresis to onco ? ?Dementia ?    - continue home regimen when confirmed ? ?Left hip pain ?Falls ?    - negative XR ?    - this is improved now ?    - can have PT review while  she is here; she's a SNF resident ? ?Chronic hypoxic respiratory failure on 2L [place] ?    - continue home O2 ? ?Severe protein-calorie malnutrition ?    - dietitian consult ? ?Advance Care Planning:   Code Status: DNR ? ?Consults: Onco (Dr. Irene Limbo) ? ?Family Communication: w/ sister at bedside ? ?Severity of Illness: ?The appropriate patient status for this patient is OBSERVATION. Observation status is judged to be reasonable and necessary in order to provide the required intensity of service to ensure the patient's safety. The patient's presenting symptoms, physical exam findings, and initial radiographic and laboratory data in the context of their medical condition is felt to place them at decreased risk for further clinical deterioration. Furthermore, it is anticipated that the patient will be medically stable for discharge from the hospital within 2 midnights of admission.  ? ?Author: ?Jonnie Finner, DO ?02/04/2022 1:12 PM ? ?For on call review [email or web address].  ?

## 2022-02-04 NOTE — Progress Notes (Signed)
Per Dr. Grier Mitts orders: Irena Reichmann ED CN - spoke with CN, Patty, gave brief overview of patient needs. Transported patient to [facility] ED via W/chair with O2 per [place]. Patient's sisters shown to ED Waiting Room. Patient assisted into bed [facility] Room 4. Report given to Garnet Sierras, RN. Patient alert and oriented to self.

## 2022-02-04 NOTE — ED Provider Notes (Signed)
?[facility] DEPT ?Provider Note ? ? ?CSN: [id number] ?Arrival date & time: 02/04/22  1112 ? ?  ? ?History ? ?Chief Complaint  ?Patient presents with  ? Abnormal Lab  ? ? ?[name] is a 81 y.o. female. ? ?HPI ?Patient presents with nurse accompaniment from our cancer center due to concern for critically abnormal hemoglobin value.  Patient has multiple medical problems and history is obtained by the patient, chart review, nurse and sisters.  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  Patient went today to a cancer center for repeat evaluation after recent admission, there was found have critically abnormal hemoglobin value, sent here for evaluation. ?Patient's dementia is evident on exam, level 5 caveat.  She denies pain, dyspnea, however. ? ?Family also reports there was a recent fall, with left hip pain, though the patient does not currently complain of this. ?  ? ?Home Medications ?Prior to Admission medications   ?Medication Sig Start Date End Date Taking? Authorizing Provider  ?acetaminophen (TYLENOL) 500 MG tablet Take 500 mg by mouth every 6 (six) hours as needed for moderate pain.    [provider]  ?docusate sodium (COLACE) 100 MG capsule Take 1 capsule (100 mg total) by mouth 2 (two) times daily. 10/09/21   [name], [name], MD  ?folic acid (FOLVITE) 1 MG tablet Take 2 tablets (2 mg total) by mouth daily. 10/10/21   [name], [name], MD  ?loratadine (CLARITIN) 10 MG tablet Take 10 mg by mouth daily as needed for allergies.    [provider]  ?Multiple Vitamin (MULTIVITAMIN WITH MINERALS) TABS tablet Take 1 tablet by mouth daily. 10/10/21   [name], [name], MD  ?OXYGEN Inhale 2 L into the lungs See admin instructions. To maintain SAT greater than 90%     [provider]  ?pantoprazole (PROTONIX) 40 MG tablet Take 1 tablet (40 mg total) by mouth daily at 6 (six) AM. 10/10/21   [name], Ripudeep K, MD  ?polyethylene glycol (MIRALAX / GLYCOLAX) 17 g packet Take 17 g by mouth daily.    [provider]  ?PRESCRIPTION MEDICATION Take 90 mLs by mouth daily. Med Pass 2.0    [provider]  ?PRESCRIPTION MEDICATION Apply 1 application. topically See admin instructions. Medication: Lorazepam powder. Apply 1 mg gel topically to skin every 12 hours as needed for agitation    [provider]  ?QUEtiapine (SEROQUEL) 25 MG tablet Take 1 tablet (25 mg total) by mouth at bedtime. 10/09/21   [name], [name], MD  ?risperiDONE (RISPERDAL) 0.5 MG tablet Take 0.5 mg by mouth 2 (two) times daily.    [provider]  ?sertraline (ZOLOFT) 25 MG tablet Take 25 mg by mouth daily. 10/23/21   [provider]  ?vitamin B-12 2000 MCG tablet Take 1 tablet (2,000 mcg total) by mouth daily. 08/21/21   [name], MD  ?   ? ?Allergies    ?Other and Shellfish allergy   ? ?Review of Systems   ?Review of Systems  ?Unable to perform ROS: Dementia  ? ?Physical Exam ?Updated Vital Signs ?BP (!) 122/48   Pulse 77   Temp 98.3 ?F (36.8 ?C) (Oral)   Resp 18   SpO2 100%  ?Physical Exam ?Vitals and nursing note reviewed.  ?Constitutional:   ?  General: She is not in acute distress. ?   Appearance: She is well-developed.  ?HENT:  ?   Head: Normocephalic and atraumatic.  ?Eyes:  ?   Conjunctiva/sclera: Conjunctivae normal.  ?Cardiovascular:  ?   Rate and Rhythm: Normal rate and regular rhythm.  ?Pulmonary:  ?   Effort: Pulmonary effort is normal. No respiratory distress.  ?   Breath sounds: Normal breath sounds. No stridor.  ?Abdominal:  ?   General: There is no distension.  ?Skin: ?   General: Skin is warm and dry.  ?Neurological:  ?   Mental Status: She is alert. Mental status is at baseline. She is disoriented.  ?   Cranial Nerves: No cranial nerve  deficit, dysarthria or facial asymmetry.  ?   Motor: Atrophy present.  ?Psychiatric:     ?   Mood and Affect: Mood is anxious.     ?   Cognition and Memory: Memory is impaired.  ? ? ?ED Results / Procedures / Treatments   ?Labs ?(all labs ordered are listed, but only abnormal results are displayed) ?Labs Reviewed  ?PREPARE RBC (CROSSMATCH)  ? ? ?EKG ?None ? ?Radiology ?DG Hip Unilat W or Wo Pelvis 2-3 Views Left ? ?Result Date: 02/04/2022 ?CLINICAL DATA:  Fall?, complains of diffuse left hip pain. Prior surgery to both hips. EXAM: DG HIP (WITH OR WITHOUT PELVIS) 2-3V LEFT COMPARISON:  None Available. FINDINGS: Status post bilateral cephalomedullary nail placement. No perihardware loosening or evidence of periprosthetic fracture. Diffuse osteopenia. IMPRESSION: Status post bilateral cephalomedullary nail placement without evidence of acute periprosthetic fracture or loosening. Electronically Signed   By: Keane Police D.O.   On: 02/04/2022 12:34   ? ?Procedures ?Procedures  ? ? ?Medications Ordered in ED ?Medications  ?0.9 %  sodium chloride infusion (has no administration in time range)  ?risperiDONE (RISPERDAL) tablet 0.5 mg (has no administration in time range)  ? ? ?ED Course/ Medical Decision Making/ A&P ?This patient with a Hx of dementia, non-Hodgkin's lymphoma, hemolytic anemia presents to the ED for concern of fatigue, fall, concern for anemia, this involves an extensive number of treatment options, and is a complaint that carries with it a high risk of complications and morbidity.   ? ?The differential diagnosis includes going hemolysis, exsanguination, GI bleed, medication effects ? ? ?Social Determinants of Health: ? ?Dementia, age ? ?Additional history obtained: ? ?Additional history and/or information obtained from chart review, family, notable for labs from the cancer center today with a hemoglobin of 4.7 ? ? ?After the initial evaluation, orders, including: Transfusion initiated, continuous monitoring  were initiated. ? ? ?Patient placed on Cardiac and Pulse-Oximetry Monitors. ?The patient was maintained on a cardiac monitor.  The cardiac monitored showed an rhythm of 75 sinus normal ?The patient was also maintained on pulse oximetry. The readings were typically 99% room air normal ? ? ?On repeat evaluation of the patient  became agitated, requiring provision of her home Risperdal ? ?Lab Tests: ? ?I personally interpreted labs.  The pertinent results include: As above, hemoglobin 4.7, critically abnormal ? ?Imaging Studies ordered: ? ?I independently visualized and interpreted imaging which showed no hip fracture ?I agree with the radiologist interpretation ? ?Consultations Obtained: ? ?I requested consultation with the internal medicine,  and discussed lab and imaging findings as well as pertinent plan - they recommend: Transfusion, admission ? ?Dispostion / Final MDM: ? ?After consideration of the diagnostic results and the patient's response to treatment, this adult female with multiple medical issues  including dementia, non-Hodgkin's lymphoma and hemolytic anemia presents less than 2 weeks after recent admission for somewhat similar concerns, now with concern for fatigue, fall, found to have critically abnormal hemoglobin value requiring initiation of transfusion, admission for monitoring, management.  Oncology is aware of the patient, can follow as a consulting service as well. ? ?Final Clinical Impression(s) / ED Diagnoses ?Final diagnoses:  ?Symptomatic anemia  ? ?CRITICAL CARE ?Performed by: Carmin Muskrat ?Total critical care time: 35 minutes ?Critical care time was exclusive of separately billable procedures and treating other patients. ?Critical care was necessary to treat or prevent imminent or life-threatening deterioration. ?Critical care was time spent personally by me on the following activities: development of treatment plan with patient and/or surrogate as well as nursing, discussions with  consultants, evaluation of patient's response to treatment, examination of patient, obtaining history from patient or surrogate, ordering and performing treatments and interventions, ordering and review of laboratory stud

## 2022-02-04 NOTE — ED Triage Notes (Signed)
Pt from [facility], Non-Hodgkin's Lymph. Sent for Low HGB 4.7. Hx of symptomatic anemia. Pt also has Coldagglutinin requiring warmed blood trans. Blood Bank aware, Pt has been Typed and screened for special antibodies, and we were told to not remove current Blood Band.  ?

## 2022-02-04 NOTE — Progress Notes (Signed)
Patient admitted to [facility] from ED in NAD. VSS. Currently on 4L O2. A/Ox1-2. Patient and family oriented to unit and call bell. Falls precautions in place.  ?

## 2022-02-05 ENCOUNTER — Inpatient Hospital Stay: Payer: Medicare Other

## 2022-02-05 ENCOUNTER — Other Ambulatory Visit: Payer: Self-pay

## 2022-02-05 ENCOUNTER — Telehealth: Payer: Self-pay

## 2022-02-05 ENCOUNTER — Other Ambulatory Visit: Payer: Self-pay | Admitting: Family Medicine

## 2022-02-05 DIAGNOSIS — D591 Autoimmune hemolytic anemia, unspecified: Secondary | ICD-10-CM

## 2022-02-05 DIAGNOSIS — D5912 Cold autoimmune hemolytic anemia: Secondary | ICD-10-CM

## 2022-02-05 DIAGNOSIS — D649 Anemia, unspecified: Secondary | ICD-10-CM | POA: Diagnosis not present

## 2022-02-05 LAB — CBC
HCT: 27.5 % — ABNORMAL LOW (ref 36.0–46.0)
Hemoglobin: 9.6 g/dL — ABNORMAL LOW (ref 12.0–15.0)
MCH: 35.7 pg — ABNORMAL HIGH (ref 26.0–34.0)
MCHC: 34.9 g/dL (ref 30.0–36.0)
MCV: 102.2 fL — ABNORMAL HIGH (ref 80.0–100.0)
Platelets: 483 10*3/uL — ABNORMAL HIGH (ref 150–400)
RBC: 2.69 MIL/uL — ABNORMAL LOW (ref 3.87–5.11)
RDW: 19.5 % — ABNORMAL HIGH (ref 11.5–15.5)
WBC: 23.5 10*3/uL — ABNORMAL HIGH (ref 4.0–10.5)
nRBC: 0.3 % — ABNORMAL HIGH (ref 0.0–0.2)

## 2022-02-05 LAB — COMPREHENSIVE METABOLIC PANEL
ALT: 15 U/L (ref 0–44)
AST: 30 U/L (ref 15–41)
Albumin: 3.2 g/dL — ABNORMAL LOW (ref 3.5–5.0)
Alkaline Phosphatase: 97 U/L (ref 38–126)
Anion gap: 10 (ref 5–15)
BUN: 15 mg/dL (ref 8–23)
CO2: 28 mmol/L (ref 22–32)
Calcium: 8.1 mg/dL — ABNORMAL LOW (ref 8.9–10.3)
Chloride: 100 mmol/L (ref 98–111)
Creatinine, Ser: 0.39 mg/dL — ABNORMAL LOW (ref 0.44–1.00)
GFR, Estimated: >60 mL/min (ref >60)
Glucose, Bld: 154 mg/dL — ABNORMAL HIGH (ref 70–99)
Potassium: 4.6 mmol/L (ref 3.5–5.1)
Sodium: 138 mmol/L (ref 135–145)
Total Bilirubin: 2.9 mg/dL — ABNORMAL HIGH (ref 0.3–1.2)
Total Protein: 5.9 g/dL — ABNORMAL LOW (ref 6.5–8.1)

## 2022-02-05 LAB — TYPE AND SCREEN
ABO/RH(D): B POS
Antibody Screen: NEGATIVE
Unit division: 0
Unit division: 0

## 2022-02-05 LAB — BPAM RBC
Blood Product Expiration Date: 202306132359
Blood Product Expiration Date: 202306132359
ISSUE DATE / TIME: 202305151553
ISSUE DATE / TIME: 202305151943
Unit Type and Rh: 5100
Unit Type and Rh: 5100

## 2022-02-05 MED ORDER — QUETIAPINE FUMARATE 25 MG PO TABS: 25.0000 mg | ORAL_TABLET | Freq: Every day | ORAL | 0 refills | Status: DC

## 2022-02-05 MED ORDER — LORAZEPAM POWD
1.0000 mg | Freq: Two times a day (BID) | 0 refills | Status: AC | PRN
Start: 2022-02-05 — End: ?

## 2022-02-05 MED ORDER — RISPERIDONE 0.5 MG PO TABS: 0.5000 mg | ORAL_TABLET | Freq: Two times a day (BID) | ORAL | 0 refills | Status: AC

## 2022-02-05 NOTE — Telephone Encounter (Signed)
Pt's daughter [name] called regarding a discussion they had with Dr. Irene Limbo about two options, Hospice or trying treatment. [name] states [name] has decided to proceed with trying treatment. Dr. Irene Limbo has been made aware and knows to call [phone] if he needs to follow up with the family or pt. No further questions or concerns. ?

## 2022-02-05 NOTE — Discharge Summary (Signed)
Physician Discharge Summary  ?[name] QHU:[id number] DOB: 11-30-1940 DOA: 02/04/2022 ? ?PCP: [name], MD ? ?Admit date: 02/04/2022 ?Discharge date: 02/05/2022 ? ?Time spent: 45 minutes ? ?Recommendations for Outpatient Follow-up:  ?Patient may return to [facility] for further management and care-Long discussion with family specifically K. Ronnald Ramp the patient's relative-they are interested in further chemo XRT for the underlying disease process-they are however aware of the poor overall prognosis if she returns to the hospital repetitively-I would elicit care from palliative medicine in the outpatient setting ASAP to ensure that they have a safety net in case they decide to convert to hospice after my clear discussions with them ? ?Discharge Diagnoses:  ?MAIN problem for hospitalization  ? 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 Irene Limbo in the outpatient setting ? ?Please see below for itemized issues addressed in HOpsital- ?refer to other progress notes for clarity if needed ? ?Discharge Condition: Guarded high risk for readmission ? ?Diet recommendation: Comfort ? ?Filed Weights  ? 02/04/22 1511  ?Weight: 35.3 kg  ? ? 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 patient has been on Rituxan in the past and has received transfusion multiple times for her cold agglutinin hemolytic anemia-was just recently admitted 5/9 through 01/30/2022-patient is known to palliative care Dr. Domingo Cocking who has had goals of care discussions with the family previously ? ?She is also quite demented-has a BMI of 14 and represented after being encephalopathic more so than her usual delirious state on 5/15 ? ?She was found to have hemoglobin of 4-she was transfused 2 units PRBC ? ?I cannot really get any review of systems ? ?I had a 15-minute conversation  with the patient's daughter Kay-I explained to her my feeling after discussing with Dr. Irene Limbo is that she is having adult failure to thrive-I explained this in very clear terms and very simple terms- ? ?Disposition is complicated in terms of decision making because family has had a recent loss and there are several family members who are not in alignment with full comfort--I have explained to Mrs. [name] that I do think palliative care should come in and look in on the patient in the outpatient setting at [facility] ? ?We will sign the DNR, refill meds and discharge the patient back to [facility] for further care as per their physicians ? ? ?Discharge Exam: ?Vitals:  ? 02/04/22 2256 02/05/22 0444  ?BP: (!) 152/88 (!) 122/57  ?Pulse: 82 83  ?Resp: 18 18  ?Temp: 98 ?F (36.7 ?C) 98.6 ?F (37 ?C)  ?SpO2: 100% 94%  ? ? ?Subj on day of d/c ?  ?Confused but pleasant ? ?General Exam on discharge ? ?EOMI NCAT no focal deficit moving 4 limbs ?Chest clear no rales rhonchi ?S1-S2 no murmur no rub no gallop ?Abdomen soft ?She is slightly contracted at her knees ?She is very confused ? ?Discharge Instructions ? ? ?Discharge Instructions   ? ? Diet - low sodium heart healthy   Complete by: As directed ?  ? Increase activity slowly   Complete by: As directed ?  ? ?  ? ?Allergies as of 02/05/2022   ? ?   Reactions  ? Other Other (See Comments)  ? "Most antibiotics cause yeast infections"  ? Shellfish Allergy Hives  ? ?  ? ?  ?Medication List  ?  ? ?STOP taking  these medications   ? ?loratadine 10 MG tablet ?Commonly known as: CLARITIN ?  ?multivitamin with minerals Tabs tablet ?  ?PRESCRIPTION MEDICATION ?  ? ?  ? ?TAKE these medications   ? ?acetaminophen 500 MG tablet ?Commonly known as: TYLENOL ?Take 500 mg by mouth every 6 (six) hours as needed for moderate pain. ?  ?cyanocobalamin 2000 MCG tablet ?Take 1 tablet (2,000 mcg total) by mouth daily. ?  ?docusate sodium 100 MG capsule ?Commonly known as: COLACE ?Take 1 capsule  (100 mg total) by mouth 2 (two) times daily. ?  ?folic acid 1 MG tablet ?Commonly known as: FOLVITE ?Take 2 tablets (2 mg total) by mouth daily. ?  ?Lorazepam Powd ?1 mg by Does not apply route every 12 (twelve) hours as needed (agitation). ?  ?OXYGEN ?Inhale 2 L into the lungs See admin instructions. To maintain SAT greater than 90% ?  ?pantoprazole 40 MG tablet ?Commonly known as: PROTONIX ?Take 1 tablet (40 mg total) by mouth daily at 6 (six) AM. ?  ?polyethylene glycol 17 g packet ?Commonly known as: MIRALAX / GLYCOLAX ?Take 17 g by mouth daily. ?  ?QUEtiapine 25 MG tablet ?Commonly known as: SEROquel ?Take 1 tablet (25 mg total) by mouth at bedtime. ?  ?risperiDONE 0.5 MG tablet ?Commonly known as: RISPERDAL ?Take 1 tablet (0.5 mg total) by mouth 2 (two) times daily. ?  ?sertraline 25 MG tablet ?Commonly known as: ZOLOFT ?Take 25 mg by mouth daily. ?  ? ?  ? ?Allergies  ?Allergen Reactions  ? Other Other (See Comments)  ?  "Most antibiotics cause yeast infections"  ? Shellfish Allergy Hives  ? ? ? ? ?The results of significant diagnostics from this hospitalization (including imaging, microbiology, ancillary and laboratory) are listed below for reference.   ? ?Significant Diagnostic Studies: ?DG Hip Unilat W or Wo Pelvis 2-3 Views Left ? ?Result Date: 02/04/2022 ?CLINICAL DATA:  Fall?, complains of diffuse left hip pain. Prior surgery to both hips. EXAM: DG HIP (WITH OR WITHOUT PELVIS) 2-3V LEFT COMPARISON:  None Available. FINDINGS: Status post bilateral cephalomedullary nail placement. No perihardware loosening or evidence of periprosthetic fracture. Diffuse osteopenia. IMPRESSION: Status post bilateral cephalomedullary nail placement without evidence of acute periprosthetic fracture or loosening. Electronically Signed   By: Keane Police D.O.   On: 02/04/2022 12:34   ? ?Microbiology: ?No results found for this or any previous visit (from the past 240 hour(s)).  ? ?Labs: ?Basic Metabolic Panel: ?Recent Labs   ?Lab 01/29/22 ?1544 01/29/22 ?1825 01/29/22 ?2200 01/30/22 ?0855 02/04/22 ?1517 02/05/22 ?6160  ?NA 132* 137  --  139 139 138  ?K 2.9* 2.8*  --  3.7 4.3 4.6  ?CL 95* 99  --  104 99 100  ?CO2 26 28  --  '27 31 28  '$ ?GLUCOSE 111* 102*  --  92 104* 154*  ?BUN 18 16  --  '10 12 15  '$ ?CREATININE 0.54 0.53  --  0.33* 0.43* 0.39*  ?CALCIUM 7.5* 7.5*  --  7.7* 8.5* 8.1*  ?MG  --   --  1.9  --   --   --   ? ?Liver Function Tests: ?Recent Labs  ?Lab 01/29/22 ?1544 01/29/22 ?1825 01/30/22 ?7371 02/04/22 ?0626 02/05/22 ?9485  ?AST 48* 43* 40 51* 30  ?ALT '18 18 16 14 15  '$ ?ALKPHOS 101 99 109 92 97  ?BILITOT 2.5* 2.4* 3.6* 3.8* 2.9*  ?PROT 5.4* 5.4* 6.0* 6.3* 5.9*  ?ALBUMIN 3.1* 3.1* 3.3* 3.8 3.2*  ? ?  No results for input(s): LIPASE, AMYLASE in the last 168 hours. ?No results for input(s): AMMONIA in the last 168 hours. ?CBC: ?Recent Labs  ?Lab 01/29/22 ?1543 01/29/22 ?1544 01/29/22 ?1825 01/30/22 ?5456 02/04/22 ?2563 02/05/22 ?8937  ?WBC 25.2*  --  26.1* 26.0* 22.0* 23.5*  ?NEUTROABS  --  17.1* 21.7*  --  7.5  --   ?HGB 3.8*  --  3.8* 8.7* 4.7* 9.6*  ?HCT 10.6*  --  11.4* 23.6* 13.1* 27.5*  ?MCV 101.0*  --  100.9* 102.2* 95.6 102.2*  ?PLT 684*  --  624* 643* 682* 483*  ? ?Cardiac Enzymes: ?No results for input(s): CKTOTAL, CKMB, CKMBINDEX, TROPONINI in the last 168 hours. ?BNP: ?BNP (last 3 results) ?No results for input(s): BNP in the last 8760 hours. ? ?ProBNP (last 3 results) ?No results for input(s): PROBNP in the last 8760 hours. ? ?CBG: ?No results for input(s): GLUCAP in the last 168 hours. ? ? ? ? ?Signed: ? ?Nita Sells MD   ?[facility] ?02/05/2022, 9:54 AM ? ? ?

## 2022-02-05 NOTE — TOC Initial Note (Signed)
Transition of Care (TOC) - Initial/Assessment Note  ? ? ?Patient Details  ?Name: [name] ?MRN: [id number] ?Date of Birth: 1941-04-01 ? ?Transition of Care (TOC) CM/SW Contact:    ?Osiel Stick, Marjie Skiff, RN ?Phone Number: ?02/05/2022, 10:50 AM ? ?Clinical Narrative:                 ?Pt was admitted from [facility] yesterday and will dc back there today. No FL2 needed as pt has been in hospital less than 24hrs and is in observation status. DC summary sent via hub. [facility] called for transport. Pt will go to room 209 and RN to call report to [phone]. Yellow DNR on the chart for transport. ? ?Expected Discharge Plan: [place] ?Barriers to Discharge: No Barriers Identified ? ?Expected Discharge Plan and Services ?Expected Discharge Plan: [place] ?  ?Discharge Planning Services: CM Consult ?  ?Living arrangements for the past 2 months: [place] ?Expected Discharge Date: 02/05/22               ?  ?  ?Prior Living Arrangements/Services ?Living arrangements for the past 2 months: [place] ?Lives with:: Facility Resident ?Patient language and need for interpreter reviewed:: Yes ?       ?Need for Family Participation in Patient Care: Yes (Comment) ?Care giver support system in place?: Yes (comment) ?  ?Criminal Activity/Legal Involvement Pertinent to Current Situation/Hospitalization: No - Comment as needed ? ?Activities of Daily Living ?Home Assistive Devices/Equipment: Wheelchair, Shower chair with back ?ADL Screening (condition at time of admission) ?Patient's cognitive ability adequate to safely complete daily activities?: No ?Is the patient deaf or have difficulty hearing?: No ?Does the patient have difficulty seeing, even when wearing glasses/contacts?: No ?Does the patient have difficulty concentrating, remembering, or making decisions?: Yes ?Patient able to express need for assistance with ADLs?: Yes ?Does the patient have difficulty dressing or  bathing?: Yes ?Independently performs ADLs?: No ?Communication: Independent ?Dressing (OT): Needs assistance ?Is this a change from baseline?: Pre-admission baseline ?Grooming: Independent ?Feeding: Independent ?Bathing: Needs assistance ?Is this a change from baseline?: Pre-admission baseline ?Toileting: Needs assistance ?Is this a change from baseline?: Pre-admission baseline ?In/Out Bed: Needs assistance ?Is this a change from baseline?: Pre-admission baseline ?Walks in Home: Needs assistance ?Is this a change from baseline?: Pre-admission baseline ?Does the patient have difficulty walking or climbing stairs?: Yes ?Weakness of Legs: Both ?Weakness of Arms/Hands: Both ? ?Permission Sought/Granted ?  ?  ?   ?   ?   ?   ? ?Emotional Assessment ?  ?  ?  ?  ?Alcohol / Substance Use: Not Applicable ?  ? ?Admission diagnosis:  Symptomatic anemia [D64.9] ?Patient Active Problem List  ? Diagnosis Date Noted  ? Chronic respiratory failure with hypoxia ([place]) 02/04/2022  ? Hip pain 02/04/2022  ? Pulmonary nodule 11/06/2021  ? Exudative pleural effusion 11/05/2021  ? Fecal impaction ([place]) 11/05/2021  ? Abnormal CT scan 11/05/2021  ? Pneumonia 10/31/2021  ? Acute respiratory failure with hypoxia ([place]) 10/30/2021  ? Cellulitis of right lower extremity 10/30/2021  ? Left 9th rib fracture 10/05/2021  ? Dementia ([place]) 10/03/2021  ? Intraventricular hemorrhage ([place]) 10/03/2021  ? Hip fracture ([place]) 10/02/2021  ? CAP (community acquired pneumonia) 08/10/2021  ? Sepsis due to undetermined organism ([place]) 08/09/2021  ? Aortic atherosclerosis ([place]) 08/09/2021  ? COPD (chronic obstructive pulmonary disease) ([place])   ? Elevated transaminase level   ? Symptomatic anemia 05/28/2018  ? 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 Occult blood in stools 02/04/2018  ? Ureteral calculus 11/12/2015  ? Breast cancer screening, high risk patient 08/29/2015  ? Acute delirium 08/07/2015  ?  Postoperative anemia due to acute blood loss 08/06/2015  ? Protein-calorie malnutrition, severe 08/04/2015  ? Closed intertrochanteric fracture of left femur ([place]) 08/03/2015  ? Fall at home, initial encounter 08/03/2015  ? Closed right hip fracture ([place]) 08/03/2015  ? Hyperbilirubinemia 08/01/2013  ? Hypokalemia 07/30/2013  ? Vitamin B 12 deficiency 08/07/2012  ? 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 Tobacco abuse 06/10/2012  ? ?PCP:  [name], MD ?Pharmacy:   ?[facility] ?[place] ?[address] [place] [address] ?Phone: [phone] Fax: [phone] ? ? ? ? ?Social Determinants of Health (SDOH) Interventions ?  ? ?Readmission Risk Interventions ? ?  08/20/2021  ?  2:26 PM  ?Readmission Risk Prevention Plan  ?Transportation Screening Complete  ?PCP or Specialist Appt within 5-7 Days Complete  ?Home Care Screening Complete  ?Medication Review (RN CM) Complete  ? ? ? ?

## 2022-02-05 NOTE — Plan of Care (Signed)

## 2022-02-05 NOTE — Evaluation (Signed)
Physical Therapy Evaluation ?Patient Details ?Name: [name] ?MRN: [id number] ?DOB: [birth year] ?Today's Date: 02/05/2022 ? ?History of Present Illness ? Patient is 81 y.o. female admitted for symptomatic anemia. Patient went today to a cancer center for repeat evaluation after recent admission, there was found have critically abnormal hemoglobin value, sent here for evaluation for critically low hemoglobin. 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 PMH significant for Lt IM nail in 2016, Rt IM nail in 10/04/2021, kyphoplasty in 2019. ? ?  ?Clinical Impression ? [name] is 81 y.o. female admitted with above HPI and diagnosis. Patient is currently limited by functional impairments below (see PT problem list). Patient is a resident at [facility] SN[facility] currently and was unable to provide baseline/PLOF due to memory/cognitive impairments. Patient will benefit from continued skilled PT interventions to address impairments and progress independence with mobility, recommending return to SNF with rehab to progress mobility. Acute PT will follow and progress as able.    ?   ? ?Recommendations for follow up therapy are one component of a multi-disciplinary discharge planning process, led by the attending physician.  Recommendations may be updated based on patient status, additional functional criteria and insurance authorization. ? ?Follow Up Recommendations Skilled nursing-short term rehab (<3 hours/day) ? ?  ?Assistance Recommended at Discharge Frequent or constant Supervision/Assistance  ?Patient can return home with the following ?   ? ?  ?Equipment Recommendations None recommended by PT  ?Recommendations for Other Services ?    ?  ?Functional Status Assessment Patient has had a recent decline in  their functional status and demonstrates the ability to make significant improvements in function in a reasonable and predictable amount of time.  ? ?  ?Precautions / Restrictions Precautions ?Precautions: Fall ?Restrictions ?Weight Bearing Restrictions: No  ? ?  ? ?Mobility ? Bed Mobility ?Overal bed mobility: Needs Assistance ?Bed Mobility: Supine to Sit ?  ?  ?Supine to sit: Min assist ?  ?  ?General bed mobility comments: verbal cues to sequence reaching for bed rail and to bring LE's off EOB. Assist to fully raise trunk upright. ?  ? ?Transfers ?Overall transfer level: Needs assistance ?Equipment used: Rolling walker (2 wheels) ?Transfers: Sit to/from Stand, Bed to chair/wheelchair/BSC ?Sit to Stand: Min assist ?  ?Step pivot transfers: Min assist ?  ?  ?  ?General transfer comment: pt keeping bil UE on RW for power up from EOB and assist to steady and prevent posterior lean/LOBin standing. Min assist to mange walker position to turn to [facility]. ?  ? ?Ambulation/Gait ?Ambulation/Gait assistance: Min assist ?Gait Distance (Feet): 7 Feet ?Assistive device: Rolling walker (2 wheels) ?Gait Pattern/deviations: Decreased step length - right, Decreased step length - left, Decreased stride length, Step-through pattern, Shuffle, Decreased dorsiflexion - left, Decreased dorsiflexion - right, Trunk flexed, Leaning posteriorly ?Gait velocity: decr ?  ?  ?General Gait Details: verbal cues to direct gait and tactile cues/manual facilitation of anterior lean to prevent posterior LOB during forward steps and with turn to sit in recliner. ? ?Stairs ?  ?  ?  ?  ?  ? ?Wheelchair Mobility ?  ? ?Modified Rankin (Stroke Patients Only) ?  ? ?  ? ?Balance   ?  ?  ?  ?  ?  ?  ?  ?  ?  ?  ?  ?  ?  ?  ?  ?  ?  ?  ?   ? ? ? ?  Pertinent Vitals/Pain    ? ? ?Home Living Family/patient expects to be discharged to:: Skilled nursing facility ?Living Arrangements: Alone ?  ?  ?  ?  ?  ?  ?  ?  ?   ?  ?Prior Function Prior Level of Function : Patient  poor historian/Family not available ?  ?  ?  ?  ?  ?  ?Mobility Comments: pt reports she thinks she has been learnign to use a RW and does not get out of her room much. ?  ?  ? ? ?Hand Dominance  ? Dominant Hand: Right ? ?  ?Extremity/Trunk Assessment  ? Upper Extremity Assessment ?Upper Extremity Assessment: Overall WFL for tasks assessed ?  ? ?Lower Extremity Assessment ?Lower Extremity Assessment: Overall WFL for tasks assessed ?  ? ?Cervical / Trunk Assessment ?Cervical / Trunk Assessment: Normal  ?Communication  ? Communication: Expressive difficulties  ?Cognition Arousal/Alertness: Awake/alert ?Behavior During Therapy: [facility] for tasks assessed/performed ?Overall Cognitive Status: Within Functional Limits for tasks assessed ?  ?  ?  ?  ?  ?  ?  ?  ?  ?  ?  ?  ?  ?  ?  ?  ?  ?  ?  ? ?  ?General Comments   ? ?  ?Exercises    ? ?Assessment/Plan  ?  ?PT Assessment Patient needs continued PT services  ?PT Problem List Decreased strength;Decreased activity tolerance;Decreased mobility;Decreased balance;Decreased cognition;Decreased knowledge of use of DME;Decreased safety awareness;Decreased knowledge of precautions ? ?   ?  ?PT Treatment Interventions DME instruction;Gait training;Stair training;Functional mobility training;Therapeutic activities;Therapeutic exercise;Patient/family education   ? ?PT Goals (Current goals can be found in the Care Plan section)  ?Acute Rehab PT Goals ?Patient Stated Goal: none stated ?PT Goal Formulation: Patient unable to participate in goal setting ?Time For Goal Achievement: 02/12/22 ?Potential to Achieve Goals: Good ? ?  ?Frequency Min 2X/week ?  ? ? ?Co-evaluation   ?  ?  ?  ?  ? ? ?  ?AM-PAC PT "6 Clicks" Mobility  ?Outcome Measure Help needed turning from your back to your side while in a flat bed without using bedrails?: A Little ?Help needed moving from lying on your back to sitting on the side of a flat bed without using bedrails?: A Little ?Help needed moving to and from a bed  to a chair (including a wheelchair)?: A Little ?Help needed standing up from a chair using your arms (e.g., wheelchair or bedside chair)?: A Little ?Help needed to walk in hospital room?: A Little ?Help needed climbing 3-5 steps with a railing? : Total ?6 Click Score: 16 ? ?  ?End of Session Equipment Utilized During Treatment: Gait belt ?Activity Tolerance: Patient tolerated treatment well ?Patient left: in chair;with call bell/phone within reach;with chair alarm set ?Nurse Communication: Mobility status ?PT Visit Diagnosis: Unsteadiness on feet (R26.81);Muscle weakness (generalized) (M62.81);Difficulty in walking, not elsewhere classified (R26.2) ?  ? ?Time: 1572-6203 ?PT Time Calculation (min) (ACUTE ONLY): 22 min ? ? ?Charges:   PT Evaluation ?$PT Eval Low Complexity: 1 Low ?  ?  ?   ? ? ?Gwynneth Albright PT, DPT ?Acute Rehabilitation Services ?Office [phone] ?Pager [phone] ? ?02/05/22 12:22 PM  ? ?[name] ?02/05/2022, 12:22 PM ? ?

## 2022-02-05 NOTE — Plan of Care (Signed)
Patient being discharged to SNF.  Discharge paperwork sent to facility. Patient transported to facility via [place]. PIV and tele removed per orders.  ?Report called to [facility] at 1100. ? ?Problem: Education: ?Goal: Knowledge of General Education information will improve ?Description: Including pain rating scale, medication(s)/side effects and non-pharmacologic comfort measures ?Outcome: Completed/Met ?  ?Problem: Health Behavior/Discharge Planning: ?Goal: Ability to manage health-related needs will improve ?Outcome: Completed/Met ?  ?Problem: Clinical Measurements: ?Goal: Ability to maintain clinical measurements within normal limits will improve ?Outcome: Completed/Met ?Goal: Will remain free from infection ?Outcome: Completed/Met ?Goal: Diagnostic test results will improve ?Outcome: Completed/Met ?Goal: Respiratory complications will improve ?Outcome: Completed/Met ?Goal: Cardiovascular complication will be avoided ?Outcome: Completed/Met ?  ?Problem: Activity: ?Goal: Risk for activity intolerance will decrease ?Outcome: Completed/Met ?  ?Problem: Nutrition: ?Goal: Adequate nutrition will be maintained ?Outcome: Completed/Met ?  ?Problem: Coping: ?Goal: Level of anxiety will decrease ?Outcome: Completed/Met ?  ?Problem: Elimination: ?Goal: Will not experience complications related to bowel motility ?Outcome: Completed/Met ?Goal: Will not experience complications related to urinary retention ?Outcome: Completed/Met ?  ?Problem: Pain Managment: ?Goal: General experience of comfort will improve ?Outcome: Completed/Met ?  ?Problem: Skin Integrity: ?Goal: Risk for impaired skin integrity will decrease ?Outcome: Completed/Met ?  ?Problem: Safety: ?Goal: Ability to remain free from injury will improve ?Outcome: Completed/Met ?  ?

## 2022-02-05 NOTE — Progress Notes (Addendum)
HEMATOLOGY-ONCOLOGY PROGRESS NOTE  ASSESSMENT AND PLAN: #1: Cold Agglutinin related chronic hemolytic anemia. Her hemoglobin tends to be lower during winter and has been in the 6-8 range. Overall stable hemoglobin levels in the 6-7 range with folic acid replacement and cold avoidance.   #2 IgM lambda MGUS - cannot rule out possibility of lymphoplasmacytic lymphoma though less likely given chronicity of her condition. Flow cytometry- no clonal B cell lymphoma    #3 History of previous B12 deficiency   #4 Low back pain -compression fracture L4 -MRI L spine -Acute/subacute superior endplate fracture at L4 with loss of height of 10-20%. No retropulsed bone. This is quite likely the cause of the low back pain. This was probably present on the radiographs of 01/20/2018, but not diagnosable because of the spinal curvature and unlevel endplates. Old healed superior endplate fracture at P32   #5 Spiculated Lung nodule in right upper lobe  First seen on 05/28/18 CTA Chest Hasn't changed over 6 month interval with repeat CT Chest in February 2020   08/13/2019 CT Chest ([id number]) revealed "4 mm posterior right upper lobe pulmonary nodule, unchanged. Dedicated follow-up imaging is not required per Fleischner Society guidelines. This recommendation follows the consensus statement Guidelines for Management of Small Pulmonary Nodules Detected on CT Images: From the Fleischner Society 2017; Radiology 2017; 284:228-243. 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   Fecal impaction ([place]) 11/05/2021   Abnormal CT scan 11/05/2021   Pneumonia 10/31/2021   Acute respiratory failure with hypoxia ([place]) 10/30/2021   Cellulitis of right lower extremity 10/30/2021   Left 9th rib fracture 10/05/2021   Dementia ([place])  10/03/2021   Intraventricular hemorrhage ([place]) 10/03/2021   Hip fracture ([place]) 10/02/2021   CAP (community acquired pneumonia) 08/10/2021   Sepsis due to undetermined organism ([place]) 08/09/2021   Aortic atherosclerosis ([place]) 08/09/2021   COPD (chronic obstructive pulmonary disease) ([facility])     Elevated transaminase level     Symptomatic anemia 05/28/2018   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 ([place]) 02/04/2018   Occult blood in stools 02/04/2018   Ureteral calculus 11/12/2015   Breast cancer screening, high risk patient 08/29/2015   Acute delirium 08/07/2015   Postoperative anemia due to acute blood loss 08/06/2015   Protein-calorie malnutrition, severe 08/04/2015   Closed intertrochanteric fracture of left femur ([place]) 08/03/2015   Fall 08/03/2015   Closed right hip fracture ([place]) 08/03/2015   Hyperbilirubinemia 08/01/2013   Hypokalemia 07/30/2013   Vitamin B 12 deficiency 08/07/2012   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    PLAN: -The patient's labs from today have been reviewed.  Her hemoglobin is 9.6 after receiving 2 units PRBCs.  Unclear how accurate her hemoglobin is today. -Primary team planning to discharge the patient back to facility today.  Therefore, I have arranged for an outpatient lab and blood transfusion in the next 2 to 3 days in our office.  She will need very close monitoring of her hemoglobin. -All blood test need to be done at 37 C for accurate  results.  The lab tubes need to be warm to 37 C for accurate results. -All blood transfusions need to go through a blood warmer. -We recommend transfusion for hemoglobin less than 7. -The patient has significant dementia.  Goals of  care have been discussed with the patient and her family members.  Hospice has been recommended due to her dementia which is her life limiting diagnosis. -For her cold agglutinin disease, discussed treatment options including Sutimlimab.  Potential adverse effects of this treatment have been discussed with her family members including risk for respiratory infections, infusion reaction, and risk of autoimmune disease.  She would need to be vaccinated against encapsulated bacteria 2 weeks prior to initiation of this treatment. -I members contacted our office today indicated that they would like to pursue treatment. -Our office will begin to make arrangements to initiate vaccinations and treatment. -We will continue supportive transfusion.   Mikey Bussing, DNP, AGPCNP-BC, AOCNP  SUBJECTIVE: Ms. Bastedo was seen in our office yesterday for routine follow-up.  She was sent to the emergency department due to a hemoglobin of 4.7.  She received 2 units PRBCs for blood warmer.  This morning, her hemoglobin is 9.6.  This morning, the patient remains pleasantly confused.  She is trying to climb out of bed.  No bleeding reported.  No family at the bedside at the time my visit.  Goals of care discussion was had yesterday in our office.  Hospice was recommended due to her dementia.  We also discussed a trial of sutimlimab for her cold agglutinin disease.  Family has indicated that they would like to pursue treatment.  REVIEW OF SYSTEMS:   Review of Systems  Reason unable to perform ROS: Unable to obtain secondary to dementia.   I have reviewed the past medical history, past surgical history, social history and family history with the patient and they are unchanged from previous note.   PHYSICAL EXAMINATION: ECOG PERFORMANCE STATUS: 3 - Symptomatic, >50% confined to bed  Vitals:   02/05/22 0444 02/05/22 1358  BP: (!) 122/57 (!) 129/54  Pulse: 83 79  Resp: 18 16  Temp: 98.6 F (37 C) 98.6 F (37 C)   SpO2: 94% 92%   Filed Weights   02/04/22 1511  Weight: 35.3 kg    Intake/Output from previous day: 05/15 0701 - 05/16 0700 In: 640 [Blood:640] Out: -   Physical Exam Vitals reviewed.  Constitutional:      Comments: Pleasantly confused  Eyes:     General: No scleral icterus.    Conjunctiva/sclera: Conjunctivae normal.  Pulmonary:     Effort: Pulmonary effort is normal. No respiratory distress.  Abdominal:     Palpations: Abdomen is soft.     Tenderness: There is no abdominal tenderness.  Skin:    General: Skin is warm and dry.  Neurological:     Mental Status: She is alert. She is disoriented.    LABORATORY DATA:  I have reviewed the data as listed    Latest Ref Rng & Units 02/05/2022    5:58 AM 02/04/2022    8:59 AM 01/30/2022    8:55 AM  CMP  Glucose 70 - 99 mg/dL 154   104   92    BUN 8 - 23 mg/dL '15   12   10    '$ Creatinine 0.44 - 1.00 mg/dL 0.39   0.43   0.33    Sodium 135 - 145 mmol/L 138   139   139    Potassium  3.5 - 5.1 mmol/L 4.6   4.3   3.7    Chloride 98 - 111 mmol/L 100   99   104    CO2 22 - 32 mmol/L '28   31   27    '$ Calcium 8.9 - 10.3 mg/dL 8.1   8.5   7.7    Total Protein 6.5 - 8.1 g/dL 5.9   6.3   6.0    Total Bilirubin 0.3 - 1.2 mg/dL 2.9   3.8   3.6    Alkaline Phos 38 - 126 U/L 97   92   109    AST 15 - 41 U/L 30   51   40    ALT 0 - 44 U/L '15   14   16      '$ Lab Results  Component Value Date   WBC 23.5 (H) 02/05/2022   HGB 9.6 (L) 02/05/2022   HCT 27.5 (L) 02/05/2022   MCV 102.2 (H) 02/05/2022   PLT 483 (H) 02/05/2022   NEUTROABS 7.5 02/04/2022    No results found for: CEA1, CEA, UVO536, CA125, PSA1  DG Hip Unilat W or Wo Pelvis 2-3 Views Left  Result Date: 02/04/2022 CLINICAL DATA:  Fall?, complains of diffuse left hip pain. Prior surgery to both hips. EXAM: DG HIP (WITH OR WITHOUT PELVIS) 2-3V LEFT COMPARISON:  None Available. FINDINGS: Status post bilateral cephalomedullary nail placement. No perihardware loosening or evidence of  periprosthetic fracture. Diffuse osteopenia. IMPRESSION: Status post bilateral cephalomedullary nail placement without evidence of acute periprosthetic fracture or loosening. Electronically Signed   By: Keane Police D.O.   On: 02/04/2022 12:34     Future Appointments  Date Time Provider [place]  02/07/2022 11:30 AM [facility] LAB [facility] None  02/08/2022  8:30 AM [facility] INFUSION [facility] None  02/19/2022  8:30 AM [facility] LAB [facility] None  02/20/2022  8:30 AM [facility] INFUSION [facility] None  03/04/2022  8:30 AM [facility] LAB [facility] None  03/05/2022  8:30 AM [facility] INFUSION [facility] None  03/18/2022  8:30 AM [facility] LAB [facility] None  03/19/2022  8:30 AM [facility] INFUSION [facility] None  04/01/2022  8:30 AM [facility] LAB [facility] None  04/02/2022  8:30 AM [facility] INFUSION [facility] None      LOS: 0 days    ADDENDUM  .Patient was Personally and independently interviewed, examined and relevant elements of the history of present illness were reviewed in details and an assessment and plan was created. All elements of the patient's history of present illness , assessment and plan were discussed in details with Mikey Bussing DNP. The above documentation reflects our combined findings assessment and plan.   Sullivan Lone MD MS

## 2022-02-05 NOTE — Progress Notes (Unsigned)
{  Select_TRH_Note:[id number]} 

## 2022-02-07 ENCOUNTER — Other Ambulatory Visit: Payer: Self-pay

## 2022-02-07 ENCOUNTER — Inpatient Hospital Stay: Payer: Medicare Other

## 2022-02-07 ENCOUNTER — Telehealth: Payer: Self-pay

## 2022-02-07 ENCOUNTER — Other Ambulatory Visit: Payer: Self-pay | Admitting: Pharmacist

## 2022-02-07 ENCOUNTER — Other Ambulatory Visit: Payer: Self-pay | Admitting: Hematology

## 2022-02-07 DIAGNOSIS — D5912 Cold autoimmune hemolytic anemia: Secondary | ICD-10-CM

## 2022-02-07 DIAGNOSIS — D841 Defects in the complement system: Secondary | ICD-10-CM

## 2022-02-07 LAB — CMP (CANCER CENTER ONLY)
ALT: 12 U/L (ref 0–44)
AST: 26 U/L (ref 15–41)
Albumin: 3.5 g/dL (ref 3.5–5.0)
Alkaline Phosphatase: 102 U/L (ref 38–126)
Anion gap: 6 (ref 5–15)
BUN: 19 mg/dL (ref 8–23)
CO2: 31 mmol/L (ref 22–32)
Calcium: 8.6 mg/dL — ABNORMAL LOW (ref 8.9–10.3)
Chloride: 98 mmol/L (ref 98–111)
Creatinine: 0.38 mg/dL — ABNORMAL LOW (ref 0.44–1.00)
GFR, Estimated: >60 mL/min (ref >60)
Glucose, Bld: 106 mg/dL — ABNORMAL HIGH (ref 70–99)
Potassium: 4.3 mmol/L (ref 3.5–5.1)
Sodium: 135 mmol/L (ref 135–145)
Total Bilirubin: 4 mg/dL (ref 0.3–1.2)
Total Protein: 6.2 g/dL — ABNORMAL LOW (ref 6.5–8.1)

## 2022-02-07 LAB — CBC WITH DIFFERENTIAL (CANCER CENTER ONLY)
Abs Immature Granulocytes: 0 10*3/uL (ref 0.00–0.07)
Band Neutrophils: 5 %
Basophils Absolute: 0.2 10*3/uL — ABNORMAL HIGH (ref 0.0–0.1)
Basophils Relative: 1 %
Eosinophils Absolute: 0 10*3/uL (ref 0.0–0.5)
Eosinophils Relative: 0 %
HCT: 17.4 % — ABNORMAL LOW (ref 36.0–46.0)
Hemoglobin: 6.2 g/dL — CL (ref 12.0–15.0)
Lymphocytes Relative: 8 %
Lymphs Abs: 1.4 10*3/uL (ref 0.7–4.0)
MCH: 34.4 pg — ABNORMAL HIGH (ref 26.0–34.0)
MCHC: 35.6 g/dL (ref 30.0–36.0)
MCV: 96.7 fL (ref 80.0–100.0)
Monocytes Absolute: 1 10*3/uL (ref 0.1–1.0)
Monocytes Relative: 6 %
Neutro Abs: 14.7 10*3/uL — ABNORMAL HIGH (ref 1.7–7.7)
Neutrophils Relative %: 80 %
Platelet Count: 759 10*3/uL — ABNORMAL HIGH (ref 150–400)
RBC: 1.8 MIL/uL — ABNORMAL LOW (ref 3.87–5.11)
RDW: 14.9 % (ref 11.5–15.5)
Smear Review: NORMAL
WBC Count: 17.3 10*3/uL — ABNORMAL HIGH (ref 4.0–10.5)
nRBC: 0 % (ref 0.0–0.2)

## 2022-02-07 LAB — PREPARE RBC (CROSSMATCH)

## 2022-02-07 LAB — SAMPLE TO BLOOD BANK

## 2022-02-07 MED ORDER — MENINGOCOCCAL A C Y&W-135 OLIG IM SOLR: 0.5000 mL | Freq: Once | INTRAMUSCULAR | Status: DC

## 2022-02-07 MED ORDER — MENINGOCOCCAL VAC B (OMV) IM SUSY: 0.5000 mL | PREFILLED_SYRINGE | Freq: Once | INTRAMUSCULAR | Status: DC

## 2022-02-07 MED ORDER — PNEUMOCOCCAL 20-VAL CONJ VACC 0.5 ML IM SUSY: 0.5000 mL | PREFILLED_SYRINGE | Freq: Once | INTRAMUSCULAR | Status: DC

## 2022-02-07 MED ORDER — HAEMOPHILUS B POLYSAC CONJ VAC IM SOLR: 0.5000 mL | Freq: Once | INTRAMUSCULAR | Status: DC

## 2022-02-07 NOTE — Telephone Encounter (Signed)
Pt sister called asking about hbg results. Informed sister that hbg was 6.2 and pt will need to be in tomorrow for blood transfusion. Pt sister verbalized understanding and had no further questions.

## 2022-02-07 NOTE — Telephone Encounter (Signed)
CRITICAL VALUE STICKER  CRITICAL VALUE: hbg 6.2  RECEIVER (on-site recipient of call): [place] NOTIFIED: 5/18/[id number]  MESSENGER (representative from lab): lauren  MD NOTIFIED: Dr.Kale  TIME OF NOTIFICATION:1205  RESPONSE:  see orders, transfusion tomorrow

## 2022-02-07 NOTE — Progress Notes (Signed)
Orders placed for 2 units PRBCs for transfusion tomorrow 02/08/22, confirmed with blood bank

## 2022-02-07 NOTE — Progress Notes (Signed)
CRITICAL VALUE STICKER  CRITICAL VALUE: total billi  RECEIVER (on-site recipient of call): Zehava Turski  DATE & TIME NOTIFIED: 02/07/22 1230  MESSENGER (representative from lab): Smith Mince.  MD NOTIFIED: Dr.kale  TIME OF NOTIFICATION: 6950  RESPONSE: MD aware

## 2022-02-08 ENCOUNTER — Other Ambulatory Visit: Payer: Medicare Other

## 2022-02-08 ENCOUNTER — Other Ambulatory Visit: Payer: Self-pay

## 2022-02-08 ENCOUNTER — Inpatient Hospital Stay: Payer: Medicare Other

## 2022-02-08 DIAGNOSIS — D5912 Cold autoimmune hemolytic anemia: Secondary | ICD-10-CM

## 2022-02-08 MED ORDER — ACETAMINOPHEN 325 MG PO TABS
650.0000 mg | ORAL_TABLET | Freq: Once | ORAL | Status: AC
  Administered 2022-02-08: 650 mg via ORAL
  Filled 2022-02-08: qty 2

## 2022-02-08 MED ORDER — SODIUM CHLORIDE 0.9% IV SOLUTION
250.0000 mL | Freq: Once | INTRAVENOUS | Status: AC
  Administered 2022-02-08: 250 mL via INTRAVENOUS

## 2022-02-08 MED ORDER — MENINGOCOCCAL A C Y&W-135 OLIG IM SOLR
0.5000 mL | Freq: Once | INTRAMUSCULAR | Status: AC
  Administered 2022-02-08: 0.5 mL via INTRAMUSCULAR
  Filled 2022-02-08: qty 0.5

## 2022-02-08 MED ORDER — HAEMOPHILUS B POLYSAC CONJ VAC IM SOLR
0.5000 mL | Freq: Once | INTRAMUSCULAR | Status: AC
  Administered 2022-02-08: 0.5 mL via INTRAMUSCULAR
  Filled 2022-02-08: qty 0.5

## 2022-02-08 MED ORDER — PNEUMOCOCCAL 20-VAL CONJ VACC 0.5 ML IM SUSY
0.5000 mL | PREFILLED_SYRINGE | Freq: Once | INTRAMUSCULAR | Status: AC
  Administered 2022-02-08: 0.5 mL via INTRAMUSCULAR
  Filled 2022-02-08: qty 0.5

## 2022-02-08 MED ORDER — MENINGOCOCCAL VAC B (OMV) IM SUSY
0.5000 mL | PREFILLED_SYRINGE | Freq: Once | INTRAMUSCULAR | Status: AC
  Administered 2022-02-08: 0.5 mL via INTRAMUSCULAR
  Filled 2022-02-08: qty 0.5

## 2022-02-08 MED ORDER — METHYLPREDNISOLONE SODIUM SUCC 40 MG IJ SOLR
40.0000 mg | Freq: Once | INTRAMUSCULAR | Status: AC
  Administered 2022-02-08: 40 mg via INTRAVENOUS
  Filled 2022-02-08: qty 1

## 2022-02-08 MED ORDER — ACETAMINOPHEN 500 MG PO TABS
1000.0000 mg | ORAL_TABLET | Freq: Once | ORAL | Status: AC
  Administered 2022-02-08: 1000 mg via ORAL
  Filled 2022-02-08: qty 2

## 2022-02-08 NOTE — Progress Notes (Signed)
At 11:11, pt began complaining of chest pain. Transfusion paused. Vital signs taken. Upon further questioning, pt said she was not having any chest pain and had not been having any chest pain prior. Transfusion was resumed. Pt swiftly began complaining once again of chest pain. Transfusion paused, MD notified. EKG obtained. EKG showed normal sinus rhythm. MD advised RN to give '1000mg'$  Tylenol and resume transfusion.

## 2022-02-08 NOTE — Progress Notes (Signed)
Per Irene Limbo MD, pt cannot receive ordered vaccinations until Pharmacy verifies that pt will be receiving Sutimlimab.

## 2022-02-08 NOTE — Patient Instructions (Signed)
Blood Transfusion, Adult, Care After ?This sheet gives you information about how to care for yourself after your procedure. Your health care provider may also give you more specific instructions. If you have problems or questions, contact your health care provider. ?What can I expect after the procedure? ?After the procedure, it is common to have: ?Bruising and soreness where the IV was inserted. ?A headache. ?Follow these instructions at home: ?IV insertion site care ? ?  ? ?Follow instructions from your health care provider about how to take care of your IV insertion site. Make sure you: ?Wash your hands with soap and water before and after you change your bandage (dressing). If soap and water are not available, use hand sanitizer. ?Change your dressing as told by your health care provider. ?Check your IV insertion site every day for signs of infection. Check for: ?Redness, swelling, or pain. ?Bleeding from the site. ?Warmth. ?Pus or a bad smell. ?General instructions ?Take over-the-counter and prescription medicines only as told by your health care provider. ?Rest as told by your health care provider. ?Return to your normal activities as told by your health care provider. ?Keep all follow-up visits as told by your health care provider. This is important. ?Contact a health care provider if: ?You have itching or red, swollen areas of skin (hives). ?You feel anxious. ?You feel weak after doing your normal activities. ?You have redness, swelling, warmth, or pain around the IV insertion site. ?You have blood coming from the IV insertion site that does not stop with pressure. ?You have pus or a bad smell coming from your IV insertion site. ?Get help right away if: ?You have symptoms of a serious allergic or immune system reaction, including: ?Trouble breathing or shortness of breath. ?Swelling of the face or feeling flushed. ?Fever or chills. ?Pain in the head, back, or chest. ?Dark urine or blood in the  urine. ?Widespread rash. ?Fast heartbeat. ?Feeling dizzy or light-headed. ?If you receive your blood transfusion in an outpatient setting, you will be told whom to contact to report any reactions. ?These symptoms may represent a serious problem that is an emergency. Do not wait to see if the symptoms will go away. Get medical help right away. Call your local emergency services (911 in the [place] Do not drive yourself to the hospital. ?Summary ?Bruising and tenderness around the IV insertion site are common. ?Check your IV insertion site every day for signs of infection. ?Rest as told by your health care provider. Return to your normal activities as told by your health care provider. ?Get help right away for symptoms of a serious allergic or immune system reaction to blood transfusion. ?This information is not intended to replace advice given to you by your health care provider. Make sure you discuss any questions you have with your health care provider. ?Document Revised: 01/04/2021 Document Reviewed: 03/04/2019 ?Elsevier Patient Education ? 2023 [employer] ? ?

## 2022-02-10 LAB — TYPE AND SCREEN
ABO/RH(D): B POS
Antibody Screen: NEGATIVE
Unit division: 0
Unit division: 0

## 2022-02-10 LAB — BPAM RBC
Blood Product Expiration Date: 202306192359
Blood Product Expiration Date: 202306192359
ISSUE DATE / TIME: 202305190947
ISSUE DATE / TIME: 202305190947
Unit Type and Rh: 5100
Unit Type and Rh: 5100

## 2022-02-14 ENCOUNTER — Other Ambulatory Visit: Payer: Self-pay | Admitting: Hematology

## 2022-02-14 MED ORDER — PENICILLIN V POTASSIUM 500 MG PO TABS: 500.0000 mg | ORAL_TABLET | Freq: Two times a day (BID) | ORAL | 3 refills | Status: AC

## 2022-02-14 NOTE — Progress Notes (Unsigned)
Penicillin VK and eculizumab orders placed

## 2022-02-15 ENCOUNTER — Other Ambulatory Visit: Payer: Self-pay

## 2022-02-15 DIAGNOSIS — D5912 Cold autoimmune hemolytic anemia: Secondary | ICD-10-CM

## 2022-02-19 ENCOUNTER — Inpatient Hospital Stay: Payer: Medicare Other

## 2022-02-19 ENCOUNTER — Other Ambulatory Visit: Payer: Self-pay

## 2022-02-19 DIAGNOSIS — D5912 Cold autoimmune hemolytic anemia: Secondary | ICD-10-CM | POA: Diagnosis not present

## 2022-02-19 LAB — CMP (CANCER CENTER ONLY)
ALT: 9 U/L (ref 0–44)
AST: 13 U/L — ABNORMAL LOW (ref 15–41)
Albumin: 3.6 g/dL (ref 3.5–5.0)
Alkaline Phosphatase: 122 U/L (ref 38–126)
Anion gap: 7 (ref 5–15)
BUN: 8 mg/dL (ref 8–23)
CO2: 31 mmol/L (ref 22–32)
Calcium: 8.4 mg/dL — ABNORMAL LOW (ref 8.9–10.3)
Chloride: 100 mmol/L (ref 98–111)
Creatinine: 0.51 mg/dL (ref 0.44–1.00)
GFR, Estimated: >60 mL/min
Glucose, Bld: 98 mg/dL (ref 70–99)
Potassium: 3.5 mmol/L (ref 3.5–5.1)
Sodium: 138 mmol/L (ref 135–145)
Total Bilirubin: 1.7 mg/dL — ABNORMAL HIGH (ref 0.3–1.2)
Total Protein: 5.7 g/dL — ABNORMAL LOW (ref 6.5–8.1)

## 2022-02-19 LAB — CBC WITH DIFFERENTIAL (CANCER CENTER ONLY)
Abs Immature Granulocytes: 1.4 10*3/uL — ABNORMAL HIGH (ref 0.00–0.07)
Band Neutrophils: 1 %
Basophils Absolute: 0 10*3/uL (ref 0.0–0.1)
Basophils Relative: 0 %
Eosinophils Absolute: 0.2 10*3/uL (ref 0.0–0.5)
Eosinophils Relative: 1 %
HCT: 19 % — ABNORMAL LOW (ref 36.0–46.0)
Hemoglobin: 6.5 g/dL — CL (ref 12.0–15.0)
Lymphocytes Relative: 22 %
Lymphs Abs: 3.8 10*3/uL (ref 0.7–4.0)
MCH: 31.6 pg (ref 26.0–34.0)
MCHC: 34.2 g/dL (ref 30.0–36.0)
MCV: 92.2 fL (ref 80.0–100.0)
Metamyelocytes Relative: 4 %
Monocytes Absolute: 0.9 10*3/uL (ref 0.1–1.0)
Monocytes Relative: 5 %
Myelocytes: 4 %
Neutro Abs: 11.1 10*3/uL — ABNORMAL HIGH (ref 1.7–7.7)
Neutrophils Relative %: 63 %
Platelet Count: 950 10*3/uL (ref 150–400)
RBC: 2.06 MIL/uL — ABNORMAL LOW (ref 3.87–5.11)
RDW: 18.4 % — ABNORMAL HIGH (ref 11.5–15.5)
Smear Review: INCREASED
WBC Count: 17.4 10*3/uL — ABNORMAL HIGH (ref 4.0–10.5)
nRBC: 1 % — ABNORMAL HIGH (ref 0.0–0.2)

## 2022-02-19 LAB — SAMPLE TO BLOOD BANK

## 2022-02-19 LAB — PREPARE RBC (CROSSMATCH)

## 2022-02-20 ENCOUNTER — Inpatient Hospital Stay: Payer: Medicare Other

## 2022-02-20 DIAGNOSIS — D5912 Cold autoimmune hemolytic anemia: Secondary | ICD-10-CM | POA: Diagnosis not present

## 2022-02-20 MED ORDER — METHYLPREDNISOLONE SODIUM SUCC 40 MG IJ SOLR
40.0000 mg | Freq: Once | INTRAMUSCULAR | Status: AC
  Administered 2022-02-20: 40 mg via INTRAVENOUS
  Filled 2022-02-20: qty 1

## 2022-02-20 MED ORDER — ACETAMINOPHEN 325 MG PO TABS
650.0000 mg | ORAL_TABLET | Freq: Once | ORAL | Status: AC
  Administered 2022-02-20: 650 mg via ORAL
  Filled 2022-02-20: qty 2

## 2022-02-20 MED ORDER — SODIUM CHLORIDE 0.9% IV SOLUTION
250.0000 mL | Freq: Once | INTRAVENOUS | Status: AC
  Administered 2022-02-20: 250 mL via INTRAVENOUS

## 2022-02-20 NOTE — Patient Instructions (Signed)
Blood Transfusion, Adult, Care After ?This sheet gives you information about how to care for yourself after your procedure. Your health care provider may also give you more specific instructions. If you have problems or questions, contact your health care provider. ?What can I expect after the procedure? ?After the procedure, it is common to have: ?Bruising and soreness where the IV was inserted. ?A headache. ?Follow these instructions at home: ?IV insertion site care ? ?  ? ?Follow instructions from your health care provider about how to take care of your IV insertion site. Make sure you: ?Wash your hands with soap and water before and after you change your bandage (dressing). If soap and water are not available, use hand sanitizer. ?Change your dressing as told by your health care provider. ?Check your IV insertion site every day for signs of infection. Check for: ?Redness, swelling, or pain. ?Bleeding from the site. ?Warmth. ?Pus or a bad smell. ?General instructions ?Take over-the-counter and prescription medicines only as told by your health care provider. ?Rest as told by your health care provider. ?Return to your normal activities as told by your health care provider. ?Keep all follow-up visits as told by your health care provider. This is important. ?Contact a health care provider if: ?You have itching or red, swollen areas of skin (hives). ?You feel anxious. ?You feel weak after doing your normal activities. ?You have redness, swelling, warmth, or pain around the IV insertion site. ?You have blood coming from the IV insertion site that does not stop with pressure. ?You have pus or a bad smell coming from your IV insertion site. ?Get help right away if: ?You have symptoms of a serious allergic or immune system reaction, including: ?Trouble breathing or shortness of breath. ?Swelling of the face or feeling flushed. ?Fever or chills. ?Pain in the head, back, or chest. ?Dark urine or blood in the  urine. ?Widespread rash. ?Fast heartbeat. ?Feeling dizzy or light-headed. ?If you receive your blood transfusion in an outpatient setting, you will be told whom to contact to report any reactions. ?These symptoms may represent a serious problem that is an emergency. Do not wait to see if the symptoms will go away. Get medical help right away. Call your local emergency services (911 in the [place] Do not drive yourself to the hospital. ?Summary ?Bruising and tenderness around the IV insertion site are common. ?Check your IV insertion site every day for signs of infection. ?Rest as told by your health care provider. Return to your normal activities as told by your health care provider. ?Get help right away for symptoms of a serious allergic or immune system reaction to blood transfusion. ?This information is not intended to replace advice given to you by your health care provider. Make sure you discuss any questions you have with your health care provider. ?Document Revised: 01/04/2021 Document Reviewed: 03/04/2019 ?Elsevier Patient Education ? 2023 [employer] ? ?

## 2022-02-21 LAB — BPAM RBC
Blood Product Expiration Date: 202306152359
Blood Product Expiration Date: 202306262359
ISSUE DATE / TIME: 202305311037
ISSUE DATE / TIME: 202305311053
Unit Type and Rh: 5100
Unit Type and Rh: 7300

## 2022-02-21 LAB — TYPE AND SCREEN
ABO/RH(D): B POS
Antibody Screen: NEGATIVE
Unit division: 0
Unit division: 0

## 2022-03-01 ENCOUNTER — Telehealth: Payer: Self-pay

## 2022-03-01 ENCOUNTER — Other Ambulatory Visit: Payer: Self-pay

## 2022-03-01 DIAGNOSIS — D5912 Cold autoimmune hemolytic anemia: Secondary | ICD-10-CM

## 2022-03-01 NOTE — Telephone Encounter (Signed)
Pt referral for palliative per Dr.kale, pt scheduled for palliative appt at upcoming lab appointment. Spoke with [name], sister, to confirm. Introduced myself and palliative care, answered all questions. Pt family verbalized agreement and are aware of upcoming schedule. No further needs.

## 2022-03-04 ENCOUNTER — Other Ambulatory Visit: Payer: Self-pay

## 2022-03-04 ENCOUNTER — Inpatient Hospital Stay: Payer: Medicare Other | Attending: Hematology

## 2022-03-04 DIAGNOSIS — R531 Weakness: Secondary | ICD-10-CM | POA: Insufficient documentation

## 2022-03-04 DIAGNOSIS — Z90710 Acquired absence of both cervix and uterus: Secondary | ICD-10-CM | POA: Diagnosis not present

## 2022-03-04 DIAGNOSIS — D5912 Cold autoimmune hemolytic anemia: Secondary | ICD-10-CM | POA: Diagnosis present

## 2022-03-04 DIAGNOSIS — D472 Monoclonal gammopathy: Secondary | ICD-10-CM | POA: Diagnosis not present

## 2022-03-04 DIAGNOSIS — E538 Deficiency of other specified B group vitamins: Secondary | ICD-10-CM | POA: Insufficient documentation

## 2022-03-04 DIAGNOSIS — Z87891 Personal history of nicotine dependence: Secondary | ICD-10-CM | POA: Insufficient documentation

## 2022-03-04 DIAGNOSIS — Z79899 Other long term (current) drug therapy: Secondary | ICD-10-CM | POA: Diagnosis not present

## 2022-03-04 DIAGNOSIS — Z66 Do not resuscitate: Secondary | ICD-10-CM | POA: Diagnosis not present

## 2022-03-04 LAB — CBC WITH DIFFERENTIAL (CANCER CENTER ONLY)
Abs Immature Granulocytes: 0.13 10*3/uL — ABNORMAL HIGH (ref 0.00–0.07)
Basophils Absolute: 0.1 10*3/uL (ref 0.0–0.1)
Basophils Relative: 1 %
Eosinophils Absolute: 0.1 10*3/uL (ref 0.0–0.5)
Eosinophils Relative: 1 %
HCT: 18.8 % — ABNORMAL LOW (ref 36.0–46.0)
Hemoglobin: 6.3 g/dL — CL (ref 12.0–15.0)
Immature Granulocytes: 1 %
Lymphocytes Relative: 19 %
Lymphs Abs: 1.7 10*3/uL (ref 0.7–4.0)
MCH: 33.2 pg (ref 26.0–34.0)
MCHC: 33.5 g/dL (ref 30.0–36.0)
MCV: 98.9 fL (ref 80.0–100.0)
Monocytes Absolute: 0.7 10*3/uL (ref 0.1–1.0)
Monocytes Relative: 7 %
Neutro Abs: 6.5 10*3/uL (ref 1.7–7.7)
Neutrophils Relative %: 71 %
Platelet Count: 561 10*3/uL — ABNORMAL HIGH (ref 150–400)
RBC: 1.9 MIL/uL — ABNORMAL LOW (ref 3.87–5.11)
RDW: 21.2 % — ABNORMAL HIGH (ref 11.5–15.5)
WBC Count: 9.2 10*3/uL (ref 4.0–10.5)
nRBC: 0 % (ref 0.0–0.2)

## 2022-03-04 LAB — CMP (CANCER CENTER ONLY)
ALT: 11 U/L (ref 0–44)
AST: 31 U/L (ref 15–41)
Albumin: 3.7 g/dL (ref 3.5–5.0)
Alkaline Phosphatase: 94 U/L (ref 38–126)
Anion gap: 6 (ref 5–15)
BUN: 14 mg/dL (ref 8–23)
CO2: 31 mmol/L (ref 22–32)
Calcium: 8.9 mg/dL (ref 8.9–10.3)
Chloride: 101 mmol/L (ref 98–111)
Creatinine: 0.44 mg/dL (ref 0.44–1.00)
GFR, Estimated: >60 mL/min (ref >60)
Glucose, Bld: 101 mg/dL — ABNORMAL HIGH (ref 70–99)
Potassium: 3.7 mmol/L (ref 3.5–5.1)
Sodium: 138 mmol/L (ref 135–145)
Total Bilirubin: 1.7 mg/dL — ABNORMAL HIGH (ref 0.3–1.2)
Total Protein: 6 g/dL — ABNORMAL LOW (ref 6.5–8.1)

## 2022-03-04 LAB — SAMPLE TO BLOOD BANK

## 2022-03-04 LAB — PREPARE RBC (CROSSMATCH)

## 2022-03-05 ENCOUNTER — Inpatient Hospital Stay: Payer: Medicare Other

## 2022-03-05 DIAGNOSIS — D5912 Cold autoimmune hemolytic anemia: Secondary | ICD-10-CM

## 2022-03-05 MED ORDER — METHYLPREDNISOLONE SODIUM SUCC 40 MG IJ SOLR
40.0000 mg | Freq: Once | INTRAMUSCULAR | Status: AC
  Administered 2022-03-05: 40 mg via INTRAVENOUS
  Filled 2022-03-05: qty 1

## 2022-03-05 MED ORDER — SODIUM CHLORIDE 0.9% IV SOLUTION
250.0000 mL | Freq: Once | INTRAVENOUS | Status: AC
  Administered 2022-03-05: 250 mL via INTRAVENOUS

## 2022-03-05 MED ORDER — ACETAMINOPHEN 325 MG PO TABS
650.0000 mg | ORAL_TABLET | Freq: Once | ORAL | Status: AC
  Administered 2022-03-05: 650 mg via ORAL
  Filled 2022-03-05: qty 2

## 2022-03-05 NOTE — Patient Instructions (Signed)
Blood Transfusion, Adult, Care After ?This sheet gives you information about how to care for yourself after your procedure. Your health care provider may also give you more specific instructions. If you have problems or questions, contact your health care provider. ?What can I expect after the procedure? ?After the procedure, it is common to have: ?Bruising and soreness where the IV was inserted. ?A headache. ?Follow these instructions at home: ?IV insertion site care ? ?  ? ?Follow instructions from your health care provider about how to take care of your IV insertion site. Make sure you: ?Wash your hands with soap and water before and after you change your bandage (dressing). If soap and water are not available, use hand sanitizer. ?Change your dressing as told by your health care provider. ?Check your IV insertion site every day for signs of infection. Check for: ?Redness, swelling, or pain. ?Bleeding from the site. ?Warmth. ?Pus or a bad smell. ?General instructions ?Take over-the-counter and prescription medicines only as told by your health care provider. ?Rest as told by your health care provider. ?Return to your normal activities as told by your health care provider. ?Keep all follow-up visits as told by your health care provider. This is important. ?Contact a health care provider if: ?You have itching or red, swollen areas of skin (hives). ?You feel anxious. ?You feel weak after doing your normal activities. ?You have redness, swelling, warmth, or pain around the IV insertion site. ?You have blood coming from the IV insertion site that does not stop with pressure. ?You have pus or a bad smell coming from your IV insertion site. ?Get help right away if: ?You have symptoms of a serious allergic or immune system reaction, including: ?Trouble breathing or shortness of breath. ?Swelling of the face or feeling flushed. ?Fever or chills. ?Pain in the head, back, or chest. ?Dark urine or blood in the  urine. ?Widespread rash. ?Fast heartbeat. ?Feeling dizzy or light-headed. ?If you receive your blood transfusion in an outpatient setting, you will be told whom to contact to report any reactions. ?These symptoms may represent a serious problem that is an emergency. Do not wait to see if the symptoms will go away. Get medical help right away. Call your local emergency services (911 in the [place] Do not drive yourself to the hospital. ?Summary ?Bruising and tenderness around the IV insertion site are common. ?Check your IV insertion site every day for signs of infection. ?Rest as told by your health care provider. Return to your normal activities as told by your health care provider. ?Get help right away for symptoms of a serious allergic or immune system reaction to blood transfusion. ?This information is not intended to replace advice given to you by your health care provider. Make sure you discuss any questions you have with your health care provider. ?Document Revised: 01/04/2021 Document Reviewed: 03/04/2019 ?Elsevier Patient Education ? 2023 [employer] ? ?

## 2022-03-06 LAB — TYPE AND SCREEN
ABO/RH(D): B POS
Antibody Screen: NEGATIVE
Unit division: 0
Unit division: 0

## 2022-03-06 LAB — BPAM RBC
Blood Product Expiration Date: 202307062359
Blood Product Expiration Date: 202307062359
ISSUE DATE / TIME: 202306131005
ISSUE DATE / TIME: 202306131005
Unit Type and Rh: 5100
Unit Type and Rh: 5100

## 2022-03-14 ENCOUNTER — Other Ambulatory Visit: Payer: Self-pay

## 2022-03-14 DIAGNOSIS — D5912 Cold autoimmune hemolytic anemia: Secondary | ICD-10-CM

## 2022-03-18 ENCOUNTER — Other Ambulatory Visit: Payer: Self-pay

## 2022-03-18 ENCOUNTER — Encounter: Payer: Self-pay | Admitting: Nurse Practitioner

## 2022-03-18 ENCOUNTER — Inpatient Hospital Stay: Payer: Medicare Other

## 2022-03-18 ENCOUNTER — Inpatient Hospital Stay (HOSPITAL_BASED_OUTPATIENT_CLINIC_OR_DEPARTMENT_OTHER): Payer: Medicare Other | Admitting: Nurse Practitioner

## 2022-03-18 VITALS — BP 103/41 | HR 71 | Temp 98.5°F | Resp 16 | Ht 61.0 in

## 2022-03-18 DIAGNOSIS — M25551 Pain in right hip: Secondary | ICD-10-CM | POA: Diagnosis not present

## 2022-03-18 DIAGNOSIS — Z515 Encounter for palliative care: Secondary | ICD-10-CM | POA: Diagnosis not present

## 2022-03-18 DIAGNOSIS — R530 Neoplastic (malignant) related fatigue: Secondary | ICD-10-CM

## 2022-03-18 DIAGNOSIS — D5912 Cold autoimmune hemolytic anemia: Secondary | ICD-10-CM

## 2022-03-18 DIAGNOSIS — Z7189 Other specified counseling: Secondary | ICD-10-CM | POA: Diagnosis not present

## 2022-03-18 DIAGNOSIS — M25552 Pain in left hip: Secondary | ICD-10-CM

## 2022-03-18 LAB — CMP (CANCER CENTER ONLY)
ALT: 10 U/L (ref 0–44)
AST: 22 U/L (ref 15–41)
Albumin: 3.5 g/dL (ref 3.5–5.0)
Alkaline Phosphatase: 100 U/L (ref 38–126)
Anion gap: 6 (ref 5–15)
BUN: 14 mg/dL (ref 8–23)
CO2: 31 mmol/L (ref 22–32)
Calcium: 8.5 mg/dL — ABNORMAL LOW (ref 8.9–10.3)
Chloride: 102 mmol/L (ref 98–111)
Creatinine: 0.43 mg/dL — ABNORMAL LOW (ref 0.44–1.00)
GFR, Estimated: >60 mL/min (ref >60)
Glucose, Bld: 119 mg/dL — ABNORMAL HIGH (ref 70–99)
Potassium: 3.6 mmol/L (ref 3.5–5.1)
Sodium: 139 mmol/L (ref 135–145)
Total Bilirubin: 1 mg/dL (ref 0.3–1.2)
Total Protein: 5.8 g/dL — ABNORMAL LOW (ref 6.5–8.1)

## 2022-03-18 LAB — CBC WITH DIFFERENTIAL (CANCER CENTER ONLY)
Abs Immature Granulocytes: 0.56 10*3/uL — ABNORMAL HIGH (ref 0.00–0.07)
Basophils Absolute: 0.1 10*3/uL (ref 0.0–0.1)
Basophils Relative: 1 %
Eosinophils Absolute: 0.2 10*3/uL (ref 0.0–0.5)
Eosinophils Relative: 2 %
HCT: 18.1 % — ABNORMAL LOW (ref 36.0–46.0)
Hemoglobin: 6.2 g/dL — CL (ref 12.0–15.0)
Immature Granulocytes: 4 %
Lymphocytes Relative: 14 %
Lymphs Abs: 1.8 10*3/uL (ref 0.7–4.0)
MCH: 32.5 pg (ref 26.0–34.0)
MCHC: 34.3 g/dL (ref 30.0–36.0)
MCV: 94.8 fL (ref 80.0–100.0)
Monocytes Absolute: 0.8 10*3/uL (ref 0.1–1.0)
Monocytes Relative: 6 %
Neutro Abs: 9.5 10*3/uL — ABNORMAL HIGH (ref 1.7–7.7)
Neutrophils Relative %: 73 %
Platelet Count: 562 10*3/uL — ABNORMAL HIGH (ref 150–400)
RBC: 1.91 MIL/uL — ABNORMAL LOW (ref 3.87–5.11)
RDW: 19.1 % — ABNORMAL HIGH (ref 11.5–15.5)
WBC Count: 12.9 10*3/uL — ABNORMAL HIGH (ref 4.0–10.5)
nRBC: 0 % (ref 0.0–0.2)

## 2022-03-18 LAB — PREPARE RBC (CROSSMATCH)

## 2022-03-18 LAB — SAMPLE TO BLOOD BANK

## 2022-03-18 MED ORDER — ACETAMINOPHEN 500 MG PO TABS: 500.0000 mg | ORAL_TABLET | Freq: Four times a day (QID) | ORAL | 0 refills | Status: AC | PRN

## 2022-03-19 ENCOUNTER — Inpatient Hospital Stay: Payer: Medicare Other

## 2022-03-19 DIAGNOSIS — D5912 Cold autoimmune hemolytic anemia: Secondary | ICD-10-CM

## 2022-03-19 MED ORDER — ACETAMINOPHEN 325 MG PO TABS
650.0000 mg | ORAL_TABLET | Freq: Once | ORAL | Status: AC
  Administered 2022-03-19: 650 mg via ORAL
  Filled 2022-03-19: qty 2

## 2022-03-19 MED ORDER — SODIUM CHLORIDE 0.9% IV SOLUTION
250.0000 mL | Freq: Once | INTRAVENOUS | Status: AC
  Administered 2022-03-19: 250 mL via INTRAVENOUS

## 2022-03-19 MED ORDER — SODIUM CHLORIDE 0.9 % IV SOLN: Freq: Once | INTRAVENOUS | Status: DC

## 2022-03-19 MED ORDER — METHYLPREDNISOLONE SODIUM SUCC 40 MG IJ SOLR
40.0000 mg | Freq: Once | INTRAMUSCULAR | Status: AC
  Administered 2022-03-19: 40 mg via INTRAVENOUS
  Filled 2022-03-19: qty 1

## 2022-03-20 LAB — BPAM RBC
Blood Product Expiration Date: 202307182359
Blood Product Expiration Date: 202307182359
ISSUE DATE / TIME: 202306270851
ISSUE DATE / TIME: 202306270851
Unit Type and Rh: 5100
Unit Type and Rh: 5100

## 2022-03-20 LAB — TYPE AND SCREEN
ABO/RH(D): B POS
Antibody Screen: NEGATIVE
Unit division: 0
Unit division: 0

## 2022-03-21 ENCOUNTER — Other Ambulatory Visit: Payer: Self-pay

## 2022-04-01 ENCOUNTER — Other Ambulatory Visit: Payer: Self-pay

## 2022-04-01 ENCOUNTER — Encounter: Payer: Self-pay | Admitting: Nurse Practitioner

## 2022-04-01 ENCOUNTER — Inpatient Hospital Stay: Payer: Medicare Other | Attending: Hematology

## 2022-04-01 ENCOUNTER — Inpatient Hospital Stay (HOSPITAL_BASED_OUTPATIENT_CLINIC_OR_DEPARTMENT_OTHER): Payer: Medicare Other | Admitting: Nurse Practitioner

## 2022-04-01 VITALS — BP 128/45 | HR 68 | Temp 97.6°F | Resp 16

## 2022-04-01 DIAGNOSIS — Z7189 Other specified counseling: Secondary | ICD-10-CM

## 2022-04-01 DIAGNOSIS — D5912 Cold autoimmune hemolytic anemia: Secondary | ICD-10-CM

## 2022-04-01 DIAGNOSIS — R579 Shock, unspecified: Secondary | ICD-10-CM | POA: Diagnosis not present

## 2022-04-01 DIAGNOSIS — D649 Anemia, unspecified: Secondary | ICD-10-CM

## 2022-04-01 DIAGNOSIS — Z515 Encounter for palliative care: Secondary | ICD-10-CM | POA: Diagnosis not present

## 2022-04-01 DIAGNOSIS — R5383 Other fatigue: Secondary | ICD-10-CM

## 2022-04-01 LAB — CMP (CANCER CENTER ONLY)
ALT: 7 U/L (ref 0–44)
AST: 25 U/L (ref 15–41)
Albumin: 3.8 g/dL (ref 3.5–5.0)
Alkaline Phosphatase: 104 U/L (ref 38–126)
Anion gap: 7 (ref 5–15)
BUN: 21 mg/dL (ref 8–23)
CO2: 31 mmol/L (ref 22–32)
Calcium: 8.7 mg/dL — ABNORMAL LOW (ref 8.9–10.3)
Chloride: 100 mmol/L (ref 98–111)
Creatinine: 0.5 mg/dL (ref 0.44–1.00)
GFR, Estimated: >60 mL/min (ref >60)
Glucose, Bld: 84 mg/dL (ref 70–99)
Potassium: 4 mmol/L (ref 3.5–5.1)
Sodium: 138 mmol/L (ref 135–145)
Total Bilirubin: 1.8 mg/dL — ABNORMAL HIGH (ref 0.3–1.2)
Total Protein: 6.2 g/dL — ABNORMAL LOW (ref 6.5–8.1)

## 2022-04-01 LAB — CBC WITH DIFFERENTIAL (CANCER CENTER ONLY)
Abs Immature Granulocytes: 0.77 10*3/uL — ABNORMAL HIGH (ref 0.00–0.07)
Basophils Absolute: 0.1 10*3/uL (ref 0.0–0.1)
Basophils Relative: 1 %
Eosinophils Absolute: 0.2 10*3/uL (ref 0.0–0.5)
Eosinophils Relative: 1 %
HCT: 18.6 % — ABNORMAL LOW (ref 36.0–46.0)
Hemoglobin: 6.7 g/dL — CL (ref 12.0–15.0)
Immature Granulocytes: 6 %
Lymphocytes Relative: 15 %
Lymphs Abs: 1.8 10*3/uL (ref 0.7–4.0)
MCH: 35.8 pg — ABNORMAL HIGH (ref 26.0–34.0)
MCHC: 36 g/dL (ref 30.0–36.0)
MCV: 99.5 fL (ref 80.0–100.0)
Monocytes Absolute: 1.4 10*3/uL — ABNORMAL HIGH (ref 0.1–1.0)
Monocytes Relative: 11 %
Neutro Abs: 8.1 10*3/uL — ABNORMAL HIGH (ref 1.7–7.7)
Neutrophils Relative %: 66 %
Platelet Count: 555 10*3/uL — ABNORMAL HIGH (ref 150–400)
RBC: 1.87 MIL/uL — ABNORMAL LOW (ref 3.87–5.11)
RDW: 21.3 % — ABNORMAL HIGH (ref 11.5–15.5)
WBC Count: 12.3 10*3/uL — ABNORMAL HIGH (ref 4.0–10.5)
nRBC: 0 % (ref 0.0–0.2)

## 2022-04-01 LAB — PREPARE RBC (CROSSMATCH)

## 2022-04-01 LAB — SAMPLE TO BLOOD BANK

## 2022-04-01 NOTE — Progress Notes (Signed)
[facility]  [phone] [phone]   Name: [name] Date: 04/01/2022 MRN: [id number]  DOB: 1941/01/30  Patient Care Team: Elaine Dy, MD as PCP - General (Internal Medicine) Elaine Genera, MD as Consulting Physician (Hematology)    INTERVAL HISTORY: [name] is a 81 y.o. 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 Palliative ask to see for symptom management and goals of care.     SOCIAL HISTORY:     reports that she quit smoking about 6 years ago. Her smoking use included cigarettes. She smoked an average of 1 pack per day. She has never used smokeless tobacco. She reports current alcohol use. She reports that she does not use drugs.  ADVANCE DIRECTIVES:  Patient does have a documented advanced directive. [name] (sister) is her primary HCPOA. She has a MOST form on file with [name] and sister will bring in at follow-up.   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  MEDICATIONS:  Current Outpatient Medications  Medication Sig Dispense Refill   acetaminophen (TYLENOL) 500 MG tablet Take 1 tablet (500 mg total) by mouth every 6 (six) hours  as needed for mild pain or moderate pain. 30 tablet 0   docusate sodium (COLACE) 100 MG capsule Take 1 capsule (100 mg total) by mouth 2 (two) times daily. 10 capsule 0   folic acid (FOLVITE) 1 MG tablet Take 2 tablets (2 mg total) by mouth daily.     Lorazepam POWD 1 mg by Does not apply route every 12 (twelve) hours as needed (agitation). 20 g 0   OXYGEN Inhale 2 L into the lungs See admin instructions. To maintain SAT greater than 90%     pantoprazole (PROTONIX) 40 MG tablet Take 1 tablet (40 mg total) by mouth daily at 6 (six) AM.     penicillin v potassium (VEETID) 500 MG tablet Take 1 tablet (500 mg total) by mouth 2 (two) times daily. 60 tablet 3   polyethylene glycol (MIRALAX / GLYCOLAX) 17 g packet Take 17 g by mouth daily.     QUEtiapine (SEROQUEL) 25 MG tablet Take 1 tablet (25 mg total) by mouth at bedtime. 20 tablet 0   risperiDONE (RISPERDAL) 0.5 MG tablet Take 1 tablet (0.5 mg total) by mouth 2 (two) times daily. 30 tablet 0   sertraline (ZOLOFT) 25 MG tablet Take 25 mg by mouth daily.     vitamin B-12 2000 MCG tablet Take 1 tablet (2,000 mcg total) by mouth daily. 30 tablet 2   No current facility-administered medications for this visit.    VITAL SIGNS: BP (!) 128/45 (BP Location: Left Arm, Patient Position: Sitting) Comment: Nurse notified  Pulse 68   Temp 97.6 F (36.4 C) (Oral)   Resp 16   SpO2 99%  There were no vitals filed for this visit.  Estimated body mass index is 14.7 kg/m as calculated from the following:   Height as of 03/18/22: '5\' 1"'$  (1.549 m).   Weight as of 02/04/22: 35.3 kg.   PERFORMANCE STATUS (ECOG) : 3 - Symptomatic, >50% confined to bed   Physical Exam General: NAD, in wheelchair  Cardiovascular: regular rate and rhythm Pulmonary: clear ant fields Abdomen: soft, nontender, + bowel sounds Extremities: no edema, no joint deformities Skin: no rashes Neurological: Weakness but otherwise nonfocal  IMPRESSION: Ms. [name] presents to the  clinic today with her sister, [name] for follow-up. No acute distress noted. She is in a wheelchair. Answers all questions appropriately.   [name] shares patient had a fall at facility several days ago. She was getting up out of the bed and slid down due to weakness. Denies any injuries.   Appetite continues be good with snacking throughout the day. She likes oatmeal cakes. Denies constipation, diarrhea, nausea, or vomiting.   Endorses occasional back pain and ongoing fatigue. Tylenol ES is effective for her discomfort. Her hemoglobin is 6.7 today. [name], RN notified patient and sister that she will receive 1uPRBC on tomorrow. Confirmed awareness of appointment time tomorrow.    No other symptom needs at this time.   Goals of Care   We discussed goals of care. Sister, [name] has copy of MOST form from facility. After reviewing documents updates were needed which they agreed to. Education provided on MOST form selections.   The patient and family outlined their wishes for the following treatment decisions:  Cardiopulmonary Resuscitation: Do Not Attempt Resuscitation (DNR/No CPR)  Medical Interventions: Limited Additional Interventions: Use medical treatment, IV fluids and cardiac monitoring as indicated, DO NOT USE intubation or mechanical ventilation. May consider use of less invasive airway support such as BiPAP or CPAP. Also provide comfort measures. Transfer to the hospital if indicated. Avoid intensive care.   Antibiotics: Antibiotics if indicated  IV Fluids: IV fluids if indicated  Feeding Tube: No feeding tube     03/18/22: Patient has a documented advanced directive.  Her Sister [name] is her primary Education officer, community.  Confirms DNR/DNI.  Education provided on MOLST form.  [name] reports she thinks patient has this document at the SNF and will verify.  If she does she will plan to bring in a copy at our follow-up for further discussions.  At this time expresses goals are clear to continue to  treat the treatable allowing patient every opportunity to thrive.  They would not wish for patient to be in a suffering state.  Encouraged further consideration with plans to discuss at follow-up regarding limitations in care specifically to frequent blood transfusions and rehospitalization.  I discussed the importance of continued conversation with family and their medical providers regarding overall plan of care and treatment options, ensuring decisions are within the context of the patients values and GOCs.  PLAN: 1uPRBC transfusion on tomorrow per Dr. Irene Limbo. Appointment has been confirmed. Sister has blood band bracelet.  Continue Tylenol ES for discomfort.  MOST form completed. Original given to sister, [name] and copy filed in chart.  Ongoing goals of care discussions and support. I will plan to see patient back in 3-4 weeks.    Patient expressed understanding and was in agreement with this plan. She also understands that She can call the clinic at any time with  any questions, concerns, or complaints.   Time Total: 45 min   Visit consisted of counseling and education dealing with the complex and emotionally intense issues of symptom management and palliative care in the setting of serious and potentially life-threatening illness.Greater than 50%  of this time was spent counseling and coordinating care related to the above assessment and plan.  Signed by: Alda Lea, AGPCNP-BC [facility]

## 2022-04-02 ENCOUNTER — Inpatient Hospital Stay: Payer: Medicare Other

## 2022-04-02 DIAGNOSIS — D5912 Cold autoimmune hemolytic anemia: Secondary | ICD-10-CM

## 2022-04-02 MED ORDER — METHYLPREDNISOLONE SODIUM SUCC 40 MG IJ SOLR
40.0000 mg | Freq: Once | INTRAMUSCULAR | Status: AC
  Administered 2022-04-02: 40 mg via INTRAVENOUS
  Filled 2022-04-02: qty 1

## 2022-04-02 MED ORDER — SODIUM CHLORIDE 0.9% IV SOLUTION
250.0000 mL | Freq: Once | INTRAVENOUS | Status: AC
  Administered 2022-04-02: 250 mL via INTRAVENOUS

## 2022-04-02 MED ORDER — ACETAMINOPHEN 325 MG PO TABS
650.0000 mg | ORAL_TABLET | Freq: Once | ORAL | Status: AC
  Administered 2022-04-02: 650 mg via ORAL
  Filled 2022-04-02: qty 2

## 2022-04-03 LAB — TYPE AND SCREEN
ABO/RH(D): B POS
Antibody Screen: NEGATIVE
Unit division: 0

## 2022-04-03 LAB — BPAM RBC
Blood Product Expiration Date: 202308012359
ISSUE DATE / TIME: 202307111027
Unit Type and Rh: 5100

## 2022-04-04 ENCOUNTER — Emergency Department (HOSPITAL_COMMUNITY): Payer: Medicare Other

## 2022-04-04 ENCOUNTER — Encounter (HOSPITAL_COMMUNITY): Payer: Self-pay | Admitting: Emergency Medicine

## 2022-04-04 ENCOUNTER — Other Ambulatory Visit: Payer: Self-pay

## 2022-04-04 ENCOUNTER — Inpatient Hospital Stay (HOSPITAL_COMMUNITY)
Admission: EM | Admit: 2022-04-04 | Discharge: 2022-04-23 | DRG: 951 | Disposition: E | Payer: Medicare Other | Source: Skilled Nursing Facility | Attending: Family Medicine | Admitting: Family Medicine

## 2022-04-04 DIAGNOSIS — E43 Unspecified severe protein-calorie malnutrition: Secondary | ICD-10-CM | POA: Diagnosis present

## 2022-04-04 DIAGNOSIS — I700 Atherosclerosis of aorta: Secondary | ICD-10-CM | POA: Diagnosis present

## 2022-04-04 DIAGNOSIS — F03B11 Unspecified dementia, moderate, with agitation: Secondary | ICD-10-CM | POA: Diagnosis not present

## 2022-04-04 DIAGNOSIS — Z515 Encounter for palliative care: Secondary | ICD-10-CM

## 2022-04-04 DIAGNOSIS — R64 Cachexia: Secondary | ICD-10-CM | POA: Diagnosis present

## 2022-04-04 DIAGNOSIS — N179 Acute kidney failure, unspecified: Secondary | ICD-10-CM | POA: Diagnosis present

## 2022-04-04 DIAGNOSIS — R6521 Severe sepsis with septic shock: Secondary | ICD-10-CM | POA: Diagnosis present

## 2022-04-04 DIAGNOSIS — R579 Shock, unspecified: Secondary | ICD-10-CM

## 2022-04-04 DIAGNOSIS — J189 Pneumonia, unspecified organism: Secondary | ICD-10-CM | POA: Diagnosis present

## 2022-04-04 DIAGNOSIS — J9610 Chronic respiratory failure, unspecified whether with hypoxia or hypercapnia: Secondary | ICD-10-CM | POA: Diagnosis present

## 2022-04-04 DIAGNOSIS — J440 Chronic obstructive pulmonary disease with acute lower respiratory infection: Secondary | ICD-10-CM | POA: Diagnosis present

## 2022-04-04 DIAGNOSIS — E538 Deficiency of other specified B group vitamins: Secondary | ICD-10-CM | POA: Diagnosis present

## 2022-04-04 DIAGNOSIS — Z9981 Dependence on supplemental oxygen: Secondary | ICD-10-CM | POA: Diagnosis not present

## 2022-04-04 DIAGNOSIS — Z79899 Other long term (current) drug therapy: Secondary | ICD-10-CM

## 2022-04-04 DIAGNOSIS — K7201 Acute and subacute hepatic failure with coma: Secondary | ICD-10-CM | POA: Diagnosis not present

## 2022-04-04 DIAGNOSIS — A419 Sepsis, unspecified organism: Secondary | ICD-10-CM | POA: Diagnosis not present

## 2022-04-04 DIAGNOSIS — E860 Dehydration: Secondary | ICD-10-CM | POA: Diagnosis present

## 2022-04-04 DIAGNOSIS — K7290 Hepatic failure, unspecified without coma: Secondary | ICD-10-CM

## 2022-04-04 DIAGNOSIS — F0390 Unspecified dementia without behavioral disturbance: Secondary | ICD-10-CM | POA: Diagnosis present

## 2022-04-04 DIAGNOSIS — Z20822 Contact with and (suspected) exposure to covid-19: Secondary | ICD-10-CM | POA: Diagnosis present

## 2022-04-04 DIAGNOSIS — Z90710 Acquired absence of both cervix and uterus: Secondary | ICD-10-CM

## 2022-04-04 DIAGNOSIS — G9341 Metabolic encephalopathy: Secondary | ICD-10-CM | POA: Diagnosis present

## 2022-04-04 DIAGNOSIS — D5912 Cold autoimmune hemolytic anemia: Secondary | ICD-10-CM | POA: Diagnosis present

## 2022-04-04 DIAGNOSIS — Z66 Do not resuscitate: Secondary | ICD-10-CM | POA: Diagnosis present

## 2022-04-04 DIAGNOSIS — Z961 Presence of intraocular lens: Secondary | ICD-10-CM | POA: Diagnosis present

## 2022-04-04 DIAGNOSIS — Z87891 Personal history of nicotine dependence: Secondary | ICD-10-CM

## 2022-04-04 DIAGNOSIS — A4151 Sepsis due to Escherichia coli [E. coli]: Principal | ICD-10-CM | POA: Diagnosis present

## 2022-04-04 DIAGNOSIS — R7401 Elevation of levels of liver transaminase levels: Secondary | ICD-10-CM | POA: Diagnosis present

## 2022-04-04 DIAGNOSIS — Z681 Body mass index (BMI) 19 or less, adult: Secondary | ICD-10-CM

## 2022-04-04 DIAGNOSIS — E872 Acidosis, unspecified: Secondary | ICD-10-CM | POA: Diagnosis present

## 2022-04-04 DIAGNOSIS — Z881 Allergy status to other antibiotic agents status: Secondary | ICD-10-CM

## 2022-04-04 DIAGNOSIS — Z888 Allergy status to other drugs, medicaments and biological substances status: Secondary | ICD-10-CM

## 2022-04-04 DIAGNOSIS — F0394 Unspecified dementia, unspecified severity, with anxiety: Secondary | ICD-10-CM | POA: Diagnosis present

## 2022-04-04 DIAGNOSIS — Z91013 Allergy to seafood: Secondary | ICD-10-CM

## 2022-04-04 LAB — CBC WITH DIFFERENTIAL/PLATELET
Abs Immature Granulocytes: 1.08 10*3/uL — ABNORMAL HIGH (ref 0.00–0.07)
Basophils Absolute: 0 10*3/uL (ref 0.0–0.1)
Basophils Relative: 0 %
Eosinophils Absolute: 0 10*3/uL (ref 0.0–0.5)
Eosinophils Relative: 0 %
HCT: 10.1 % — ABNORMAL LOW (ref 36.0–46.0)
Hemoglobin: 3.3 g/dL — CL (ref 12.0–15.0)
Immature Granulocytes: 8 %
Lymphocytes Relative: 11 %
Lymphs Abs: 1.4 10*3/uL (ref 0.7–4.0)
MCH: 29.6 pg (ref 26.0–34.0)
MCHC: 31.7 g/dL (ref 30.0–36.0)
MCV: 93.5 fL (ref 80.0–100.0)
Monocytes Absolute: 1.2 10*3/uL — ABNORMAL HIGH (ref 0.1–1.0)
Monocytes Relative: 9 %
Neutro Abs: 9.2 10*3/uL — ABNORMAL HIGH (ref 1.7–7.7)
Neutrophils Relative %: 72 %
Platelets: 386 10*3/uL (ref 150–400)
RBC: 1.08 MIL/uL — ABNORMAL LOW (ref 3.87–5.11)
RDW: 18.8 % — ABNORMAL HIGH (ref 11.5–15.5)
WBC: 12.8 10*3/uL — ABNORMAL HIGH (ref 4.0–10.5)
nRBC: 0.5 % — ABNORMAL HIGH (ref 0.0–0.2)

## 2022-04-04 LAB — COMPREHENSIVE METABOLIC PANEL
ALT: 947 U/L — ABNORMAL HIGH (ref 0–44)
AST: 1619 U/L — ABNORMAL HIGH (ref 15–41)
Albumin: 3.1 g/dL — ABNORMAL LOW (ref 3.5–5.0)
Alkaline Phosphatase: 74 U/L (ref 38–126)
Anion gap: 13 (ref 5–15)
BUN: 68 mg/dL — ABNORMAL HIGH (ref 8–23)
CO2: 24 mmol/L (ref 22–32)
Calcium: 7.8 mg/dL — ABNORMAL LOW (ref 8.9–10.3)
Chloride: 98 mmol/L (ref 98–111)
Creatinine, Ser: 1.4 mg/dL — ABNORMAL HIGH (ref 0.44–1.00)
GFR, Estimated: 38 mL/min — ABNORMAL LOW (ref >60)
Glucose, Bld: 151 mg/dL — ABNORMAL HIGH (ref 70–99)
Potassium: 3.5 mmol/L (ref 3.5–5.1)
Sodium: 135 mmol/L (ref 135–145)
Total Bilirubin: 4.5 mg/dL — ABNORMAL HIGH (ref 0.3–1.2)
Total Protein: 5.8 g/dL — ABNORMAL LOW (ref 6.5–8.1)

## 2022-04-04 LAB — PREPARE RBC (CROSSMATCH)

## 2022-04-04 LAB — MRSA NEXT GEN BY PCR, NASAL: MRSA by PCR Next Gen: NOT DETECTED

## 2022-04-04 LAB — POC OCCULT BLOOD, ED: Fecal Occult Bld: NEGATIVE

## 2022-04-04 LAB — SARS CORONAVIRUS 2 BY RT PCR: SARS Coronavirus 2 by RT PCR: NEGATIVE

## 2022-04-04 LAB — LACTIC ACID, PLASMA: Lactic Acid, Venous: 3.1 mmol/L (ref 0.5–1.9)

## 2022-04-04 LAB — AMMONIA: Ammonia: 39 umol/L — ABNORMAL HIGH (ref 9–35)

## 2022-04-04 MED ORDER — ACETAMINOPHEN 325 MG PO TABS: 650.0000 mg | ORAL_TABLET | Freq: Four times a day (QID) | ORAL | Status: DC | PRN

## 2022-04-04 MED ORDER — METRONIDAZOLE 500 MG/100ML IV SOLN: 500.0000 mg | Freq: Once | INTRAVENOUS | Status: DC

## 2022-04-04 MED ORDER — SODIUM CHLORIDE 0.9 % IV SOLN
2.0000 g | Freq: Once | INTRAVENOUS | Status: AC
  Administered 2022-04-04: 2 g via INTRAVENOUS
  Filled 2022-04-04: qty 12.5

## 2022-04-04 MED ORDER — GLYCOPYRROLATE 1 MG PO TABS: 1.0000 mg | ORAL_TABLET | ORAL | Status: DC | PRN

## 2022-04-04 MED ORDER — POLYVINYL ALCOHOL 1.4 % OP SOLN: 1.0000 [drp] | Freq: Four times a day (QID) | OPHTHALMIC | Status: DC | PRN

## 2022-04-04 MED ORDER — LACTATED RINGERS IV SOLN: INTRAVENOUS | Status: DC

## 2022-04-04 MED ORDER — VANCOMYCIN HCL 750 MG/150ML IV SOLN
750.0000 mg | Freq: Once | INTRAVENOUS | Status: DC
  Filled 2022-04-04: qty 150

## 2022-04-04 MED ORDER — ACETAMINOPHEN 650 MG RE SUPP
650.0000 mg | Freq: Four times a day (QID) | RECTAL | Status: DC | PRN
  Administered 2022-04-07 (×3): 650 mg via RECTAL
  Filled 2022-04-04 (×3): qty 1

## 2022-04-04 MED ORDER — ONDANSETRON HCL 4 MG/2ML IJ SOLN: 4.0000 mg | Freq: Four times a day (QID) | INTRAMUSCULAR | Status: DC | PRN

## 2022-04-04 MED ORDER — LACTATED RINGERS IV BOLUS
1000.0000 mL | Freq: Once | INTRAVENOUS | Status: DC
Start: 2022-04-04 — End: 2022-04-04

## 2022-04-04 MED ORDER — LACTATED RINGERS IV BOLUS (SEPSIS): 250.0000 mL | Freq: Once | INTRAVENOUS | Status: DC

## 2022-04-04 MED ORDER — BIOTENE DRY MOUTH MT LIQD: 15.0000 mL | OROMUCOSAL | Status: DC | PRN

## 2022-04-04 MED ORDER — SODIUM CHLORIDE 0.9% IV SOLUTION: Freq: Once | INTRAVENOUS | Status: DC

## 2022-04-04 MED ORDER — MORPHINE SULFATE (PF) 2 MG/ML IV SOLN
1.0000 mg | INTRAVENOUS | Status: DC | PRN
  Administered 2022-04-04 – 2022-04-07 (×4): 1 mg via INTRAVENOUS
  Filled 2022-04-04 (×4): qty 1

## 2022-04-04 MED ORDER — ONDANSETRON 4 MG PO TBDP: 4.0000 mg | ORAL_TABLET | Freq: Four times a day (QID) | ORAL | Status: DC | PRN

## 2022-04-04 MED ORDER — LORAZEPAM 1 MG PO TABS: 1.0000 mg | ORAL_TABLET | ORAL | Status: DC | PRN

## 2022-04-04 MED ORDER — SODIUM CHLORIDE 0.9 % IV SOLN: 10.0000 mL/h | Freq: Once | INTRAVENOUS | Status: DC

## 2022-04-04 MED ORDER — GLYCOPYRROLATE 0.2 MG/ML IJ SOLN
0.2000 mg | INTRAMUSCULAR | Status: DC | PRN
  Administered 2022-04-06: 0.2 mg via INTRAVENOUS
  Filled 2022-04-04: qty 1

## 2022-04-04 MED ORDER — LORAZEPAM 2 MG/ML PO CONC: 1.0000 mg | ORAL | Status: DC | PRN

## 2022-04-04 MED ORDER — GLYCOPYRROLATE 0.2 MG/ML IJ SOLN: 0.2000 mg | INTRAMUSCULAR | Status: DC | PRN

## 2022-04-04 MED ORDER — VANCOMYCIN HCL IN DEXTROSE 1-5 GM/200ML-% IV SOLN: 1000.0000 mg | Freq: Once | INTRAVENOUS | Status: DC

## 2022-04-04 MED ORDER — LACTATED RINGERS IV BOLUS (SEPSIS)
1000.0000 mL | Freq: Once | INTRAVENOUS | Status: AC
  Administered 2022-04-04: 1000 mL via INTRAVENOUS

## 2022-04-04 MED ORDER — LORAZEPAM 2 MG/ML IJ SOLN
1.0000 mg | INTRAMUSCULAR | Status: DC | PRN
  Administered 2022-04-04 – 2022-04-07 (×4): 1 mg via INTRAVENOUS
  Filled 2022-04-04 (×5): qty 1

## 2022-04-04 MED ORDER — LACTATED RINGERS IV BOLUS
1000.0000 mL | Freq: Once | INTRAVENOUS | Status: AC
  Administered 2022-04-04: 1000 mL via INTRAVENOUS

## 2022-04-04 NOTE — ED Provider Notes (Signed)
[facility] DEPT Provider Note   CSN: [id number] Arrival date & time: 04/03/2022  1247     History  Chief Complaint  Patient presents with   Hypotension    [name] is a 81 y.o. female.  81 year old female presents due to low blood pressure from nursing home.  Patient does have a history of dementia but is normally conversational.  Spoke with patient's sister at length about her condition.  She does have a history of hemolytic anemia and had a blood transfusion earlier this week.  She has no prior history of biliary disease as far as assessment notes.  She has had some cough and congestion.  She had temperature yesterday of 100.7 and there was concern for possible respiratory infection.  According to the sister, she has not had any emesis or dark stools.  Was brought here today for further management.  No therapy started yesterday for this       Home Medications Prior to Admission medications   Medication Sig Start Date End Date Taking? Authorizing Provider  acetaminophen (TYLENOL) 500 MG tablet Take 1 tablet (500 mg total) by mouth every 6 (six) hours as needed for mild pain or moderate pain. 03/18/22   Pickenpack-Cousar, Carlena Sax, NP  docusate sodium (COLACE) 100 MG capsule Take 1 capsule (100 mg total) by mouth 2 (two) times daily. 10/09/21   [name], [name], MD  folic acid (FOLVITE) 1 MG tablet Take 2 tablets (2 mg total) by mouth daily. 10/10/21   [name], [name], MD  Lorazepam POWD 1 mg by Does not apply route every 12 (twelve) hours as needed (agitation). 02/05/22   Nita Sells, MD  OXYGEN Inhale 2 L into the lungs See admin instructions. To maintain SAT greater than 90%    [provider]  pantoprazole (PROTONIX) 40 MG tablet Take 1 tablet (40 mg total) by mouth daily at 6 (six) AM. 10/10/21   [name], Ripudeep K, MD  penicillin v potassium (VEETID) 500 MG tablet Take 1 tablet (500 mg total) by mouth 2 (two) times daily. 02/14/22    Brunetta Genera, MD  polyethylene glycol (MIRALAX / GLYCOLAX) 17 g packet Take 17 g by mouth daily.    [provider]  QUEtiapine (SEROQUEL) 25 MG tablet Take 1 tablet (25 mg total) by mouth at bedtime. 02/05/22   Nita Sells, MD  risperiDONE (RISPERDAL) 0.5 MG tablet Take 1 tablet (0.5 mg total) by mouth 2 (two) times daily. 02/05/22   Nita Sells, MD  sertraline (ZOLOFT) 25 MG tablet Take 25 mg by mouth daily. 10/23/21   [provider]  vitamin B-12 2000 MCG tablet Take 1 tablet (2,000 mcg total) by mouth daily. 08/21/21   [name], MD      Allergies    Other and Shellfish allergy    Review of Systems   Review of Systems  Unable to perform ROS: Mental status change    Physical Exam Updated Vital Signs BP (!) 70/36   Pulse 91   Temp 98.8 F (37.1 C) (Rectal)   SpO2 93%  Physical Exam Vitals and nursing note reviewed.  Constitutional:      General: She is not in acute distress.    Appearance: Normal appearance. She is well-developed. She is not toxic-appearing.  HENT:     Head: Normocephalic and atraumatic.  Eyes:     General: Lids are normal.     Conjunctiva/sclera: Conjunctivae normal.     Pupils: Pupils are  equal, round, and reactive to light.     Comments: Scleral icterus noted  Neck:     Thyroid: No thyroid mass.     Trachea: No tracheal deviation.  Cardiovascular:     Rate and Rhythm: Normal rate and regular rhythm.     Heart sounds: Normal heart sounds. No murmur heard.    No gallop.  Pulmonary:     Effort: Pulmonary effort is normal. No respiratory distress.     Breath sounds: Normal breath sounds. No stridor. No decreased breath sounds, wheezing, rhonchi or rales.  Abdominal:     General: There is no distension.     Palpations: Abdomen is soft.     Tenderness: There is no abdominal tenderness. There is no rebound.  Musculoskeletal:        General: No tenderness. Normal range of motion.     Cervical back:  Normal range of motion and neck supple.  Skin:    General: Skin is warm and dry.     Coloration: Skin is jaundiced.     Findings: No abrasion or rash.  Neurological:     Mental Status: She is lethargic, disoriented and confused.     GCS: GCS eye subscore is 4. GCS verbal subscore is 4. GCS motor subscore is 4.     Cranial Nerves: Cranial nerves 2-12 are intact. No cranial nerve deficit.     Sensory: No sensory deficit.     Motor: No tremor.  Psychiatric:        Attention and Perception: She is inattentive.        Mood and Affect: Affect is blunt.     ED Results / Procedures / Treatments   Labs (all labs ordered are listed, but only abnormal results are displayed) Labs Reviewed  CULTURE, BLOOD (ROUTINE X 2)  CULTURE, BLOOD (ROUTINE X 2)  SARS CORONAVIRUS 2 BY RT PCR  CBC WITH DIFFERENTIAL/PLATELET  COMPREHENSIVE METABOLIC PANEL  LACTIC ACID, PLASMA  LACTIC ACID, PLASMA  AMMONIA  URINALYSIS, ROUTINE W REFLEX MICROSCOPIC    EKG None  Radiology No results found.  Procedures Procedures    Medications Ordered in ED Medications  lactated ringers infusion (has no administration in time range)  lactated ringers bolus 1,000 mL (1,000 mLs Intravenous New Bag/Given 04/22/2022 1314)    ED Course/ Medical Decision Making/ A&P                           Medical Decision Making Amount and/or Complexity of Data Reviewed Labs: ordered. Radiology: ordered.  Risk Prescription drug management. Decision regarding hospitalization.  Patient's chest x-ray per interpretation shows no evidence of heart failure.  She is COVID-negative here.  Mild elevation of her ammonia level.  Has evidence of mild acute kidney injury as well as some dehydration with BUN/creatinine being elevated. Patient presented with hypotension.  Concern for possible sepsis etiology versus worsening hemolytic anemia.  Patient started on sepsis protocol with 30/kg bolus of lactated Ringer's.  She however is afebrile  here.  She was found to have a hemoglobin of 3.3.  Was given 1 unit of O- blood here because she was hypotensive and unsure if this is from her hemolytic anemia.  Had long discussion with patient's daughter about patient's critical illness and told her that she needs to get her as soon as possible.  Patient also has significant transaminitis as well as elevated bili which goes along with her hemolytic anemia. Patient is critically ill  at this time.  Discussed with hospitalist will admit patient   CRITICAL CARE Performed by: Leota Jacobsen Total critical care time: 70 minutes Critical care time was exclusive of separately billable procedures and treating other patients. Critical care was necessary to treat or prevent imminent or life-threatening deterioration. Critical care was time spent personally by me on the following activities: development of treatment plan with patient and/or surrogate as well as nursing, discussions with consultants, evaluation of patient's response to treatment, examination of patient, obtaining history from patient or surrogate, ordering and performing treatments and interventions, ordering and review of laboratory studies, ordering and review of radiographic studies, pulse oximetry and re-evaluation of patient's condition.         Final Clinical Impression(s) / ED Diagnoses Final diagnoses:  None    Rx / DC Orders ED Discharge Orders     None         Lacretia Leigh, MD 03/23/2022 1442

## 2022-04-04 NOTE — Progress Notes (Signed)
A consult was received from an ED physician for cefepime and vancomycin per pharmacy dosing.  The patient's profile has been reviewed for ht/wt/allergies/indication/available labs.    A one time order has been placed for vancomycin 750 mg.  Agree with order already entered for cefepime 2 g IV.  Further antibiotics/pharmacy consults should be ordered by admitting physician if indicated.                       Thank you, Suzzanne Cloud, PharmD, BCPS 04/13/2022  2:48 PM

## 2022-04-04 NOTE — Hospital Course (Addendum)
[name] is an 81 y.o. F with autoimmune hemolytic anemia cold agglutinin disease, transfusion dependent q2weeks, baseline Hgb 7 g/dL, COPD and chronic respiratory failure on 2L, dementia, SNF-resident who presented with encephalopathy and hypotension.  Patient unable to provide report.  Family report she had had respiratory symptoms this week, yesterday fever to 100.75F.  Had had her last transfusion 3 days ago per routine.    This morning, family arrived and found her unresponsive, lethargic.  BP was 90/60 mmHg and so she was transported to the ER.  In the ER blood pressure was 70/36, tachycardic to 91, acidosis.  Lactate 3.1.  Chest x-ray showed right base opacity.  Creatinine was tripled from baseline, LFTs were greater than thousand, hemoglobin (transportd without warming) was 3.3 g/dL.    Goals of care were discussed with patient's two sisters, her closest living family.

## 2022-04-04 NOTE — ED Triage Notes (Signed)
Patient presents from [facility] due to hypotension. The facility found her BP in the 80's/ 50's. EMS administered 100 ml of fluid.     HX: Cancer (last treatment on Tuesday), Dementia, 2 L O2   EMS vitals: 94/44 BP 90 HR 18 RR 211 CBG 99% 2L O2 nasal canula

## 2022-04-04 NOTE — H&P (Signed)
History and Physical    Patient: [name] [id number] DOB: Apr 02, 1941 DOA: 03/25/2022 DOS: the patient was seen and examined on 03/26/2022 PCP: [name], MD  Patient coming from: [facility]  Chief Complaint:  Chief Complaint  Patient presents with   Hypotension       HPI:  [name] is an 81 y.o. F with autoimmune hemolytic anemia cold agglutinin disease, transfusion dependent q2weeks, baseline Hgb 7 g/dL, COPD and chronic respiratory failure on 2L, dementia, SNF-resident who presented with encephalopathy and hypotension.  Patient unable to provide report.  Family report she had had respiratory symptoms this week, yesterday fever to 100.15F.  Had had her last transfusion 3 days ago per routine.    This morning, family arrived and found her unresponsive, lethargic.  BP was 90/60 mmHg and so she was transported to the ER.  In the ER blood pressure was 70/36, tachycardic to 91, acidosis.  Lactate 3.1.  Chest x-ray showed right base opacity.  Creatinine was tripled from baseline, LFTs were greater than thousand, hemoglobin (transportd without warming) was 3.3 g/dL.    Goals of care were discussed with patient's two sisters, her closest living family.        Review of Systems  Unable to perform ROS: Critical illness  Constitutional:  Positive for fever.  Respiratory:  Positive for cough.      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[place])    montiored by dr Marko Plume   Intraventricular hemorrhage ([place]) 10/03/2021   Renal and ureteric calculus    left   Past Surgical History:  Procedure  Laterality Date   ABDOMINAL HYSTERECTOMY  1986   APPENDECTOMY  1967   BREAST EXCISIONAL BIOPSY Right 1984   CATARACT EXTRACTION W/ INTRAOCULAR LENS IMPLANT Right 1990's   congential cataract   CYSTOSCOPY W/ URETERAL STENT PLACEMENT  11/12/2015   Procedure: CYSTOSCOPY WITH RETROGRADE PYELOGRAM/URETERAL STENT PLACEMENT;  Surgeon: [name], MD;  Location: [facility];  Service: Urology;;   CYSTOSCOPY W/ URETERAL STENT REMOVAL Left 11/22/2015   Procedure: CYSTOSCOPY WITH STENT REMOVAL;  Surgeon: [name], MD;  Location: [facility];  Service: Urology;  Laterality: Left;   CYSTOSCOPY/URETEROSCOPY/HOLMIUM LASER Left 11/22/2015   Procedure: CYSTOSCOPY/URETEROSCOPY/HOLMIUM LASER;  Surgeon: [name], MD;  Location: [facility];  Service: Urology;  Laterality: Left;  FLEX URETEROSCOPY   FEMUR IM NAIL Left 08/04/2015   Procedure: INTRAMEDULLARY (IM) NAIL FEMORAL;  Surgeon: [name], MD;  Location: [place];  Service: Orthopedics;  Laterality: Left;   INTRAMEDULLARY (IM) NAIL INTERTROCHANTERIC Right 10/04/2021   Procedure: INTRAMEDULLARY (IM) NAIL INTERTROCHANTRIC;  Surgeon: [name], MD;  Location: [place];  Service: Orthopedics;  Laterality: Right;   IR KYPHO THORACIC WITH BONE BIOPSY  06/22/2018   TONSILLECTOMY  age 72   Social History:  reports that she quit smoking about 6 years ago. Her smoking use included cigarettes. She smoked an average of 1 pack per day. She has never used smokeless tobacco. She reports current alcohol use. She reports that she does not use drugs.  Allergies  Allergen Reactions   Other Other (See Comments)    "Most antibiotics cause yeast infections"   Shellfish Allergy Hives    Family History  Problem Relation Age of Onset   Breast cancer Cousin     Prior to Admission medications   Medication Sig Start Date End Date Taking? Authorizing Provider  acetaminophen (TYLENOL) 500 MG tablet Take 1 tablet (500 mg total) by mouth every 6  (six) hours as needed for mild pain or moderate pain. 03/18/22   Pickenpack-Cousar, Carlena Sax, NP  docusate sodium (COLACE) 100 MG capsule Take 1 capsule (100 mg total) by mouth 2 (two) times daily. 10/09/21   [name], [name], MD  folic acid (FOLVITE) 1 MG tablet Take 2 tablets (2 mg total) by mouth daily. 10/10/21   [name], [name], MD  Lorazepam POWD 1 mg by Does not apply route every 12 (twelve) hours as needed (agitation). 02/05/22   Nita Sells, MD  OXYGEN Inhale 2 L into the lungs See admin instructions. To maintain SAT greater than 90%    [provider]  pantoprazole (PROTONIX) 40 MG tablet Take 1 tablet (40 mg total) by mouth daily at 6 (six) AM. 10/10/21   [name], Ripudeep K, MD  penicillin v potassium (VEETID) 500 MG tablet Take 1 tablet (500 mg total) by mouth 2 (two) times daily. 02/14/22   Brunetta Genera, MD  polyethylene glycol (MIRALAX / GLYCOLAX) 17 g packet Take 17 g by mouth daily.    [provider]  QUEtiapine (SEROQUEL) 25 MG tablet Take 1 tablet (25 mg total) by mouth at bedtime. 02/05/22   Nita Sells, MD  risperiDONE (RISPERDAL) 0.5 MG tablet Take 1 tablet (0.5 mg total) by mouth 2 (two) times daily. 02/05/22   Nita Sells, MD  sertraline (ZOLOFT) 25 MG tablet Take 25 mg by mouth daily. 10/23/21   [provider]  vitamin B-12 2000 MCG tablet Take 1 tablet (2,000 mcg total) by mouth daily. 08/21/21   [name], MD    Physical Exam: Vitals:   04/02/2022 1430 04/22/2022 1435 04/21/2022 1445 04/19/2022 1453  BP: (!) 77/32 (!) 76/30 (!) 72/30 (!) 78/38  Pulse:  85  84  Resp: '16 16 15   '$ Temp:  98.4 F (36.9 C)  97.8 F (36.6 C)  TempSrc:  Rectal  Rectal  SpO2:  100%  100%   Cachectic elderly female, in fetal position lying on her right side, appears jaundiced and lethargic, moaning occasionally Heart rate elevated, heart sounds distant and weak, distal pulses fast and thready, peripheral extremities cool, no mottling of the  skin Respiratory rate shallow, crackles in the right base, no wheezing Abdomen without grimace to palpation, no masses appreciated, no ascites or distention Diffuse severe loss of subcutaneous muscle mass and fat Unresponsive, stirs sluggishly, pupils equal does not follow commands, no spontaneous verbalizations or spontaneous intentional movement      Data Reviewed: Basic metabolic panel shows creatinine 1.4 from baseline 0.4, sodium and potassium normal Hepatic panel shows AST 1619, ALT 947, total bilirubin 4.7 Hemogram shows leukocytosis 12.8, hemoglobin 3.3 Lactic acid 3.1 COVID test negative Chest x-ray shows kyphosis, possibly right base opacity    Assessment and Plan: * Shock due to hemolytic anemia  Liver failure (HCC)  Acute metabolic encephalopathy  Acute renal failure (ARF) (HCC)  Likely septic shock  Dementia (HCC)  CAP (community acquired pneumonia)  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  Regardless of the cause, she has encephalopathy, renal failure and liver failure, and there is high certainty that this is a fatal illness.  I discussed goals of care with family who would not want escalation of care to include vasopressors, intubation, life support, invasive procedures.    Family understand that she is likely in the active dying process, and wished for her to remain comfortable. - Comfort measures - We will treat any signs of pain or suffering - Expected imminent death        Advance Care Planning: DNR, comfort measures    Family Communication: Sister at the bedside, other sister by phone, prognosis and CODE STATUS confirmed  Severity of Illness: The appropriate patient status for this patient is INPATIENT. Inpatient status is judged to  be reasonable and necessary in order to provide the required intensity of service to ensure the patient's safety. The patient's presenting symptoms, physical exam findings, and initial radiographic and laboratory data in the context of their chronic comorbidities is felt to place them at high risk for further clinical deterioration. Furthermore, it is not anticipated that the patient will be medically stable for discharge from the hospital within 2 midnights of admission.   * I certify that at the point of admission it is my clinical judgment that the patient will require inpatient hospital care spanning beyond 2 midnights from the point of admission due to high intensity of service, high risk for further deterioration and high frequency of surveillance required.*  Author: Edwin Dada, MD 03/30/2022 3:26 PM  For on call review [email or web address].

## 2022-04-04 NOTE — Sepsis Progress Note (Signed)
Sepsis protocol monitored by eLink 

## 2022-04-05 DIAGNOSIS — N179 Acute kidney failure, unspecified: Secondary | ICD-10-CM | POA: Diagnosis not present

## 2022-04-05 DIAGNOSIS — F03B11 Unspecified dementia, moderate, with agitation: Secondary | ICD-10-CM | POA: Diagnosis not present

## 2022-04-05 DIAGNOSIS — G9341 Metabolic encephalopathy: Secondary | ICD-10-CM | POA: Diagnosis not present

## 2022-04-05 DIAGNOSIS — A419 Sepsis, unspecified organism: Secondary | ICD-10-CM | POA: Diagnosis not present

## 2022-04-05 LAB — BLOOD CULTURE ID PANEL (REFLEXED) - BCID2

## 2022-04-05 LAB — TYPE AND SCREEN
ABO/RH(D): B POS
Antibody Screen: NEGATIVE
Unit division: 0

## 2022-04-05 LAB — BPAM RBC
Blood Product Expiration Date: 202308122359
ISSUE DATE / TIME: 202307131423
Unit Type and Rh: 9500

## 2022-04-05 MED ORDER — MORPHINE SULFATE (PF) 2 MG/ML IV SOLN
1.0000 mg | Freq: Once | INTRAVENOUS | Status: AC
  Administered 2022-04-05: 1 mg via INTRAVENOUS
  Filled 2022-04-05: qty 1

## 2022-04-05 NOTE — Progress Notes (Signed)
PHARMACY - PHYSICIAN COMMUNICATION CRITICAL VALUE ALERT - BLOOD CULTURE IDENTIFICATION (BCID)  Tylene V Brittingham is an 81 y.o. female who presented to [facility] on 03/24/2022 with a chief complaint of hypotension and encephalopathy.   Assessment:  2/4 blood cultures growing Gram negative rods (aerobic bottle only).  BCID detects Ecoli with no resistance.  Name of physician (or Provider) Contacted: Dr. Loleta Books  Current antibiotics: N/A - antibiotics were discontinued on 7/13 as patient transitioned to full comfort care.  Changes to prescribed antibiotics recommended:  Discussed with MD - no plans to resume antibiotics as patient has transitioned to full comfort care measures and is likely nearing end-of-life.  Results for orders placed or performed during the hospital encounter of 04/13/2022  Blood Culture ID Panel (Reflexed) (Collected: 04/14/2022  1:24 PM)  Result Value Ref Range   Enterococcus faecalis NOT DETECTED NOT DETECTED   Enterococcus Faecium NOT DETECTED NOT DETECTED   Listeria monocytogenes NOT DETECTED NOT DETECTED   Staphylococcus species NOT DETECTED NOT DETECTED   Staphylococcus aureus (BCID) NOT DETECTED NOT DETECTED   Staphylococcus epidermidis NOT DETECTED NOT DETECTED   Staphylococcus lugdunensis NOT DETECTED NOT DETECTED   Streptococcus species NOT DETECTED NOT DETECTED   Streptococcus agalactiae NOT DETECTED NOT DETECTED   Streptococcus pneumoniae NOT DETECTED NOT DETECTED   Streptococcus pyogenes NOT DETECTED NOT DETECTED   A.calcoaceticus-baumannii NOT DETECTED NOT DETECTED   Bacteroides fragilis NOT DETECTED NOT DETECTED   Enterobacterales DETECTED (A) NOT DETECTED   Enterobacter cloacae complex NOT DETECTED NOT DETECTED   Escherichia coli DETECTED (A) NOT DETECTED   Klebsiella aerogenes NOT DETECTED NOT DETECTED   Klebsiella oxytoca NOT DETECTED NOT DETECTED   Klebsiella pneumoniae NOT DETECTED NOT DETECTED   Proteus species NOT DETECTED NOT DETECTED    Salmonella species NOT DETECTED NOT DETECTED   Serratia marcescens NOT DETECTED NOT DETECTED   Haemophilus influenzae NOT DETECTED NOT DETECTED   Neisseria meningitidis NOT DETECTED NOT DETECTED   Pseudomonas aeruginosa NOT DETECTED NOT DETECTED   Stenotrophomonas maltophilia NOT DETECTED NOT DETECTED   Candida albicans NOT DETECTED NOT DETECTED   Candida auris NOT DETECTED NOT DETECTED   Candida glabrata NOT DETECTED NOT DETECTED   Candida krusei NOT DETECTED NOT DETECTED   Candida parapsilosis NOT DETECTED NOT DETECTED   Candida tropicalis NOT DETECTED NOT DETECTED   Cryptococcus neoformans/gattii NOT DETECTED NOT DETECTED   CTX-M ESBL NOT DETECTED NOT DETECTED   Carbapenem resistance IMP NOT DETECTED NOT DETECTED   Carbapenem resistance KPC NOT DETECTED NOT DETECTED   Carbapenem resistance NDM NOT DETECTED NOT DETECTED   Carbapenem resist OXA 48 LIKE NOT DETECTED NOT DETECTED   Carbapenem resistance VIM NOT DETECTED NOT DETECTED    Dimple Nanas, PharmD, BCPS 04/05/2022 9:35 AM

## 2022-04-05 NOTE — Progress Notes (Signed)
  Progress Note   Patient: [name] GXQ:[id number] DOB: [birth year] DOA: 04/09/2022     1 DOS: the patient was seen and examined on 04/05/2022 10:12AM and 1:00PM      Brief hospital course: Mrs. Gillies is an 81 y.o. F with autoimmune hemolytic anemia cold agglutinin disease, transfusion dependent q2weeks, baseline Hgb 7 g/dL, COPD and chronic respiratory failure on 2L, dementia, SNF-resident who presented with encephalopathy and hypotension.  In the ER, the patient was found to have multiorgan failure, hypotension.  Goals of care were discussed with family, and the patient was made comfort care.  Overnight blood cultures grew E. coli.    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  She is not able to take anything by mouth.  I suspect that she has hours to days to live. - May have comfort feeds from floor stock - Family have articulated the patient's wishes would be to allow a natural death - Ativan for anxiety, morphine for pain          Subjective: Patient is unresponsive.  She has been restless at times overnight, but mostly quiet and appeared comfortable.     Physical Exam: Vitals:   04/18/2022 1515 04/21/2022 1530 04/03/2022 1545 03/23/2022 1700  BP: (!) 84/42 (!) 89/39 (!) 88/40   Pulse: 86 86 86   Resp: 19 (!) 31 17   Temp:   99 F (37.2 C)   TempSrc:   Rectal   SpO2: 100% 100% 100%   Weight:    35.3 kg  Height:    5' 0.98" (1.549 m)   Cachectic elderly female, jaundiced, curled in fetal position Breath racket, lung sounds diminished, no rales or wheezes appreciated Pulses thready, fast, systolic murmur noted, no peripheral  edema Abdomen without guarding or grimace Unresponsive, does not follow commands, groans occasionally, stares occasionally without purposeful movement, does not make any spontaneous verbalizations      Family Communication: Sisters and mother at bedisde    Disposition: Status is: Inpatient Expect in hospital death.  If stable overnight, will recommend [facility] place        Author: Edwin Dada, MD 04/05/2022 1:36 PM  For on call review [email or web address].

## 2022-04-06 DIAGNOSIS — G9341 Metabolic encephalopathy: Secondary | ICD-10-CM | POA: Diagnosis not present

## 2022-04-06 DIAGNOSIS — F03B11 Unspecified dementia, moderate, with agitation: Secondary | ICD-10-CM | POA: Diagnosis not present

## 2022-04-06 DIAGNOSIS — A419 Sepsis, unspecified organism: Secondary | ICD-10-CM | POA: Diagnosis not present

## 2022-04-06 DIAGNOSIS — N179 Acute kidney failure, unspecified: Secondary | ICD-10-CM | POA: Diagnosis not present

## 2022-04-06 NOTE — Plan of Care (Signed)

## 2022-04-06 NOTE — Progress Notes (Signed)
  Progress Note   Patient: [name] [id number] DOB: 10-06-1940 DOA: 04/12/2022     2 DOS: the patient was seen and examined on 04/06/2022       Brief hospital course: [name] is an 81 y.o. F with autoimmune hemolytic anemia cold agglutinin disease, transfusion dependent q2weeks, baseline Hgb 7 g/dL, COPD and chronic respiratory failure on 2L, dementia, SNF-resident who presented with encephalopathy and hypotension.  In the ER, the patient was found to have multiorgan failure, hypotension.  Goals of care were discussed with family, and the patient was made comfort care.  Overnight blood cultures grew E. coli.    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  Family reports she seems agitated prior to being. - Allow a natural death, family have declined residential hospice         Subjective: Patient remains unresponsive.  She becomes restless with having to urinate, but otherwise is sleeping       Physical Exam: Vitals:   03/31/2022 1515 04/18/2022 1530 04/18/2022 1545 04/14/2022 1700  BP: (!) 84/42 (!) 89/39 (!) 88/40   Pulse: 86 86 86   Resp: 19 (!) 31 17   Temp:   99 F (37.2 C)   TempSrc:   Rectal   SpO2: 100% 100% 100%   Weight:    35.3 kg  Height:    5' 0.98" (1.549 m)   Cachectic elderly female, curled in fetal position, jaundice Heart sounds rapid, faint, I do not appreciate murmurs, she has no peripheral edema Respiratory rate is increased, weak, no rales appreciated      Family Communication: Sister and mother at bedisde    Disposition: Status  is: Inpatient Expect in hospital death.          Author: Edwin Dada, MD 04/06/2022 2:41 PM  For on call review [email or web address].

## 2022-04-07 DIAGNOSIS — N179 Acute kidney failure, unspecified: Secondary | ICD-10-CM | POA: Diagnosis not present

## 2022-04-07 DIAGNOSIS — G9341 Metabolic encephalopathy: Secondary | ICD-10-CM | POA: Diagnosis not present

## 2022-04-07 DIAGNOSIS — F03B11 Unspecified dementia, moderate, with agitation: Secondary | ICD-10-CM | POA: Diagnosis not present

## 2022-04-07 DIAGNOSIS — A419 Sepsis, unspecified organism: Secondary | ICD-10-CM | POA: Diagnosis not present

## 2022-04-08 LAB — CULTURE, BLOOD (ROUTINE X 2)
Special Requests: ADEQUATE
Special Requests: ADEQUATE

## 2022-04-15 ENCOUNTER — Inpatient Hospital Stay: Payer: Medicare Other

## 2022-04-15 ENCOUNTER — Inpatient Hospital Stay: Payer: Medicare Other | Admitting: Hematology

## 2022-04-23 NOTE — Progress Notes (Signed)
Patient expired at 2025 on 7/16/20223. Noted pulseless with no respirations, verified by Orbie Pyo, RN and Hortencia Conradi, RN. Charge nurse, [facility] and MD on-call Dr. Hal Hope notfiied.

## 2022-04-23 NOTE — Death Summary Note (Signed)
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)   Hospital Course: Mrs. Levins is an 81 y.o. F with autoimmune hemolytic anemia cold agglutinin disease, transfusion dependent q2weeks, baseline Hgb 7 g/dL, COPD and chronic respiratory failure on 2L, dementia, SNF-resident who presented with encephalopathy and hypotension.  Prior to admission, patient had developed fevera dn maybe some respiratory symptoms, but on the morning of admission, she was acutely suddenly worse.  In the ER blood pressure was 70/36, tachycardic with lactic acidosis.  Creatinine was tripled from baseline, LFTs were greater than thousand and she was unresponsive.  Blood cultures subsequently grew E coli in 2/2.  Goals of care were discussed with patient's two sisters, and they reiterated she would not have wanted extraordinary measures but would want to be comfortable and allow a natural death.  She was kept clean and dry, provided with medications for comfort and passed on 10-Apr-2022 at 8:25pm surrounded by her closest living family.               The results of significant diagnostics from this hospitalization (including imaging, microbiology, ancillary and laboratory) are listed below for reference.   Significant Diagnostic Studies: DG Chest Port 1 View  Result Date: 04/01/2022 CLINICAL DATA:   Cough EXAM: PORTABLE CHEST 1 VIEW COMPARISON:  11/05/2021 FINDINGS: Significantly rotated. Increased density at the right lung base. Mild elevation of right hemidiaphragm. Chronic interstitial changes. Similar cardiomediastinal contours with cardiomegaly. No significant pleural effusion. No pneumothorax. IMPRESSION: Increased density at the right lung base favored to reflect atelectasis given elevation of the diaphragm. Electronically Signed   By: Macy Mis M.D.   On: 03/27/2022 14:02    Microbiology: Recent Results (from the past 240 hour(s))  Culture, blood (Routine X 2) w Reflex to ID Panel     Status: Abnormal (Preliminary result)   Collection Time: 07-Apr-2022  1:24 PM   Specimen: BLOOD  Result Value Ref Range Status   Specimen Description   Final    BLOOD SITE NOT SPECIFIED Performed at [facility] [address] [place], [place] [address]    Special Requests   Final    BOTTLES DRAWN AEROBIC AND ANAEROBIC Blood Culture adequate volume Performed at [facility] [address] [place], [place] [address]    Culture  Setup Time   Final    GRAM NEGATIVE RODS IN BOTH AEROBIC AND ANAEROBIC BOTTLES CRITICAL RESULT CALLED TO, READ BACK BY AND VERIFIED WITH: C. SHADE PHARMD, AT 8916 04/05/22 D. VANHOOK    Culture (A)  Final    ESCHERICHIA COLI REPEATING SENSITIVITY Performed at [facility], [place] [address] [place], [place] [address]    Report Status PENDING  Incomplete  Culture, blood (Routine X 2) w Reflex to ID Panel     Status: Abnormal (Preliminary result)   Collection Time: Apr 07, 2022  1:24 PM   Specimen: BLOOD  Result Value Ref Range Status   Specimen Description   Final    BLOOD BLOOD RIGHT ARM Performed at [facility] [address] [place], [place] [address]    Special Requests   Final    BOTTLES DRAWN AEROBIC AND ANAEROBIC Blood Culture adequate volume UPPER R ARM Performed at [facility] [address], [place], [place] [address]    Culture  Setup Time   Final    GRAM NEGATIVE RODS IN BOTH AEROBIC AND ANAEROBIC BOTTLES CRITICAL VALUE NOTED.  VALUE IS CONSISTENT WITH PREVIOUSLY REPORTED AND CALLED VALUE. Performed at [facility], [place] [address], [place], [place] [address]    Culture ESCHERICHIA COLI (A)  Final   Report Status PENDING  Incomplete  SARS Coronavirus 2 by RT PCR (hospital order, performed in [facility] hospital lab) *cepheid single result test*     Status: None   Collection Time: 04/22/2022  1:24 PM   Specimen: Nasal Swab  Result Value Ref Range Status   SARS Coronavirus 2 by RT PCR NEGATIVE NEGATIVE Final    Comment: (NOTE) SARS-CoV-2 target nucleic acids are NOT DETECTED.  The SARS-CoV-2 RNA is generally detectable in upper and lower respiratory specimens during the acute phase of infection. The lowest concentration of SARS-CoV-2 viral copies this assay can detect is 250 copies / mL. A negative result does not preclude SARS-CoV-2 infection and should not be used as the sole basis for treatment or other patient management decisions.  A negative result may occur with improper specimen collection / handling, submission of specimen other than nasopharyngeal swab, presence of viral mutation(s) within the areas targeted by this assay, and inadequate number of viral copies (<250 copies / mL). A negative result must be combined with clinical observations, patient history, and epidemiological information.  Fact Sheet for Patients:   https://www.patel.info/  Fact Sheet for Healthcare Providers: https://hall.com/  This test is not yet approved or  cleared by the [place] FDA and has been authorized for detection and/or diagnosis of SARS-CoV-2 by FDA under an Emergency Use Authorization (EUA).  This EUA will remain in effect (meaning this test can be used) for the duration of the COVID-19  declaration under Section 564(b)(1) of the Act, 21 U.S.C. section 360bbb-3(b)(1), unless the authorization is terminated or revoked sooner.  Performed at [facility], [place] [address] [place], [place] [address]   Blood Culture ID Panel (Reflexed)     Status: Abnormal   Collection Time: 04/14/2022  1:24 PM  Result Value Ref Range Status   Enterococcus faecalis NOT DETECTED NOT DETECTED Final   Enterococcus Faecium NOT DETECTED NOT DETECTED Final   Listeria monocytogenes NOT DETECTED NOT DETECTED Final   Staphylococcus species NOT DETECTED NOT DETECTED Final   Staphylococcus aureus (BCID) NOT DETECTED NOT DETECTED Final   Staphylococcus epidermidis NOT DETECTED NOT DETECTED Final   Staphylococcus lugdunensis NOT DETECTED NOT DETECTED Final   Streptococcus species NOT DETECTED NOT DETECTED Final   Streptococcus agalactiae NOT DETECTED NOT DETECTED Final   Streptococcus pneumoniae NOT DETECTED NOT DETECTED Final   Streptococcus pyogenes NOT DETECTED NOT DETECTED Final   A.calcoaceticus-baumannii NOT DETECTED NOT DETECTED Final   Bacteroides fragilis NOT DETECTED NOT DETECTED Final   Enterobacterales DETECTED (A) NOT DETECTED Final    Comment: Enterobacterales represent a large order of gram negative bacteria, not a single organism. CRITICAL RESULT CALLED TO, READ BACK BY AND VERIFIED WITH:  C. SHADE PHARMD, AT 1021 04/05/22 D. VANHOOK    Enterobacter cloacae complex NOT DETECTED NOT DETECTED Final   Escherichia coli DETECTED (A) NOT DETECTED Final    Comment: CRITICAL RESULT CALLED TO, READ BACK BY AND VERIFIED WITH: C. SHADE PHARMD, AT 1173 04/05/22 D. VANHOOK    Klebsiella aerogenes NOT DETECTED NOT DETECTED Final   Klebsiella oxytoca NOT DETECTED NOT DETECTED Final   Klebsiella pneumoniae NOT DETECTED NOT DETECTED Final   Proteus species NOT DETECTED NOT DETECTED Final   Salmonella species NOT DETECTED NOT DETECTED Final   Serratia marcescens NOT DETECTED NOT  DETECTED Final   Haemophilus influenzae NOT DETECTED NOT DETECTED Final   Neisseria meningitidis NOT DETECTED NOT DETECTED Final   Pseudomonas aeruginosa NOT DETECTED NOT DETECTED Final   Stenotrophomonas maltophilia NOT DETECTED NOT DETECTED Final   Candida albicans NOT DETECTED NOT DETECTED Final   Candida auris NOT DETECTED NOT DETECTED Final   Candida glabrata NOT DETECTED NOT DETECTED Final   Candida krusei NOT DETECTED NOT DETECTED Final   Candida parapsilosis NOT DETECTED NOT DETECTED Final   Candida tropicalis NOT DETECTED NOT DETECTED Final   Cryptococcus neoformans/gattii NOT DETECTED NOT DETECTED Final   CTX-M ESBL NOT DETECTED NOT DETECTED Final   Carbapenem resistance IMP NOT DETECTED NOT DETECTED Final   Carbapenem resistance KPC NOT DETECTED NOT DETECTED Final   Carbapenem resistance NDM NOT DETECTED NOT DETECTED Final   Carbapenem resist OXA 48 LIKE NOT DETECTED NOT DETECTED Final   Carbapenem resistance VIM NOT DETECTED NOT DETECTED Final    Comment: Performed at [facility], [address], [place], [place] [address]  MRSA Next Gen by PCR, Nasal     Status: None   Collection Time: 03/30/2022  5:51 PM   Specimen: Nasal Mucosa; Nasal Swab  Result Value Ref Range Status   MRSA by PCR Next Gen NOT DETECTED NOT DETECTED Final    Comment: (NOTE) The GeneXpert MRSA Assay (FDA approved for NASAL specimens only), is one component of a comprehensive MRSA colonization surveillance program. It is not intended to diagnose MRSA infection nor to guide or monitor treatment for MRSA infections. Test performance is not FDA approved in patients less than 47 years old. Performed at [facility], [place] [address], [place], [place] [address]      Signed: Edwin Dada, MD 2022/05/03

## 2022-04-23 NOTE — Progress Notes (Signed)
  Progress Note   Patient: [name] RCB:[id number] DOB: [birth year] DOA: 03/24/2022     3 DOS: the patient was seen and examined on 04/18/22       Brief hospital course: [name] is an 81 y.o. F with autoimmune hemolytic anemia cold agglutinin disease, transfusion dependent q2weeks, baseline Hgb 7 g/dL, COPD and chronic respiratory failure on 2L, dementia, SNF-resident who presented with encephalopathy and hypotension.  In the ER, the patient was found to have multiorgan failure, hypotension.  Goals of care were discussed with family, and the patient was made comfort care.  Overnight blood cultures grew E. coli.    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  Has not open eyes or made any responses to family, no oral intake - Allow natural death  - Family have declined residential hospice         Subjective: Patient unresponsive, has had a fever overnight, restless at times, comes with morphine     Physical Exam: Vitals:   Apr 18, 2022 0029 04-18-22 0302 2022-04-18 0555 04-18-22 0600  BP:   (!) 120/42   Pulse:   (!) 122 (!) 105  Resp:   (!) 44 (!) 22  Temp: (!) 103.2 F (39.6 C) (!) 101.1 F (38.4 C) (!) 102.9 F (39.4 C)   TempSrc: Axillary  Axillary   SpO2:   93%   Weight:      Height:       Cachectic elderly female, lying on her side, appears moribund, jaundiced Heart sounds tachycardic, faint, no peripheral edema Respiratory rate labored and regular, no rales or wheezing No spontaneous movements, no spontaneous verbalizations, does not open the eyes      Family Communication: Sister and mother at  bedside    Disposition: Status is: Inpatient Expect in hospital death.          Author: Edwin Dada, MD 04-18-2022 2:55 PM  For on call review [email or web address].

## 2022-04-23 DEATH — deceased

## 2022-12-18 IMAGING — CR DG HIP (WITH OR WITHOUT PELVIS) 2-3V*L*
3 series · 3 of 3 positions shown · non-contrast
Comparison: None Available.

CLINICAL DATA: Fall?, complains of diffuse left hip pain. Prior
surgery to both hips.

EXAM:
DG HIP (WITH OR WITHOUT PELVIS) 2-3V LEFT

[x pelvis]
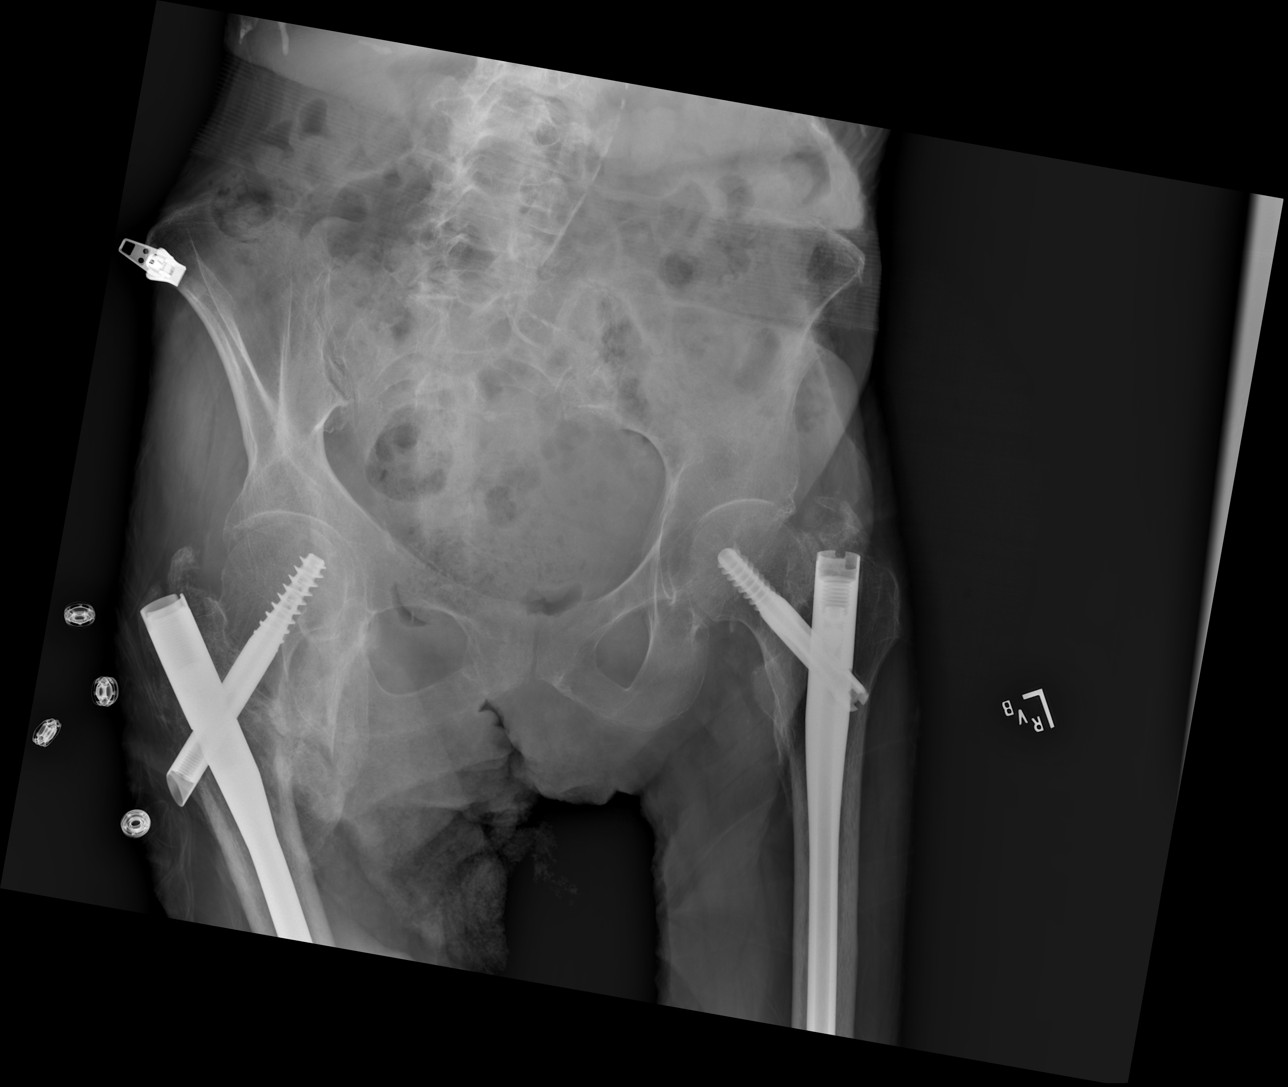

[x hip ap left]
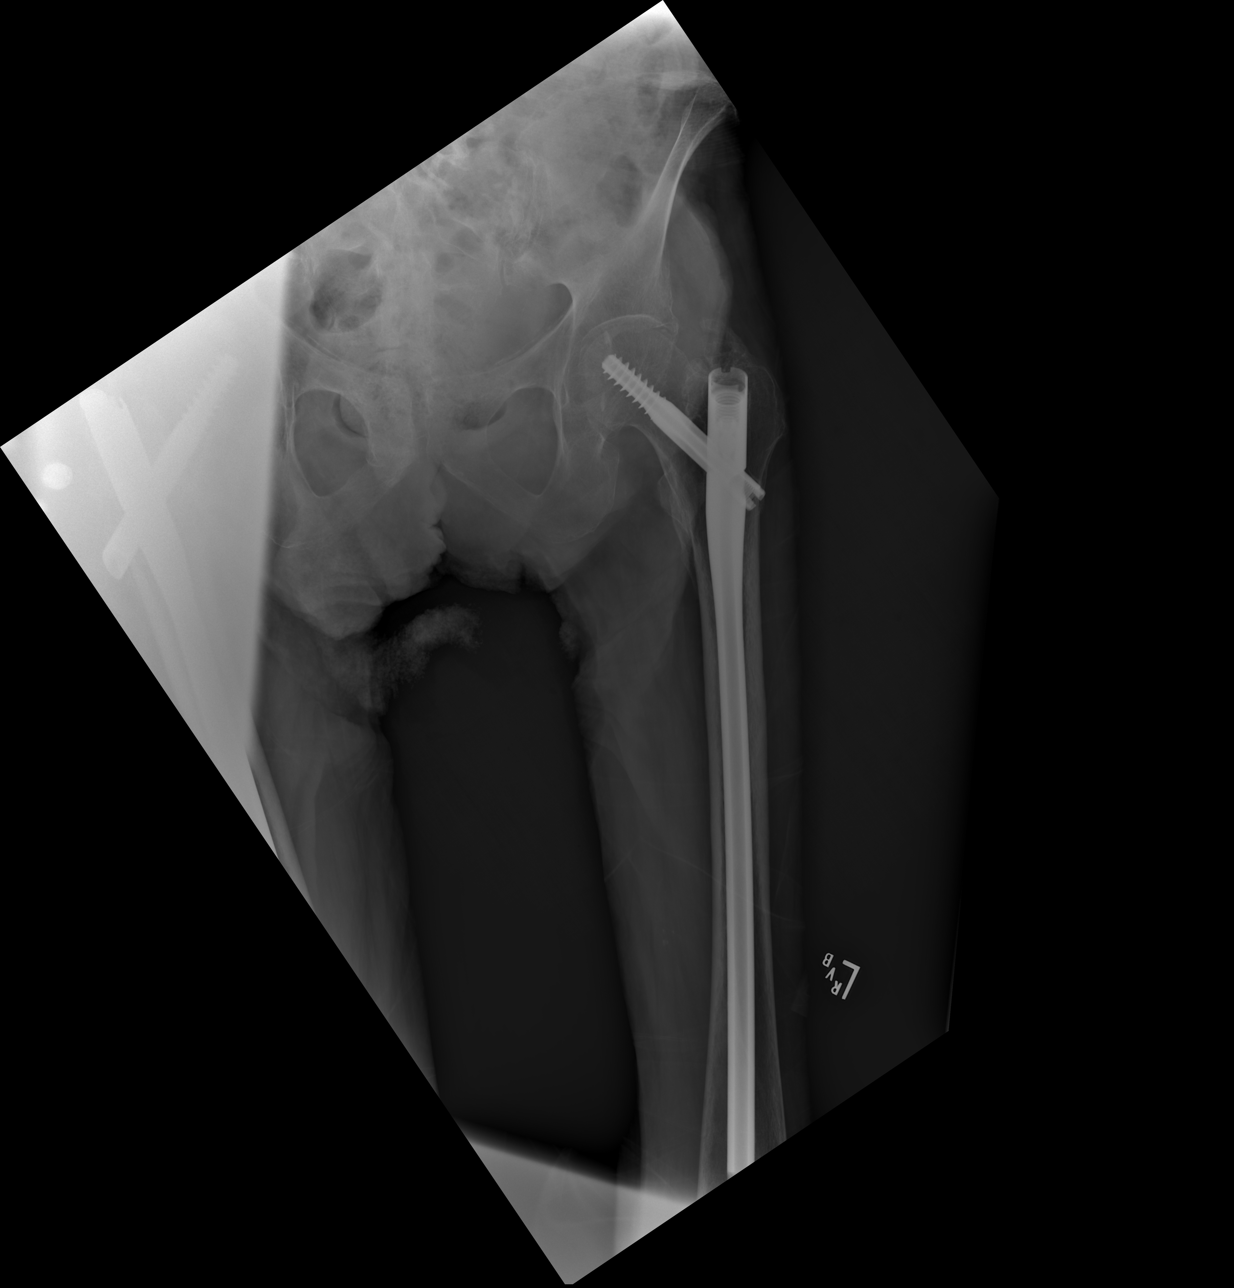

[x hip lat left]
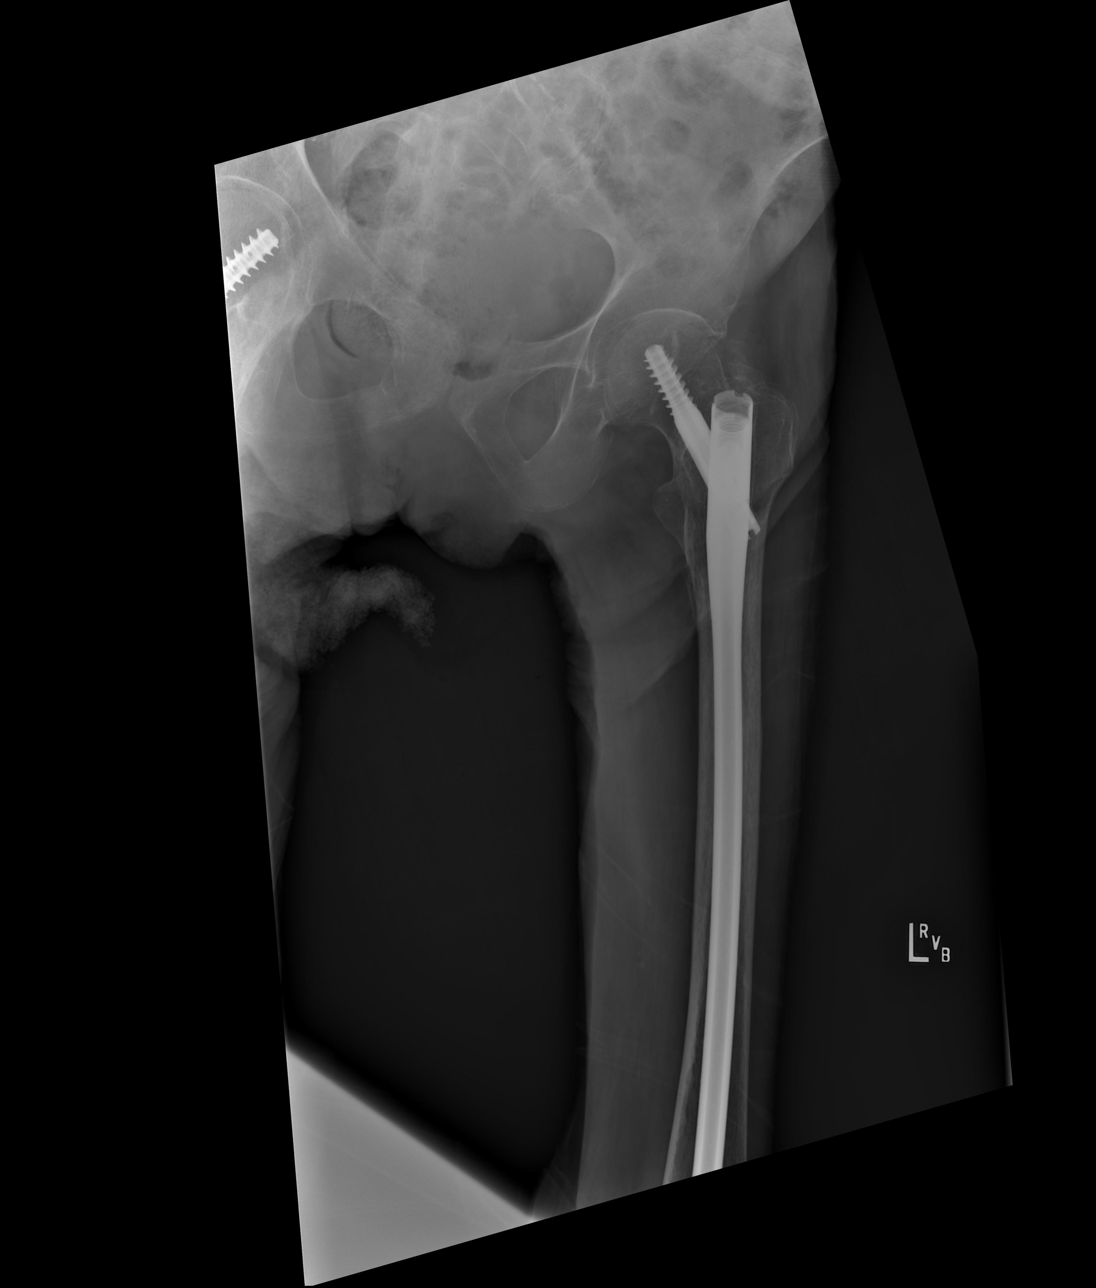

[3 of 3 positions shown; findings below may reference images not displayed]

FINDINGS: Status post bilateral cephalomedullary nail placement. No
perihardware loosening or evidence of periprosthetic fracture.
Diffuse osteopenia.
IMPRESSION: Status post bilateral cephalomedullary nail placement without
evidence of acute periprosthetic fracture or loosening.
